# Patient Record
Sex: Male | Born: 1940
Health system: Southern US, Community
[De-identification: ages and names within clinical notes are randomized; demographics above are authoritative.]

## PROBLEM LIST (undated history)

## (undated) DIAGNOSIS — I10 Essential (primary) hypertension: Secondary | ICD-10-CM

## (undated) DIAGNOSIS — I480 Paroxysmal atrial fibrillation: Secondary | ICD-10-CM

## (undated) DIAGNOSIS — N400 Enlarged prostate without lower urinary tract symptoms: Secondary | ICD-10-CM

## (undated) DIAGNOSIS — R7989 Other specified abnormal findings of blood chemistry: Secondary | ICD-10-CM

## (undated) DIAGNOSIS — C4491 Basal cell carcinoma of skin, unspecified: Secondary | ICD-10-CM

## (undated) DIAGNOSIS — B069 Rubella without complication: Secondary | ICD-10-CM

## (undated) DIAGNOSIS — D696 Thrombocytopenia, unspecified: Secondary | ICD-10-CM

## (undated) DIAGNOSIS — D649 Anemia, unspecified: Secondary | ICD-10-CM

## (undated) DIAGNOSIS — F419 Anxiety disorder, unspecified: Secondary | ICD-10-CM

## (undated) DIAGNOSIS — E785 Hyperlipidemia, unspecified: Secondary | ICD-10-CM

## (undated) DIAGNOSIS — K219 Gastro-esophageal reflux disease without esophagitis: Secondary | ICD-10-CM

## (undated) DIAGNOSIS — M1990 Unspecified osteoarthritis, unspecified site: Secondary | ICD-10-CM

## (undated) DIAGNOSIS — T7840XA Allergy, unspecified, initial encounter: Secondary | ICD-10-CM

## (undated) DIAGNOSIS — H6120 Impacted cerumen, unspecified ear: Secondary | ICD-10-CM

## (undated) DIAGNOSIS — I639 Cerebral infarction, unspecified: Secondary | ICD-10-CM

## (undated) DIAGNOSIS — H269 Unspecified cataract: Secondary | ICD-10-CM

## (undated) DIAGNOSIS — J45909 Unspecified asthma, uncomplicated: Secondary | ICD-10-CM

## (undated) DIAGNOSIS — I4891 Unspecified atrial fibrillation: Secondary | ICD-10-CM

## (undated) DIAGNOSIS — K648 Other hemorrhoids: Secondary | ICD-10-CM

## (undated) DIAGNOSIS — F329 Major depressive disorder, single episode, unspecified: Secondary | ICD-10-CM

## (undated) DIAGNOSIS — Z0000 Encounter for general adult medical examination without abnormal findings: Secondary | ICD-10-CM

## (undated) DIAGNOSIS — G4730 Sleep apnea, unspecified: Secondary | ICD-10-CM

## (undated) DIAGNOSIS — B019 Varicella without complication: Secondary | ICD-10-CM

## (undated) DIAGNOSIS — H3530 Unspecified macular degeneration: Secondary | ICD-10-CM

## (undated) DIAGNOSIS — F411 Generalized anxiety disorder: Secondary | ICD-10-CM

## (undated) DIAGNOSIS — H6090 Unspecified otitis externa, unspecified ear: Secondary | ICD-10-CM

## (undated) DIAGNOSIS — G709 Myoneural disorder, unspecified: Secondary | ICD-10-CM

## (undated) HISTORY — DX: Rubella without complication: B06.9

## (undated) HISTORY — DX: Thrombocytopenia, unspecified: D69.6

## (undated) HISTORY — DX: Generalized anxiety disorder: F41.1

## (undated) HISTORY — DX: Sleep apnea, unspecified: G47.30

## (undated) HISTORY — DX: Other hemorrhoids: K64.8

## (undated) HISTORY — DX: Encounter for general adult medical examination without abnormal findings: Z00.00

## (undated) HISTORY — DX: Cerebral infarction, unspecified: I63.9

## (undated) HISTORY — DX: Essential (primary) hypertension: I10

## (undated) HISTORY — DX: Anxiety disorder, unspecified: F41.9

## (undated) HISTORY — DX: Unspecified cataract: H26.9

## (undated) HISTORY — DX: Paroxysmal atrial fibrillation: I48.0

## (undated) HISTORY — PX: EYE SURGERY: SHX253

## (undated) HISTORY — DX: Anemia, unspecified: D64.9

## (undated) HISTORY — DX: Unspecified asthma, uncomplicated: J45.909

## (undated) HISTORY — DX: Basal cell carcinoma of skin, unspecified: C44.91

## (undated) HISTORY — DX: Varicella without complication: B01.9

## (undated) HISTORY — DX: Allergy, unspecified, initial encounter: T78.40XA

## (undated) HISTORY — DX: Unspecified macular degeneration: H35.30

## (undated) HISTORY — DX: Major depressive disorder, single episode, unspecified: F32.9

## (undated) HISTORY — DX: Other specified abnormal findings of blood chemistry: R79.89

## (undated) HISTORY — PX: UPPER GASTROINTESTINAL ENDOSCOPY: SHX188

## (undated) HISTORY — PX: BELPHAROPTOSIS REPAIR: SHX369

## (undated) HISTORY — PX: SKIN CANCER EXCISION: SHX779

## (undated) HISTORY — DX: Impacted cerumen, unspecified ear: H61.20

## (undated) HISTORY — DX: Unspecified otitis externa, unspecified ear: H60.90

## (undated) HISTORY — DX: Unspecified osteoarthritis, unspecified site: M19.90

## (undated) HISTORY — DX: Myoneural disorder, unspecified: G70.9

## (undated) HISTORY — PX: EXCISIONAL HEMORRHOIDECTOMY: SHX1541

---

## 2010-10-07 HISTORY — PX: HYDROCELE EXCISION / REPAIR: SUR1145

## 2011-04-22 ENCOUNTER — Other Ambulatory Visit: Payer: Self-pay | Admitting: Urology

## 2011-04-22 ENCOUNTER — Encounter (HOSPITAL_COMMUNITY): Payer: Medicare Other

## 2011-04-22 ENCOUNTER — Other Ambulatory Visit (HOSPITAL_COMMUNITY): Payer: Self-pay | Admitting: Urology

## 2011-04-22 ENCOUNTER — Ambulatory Visit (HOSPITAL_COMMUNITY)
Admission: RE | Admit: 2011-04-22 | Discharge: 2011-04-22 | Disposition: A | Discharge status: Home / Self Care | Payer: Medicare Other | Source: Ambulatory Visit | Attending: Urology | Admitting: Urology

## 2011-04-22 DIAGNOSIS — Z87891 Personal history of nicotine dependence: Secondary | ICD-10-CM | POA: Insufficient documentation

## 2011-04-22 DIAGNOSIS — I10 Essential (primary) hypertension: Secondary | ICD-10-CM | POA: Insufficient documentation

## 2011-04-22 DIAGNOSIS — Z01818 Encounter for other preprocedural examination: Secondary | ICD-10-CM | POA: Insufficient documentation

## 2011-04-22 DIAGNOSIS — Z01812 Encounter for preprocedural laboratory examination: Secondary | ICD-10-CM | POA: Insufficient documentation

## 2011-04-22 DIAGNOSIS — I498 Other specified cardiac arrhythmias: Secondary | ICD-10-CM | POA: Insufficient documentation

## 2011-04-22 DIAGNOSIS — Z01810 Encounter for preprocedural cardiovascular examination: Secondary | ICD-10-CM | POA: Insufficient documentation

## 2011-04-22 LAB — CBC
MCH: 30.2 pg (ref 26.0–34.0)
MCHC: 33.3 g/dL (ref 30.0–36.0)
Platelets: 125 10*3/uL — ABNORMAL LOW (ref 150–400)

## 2011-04-22 LAB — SURGICAL PCR SCREEN
MRSA, PCR: NEGATIVE
Staphylococcus aureus: NEGATIVE

## 2011-04-22 LAB — BASIC METABOLIC PANEL
Calcium: 9.4 mg/dL (ref 8.4–10.5)
GFR calc non Af Amer: >60 mL/min (ref >60)
Sodium: 137 mEq/L (ref 135–145)

## 2011-04-23 ENCOUNTER — Ambulatory Visit (HOSPITAL_COMMUNITY)
Admission: RE | Admit: 2011-04-23 | Discharge: 2011-04-23 | Disposition: A | Discharge status: Home / Self Care | Payer: Medicare Other | Source: Ambulatory Visit | Attending: Urology | Admitting: Urology

## 2011-04-23 DIAGNOSIS — Z7982 Long term (current) use of aspirin: Secondary | ICD-10-CM | POA: Insufficient documentation

## 2011-04-23 DIAGNOSIS — Z01810 Encounter for preprocedural cardiovascular examination: Secondary | ICD-10-CM | POA: Insufficient documentation

## 2011-04-23 DIAGNOSIS — N433 Hydrocele, unspecified: Secondary | ICD-10-CM | POA: Insufficient documentation

## 2011-04-23 DIAGNOSIS — Z01812 Encounter for preprocedural laboratory examination: Secondary | ICD-10-CM | POA: Insufficient documentation

## 2011-04-23 DIAGNOSIS — I10 Essential (primary) hypertension: Secondary | ICD-10-CM | POA: Insufficient documentation

## 2011-04-23 DIAGNOSIS — Z79899 Other long term (current) drug therapy: Secondary | ICD-10-CM | POA: Insufficient documentation

## 2011-05-11 NOTE — Op Note (Signed)
  NAMECECILIA, Walter Brown NO.:  [id number]  MEDICAL RECORD NO.:  [id number]  LOCATION:  [facility]                         FACILITY:  [facility]  PHYSICIAN:  Danae Chen, M.D.  DATE OF BIRTH:  06/24/1941  DATE OF PROCEDURE:  04/23/2011 DATE OF DISCHARGE:                              OPERATIVE REPORT   PREOPERATIVE DIAGNOSIS:  Bilateral hydrocele.  POSTOPERATIVE DIAGNOSIS:  Bilateral hydrocele.  PROCEDURE DONE:  Bilateral hydrocelectomy.  SURGEON:  Danae Chen, M.D.  ANESTHESIA:  General.  INDICATIONS:  The patient is a 70 year old male who has had bilateral hydroceles for several years.  Recently, the hydrocele had been increasing in size.  They have been causing him some discomfort and he wanted to have a hydrocelectomy.  He is scheduled today for the procedure.  The patient was identified by his wristband and proper time-out was taken.  Under general anesthesia, he was prepped and draped and placed in the supine position.  A longitudinal incision was made on the right scrotum. The incision was carried down to the tunica vaginalis which was then incised.  250 mL of clear fluid were drained out of the hydrocele sac. Then, tunica vaginalis was imbricated with #3-0 chromic using the Lord's technique.  The wound was then irrigated with normal saline.  Then, the testicle was replaced in the scrotum and the wound was closed in two layers with #3-0 chromic.  Then, a longitudinal incision was made on the left scrotum.  The incision was carried down to the tunica vaginalis which was also incised.  100 mL of fluid were drained out of the hydrocele sac.  Then using the Lord's technique, the tunica vaginalis was plicated using #3-0 chromic.  The testicle was then replaced in the scrotum.  The wound was then closed with #3-0 chromic. Sterile dressing was then applied to the wound.  The patient tolerated the procedure well and left the OR in satisfactory condition to post  anesthesia care unit.     Danae Chen, M.D.     MN/MEDQ  D:  04/23/2011  T:  04/23/2011  Job:  [id number]  Electronically Signed by Lindaann Slough M.D. on 05/11/2011 07:37:31 AM

## 2012-07-02 ENCOUNTER — Emergency Department (HOSPITAL_BASED_OUTPATIENT_CLINIC_OR_DEPARTMENT_OTHER): Payer: Medicare Other

## 2012-07-02 ENCOUNTER — Encounter (HOSPITAL_BASED_OUTPATIENT_CLINIC_OR_DEPARTMENT_OTHER): Payer: Self-pay | Admitting: Emergency Medicine

## 2012-07-02 ENCOUNTER — Emergency Department (HOSPITAL_BASED_OUTPATIENT_CLINIC_OR_DEPARTMENT_OTHER)
Admission: EM | Admit: 2012-07-02 | Discharge: 2012-07-02 | Disposition: A | Discharge status: Home / Self Care | Payer: Medicare Other | Attending: Emergency Medicine | Admitting: Emergency Medicine

## 2012-07-02 DIAGNOSIS — R51 Headache: Secondary | ICD-10-CM

## 2012-07-02 DIAGNOSIS — I10 Essential (primary) hypertension: Secondary | ICD-10-CM | POA: Insufficient documentation

## 2012-07-02 HISTORY — DX: Benign prostatic hyperplasia without lower urinary tract symptoms: N40.0

## 2012-07-02 HISTORY — DX: Essential (primary) hypertension: I10

## 2012-07-02 HISTORY — DX: Hyperlipidemia, unspecified: E78.5

## 2012-07-02 LAB — URINALYSIS, ROUTINE W REFLEX MICROSCOPIC
Bilirubin Urine: NEGATIVE
Glucose, UA: NEGATIVE mg/dL
Ketones, ur: NEGATIVE mg/dL
pH: 6 (ref 5.0–8.0)

## 2012-07-02 LAB — COMPREHENSIVE METABOLIC PANEL
ALT: 18 U/L (ref 0–53)
AST: 24 U/L (ref 0–37)
Albumin: 3.9 g/dL (ref 3.5–5.2)
Alkaline Phosphatase: 68 U/L (ref 39–117)
Potassium: 3.7 mEq/L (ref 3.5–5.1)
Sodium: 139 mEq/L (ref 135–145)
Total Protein: 6.8 g/dL (ref 6.0–8.3)

## 2012-07-02 LAB — CBC WITH DIFFERENTIAL/PLATELET
Basophils Relative: 1 % (ref 0–1)
Eosinophils Absolute: 0.1 10*3/uL (ref 0.0–0.7)
Lymphs Abs: 1.6 10*3/uL (ref 0.7–4.0)
MCH: 30.8 pg (ref 26.0–34.0)
Neutrophils Relative %: 73 % (ref 43–77)
Platelets: 137 10*3/uL — ABNORMAL LOW (ref 150–400)
RBC: 4.35 MIL/uL (ref 4.22–5.81)

## 2012-07-02 NOTE — ED Provider Notes (Signed)
History     CSN: [id number]  Arrival date & time 07/02/12  1500   First MD Initiated Contact with Patient 07/02/12 1521      Chief Complaint  Patient presents with  . Hypertension     HPI  The patient p/w concerns of HTN.  He has been in his [facility] prior to this AM.  He awoke feeling slightly "disoriented."  (~8hr pta). A BP check was abnormally high.  He went to the gym, completed a strenuous workout, and on recheck his BP was normal.  During this period he became aware of a mild HA (diffuse, sore) with no visual changes / dysequilibrium / weakness or other focal changes.  Just PTA he checked his BP and again it was elevated.   Past Medical History  Diagnosis Date  . Hypertension   . Hyperlipidemia   . BPH (benign prostatic hyperplasia)     Past Surgical History  Procedure Date  . Hydrocele excision / repair     No family history on file.  History  Substance Use Topics  . Smoking status: Not on file  . Smokeless tobacco: Not on file  . Alcohol Use: Yes      Review of Systems  Constitutional:       Per HPI, otherwise negative  HENT:       Per HPI, otherwise negative  Eyes: Negative.   Respiratory:       Per HPI, otherwise negative  Cardiovascular:       Per HPI, otherwise negative  Gastrointestinal: Negative for vomiting.  Genitourinary: Negative.   Musculoskeletal:       Per HPI, otherwise negative  Skin: Negative.   Neurological: Positive for headaches. Negative for dizziness, tremors, seizures, syncope, facial asymmetry, speech difficulty, weakness, light-headedness and numbness.    Allergies  Review of patient's allergies indicates no known allergies.  Home Medications   Current Outpatient Rx  Name Route Sig Dispense Refill  . ESCITALOPRAM OXALATE 20 MG PO TABS Oral Take 20 mg by mouth daily.    . TYROSINE PO Oral Take 20 mg by mouth.      BP 155/94  Pulse 87  Temp 98.3 F (36.8 C) (Oral)  Resp 16  Ht 5\' 6"  (1.676 m)  Wt 180 lb (81.647 kg)   BMI 29.05 kg/m2  SpO2 100%  Physical Exam  Nursing note and vitals reviewed. Constitutional: He is oriented to person, place, and time. He appears well-developed. No distress.  HENT:  Head: Normocephalic and atraumatic.  Eyes: Conjunctivae normal and EOM are normal.  Cardiovascular: Normal rate and regular rhythm.   Pulmonary/Chest: Effort normal. No stridor. No respiratory distress.  Abdominal: He exhibits no distension.  Musculoskeletal: He exhibits no edema.  Neurological: He is alert and oriented to person, place, and time. He displays no atrophy and no tremor. No cranial nerve deficit. He exhibits normal muscle tone. He displays no seizure activity. Coordination and gait normal.  Skin: Skin is warm and dry.  Psychiatric: He has a normal mood and affect.    ED Course  Procedures (including critical care time)  Labs Reviewed  CBC WITH DIFFERENTIAL - Abnormal; Notable for the following:    HCT 38.2 (*)     Platelets 137 (*)     All other components within normal limits  COMPREHENSIVE METABOLIC PANEL  URINALYSIS, ROUTINE W REFLEX MICROSCOPIC   Dg Chest 2 View  07/02/2012  *RADIOLOGY REPORT*  Clinical Data: Discomfort, hypertension.  CHEST - 2 VIEW  Comparison: 04/22/2011  Findings: Mild tortuosity of the thoracic aorta. Heart and mediastinal contours are within normal limits.  No focal opacities or effusions.  No acute bony abnormality.  IMPRESSION: No active cardiopulmonary disease.   Original Report Authenticated By: Cyndie Chime, M.D.      No diagnosis found.   Date: 07/02/2012  Rate: 72  Rhythm: sinus arrhythmia  QRS Axis: normal  Intervals: normal  ST/T Wave abnormalities: normal  Conduction Disutrbances:none  Narrative Interpretation:   Old EKG Reviewed: changes noted ABNORMAL  Repeat blood pressure is 160 systolic MDM  This generally well-appearing male presents with concerns of hypertension and a new headache.  The patient's generally good health, the  absence of distress, the absence of focal neuro findings is all reassuring.  However, given his description of abnormally elevated blood pressure there is some suspicion of acute hypertensive urgency.  Patient's labs, CAT scan, radiographic studies were unremarkable, and he remained in no distress throughout his ED stay.  The patient has a followup appointment scheduled in 15 hours.  Given this, the absence of distress or notable findings, the patient was discharged in stable condition with return precautions, next a followup.   Gerhard Munch, MD 07/02/12 [phone]

## 2012-07-02 NOTE — ED Notes (Signed)
States has been having elevated b/p's recently.  This am was 176/92 so he went to work out.  It came down to 128/88 but went right back up soon after lunch.

## 2012-07-02 NOTE — ED Notes (Signed)
Pt c/o of a slight headache. Denies dizziness, n/v.

## 2012-07-03 ENCOUNTER — Ambulatory Visit (INDEPENDENT_AMBULATORY_CARE_PROVIDER_SITE_OTHER): Payer: Medicare Other | Admitting: Family Medicine

## 2012-07-03 ENCOUNTER — Encounter: Payer: Self-pay | Admitting: Family Medicine

## 2012-07-03 VITALS — BP 164/90 | HR 66 | Temp 97.8°F | Ht 66.0 in | Wt 183.0 lb

## 2012-07-03 DIAGNOSIS — I10 Essential (primary) hypertension: Secondary | ICD-10-CM

## 2012-07-03 DIAGNOSIS — E785 Hyperlipidemia, unspecified: Secondary | ICD-10-CM

## 2012-07-03 DIAGNOSIS — C801 Malignant (primary) neoplasm, unspecified: Secondary | ICD-10-CM | POA: Insufficient documentation

## 2012-07-03 DIAGNOSIS — C4491 Basal cell carcinoma of skin, unspecified: Secondary | ICD-10-CM | POA: Insufficient documentation

## 2012-07-03 DIAGNOSIS — J45909 Unspecified asthma, uncomplicated: Secondary | ICD-10-CM

## 2012-07-03 DIAGNOSIS — N400 Enlarged prostate without lower urinary tract symptoms: Secondary | ICD-10-CM

## 2012-07-03 DIAGNOSIS — H6690 Otitis media, unspecified, unspecified ear: Secondary | ICD-10-CM

## 2012-07-03 DIAGNOSIS — D696 Thrombocytopenia, unspecified: Secondary | ICD-10-CM | POA: Insufficient documentation

## 2012-07-03 DIAGNOSIS — D649 Anemia, unspecified: Secondary | ICD-10-CM

## 2012-07-03 MED ORDER — HYDROCHLOROTHIAZIDE 25 MG PO TABS: 25.0000 mg | ORAL_TABLET | Freq: Every day | ORAL | Status: DC

## 2012-07-03 MED ORDER — AMOXICILLIN-POT CLAVULANATE 875-125 MG PO TABS: 1.0000 | ORAL_TABLET | Freq: Two times a day (BID) | ORAL | Status: DC

## 2012-07-03 NOTE — Patient Instructions (Addendum)
Preventive Care for Adults, Male A healthy lifestyle and preventative care can promote health and wellness. Preventative health guidelines for men include the following key practices:  A routine yearly physical is a good way to check with your caregiver about your health and preventative screening. It is a chance to share any concerns and updates on your health, and to receive a thorough exam.   Visit your dentist for a routine exam and preventative care every 6 months. Brush your teeth twice a day and floss once a day. Good oral hygiene prevents tooth decay and gum disease.   The frequency of eye exams is based on your age, health, family medical history, use of contact lenses, and other factors. Follow your caregiver's recommendations for frequency of eye exams.   Eat a healthy diet. Foods like vegetables, fruits, whole grains, low-fat dairy products, and lean protein foods contain the nutrients you need without too many calories. Decrease your intake of foods high in solid fats, added sugars, and salt. Eat the right amount of calories for you.Get information about a proper diet from your caregiver, if necessary.   Regular physical exercise is one of the most important things you can do for your health. Most adults should get at least 150 minutes of moderate-intensity exercise (any activity that increases your heart rate and causes you to sweat) each week. In addition, most adults need muscle-strengthening exercises on 2 or more days a week.   Maintain a healthy weight. The body mass index (BMI) is a screening tool to identify possible weight problems. It provides an estimate of body fat based on height and weight. Your caregiver can help determine your BMI, and can help you achieve or maintain a healthy weight.For adults 20 years and older:   A BMI below 18.5 is considered underweight.   A BMI of 18.5 to 24.9 is normal.   A BMI of 25 to 29.9 is considered overweight.   A BMI of 30 and above  is considered obese.   Maintain normal blood lipids and cholesterol levels by exercising and minimizing your intake of saturated fat. Eat a balanced diet with plenty of fruit and vegetables. Blood tests for lipids and cholesterol should begin at age 20 and be repeated every 5 years. If your lipid or cholesterol levels are high, you are over 50, or you are a high risk for heart disease, you may need your cholesterol levels checked more frequently.Ongoing high lipid and cholesterol levels should be treated with medicines if diet and exercise are not effective.   If you smoke, find out from your caregiver how to quit. If you do not use tobacco, do not start.   If you choose to drink alcohol, do not exceed 2 drinks per day. One drink is considered to be 12 ounces (355 mL) of beer, 5 ounces (148 mL) of wine, or 1.5 ounces (44 mL) of liquor.   Avoid use of street drugs. Do not share needles with anyone. Ask for help if you need support or instructions about stopping the use of drugs.   High blood pressure causes heart disease and increases the risk of stroke. Your blood pressure should be checked at least every 1 to 2 years. Ongoing high blood pressure should be treated with medicines, if weight loss and exercise are not effective.   If you are 45 to 71 years old, ask your caregiver if you should take aspirin to prevent heart disease.   Diabetes screening involves taking a blood   sample to check your fasting blood sugar level. This should be done once every 3 years, after age 45, if you are within normal weight and without risk factors for diabetes. Testing should be considered at a younger age or be carried out more frequently if you are overweight and have at least 1 risk factor for diabetes.   Colorectal cancer can be detected and often prevented. Most routine colorectal cancer screening begins at the age of 50 and continues through age 75. However, your caregiver may recommend screening at an earlier  age if you have risk factors for colon cancer. On a yearly basis, your caregiver may provide home test kits to check for hidden blood in the stool. Use of a small camera at the end of a tube, to directly examine the colon (sigmoidoscopy or colonoscopy), can detect the earliest forms of colorectal cancer. Talk to your caregiver about this at age 50, when routine screening begins. Direct examination of the colon should be repeated every 5 to 10 years through age 75, unless early forms of pre-cancerous polyps or small growths are found.   Hepatitis C blood testing is recommended for all people born from 1945 through 1965 and any individual with known risks for hepatitis C.   Practice safe sex. Use condoms and avoid high-risk sexual practices to reduce the spread of sexually transmitted infections (STIs). STIs include gonorrhea, chlamydia, syphilis, trichomonas, herpes, HPV, and human immunodeficiency virus (HIV). Herpes, HIV, and HPV are viral illnesses that have no cure. They can result in disability, cancer, and death.   A one-time screening for abdominal aortic aneurysm (AAA) and surgical repair of large AAAs by sound wave imaging (ultrasonography) is recommended for ages 65 to 75 years who are current or former smokers.   Healthy men should no longer receive prostate-specific antigen (PSA) blood tests as part of routine cancer screening. Consult with your caregiver about prostate cancer screening.   Testicular cancer screening is not recommended for adult males who have no symptoms. Screening includes self-exam, caregiver exam, and other screening tests. Consult with your caregiver about any symptoms you have or any concerns you have about testicular cancer.   Use sunscreen with skin protection factor (SPF) of 30 or more. Apply sunscreen liberally and repeatedly throughout the day. You should seek shade when your shadow is shorter than you. Protect yourself by wearing long sleeves, pants, a  wide-brimmed hat, and sunglasses year round, whenever you are outdoors.   Once a month, do a whole body skin exam, using a mirror to look at the skin on your back. Notify your caregiver of new moles, moles that have irregular borders, moles that are larger than a pencil eraser, or moles that have changed in shape or color.   Stay current with required immunizations.   Influenza. You need a dose every fall (or winter). The composition of the flu vaccine changes each year, so being vaccinated once is not enough.   Pneumococcal polysaccharide. You need 1 to 2 doses if you smoke cigarettes or if you have certain chronic medical conditions. You need 1 dose at age 65 (or older) if you have never been vaccinated.   Tetanus, diphtheria, pertussis (Tdap, Td). Get 1 dose of Tdap vaccine if you are younger than age 65 years, are over 65 and have contact with an infant, are a healthcare worker, or simply want to be protected from whooping cough. After that, you need a Td booster dose every 10 years. Consult your caregiver if   you have not had at least 3 tetanus and diphtheria-containing shots sometime in your life or have a deep or dirty wound.   HPV. This vaccine is recommended for males 13 through 71 years of age. This vaccine may be given to men 22 through 71 years of age who have not completed the 3 dose series. It is recommended for men through age 26 who have sex with men or whose immune system is weakened because of HIV infection, other illness, or medications. The vaccine is given in 3 doses over 6 months.   Measles, mumps, rubella (MMR). You need at least 1 dose of MMR if you were born in 1957 or later. You may also need a 2nd dose.   Meningococcal. If you are age 19 to 21 years and a first-year college student living in a residence hall, or have one of several medical conditions, you need to get vaccinated against meningococcal disease. You may also need additional booster doses.   Zoster (shingles).  If you are age 60 years or older, you should get this vaccine.   Varicella (chickenpox). If you have never had chickenpox or you were vaccinated but received only 1 dose, talk to your caregiver to find out if you need this vaccine.   Hepatitis A. You need this vaccine if you have a specific risk factor for hepatitis A virus infection, or you simply wish to be protected from this disease. The vaccine is usually given as 2 doses, 6 to 18 months apart.   Hepatitis B. You need this vaccine if you have a specific risk factor for hepatitis B virus infection or you simply wish to be protected from this disease. The vaccine is given in 3 doses, usually over 6 months.  Preventative Service / Frequency Ages 19 to 39  Blood pressure check.** / Every 1 to 2 years.   Lipid and cholesterol check.** / Every 5 years beginning at age 20.   Hepatitis C blood test.** / For any individual with known risks for hepatitis C.   Skin self-exam. / Monthly.   Influenza immunization.** / Every year.   Pneumococcal polysaccharide immunization.** / 1 to 2 doses if you smoke cigarettes or if you have certain chronic medical conditions.   Tetanus, diphtheria, pertussis (Tdap,Td) immunization. / A one-time dose of Tdap vaccine. After that, you need a Td booster dose every 10 years.   HPV immunization. / 3 doses over 6 months, if 26 and younger.   Measles, mumps, rubella (MMR) immunization. / You need at least 1 dose of MMR if you were born in 1957 or later. You may also need a 2nd dose.   Meningococcal immunization. / 1 dose if you are age 19 to 21 years and a first-year college student living in a residence hall, or have one of several medical conditions, you need to get vaccinated against meningococcal disease. You may also need additional booster doses.   Varicella immunization.** / Consult your caregiver.   Hepatitis A immunization.** / Consult your caregiver. 2 doses, 6 to 18 months apart.   Hepatitis B  immunization.** / Consult your caregiver. 3 doses usually over 6 months.  Ages 40 to 64  Blood pressure check.** / Every 1 to 2 years.   Lipid and cholesterol check.** / Every 5 years beginning at age 20.   Fecal occult blood test (FOBT) of stool. / Every year beginning at age 50 and continuing until age 75. You may not have to do this test if   you get colonoscopy every 10 years.   Flexible sigmoidoscopy** or colonoscopy.** / Every 5 years for a flexible sigmoidoscopy or every 10 years for a colonoscopy beginning at age 58 and continuing until age 50.   Hepatitis C blood test.** / For all people born from [birth year] through 1965 and any individual with known risks for hepatitis C.   Skin self-exam. / Monthly.   Influenza immunization.** / Every year.   Pneumococcal polysaccharide immunization.** / 1 to 2 doses if you smoke cigarettes or if you have certain chronic medical conditions.   Tetanus, diphtheria, pertussis (Tdap/Td) immunization.** / A one-time dose of Tdap vaccine. After that, you need a Td booster dose every 10 years.   Measles, mumps, rubella (MMR) immunization. / You need at least 1 dose of MMR if you were born in 1957 or later. You may also need a 2nd dose.   Varicella immunization.**/ Consult your caregiver.   Meningococcal immunization.** / Consult your caregiver.   Hepatitis A immunization.** / Consult your caregiver. 2 doses, 6 to 18 months apart.   Hepatitis B immunization.** / Consult your caregiver. 3 doses, usually over 6 months.  Ages 98 and over  Blood pressure check.** / Every 1 to 2 years.   Lipid and cholesterol check.**/ Every 5 years beginning at age 71.   Fecal occult blood test (FOBT) of stool. / Every year beginning at age 53 and continuing until age 28. You may not have to do this test if you get colonoscopy every 10 years.   Flexible sigmoidoscopy** or colonoscopy.** / Every 5 years for a flexible sigmoidoscopy or every 10 years for a colonoscopy  beginning at age 92 and continuing until age 76.   Hepatitis C blood test.** / For all people born from [birth year] through 1965 and any individual with known risks for hepatitis C.   Abdominal aortic aneurysm (AAA) screening.** / A one-time screening for ages 19 to 52 years who are current or former smokers.   Skin self-exam. / Monthly.   Influenza immunization.** / Every year.   Pneumococcal polysaccharide immunization.** / 1 dose at age 80 (or older) if you have never been vaccinated.   Tetanus, diphtheria, pertussis (Tdap, Td) immunization. / A one-time dose of Tdap vaccine if you are over 65 and have contact with an infant, are a Research scientist (physical sciences), or simply want to be protected from whooping cough. After that, you need a Td booster dose every 10 years.   Varicella immunization. ** / Consult your caregiver.   Meningococcal immunization.** / Consult your caregiver.   Hepatitis A immunization. ** / Consult your caregiver. 2 doses, 6 to 18 months apart.   Hepatitis B immunization.** / Check with your caregiver. 3 doses, usually over 6 months.  **Family history and personal history of risk and conditions may change your caregiver's recommendations. Document Released: 11/19/2001 Document Revised: 09/12/2011 Document Reviewed: 02/18/2011 [facility] Patient Information 2012 [place], [place].   Think about sleep study Start Probiotic such Digestive [employer] by [employer]. Align is good

## 2012-07-03 NOTE — Progress Notes (Signed)
Quick Note:  Patient Informed and voiced understanding ______ 

## 2012-07-05 ENCOUNTER — Encounter: Payer: Self-pay | Admitting: Family Medicine

## 2012-07-05 DIAGNOSIS — N400 Enlarged prostate without lower urinary tract symptoms: Secondary | ICD-10-CM | POA: Insufficient documentation

## 2012-07-05 DIAGNOSIS — J45909 Unspecified asthma, uncomplicated: Secondary | ICD-10-CM | POA: Insufficient documentation

## 2012-07-05 DIAGNOSIS — E785 Hyperlipidemia, unspecified: Secondary | ICD-10-CM | POA: Insufficient documentation

## 2012-07-05 DIAGNOSIS — J989 Respiratory disorder, unspecified: Secondary | ICD-10-CM | POA: Insufficient documentation

## 2012-07-05 DIAGNOSIS — D649 Anemia, unspecified: Secondary | ICD-10-CM | POA: Insufficient documentation

## 2012-07-05 DIAGNOSIS — I10 Essential (primary) hypertension: Secondary | ICD-10-CM | POA: Insufficient documentation

## 2012-07-05 NOTE — Progress Notes (Signed)
Patient ID: Walter Brown, male   DOB: Sep 11, 1941, 71 y.o.   MRN: [id number] Walter Brown [id number] July 24, 1941 07/05/2012      Progress Note New Patient  Subjective  Chief Complaint  Chief Complaint  Patient presents with  . Establish Care    new pt  . Hypertension    BP has been running high    HPI  Patient is a 71 year old Caucasian male who is in today for new patient appointment. He has recently returned from a trip to person and his wife and when he returned he had a lot of head and chest congestion on September 10 he did start a Z-Pak. He says it was somewhat helpful but in the last 2 days she's had increase in blood pressure as well as head congestion and a sense of lightheadedness. Walter Brown he presented to the ER yesterday with a blood pressure of 176/98 and fortunately CAT scan of the head was clear but unfortunately his blood pressure remains elevated. He denies chest pain, palpitations, shortness of breath, GI or GU complaints today. He reports his last colonoscopy was done a couple of years ago and he's always had a history of normal colonoscopy.  Past Medical History  Diagnosis Date  . BPH (benign prostatic hyperplasia)   . Chicken pox as child  . [place] measles as a child  . Cancer     skin- on nose- basal cell (20 yrs ago) forehead 1 year ago  . Thrombocytopenia   . Anemia     mild  . Asthma     childhood  . Hyperlipidemia   . Hypertension     Past Surgical History  Procedure Date  . Hydrocele excision / repair 2012    b/l  . Skin cancer excision     nose and forehead    Family History  Problem Relation Age of Onset  . Hypertension Mother   . Other Mother     stents  . Heart disease Mother   . Parkinsonism Father   . Heart disease Father   . Cancer Father 44    prostate  . Hypertension Father   . Cancer Maternal Grandfather     prostate ?  . ADD / ADHD Son     ADHD  . Other Son 24    part of 1 lung removed- due to infection  . Stroke Maternal  Grandmother   . Cancer Paternal Grandfather     History   Social History  . Marital Status: Married    Spouse Name: N/A    Number of Children: N/A  . Years of Education: N/A   Occupational History  . Not on file.   Social History Main Topics  . Smoking status: Former Smoker -- 1.5 packs/day for 15 years    Start date: 10/07/1978  . Smokeless tobacco: Never Used  . Alcohol Use: Yes     2 glasses of wine daily  . Drug Use: No  . Sexually Active: Yes   Other Topics Concern  . Not on file   Social History Narrative  . No narrative on file    Current Outpatient Prescriptions on File Prior to Visit  Medication Sig Dispense Refill  . simvastatin (ZOCOR) 20 MG tablet Take 20 mg by mouth every evening.      . terazosin (HYTRIN) 5 MG capsule Take 5 mg by mouth daily.      . hydrochlorothiazide (HYDRODIURIL) 25 MG tablet Take 1 tablet (25 mg total) by  mouth daily.  90 tablet  3    No Known Allergies  Review of Systems  Review of Systems  Constitutional: Positive for malaise/fatigue. Negative for fever and chills.  HENT: Positive for congestion. Negative for hearing loss, nosebleeds and sore throat.   Eyes: Negative for discharge.  Respiratory: Negative for cough, sputum production, shortness of breath and wheezing.   Cardiovascular: Negative for chest pain, palpitations and leg swelling.  Gastrointestinal: Negative for heartburn, nausea, vomiting, abdominal pain, diarrhea, constipation and blood in stool.  Genitourinary: Negative for dysuria, urgency, frequency and hematuria.  Musculoskeletal: Negative for myalgias, back pain and falls.  Skin: Negative for rash.  Neurological: Negative for dizziness, tremors, sensory change, focal weakness, loss of consciousness, weakness and headaches.  Endo/Heme/Allergies: Negative for polydipsia. Does not bruise/bleed easily.  Psychiatric/Behavioral: Negative for depression and suicidal ideas. The patient is not nervous/anxious and does  not have insomnia.     Objective  BP 175/76  Pulse 66  Temp 97.8 F (36.6 C) (Temporal)  Ht 5\' 6"  (1.676 m)  Wt 183 lb (83.008 kg)  BMI 29.54 kg/m2  SpO2 97%  Physical Exam  Physical Exam  Constitutional: He is oriented to person, place, and time and well-developed, well-nourished, and in no distress. No distress.  HENT:  Head: Normocephalic and atraumatic.  Mouth/Throat: Oropharynx is clear and moist.       Nasal mucosa boggy and erythematous.  Eyes: Conjunctivae normal and EOM are normal. Right eye exhibits no discharge. Left eye exhibits no discharge.  Neck: Neck supple. No thyromegaly present.  Cardiovascular: Normal rate, regular rhythm and normal heart sounds.   No murmur heard. Pulmonary/Chest: Effort normal and breath sounds normal. No respiratory distress.  Abdominal: Soft. Bowel sounds are normal. He exhibits no distension and no mass. There is no tenderness.  Musculoskeletal: He exhibits no edema.  Neurological: He is alert and oriented to person, place, and time. He has normal reflexes. No cranial nerve deficit. Gait normal.  Skin: Skin is warm.  Psychiatric: Memory, affect and judgment normal.       Assessment & Plan  Asthma Recent bronchitis treated with zpak now with some persistent cough. Given a paper copy of Augmentin to use if symptoms worsen again, encouraged Mucinex id, increased rest and hydration  BPH (benign prostatic hyperplasia) Doing well on Hytrin  Anemia Encouraged increased leafy greens, red meats, cook in cast iron. Will monitor  Thrombocytopenia Mild intermittent, will monitor  Hypertension Given DASH diet handout and will monitor  Hyperlipidemia Will request old records. Continue Zocor at 20 mg daily. No changes today

## 2012-07-06 ENCOUNTER — Encounter: Payer: Self-pay | Admitting: Family Medicine

## 2012-07-06 NOTE — Assessment & Plan Note (Addendum)
Doing well on Hytrin

## 2012-07-06 NOTE — Assessment & Plan Note (Signed)
Given DASH diet handout and will monitor

## 2012-07-06 NOTE — Assessment & Plan Note (Signed)
Recent bronchitis treated with zpak now with some persistent cough. Given a paper copy of Augmentin to use if symptoms worsen again, encouraged Mucinex id, increased rest and hydration

## 2012-07-06 NOTE — Assessment & Plan Note (Signed)
Mild intermittent, will monitor

## 2012-07-06 NOTE — Assessment & Plan Note (Signed)
Will request old records. Continue Zocor at 20 mg daily. No changes today

## 2012-07-06 NOTE — Assessment & Plan Note (Signed)
Encouraged increased leafy greens, red meats, cook in cast iron. Will monitor

## 2012-07-31 ENCOUNTER — Encounter: Payer: Self-pay | Admitting: Family Medicine

## 2012-07-31 ENCOUNTER — Ambulatory Visit (INDEPENDENT_AMBULATORY_CARE_PROVIDER_SITE_OTHER): Payer: Medicare Other | Admitting: Family Medicine

## 2012-07-31 VITALS — BP 148/78 | HR 60 | Temp 98.2°F | Ht 66.0 in | Wt 184.1 lb

## 2012-07-31 DIAGNOSIS — D696 Thrombocytopenia, unspecified: Secondary | ICD-10-CM

## 2012-07-31 DIAGNOSIS — E785 Hyperlipidemia, unspecified: Secondary | ICD-10-CM

## 2012-07-31 DIAGNOSIS — F329 Major depressive disorder, single episode, unspecified: Secondary | ICD-10-CM

## 2012-07-31 DIAGNOSIS — F32A Depression, unspecified: Secondary | ICD-10-CM

## 2012-07-31 DIAGNOSIS — F3289 Other specified depressive episodes: Secondary | ICD-10-CM

## 2012-07-31 DIAGNOSIS — Z0000 Encounter for general adult medical examination without abnormal findings: Secondary | ICD-10-CM

## 2012-07-31 DIAGNOSIS — I10 Essential (primary) hypertension: Secondary | ICD-10-CM

## 2012-07-31 LAB — RENAL FUNCTION PANEL
CO2: 31 mEq/L (ref 19–32)
Calcium: 9.1 mg/dL (ref 8.4–10.5)
GFR: 69.35 mL/min (ref >60.00)
Glucose, Bld: 90 mg/dL (ref 70–99)
Potassium: 3.7 mEq/L (ref 3.5–5.1)
Sodium: 135 mEq/L (ref 135–145)

## 2012-07-31 NOTE — Patient Instructions (Addendum)

## 2012-08-01 ENCOUNTER — Encounter: Payer: Self-pay | Admitting: Family Medicine

## 2012-08-01 DIAGNOSIS — F329 Major depressive disorder, single episode, unspecified: Secondary | ICD-10-CM

## 2012-08-01 DIAGNOSIS — Z0000 Encounter for general adult medical examination without abnormal findings: Secondary | ICD-10-CM | POA: Insufficient documentation

## 2012-08-01 HISTORY — DX: Major depressive disorder, single episode, unspecified: F32.9

## 2012-08-01 NOTE — Assessment & Plan Note (Signed)
Improved on repeat check 

## 2012-08-01 NOTE — Assessment & Plan Note (Signed)
Patient reports Zostavax, Tdap and pneumovax are up to date

## 2012-08-01 NOTE — Assessment & Plan Note (Signed)
Doing well on Zocor no changes.

## 2012-08-01 NOTE — Progress Notes (Signed)
Patient ID: Walter Brown, male   DOB: Feb 24, 1941, 71 y.o.   MRN: [id number] Walter Brown [id number] 06-Jan-1941 08/01/2012      Progress Note-Follow Up  Subjective  Chief Complaint  Chief Complaint  Patient presents with  . Follow-up    4 week     HPI  Patient is a 71 year old Caucasian male who is in today for followup. He recently established care reports since his first visit he's had no new concerns. No recent illness, headache, fevers, chills, chest pain or palpitations, shortness of breath, GI or GU complaints. Reports he is up-to-date with Zostavax, Tdap vaccine.  Past Medical History  Diagnosis Date  . BPH (benign prostatic hyperplasia)   . Chicken pox as child  . [place] measles as a child  . Cancer     skin- on nose- basal cell (20 yrs ago) forehead 1 year ago  . Thrombocytopenia   . Anemia     mild  . Asthma     childhood  . Hyperlipidemia   . Hypertension   . Preventative health care 08/01/2012  . Depression 08/01/2012    Past Surgical History  Procedure Date  . Hydrocele excision / repair 2012    b/l  . Skin cancer excision     nose and forehead  . Belpharoptosis repair     very young, b/l    Family History  Problem Relation Age of Onset  . Hypertension Mother   . Other Mother     stents  . Heart disease Mother   . Parkinsonism Father   . Heart disease Father   . Cancer Father 22    prostate  . Hypertension Father   . Cancer Maternal Grandfather     prostate ?  . ADD / ADHD Son     ADHD  . Other Son 24    part of 1 lung removed- due to infection  . Stroke Maternal Grandmother   . Cancer Paternal Grandfather     History   Social History  . Marital Status: Married    Spouse Name: N/A    Number of Children: N/A  . Years of Education: N/A   Occupational History  . Not on file.   Social History Main Topics  . Smoking status: Former Smoker -- 1.5 packs/day for 15 years    Start date: 10/07/1978  . Smokeless tobacco: Never Used  .  Alcohol Use: Yes     2 glasses of wine daily  . Drug Use: No  . Sexually Active: Yes   Other Topics Concern  . Not on file   Social History Narrative  . No narrative on file    Current Outpatient Prescriptions on File Prior to Visit  Medication Sig Dispense Refill  . aspirin 81 MG tablet Take 81 mg by mouth daily.      Marland Kitchen escitalopram (LEXAPRO) 10 MG tablet Take 10 mg by mouth daily.      . fish oil-omega-3 fatty acids 1000 MG capsule Take 2 g by mouth daily.      . hydrochlorothiazide (HYDRODIURIL) 25 MG tablet Take 1 tablet (25 mg total) by mouth daily.  90 tablet  3  . NON FORMULARY Occulite      . simvastatin (ZOCOR) 20 MG tablet Take 20 mg by mouth every evening.      . terazosin (HYTRIN) 5 MG capsule Take 5 mg by mouth daily.        No Known Allergies  Review of Systems  Review of Systems  Constitutional: Negative for fever and malaise/fatigue.  HENT: Negative for congestion.   Eyes: Negative for discharge.  Respiratory: Negative for shortness of breath.   Cardiovascular: Negative for chest pain, palpitations and leg swelling.  Gastrointestinal: Negative for nausea, abdominal pain and diarrhea.  Genitourinary: Negative for dysuria.  Musculoskeletal: Negative for falls.  Skin: Negative for rash.  Neurological: Negative for loss of consciousness and headaches.  Endo/Heme/Allergies: Negative for polydipsia.  Psychiatric/Behavioral: Negative for depression and suicidal ideas. The patient is not nervous/anxious and does not have insomnia.     Objective  BP 148/78  Pulse 60  Temp 98.2 F (36.8 C) (Temporal)  Ht 5\' 6"  (1.676 m)  Wt 184 lb 1.9 oz (83.516 kg)  BMI 29.72 kg/m2  SpO2 99%  Physical Exam  Physical Exam  Constitutional: He is oriented to person, place, and time and well-developed, well-nourished, and in no distress. No distress.  HENT:  Head: Normocephalic and atraumatic.  Eyes: Conjunctivae normal are normal.  Neck: Neck supple. No thyromegaly  present.  Cardiovascular: Normal rate, regular rhythm and normal heart sounds.   No murmur heard. Pulmonary/Chest: Effort normal and breath sounds normal. No respiratory distress.  Abdominal: He exhibits no distension and no mass. There is no tenderness.  Musculoskeletal: He exhibits no edema.  Neurological: He is alert and oriented to person, place, and time.  Skin: Skin is warm.  Psychiatric: Memory, affect and judgment normal.    Lab Results  Component Value Date   TSH 1.27 07/03/2012   Lab Results  Component Value Date   WBC 8.7 07/02/2012   HGB 13.4 07/02/2012   HCT 38.2* 07/02/2012   MCV 87.8 07/02/2012   PLT 137* 07/02/2012   Lab Results  Component Value Date   CREATININE 1.1 07/31/2012   BUN 15 07/31/2012   NA 135 07/31/2012   K 3.7 07/31/2012   CL 98 07/31/2012   CO2 31 07/31/2012   Lab Results  Component Value Date   ALT 18 07/02/2012   AST 24 07/02/2012   ALKPHOS 68 07/02/2012   BILITOT 0.6 07/02/2012     Assessment & Plan  Preventative health care Patient reports Zostavax, Tdap and pneumovax are up to date  Hypertension Improved on repeat check  Depression Tolerating Lexapro has been under a great deal of stress, he is aware we can increase Lexapro if needed  Thrombocytopenia Mild, improved.  Hyperlipidemia Doing well on Zocor no changes.

## 2012-08-01 NOTE — Assessment & Plan Note (Signed)
Tolerating Lexapro has been under a great deal of stress, he is aware we can increase Lexapro if needed

## 2012-08-01 NOTE — Assessment & Plan Note (Signed)
Mild, improved. 

## 2012-08-09 ENCOUNTER — Encounter: Payer: Self-pay | Admitting: Family Medicine

## 2012-08-10 ENCOUNTER — Encounter: Payer: Self-pay | Admitting: Family Medicine

## 2012-08-10 ENCOUNTER — Ambulatory Visit (INDEPENDENT_AMBULATORY_CARE_PROVIDER_SITE_OTHER): Payer: Medicare Other | Admitting: Family Medicine

## 2012-08-10 VITALS — BP 118/84 | HR 56 | Temp 98.6°F | Ht 66.0 in | Wt 183.0 lb

## 2012-08-10 DIAGNOSIS — H6120 Impacted cerumen, unspecified ear: Secondary | ICD-10-CM

## 2012-08-10 DIAGNOSIS — H6090 Unspecified otitis externa, unspecified ear: Secondary | ICD-10-CM

## 2012-08-10 DIAGNOSIS — H60399 Other infective otitis externa, unspecified ear: Secondary | ICD-10-CM

## 2012-08-10 DIAGNOSIS — I10 Essential (primary) hypertension: Secondary | ICD-10-CM

## 2012-08-10 HISTORY — DX: Impacted cerumen, unspecified ear: H61.20

## 2012-08-10 HISTORY — DX: Unspecified otitis externa, unspecified ear: H60.90

## 2012-08-10 MED ORDER — NEOMYCIN-POLYMYXIN-HC 3.5-10000-1 OT SOLN: 3.0000 [drp] | Freq: Four times a day (QID) | OTIC | Status: DC

## 2012-08-10 NOTE — Progress Notes (Signed)
Patient ID: Walter Brown, male   DOB: 27-Jan-1941, 71 y.o.   MRN: [id number] Walter Brown [id number] 1941/07/07 08/10/2012      Progress Note-Follow Up  Subjective  Chief Complaint  Chief Complaint  Patient presents with  . Follow-up    BP check  . Cerumen Impaction    both ears    HPI  Patient is a 71 year old Caucasian male who is in today for evaluation of cerumen impaction and blood pressure. He notes yesterday he checked his blood pressure it was 146/97 but he generally checks it it is 130/80. He does note a dull mild headache but no other complaints. No congestion or fevers. No sore throat or chest pain. No palpitations or shortness of breath. He does not excessive ear wax and is frustrated because he is having trouble hearing at this time. No pain or itching is associated no tinnitus  Past Medical History  Diagnosis Date  . BPH (benign prostatic hyperplasia)   . Chicken pox as child  . [place] measles as a child  . Cancer     skin- on nose- basal cell (20 yrs ago) forehead 1 year ago  . Thrombocytopenia   . Anemia     mild  . Asthma     childhood  . Hyperlipidemia   . Hypertension   . Preventative health care 08/01/2012  . Depression 08/01/2012  . Cerumen impaction 08/10/2012  . Otitis externa 08/10/2012     Past Surgical History  Procedure Date  . Hydrocele excision / repair 2012    b/l  . Skin cancer excision     nose and forehead  . Belpharoptosis repair     very young, b/l    Family History  Problem Relation Age of Onset  . Hypertension Mother   . Other Mother     stents  . Heart disease Mother   . Parkinsonism Father   . Heart disease Father   . Cancer Father 83    prostate  . Hypertension Father   . Cancer Maternal Grandfather     prostate ?  . ADD / ADHD Son     ADHD  . Other Son 24    part of 1 lung removed- due to infection  . Stroke Maternal Grandmother   . Cancer Paternal Grandfather     History   Social History  . Marital  Status: Married    Spouse Name: N/A    Number of Children: N/A  . Years of Education: N/A   Occupational History  . Not on file.   Social History Main Topics  . Smoking status: Former Smoker -- 1.5 packs/day for 15 years    Start date: 10/07/1978  . Smokeless tobacco: Never Used  . Alcohol Use: Yes     Comment: 2 glasses of wine daily  . Drug Use: No  . Sexually Active: Yes   Other Topics Concern  . Not on file   Social History Narrative  . No narrative on file    Current Outpatient Prescriptions on File Prior to Visit  Medication Sig Dispense Refill  . aspirin 81 MG tablet Take 81 mg by mouth daily.      Marland Kitchen escitalopram (LEXAPRO) 10 MG tablet Take 10 mg by mouth daily.      . fish oil-omega-3 fatty acids 1000 MG capsule Take 2 g by mouth daily.      . hydrochlorothiazide (HYDRODIURIL) 25 MG tablet Take 1 tablet (25 mg total) by mouth daily.  90 tablet  3  . IRON COMBINATIONS PO Take by mouth.      . NON FORMULARY Occulite      . simvastatin (ZOCOR) 20 MG tablet Take 20 mg by mouth every evening.      . terazosin (HYTRIN) 5 MG capsule Take 5 mg by mouth daily.        No Known Allergies  Review of Systems  Review of Systems  Constitutional: Negative for fever and malaise/fatigue.  HENT: Negative for congestion.   Eyes: Negative for discharge.  Respiratory: Negative for shortness of breath.   Cardiovascular: Negative for chest pain, palpitations and leg swelling.  Gastrointestinal: Negative for nausea, abdominal pain and diarrhea.  Genitourinary: Negative for dysuria.  Musculoskeletal: Negative for falls.  Skin: Negative for rash.  Neurological: Positive for headaches. Negative for loss of consciousness.  Endo/Heme/Allergies: Negative for polydipsia.  Psychiatric/Behavioral: Negative for depression and suicidal ideas. The patient is not nervous/anxious and does not have insomnia.     Objective  BP 118/84  Pulse 56  Temp 98.6 F (37 C) (Temporal)  Ht 5\' 6"   (1.676 m)  Wt 183 lb (83.008 kg)  BMI 29.54 kg/m2  SpO2 98%  Physical Exam  Physical Exam  Constitutional: He is oriented to person, place, and time and well-developed, well-nourished, and in no distress. No distress.  HENT:  Head: Normocephalic and atraumatic.       B/l canals full of cerumen, disimpacted then erythema along canals and TMs noted.  Eyes: Conjunctivae normal are normal.  Neck: Neck supple. No thyromegaly present.  Cardiovascular: Normal rate, regular rhythm and normal heart sounds.   No murmur heard. Pulmonary/Chest: Effort normal and breath sounds normal. No respiratory distress.  Abdominal: Soft. Bowel sounds are normal. He exhibits no distension and no mass. There is no tenderness.  Musculoskeletal: He exhibits no edema.  Neurological: He is alert and oriented to person, place, and time.  Skin: Skin is warm.  Psychiatric: Memory, affect and judgment normal.    Lab Results  Component Value Date   TSH 1.27 07/03/2012   Lab Results  Component Value Date   WBC 8.7 07/02/2012   HGB 13.4 07/02/2012   HCT 38.2* 07/02/2012   MCV 87.8 07/02/2012   PLT 137* 07/02/2012   Lab Results  Component Value Date   CREATININE 1.1 07/31/2012   BUN 15 07/31/2012   NA 135 07/31/2012   K 3.7 07/31/2012   CL 98 07/31/2012   CO2 31 07/31/2012   Lab Results  Component Value Date   ALT 18 07/02/2012   AST 24 07/02/2012   ALKPHOS 68 07/02/2012   BILITOT 0.6 07/02/2012     Assessment & Plan  Hypertension Well controlled on current, no changes  Otitis externa Cortisporin Otic 3 drops b/l ears qid   Cerumen impaction Flushed out in office b/l

## 2012-08-10 NOTE — Patient Instructions (Signed)
Otitis Externa Otitis externa is a bacterial or fungal infection of the outer ear canal. This is the area from the eardrum to the outside of the ear. Otitis externa is sometimes called "swimmer's ear." CAUSES  Possible causes of infection include:  Swimming in dirty water.  Moisture remaining in the ear after swimming or bathing.  Mild injury (trauma) to the ear.  Objects stuck in the ear (foreign body).  Cuts or scrapes (abrasions) on the outside of the ear. SYMPTOMS  The first symptom of infection is often itching in the ear canal. Later signs and symptoms may include swelling and redness of the ear canal, ear pain, and yellowish-white fluid (pus) coming from the ear. The ear pain may be worse when pulling on the earlobe. DIAGNOSIS  Your caregiver will perform a physical exam. A sample of fluid may be taken from the ear and examined for bacteria or fungi. TREATMENT  Antibiotic ear drops are often given for 10 to 14 days. Treatment may also include pain medicine or corticosteroids to reduce itching and swelling. PREVENTION   Keep your ear dry. Use the corner of a towel to absorb water out of the ear canal after swimming or bathing.  Avoid scratching or putting objects inside your ear. This can damage the ear canal or remove the protective wax that lines the canal. This makes it easier for bacteria and fungi to grow.  Avoid swimming in lakes, polluted water, or poorly chlorinated pools.  You may use ear drops made of rubbing alcohol and vinegar after swimming. Combine equal parts of white vinegar and alcohol in a bottle. Put 3 or 4 drops into each ear after swimming. HOME CARE INSTRUCTIONS   Apply antibiotic ear drops to the ear canal as prescribed by your caregiver.  Only take over-the-counter or prescription medicines for pain, discomfort, or fever as directed by your caregiver.  If you have diabetes, follow any additional treatment instructions from your caregiver.  Keep all  follow-up appointments as directed by your caregiver. SEEK MEDICAL CARE IF:   You have a fever.  Your ear is still red, swollen, painful, or draining pus after 3 days.  Your redness, swelling, or pain gets worse.  You have a severe headache.  You have redness, swelling, pain, or tenderness in the area behind your ear. MAKE SURE YOU:   Understand these instructions.  Will watch your condition.  Will get help right away if you are not doing well or get worse. Document Released: 09/23/2005 Document Revised: 12/16/2011 Document Reviewed: 10/10/2011 [employer] Patient Information 2013 [employer].  

## 2012-08-10 NOTE — Assessment & Plan Note (Signed)
Well controlled on current, no changes

## 2012-08-10 NOTE — Assessment & Plan Note (Signed)
Flushed out in office b/l

## 2012-08-10 NOTE — Assessment & Plan Note (Signed)
Cortisporin Otic 3 drops b/l ears qid

## 2012-10-01 ENCOUNTER — Other Ambulatory Visit: Payer: Self-pay

## 2012-10-01 MED ORDER — ESCITALOPRAM OXALATE 10 MG PO TABS: 10.0000 mg | ORAL_TABLET | Freq: Every day | ORAL | Status: DC

## 2012-10-23 ENCOUNTER — Encounter: Payer: Self-pay | Admitting: Family Medicine

## 2012-11-02 ENCOUNTER — Ambulatory Visit (INDEPENDENT_AMBULATORY_CARE_PROVIDER_SITE_OTHER): Payer: Medicare Other | Admitting: Family Medicine

## 2012-11-02 ENCOUNTER — Encounter: Payer: Self-pay | Admitting: Family Medicine

## 2012-11-02 ENCOUNTER — Ambulatory Visit: Payer: Medicare Other | Admitting: Family Medicine

## 2012-11-02 VITALS — BP 132/80 | HR 61 | Temp 97.3°F | Ht 66.0 in | Wt 181.0 lb

## 2012-11-02 DIAGNOSIS — N400 Enlarged prostate without lower urinary tract symptoms: Secondary | ICD-10-CM

## 2012-11-02 DIAGNOSIS — D649 Anemia, unspecified: Secondary | ICD-10-CM

## 2012-11-02 DIAGNOSIS — H6090 Unspecified otitis externa, unspecified ear: Secondary | ICD-10-CM

## 2012-11-02 DIAGNOSIS — I10 Essential (primary) hypertension: Secondary | ICD-10-CM

## 2012-11-02 DIAGNOSIS — E785 Hyperlipidemia, unspecified: Secondary | ICD-10-CM

## 2012-11-02 DIAGNOSIS — H60399 Other infective otitis externa, unspecified ear: Secondary | ICD-10-CM

## 2012-11-02 LAB — CBC
HCT: 40.7 % (ref 39.0–52.0)
MCV: 89.3 fL (ref 78.0–100.0)
Platelets: 170 10*3/uL (ref 150–400)
RBC: 4.56 MIL/uL (ref 4.22–5.81)
WBC: 5.9 10*3/uL (ref 4.0–10.5)

## 2012-11-02 NOTE — Patient Instructions (Addendum)
  Next appt 6-7 months with labs prior lipid, renal, cbc, tsh, hepatic  Hypertension As your heart beats, it forces blood through your arteries. This force is your blood pressure. If the pressure is too high, it is called hypertension (HTN) or high blood pressure. HTN is dangerous because you may have it and not know it. High blood pressure may mean that your heart has to work harder to pump blood. Your arteries may be narrow or stiff. The extra work puts you at risk for heart disease, stroke, and other problems.  Blood pressure consists of two numbers, a higher number over a lower, 110/72, for example. It is stated as "110 over 72." The ideal is below 120 for the top number (systolic) and under 80 for the bottom (diastolic). Write down your blood pressure today. You should pay close attention to your blood pressure if you have certain conditions such as:  Heart failure.  Prior heart attack.  Diabetes  Chronic kidney disease.  Prior stroke.  Multiple risk factors for heart disease. To see if you have HTN, your blood pressure should be measured while you are seated with your arm held at the level of the heart. It should be measured at least twice. A one-time elevated blood pressure reading (especially in the Emergency Department) does not mean that you need treatment. There may be conditions in which the blood pressure is different between your right and left arms. It is important to see your caregiver soon for a recheck. Most people have essential hypertension which means that there is not a specific cause. This type of high blood pressure may be lowered by changing lifestyle factors such as:  Stress.  Smoking.  Lack of exercise.  Excessive weight.  Drug/tobacco/alcohol use.  Eating less salt. Most people do not have symptoms from high blood pressure until it has caused damage to the body. Effective treatment can often prevent, delay or reduce that damage. TREATMENT  When a cause  has been identified, treatment for high blood pressure is directed at the cause. There are a large number of medications to treat HTN. These fall into several categories, and your caregiver will help you select the medicines that are best for you. Medications may have side effects. You should review side effects with your caregiver. If your blood pressure stays high after you have made lifestyle changes or started on medicines,   Your medication(s) may need to be changed.  Other problems may need to be addressed.  Be certain you understand your prescriptions, and know how and when to take your medicine.  Be sure to follow up with your caregiver within the time frame advised (usually within two weeks) to have your blood pressure rechecked and to review your medications.  If you are taking more than one medicine to lower your blood pressure, make sure you know how and at what times they should be taken. Taking two medicines at the same time can result in blood pressure that is too low. SEEK IMMEDIATE MEDICAL CARE IF:  You develop a severe headache, blurred or changing vision, or confusion.  You have unusual weakness or numbness, or a faint feeling.  You have severe chest or abdominal pain, vomiting, or breathing problems. MAKE SURE YOU:   Understand these instructions.  Will watch your condition.  Will get help right away if you are not doing well or get worse. Document Released: 09/23/2005 Document Revised: 12/16/2011 Document Reviewed: 05/13/2008 [facility] Patient Information 2013 [place], [place].

## 2012-11-02 NOTE — Assessment & Plan Note (Addendum)
Well controlled, no changes 

## 2012-11-02 NOTE — Assessment & Plan Note (Signed)
Tolerating Simvastatin, no changes today, labs with next visit

## 2012-11-02 NOTE — Assessment & Plan Note (Signed)
Clear, asymptomatic today. May use eardrops as needed.

## 2012-11-02 NOTE — Assessment & Plan Note (Signed)
Has been taking an iron supplement, will repeat cbc today

## 2012-11-02 NOTE — Progress Notes (Signed)
Patient ID: Walter Brown, male   DOB: 1941/04/01, 72 y.o.   MRN: [id number] THAILAND DUBE [id number] [birth year] 11/02/2012      Progress Note-Follow Up  Subjective  Chief Complaint  Chief Complaint  Patient presents with  . Follow-up    3 month    HPI  Patient is a 72 year old Caucasian male who is in today for three-month followup. Overall he feels well he has been trying to follow DASH diet and has been exercising at least 4 times a week. He is very happy with his weight loss and says he feels better. Continues to follow with urology he denies any GU complaints. No recent illness, fevers, chills, chest pain, palpitations, shortness of breath, GI or GU concerns are noted today.  Past Medical History  Diagnosis Date  . BPH (benign prostatic hyperplasia)   . Chicken pox as child  . [place] measles as a child  . Cancer     skin- on nose- basal cell (20 yrs ago) forehead 1 year ago  . Thrombocytopenia   . Anemia     mild  . Asthma     childhood  . Hyperlipidemia   . Hypertension   . Preventative health care 08/01/2012  . Depression 08/01/2012  . Cerumen impaction 08/10/2012  . Otitis externa 08/10/2012    Past Surgical History  Procedure Date  . Hydrocele excision / repair 2012    b/l  . Skin cancer excision     nose and forehead  . Belpharoptosis repair     very young, b/l    Family History  Problem Relation Age of Onset  . Hypertension Mother   . Other Mother     stents  . Heart disease Mother   . Parkinsonism Father   . Heart disease Father   . Cancer Father 61    prostate  . Hypertension Father   . Cancer Maternal Grandfather     prostate ?  . ADD / ADHD Son     ADHD  . Other Son 24    part of 1 lung removed- due to infection  . Stroke Maternal Grandmother   . Cancer Paternal Grandfather     History   Social History  . Marital Status: Married    Spouse Name: N/A    Number of Children: N/A  . Years of Education: N/A   Occupational History  .  Not on file.   Social History Main Topics  . Smoking status: Former Smoker -- 1.5 packs/day for 15 years    Start date: 10/07/1978  . Smokeless tobacco: Never Used  . Alcohol Use: Yes     Comment: 2 glasses of wine daily  . Drug Use: No  . Sexually Active: Yes   Other Topics Concern  . Not on file   Social History Narrative  . No narrative on file    Current Outpatient Prescriptions on File Prior to Visit  Medication Sig Dispense Refill  . aspirin 81 MG tablet Take 81 mg by mouth daily.      Marland Kitchen escitalopram (LEXAPRO) 10 MG tablet Take 1 tablet (10 mg total) by mouth daily.  90 tablet  2  . fish oil-omega-3 fatty acids 1000 MG capsule Take 2 g by mouth daily.      . hydrochlorothiazide (HYDRODIURIL) 25 MG tablet Take 1 tablet (25 mg total) by mouth daily.  90 tablet  3  . IRON COMBINATIONS PO Take by mouth.      . neomycin-polymyxin-hydrocortisone (  CORTISPORIN) otic solution Place 3 drops into both ears 4 (four) times daily.  10 mL  1  . NON FORMULARY Occulite      . simvastatin (ZOCOR) 20 MG tablet Take 20 mg by mouth every evening.      . terazosin (HYTRIN) 5 MG capsule Take 5 mg by mouth daily.        No Known Allergies  Review of Systems  Review of Systems  Constitutional: Negative for fever and malaise/fatigue.  HENT: Negative for congestion.   Eyes: Negative for discharge.  Respiratory: Negative for shortness of breath.   Cardiovascular: Negative for chest pain, palpitations and leg swelling.  Gastrointestinal: Negative for nausea, abdominal pain and diarrhea.  Genitourinary: Negative for dysuria.  Musculoskeletal: Negative for falls.  Skin: Negative for rash.  Neurological: Negative for loss of consciousness and headaches.  Endo/Heme/Allergies: Negative for polydipsia.  Psychiatric/Behavioral: Negative for depression and suicidal ideas. The patient is not nervous/anxious and does not have insomnia.     Objective  BP 132/80  Pulse 61  Temp 97.3 F (36.3 C)  (Temporal)  Ht 5\' 6"  (1.676 m)  Wt 181 lb (82.101 kg)  BMI 29.21 kg/m2  SpO2 97%  Physical Exam  Physical Exam  Constitutional: He is oriented to person, place, and time and well-developed, well-nourished, and in no distress. No distress.  HENT:  Head: Normocephalic and atraumatic.  Eyes: Conjunctivae normal are normal.  Neck: Neck supple. No thyromegaly present.  Cardiovascular: Normal rate, regular rhythm and normal heart sounds.   No murmur heard. Pulmonary/Chest: Effort normal and breath sounds normal. No respiratory distress.  Abdominal: He exhibits no distension and no mass. There is no tenderness.  Musculoskeletal: He exhibits no edema.  Neurological: He is alert and oriented to person, place, and time.  Skin: Skin is warm.  Psychiatric: Memory, affect and judgment normal.    Lab Results  Component Value Date   TSH 1.27 07/03/2012   Lab Results  Component Value Date   WBC 8.7 07/02/2012   HGB 13.4 07/02/2012   HCT 38.2* 07/02/2012   MCV 87.8 07/02/2012   PLT 137* 07/02/2012   Lab Results  Component Value Date   CREATININE 1.1 07/31/2012   BUN 15 07/31/2012   NA 135 07/31/2012   K 3.7 07/31/2012   CL 98 07/31/2012   CO2 31 07/31/2012   Lab Results  Component Value Date   ALT 18 07/02/2012   AST 24 07/02/2012   ALKPHOS 68 07/02/2012   BILITOT 0.6 07/02/2012    Assessment & Plan  Hypertension Well controlled, no changes  BPH (benign prostatic hyperplasia) Reports his recent PSA was 3.1  Anemia Has been taking an iron supplement, will repeat cbc today  Hyperlipidemia Tolerating Simvastatin, no changes today, labs with next visit  Otitis externa Clear, asymptomatic today. May use eardrops as needed.

## 2012-11-02 NOTE — Assessment & Plan Note (Signed)
Reports his recent PSA was 3.1

## 2013-04-26 ENCOUNTER — Encounter: Payer: Self-pay | Admitting: Family Medicine

## 2013-04-26 ENCOUNTER — Ambulatory Visit (INDEPENDENT_AMBULATORY_CARE_PROVIDER_SITE_OTHER): Payer: BC Managed Care – HMO | Admitting: Family Medicine

## 2013-04-26 VITALS — BP 130/82 | HR 55 | Temp 97.6°F | Ht 66.0 in | Wt 174.1 lb

## 2013-04-26 DIAGNOSIS — M549 Dorsalgia, unspecified: Secondary | ICD-10-CM

## 2013-04-26 DIAGNOSIS — D649 Anemia, unspecified: Secondary | ICD-10-CM

## 2013-04-26 DIAGNOSIS — I10 Essential (primary) hypertension: Secondary | ICD-10-CM

## 2013-04-26 DIAGNOSIS — E785 Hyperlipidemia, unspecified: Secondary | ICD-10-CM

## 2013-04-26 DIAGNOSIS — R350 Frequency of micturition: Secondary | ICD-10-CM

## 2013-04-26 DIAGNOSIS — D696 Thrombocytopenia, unspecified: Secondary | ICD-10-CM

## 2013-04-26 DIAGNOSIS — N400 Enlarged prostate without lower urinary tract symptoms: Secondary | ICD-10-CM

## 2013-04-26 HISTORY — DX: Dorsalgia, unspecified: M54.9

## 2013-04-26 NOTE — Assessment & Plan Note (Signed)
Improving, try Salon pas, heat and gentle stretching

## 2013-04-26 NOTE — Patient Instructions (Addendum)
Salon Pas patches, cream as needed for back or joint pain  Next visit annual  Back Pain, Adult Low back pain is very common. About 1 in 5 people have back pain.The cause of low back pain is rarely dangerous. The pain often gets better over time.About half of people with a sudden onset of back pain feel better in just 2 weeks. About 8 in 10 people feel better by 6 weeks.  CAUSES Some common causes of back pain include:  Strain of the muscles or ligaments supporting the spine.  Wear and tear (degeneration) of the spinal discs.  Arthritis.  Direct injury to the back. DIAGNOSIS Most of the time, the direct cause of low back pain is not known.However, back pain can be treated effectively even when the exact cause of the pain is unknown.Answering your caregiver's questions about your overall health and symptoms is one of the most accurate ways to make sure the cause of your pain is not dangerous. If your caregiver needs more information, he or she may order lab work or imaging tests (X-rays or MRIs).However, even if imaging tests show changes in your back, this usually does not require surgery. HOME CARE INSTRUCTIONS For many people, back pain returns.Since low back pain is rarely dangerous, it is often a condition that people can learn to [facility] their own.   Remain active. It is stressful on the back to sit or stand in one place. Do not sit, drive, or stand in one place for more than 30 minutes at a time. Take short walks on level surfaces as soon as pain allows.Try to increase the length of time you walk each day.  Do not stay in bed.Resting more than 1 or 2 days can delay your recovery.  Do not avoid exercise or work.Your body is made to move.It is not dangerous to be active, even though your back may hurt.Your back will likely heal faster if you return to being active before your pain is gone.  Pay attention to your body when you bend and lift. Many people have less  discomfortwhen lifting if they bend their knees, keep the load close to their bodies,and avoid twisting. Often, the most comfortable positions are those that put less stress on your recovering back.  Find a comfortable position to sleep. Use a firm mattress and lie on your side with your knees slightly bent. If you lie on your back, put a pillow under your knees.  Only take over-the-counter or prescription medicines as directed by your caregiver. Over-the-counter medicines to reduce pain and inflammation are often the most helpful.Your caregiver may prescribe muscle relaxant drugs.These medicines help dull your pain so you can more quickly return to your normal activities and healthy exercise.  Put ice on the injured area.  Put ice in a plastic bag.  Place a towel between your skin and the bag.  Leave the ice on for 15-20 minutes, 3-4 times a day for the first 2 to 3 days. After that, ice and heat may be alternated to reduce pain and spasms.  Ask your caregiver about trying back exercises and gentle massage. This may be of some benefit.  Avoid feeling anxious or stressed.Stress increases muscle tension and can worsen back pain.It is important to recognize when you are anxious or stressed and learn ways to manage it.Exercise is a great option. SEEK MEDICAL CARE IF:  You have pain that is not relieved with rest or medicine.  You have pain that does not improve  in 1 week.  You have new symptoms.  You are generally not feeling well. SEEK IMMEDIATE MEDICAL CARE IF:   You have pain that radiates from your back into your legs.  You develop new bowel or bladder control problems.  You have unusual weakness or numbness in your arms or legs.  You develop nausea or vomiting.  You develop abdominal pain.  You feel faint. Document Released: 09/23/2005 Document Revised: 03/24/2012 Document Reviewed: 02/11/2011 [facility] Patient Information 2014 [place], [place].

## 2013-04-26 NOTE — Assessment & Plan Note (Signed)
Check lipids, continue Zocor may drop to 10 mg daily, avoid trans fats

## 2013-04-26 NOTE — Progress Notes (Signed)
Patient ID: Walter Brown, male   DOB: 16-Nov-1940, 72 y.o.   MRN: [id number] ARSENIO SCHNORR [id number] 1941-09-10 04/26/2013      Progress Note-Follow Up  Subjective  Chief Complaint  Chief Complaint  Patient presents with  . Follow-up    6 month    HPI  Patient is a 72 year old Caucasian male who is in today for followup. Overall he reports she's doing well but he did stop his HCTZ to 3 weeks ago secondary to urinary frequency. He believes it helped slightly. He is following with urology for his BPH. No other new or recent complaints. He says he's been taking his Zocor roughly 2-3 times a week. He denies myalgias or trouble with the medication when he took it more frequently. Has been struggling with low back pain for about 3 weeks now after doing some heavy lifting. Was having some radicular symptoms down the left leg but those are largely resolved at this time. No incontinence numbness or tingling is appreciated. No recent illness. No chest pain, palpitations, shortness of breath, GI or GU concerns or otherwise noted today.  Past Medical History  Diagnosis Date  . BPH (benign prostatic hyperplasia)   . Chicken pox as child  . [place] measles as a child  . Cancer     skin- on nose- basal cell (20 yrs ago) forehead 1 year ago  . Thrombocytopenia   . Anemia     mild  . Asthma     childhood  . Hyperlipidemia   . Hypertension   . Preventative health care 08/01/2012  . Depression 08/01/2012  . Cerumen impaction 08/10/2012  . Otitis externa 08/10/2012    Past Surgical History  Procedure Laterality Date  . Hydrocele excision / repair  2012    b/l  . Skin cancer excision      nose and forehead  . Belpharoptosis repair      very young, b/l    Family History  Problem Relation Age of Onset  . Hypertension Mother   . Other Mother     stents  . Heart disease Mother   . Parkinsonism Father   . Heart disease Father   . Cancer Father 29    prostate  . Hypertension Father   .  Cancer Maternal Grandfather     prostate ?  . ADD / ADHD Son     ADHD  . Other Son 24    part of 1 lung removed- due to infection  . Stroke Maternal Grandmother   . Cancer Paternal Grandfather     History   Social History  . Marital Status: Married    Spouse Name: N/A    Number of Children: N/A  . Years of Education: N/A   Occupational History  . Not on file.   Social History Main Topics  . Smoking status: Former Smoker -- 1.50 packs/day for 15 years    Start date: 10/07/1978  . Smokeless tobacco: Never Used  . Alcohol Use: Yes     Comment: 2 glasses of wine daily  . Drug Use: No  . Sexually Active: Yes   Other Topics Concern  . Not on file   Social History Narrative  . No narrative on file    Current Outpatient Prescriptions on File Prior to Visit  Medication Sig Dispense Refill  . aspirin 81 MG tablet Take 81 mg by mouth daily.      Marland Kitchen escitalopram (LEXAPRO) 10 MG tablet Take 1 tablet (10 mg  total) by mouth daily.  90 tablet  2  . fish oil-omega-3 fatty acids 1000 MG capsule Take 2 g by mouth daily.      . IRON COMBINATIONS PO Take by mouth.      . neomycin-polymyxin-hydrocortisone (CORTISPORIN) otic solution Place 3 drops into both ears 4 (four) times daily.  10 mL  1  . NON FORMULARY Occulite      . simvastatin (ZOCOR) 20 MG tablet Take 20 mg by mouth every evening.      . terazosin (HYTRIN) 5 MG capsule Take 5 mg by mouth daily.       No current facility-administered medications on file prior to visit.    No Known Allergies  Review of Systems  Review of Systems  Constitutional: Negative for fever and malaise/fatigue.  HENT: Negative for congestion.   Eyes: Negative for pain and discharge.  Respiratory: Negative for shortness of breath.   Cardiovascular: Negative for chest pain, palpitations and leg swelling.  Gastrointestinal: Negative for nausea, abdominal pain and diarrhea.  Genitourinary: Negative for dysuria.  Musculoskeletal: Positive for back  pain. Negative for falls.  Skin: Negative for rash.  Neurological: Negative for loss of consciousness and headaches.  Endo/Heme/Allergies: Negative for polydipsia.  Psychiatric/Behavioral: Negative for depression and suicidal ideas. The patient is not nervous/anxious and does not have insomnia.     Objective  BP 130/82  Pulse 55  Temp(Src) 97.6 F (36.4 C) (Oral)  Ht 5\' 6"  (1.676 m)  Wt 174 lb 1.3 oz (78.962 kg)  BMI 28.11 kg/m2  SpO2 98%  Physical Exam  Physical Exam  Constitutional: He is oriented to person, place, and time and well-developed, well-nourished, and in no distress. No distress.  HENT:  Head: Normocephalic and atraumatic.  Eyes: Conjunctivae are normal.  Neck: Neck supple. No thyromegaly present.  Cardiovascular: Normal rate and regular rhythm.  Exam reveals no gallop.   No murmur heard. Pulmonary/Chest: Effort normal and breath sounds normal. No respiratory distress.  Abdominal: He exhibits no distension and no mass. There is no tenderness.  Musculoskeletal: He exhibits no edema.  Neurological: He is alert and oriented to person, place, and time.  Skin: Skin is warm.  Psychiatric: Memory, affect and judgment normal.    Lab Results  Component Value Date   TSH 1.27 07/03/2012   Lab Results  Component Value Date   WBC 5.9 11/02/2012   HGB 14.3 11/02/2012   HCT 40.7 11/02/2012   MCV 89.3 11/02/2012   PLT 170 11/02/2012   Lab Results  Component Value Date   CREATININE 1.1 07/31/2012   BUN 15 07/31/2012   NA 135 07/31/2012   K 3.7 07/31/2012   CL 98 07/31/2012   CO2 31 07/31/2012   Lab Results  Component Value Date   ALT 18 07/02/2012   AST 24 07/02/2012   ALKPHOS 68 07/02/2012   BILITOT 0.6 07/02/2012     Assessment & Plan  Hypertension Improved on repeat. Will not restart HCTZ at this time, can take a 1/2 tab daily if it trends back up and let us know  Thrombocytopenia Check cbc  Anemia Check cbc  BPH (benign prostatic hyperplasia) Sees  urology, check UA today due to frequency  Hyperlipidemia Check lipids, continue Zocor may drop to 10 mg daily, avoid trans fats  Back pain with radiation Improving, try Salon pas, heat and gentle stretching

## 2013-04-26 NOTE — Assessment & Plan Note (Signed)
Check cbc 

## 2013-04-26 NOTE — Assessment & Plan Note (Signed)
Sees urology, check UA today due to frequency

## 2013-04-26 NOTE — Assessment & Plan Note (Signed)
Improved on repeat. Will not restart HCTZ at this time, can take a 1/2 tab daily if it trends back up and let us know

## 2013-04-27 LAB — RENAL FUNCTION PANEL
Albumin: 4.1 g/dL (ref 3.5–5.2)
Calcium: 9.3 mg/dL (ref 8.4–10.5)
Creat: 1.1 mg/dL (ref 0.50–1.35)
Glucose, Bld: 91 mg/dL (ref 70–99)
Sodium: 139 mEq/L (ref 135–145)

## 2013-04-27 LAB — HEPATIC FUNCTION PANEL
ALT: 24 U/L (ref 0–53)
Albumin: 4.1 g/dL (ref 3.5–5.2)
Bilirubin, Direct: 0.3 mg/dL (ref 0.0–0.3)
Total Protein: 6.3 g/dL (ref 6.0–8.3)

## 2013-04-27 LAB — CBC
HCT: 43.4 % (ref 39.0–52.0)
MCHC: 34.1 g/dL (ref 30.0–36.0)
Platelets: 134 10*3/uL — ABNORMAL LOW (ref 150–400)
RDW: 13.9 % (ref 11.5–15.5)
WBC: 6.3 10*3/uL (ref 4.0–10.5)

## 2013-04-27 LAB — LIPID PANEL
HDL: 68 mg/dL (ref >39)
Total CHOL/HDL Ratio: 2.3 Ratio
Triglycerides: 82 mg/dL (ref <150)

## 2013-04-27 LAB — TSH: TSH: 1.681 u[IU]/mL (ref 0.350–4.500)

## 2013-04-28 LAB — URINALYSIS
Nitrite: NEGATIVE
Protein, ur: NEGATIVE mg/dL
Specific Gravity, Urine: 1.028 (ref 1.005–1.030)
Urobilinogen, UA: 1 mg/dL (ref 0.0–1.0)

## 2013-05-12 ENCOUNTER — Other Ambulatory Visit: Payer: Self-pay

## 2013-06-18 ENCOUNTER — Telehealth: Payer: Self-pay

## 2013-06-18 MED ORDER — ESCITALOPRAM OXALATE 10 MG PO TABS: 10.0000 mg | ORAL_TABLET | Freq: Every day | ORAL | Status: DC

## 2013-06-18 NOTE — Telephone Encounter (Signed)
rx sent

## 2013-08-12 ENCOUNTER — Other Ambulatory Visit: Payer: Self-pay

## 2013-08-24 ENCOUNTER — Encounter: Payer: Self-pay | Admitting: Family Medicine

## 2013-09-09 ENCOUNTER — Ambulatory Visit (INDEPENDENT_AMBULATORY_CARE_PROVIDER_SITE_OTHER): Payer: No Typology Code available for payment source | Admitting: Licensed Clinical Social Worker

## 2013-09-09 DIAGNOSIS — F411 Generalized anxiety disorder: Secondary | ICD-10-CM

## 2013-09-16 ENCOUNTER — Ambulatory Visit (INDEPENDENT_AMBULATORY_CARE_PROVIDER_SITE_OTHER): Payer: No Typology Code available for payment source | Admitting: Licensed Clinical Social Worker

## 2013-09-16 DIAGNOSIS — F411 Generalized anxiety disorder: Secondary | ICD-10-CM

## 2013-09-21 ENCOUNTER — Telehealth: Payer: Self-pay | Admitting: Family Medicine

## 2013-09-21 NOTE — Telephone Encounter (Signed)
Patient says that mychart states that he is due for a tetanus and shingles inj. He would like to know if this is correct. Also, he states that if he does need those injections he would like to get them at his 11/04/13 visit.

## 2013-10-21 ENCOUNTER — Ambulatory Visit (INDEPENDENT_AMBULATORY_CARE_PROVIDER_SITE_OTHER): Payer: Medicare HMO | Admitting: Licensed Clinical Social Worker

## 2013-10-21 DIAGNOSIS — F411 Generalized anxiety disorder: Secondary | ICD-10-CM

## 2013-10-25 ENCOUNTER — Encounter: Payer: Medicare Other | Admitting: Family Medicine

## 2013-11-04 ENCOUNTER — Ambulatory Visit (INDEPENDENT_AMBULATORY_CARE_PROVIDER_SITE_OTHER): Payer: Medicare HMO | Admitting: Family Medicine

## 2013-11-04 ENCOUNTER — Telehealth: Payer: Self-pay | Admitting: Family Medicine

## 2013-11-04 ENCOUNTER — Encounter: Payer: Self-pay | Admitting: Family Medicine

## 2013-11-04 VITALS — BP 140/86 | HR 58 | Temp 97.7°F | Ht 66.0 in | Wt 183.0 lb

## 2013-11-04 DIAGNOSIS — F3289 Other specified depressive episodes: Secondary | ICD-10-CM

## 2013-11-04 DIAGNOSIS — T7840XA Allergy, unspecified, initial encounter: Secondary | ICD-10-CM

## 2013-11-04 DIAGNOSIS — F32A Depression, unspecified: Secondary | ICD-10-CM

## 2013-11-04 DIAGNOSIS — D696 Thrombocytopenia, unspecified: Secondary | ICD-10-CM

## 2013-11-04 DIAGNOSIS — E785 Hyperlipidemia, unspecified: Secondary | ICD-10-CM

## 2013-11-04 DIAGNOSIS — D649 Anemia, unspecified: Secondary | ICD-10-CM

## 2013-11-04 DIAGNOSIS — I10 Essential (primary) hypertension: Secondary | ICD-10-CM

## 2013-11-04 DIAGNOSIS — J45909 Unspecified asthma, uncomplicated: Secondary | ICD-10-CM

## 2013-11-04 DIAGNOSIS — Z0000 Encounter for general adult medical examination without abnormal findings: Secondary | ICD-10-CM

## 2013-11-04 DIAGNOSIS — F329 Major depressive disorder, single episode, unspecified: Secondary | ICD-10-CM

## 2013-11-04 DIAGNOSIS — Z23 Encounter for immunization: Secondary | ICD-10-CM

## 2013-11-04 LAB — HEPATIC FUNCTION PANEL
ALK PHOS: 50 U/L (ref 39–117)
ALT: 17 U/L (ref 0–53)
AST: 21 U/L (ref 0–37)
Albumin: 4.4 g/dL (ref 3.5–5.2)
BILIRUBIN INDIRECT: 1.2 mg/dL (ref 0.2–1.2)
Bilirubin, Direct: 0.3 mg/dL (ref 0.0–0.3)
TOTAL PROTEIN: 6.6 g/dL (ref 6.0–8.3)
Total Bilirubin: 1.5 mg/dL — ABNORMAL HIGH (ref 0.2–1.2)

## 2013-11-04 LAB — RENAL FUNCTION PANEL
Albumin: 4.4 g/dL (ref 3.5–5.2)
BUN: 13 mg/dL (ref 6–23)
CHLORIDE: 104 meq/L (ref 96–112)
CO2: 28 meq/L (ref 19–32)
CREATININE: 1.03 mg/dL (ref 0.50–1.35)
Calcium: 9.2 mg/dL (ref 8.4–10.5)
GLUCOSE: 91 mg/dL (ref 70–99)
Phosphorus: 2.8 mg/dL (ref 2.3–4.6)
Potassium: 4.1 mEq/L (ref 3.5–5.3)
Sodium: 139 mEq/L (ref 135–145)

## 2013-11-04 LAB — CBC
HEMATOCRIT: 41.7 % (ref 39.0–52.0)
Hemoglobin: 14.6 g/dL (ref 13.0–17.0)
MCH: 31.4 pg (ref 26.0–34.0)
MCHC: 35 g/dL (ref 30.0–36.0)
MCV: 89.7 fL (ref 78.0–100.0)
PLATELETS: 144 10*3/uL — AB (ref 150–400)
RBC: 4.65 MIL/uL (ref 4.22–5.81)
RDW: 14.1 % (ref 11.5–15.5)
WBC: 7.2 10*3/uL (ref 4.0–10.5)

## 2013-11-04 LAB — LIPID PANEL
Cholesterol: 150 mg/dL (ref 0–200)
HDL: 69 mg/dL (ref >39)
LDL CALC: 68 mg/dL (ref 0–99)
Total CHOL/HDL Ratio: 2.2 Ratio
Triglycerides: 63 mg/dL (ref <150)
VLDL: 13 mg/dL (ref 0–40)

## 2013-11-04 MED ORDER — FLUTICASONE PROPIONATE 50 MCG/ACT NA SUSP: 2.0000 | Freq: Every day | NASAL | Status: DC

## 2013-11-04 MED ORDER — HYDROCHLOROTHIAZIDE 25 MG PO TABS: 25.0000 mg | ORAL_TABLET | Freq: Every day | ORAL | Status: DC

## 2013-11-04 NOTE — Telephone Encounter (Signed)
WOULD LIKE RBC AND CBC ADDED TO THE LABS HE HAD DRAWN TODAY

## 2013-11-04 NOTE — Patient Instructions (Signed)

## 2013-11-04 NOTE — Progress Notes (Signed)
Patient ID: Walter Brown, male   DOB: Jan 07, 1941, 73 y.o.   MRN: [id number] [name] [id number] 07/31/1941 11/04/2013      Progress Note-Follow Up  Subjective  Chief Complaint  Chief Complaint  Patient presents with  . Annual Exam    physical  . Injections    prevnar and tdap    HPI  Patient is a 73 year old Caucasian male who is in today for routine medical care. He exercises regularly. He follows at the gym and does help with an elliptical exercise several days a week. He tries to follow a heart healthy diet. He had his eyes checked 3-4 weeks ago. Follows with cardiology as well as dermatology, Dr. He has seen ear nose and throat in the past but none recently. Follows with urology and saw him in December of 2014 OV does not remember their names. No chest pain or palpitations. No shortness of breath GI or GU concerns  Past Medical History  Diagnosis Date  . BPH (benign prostatic hyperplasia)   . Chicken pox as child  . [place] measles as a child  . Cancer     skin- on nose- basal cell (20 yrs ago) forehead 1 year ago  . Thrombocytopenia   . Anemia     mild  . Asthma     childhood  . Hyperlipidemia   . Hypertension   . Preventative health care 08/01/2012  . Depression 08/01/2012  . Cerumen impaction 08/10/2012  . Otitis externa 08/10/2012    Past Surgical History  Procedure Laterality Date  . Hydrocele excision / repair  2012    b/l  . Skin cancer excision      nose and forehead  . Belpharoptosis repair      very young, b/l    Family History  Problem Relation Age of Onset  . Hypertension Mother   . Other Mother     stents  . Heart disease Mother   . Parkinsonism Father   . Heart disease Father   . Cancer Father 36    prostate  . Hypertension Father   . Cancer Maternal Grandfather     prostate ?  . ADD / ADHD Son     ADHD  . Other Son 24    part of 1 lung removed- due to infection  . Stroke Maternal Grandmother   . Cancer Paternal Grandfather      History   Social History  . Marital Status: Married    Spouse Name: N/A    Number of Children: N/A  . Years of Education: N/A   Occupational History  . Not on file.   Social History Main Topics  . Smoking status: Former Smoker -- 1.50 packs/day for 15 years    Start date: 10/07/1978  . Smokeless tobacco: Never Used  . Alcohol Use: Yes     Comment: 2 glasses of wine daily  . Drug Use: No  . Sexual Activity: Yes   Other Topics Concern  . Not on file   Social History Narrative  . No narrative on file    Current Outpatient Prescriptions on File Prior to Visit  Medication Sig Dispense Refill  . aspirin 81 MG tablet Take 81 mg by mouth daily.      Marland Kitchen escitalopram (LEXAPRO) 10 MG tablet Take 1 tablet (10 mg total) by mouth daily.  90 tablet  1  . fish oil-omega-3 fatty acids 1000 MG capsule Take 2 g by mouth daily.      Marland Kitchen  IRON COMBINATIONS PO Take by mouth.      . neomycin-polymyxin-hydrocortisone (CORTISPORIN) otic solution Place 3 drops into both ears 4 (four) times daily.  10 mL  1  . NON FORMULARY Occulite      . simvastatin (ZOCOR) 20 MG tablet Take 20 mg by mouth every evening.      . terazosin (HYTRIN) 5 MG capsule Take 5 mg by mouth daily.       No current facility-administered medications on file prior to visit.    No Known Allergies  Review of Systems  Review of Systems  Constitutional: Negative for fever, chills and malaise/fatigue.  HENT: Negative for congestion, hearing loss and nosebleeds.   Eyes: Negative for discharge.  Respiratory: Negative for cough, sputum production, shortness of breath and wheezing.   Cardiovascular: Negative for chest pain, palpitations and leg swelling.  Gastrointestinal: Negative for heartburn, nausea, vomiting, abdominal pain, diarrhea, constipation and blood in stool.  Genitourinary: Negative for dysuria, urgency, frequency and hematuria.  Musculoskeletal: Negative for back pain, falls and myalgias.  Skin: Negative for  rash.  Neurological: Negative for dizziness, tremors, sensory change, focal weakness, loss of consciousness, weakness and headaches.  Endo/Heme/Allergies: Negative for polydipsia. Does not bruise/bleed easily.  Psychiatric/Behavioral: Negative for depression and suicidal ideas. The patient is not nervous/anxious and does not have insomnia.     Objective  BP 140/86  Pulse 58  Temp(Src) 97.7 F (36.5 C) (Oral)  Ht 5\' 6"  (1.676 m)  Wt 183 lb 0.6 oz (83.026 kg)  BMI 29.56 kg/m2  SpO2 98%  Physical Exam  Physical Exam  Constitutional: He is oriented to person, place, and time and well-developed, well-nourished, and in no distress. No distress.  HENT:  Head: Normocephalic and atraumatic.  Eyes: Conjunctivae are normal.  Neck: Neck supple. No thyromegaly present.  Cardiovascular: Normal rate, regular rhythm and normal heart sounds.   No murmur heard. Pulmonary/Chest: Effort normal and breath sounds normal. No respiratory distress.  Abdominal: He exhibits no distension and no mass. There is no tenderness.  Musculoskeletal: He exhibits no edema.  Neurological: He is alert and oriented to person, place, and time.  Skin: Skin is warm.  Psychiatric: Memory, affect and judgment normal.    Lab Results  Component Value Date   TSH 1.681 04/27/2013   Lab Results  Component Value Date   WBC 6.3 04/27/2013   HGB 14.8 04/27/2013   HCT 43.4 04/27/2013   MCV 90.4 04/27/2013   PLT 134* 04/27/2013   Lab Results  Component Value Date   CREATININE 1.10 04/27/2013   BUN 16 04/27/2013   NA 139 04/27/2013   K 4.1 04/27/2013   CL 106 04/27/2013   CO2 27 04/27/2013   Lab Results  Component Value Date   ALT 24 04/27/2013   AST 19 04/27/2013   ALKPHOS 52 04/27/2013   BILITOT 1.3* 04/27/2013   Lab Results  Component Value Date   CHOL 158 04/27/2013   Lab Results  Component Value Date   HDL 68 04/27/2013   Lab Results  Component Value Date   LDLCALC 74 04/27/2013   Lab Results  Component Value  Date   TRIG 82 04/27/2013   Lab Results  Component Value Date   CHOLHDL 2.3 04/27/2013     Assessment & Plan  Hypertension Well controlled, no changes, consider DASH diet  Hyperlipidemia Tolerating Zocor, avoid trans fats, no changes  Thrombocytopenia Mild, stable  Depression Stable on Lexapro. No changes  Medicare annual wellness visit, subsequent  Patient doing well, no recent illness, depression well controlled, no falls or difficulty with ADLs, has some advanced directives but unclear what they say exactly will bring [place] in copies. No recent vision or hearing changes. Given tdap and prevnar today

## 2013-11-04 NOTE — Telephone Encounter (Signed)
I reprinted his CBC to say lab collect, please get to lab please

## 2013-11-04 NOTE — Progress Notes (Signed)
Pre visit review using our clinic review tool, if applicable. No additional management support is needed unless otherwise documented below in the visit note. 

## 2013-11-04 NOTE — Telephone Encounter (Signed)
LAB ORDER WEEK OF 02-21-2014 Labs prior to visit lipid, renal, cbc, tsh, hepatic

## 2013-11-05 ENCOUNTER — Telehealth: Payer: Self-pay | Admitting: Family Medicine

## 2013-11-05 LAB — TSH: TSH: 1.209 u[IU]/mL (ref 0.350–4.500)

## 2013-11-05 NOTE — Telephone Encounter (Signed)
Relevant patient education assigned to patient using Emmi. ° °

## 2013-11-07 ENCOUNTER — Encounter: Payer: Self-pay | Admitting: Family Medicine

## 2013-11-07 DIAGNOSIS — Z0000 Encounter for general adult medical examination without abnormal findings: Secondary | ICD-10-CM | POA: Insufficient documentation

## 2013-11-07 NOTE — Assessment & Plan Note (Signed)
Tolerating Zocor, avoid trans fats, no changes

## 2013-11-07 NOTE — Assessment & Plan Note (Signed)
Well controlled, no changes, consider DASH diet

## 2013-11-07 NOTE — Assessment & Plan Note (Signed)
Mild, stable.  

## 2013-11-07 NOTE — Assessment & Plan Note (Addendum)
Patient doing well, no recent illness, depression well controlled, no falls or difficulty with ADLs, has some advanced directives but unclear what they say exactly will bring [place] in copies. No recent vision or hearing changes. Given tdap and prevnar today

## 2013-11-07 NOTE — Assessment & Plan Note (Signed)
Stable on Lexapro. No changes

## 2013-11-08 NOTE — Telephone Encounter (Signed)
Lab order placed.

## 2013-11-17 ENCOUNTER — Encounter: Payer: Self-pay | Admitting: Family Medicine

## 2013-11-22 MED ORDER — ESCITALOPRAM OXALATE 20 MG PO TABS: 20.0000 mg | ORAL_TABLET | Freq: Every day | ORAL | Status: DC

## 2013-11-22 NOTE — Addendum Note (Signed)
Addended by: Varney Daily on: 11/22/2013 08:12 AM   Modules accepted: Orders

## 2013-12-25 ENCOUNTER — Emergency Department (HOSPITAL_BASED_OUTPATIENT_CLINIC_OR_DEPARTMENT_OTHER)
Admission: EM | Admit: 2013-12-25 | Discharge: 2013-12-25 | Disposition: A | Discharge status: Home / Self Care | Payer: Medicare HMO | Attending: Emergency Medicine | Admitting: Emergency Medicine

## 2013-12-25 ENCOUNTER — Encounter (HOSPITAL_BASED_OUTPATIENT_CLINIC_OR_DEPARTMENT_OTHER): Payer: Self-pay | Admitting: Emergency Medicine

## 2013-12-25 ENCOUNTER — Emergency Department (HOSPITAL_BASED_OUTPATIENT_CLINIC_OR_DEPARTMENT_OTHER): Payer: Medicare HMO

## 2013-12-25 DIAGNOSIS — D649 Anemia, unspecified: Secondary | ICD-10-CM | POA: Insufficient documentation

## 2013-12-25 DIAGNOSIS — Z792 Long term (current) use of antibiotics: Secondary | ICD-10-CM | POA: Insufficient documentation

## 2013-12-25 DIAGNOSIS — J45909 Unspecified asthma, uncomplicated: Secondary | ICD-10-CM | POA: Insufficient documentation

## 2013-12-25 DIAGNOSIS — Z85828 Personal history of other malignant neoplasm of skin: Secondary | ICD-10-CM | POA: Insufficient documentation

## 2013-12-25 DIAGNOSIS — Z87448 Personal history of other diseases of urinary system: Secondary | ICD-10-CM | POA: Insufficient documentation

## 2013-12-25 DIAGNOSIS — E785 Hyperlipidemia, unspecified: Secondary | ICD-10-CM | POA: Insufficient documentation

## 2013-12-25 DIAGNOSIS — IMO0002 Reserved for concepts with insufficient information to code with codable children: Secondary | ICD-10-CM | POA: Insufficient documentation

## 2013-12-25 DIAGNOSIS — W298XXA Contact with other powered powered hand tools and household machinery, initial encounter: Secondary | ICD-10-CM | POA: Insufficient documentation

## 2013-12-25 DIAGNOSIS — I10 Essential (primary) hypertension: Secondary | ICD-10-CM | POA: Insufficient documentation

## 2013-12-25 DIAGNOSIS — Z7982 Long term (current) use of aspirin: Secondary | ICD-10-CM | POA: Insufficient documentation

## 2013-12-25 DIAGNOSIS — S61209A Unspecified open wound of unspecified finger without damage to nail, initial encounter: Secondary | ICD-10-CM | POA: Insufficient documentation

## 2013-12-25 DIAGNOSIS — Y939 Activity, unspecified: Secondary | ICD-10-CM | POA: Insufficient documentation

## 2013-12-25 DIAGNOSIS — F3289 Other specified depressive episodes: Secondary | ICD-10-CM | POA: Insufficient documentation

## 2013-12-25 DIAGNOSIS — Z8619 Personal history of other infectious and parasitic diseases: Secondary | ICD-10-CM | POA: Insufficient documentation

## 2013-12-25 DIAGNOSIS — Z8669 Personal history of other diseases of the nervous system and sense organs: Secondary | ICD-10-CM | POA: Insufficient documentation

## 2013-12-25 DIAGNOSIS — F329 Major depressive disorder, single episode, unspecified: Secondary | ICD-10-CM | POA: Insufficient documentation

## 2013-12-25 DIAGNOSIS — Y929 Unspecified place or not applicable: Secondary | ICD-10-CM | POA: Insufficient documentation

## 2013-12-25 DIAGNOSIS — Z79899 Other long term (current) drug therapy: Secondary | ICD-10-CM | POA: Insufficient documentation

## 2013-12-25 DIAGNOSIS — Z87891 Personal history of nicotine dependence: Secondary | ICD-10-CM | POA: Insufficient documentation

## 2013-12-25 DIAGNOSIS — S61012A Laceration without foreign body of left thumb without damage to nail, initial encounter: Secondary | ICD-10-CM

## 2013-12-25 MED ORDER — HYDROCODONE-ACETAMINOPHEN 5-325 MG PO TABS: 1.0000 | ORAL_TABLET | Freq: Four times a day (QID) | ORAL | Status: DC | PRN

## 2013-12-25 MED ORDER — CEPHALEXIN 500 MG PO CAPS: 500.0000 mg | ORAL_CAPSULE | Freq: Four times a day (QID) | ORAL | Status: DC

## 2013-12-25 NOTE — ED Provider Notes (Signed)
CSN: [id number]     Arrival date & time 12/25/13  0903 History   First MD Initiated Contact with Patient 12/25/13 [phone]     Chief Complaint  Patient presents with  . Extremity Laceration     (Consider location/radiation/quality/duration/timing/severity/associated sxs/prior Treatment) Patient is a 73 y.o. male presenting with hand injury. The history is provided by the patient.  Hand Injury Location:  Finger Injury: yes   Mechanism of injury comment:  Table saw to L thumb Finger location:  L thumb Pain details:    Quality:  Aching   Severity:  Mild   Onset quality:  Sudden   Duration: <1 hour.   Timing:  Constant   Progression:  Unchanged Chronicity:  New Handedness:  Right-handed Dislocation: no   Tetanus status:  Up to date Prior injury to area:  No Relieved by:  Nothing Associated symptoms: no fever     Past Medical History  Diagnosis Date  . BPH (benign prostatic hyperplasia)   . Chicken pox as child  . [place] measles as a child  . Cancer     skin- on nose- basal cell (20 yrs ago) forehead 1 year ago  . Thrombocytopenia   . Anemia     mild  . Asthma     childhood  . Hyperlipidemia   . Hypertension   . Preventative health care 08/01/2012  . Depression 08/01/2012  . Cerumen impaction 08/10/2012  . Otitis externa 08/10/2012  . Medicare annual wellness visit, subsequent 11/07/2013   Past Surgical History  Procedure Laterality Date  . Hydrocele excision / repair  2012    b/l  . Skin cancer excision      nose and forehead  . Belpharoptosis repair      very young, b/l   Family History  Problem Relation Age of Onset  . Hypertension Mother   . Other Mother     stents  . Heart disease Mother   . Parkinsonism Father   . Heart disease Father   . Cancer Father 51    prostate  . Hypertension Father   . Cancer Maternal Grandfather     prostate ?  . ADD / ADHD Son     ADHD  . Other Son 24    part of 1 lung removed- due to infection  . Stroke Maternal  Grandmother   . Cancer Paternal Grandfather    History  Substance Use Topics  . Smoking status: Former Smoker -- 1.50 packs/day for 15 years    Start date: 10/07/1978  . Smokeless tobacco: Never Used  . Alcohol Use: Yes     Comment: 2 glasses of wine daily    Review of Systems  Constitutional: Negative for fever.  Respiratory: Negative for cough and shortness of breath.   All other systems reviewed and are negative.      Allergies  Review of patient's allergies indicates no known allergies.  Home Medications   Current Outpatient Rx  Name  Route  Sig  Dispense  Refill  . aspirin 81 MG tablet   Oral   Take 81 mg by mouth daily.         Marland Kitchen escitalopram (LEXAPRO) 20 MG tablet   Oral   Take 1 tablet (20 mg total) by mouth daily.   30 tablet   1   . fish oil-omega-3 fatty acids 1000 MG capsule   Oral   Take 2 g by mouth daily.         . fluticasone (  FLONASE) 50 MCG/ACT nasal spray   Each Nare   Place 2 sprays into both nostrils daily.   16 g   2   . hydrochlorothiazide (HYDRODIURIL) 25 MG tablet   Oral   Take 1 tablet (25 mg total) by mouth daily.   30 tablet   3   . IRON COMBINATIONS PO   Oral   Take by mouth.         . neomycin-polymyxin-hydrocortisone (CORTISPORIN) otic solution   Both Ears   Place 3 drops into both ears 4 (four) times daily.   10 mL   1   . NON FORMULARY      Occulite         . simvastatin (ZOCOR) 20 MG tablet   Oral   Take 20 mg by mouth every evening.         . terazosin (HYTRIN) 5 MG capsule   Oral   Take 5 mg by mouth daily.          BP 134/84  Pulse 60  Temp(Src) 97.7 F (36.5 C) (Oral)  Resp 18  SpO2 99% Physical Exam  Nursing note and vitals reviewed. Constitutional: He is oriented to person, place, and time. He appears well-developed and well-nourished. No distress.  HENT:  Head: Normocephalic and atraumatic.  Mouth/Throat: No oropharyngeal exudate.  Eyes: EOM are normal. Pupils are equal,  round, and reactive to light.  Neck: Normal range of motion. Neck supple.  Cardiovascular: Normal rate and regular rhythm.  Exam reveals no friction rub.   No murmur heard. Pulmonary/Chest: Effort normal and breath sounds normal. No respiratory distress. He has no wheezes. He has no rales.  Abdominal: He exhibits no distension. There is no tenderness. There is no rebound.  Musculoskeletal: Normal range of motion. He exhibits no edema.       Hands: Laceration to palmar side of thumb. Normal ROM, normal sensation, hemostatic.  Neurological: He is alert and oriented to person, place, and time.  Skin: He is not diaphoretic.    ED Course  NERVE BLOCK Date/Time: 12/25/2013 3:42 PM Performed by: Osvaldo Shipper Authorized by: Osvaldo Shipper Consent: Verbal consent obtained. Indications: pain relief and extensive wound Body area: upper extremity Nerve: digital Laterality: left Patient sedated: no Preparation: Patient was prepped and draped in the usual sterile fashion. Patient position: sitting Needle gauge: 25 G Location technique: anatomical landmarks Local anesthetic: lidocaine 1% without epinephrine Anesthetic total: 10 ml Outcome: pain improved Patient tolerance: Patient tolerated the procedure well with no immediate complications.  LACERATION REPAIR Date/Time: 12/25/2013 3:43 PM Performed by: Osvaldo Shipper Authorized by: Osvaldo Shipper Consent: Verbal consent obtained. Body area: upper extremity Location details: left thumb Laceration length: 6 cm Foreign bodies: no foreign bodies Tendon involvement: none Nerve involvement: none Vascular damage: no Anesthesia: digital block Patient sedated: no Preparation: Patient was prepped and draped in the usual sterile fashion. Irrigation solution: saline Irrigation method: jet lavage Amount of cleaning: extensive Debridement: none Degree of undermining: none Wound skin closure material used: 5-0  Vicryl. Subcutaneous closure: 5-0 Vicryl Number of sutures: 9 Technique: simple Approximation: close Approximation difficulty: complex (Large gaping wound in thumb from the table saw) Dressing: 4x4 sterile gauze Patient tolerance: Patient tolerated the procedure well with no immediate complications.   (including critical care time) Labs Review Labs Reviewed - No data to display Imaging Review Dg Finger Thumb Left  12/25/2013   CLINICAL DATA:  Left thumb laceration with a table saw  EXAM: LEFT THUMB 2+V  COMPARISON:  None  FINDINGS: Soft tissue irregularity left thumb.  Joint spaces preserved.  Minimal spurring at IP joint thumb.  Lucency identified at the base of the proximal phalanx without definite visualize cortical disruption, uncertain if represents an artifact from asymmetric superimposed soft tissues or area of bone loss.  No bony debris or radiopaque foreign bodies identified.  No additional fracture, dislocation or bone destruction.  IMPRESSION: Minimal degenerative changes at IP joint thumb.  Longitudinal lucency at the base of the distal phalanx could represent artifact from though linear area of bone loss/injury not excluded.   Electronically Signed   By: Lavonia Dana M.D.   On: 12/25/2013 10:00     EKG Interpretation None      MDM   Final diagnoses:  Laceration of thumb, left, complicated    82U cut his L thumb on a table saw this morning. R hand dominant, tetanus UTD. L thumb with laceration extending length of palmar side. Normal ROM, neurovascularly intact. Will xray. Xray with small bone defect, I spoke with Dr. Fredna Dow, will splint and put on keflex. Repair as above. Thumb spica placed.  Osvaldo Shipper, MD 12/25/13 [phone]

## 2013-12-25 NOTE — Discharge Instructions (Signed)
Fingertip Injuries and Amputations Fingertip injuries are common and often get injured because they are last to escape when pulling your hand out of harm's way. You have amputated (cut off) part of your finger. How this turns out depends largely on how much was amputated. If just the tip is amputated, often the end of the finger will grow back and the finger may return to much the same as it was before the injury.  If more of the finger is missing, your caregiver has done the best with the tissue remaining to allow you to keep as much finger as is possible. Your caregiver after checking your injury has tried to leave you with a painless fingertip that has durable, feeling skin. If possible, your caregiver has tried to maintain the finger's length and appearance and preserve its fingernail.  Please read the instructions outlined below and refer to this sheet in the next few weeks. These instructions provide you with general information on caring for yourself. Your caregiver may also give you specific instructions. While your treatment has been done according to the most current medical practices available, unavoidable complications occasionally occur. If you have any problems or questions after discharge, please call your caregiver. HOME CARE INSTRUCTIONS   You may resume normal diet and activities as directed or allowed.  Keep your hand elevated above the level of your heart. This helps decrease pain and swelling.  Keep ice packs (or a bag of ice wrapped in a towel) on the injured area for 15-20 minutes, 03-04 times per day, for the first two days.  Change dressings if necessary or as directed.  Clean the wound daily or as directed.  Only take over-the-counter or prescription medicines for pain, discomfort, or fever as directed by your caregiver.  Keep appointments as directed. SEEK IMMEDIATE MEDICAL CARE IF:  You develop redness, swelling, numbness or increasing pain in the wound.  There is  pus coming from the wound.  You develop an unexplained oral temperature above 102 F (38.9 C) or as your caregiver suggests.  There is a foul (bad) smell coming from the wound or dressing.  There is a breaking open of the wound (edges not staying together) after sutures or staples have been removed. MAKE SURE YOU:   Understand these instructions.  Will watch your condition.  Will get help right away if you are not doing well or get worse. Document Released: 08/14/2005 Document Revised: 12/16/2011 Document Reviewed: 07/13/2008 [facility] Patient Information 2014 [place], [place].

## 2013-12-25 NOTE — ED Notes (Signed)
Laceration to L thumb with table saw, bleeding controlled

## 2013-12-31 ENCOUNTER — Telehealth: Payer: Self-pay | Admitting: Family Medicine

## 2013-12-31 DIAGNOSIS — I10 Essential (primary) hypertension: Secondary | ICD-10-CM

## 2013-12-31 NOTE — Telephone Encounter (Signed)
Patient states that he would like a new prescription of escitalopram, simvastatin, and hydrochlorothiazide sent to [place]. 90 day supply on medications

## 2014-01-03 MED ORDER — ESCITALOPRAM OXALATE 20 MG PO TABS: 20.0000 mg | ORAL_TABLET | Freq: Every day | ORAL | Status: DC

## 2014-01-03 MED ORDER — SIMVASTATIN 20 MG PO TABS: 20.0000 mg | ORAL_TABLET | Freq: Every evening | ORAL | Status: DC

## 2014-01-03 MED ORDER — HYDROCHLOROTHIAZIDE 25 MG PO TABS: 25.0000 mg | ORAL_TABLET | Freq: Every day | ORAL | Status: DC

## 2014-01-06 ENCOUNTER — Other Ambulatory Visit: Payer: Self-pay

## 2014-01-06 DIAGNOSIS — I10 Essential (primary) hypertension: Secondary | ICD-10-CM

## 2014-01-06 MED ORDER — ESCITALOPRAM OXALATE 20 MG PO TABS: 20.0000 mg | ORAL_TABLET | Freq: Every day | ORAL | Status: DC

## 2014-01-06 MED ORDER — HYDROCHLOROTHIAZIDE 25 MG PO TABS: 25.0000 mg | ORAL_TABLET | Freq: Every day | ORAL | Status: DC

## 2014-01-06 MED ORDER — SIMVASTATIN 20 MG PO TABS: 20.0000 mg | ORAL_TABLET | Freq: Every evening | ORAL | Status: DC

## 2014-02-22 LAB — CBC
HCT: 41.4 % (ref 39.0–52.0)
Hemoglobin: 15 g/dL (ref 13.0–17.0)
MCH: 31.6 pg (ref 26.0–34.0)
MCHC: 36.2 g/dL — AB (ref 30.0–36.0)
MCV: 87.2 fL (ref 78.0–100.0)
PLATELETS: 140 10*3/uL — AB (ref 150–400)
RBC: 4.75 MIL/uL (ref 4.22–5.81)
RDW: 13.6 % (ref 11.5–15.5)
WBC: 7 10*3/uL (ref 4.0–10.5)

## 2014-02-22 LAB — RENAL FUNCTION PANEL
ALBUMIN: 4.2 g/dL (ref 3.5–5.2)
BUN: 20 mg/dL (ref 6–23)
CO2: 27 meq/L (ref 19–32)
CREATININE: 1.13 mg/dL (ref 0.50–1.35)
Calcium: 9.8 mg/dL (ref 8.4–10.5)
Chloride: 103 mEq/L (ref 96–112)
GLUCOSE: 87 mg/dL (ref 70–99)
Phosphorus: 2.5 mg/dL (ref 2.3–4.6)
Potassium: 4 mEq/L (ref 3.5–5.3)
Sodium: 140 mEq/L (ref 135–145)

## 2014-02-22 LAB — LIPID PANEL
Cholesterol: 181 mg/dL (ref 0–200)
HDL: 67 mg/dL (ref >39)
LDL CALC: 90 mg/dL (ref 0–99)
TRIGLYCERIDES: 122 mg/dL (ref <150)
Total CHOL/HDL Ratio: 2.7 Ratio
VLDL: 24 mg/dL (ref 0–40)

## 2014-02-22 LAB — HEPATIC FUNCTION PANEL
ALK PHOS: 47 U/L (ref 39–117)
ALT: 15 U/L (ref 0–53)
AST: 26 U/L (ref 0–37)
Albumin: 4.2 g/dL (ref 3.5–5.2)
Bilirubin, Direct: 0.3 mg/dL (ref 0.0–0.3)
Indirect Bilirubin: 1.3 mg/dL — ABNORMAL HIGH (ref 0.2–1.2)
Total Bilirubin: 1.6 mg/dL — ABNORMAL HIGH (ref 0.2–1.2)
Total Protein: 6.5 g/dL (ref 6.0–8.3)

## 2014-02-22 LAB — TSH: TSH: 1.066 u[IU]/mL (ref 0.350–4.500)

## 2014-03-03 ENCOUNTER — Ambulatory Visit (HOSPITAL_BASED_OUTPATIENT_CLINIC_OR_DEPARTMENT_OTHER)
Admission: RE | Admit: 2014-03-03 | Discharge: 2014-03-03 | Disposition: A | Discharge status: Home / Self Care | Payer: Medicare HMO | Source: Ambulatory Visit | Attending: Family Medicine | Admitting: Family Medicine

## 2014-03-03 ENCOUNTER — Ambulatory Visit (INDEPENDENT_AMBULATORY_CARE_PROVIDER_SITE_OTHER): Payer: Medicare HMO | Admitting: Family Medicine

## 2014-03-03 ENCOUNTER — Encounter: Payer: Self-pay | Admitting: Family Medicine

## 2014-03-03 VITALS — BP 130/78 | HR 54 | Temp 97.8°F | Ht 66.0 in | Wt 180.1 lb

## 2014-03-03 DIAGNOSIS — I10 Essential (primary) hypertension: Secondary | ICD-10-CM

## 2014-03-03 DIAGNOSIS — R030 Elevated blood-pressure reading, without diagnosis of hypertension: Secondary | ICD-10-CM

## 2014-03-03 DIAGNOSIS — F32A Depression, unspecified: Secondary | ICD-10-CM

## 2014-03-03 DIAGNOSIS — IMO0001 Reserved for inherently not codable concepts without codable children: Secondary | ICD-10-CM

## 2014-03-03 DIAGNOSIS — E785 Hyperlipidemia, unspecified: Secondary | ICD-10-CM

## 2014-03-03 DIAGNOSIS — D696 Thrombocytopenia, unspecified: Secondary | ICD-10-CM

## 2014-03-03 DIAGNOSIS — F329 Major depressive disorder, single episode, unspecified: Secondary | ICD-10-CM

## 2014-03-03 DIAGNOSIS — F3289 Other specified depressive episodes: Secondary | ICD-10-CM

## 2014-03-03 DIAGNOSIS — E663 Overweight: Secondary | ICD-10-CM

## 2014-03-03 DIAGNOSIS — R17 Unspecified jaundice: Secondary | ICD-10-CM | POA: Insufficient documentation

## 2014-03-03 NOTE — Progress Notes (Signed)
Pre visit review using our clinic review tool, if applicable. No additional management support is needed unless otherwise documented below in the visit note. 

## 2014-03-03 NOTE — Patient Instructions (Signed)
Cholelithiasis °Cholelithiasis (also called gallstones) is a form of gallbladder disease in which gallstones form in your gallbladder. The gallbladder is an organ that stores bile made in the liver, which helps digest fats. Gallstones begin as small crystals and slowly grow into stones. Gallstone pain occurs when the gallbladder spasms and a gallstone is blocking the duct. Pain can also occur when a stone passes out of the duct.  °RISK FACTORS °· Being male.   °· Having multiple pregnancies. Health care providers sometimes advise removing diseased gallbladders before future pregnancies.   °· Being obese. °· Eating a diet heavy in fried foods and fat.   °· Being older than 60 years and increasing age.   °· Prolonged use of medicines containing male hormones.   °· Having diabetes mellitus.   °· Rapidly losing weight.   °· Having a family history of gallstones (heredity).   °SYMPTOMS °· Nausea.   °· Vomiting. °· Abdominal pain.   °· Yellowing of the skin (jaundice).   °· Sudden pain. It may persist from several minutes to several hours. °· Fever.   °· Tenderness to the touch.  °In some cases, when gallstones do not move into the bile duct, people have no pain or symptoms. These are called "silent" gallstones.  °TREATMENT °Silent gallstones do not need treatment. In severe cases, emergency surgery may be required. Options for treatment include: °· Surgery to remove the gallbladder. This is the most common treatment. °· Medicines. These do not always work and may take 6 12 months or more to work. °· Shock wave treatment (extracorporeal biliary lithotripsy). In this treatment an ultrasound machine sends shock waves to the gallbladder to break gallstones into smaller pieces that can pass into the intestines or be dissolved by medicine. °HOME CARE INSTRUCTIONS  °· Only take over-the-counter or prescription medicines for pain, discomfort, or fever as directed by your health care provider.   °· Follow a low-fat diet until  seen again by your health care provider. Fat causes the gallbladder to contract, which can result in pain.   °· Follow up with your health care provider as directed. Attacks are almost always recurrent and surgery is usually required for permanent treatment.   °SEEK IMMEDIATE MEDICAL CARE IF:  °· Your pain increases and is not controlled by medicines.   °· You have a fever or persistent symptoms for more than 2 3 days.   °· You have a fever and your symptoms suddenly get worse.   °· You have persistent nausea and vomiting.   °MAKE SURE YOU:  °· Understand these instructions. °· Will watch your condition. °· Will get help right away if you are not doing well or get worse. °Document Released: 09/19/2005 Document Revised: 05/26/2013 Document Reviewed: 03/17/2013 °[employer]® Patient Information ©2014 [employer]. ° °

## 2014-03-03 NOTE — Assessment & Plan Note (Signed)
Well controlled, no changes to meds. Encouraged heart healthy diet such as the DASH diet and exercise as tolerated.  °

## 2014-03-03 NOTE — Assessment & Plan Note (Addendum)
Stable, mild, asymptomatic 

## 2014-03-06 ENCOUNTER — Encounter: Payer: Self-pay | Admitting: Family Medicine

## 2014-03-06 DIAGNOSIS — E663 Overweight: Secondary | ICD-10-CM | POA: Insufficient documentation

## 2014-03-06 NOTE — Assessment & Plan Note (Signed)
Encouraged DASH diet, decrease po intake and increase exercise as tolerated. Needs 7-8 hours of sleep nightly. Avoid trans fats, eat small, frequent meals every 4-5 hours with lean proteins, complex carbs and healthy fats. Minimize simple carbs, GMO foods. 

## 2014-03-06 NOTE — Progress Notes (Signed)
Patient ID: Walter Brown, male   DOB: [birth year], 73 y.o.   MRN: [id number] Walter Brown [id number] 03-28-1941 03/06/2014      Progress Note-Follow Up  Subjective  Chief Complaint  Chief Complaint  Patient presents with  . Follow-up    4 month    HPI  Patient is a 73 year old male in today for routine medical care. He is in today for followup. He has been attempting to lose weight by cutting down and tied the ER healthy diet. He feels well. He denies any recent illness. Denies CP/palp/SOB/HA/congestion/fevers/GI or GU c/o. Taking meds as prescribed  Past Medical History  Diagnosis Date  . BPH (benign prostatic hyperplasia)   . Chicken pox as child  . [place] measles as a child  . Cancer     skin- on nose- basal cell (20 yrs ago) forehead 1 year ago  . Thrombocytopenia   . Anemia     mild  . Asthma     childhood  . Hyperlipidemia   . Hypertension   . Preventative health care 08/01/2012  . Depression 08/01/2012  . Cerumen impaction 08/10/2012  . Otitis externa 08/10/2012  . Medicare annual wellness visit, subsequent 11/07/2013    Past Surgical History  Procedure Laterality Date  . Hydrocele excision / repair  2012    b/l  . Skin cancer excision      nose and forehead  . Belpharoptosis repair      very young, b/l    Family History  Problem Relation Age of Onset  . Hypertension Mother   . Other Mother     stents  . Heart disease Mother   . Parkinsonism Father   . Heart disease Father   . Cancer Father 33    prostate  . Hypertension Father   . Cancer Maternal Grandfather     prostate ?  . ADD / ADHD Son     ADHD  . Other Son 24    part of 1 lung removed- due to infection  . Stroke Maternal Grandmother   . Cancer Paternal Grandfather     History   Social History  . Marital Status: Married    Spouse Name: N/A    Number of Children: N/A  . Years of Education: N/A   Occupational History  . Not on file.   Social History Main Topics  . Smoking status:  Former Smoker -- 1.50 packs/day for 15 years    Start date: 10/07/1978  . Smokeless tobacco: Never Used  . Alcohol Use: Yes     Comment: 2 glasses of wine daily  . Drug Use: No  . Sexual Activity: Yes   Other Topics Concern  . Not on file   Social History Narrative  . No narrative on file    Current Outpatient Prescriptions on File Prior to Visit  Medication Sig Dispense Refill  . aspirin 81 MG tablet Take 81 mg by mouth daily.      Marland Kitchen escitalopram (LEXAPRO) 20 MG tablet Take 1 tablet (20 mg total) by mouth daily.  90 tablet  1  . fish oil-omega-3 fatty acids 1000 MG capsule Take 2 g by mouth daily.      . hydrochlorothiazide (HYDRODIURIL) 25 MG tablet Take 1 tablet (25 mg total) by mouth daily.  90 tablet  1  . HYDROcodone-acetaminophen (NORCO/VICODIN) 5-325 MG per tablet Take 1 tablet by mouth every 6 (six) hours as needed for moderate pain.  20 tablet  0  .  IRON COMBINATIONS PO Take by mouth.      . NON FORMULARY Occulite      . simvastatin (ZOCOR) 20 MG tablet Take 1 tablet (20 mg total) by mouth every evening.  90 tablet  1  . terazosin (HYTRIN) 5 MG capsule Take 5 mg by mouth daily.      . fluticasone (FLONASE) 50 MCG/ACT nasal spray Place 2 sprays into both nostrils daily.  16 g  2   No current facility-administered medications on file prior to visit.    No Known Allergies  Review of Systems  Review of Systems  Constitutional: Negative for fever and malaise/fatigue.  HENT: Negative for congestion.   Eyes: Negative for discharge.  Respiratory: Negative for shortness of breath.   Cardiovascular: Negative for chest pain, palpitations and leg swelling.  Gastrointestinal: Negative for nausea, abdominal pain and diarrhea.  Genitourinary: Negative for dysuria.  Musculoskeletal: Negative for falls.  Skin: Negative for rash.  Neurological: Negative for loss of consciousness and headaches.  Endo/Heme/Allergies: Negative for polydipsia.  Psychiatric/Behavioral: Negative for  depression and suicidal ideas. The patient is not nervous/anxious and does not have insomnia.     Objective  BP 130/78  Pulse 54  Temp(Src) 97.8 F (36.6 C) (Oral)  Ht 5\' 6"  (1.676 m)  Wt 180 lb 1.9 oz (81.702 kg)  BMI 29.09 kg/m2  SpO2 97%  Physical Exam  Physical Exam  Constitutional: He is oriented to person, place, and time and well-developed, well-nourished, and in no distress. No distress.  HENT:  Head: Normocephalic and atraumatic.  Eyes: Conjunctivae are normal.  Neck: Neck supple. No thyromegaly present.  Cardiovascular: Normal rate, regular rhythm and normal heart sounds.   No murmur heard. Pulmonary/Chest: Effort normal and breath sounds normal. No respiratory distress.  Abdominal: He exhibits no distension and no mass. There is no tenderness.  Musculoskeletal: He exhibits no edema.  Neurological: He is alert and oriented to person, place, and time.  Skin: Skin is warm.  Psychiatric: Memory, affect and judgment normal.    Lab Results  Component Value Date   TSH 1.066 02/22/2014   Lab Results  Component Value Date   WBC 7.0 02/22/2014   HGB 15.0 02/22/2014   HCT 41.4 02/22/2014   MCV 87.2 02/22/2014   PLT 140* 02/22/2014   Lab Results  Component Value Date   CREATININE 1.13 02/22/2014   BUN 20 02/22/2014   NA 140 02/22/2014   K 4.0 02/22/2014   CL 103 02/22/2014   CO2 27 02/22/2014   Lab Results  Component Value Date   ALT 15 02/22/2014   AST 26 02/22/2014   ALKPHOS 47 02/22/2014   BILITOT 1.6* 02/22/2014   Lab Results  Component Value Date   CHOL 181 02/22/2014   Lab Results  Component Value Date   HDL 67 02/22/2014   Lab Results  Component Value Date   LDLCALC 90 02/22/2014   Lab Results  Component Value Date   TRIG 122 02/22/2014   Lab Results  Component Value Date   CHOLHDL 2.7 02/22/2014     Assessment & Plan  Thrombocytopenia Stable, mild, asymptomatic  Hypertension Well controlled, no changes to meds. Encouraged heart healthy diet  such as the DASH diet and exercise as tolerated.   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 Needs 7-8 hours of sleep nightly. Avoid trans fats, eat small, frequent meals every 4-5 hours with  lean proteins, complex carbs and healthy fats. Minimize simple carbs, GMO foods.  Hyperlipidemia Tolerating statin, encouraged heart healthy diet, avoid trans fats, minimize simple carbs and saturated fats. Increase exercise as tolerated  Depression Doing well on Escitalopram

## 2014-03-06 NOTE — Assessment & Plan Note (Signed)
Tolerating statin, encouraged heart healthy diet, avoid trans fats, minimize simple carbs and saturated fats. Increase exercise as tolerated 

## 2014-03-06 NOTE — Assessment & Plan Note (Signed)
Doing well on Escitalopram 

## 2014-06-16 ENCOUNTER — Ambulatory Visit (INDEPENDENT_AMBULATORY_CARE_PROVIDER_SITE_OTHER): Payer: Medicare HMO | Admitting: Licensed Clinical Social Worker

## 2014-06-16 DIAGNOSIS — F411 Generalized anxiety disorder: Secondary | ICD-10-CM

## 2014-06-21 ENCOUNTER — Ambulatory Visit (INDEPENDENT_AMBULATORY_CARE_PROVIDER_SITE_OTHER): Payer: Medicare HMO | Admitting: Family Medicine

## 2014-06-21 ENCOUNTER — Encounter: Payer: Self-pay | Admitting: Family Medicine

## 2014-06-21 ENCOUNTER — Ambulatory Visit: Payer: Medicare HMO | Admitting: Licensed Clinical Social Worker

## 2014-06-21 VITALS — BP 131/60 | HR 62 | Temp 98.0°F | Ht 66.0 in | Wt 184.0 lb

## 2014-06-21 DIAGNOSIS — Z23 Encounter for immunization: Secondary | ICD-10-CM

## 2014-06-21 DIAGNOSIS — I10 Essential (primary) hypertension: Secondary | ICD-10-CM

## 2014-06-21 DIAGNOSIS — F341 Dysthymic disorder: Secondary | ICD-10-CM

## 2014-06-21 DIAGNOSIS — E785 Hyperlipidemia, unspecified: Secondary | ICD-10-CM

## 2014-06-21 DIAGNOSIS — F329 Major depressive disorder, single episode, unspecified: Secondary | ICD-10-CM

## 2014-06-21 DIAGNOSIS — F32A Depression, unspecified: Secondary | ICD-10-CM

## 2014-06-21 DIAGNOSIS — E663 Overweight: Secondary | ICD-10-CM

## 2014-06-21 DIAGNOSIS — F418 Other specified anxiety disorders: Secondary | ICD-10-CM

## 2014-06-21 DIAGNOSIS — F3289 Other specified depressive episodes: Secondary | ICD-10-CM

## 2014-06-21 MED ORDER — SERTRALINE HCL 50 MG PO TABS: 50.0000 mg | ORAL_TABLET | Freq: Every day | ORAL | Status: DC

## 2014-06-21 NOTE — Patient Instructions (Addendum)
Take 1/2 tab daily of Lexapro x 7 days and 1/2 tab daily of Sertraline 50 mg for 7 days then stsop Lexapro and go to full tab of Sertraline   Nasal saline flush to nose after woodwork or outside   Cholesterol Cholesterol is a white, waxy, fat-like substance needed by your body in small amounts. The liver makes all the cholesterol you need. Cholesterol is carried from the liver by the blood through the blood vessels. Deposits of cholesterol (plaque) may build up on blood vessel walls. These make the arteries narrower and stiffer. Cholesterol plaques increase the risk for heart attack and stroke.  You cannot feel your cholesterol level even if it is very high. The only way to know it is high is with a blood test. Once you know your cholesterol levels, you should keep a record of the test results. Work with your health care provider to keep your levels in the desired range.  WHAT DO THE RESULTS MEAN?  Total cholesterol is a rough measure of all the cholesterol in your blood.   LDL is the so-called bad cholesterol. This is the type that deposits cholesterol in the walls of the arteries. You want this level to be low.   HDL is the good cholesterol because it cleans the arteries and carries the LDL away. You want this level to be high.  Triglycerides are fat that the body can either burn for energy or store. High levels are closely linked to heart disease.  WHAT ARE THE DESIRED LEVELS OF CHOLESTEROL?  Total cholesterol below 200.   LDL below 100 for people at risk, below 70 for those at very high risk.   HDL above 50 is good, above 60 is best.   Triglycerides below 150.  HOW CAN I LOWER MY CHOLESTEROL?  Diet. Follow your diet programs as directed by your health care provider.   Choose fish or white meat chicken and [place], roasted or baked. Limit fatty cuts of red meat, fried foods, and processed meats, such as sausage and lunch meats.   Eat lots of fresh fruits and  vegetables.  Choose whole grains, beans, pasta, potatoes, and cereals.   Use only small amounts of olive, corn, or canola oils.   Avoid butter, mayonnaise, shortening, or palm kernel oils.  Avoid foods with trans fats.   Drink skim or nonfat milk and eat low-fat or nonfat yogurt and cheeses. Avoid whole milk, cream, ice cream, egg yolks, and full-fat cheeses.   Healthy desserts include angel food cake, ginger snaps, animal crackers, hard candy, popsicles, and low-fat or nonfat frozen yogurt. Avoid pastries, cakes, pies, and cookies.   Exercise. Follow your exercise programs as directed by your health care provider.   A regular program helps decrease LDL and raise HDL.   A regular program helps with weight control.   Do things that increase your activity level like gardening, walking, or taking the stairs. Ask your health care provider about how you can be more active in your daily life.   Medicine. Take medicine only as directed by your health care provider.   Medicine may be prescribed by your health care provider to help lower cholesterol and decrease the risk for heart disease.   If you have several risk factors, you may need medicine even if your levels are normal. Document Released: 06/18/2001 Document Revised: 02/07/2014 Document Reviewed: 07/07/2013 [facility] Patient Information 2015 [place], [place]. This information is not intended to replace advice given to you by your health  care provider. Make sure you discuss any questions you have with your health care provider.

## 2014-06-21 NOTE — Progress Notes (Signed)
Pre visit review using our clinic review tool, if applicable. No additional management support is needed unless otherwise documented below in the visit note. 

## 2014-06-22 LAB — HEPATIC FUNCTION PANEL
ALBUMIN: 4.1 g/dL (ref 3.5–5.2)
ALT: 19 U/L (ref 0–53)
AST: 27 U/L (ref 0–37)
Alkaline Phosphatase: 53 U/L (ref 39–117)
Bilirubin, Direct: 0.1 mg/dL (ref 0.0–0.3)
TOTAL PROTEIN: 6.8 g/dL (ref 6.0–8.3)
Total Bilirubin: 0.7 mg/dL (ref 0.2–1.2)

## 2014-06-22 LAB — LIPID PANEL
Cholesterol: 148 mg/dL (ref 0–200)
HDL: 66.6 mg/dL (ref >39.00)
LDL Cholesterol: 67 mg/dL (ref 0–99)
NONHDL: 81.4
Total CHOL/HDL Ratio: 2
Triglycerides: 74 mg/dL (ref 0.0–149.0)
VLDL: 14.8 mg/dL (ref 0.0–40.0)

## 2014-06-22 LAB — RENAL FUNCTION PANEL
ALBUMIN: 4.1 g/dL (ref 3.5–5.2)
BUN: 18 mg/dL (ref 6–23)
CALCIUM: 8.8 mg/dL (ref 8.4–10.5)
CO2: 28 meq/L (ref 19–32)
Chloride: 103 mEq/L (ref 96–112)
Creatinine, Ser: 1.2 mg/dL (ref 0.4–1.5)
GFR: 65.56 mL/min (ref >60.00)
GLUCOSE: 84 mg/dL (ref 70–99)
Phosphorus: 2.9 mg/dL (ref 2.3–4.6)
Potassium: 3.9 mEq/L (ref 3.5–5.1)
Sodium: 138 mEq/L (ref 135–145)

## 2014-06-22 LAB — CBC
HCT: 41.4 % (ref 39.0–52.0)
HEMOGLOBIN: 14.1 g/dL (ref 13.0–17.0)
MCHC: 34 g/dL (ref 30.0–36.0)
MCV: 93.1 fl (ref 78.0–100.0)
PLATELETS: 143 10*3/uL — AB (ref 150.0–400.0)
RBC: 4.45 Mil/uL (ref 4.22–5.81)
RDW: 13.7 % (ref 11.5–15.5)
WBC: 7.7 10*3/uL (ref 4.0–10.5)

## 2014-06-22 LAB — TSH: TSH: 0.8 u[IU]/mL (ref 0.35–4.50)

## 2014-06-26 ENCOUNTER — Encounter: Payer: Self-pay | Admitting: Family Medicine

## 2014-06-26 NOTE — Assessment & Plan Note (Signed)
Well controlled, no changes to meds. Encouraged heart healthy diet such as the DASH diet and exercise as tolerated.  °

## 2014-06-26 NOTE — Assessment & Plan Note (Signed)
Encouraged DASH diet, decrease po intake and increase exercise as tolerated. Needs 7-8 hours of sleep nightly. Avoid trans fats, eat small, frequent meals every 4-5 hours with lean proteins, complex carbs and healthy fats. Minimize simple carbs, GMO foods. 

## 2014-06-26 NOTE — Assessment & Plan Note (Signed)
Tolerating statin, encouraged heart healthy diet, avoid trans fats, minimize simple carbs and saturated fats. Increase exercise as tolerated 

## 2014-06-26 NOTE — Assessment & Plan Note (Signed)
Has been struggling lately but did go for counseling session and that was helpful.

## 2014-06-26 NOTE — Progress Notes (Signed)
Patient ID: Walter Brown, male   DOB: 1941-06-04, 73 y.o.   MRN: [id number] JADARION HALBIG [id number] [birth year] 06/26/2014      Progress Note-Follow Up  Subjective  Chief Complaint  Chief Complaint  Patient presents with  . Follow-up    4 month  . Injections    flu    HPI  Patient is a 73 year old male in today for routine medical care. Patient is in today for followup. Is noting a recent flare in some depression but is managing with some counseling and medications. Is exercising regularly roughly 5 times a week. Complains of some left thumb pain and swelling. Sugars have been in the 180s no polyuria or polydipsia. Denies CP/palp/SOB/HA/congestion/fevers/GI or GU c/o. Taking meds as prescribed  Past Medical History  Diagnosis Date  . BPH (benign prostatic hyperplasia)   . Chicken pox as child  . [place] measles as a child  . Cancer     skin- on nose- basal cell (20 yrs ago) forehead 1 year ago  . Thrombocytopenia   . Anemia     mild  . Asthma     childhood  . Hyperlipidemia   . Hypertension   . Preventative health care 08/01/2012  . Depression 08/01/2012  . Cerumen impaction 08/10/2012  . Otitis externa 08/10/2012  . Medicare annual wellness visit, subsequent 11/07/2013    Past Surgical History  Procedure Laterality Date  . Hydrocele excision / repair  2012    b/l  . Skin cancer excision      nose and forehead  . Belpharoptosis repair      very young, b/l    Family History  Problem Relation Age of Onset  . Hypertension Mother   . Other Mother     stents  . Heart disease Mother   . Parkinsonism Father   . Heart disease Father   . Cancer Father 2    prostate  . Hypertension Father   . Cancer Maternal Grandfather     prostate ?  . ADD / ADHD Son     ADHD  . Other Son 24    part of 1 lung removed- due to infection  . Stroke Maternal Grandmother   . Cancer Paternal Grandfather     History   Social History  . Marital Status: Married    Spouse Name: N/A     Number of Children: N/A  . Years of Education: N/A   Occupational History  . Not on file.   Social History Main Topics  . Smoking status: Former Smoker -- 1.50 packs/day for 15 years    Start date: 10/07/1978  . Smokeless tobacco: Never Used  . Alcohol Use: Yes     Comment: 2 glasses of wine daily  . Drug Use: No  . Sexual Activity: Yes   Other Topics Concern  . Not on file   Social History Narrative  . No narrative on file    Current Outpatient Prescriptions on File Prior to Visit  Medication Sig Dispense Refill  . aspirin 81 MG tablet Take 81 mg by mouth daily.      . fish oil-omega-3 fatty acids 1000 MG capsule Take 2 g by mouth daily.      . fluticasone (FLONASE) 50 MCG/ACT nasal spray Place 2 sprays into both nostrils daily.  16 g  2  . hydrochlorothiazide (HYDRODIURIL) 25 MG tablet Take 1 tablet (25 mg total) by mouth daily.  90 tablet  1  . IRON  COMBINATIONS PO Take by mouth.      . NON FORMULARY Occulite      . simvastatin (ZOCOR) 20 MG tablet Take 1 tablet (20 mg total) by mouth every evening.  90 tablet  1  . terazosin (HYTRIN) 5 MG capsule Take 5 mg by mouth daily.       No current facility-administered medications on file prior to visit.    No Known Allergies  Review of Systems  Review of Systems  Constitutional: Negative for fever and malaise/fatigue.  HENT: Negative for congestion.   Eyes: Negative for discharge.  Respiratory: Negative for shortness of breath.   Cardiovascular: Negative for chest pain, palpitations and leg swelling.  Gastrointestinal: Negative for nausea, abdominal pain and diarrhea.  Genitourinary: Negative for dysuria.  Musculoskeletal: Negative for falls.  Skin: Negative for rash.  Neurological: Negative for loss of consciousness and headaches.  Endo/Heme/Allergies: Negative for polydipsia.  Psychiatric/Behavioral: Negative for depression and suicidal ideas. The patient is not nervous/anxious and does not have insomnia.      Objective  BP 131/60  Pulse 62  Temp(Src) 98 F (36.7 C) (Oral)  Ht 5\' 6"  (1.676 m)  Wt 184 lb (83.462 kg)  BMI 29.71 kg/m2  SpO2 100%  Physical Exam  Physical Exam  Constitutional: He is oriented to person, place, and time and well-developed, well-nourished, and in no distress. No distress.  HENT:  Head: Normocephalic and atraumatic.  Eyes: Conjunctivae are normal.  Neck: Neck supple. No thyromegaly present.  Cardiovascular: Normal rate, regular rhythm and normal heart sounds.   No murmur heard. Pulmonary/Chest: Effort normal and breath sounds normal. No respiratory distress.  Abdominal: He exhibits no distension and no mass. There is no tenderness.  Musculoskeletal: He exhibits no edema.  Neurological: He is alert and oriented to person, place, and time.  Skin: Skin is warm.  Psychiatric: Memory, affect and judgment normal.    Lab Results  Component Value Date   TSH 0.80 06/21/2014   Lab Results  Component Value Date   WBC 7.7 06/21/2014   HGB 14.1 06/21/2014   HCT 41.4 06/21/2014   MCV 93.1 06/21/2014   PLT 143.0* 06/21/2014   Lab Results  Component Value Date   CREATININE 1.2 06/21/2014   BUN 18 06/21/2014   NA 138 06/21/2014   K 3.9 06/21/2014   CL 103 06/21/2014   CO2 28 06/21/2014   Lab Results  Component Value Date   ALT 19 06/21/2014   AST 27 06/21/2014   ALKPHOS 53 06/21/2014   BILITOT 0.7 06/21/2014   Lab Results  Component Value Date   CHOL 148 06/21/2014   Lab Results  Component Value Date   HDL 66.60 06/21/2014   Lab Results  Component Value Date   LDLCALC 67 06/21/2014   Lab Results  Component Value Date   TRIG 74.0 06/21/2014   Lab Results  Component Value Date   CHOLHDL 2 06/21/2014     Assessment & Plan  Hypertension Well controlled, no changes to meds. Encouraged heart healthy diet such as the DASH diet and exercise as tolerated.   Hyperlipidemia Tolerating statin, encouraged heart healthy diet, avoid trans fats, minimize simple  carbs and saturated fats. Increase exercise as tolerated  Overweight Encouraged DASH diet, decrease po intake and increase exercise as tolerated. Needs 7-8 hours of sleep nightly. Avoid trans fats, eat small, frequent meals every 4-5 hours with lean proteins, complex carbs and healthy fats. Minimize simple carbs, GMO foods.  Depression Has been struggling  lately but did go for counseling session and that was helpful.

## 2014-07-26 ENCOUNTER — Other Ambulatory Visit: Payer: Self-pay | Admitting: Family Medicine

## 2014-09-22 ENCOUNTER — Encounter: Payer: Self-pay | Admitting: Family Medicine

## 2014-09-22 ENCOUNTER — Ambulatory Visit (INDEPENDENT_AMBULATORY_CARE_PROVIDER_SITE_OTHER): Payer: Medicare HMO | Admitting: Family Medicine

## 2014-09-22 VITALS — BP 134/69 | HR 58 | Temp 97.7°F | Wt 185.6 lb

## 2014-09-22 DIAGNOSIS — I10 Essential (primary) hypertension: Secondary | ICD-10-CM

## 2014-09-22 DIAGNOSIS — E663 Overweight: Secondary | ICD-10-CM

## 2014-09-22 DIAGNOSIS — M25511 Pain in right shoulder: Secondary | ICD-10-CM

## 2014-09-22 DIAGNOSIS — E785 Hyperlipidemia, unspecified: Secondary | ICD-10-CM

## 2014-09-22 NOTE — Patient Instructions (Signed)

## 2014-09-22 NOTE — Progress Notes (Signed)
Pre visit review using our clinic review tool, if applicable. No additional management support is needed unless otherwise documented below in the visit note. 

## 2014-09-26 ENCOUNTER — Encounter: Payer: Self-pay | Admitting: Family Medicine

## 2014-09-26 DIAGNOSIS — M25511 Pain in right shoulder: Secondary | ICD-10-CM | POA: Insufficient documentation

## 2014-09-26 NOTE — Assessment & Plan Note (Signed)
Tolerating statin, encouraged heart healthy diet, avoid trans fats, minimize simple carbs and saturated fats. Increase exercise as tolerated 

## 2014-09-26 NOTE — Assessment & Plan Note (Signed)
Mild, no acute injury, for a couple weeks. Encouraged moist heat, salon pas and stretch, report if worse

## 2014-09-26 NOTE — Assessment & Plan Note (Signed)
Well controlled, no changes to meds. Encouraged heart healthy diet such as the DASH diet and exercise as tolerated.  °

## 2014-09-26 NOTE — Progress Notes (Signed)
Walter Brown  [id number] March 11, 1941 09/26/2014      Progress Note-Follow Up  Subjective  Chief Complaint  Chief Complaint  Patient presents with  . Follow-up    4 mos    HPI  Patient is a 73 y.o. male in today for routine medical care. Patient is in today for follow-up. Overall feeling well. He is struggling with some mild achiness in his right shoulder but denies any injuries or falls. No radicular symptoms. Describes symptoms as mild. Denies CP/palp/SOB/HA/congestion/fevers/GI or GU c/o. Taking meds as prescribed  Past Medical History  Diagnosis Date  . BPH (benign prostatic hyperplasia)   . Chicken pox as child  . [place] measles as a child  . Cancer     skin- on nose- basal cell (20 yrs ago) forehead 1 year ago  . Thrombocytopenia   . Anemia     mild  . Asthma     childhood  . Hyperlipidemia   . Hypertension   . Preventative health care 08/01/2012  . Depression 08/01/2012  . Cerumen impaction 08/10/2012  . Otitis externa 08/10/2012  . Medicare annual wellness visit, subsequent 11/07/2013  . Right shoulder pain 09/26/2014    Past Surgical History  Procedure Laterality Date  . Hydrocele excision / repair  2012    b/l  . Skin cancer excision      nose and forehead  . Belpharoptosis repair      very young, b/l    Family History  Problem Relation Age of Onset  . Hypertension Mother   . Other Mother     stents  . Heart disease Mother   . Parkinsonism Father   . Heart disease Father   . Cancer Father 75    prostate  . Hypertension Father   . Cancer Maternal Grandfather     prostate ?  . ADD / ADHD Son     ADHD  . Other Son 24    part of 1 lung removed- due to infection  . Stroke Maternal Grandmother   . Cancer Paternal Grandfather     History   Social History  . Marital Status: Married    Spouse Name: N/A    Number of Children: N/A  . Years of Education: N/A   Occupational History  . Not on file.   Social History Main Topics  . Smoking  status: Former Smoker -- 1.50 packs/day for 15 years    Start date: 10/07/1978  . Smokeless tobacco: Never Used  . Alcohol Use: Yes     Comment: 2 glasses of wine daily  . Drug Use: No  . Sexual Activity: Yes   Other Topics Concern  . Not on file   Social History Narrative    Current Outpatient Prescriptions on File Prior to Visit  Medication Sig Dispense Refill  . aspirin 81 MG tablet Take 81 mg by mouth daily.    . fish oil-omega-3 fatty acids 1000 MG capsule Take 2 g by mouth daily.    . fluticasone (FLONASE) 50 MCG/ACT nasal spray Place 2 sprays into both nostrils daily. 16 g 2  . hydrochlorothiazide (HYDRODIURIL) 25 MG tablet TAKE 1 TABLET DAILY 90 tablet 1  . IRON COMBINATIONS PO Take by mouth.    . NON FORMULARY Occulite    . sertraline (ZOLOFT) 50 MG tablet Take 1 tablet (50 mg total) by mouth daily. 30 tablet 3  . simvastatin (ZOCOR) 20 MG tablet Take 1 tablet (20 mg total) by mouth every evening. (  Patient taking differently: Take 20 mg by mouth every evening. Take 1 table by mouth 3 times per week.) 90 tablet 1  . terazosin (HYTRIN) 5 MG capsule Take 5 mg by mouth daily.     No current facility-administered medications on file prior to visit.    No Known Allergies  Review of Systems  Review of Systems  Constitutional: Negative for fever and malaise/fatigue.  HENT: Negative for congestion.   Eyes: Negative for discharge.  Respiratory: Negative for shortness of breath.   Cardiovascular: Negative for chest pain, palpitations and leg swelling.  Gastrointestinal: Negative for nausea, abdominal pain and diarrhea.  Genitourinary: Negative for dysuria.  Musculoskeletal: Positive for joint pain. Negative for falls.  Skin: Negative for rash.  Neurological: Negative for loss of consciousness and headaches.  Endo/Heme/Allergies: Negative for polydipsia.  Psychiatric/Behavioral: Negative for depression and suicidal ideas. The patient is not nervous/anxious and does not have  insomnia.     Objective  BP 134/69 mmHg  Pulse 58  Temp(Src) 97.7 F (36.5 C) (Oral)  Wt 185 lb 9.6 oz (84.188 kg)  SpO2 98%  Physical Exam  Physical Exam  Constitutional: He is oriented to person, place, and time and well-developed, well-nourished, and in no distress. No distress.  HENT:  Head: Normocephalic and atraumatic.  Eyes: Conjunctivae are normal.  Neck: Neck supple. No thyromegaly present.  Cardiovascular: Normal rate, regular rhythm and normal heart sounds.   No murmur heard. Pulmonary/Chest: Effort normal and breath sounds normal. No respiratory distress.  Abdominal: He exhibits no distension and no mass. There is no tenderness.  Musculoskeletal: He exhibits no edema.  Neurological: He is alert and oriented to person, place, and time.  Skin: Skin is warm.  Psychiatric: Memory, affect and judgment normal.    Lab Results  Component Value Date   TSH 0.80 06/21/2014   Lab Results  Component Value Date   WBC 7.7 06/21/2014   HGB 14.1 06/21/2014   HCT 41.4 06/21/2014   MCV 93.1 06/21/2014   PLT 143.0* 06/21/2014   Lab Results  Component Value Date   CREATININE 1.2 06/21/2014   BUN 18 06/21/2014   NA 138 06/21/2014   K 3.9 06/21/2014   CL 103 06/21/2014   CO2 28 06/21/2014   Lab Results  Component Value Date   ALT 19 06/21/2014   AST 27 06/21/2014   ALKPHOS 53 06/21/2014   BILITOT 0.7 06/21/2014   Lab Results  Component Value Date   CHOL 148 06/21/2014   Lab Results  Component Value Date   HDL 66.60 06/21/2014   Lab Results  Component Value Date   LDLCALC 67 06/21/2014   Lab Results  Component Value Date   TRIG 74.0 06/21/2014   Lab Results  Component Value Date   CHOLHDL 2 06/21/2014     Assessment & Plan  Hypertension Well controlled, no changes to meds. Encouraged heart healthy diet such as the DASH diet and exercise as tolerated.   Overweight Encouraged DASH diet, decrease po intake and increase exercise as tolerated.  Needs 7-8 hours of sleep nightly. Avoid trans fats, eat small, frequent meals every 4-5 hours with lean proteins, complex carbs and healthy fats. Minimize simple carbs, GMO foods.  Right shoulder pain Mild, no acute injury, for a couple weeks. Encouraged moist heat, salon pas and stretch, report if worse  Hyperlipidemia Tolerating statin, encouraged heart healthy diet, avoid trans fats, minimize simple carbs and saturated fats. Increase exercise as tolerated

## 2014-09-26 NOTE — Assessment & Plan Note (Signed)
Encouraged DASH diet, decrease po intake and increase exercise as tolerated. Needs 7-8 hours of sleep nightly. Avoid trans fats, eat small, frequent meals every 4-5 hours with lean proteins, complex carbs and healthy fats. Minimize simple carbs, GMO foods. 

## 2014-11-12 ENCOUNTER — Other Ambulatory Visit: Payer: Self-pay | Admitting: Family Medicine

## 2014-11-14 NOTE — Telephone Encounter (Signed)
Last filled:  06/21/14 Amt: 30, 3 refills Last OV: 09/22/14  Med filled x 3 mths

## 2015-01-16 ENCOUNTER — Encounter: Payer: Self-pay | Admitting: Family Medicine

## 2015-01-17 ENCOUNTER — Other Ambulatory Visit: Payer: Self-pay | Admitting: Family Medicine

## 2015-01-17 MED ORDER — SERTRALINE HCL 50 MG PO TABS: 50.0000 mg | ORAL_TABLET | Freq: Every day | ORAL | Status: DC

## 2015-02-10 ENCOUNTER — Telehealth: Payer: Self-pay | Admitting: Family Medicine

## 2015-02-10 NOTE — Telephone Encounter (Signed)
Pre Visit letter sent  °

## 2015-03-02 ENCOUNTER — Telehealth: Payer: Self-pay | Admitting: *Deleted

## 2015-03-02 NOTE — Telephone Encounter (Signed)
Unable to reach patient at time of Pre-Visit Call.  Left message for patient to return call when available.    

## 2015-03-03 ENCOUNTER — Encounter: Payer: Self-pay | Admitting: Family Medicine

## 2015-03-03 ENCOUNTER — Ambulatory Visit (INDEPENDENT_AMBULATORY_CARE_PROVIDER_SITE_OTHER): Payer: Medicare HMO | Admitting: Family Medicine

## 2015-03-03 VITALS — BP 140/75 | HR 65 | Temp 97.8°F | Ht 65.0 in | Wt 179.0 lb

## 2015-03-03 DIAGNOSIS — E663 Overweight: Secondary | ICD-10-CM

## 2015-03-03 DIAGNOSIS — Z0000 Encounter for general adult medical examination without abnormal findings: Secondary | ICD-10-CM | POA: Diagnosis not present

## 2015-03-03 DIAGNOSIS — I10 Essential (primary) hypertension: Secondary | ICD-10-CM | POA: Diagnosis not present

## 2015-03-03 DIAGNOSIS — D649 Anemia, unspecified: Secondary | ICD-10-CM

## 2015-03-03 DIAGNOSIS — D696 Thrombocytopenia, unspecified: Secondary | ICD-10-CM | POA: Diagnosis not present

## 2015-03-03 DIAGNOSIS — H3530 Unspecified macular degeneration: Secondary | ICD-10-CM | POA: Insufficient documentation

## 2015-03-03 DIAGNOSIS — E785 Hyperlipidemia, unspecified: Secondary | ICD-10-CM

## 2015-03-03 DIAGNOSIS — F32A Depression, unspecified: Secondary | ICD-10-CM

## 2015-03-03 DIAGNOSIS — F329 Major depressive disorder, single episode, unspecified: Secondary | ICD-10-CM

## 2015-03-03 DIAGNOSIS — N400 Enlarged prostate without lower urinary tract symptoms: Secondary | ICD-10-CM

## 2015-03-03 LAB — COMPREHENSIVE METABOLIC PANEL
ALBUMIN: 4.4 g/dL (ref 3.5–5.2)
ALT: 19 U/L (ref 0–53)
AST: 27 U/L (ref 0–37)
Alkaline Phosphatase: 54 U/L (ref 39–117)
BUN: 18 mg/dL (ref 6–23)
CO2: 30 meq/L (ref 19–32)
Calcium: 9.4 mg/dL (ref 8.4–10.5)
Chloride: 105 mEq/L (ref 96–112)
Creatinine, Ser: 1.02 mg/dL (ref 0.40–1.50)
GFR: 75.9 mL/min (ref >60.00)
Glucose, Bld: 90 mg/dL (ref 70–99)
Potassium: 3.7 mEq/L (ref 3.5–5.1)
SODIUM: 140 meq/L (ref 135–145)
TOTAL PROTEIN: 7 g/dL (ref 6.0–8.3)
Total Bilirubin: 1.1 mg/dL (ref 0.2–1.2)

## 2015-03-03 LAB — LIPID PANEL
Cholesterol: 165 mg/dL (ref 0–200)
HDL: 66.1 mg/dL (ref >39.00)
LDL CALC: 83 mg/dL (ref 0–99)
NonHDL: 98.9
TRIGLYCERIDES: 82 mg/dL (ref 0.0–149.0)
Total CHOL/HDL Ratio: 2
VLDL: 16.4 mg/dL (ref 0.0–40.0)

## 2015-03-03 LAB — CBC
HCT: 44.4 % (ref 39.0–52.0)
HEMOGLOBIN: 15.1 g/dL (ref 13.0–17.0)
MCHC: 33.9 g/dL (ref 30.0–36.0)
MCV: 92.2 fl (ref 78.0–100.0)
PLATELETS: 157 10*3/uL (ref 150.0–400.0)
RBC: 4.82 Mil/uL (ref 4.22–5.81)
RDW: 13.8 % (ref 11.5–15.5)
WBC: 7.9 10*3/uL (ref 4.0–10.5)

## 2015-03-03 LAB — TSH: TSH: 1.08 u[IU]/mL (ref 0.35–4.50)

## 2015-03-03 MED ORDER — SERTRALINE HCL 50 MG PO TABS: 50.0000 mg | ORAL_TABLET | Freq: Every day | ORAL | Status: DC

## 2015-03-03 NOTE — Progress Notes (Signed)
Pre visit review using our clinic review tool, if applicable. No additional management support is needed unless otherwise documented below in the visit note. 

## 2015-03-03 NOTE — Patient Instructions (Signed)
[facility], notes and colonoscopy reports, pathology  Preventive Care for Adults A healthy lifestyle and preventive care can promote health and wellness. Preventive health guidelines for men include the following key practices:  A routine yearly physical is a good way to check with your health care provider about your health and preventative screening. It is a chance to share any concerns and updates on your health and to receive a thorough exam.  Visit your dentist for a routine exam and preventative care every 6 months. Brush your teeth twice a day and floss once a day. Good oral hygiene prevents tooth decay and gum disease.  The frequency of eye exams is based on your age, health, family medical history, use of contact lenses, and other factors. Follow your health care provider's recommendations for frequency of eye exams.  Eat a healthy diet. Foods such as vegetables, fruits, whole grains, low-fat dairy   products, and lean protein foods contain the nutrients you need without too many calories. Decrease your intake of foods high in solid fats, added sugars, and salt. Eat the right amount of calories for you.Get information about a proper diet from your health care provider, if necessary.  Regular physical exercise is one of the most important things you can do for your health. Most adults should get at least 150 minutes of moderate-intensity exercise (any activity that increases your heart rate and causes you to sweat) each week. In addition, most adults need muscle-strengthening exercises on 2 or more days a week.  Maintain a healthy weight. The body mass index (BMI) is a screening tool to identify possible weight problems. It provides an estimate of body fat based on height and weight. Your health care provider can find your BMI and can help you achieve or maintain a healthy weight.For adults 20 years and older:  A BMI below 18.5 is considered underweight.  A  BMI of 18.5 to 24.9 is normal.  A BMI of 25 to 29.9 is considered overweight.  A BMI of 30 and above is considered obese.  Maintain normal blood lipids and cholesterol levels by exercising and minimizing your intake of saturated fat. Eat a balanced diet with plenty of fruit and vegetables. Blood tests for lipids and cholesterol should begin at age 15 and be repeated every 5 years. If your lipid or cholesterol levels are high, you are over 50, or you are at high risk for heart disease, you may need your cholesterol levels checked more frequently.Ongoing high lipid and cholesterol levels should be treated with medicines if diet and exercise are not working.  If you smoke, find out from your health care provider how to quit. If you do not use tobacco, do not start.  Lung cancer screening is recommended for adults aged 63-80 years who are at high risk for developing lung cancer because of a history of smoking. A yearly low-dose CT scan of the lungs is recommended for people who have at least a 30-pack-year history of smoking and are a current smoker or have quit within the past 15 years. A pack year of smoking is smoking an average of 1 pack of cigarettes a day for 1 year (for example: 1 pack a day for 30 years or 2 packs a day for 15 years). Yearly screening should continue until the smoker has stopped smoking for at least 15 years. Yearly screening should be stopped for people who develop a health problem that would prevent them from having lung cancer  treatment.  If you choose to drink alcohol, do not have more than 2 drinks per day. One drink is considered to be 12 ounces (355 mL) of beer, 5 ounces (148 mL) of wine, or 1.5 ounces (44 mL) of liquor.  Avoid use of street drugs. Do not share needles with anyone. Ask for help if you need support or instructions about stopping the use of drugs.  High blood pressure causes heart disease and increases the risk of stroke. Your blood pressure should be  checked at least every 1-2 years. Ongoing high blood pressure should be treated with medicines, if weight loss and exercise are not effective.  If you are 51-34 years old, ask your health care provider if you should take aspirin to prevent heart disease.  Diabetes screening involves taking a blood sample to check your fasting blood sugar level. This should be done once every 3 years, after age 78, if you are within normal weight and without risk factors for diabetes. Testing should be considered at a younger age or be carried out more frequently if you are overweight and have at least 1 risk factor for diabetes.  Colorectal cancer can be detected and often prevented. Most routine colorectal cancer screening begins at the age of 32 and continues through age 67. However, your health care provider may recommend screening at an earlier age if you have risk factors for colon cancer. On a yearly basis, your health care provider may provide home test kits to check for hidden blood in the stool. Use of a small camera at the end of a tube to directly examine the colon (sigmoidoscopy or colonoscopy) can detect the earliest forms of colorectal cancer. Talk to your health care provider about this at age 74, when routine screening begins. Direct exam of the colon should be repeated every 5-10 years through age 108, unless early forms of precancerous polyps or small growths are found.  People who are at an increased risk for hepatitis B should be screened for this virus. You are considered at high risk for hepatitis B if:  You were born in a country where hepatitis B occurs often. Talk with your health care provider about which countries are considered high risk.  Your parents were born in a high-risk country and you have not received a shot to protect against hepatitis B (hepatitis B vaccine).  You have HIV or AIDS.  You use needles to inject street drugs.  You live with, or have sex with, someone who has  hepatitis B.  You are a man who has sex with other men (MSM).  You get hemodialysis treatment.  You take certain medicines for conditions such as cancer, organ transplantation, and autoimmune conditions.  Hepatitis C blood testing is recommended for all people born from [birth year] through 1965 and any individual with known risks for hepatitis C.  Practice safe sex. Use condoms and avoid high-risk sexual practices to reduce the spread of sexually transmitted infections (STIs). STIs include gonorrhea, chlamydia, syphilis, trichomonas, herpes, HPV, and human immunodeficiency virus (HIV). Herpes, HIV, and HPV are viral illnesses that have no cure. They can result in disability, cancer, and death.  If you are at risk of being infected with HIV, it is recommended that you take a prescription medicine daily to prevent HIV infection. This is called preexposure prophylaxis (PrEP). You are considered at risk if:  You are a man who has sex with other men (MSM) and have other risk factors.  You are a  heterosexual man, are sexually active, and are at increased risk for HIV infection.  You take drugs by injection.  You are sexually active with a partner who has HIV.  Talk with your health care provider about whether you are at high risk of being infected with HIV. If you choose to begin PrEP, you should first be tested for HIV. You should then be tested every 3 months for as long as you are taking PrEP.  A one-time screening for abdominal aortic aneurysm (AAA) and surgical repair of large AAAs by ultrasound are recommended for men ages 40 to 32 years who are current or former smokers.  Healthy men should no longer receive prostate-specific antigen (PSA) blood tests as part of routine cancer screening. Talk with your health care provider about prostate cancer screening.  Testicular cancer screening is not recommended for adult males who have no symptoms. Screening includes self-exam, a health care provider  exam, and other screening tests. Consult with your health care provider about any symptoms you have or any concerns you have about testicular cancer.  Use sunscreen. Apply sunscreen liberally and repeatedly throughout the day. You should seek shade when your shadow is shorter than you. Protect yourself by wearing long sleeves, pants, a wide-brimmed hat, and sunglasses year round, whenever you are outdoors.  Once a month, do a whole-body skin exam, using a mirror to look at the skin on your back. Tell your health care provider about new moles, moles that have irregular borders, moles that are larger than a pencil eraser, or moles that have changed in shape or color.  Stay current with required vaccines (immunizations).  Influenza vaccine. All adults should be immunized every year.  Tetanus, diphtheria, and acellular pertussis (Td, Tdap) vaccine. An adult who has not previously received Tdap or who does not know his vaccine status should receive 1 dose of Tdap. This initial dose should be followed by tetanus and diphtheria toxoids (Td) booster doses every 10 years. Adults with an unknown or incomplete history of completing a 3-dose immunization series with Td-containing vaccines should begin or complete a primary immunization series including a Tdap dose. Adults should receive a Td booster every 10 years.  Varicella vaccine. An adult without evidence of immunity to varicella should receive 2 doses or a second dose if he has previously received 1 dose.  Human papillomavirus (HPV) vaccine. Males aged 78-21 years who have not received the vaccine previously should receive the 3-dose series. Males aged 22-26 years may be immunized. Immunization is recommended through the age of 60 years for any male who has sex with males and did not get any or all doses earlier. Immunization is recommended for any person with an immunocompromised condition through the age of 80 years if he did not get any or all doses  earlier. During the 3-dose series, the second dose should be obtained 4-8 weeks after the first dose. The third dose should be obtained 24 weeks after the first dose and 16 weeks after the second dose.  Zoster vaccine. One dose is recommended for adults aged 20 years or older unless certain conditions are present.  Measles, mumps, and rubella (MMR) vaccine. Adults born before [birth year] generally are considered immune to measles and mumps. Adults born in [birth year] or later should have 1 or more doses of MMR vaccine unless there is a contraindication to the vaccine or there is laboratory evidence of immunity to each of the three diseases. A routine second dose of MMR vaccine should  be obtained at least 28 days after the first dose for students attending postsecondary schools, health care workers, or international travelers. People who received inactivated measles vaccine or an unknown type of measles vaccine during 1963-1967 should receive 2 doses of MMR vaccine. People who received inactivated mumps vaccine or an unknown type of mumps vaccine before 1979 and are at high risk for mumps infection should consider immunization with 2 doses of MMR vaccine. Unvaccinated health care workers born before [birth year] who lack laboratory evidence of measles, mumps, or rubella immunity or laboratory confirmation of disease should consider measles and mumps immunization with 2 doses of MMR vaccine or rubella immunization with 1 dose of MMR vaccine.  Pneumococcal 13-valent conjugate (PCV13) vaccine. When indicated, a person who is uncertain of his immunization history and has no record of immunization should receive the PCV13 vaccine. An adult aged 63 years or older who has certain medical conditions and has not been previously immunized should receive 1 dose of PCV13 vaccine. This PCV13 should be followed with a dose of pneumococcal polysaccharide (PPSV23) vaccine. The PPSV23 vaccine dose should be obtained at least 8 weeks after the dose  of PCV13 vaccine. An adult aged 100 years or older who has certain medical conditions and previously received 1 or more doses of PPSV23 vaccine should receive 1 dose of PCV13. The PCV13 vaccine dose should be obtained 1 or more years after the last PPSV23 vaccine dose.  Pneumococcal polysaccharide (PPSV23) vaccine. When PCV13 is also indicated, PCV13 should be obtained first. All adults aged 48 years and older should be immunized. An adult younger than age 37 years who has certain medical conditions should be immunized. Any person who resides in a nursing home or long-term care facility should be immunized. An adult smoker should be immunized. People with an immunocompromised condition and certain other conditions should receive both PCV13 and PPSV23 vaccines. People with human immunodeficiency virus (HIV) infection should be immunized as soon as possible after diagnosis. Immunization during chemotherapy or radiation therapy should be avoided. Routine use of PPSV23 vaccine is not recommended for American Indians, [place] Natives, or people younger than 65 years unless there are medical conditions that require PPSV23 vaccine. When indicated, people who have unknown immunization and have no record of immunization should receive PPSV23 vaccine. One-time revaccination 5 years after the first dose of PPSV23 is recommended for people aged 19-64 years who have chronic kidney failure, nephrotic syndrome, asplenia, or immunocompromised conditions. People who received 1-2 doses of PPSV23 before age 44 years should receive another dose of PPSV23 vaccine at age 15 years or later if at least 5 years have passed since the previous dose. Doses of PPSV23 are not needed for people immunized with PPSV23 at or after age 47 years.  Meningococcal vaccine. Adults with asplenia or persistent complement component deficiencies should receive 2 doses of quadrivalent meningococcal conjugate (MenACWY-D) vaccine. The doses should be obtained  at least 2 months apart. Microbiologists working with certain meningococcal bacteria, [place] recruits, people at risk during an outbreak, and people who travel to or live in countries with a high rate of meningitis should be immunized. A first-year college student up through age 54 years who is living in a residence hall should receive a dose if he did not receive a dose on or after his 16th birthday. Adults who have certain high-risk conditions should receive one or more doses of vaccine.  Hepatitis A vaccine. Adults who wish to be protected from this disease, have certain  high-risk conditions, work with hepatitis A-infected animals, work in hepatitis A research labs, or travel to or work in countries with a high rate of hepatitis A should be immunized. Adults who were previously unvaccinated and who anticipate close contact with an international adoptee during the first 60 days after arrival in the [place] from a country with a high rate of hepatitis A should be immunized.  Hepatitis B vaccine. Adults should be immunized if they wish to be protected from this disease, have certain high-risk conditions, may be exposed to blood or other infectious body fluids, are household contacts or sex partners of hepatitis B positive people, are clients or workers in certain care facilities, or travel to or work in countries with a high rate of hepatitis B.  Haemophilus influenzae type b (Hib) vaccine. A previously unvaccinated person with asplenia or sickle cell disease or having a scheduled splenectomy should receive 1 dose of Hib vaccine. Regardless of previous immunization, a recipient of a hematopoietic stem cell transplant should receive a 3-dose series 6-12 months after his successful transplant. Hib vaccine is not recommended for adults with HIV infection. Preventive Service / Frequency Ages 7 to 81  Blood pressure check.** / Every 1 to 2 years.  Lipid and cholesterol check.** / Every 5 years  beginning at age 59.  Hepatitis C blood test.** / For any individual with known risks for hepatitis C.  Skin self-exam. / Monthly.  Influenza vaccine. / Every year.  Tetanus, diphtheria, and acellular pertussis (Tdap, Td) vaccine.** / Consult your health care provider. 1 dose of Td every 10 years.  Varicella vaccine.** / Consult your health care provider.  HPV vaccine. / 3 doses over 6 months, if 46 or younger.  Measles, mumps, rubella (MMR) vaccine.** / You need at least 1 dose of MMR if you were born in 1957 or later. You may also need a second dose.  Pneumococcal 13-valent conjugate (PCV13) vaccine.** / Consult your health care provider.  Pneumococcal polysaccharide (PPSV23) vaccine.** / 1 to 2 doses if you smoke cigarettes or if you have certain conditions.  Meningococcal vaccine.** / 1 dose if you are age 63 to 56 years and a Market researcher living in a residence hall, or have one of several medical conditions. You may also need additional booster doses.  Hepatitis A vaccine.** / Consult your health care provider.  Hepatitis B vaccine.** / Consult your health care provider.  Haemophilus influenzae type b (Hib) vaccine.** / Consult your health care provider. Ages 24 to 104  Blood pressure check.** / Every 1 to 2 years.  Lipid and cholesterol check.** / Every 5 years beginning at age 37.  Lung cancer screening. / Every year if you are aged 61-80 years and have a 30-pack-year history of smoking and currently smoke or have quit within the past 15 years. Yearly screening is stopped once you have quit smoking for at least 15 years or develop a health problem that would prevent you from having lung cancer treatment.  Fecal occult blood test (FOBT) of stool. / Every year beginning at age 58 and continuing until age 76. You may not have to do this test if you get a colonoscopy every 10 years.  Flexible sigmoidoscopy** or colonoscopy.** / Every 5 years for a flexible  sigmoidoscopy or every 10 years for a colonoscopy beginning at age 17 and continuing until age 19.  Hepatitis C blood test.** / For all people born from [birth year] through 1965 and any individual with known  risks for hepatitis C.  Skin self-exam. / Monthly.  Influenza vaccine. / Every year.  Tetanus, diphtheria, and acellular pertussis (Tdap/Td) vaccine.** / Consult your health care provider. 1 dose of Td every 10 years.  Varicella vaccine.** / Consult your health care provider.  Zoster vaccine.** / 1 dose for adults aged 78 years or older.  Measles, mumps, rubella (MMR) vaccine.** / You need at least 1 dose of MMR if you were born in 1957 or later. You may also need a second dose.  Pneumococcal 13-valent conjugate (PCV13) vaccine.** / Consult your health care provider.  Pneumococcal polysaccharide (PPSV23) vaccine.** / 1 to 2 doses if you smoke cigarettes or if you have certain conditions.  Meningococcal vaccine.** / Consult your health care provider.  Hepatitis A vaccine.** / Consult your health care provider.  Hepatitis B vaccine.** / Consult your health care provider.  Haemophilus influenzae type b (Hib) vaccine.** / Consult your health care provider. Ages 78 and over  Blood pressure check.** / Every 1 to 2 years.  Lipid and cholesterol check.**/ Every 5 years beginning at age 64.  Lung cancer screening. / Every year if you are aged 39-80 years and have a 30-pack-year history of smoking and currently smoke or have quit within the past 15 years. Yearly screening is stopped once you have quit smoking for at least 15 years or develop a health problem that would prevent you from having lung cancer treatment.  Fecal occult blood test (FOBT) of stool. / Every year beginning at age 79 and continuing until age 71. You may not have to do this test if you get a colonoscopy every 10 years.  Flexible sigmoidoscopy** or colonoscopy.** / Every 5 years for a flexible sigmoidoscopy or every 10  years for a colonoscopy beginning at age 78 and continuing until age 67.  Hepatitis C blood test.** / For all people born from [birth year] through 1965 and any individual with known risks for hepatitis C.  Abdominal aortic aneurysm (AAA) screening.** / A one-time screening for ages 72 to 32 years who are current or former smokers.  Skin self-exam. / Monthly.  Influenza vaccine. / Every year.  Tetanus, diphtheria, and acellular pertussis (Tdap/Td) vaccine.** / 1 dose of Td every 10 years.  Varicella vaccine.** / Consult your health care provider.  Zoster vaccine.** / 1 dose for adults aged 60 years or older.  Pneumococcal 13-valent conjugate (PCV13) vaccine.** / Consult your health care provider.  Pneumococcal polysaccharide (PPSV23) vaccine.** / 1 dose for all adults aged 29 years and older.  Meningococcal vaccine.** / Consult your health care provider.  Hepatitis A vaccine.** / Consult your health care provider.  Hepatitis B vaccine.** / Consult your health care provider.  Haemophilus influenzae type b (Hib) vaccine.** / Consult your health care provider. **Family history and personal history of risk and conditions may change your health care provider's recommendations. Document Released: 11/19/2001 Document Revised: 09/28/2013 Document Reviewed: 02/18/2011 [facility] Patient Information 2015 [place], [place]. This information is not intended to replace advice given to you by your health care provider. Make sure you discuss any questions you have with your health care provider.

## 2015-03-12 ENCOUNTER — Encounter: Payer: Self-pay | Admitting: Family Medicine

## 2015-03-12 NOTE — Assessment & Plan Note (Signed)
Patient following with Dr Janice Norrie, is doing well. Last PSA was 3.5.

## 2015-03-12 NOTE — Assessment & Plan Note (Signed)
Encouraged increased hydration, 64 ounces of clear fluids daily. Minimize alcohol and caffeine. Eat small frequent meals with lean proteins and complex carbs. Avoid high and low blood sugars. Get adequate sleep, 7-8 hours a night. Needs exercise daily preferably in the morning.  

## 2015-03-12 NOTE — Assessment & Plan Note (Signed)
Dr Janice Norrie at [facility]: Dr's [place] and Annitta Needs with opthamology, does not remember the name [facility] Patient denies any difficulties at home. No trouble with ADLs, depression or falls. No recent changes to vision or hearing. Is UTD with immunizations. Is UTD with screening. Discussed Advanced Directives, patient agrees to bring [place] copies of documents if can. Encouraged heart healthy diet, exercise as tolerated and adequate sleep  See problem list for risk factors See AVS for preventative healthcare schedule Doing well with functional activities at home.

## 2015-03-12 NOTE — Assessment & Plan Note (Signed)
Tolerating statin, encouraged heart healthy diet, avoid trans fats, minimize simple carbs and saturated fats. Increase exercise as tolerated 

## 2015-03-12 NOTE — Progress Notes (Signed)
Walter Brown  [id number] 1941/01/16 03/12/2015      Progress Note-Follow Up  Subjective  Chief Complaint  Chief Complaint  Patient presents with  . Follow-up    HPI  Patient is a 74 y.o. male in today for routine medical care.  He is in for annual exam. He is doing fairly well. No recent illness. He does note some hearing loss over several years. No tinnitus. Follows with opthamology for macular degeneration but no recent vision loss.  Is doing well in his home, able to perform daily care and home care. Is eating heart healthy diet and regular activity. Denies CP/palp/SOB/HA/congestion/fevers/GI or GU c/o. Taking meds as prescribed Past Medical History  Diagnosis Date  . BPH (benign prostatic hyperplasia)   . Chicken pox as child  . [place] measles as a child  . Cancer     skin- on nose- basal cell (20 yrs ago) forehead 1 year ago  . Thrombocytopenia   . Anemia     mild  . Asthma     childhood  . Hyperlipidemia   . Hypertension   . Preventative health care 08/01/2012  . Depression 08/01/2012  . Cerumen impaction 08/10/2012  . Otitis externa 08/10/2012  . Medicare annual wellness visit, subsequent 11/07/2013  . Right shoulder pain 09/26/2014  . Macular degeneration     Past Surgical History  Procedure Laterality Date  . Hydrocele excision / repair  2012    b/l  . Skin cancer excision      nose and forehead  . Belpharoptosis repair      very young, b/l    Family History  Problem Relation Age of Onset  . Hypertension Mother   . Other Mother     stents  . Heart disease Mother   . Parkinsonism Father   . Heart disease Father   . Cancer Father 55    prostate  . Hypertension Father   . Cancer Maternal Grandfather     prostate ?  . ADD / ADHD Son     ADHD  . Other Son 24    part of 1 lung removed- due to infection  . Stroke Maternal Grandmother   . Cancer Paternal Grandfather     History   Social History  . Marital Status: Married    Spouse Name: N/A    . Number of Children: N/A  . Years of Education: N/A   Occupational History  . Not on file.   Social History Main Topics  . Smoking status: Former Smoker -- 1.50 packs/day for 15 years    Start date: 10/07/1978  . Smokeless tobacco: Never Used  . Alcohol Use: Yes     Comment: 2 glasses of wine daily  . Drug Use: No  . 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   Other Topics Concern  . Not on file   Social History Narrative    Current Outpatient Prescriptions on File Prior to Visit  Medication Sig Dispense Refill  . aspirin 81 MG tablet Take 81 mg by mouth daily.    . fluticasone (FLONASE) 50 MCG/ACT nasal spray Place 2 sprays into both nostrils daily. 16 g 2  . hydrochlorothiazide (HYDRODIURIL) 25 MG tablet TAKE 1 TABLET DAILY 90 tablet 1  . NON FORMULARY Occulite    . simvastatin (ZOCOR) 20 MG tablet Take 1 tablet (20 mg total) by mouth every evening. (  Patient taking differently: Take 20 mg by mouth every evening. Take 1 table by mouth 3 times per week.) 90 tablet 1  . terazosin (HYTRIN) 5 MG capsule Take 5 mg by mouth daily.     No current facility-administered medications on file prior to visit.    No Known Allergies  Review of Systems  Review of Systems  Constitutional: Negative for fever, chills and malaise/fatigue.  HENT: Negative for congestion, hearing loss and nosebleeds.   Eyes: Negative for discharge.  Respiratory: Negative for cough, sputum production, shortness of breath and wheezing.   Cardiovascular: Negative for chest pain, palpitations and leg swelling.  Gastrointestinal: Negative for heartburn, nausea, vomiting, abdominal pain, diarrhea, constipation and blood in stool.  Genitourinary: Negative for dysuria, urgency, frequency and hematuria.  Musculoskeletal: Negative for myalgias, back pain and falls.  Skin: Negative for rash.  Neurological: Negative for  dizziness, tremors, sensory change, focal weakness, loss of consciousness, weakness and headaches.  Endo/Heme/Allergies: Negative for polydipsia. Does not bruise/bleed easily.  Psychiatric/Behavioral: Negative for depression and suicidal ideas. The patient is not nervous/anxious and does not have insomnia.     Objective  BP 140/75 mmHg  Pulse 65  Temp(Src) 97.8 F (36.6 C) (Oral)  Ht 5\' 5"  (1.651 m)  Wt 179 lb (81.194 kg)  BMI 29.79 kg/m2  SpO2 98%  Physical Exam  Physical Exam  Constitutional: He is oriented to person, place, and time and well-developed, well-nourished, and in no distress. No distress.  HENT:  Head: Normocephalic and atraumatic.  Eyes: Conjunctivae are normal.  Neck: Neck supple. No thyromegaly present.  Cardiovascular: Normal rate, regular rhythm and normal heart sounds.   No murmur heard. Pulmonary/Chest: Effort normal and breath sounds normal. No respiratory distress.  Abdominal: He exhibits no distension and no mass. There is no tenderness.  Musculoskeletal: He exhibits no edema.  Neurological: He is alert and oriented to person, place, and time.  Skin: Skin is warm.  Psychiatric: Memory, affect and judgment normal.    Lab Results  Component Value Date   TSH 1.08 03/03/2015   Lab Results  Component Value Date   WBC 7.9 03/03/2015   HGB 15.1 03/03/2015   HCT 44.4 03/03/2015   MCV 92.2 03/03/2015   PLT 157.0 03/03/2015   Lab Results  Component Value Date   CREATININE 1.02 03/03/2015   BUN 18 03/03/2015   NA 140 03/03/2015   K 3.7 03/03/2015   CL 105 03/03/2015   CO2 30 03/03/2015   Lab Results  Component Value Date   ALT 19 03/03/2015   AST 27 03/03/2015   ALKPHOS 54 03/03/2015   BILITOT 1.1 03/03/2015   Lab Results  Component Value Date   CHOL 165 03/03/2015   Lab Results  Component Value Date   HDL 66.10 03/03/2015   Lab Results  Component Value Date   LDLCALC 83 03/03/2015   Lab Results  Component Value Date   TRIG  82.0 03/03/2015   Lab Results  Component Value Date   CHOLHDL 2 03/03/2015     Assessment & Plan  Hypertension Encouraged increased hydration, 64 ounces of clear fluids daily. Minimize alcohol and caffeine. Eat small frequent meals with lean proteins and complex carbs. Avoid high and low blood sugars. Get adequate sleep, 7-8 hours a night. Needs exercise daily preferably in the morning.   BPH (benign prostatic hyperplasia) Patient following with Dr Janice Norrie, is doing well. Last PSA was 3.5.   Hyperlipidemia Tolerating statin, encouraged heart healthy diet, avoid trans  fats, minimize simple carbs and saturated fats. Increase exercise as tolerated   Overweight Encouraged DASH diet, decrease po intake and increase exercise as tolerated. Needs 7-8 hours of sleep nightly. Avoid trans fats, eat small, frequent meals every 4-5 hours with lean proteins, complex carbs and healthy fats. Minimize simple carbs, GMO foods.   Medicare annual wellness visit, subsequent Dr Janice Norrie at [facility]: Dr's [place] and [place] Needs with opthamology, does not remember the name [place] [facility] Patient denies any difficulties at home. No trouble with ADLs, depression or falls. No recent changes to vision or hearing. Is UTD with immunizations. Is UTD with screening. Discussed Advanced Directives, patient agrees to bring [place] copies of documents if can. Encouraged heart healthy diet, exercise as tolerated and adequate sleep  See problem list for risk factors See AVS for preventative healthcare schedule Doing well with functional activities at home.

## 2015-03-12 NOTE — Assessment & Plan Note (Signed)
Patient encouraged to maintain heart healthy diet, regular exercise, adequate sleep. Consider daily probiotics. Take medications as prescribed. Immunizations up to date. Will continue to follow with urology and dermatology.

## 2015-03-12 NOTE — Assessment & Plan Note (Signed)
Doing well on Sertraline, continue

## 2015-03-12 NOTE — Assessment & Plan Note (Signed)
Encouraged DASH diet, decrease po intake and increase exercise as tolerated. Needs 7-8 hours of sleep nightly. Avoid trans fats, eat small, frequent meals every 4-5 hours with lean proteins, complex carbs and healthy fats. Minimize simple carbs, GMO foods. 

## 2015-04-13 ENCOUNTER — Encounter: Payer: Self-pay | Admitting: Family Medicine

## 2015-05-01 ENCOUNTER — Encounter: Payer: Self-pay | Admitting: Family Medicine

## 2015-05-01 NOTE — Telephone Encounter (Signed)
Spoke with Dr. Charlett Blake and she states can cancel whatever is needed.

## 2015-05-25 ENCOUNTER — Encounter: Payer: Self-pay | Admitting: Family Medicine

## 2015-05-25 ENCOUNTER — Other Ambulatory Visit: Payer: Self-pay | Admitting: Family Medicine

## 2015-05-25 ENCOUNTER — Telehealth: Payer: Self-pay | Admitting: Family Medicine

## 2015-05-25 MED ORDER — SIMVASTATIN 20 MG PO TABS: 20.0000 mg | ORAL_TABLET | Freq: Every evening | ORAL | Status: DC

## 2015-05-25 MED ORDER — HYDROCHLOROTHIAZIDE 25 MG PO TABS
25.0000 mg | ORAL_TABLET | Freq: Every day | ORAL | Status: DC
Start: 2015-05-25 — End: 2015-11-27

## 2015-05-25 NOTE — Telephone Encounter (Signed)
Sent in both prescriptions to [place] and called the pt. Left msg. Scripts sent in as requested.

## 2015-05-25 NOTE — Telephone Encounter (Signed)
Caller name: Shahir Karen  Relationship to patient: Self  Can be reached: [phone]  Pharmacy:  Reason for call: pt is requesting a refill on 2 medications. 1. Simvastatin  2. Hydrochlorothiazide- Pt would like to change is Pharmacy on this Rx to [facility] on [address].

## 2015-07-10 ENCOUNTER — Encounter: Payer: Self-pay | Admitting: Family Medicine

## 2015-07-11 DIAGNOSIS — Z85828 Personal history of other malignant neoplasm of skin: Secondary | ICD-10-CM | POA: Diagnosis not present

## 2015-07-11 DIAGNOSIS — Z86018 Personal history of other benign neoplasm: Secondary | ICD-10-CM | POA: Diagnosis not present

## 2015-07-11 DIAGNOSIS — D1800 Hemangioma unspecified site: Secondary | ICD-10-CM | POA: Diagnosis not present

## 2015-07-11 DIAGNOSIS — Z23 Encounter for immunization: Secondary | ICD-10-CM | POA: Diagnosis not present

## 2015-07-11 DIAGNOSIS — L814 Other melanin hyperpigmentation: Secondary | ICD-10-CM | POA: Diagnosis not present

## 2015-07-11 DIAGNOSIS — D225 Melanocytic nevi of trunk: Secondary | ICD-10-CM | POA: Diagnosis not present

## 2015-07-11 DIAGNOSIS — L570 Actinic keratosis: Secondary | ICD-10-CM | POA: Diagnosis not present

## 2015-07-11 DIAGNOSIS — L821 Other seborrheic keratosis: Secondary | ICD-10-CM | POA: Diagnosis not present

## 2015-08-17 ENCOUNTER — Other Ambulatory Visit: Payer: Self-pay | Admitting: Family Medicine

## 2015-08-17 MED ORDER — SERTRALINE HCL 50 MG PO TABS: 50.0000 mg | ORAL_TABLET | Freq: Every day | ORAL | Status: DC

## 2015-09-04 ENCOUNTER — Encounter: Payer: Self-pay | Admitting: Family Medicine

## 2015-09-04 ENCOUNTER — Ambulatory Visit (INDEPENDENT_AMBULATORY_CARE_PROVIDER_SITE_OTHER): Payer: Medicare HMO | Admitting: Family Medicine

## 2015-09-04 VITALS — BP 128/80 | HR 75 | Temp 97.9°F | Ht 65.0 in | Wt 186.1 lb

## 2015-09-04 DIAGNOSIS — D696 Thrombocytopenia, unspecified: Secondary | ICD-10-CM

## 2015-09-04 DIAGNOSIS — I10 Essential (primary) hypertension: Secondary | ICD-10-CM | POA: Diagnosis not present

## 2015-09-04 DIAGNOSIS — E785 Hyperlipidemia, unspecified: Secondary | ICD-10-CM | POA: Diagnosis not present

## 2015-09-04 DIAGNOSIS — E663 Overweight: Secondary | ICD-10-CM

## 2015-09-04 DIAGNOSIS — E782 Mixed hyperlipidemia: Secondary | ICD-10-CM

## 2015-09-04 LAB — COMPREHENSIVE METABOLIC PANEL
ALBUMIN: 4 g/dL (ref 3.5–5.2)
ALT: 17 U/L (ref 0–53)
AST: 19 U/L (ref 0–37)
Alkaline Phosphatase: 52 U/L (ref 39–117)
BUN: 18 mg/dL (ref 6–23)
CHLORIDE: 105 meq/L (ref 96–112)
CO2: 29 mEq/L (ref 19–32)
CREATININE: 1.03 mg/dL (ref 0.40–1.50)
Calcium: 9.4 mg/dL (ref 8.4–10.5)
GFR: 74.95 mL/min (ref >60.00)
GLUCOSE: 98 mg/dL (ref 70–99)
Potassium: 3.8 mEq/L (ref 3.5–5.1)
SODIUM: 140 meq/L (ref 135–145)
Total Bilirubin: 0.7 mg/dL (ref 0.2–1.2)
Total Protein: 6.5 g/dL (ref 6.0–8.3)

## 2015-09-04 LAB — TSH: TSH: 1.5 u[IU]/mL (ref 0.35–4.50)

## 2015-09-04 LAB — LIPID PANEL
CHOLESTEROL: 157 mg/dL (ref 0–200)
HDL: 62 mg/dL (ref >39.00)
LDL CALC: 69 mg/dL (ref 0–99)
NonHDL: 94.52
Total CHOL/HDL Ratio: 3
Triglycerides: 128 mg/dL (ref 0.0–149.0)
VLDL: 25.6 mg/dL (ref 0.0–40.0)

## 2015-09-04 LAB — CBC
HEMATOCRIT: 43.6 % (ref 39.0–52.0)
Hemoglobin: 14.4 g/dL (ref 13.0–17.0)
MCHC: 33.1 g/dL (ref 30.0–36.0)
MCV: 93.1 fl (ref 78.0–100.0)
Platelets: 149 10*3/uL — ABNORMAL LOW (ref 150.0–400.0)
RBC: 4.69 Mil/uL (ref 4.22–5.81)
RDW: 13.6 % (ref 11.5–15.5)
WBC: 7.1 10*3/uL (ref 4.0–10.5)

## 2015-09-04 NOTE — Progress Notes (Signed)
INDIA GIRGIS TO:[id number] 1941/02/05 09/04/2015      Patient Progress Note   Subjective  Chief Complaint  Chief Complaint  Patient presents with  . Follow-up    HPI  74 year old male presents for 6 month follow up. Overall he is doing well but wakes up around 2-3am and cannot fall back asleep. He has taken advil pm which seems to help some. Melatonin does not help and he has these episodes nightly. He does note some hearing loss over several years that has not changed much recently. Tinnitus in left ear once a day. Follows with opthamology for macular degeneration but no recent vision loss. Is doing well in his home, able to perform daily care and home care. Aware he has gained weight recently, but exercises 30 min 4-5x a week. Is eating more than he should and thinks it is mostly from his large dinner portions. Would like to start eating less at dinner and drink only one glass of wine. Patient denies shortness of breath, chest pain,changes in urination, GI issues, recent fevers or illnesses    Past Medical History  Diagnosis Date  . BPH (benign prostatic hyperplasia)   . Chicken pox as child  . [place] measles as a child  . Cancer ([place])     skin- on nose- basal cell (20 yrs ago) forehead 1 year ago  . Thrombocytopenia ([place])   . Anemia     mild  . Asthma     childhood  . Hyperlipidemia   . Hypertension   . Preventative health care 08/01/2012  . Depression 08/01/2012  . Cerumen impaction 08/10/2012  . Otitis externa 08/10/2012  . Medicare annual wellness visit, subsequent 11/07/2013  . Right shoulder pain 09/26/2014  . Macular degeneration     Past Surgical History  Procedure Laterality Date  . Hydrocele excision / repair  2012    b/l  . Skin cancer excision      nose and forehead  . Belpharoptosis repair      very young, b/l    Family History  Problem Relation Age of Onset  . Hypertension Mother   . Other Mother     stents  . Heart disease Mother   . Parkinsonism  Father   . Heart disease Father   . Cancer Father 75    prostate  . Hypertension Father   . Cancer Maternal Grandfather     prostate ?  . ADD / ADHD Son     ADHD  . Other Son 24    part of 1 lung removed- due to infection  . Stroke Maternal Grandmother   . Cancer Paternal Grandfather     Social History   Social History  . Marital Status: Married    Spouse Name: N/A  . Number of Children: N/A  . Years of Education: N/A   Occupational History  . Not on file.   Social History Main Topics  . Smoking status: Former Smoker -- 1.50 packs/day for 15 years    Start date: 10/07/1978  . Smokeless tobacco: Never Used  . Alcohol Use: Yes     Comment: 2 glasses of wine daily  . Drug Use: No  . 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   Other Topics Concern  . Not on file   Social History Narrative    Current Outpatient Prescriptions on File Prior to Visit  Medication Sig Dispense Refill  . aspirin 81 MG tablet Take 81 mg by mouth daily.    . fluticasone (FLONASE) 50 MCG/ACT nasal spray Place 2 sprays into both nostrils daily. 16 g 2  . hydrochlorothiazide (HYDRODIURIL) 25 MG tablet Take 1 tablet (25 mg total) by mouth daily. 90 tablet 1  . NON FORMULARY Occulite    . sertraline (ZOLOFT) 50 MG tablet Take 1 tablet (50 mg total) by mouth daily. 90 tablet 0  . simvastatin (ZOCOR) 20 MG tablet Take 1 tablet (20 mg total) by mouth every evening. 90 tablet 1  . terazosin (HYTRIN) 5 MG capsule Take 5 mg by mouth daily.     No current facility-administered medications on file prior to visit.    No Known Allergies  Review of Systems  Constitutional: Negative for fever. Positive for malaise/fatigue.  HENT: Negative for congestion.  Eyes: Negative for discharge.  Respiratory: Negative for shortness of breath.  Cardiovascular: Negative for chest pain, palpitations and leg swelling.   Gastrointestinal: Negative for nausea, abdominal pain and diarrhea.  Genitourinary: Positive for dysuria and urgency. Negative for hematuria and flank pain.  Musculoskeletal: Negative for myalgias and falls.  Skin: Negative for rash.  Neurological: Negative for loss of consciousness and headaches.  Endo/Heme/Allergies: Negative for polydipsia.  Psychiatric/Behavioral: Negative for depression and suicidal ideas. The patient is not nervous/anxious Objective  BP 154/81 mmHg  Pulse 75  Temp(Src) 97.9 F (36.6 C) (Oral)  Ht 5\' 5"  (1.651 m)  Wt 186 lb 2 oz (84.426 kg)  BMI 30.97 kg/m2  SpO2 98%  Physical Exam   Constitutional:  oriented to person, place, and time. appears well-nourished. No distress.  Eyes: EOM are normal. Pupils are equal, round, and reactive to light.  Cardiovascular: Normal rate and regular rhythm.  Pulmonary/Chest: Breath sounds normal.  Abdominal: Soft. Bowel sounds are normal.  Lymphadenopathy:    no cervical adenopathy.  Neurological: alert and oriented to person, place, and time. normal reflexes. No cranial nerve deficit.    Assessment & Plan  Hyperlipidemia -Tolerating statin -Encouraged heart healthy diet, avoid trans fats, minimize simple carbs and saturated fats.  -Increase exercise as tolerated  Obesity -Encouraged decrease po intake and increase exercise as tolerated.  -Will cut down dinner portion sizes and reduce amount of wine -Avoid trans fats, eat small, frequent meals every 4-5 hours with lean proteins, complex carbs and healthy fats. Minimize simple carbs, GMO foods.  Essential hypertension -Well controlled, no changes to meds. -Encouraged heart healthy diet and exercise as tolerated.   BPH (benign prostatic hyperplasia) -Good response to Tamulosin -Will continue to monitor symptoms  Lab Work Ordered -CBC, CMP, Lipid panel  Difficulty Sleeping -Discussed sleep habit improvements -Consider prescribing Belsomra if symptoms  persist  Annual Physical Exam -Patient encouraged to maintain heart healthy diet, regular exercise, adequate sleep. -Consider daily probiotics. Take medications as prescribed. Labs ordered and reviewed Patient seen and examined with student agree with documentation.  See separate note for documentation

## 2015-09-04 NOTE — Progress Notes (Signed)
Pre visit review using our clinic review tool, if applicable. No additional management support is needed unless otherwise documented below in the visit note. 

## 2015-09-04 NOTE — Patient Instructions (Addendum)
L Trytophan for sleep and call if want to try Belsomra   Cholesterol Cholesterol is a white, waxy, fat-like substance needed by your body in small amounts. The liver makes all the cholesterol you need. Cholesterol is carried from the liver by the blood through the blood vessels. Deposits of cholesterol (plaque) may build up on blood vessel walls. These make the arteries narrower and stiffer. Cholesterol plaques increase the risk for heart attack and stroke.  You cannot feel your cholesterol level even if it is very high. The only way to know it is high is with a blood test. Once you know your cholesterol levels, you should keep a record of the test results. Work with your health care provider to keep your levels in the desired range.  WHAT DO THE RESULTS MEAN?  Total cholesterol is a rough measure of all the cholesterol in your blood.   LDL is the so-called bad cholesterol. This is the type that deposits cholesterol in the walls of the arteries. You want this level to be low.   HDL is the good cholesterol because it cleans the arteries and carries the LDL away. You want this level to be high.  Triglycerides are fat that the body can either burn for energy or store. High levels are closely linked to heart disease.  WHAT ARE THE DESIRED LEVELS OF CHOLESTEROL?  Total cholesterol below 200.   LDL below 100 for people at risk, below 70 for those at very high risk.   HDL above 50 is good, above 60 is best.   Triglycerides below 150.  HOW CAN I LOWER MY CHOLESTEROL?  Diet. Follow your diet programs as directed by your health care provider.   Choose fish or white meat chicken and [place], roasted or baked. Limit fatty cuts of red meat, fried foods, and processed meats, such as sausage and lunch meats.   Eat lots of fresh fruits and vegetables.  Choose whole grains, beans, pasta, potatoes, and cereals.   Use only small amounts of olive, corn, or canola oils.   Avoid butter,  mayonnaise, shortening, or palm kernel oils.  Avoid foods with trans fats.   Drink skim or nonfat milk and eat low-fat or nonfat yogurt and cheeses. Avoid whole milk, cream, ice cream, egg yolks, and full-fat cheeses.   Healthy desserts include angel food cake, ginger snaps, animal crackers, hard candy, popsicles, and low-fat or nonfat frozen yogurt. Avoid pastries, cakes, pies, and cookies.   Exercise. Follow your exercise programs as directed by your health care provider.   A regular program helps decrease LDL and raise HDL.   A regular program helps with weight control.   Do things that increase your activity level like gardening, walking, or taking the stairs. Ask your health care provider about how you can be more active in your daily life.   Medicine. Take medicine only as directed by your health care provider.   Medicine may be prescribed by your health care provider to help lower cholesterol and decrease the risk for heart disease.   If you have several risk factors, you may need medicine even if your levels are normal.   This information is not intended to replace advice given to you by your health care provider. Make sure you discuss any questions you have with your health care provider.   Document Released: 06/18/2001 Document Revised: 10/14/2014 Document Reviewed: 07/07/2013 Elsevier Interactive Patient Education [employer].

## 2015-09-09 NOTE — Assessment & Plan Note (Signed)
Well controlled, no changes to meds. Encouraged heart healthy diet such as the DASH diet and exercise as tolerated.  °

## 2015-09-09 NOTE — Progress Notes (Signed)
Subjective:    Patient ID: Walter Brown, male    DOB: 01-Aug-1941, 74 y.o.   MRN: [id number]  Chief Complaint  Patient presents with  . Follow-up    HPI Patient is in today for follow-up. He has been feeling well. No recent illness. Has been staying active. Trying to maintain a heart healthy diet. No recent hospitalizations. Has been exercising. Denies CP/palp/SOB/HA/congestion/fevers/GI or GU c/o. Taking meds as prescribed  Past Medical History  Diagnosis Date  . BPH (benign prostatic hyperplasia)   . Chicken pox as child  . [place] measles as a child  . Cancer ([place])     skin- on nose- basal cell (20 yrs ago) forehead 1 year ago  . Thrombocytopenia ([place])   . Anemia     mild  . Asthma     childhood  . Hyperlipidemia   . Hypertension   . Preventative health care 08/01/2012  . Depression 08/01/2012  . Cerumen impaction 08/10/2012  . Otitis externa 08/10/2012  . Medicare annual wellness visit, subsequent 11/07/2013  . Right shoulder pain 09/26/2014  . Macular degeneration     Past Surgical History  Procedure Laterality Date  . Hydrocele excision / repair  2012    b/l  . Skin cancer excision      nose and forehead  . Belpharoptosis repair      very young, b/l    Family History  Problem Relation Age of Onset  . Hypertension Mother   . Other Mother     stents  . Heart disease Mother   . Parkinsonism Father   . Heart disease Father   . Cancer Father 80    prostate  . Hypertension Father   . Cancer Maternal Grandfather     prostate ?  . ADD / ADHD Son     ADHD  . Other Son 24    part of 1 lung removed- due to infection  . Stroke Maternal Grandmother   . Cancer Paternal Grandfather     Social History   Social History  . Marital Status: Married    Spouse Name: N/A  . Number of Children: N/A  . Years of Education: N/A   Occupational History  . Not on file.   Social History Main Topics  . Smoking status: Former Smoker -- 1.50 packs/day for 15 years   Start date: 10/07/1978  . Smokeless tobacco: Never Used  . Alcohol Use: Yes     Comment: 2 glasses of wine daily  . Drug Use: No  . 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   Other Topics Concern  . Not on file   Social History Narrative    Outpatient Prescriptions Prior to Visit  Medication Sig Dispense Refill  . aspirin 81 MG tablet Take 81 mg by mouth daily.    . fluticasone (FLONASE) 50 MCG/ACT nasal spray Place 2 sprays into both nostrils daily. 16 g 2  . hydrochlorothiazide (HYDRODIURIL) 25 MG tablet Take 1 tablet (25 mg total) by mouth daily. 90 tablet 1  . NON FORMULARY Occulite    . sertraline (ZOLOFT) 50 MG tablet Take 1 tablet (50 mg total) by mouth daily. 90 tablet 0  . simvastatin (ZOCOR) 20 MG tablet Take 1 tablet (20 mg total) by mouth every evening. 90 tablet 1  . terazosin (HYTRIN) 5 MG capsule Take 5 mg by mouth daily.    Marland Kitchen  simvastatin (ZOCOR) 20 MG tablet TAKE 1 TABLET EVERY EVENING 90 tablet 1   No facility-administered medications prior to visit.    No Known Allergies  Review of Systems  Constitutional: Negative for fever and malaise/fatigue.  HENT: Negative for congestion.   Eyes: Negative for discharge.  Respiratory: Negative for shortness of breath.   Cardiovascular: Negative for chest pain, palpitations and leg swelling.  Gastrointestinal: Negative for nausea and abdominal pain.  Genitourinary: Negative for dysuria.  Musculoskeletal: Negative for falls.  Skin: Negative for rash.  Neurological: Negative for loss of consciousness and headaches.  Endo/Heme/Allergies: Negative for environmental allergies.  Psychiatric/Behavioral: Negative for depression. The patient is not nervous/anxious.        Objective:    Physical Exam  Constitutional: He is oriented to person, place, and time. He appears well-developed and well-nourished. No distress.  HENT:    Head: Normocephalic and atraumatic.  Nose: Nose normal.  Eyes: Right eye exhibits no discharge. Left eye exhibits no discharge.  Neck: Normal range of motion. Neck supple.  Cardiovascular: Normal rate and regular rhythm.   No murmur heard. Pulmonary/Chest: Effort normal and breath sounds normal.  Abdominal: Soft. Bowel sounds are normal. There is no tenderness.  Musculoskeletal: He exhibits no edema.  Neurological: He is alert and oriented to person, place, and time.  Skin: Skin is warm and dry.  Psychiatric: He has a normal mood and affect.  Nursing note and vitals reviewed.   BP 128/80 mmHg  Pulse 75  Temp(Src) 97.9 F (36.6 C) (Oral)  Ht 5\' 5"  (1.651 m)  Wt 186 lb 2 oz (84.426 kg)  BMI 30.97 kg/m2  SpO2 98% Wt Readings from Last 3 Encounters:  09/04/15 186 lb 2 oz (84.426 kg)  03/03/15 179 lb (81.194 kg)  09/22/14 185 lb 9.6 oz (84.188 kg)     Lab Results  Component Value Date   WBC 7.1 09/04/2015   HGB 14.4 09/04/2015   HCT 43.6 09/04/2015   PLT 149.0* 09/04/2015   GLUCOSE 98 09/04/2015   CHOL 157 09/04/2015   TRIG 128.0 09/04/2015   HDL 62.00 09/04/2015   LDLCALC 69 09/04/2015   ALT 17 09/04/2015   AST 19 09/04/2015   NA 140 09/04/2015   K 3.8 09/04/2015   CL 105 09/04/2015   CREATININE 1.03 09/04/2015   BUN 18 09/04/2015   CO2 29 09/04/2015   TSH 1.50 09/04/2015    Lab Results  Component Value Date   TSH 1.50 09/04/2015   Lab Results  Component Value Date   WBC 7.1 09/04/2015   HGB 14.4 09/04/2015   HCT 43.6 09/04/2015   MCV 93.1 09/04/2015   PLT 149.0* 09/04/2015   Lab Results  Component Value Date   NA 140 09/04/2015   K 3.8 09/04/2015   CO2 29 09/04/2015   GLUCOSE 98 09/04/2015   BUN 18 09/04/2015   CREATININE 1.03 09/04/2015   BILITOT 0.7 09/04/2015   ALKPHOS 52 09/04/2015   AST 19 09/04/2015   ALT 17 09/04/2015   PROT 6.5 09/04/2015   ALBUMIN 4.0 09/04/2015   CALCIUM 9.4 09/04/2015   GFR 74.95 09/04/2015   Lab Results   Component Value Date   CHOL 157 09/04/2015   Lab Results  Component Value Date   HDL 62.00 09/04/2015   Lab Results  Component Value Date   LDLCALC 69 09/04/2015   Lab Results  Component Value Date   TRIG 128.0 09/04/2015   Lab Results  Component Value Date  CHOLHDL 3 09/04/2015   No results found for: HGBA1C     Assessment & Plan:   Problem List Items Addressed This Visit    Hyperlipidemia    Encouraged heart healthy diet, increase exercise, avoid trans fats, consider a krill oil cap daily      Hypertension    Well controlled, no changes to meds. Encouraged heart healthy diet such as the DASH diet and exercise as tolerated.       Overweight    Encouraged DASH diet, decrease po intake and increase exercise as tolerated. Needs 7-8 hours of sleep nightly. Avoid trans fats, eat small, frequent meals every 4-5 hours with lean proteins, complex carbs and healthy fats. Minimize simple carbs,       Thrombocytopenia (HCC)    Mild, stable. asymptomatic       Other Visit Diagnoses    Hyperlipidemia, mixed    -  Primary    Relevant Orders    CBC (Completed)    Lipid panel (Completed)    Comprehensive metabolic panel (Completed)    TSH (Completed)    Benign essential HTN        Relevant Orders    CBC (Completed)    Lipid panel (Completed)    Comprehensive metabolic panel (Completed)    TSH (Completed)       I am having Mr. Spiering maintain his terazosin, NON FORMULARY, aspirin, fluticasone, simvastatin, hydrochlorothiazide, and sertraline.  No orders of the defined types were placed in this encounter.     Penni Homans, MD

## 2015-09-09 NOTE — Assessment & Plan Note (Signed)
Encouraged heart healthy diet, increase exercise, avoid trans fats, consider a krill oil cap daily 

## 2015-09-09 NOTE — Assessment & Plan Note (Signed)
Encouraged DASH diet, decrease po intake and increase exercise as tolerated. Needs 7-8 hours of sleep nightly. Avoid trans fats, eat small, frequent meals every 4-5 hours with lean proteins, complex carbs and healthy fats. Minimize simple carbs, 

## 2015-09-09 NOTE — Assessment & Plan Note (Signed)
Mild, stable. asymptomatic

## 2015-09-14 DIAGNOSIS — L570 Actinic keratosis: Secondary | ICD-10-CM | POA: Diagnosis not present

## 2015-10-03 DIAGNOSIS — N401 Enlarged prostate with lower urinary tract symptoms: Secondary | ICD-10-CM | POA: Diagnosis not present

## 2015-10-10 DIAGNOSIS — N401 Enlarged prostate with lower urinary tract symptoms: Secondary | ICD-10-CM | POA: Diagnosis not present

## 2015-10-10 DIAGNOSIS — R351 Nocturia: Secondary | ICD-10-CM | POA: Diagnosis not present

## 2015-11-14 DIAGNOSIS — L570 Actinic keratosis: Secondary | ICD-10-CM | POA: Diagnosis not present

## 2015-11-14 DIAGNOSIS — Z23 Encounter for immunization: Secondary | ICD-10-CM | POA: Diagnosis not present

## 2015-11-27 ENCOUNTER — Other Ambulatory Visit: Payer: Self-pay | Admitting: Family Medicine

## 2015-11-27 MED ORDER — SERTRALINE HCL 50 MG PO TABS: 50.0000 mg | ORAL_TABLET | Freq: Every day | ORAL | Status: DC

## 2015-11-27 NOTE — Telephone Encounter (Signed)
Zoloft sent to [facility].

## 2015-11-27 NOTE — Telephone Encounter (Signed)
Refilled rx with #90 with 1 rf.

## 2015-11-28 MED ORDER — SERTRALINE HCL 50 MG PO TABS: 50.0000 mg | ORAL_TABLET | Freq: Every day | ORAL | Status: DC

## 2015-12-26 DIAGNOSIS — H353112 Nonexudative age-related macular degeneration, right eye, intermediate dry stage: Secondary | ICD-10-CM | POA: Diagnosis not present

## 2015-12-26 DIAGNOSIS — H524 Presbyopia: Secondary | ICD-10-CM | POA: Diagnosis not present

## 2015-12-26 DIAGNOSIS — H2513 Age-related nuclear cataract, bilateral: Secondary | ICD-10-CM | POA: Diagnosis not present

## 2016-03-04 ENCOUNTER — Ambulatory Visit: Payer: Self-pay

## 2016-03-05 ENCOUNTER — Other Ambulatory Visit: Payer: Self-pay | Admitting: Family Medicine

## 2016-03-05 ENCOUNTER — Telehealth: Payer: Self-pay | Admitting: Family Medicine

## 2016-03-05 ENCOUNTER — Ambulatory Visit (INDEPENDENT_AMBULATORY_CARE_PROVIDER_SITE_OTHER): Payer: Medicare HMO

## 2016-03-05 DIAGNOSIS — D509 Iron deficiency anemia, unspecified: Secondary | ICD-10-CM

## 2016-03-05 DIAGNOSIS — Z0000 Encounter for general adult medical examination without abnormal findings: Secondary | ICD-10-CM | POA: Diagnosis not present

## 2016-03-05 DIAGNOSIS — E785 Hyperlipidemia, unspecified: Secondary | ICD-10-CM

## 2016-03-05 DIAGNOSIS — I10 Essential (primary) hypertension: Secondary | ICD-10-CM

## 2016-03-05 NOTE — Telephone Encounter (Signed)
Please order him labs prior to his appt with me in June, CMP, CBC, TSH,  Lipid for anemia, HTN and hyperlipidemia

## 2016-03-05 NOTE — Telephone Encounter (Signed)
Labs ordered, patient contacted and scheduled lab appt. For 03/29/2016.

## 2016-03-05 NOTE — Progress Notes (Signed)
Pre visit review using our clinic review tool, if applicable. No additional management support is needed unless otherwise documented below in the visit note. 

## 2016-03-05 NOTE — Telephone Encounter (Signed)
Pt had AWV today with nurse. He was due for CPE with Dr. Charlett Blake but it appears it was not scheduled. Pt scheduled for 15 min f/u with Dr. Charlett Blake 6/29. He would like labs done prior to appt. Please advise if I can schedule him before.

## 2016-03-05 NOTE — Patient Instructions (Signed)
Patient would like to continue weight loss goal of 170 lbs. Losing about 11 lbs. in the next 3 months. 1-2 lbs each week.  Increase water consumption from 2 glasses a day to at least 4 glasses a day.  Decrease consuming artificial sweeteners from 3 a day to only one.   Keep follow-up appointment with the Urologist in November 2017.  Schedule an annual physical or 6 month follow-up with PCP.  Bring in a copy of your advanced directive to your next appointment.

## 2016-03-05 NOTE — Addendum Note (Signed)
Addended by: Eduard Roux E on: 03/05/2016 11:58 AM   Modules accepted: Level of Service

## 2016-03-05 NOTE — Progress Notes (Addendum)
Subjective:   Walter Brown is a 75 y.o. male who presents for Medicare Annual/Subsequent preventive examination.  Review of Systems: No ROS Sleep patterns: Patient reports that he normally receives 5-6 hours of solid rest; normally arises at 5:00 AM each morning; occasionally takes a nap during the day; awake once during the night to use the restroom. Home safety/Smoke Alarms: Patient voices that he feels safe at home. Lives in a single-family home with his wife. Smoke alarms are present in the home. Firearm safety: Patient reports that firearms are present in the home, but are secured in a safe place. Seat belt safety/bike helmet: Patient wears a seat belt and bike helmet at all times. Sun exposure: Patient reports moderate sun exposure, but applies sunscreen when necessary.  Counseling: Patient reports no counseling at this time. Dental- Patient reports that he sees the dentist every six months for regular check up & teeth cleaning; last seen 09/2015. Males:  CCS- Last colonoscopy 07/05/09 with Dr. Mitchell Heir at [facility]; no polyps found; follow-up in 10 years PSA- 02/2015; 3.5; seen at [facility]  Cardiac Risk Factors include: male gender;hypertension;advanced age (>64men, >46 women)     Objective:    Vitals: BP 126/78 mmHg  Pulse 61  Ht 5\' 6"  (1.676 m)  Wt 184 lb (83.462 kg)  BMI 29.71 kg/m2  SpO2 98%  Body mass index is 29.71 kg/(m^2).  Tobacco History  Smoking status  . Former Smoker -- 1.50 packs/day for 15 years  . Start date: 10/07/1978  Smokeless tobacco  . Never Used     Counseling given: Not Answered   Past Medical History  Diagnosis Date  . BPH (benign prostatic hyperplasia)   . Chicken pox as child  . [place] measles as a child  . Cancer ([place])     skin- on nose- basal cell (20 yrs ago) forehead 1 year ago  . Thrombocytopenia ([place])   . Anemia     mild  . Asthma     childhood  . Hyperlipidemia   . Hypertension   . Preventative health care  08/01/2012  . Depression 08/01/2012  . Cerumen impaction 08/10/2012  . Otitis externa 08/10/2012  . Medicare annual wellness visit, subsequent 11/07/2013  . Right shoulder pain 09/26/2014  . Macular degeneration    Past Surgical History  Procedure Laterality Date  . Hydrocele excision / repair  2012    b/l  . Skin cancer excision      nose and forehead  . Belpharoptosis repair      very young, b/l   Family History  Problem Relation Age of Onset  . Hypertension Mother   . Other Mother     stents  . Heart disease Mother   . Parkinsonism Father   . Heart disease Father   . Cancer Father 58    prostate  . Hypertension Father   . Cancer Maternal Grandfather     prostate ?  . ADD / ADHD Son     ADHD  . Other Son 24    part of 1 lung removed- due to infection  . Stroke Maternal Grandmother   . Cancer Paternal Grandfather    History  Sexual Activity  . 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    Outpatient Encounter Prescriptions as of 03/05/2016  Medication Sig  . aspirin 81 MG tablet Take 81 mg by mouth daily.  Marland Kitchen  fluticasone (FLONASE) 50 MCG/ACT nasal spray Place 2 sprays into both nostrils daily.  . hydrochlorothiazide (HYDRODIURIL) 25 MG tablet TAKE ONE TABLET BY MOUTH ONCE DAILY  . Multiple Vitamins-Minerals (ICAPS AREDS 2) CAPS Take 1 capsule by mouth 2 (two) times daily.  . Multiple Vitamins-Minerals (MULTIVITAMIN MEN 50+ PO) Take 1 tablet by mouth daily.  . sertraline (ZOLOFT) 50 MG tablet Take 1 tablet (50 mg total) by mouth daily.  . simvastatin (ZOCOR) 20 MG tablet Take 1 tablet (20 mg total) by mouth every evening.  . terazosin (HYTRIN) 5 MG capsule Take 5 mg by mouth daily.  . [DISCONTINUED] NON FORMULARY Occulite   No facility-administered encounter medications on file as of 03/05/2016.    Activities of Daily Living In your present state of health, do you have  any difficulty performing the following activities: 03/05/2016  Hearing? Y  Vision? N  Difficulty concentrating or making decisions? Y  Walking or climbing stairs? N  Dressing or bathing? N  Doing errands, shopping? N  Preparing Food and eating ? N  Using the Toilet? N  In the past six months, have you accidently leaked urine? Y  Do you have problems with loss of bowel control? N  Managing your Medications? N  Managing your Finances? N  Housekeeping or managing your Housekeeping? N    Patient Care Team: [name], MD as PCP - General (Family Medicine) Devra Dopp, MD as Referring Physician (Dermatology) Luberta Mutter, MD as Consulting Physician (Ophthalmology) Ardis Hughs, MD as Consulting Physician (Urology)   Assessment:    Assessment deferred to PCP to be completed at the patient's next CPE.  Diet: Breakfast - cup of coffee; some sort of cereal hot/cold  Lunch- deli sandwich (turkey/chicken; low-calorie wheat bread) & fruit (cantaloupe/pineapples)  Dinner - Meat & two vegetables (at least one green vegetable); seldomly eat mash potatoes or rice); typically without bread; normally for a beverage it's water or a glass of red wine. Snacks may consist of walnuts or a Klondike ice cream bar.  Exercise Activities and Dietary recommendations Current Exercise Habits: Structured exercise class (pt. reports that he belongs to a fitness center in [place] & works out 4-5 times a week for an hour), Type of exercise: calisthenics;strength training/weights, Time (Minutes): 60, Frequency (Times/Week): 5, Weekly Exercise (Minutes/Week): 300, Intensity: Moderate, Exercise limited by: None identified  Goals    None     Fall Risk Fall Risk  03/05/2016 03/03/2015 11/04/2013  Falls in the past year? No No No   Depression Screen PHQ 2/9 Scores 03/05/2016 03/03/2015 11/04/2013  PHQ - 2 Score 1 0 1    Cognitive Testing MMSE - Mini Mental State Exam 03/05/2016    Orientation to time 5  Orientation to Place 5  Registration 3  Attention/ Calculation 5  Recall 3  Language- name 2 objects 2  Language- repeat 1  Language- follow 3 step command 3  Language- read & follow direction 1  Write a sentence 1  Copy design 1  Total score 30    Immunization History  Administered Date(s) Administered  . Influenza Split 07/02/2012  . Influenza Whole 06/07/2013  . Influenza,inj,Quad PF,36+ Mos 06/21/2014  . Pneumococcal Conjugate-13 11/04/2013  . Pneumococcal Polysaccharide-23 10/08/2007  . Tdap 11/04/2013  . Zoster 10/07/1998   Screening Tests Health Maintenance  Topic Date Due  . INFLUENZA VACCINE  05/07/2016  . COLONOSCOPY  10/08/2019  . TETANUS/TDAP  11/05/2023  . ZOSTAVAX  Completed  . PNA vac Low  Risk Adult  Completed      Plan:    Goals:   Patient would like to continue weight loss goal of 170 lbs. Losing about 11 lbs. in the next 3 months. 1-2 lbs each week.  Increase water consumption from 2 glasses a day to at least 4 glasses a day.  Decrease consuming artificial sweeteners from 3 a day to only one.   Keep follow-up appointment with the Urologist in November 2017.  Schedule an annual physical or 6 month follow-up with PCP.  Bring in a copy of your advanced directive to your next appointment.   During the course of the visit the patient was educated and counseled about the following appropriate screening and preventive services:   Vaccines to include Pneumoccal, Influenza, Hepatitis B, Td, Zostavax, HCV  Electrocardiogram  Cardiovascular Disease  Colorectal cancer screening  Diabetes screening  Prostate Cancer Screening  Glaucoma screening  Nutrition counseling   Smoking cessation counseling  Patient Instructions (the written plan) was given to the patient.    Eduard Roux, RN  03/05/2016

## 2016-03-05 NOTE — Progress Notes (Signed)
RN AWV note reviewed. Agree with documention and plan. 

## 2016-03-29 ENCOUNTER — Other Ambulatory Visit (INDEPENDENT_AMBULATORY_CARE_PROVIDER_SITE_OTHER): Payer: Medicare HMO

## 2016-03-29 DIAGNOSIS — E785 Hyperlipidemia, unspecified: Secondary | ICD-10-CM

## 2016-03-29 DIAGNOSIS — D509 Iron deficiency anemia, unspecified: Secondary | ICD-10-CM | POA: Diagnosis not present

## 2016-03-29 DIAGNOSIS — I10 Essential (primary) hypertension: Secondary | ICD-10-CM

## 2016-03-29 LAB — COMPREHENSIVE METABOLIC PANEL
ALBUMIN: 4.2 g/dL (ref 3.5–5.2)
ALK PHOS: 48 U/L (ref 39–117)
ALT: 16 U/L (ref 0–53)
AST: 20 U/L (ref 0–37)
BILIRUBIN TOTAL: 0.8 mg/dL (ref 0.2–1.2)
BUN: 14 mg/dL (ref 6–23)
CO2: 32 mEq/L (ref 19–32)
Calcium: 9.4 mg/dL (ref 8.4–10.5)
Chloride: 103 mEq/L (ref 96–112)
Creatinine, Ser: 1.09 mg/dL (ref 0.40–1.50)
GFR: 70.1 mL/min (ref >60.00)
GLUCOSE: 80 mg/dL (ref 70–99)
Potassium: 4.1 mEq/L (ref 3.5–5.1)
SODIUM: 138 meq/L (ref 135–145)
TOTAL PROTEIN: 6.6 g/dL (ref 6.0–8.3)

## 2016-03-29 LAB — CBC WITH DIFFERENTIAL/PLATELET
BASOS ABS: 0 10*3/uL (ref 0.0–0.1)
Basophils Relative: 0.6 % (ref 0.0–3.0)
EOS ABS: 0.1 10*3/uL (ref 0.0–0.7)
Eosinophils Relative: 1.3 % (ref 0.0–5.0)
HCT: 43.8 % (ref 39.0–52.0)
Hemoglobin: 14.6 g/dL (ref 13.0–17.0)
Lymphocytes Relative: 27.6 % (ref 12.0–46.0)
Lymphs Abs: 1.9 10*3/uL (ref 0.7–4.0)
MCHC: 33.3 g/dL (ref 30.0–36.0)
MCV: 92.8 fl (ref 78.0–100.0)
MONO ABS: 0.7 10*3/uL (ref 0.1–1.0)
Monocytes Relative: 10.5 % (ref 3.0–12.0)
NEUTROS PCT: 60 % (ref 43.0–77.0)
Neutro Abs: 4.2 10*3/uL (ref 1.4–7.7)
Platelets: 140 10*3/uL — ABNORMAL LOW (ref 150.0–400.0)
RBC: 4.72 Mil/uL (ref 4.22–5.81)
RDW: 14.4 % (ref 11.5–15.5)
WBC: 7 10*3/uL (ref 4.0–10.5)

## 2016-03-29 LAB — LIPID PANEL
CHOLESTEROL: 150 mg/dL (ref 0–200)
HDL: 63.4 mg/dL (ref >39.00)
LDL CALC: 68 mg/dL (ref 0–99)
NonHDL: 87.05
Total CHOL/HDL Ratio: 2
Triglycerides: 93 mg/dL (ref 0.0–149.0)
VLDL: 18.6 mg/dL (ref 0.0–40.0)

## 2016-03-29 LAB — TSH: TSH: 1.1 u[IU]/mL (ref 0.35–4.50)

## 2016-04-04 ENCOUNTER — Encounter: Payer: Self-pay | Admitting: Family Medicine

## 2016-04-04 ENCOUNTER — Ambulatory Visit (INDEPENDENT_AMBULATORY_CARE_PROVIDER_SITE_OTHER): Payer: Medicare HMO | Admitting: Family Medicine

## 2016-04-04 VITALS — BP 120/82 | HR 67 | Temp 97.9°F | Ht 66.0 in | Wt 183.5 lb

## 2016-04-04 DIAGNOSIS — E785 Hyperlipidemia, unspecified: Secondary | ICD-10-CM

## 2016-04-04 DIAGNOSIS — I10 Essential (primary) hypertension: Secondary | ICD-10-CM | POA: Diagnosis not present

## 2016-04-04 DIAGNOSIS — D649 Anemia, unspecified: Secondary | ICD-10-CM

## 2016-04-04 DIAGNOSIS — F329 Major depressive disorder, single episode, unspecified: Secondary | ICD-10-CM | POA: Diagnosis not present

## 2016-04-04 DIAGNOSIS — R69 Illness, unspecified: Secondary | ICD-10-CM | POA: Diagnosis not present

## 2016-04-04 DIAGNOSIS — D696 Thrombocytopenia, unspecified: Secondary | ICD-10-CM

## 2016-04-04 DIAGNOSIS — F32A Depression, unspecified: Secondary | ICD-10-CM

## 2016-04-04 MED ORDER — SIMVASTATIN 20 MG PO TABS: 10.0000 mg | ORAL_TABLET | ORAL | Status: DC

## 2016-04-04 NOTE — Assessment & Plan Note (Signed)
Doing well off of Sertraline for 6-8 weeks

## 2016-04-04 NOTE — Patient Instructions (Signed)
Thrombocytopenia °Thrombocytopenia is a condition in which there is an abnormally small number of platelets in your blood. Platelets are also called thrombocytes. Platelets are needed for blood clotting. °CAUSES °Thrombocytopenia is caused by:  °· Decreased production of platelets. This can be caused by: °¨ Aplastic anemia in which your bone marrow quits making blood cells. °¨ Cancer in the bone marrow. °¨ Use of certain medicines, including chemotherapy. °¨ Infection in the bone marrow. °¨ Heavy alcohol consumption. °· Increased destruction of platelets. This can be caused by: °¨ Certain immune diseases. °¨ Use of certain drugs. °¨ Certain blood clotting disorders. °¨ Certain inherited disorders. °¨ Certain bleeding disorders. °¨ Pregnancy. °· Having an enlarged spleen (hypersplenism). In hypersplenism, the spleen gathers up platelets from circulation. This means the platelets are not available to help with blood clotting. The spleen can enlarge due to cirrhosis or other conditions. °SYMPTOMS  °The symptoms of thrombocytopenia are side effects of poor blood clotting. Some of these are: °· Abnormal bleeding. °· Nosebleeds. °· Heavy menstrual periods. °· Blood in the urine or stools. °· Purpura. This is a purplish discoloration in the skin produced by small bleeding vessels near the surface of the skin. °· Bruising. °· A rash that may be petechial. This looks like pinpoint, purplish-red spots on the skin and mucous membranes. It is caused by bleeding from small blood vessels (capillaries). °DIAGNOSIS  °Your caregiver will make this diagnosis based on your exam and blood tests. Sometimes, a bone marrow study is done to look for the original cells (megakaryocytes) that make platelets. °TREATMENT  °Treatment depends on the cause of the condition. °· Medicines may be given to help protect your platelets from being destroyed. °· In some cases, a replacement (transfusion) of platelets may be required to stop or prevent  bleeding. °· Sometimes, the spleen must be surgically removed. °HOME CARE INSTRUCTIONS  °· Check the skin and linings inside your mouth for bruising or bleeding as directed by your caregiver. °· Check your sputum, urine, and stool for blood as directed by your caregiver. °· Do not return to any activities that could cause bumps or bruises until your caregiver says it is okay. °· Take extra care not to cut yourself when shaving or when using scissors, needles, knives, and other tools. °· Take extra care not to burn yourself when ironing or cooking. °· Ask your caregiver if it is okay for you to drink alcohol. °· Only take over-the-counter or prescription medicines as directed by your caregiver. °· Notify all your caregivers, including dentists and eye doctors, about your condition. °SEEK IMMEDIATE MEDICAL CARE IF:  °· You develop active bleeding from anywhere in your body. °· You develop unexplained bruising or bleeding. °· You have blood in your sputum, urine, or stool. °MAKE SURE YOU: °· Understand these instructions. °· Will watch your condition. °· Will get help right away if you are not doing well or get worse. °  °This information is not intended to replace advice given to you by your health care provider. Make sure you discuss any questions you have with your health care provider. °  °Document Released: 09/23/2005 Document Revised: 12/16/2011 Document Reviewed: 03/27/2015 °Elsevier Interactive Patient Education ©2016 [employer] ° °

## 2016-04-04 NOTE — Progress Notes (Signed)
Pre visit review using our clinic review tool, if applicable. No additional management support is needed unless otherwise documented below in the visit note. 

## 2016-04-04 NOTE — Assessment & Plan Note (Signed)
Resolved on recent blood draw

## 2016-04-04 NOTE — Assessment & Plan Note (Signed)
Mild, asymptomatic continue to monitor

## 2016-04-04 NOTE — Assessment & Plan Note (Signed)
Well controlled, no changes to meds. Encouraged heart healthy diet such as the DASH diet and exercise as tolerated.  °

## 2016-04-04 NOTE — Assessment & Plan Note (Signed)
Cholesterol is good taking his Simvastatin 20 mg a couple times a week. Encouraged to change to 10 mg qod. Will recheck in 6 month.
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
## 2016-04-18 DIAGNOSIS — R69 Illness, unspecified: Secondary | ICD-10-CM | POA: Diagnosis not present

## 2016-04-22 DIAGNOSIS — E7800 Pure hypercholesterolemia, unspecified: Secondary | ICD-10-CM | POA: Diagnosis not present

## 2016-04-22 DIAGNOSIS — Z0000 Encounter for general adult medical examination without abnormal findings: Secondary | ICD-10-CM | POA: Diagnosis not present

## 2016-04-22 DIAGNOSIS — I10 Essential (primary) hypertension: Secondary | ICD-10-CM | POA: Diagnosis not present

## 2016-05-11 ENCOUNTER — Other Ambulatory Visit: Payer: Self-pay | Admitting: Family Medicine

## 2016-06-18 ENCOUNTER — Other Ambulatory Visit: Payer: Self-pay | Admitting: Family Medicine

## 2016-07-06 DIAGNOSIS — H6123 Impacted cerumen, bilateral: Secondary | ICD-10-CM | POA: Diagnosis not present

## 2016-07-11 DIAGNOSIS — L821 Other seborrheic keratosis: Secondary | ICD-10-CM | POA: Diagnosis not present

## 2016-07-11 DIAGNOSIS — Z85828 Personal history of other malignant neoplasm of skin: Secondary | ICD-10-CM | POA: Diagnosis not present

## 2016-07-11 DIAGNOSIS — Z23 Encounter for immunization: Secondary | ICD-10-CM | POA: Diagnosis not present

## 2016-07-11 DIAGNOSIS — L814 Other melanin hyperpigmentation: Secondary | ICD-10-CM | POA: Diagnosis not present

## 2016-07-11 DIAGNOSIS — Z86018 Personal history of other benign neoplasm: Secondary | ICD-10-CM | POA: Diagnosis not present

## 2016-07-11 DIAGNOSIS — D1800 Hemangioma unspecified site: Secondary | ICD-10-CM | POA: Diagnosis not present

## 2016-07-11 DIAGNOSIS — D225 Melanocytic nevi of trunk: Secondary | ICD-10-CM | POA: Diagnosis not present

## 2016-07-11 DIAGNOSIS — L570 Actinic keratosis: Secondary | ICD-10-CM | POA: Diagnosis not present

## 2016-07-23 DIAGNOSIS — L570 Actinic keratosis: Secondary | ICD-10-CM | POA: Diagnosis not present

## 2016-08-19 ENCOUNTER — Other Ambulatory Visit: Payer: Self-pay | Admitting: Family Medicine

## 2016-10-04 ENCOUNTER — Ambulatory Visit (INDEPENDENT_AMBULATORY_CARE_PROVIDER_SITE_OTHER): Payer: Medicare HMO | Admitting: Family Medicine

## 2016-10-04 ENCOUNTER — Encounter: Payer: Self-pay | Admitting: Family Medicine

## 2016-10-04 ENCOUNTER — Ambulatory Visit (HOSPITAL_BASED_OUTPATIENT_CLINIC_OR_DEPARTMENT_OTHER)
Admission: RE | Admit: 2016-10-04 | Discharge: 2016-10-04 | Disposition: A | Discharge status: Home / Self Care | Payer: Medicare HMO | Source: Ambulatory Visit | Attending: Family Medicine | Admitting: Family Medicine

## 2016-10-04 VITALS — BP 118/66 | Temp 98.6°F | Ht 66.0 in | Wt 175.2 lb

## 2016-10-04 DIAGNOSIS — R109 Unspecified abdominal pain: Secondary | ICD-10-CM

## 2016-10-04 DIAGNOSIS — Z0001 Encounter for general adult medical examination with abnormal findings: Secondary | ICD-10-CM

## 2016-10-04 DIAGNOSIS — C801 Malignant (primary) neoplasm, unspecified: Secondary | ICD-10-CM

## 2016-10-04 DIAGNOSIS — I10 Essential (primary) hypertension: Secondary | ICD-10-CM | POA: Diagnosis not present

## 2016-10-04 DIAGNOSIS — R350 Frequency of micturition: Secondary | ICD-10-CM | POA: Diagnosis not present

## 2016-10-04 DIAGNOSIS — M549 Dorsalgia, unspecified: Secondary | ICD-10-CM | POA: Diagnosis not present

## 2016-10-04 DIAGNOSIS — Z0000 Encounter for general adult medical examination without abnormal findings: Secondary | ICD-10-CM

## 2016-10-04 DIAGNOSIS — E785 Hyperlipidemia, unspecified: Secondary | ICD-10-CM | POA: Diagnosis not present

## 2016-10-04 DIAGNOSIS — M5136 Other intervertebral disc degeneration, lumbar region: Secondary | ICD-10-CM | POA: Insufficient documentation

## 2016-10-04 DIAGNOSIS — D696 Thrombocytopenia, unspecified: Secondary | ICD-10-CM

## 2016-10-04 DIAGNOSIS — M545 Low back pain: Secondary | ICD-10-CM | POA: Diagnosis not present

## 2016-10-04 LAB — URINALYSIS WITH CULTURE, IF INDICATED
BILIRUBIN URINE: NEGATIVE
HGB URINE DIPSTICK: NEGATIVE
Ketones, ur: NEGATIVE
Leukocytes, UA: NEGATIVE
Nitrite: NEGATIVE
PH: 6 (ref 5.0–8.0)
Specific Gravity, Urine: 1.02 (ref 1.000–1.030)
TOTAL PROTEIN, URINE-UPE24: NEGATIVE
Urine Glucose: NEGATIVE
Urobilinogen, UA: 0.2 (ref 0.0–1.0)

## 2016-10-04 LAB — CBC
HCT: 42.9 % (ref 39.0–52.0)
Hemoglobin: 14.6 g/dL (ref 13.0–17.0)
MCHC: 34.2 g/dL (ref 30.0–36.0)
MCV: 92.5 fl (ref 78.0–100.0)
Platelets: 133 10*3/uL — ABNORMAL LOW (ref 150.0–400.0)
RBC: 4.64 Mil/uL (ref 4.22–5.81)
RDW: 13.6 % (ref 11.5–15.5)
WBC: 7.9 10*3/uL (ref 4.0–10.5)

## 2016-10-04 LAB — LIPID PANEL
CHOL/HDL RATIO: 2
Cholesterol: 145 mg/dL (ref 0–200)
HDL: 69 mg/dL (ref >39.00)
LDL Cholesterol: 58 mg/dL (ref 0–99)
NONHDL: 75.52
TRIGLYCERIDES: 86 mg/dL (ref 0.0–149.0)
VLDL: 17.2 mg/dL (ref 0.0–40.0)

## 2016-10-04 LAB — COMPREHENSIVE METABOLIC PANEL
ALT: 18 U/L (ref 0–53)
AST: 19 U/L (ref 0–37)
Albumin: 4.4 g/dL (ref 3.5–5.2)
Alkaline Phosphatase: 55 U/L (ref 39–117)
BUN: 15 mg/dL (ref 6–23)
CALCIUM: 9.6 mg/dL (ref 8.4–10.5)
CHLORIDE: 102 meq/L (ref 96–112)
CO2: 31 meq/L (ref 19–32)
Creatinine, Ser: 1.07 mg/dL (ref 0.40–1.50)
GFR: 71.51 mL/min (ref >60.00)
GLUCOSE: 83 mg/dL (ref 70–99)
POTASSIUM: 3.7 meq/L (ref 3.5–5.1)
Sodium: 140 mEq/L (ref 135–145)
Total Bilirubin: 0.7 mg/dL (ref 0.2–1.2)
Total Protein: 7 g/dL (ref 6.0–8.3)

## 2016-10-04 LAB — TSH: TSH: 1.11 u[IU]/mL (ref 0.35–4.50)

## 2016-10-04 NOTE — Assessment & Plan Note (Signed)
Well controlled, no changes to meds. Encouraged heart healthy diet such as the DASH diet and exercise as tolerated.  °

## 2016-10-04 NOTE — Assessment & Plan Note (Signed)
Check CBC today.  

## 2016-10-04 NOTE — Progress Notes (Signed)
Pre visit review using our clinic review tool, if applicable. No additional management support is needed unless otherwise documented below in the visit note. 

## 2016-10-04 NOTE — Patient Instructions (Addendum)
Use Lidocaine patches (i.e. Aspercreme, Icy Hot).  Please bring in a copy of Advanced Directives to be placed into the [facility]. Preventive Care 49 Years and Older, Male Preventive care refers to lifestyle choices and visits with your health care provider that can promote health and wellness. What does preventive care include?  A yearly physical exam. This is also called an annual well check.  Dental exams once or twice a year.  Routine eye exams. Ask your health care provider how often you should have your eyes checked.  Personal lifestyle choices, including:  Daily care of your teeth and gums.  Regular physical activity.  Eating a healthy diet.  Avoiding tobacco and drug use.  Limiting alcohol use.  Practicing safe sex.  Taking low doses of aspirin every day.  Taking vitamin and mineral supplements as recommended by your health care provider. What happens during an annual well check? The services and screenings done by your health care provider during your annual well check will depend on your age, overall health, lifestyle risk factors, and family history of disease. Counseling  Your health care provider may ask you questions about your:  Alcohol use.  Tobacco use.  Drug use.  Emotional well-being.  Home and relationship well-being.  Sexual activity.  Eating habits.  History of falls.  Memory and ability to understand (cognition).  Work and work Statistician. Screening  You may have the following tests or measurements:  Height, weight, and BMI.  Blood pressure.  Lipid and cholesterol levels. These may be checked every 5 years, or more frequently if you are over 72 years old.  Skin check.  Lung cancer screening. You may have this screening every year starting at age 76 if you have a 30-pack-year history of smoking and currently smoke or have quit within the past 15 years.  Fecal occult  blood test (FOBT) of the stool. You may have this test every year starting at age 31.  Flexible sigmoidoscopy or colonoscopy. You may have a sigmoidoscopy every 5 years or a colonoscopy every 10 years starting at age 28.  Prostate cancer screening. Recommendations will vary depending on your family history and other risks.  Hepatitis C blood test.  Hepatitis B blood test.  Sexually transmitted disease (STD) testing.  Diabetes screening. This is done by checking your blood sugar (glucose) after you have not eaten for a while (fasting). You may have this done every 1-3 years.  Abdominal aortic aneurysm (AAA) screening. You may need this if you are a current or former smoker.  Osteoporosis. You may be screened starting at age 54 if you are at high risk. Talk with your health care provider about your test results, treatment options, and if necessary, the need for more tests. Vaccines  Your health care provider may recommend certain vaccines, such as:  Influenza vaccine. This is recommended every year.  Tetanus, diphtheria, and acellular pertussis (Tdap, Td) vaccine. You may need a Td booster every 10 years.  Varicella vaccine. You may need this if you have not been vaccinated.  Zoster vaccine. You may need this after age 11.  Measles, mumps, and rubella (MMR) vaccine. You may need at least one dose of MMR if you were born in 1957 or later. You may also need a second dose.  Pneumococcal 13-valent conjugate (PCV13) vaccine. One dose is recommended after age 40.  Pneumococcal polysaccharide (PPSV23) vaccine. One dose is recommended after age 35.  Meningococcal vaccine. You may need this if you have  certain conditions.  Hepatitis A vaccine. You may need this if you have certain conditions or if you travel or work in places where you may be exposed to hepatitis A.  Hepatitis B vaccine. You may need this if you have certain conditions or if you travel or work in places where you may be  exposed to hepatitis B.  Haemophilus influenzae type b (Hib) vaccine. You may need this if you have certain risk factors. Talk to your health care provider about which screenings and vaccines you need and how often you need them. This information is not intended to replace advice given to you by your health care provider. Make sure you discuss any questions you have with your health care provider. Document Released: 10/20/2015 Document Revised: 06/12/2016 Document Reviewed: 07/25/2015 Elsevier Interactive Patient Education  2017 [employer].

## 2016-10-04 NOTE — Assessment & Plan Note (Signed)
Tolerating statin, encouraged heart healthy diet, avoid trans fats, minimize simple carbs and saturated fats. Increase exercise as tolerated 

## 2016-10-04 NOTE — Progress Notes (Signed)
Patient ID: Walter Brown, male   DOB: 10/21/1940, 75 y.o.   MRN: [id number]

## 2016-10-04 NOTE — Progress Notes (Signed)
Subjective:    Patient ID: Walter Brown, male    DOB: 06-29-1941, 75 y.o.   MRN: [id number]  Chief Complaint  Patient presents with  . Annual Exam    annual and pain over right flank    HPI Patient is in today for annual examination. Patient complains of some aching and soreness around his right kidney area for 2 weeks. Does not note significant change in pain level with position changes. Denies any recent illness, hospitalization or other concerns. Denies CP/palp/SOB/HA/congestion/fevers/GI or GU c/o. Taking meds as prescribed  Past Medical History:  Diagnosis Date  . Anemia    mild  . Asthma    childhood  . BPH (benign prostatic hyperplasia)   . Cancer ([place])    skin- on nose- basal cell (20 yrs ago) forehead 1 year ago  . Cerumen impaction 08/10/2012  . Chicken pox as child  . Depression 08/01/2012  . [place] measles as a child  . Hyperlipidemia   . Hypertension   . Macular degeneration   . Medicare annual wellness visit, subsequent 11/07/2013  . Otitis externa 08/10/2012  . Preventative health care 08/01/2012  . Right shoulder pain 09/26/2014  . Thrombocytopenia ([place])     Past Surgical History:  Procedure Laterality Date  . [place]     very young, b/l  . HYDROCELE EXCISION / REPAIR  2012   b/l  . SKIN CANCER EXCISION     nose and forehead    Family History  Problem Relation Age of Onset  . Hypertension Mother   . Other Mother     stents  . Heart disease Mother   . Parkinsonism Father   . Heart disease Father   . Cancer Father 64    prostate  . Hypertension Father   . Cancer Maternal Grandfather     prostate ?  . ADD / ADHD Son     ADHD  . Other Son 24    part of 1 lung removed- due to infection  . Stroke Maternal Grandmother   . Cancer Paternal Grandfather     Social History   Social History  . Marital status: Married    Spouse name: N/A  . Number of children: N/A  . Years of education: N/A   Occupational History  . Not on  file.   Social History Main Topics  . Smoking status: Former Smoker    Packs/day: 1.50    Years: 15.00    Start date: 10/07/1978  . Smokeless tobacco: Never Used  . Alcohol use Yes     Comment: 2 glasses of wine daily  . Drug use: No  . 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   Other Topics Concern  . Not on file   Social History Narrative  . No narrative on file    Outpatient Medications Prior to Visit  Medication Sig Dispense Refill  . aspirin 81 MG tablet Take 81 mg by mouth daily.    . hydrochlorothiazide (HYDRODIURIL) 25 MG tablet TAKE ONE TABLET BY MOUTH ONCE DAILY 90 tablet 1  . Multiple Vitamins-Minerals (MULTIVITAMIN MEN 50+ PO) Take 1 tablet by mouth daily.    . simvastatin (ZOCOR) 20 MG tablet Take 0.5 tablets (10 mg total) by mouth every other day. 90 tablet 1  . simvastatin (ZOCOR) 20 MG tablet TAKE ONE TABLET BY MOUTH IN THE EVENING 90 tablet 0  .  terazosin (HYTRIN) 5 MG capsule Take 5 mg by mouth daily.     No facility-administered medications prior to visit.     No Known Allergies  Review of Systems  Constitutional: Negative for fever.  Eyes: Negative for blurred vision.  Respiratory: Negative for cough and shortness of breath.   Cardiovascular: Negative for chest pain.  Gastrointestinal: Negative for vomiting.  Musculoskeletal: Positive for back pain.  Skin: Negative for rash.  Neurological: Negative for loss of consciousness and headaches.       Objective:    Physical Exam  Constitutional: He is oriented to person, place, and time. He appears well-developed and well-nourished. No distress.  HENT:  Head: Normocephalic and atraumatic.  Eyes: Conjunctivae are normal.  Neck: Normal range of motion. No thyromegaly present.  Cardiovascular: Normal rate and regular rhythm.   Pulmonary/Chest: Effort normal and breath sounds normal. He has no wheezes.    Abdominal: Soft. Bowel sounds are normal. There is no tenderness.  Musculoskeletal: He exhibits no edema or deformity.  Neurological: He is alert and oriented to person, place, and time.  Skin: Skin is warm and dry. He is not diaphoretic.  Psychiatric: He has a normal mood and affect.    BP 118/66 (BP Location: Left Arm, Patient Position: Sitting, Cuff Size: Normal)   Temp 98.6 F (37 C) (Oral)   Ht 5\' 6"  (1.676 m)   Wt 175 lb 3.2 oz (79.5 kg)   SpO2 96% Comment: RA  BMI 28.28 kg/m  Wt Readings from Last 3 Encounters:  10/04/16 175 lb 3.2 oz (79.5 kg)  04/04/16 183 lb 8 oz (83.2 kg)  03/05/16 184 lb (83.5 kg)     Lab Results  Component Value Date   WBC 7.0 03/29/2016   HGB 14.6 03/29/2016   HCT 43.8 03/29/2016   PLT 140.0 (L) 03/29/2016   GLUCOSE 80 03/29/2016   CHOL 150 03/29/2016   TRIG 93.0 03/29/2016   HDL 63.40 03/29/2016   LDLCALC 68 03/29/2016   ALT 16 03/29/2016   AST 20 03/29/2016   NA 138 03/29/2016   K 4.1 03/29/2016   CL 103 03/29/2016   CREATININE 1.09 03/29/2016   BUN 14 03/29/2016   CO2 32 03/29/2016   TSH 1.10 03/29/2016    Lab Results  Component Value Date   TSH 1.10 03/29/2016   Lab Results  Component Value Date   WBC 7.0 03/29/2016   HGB 14.6 03/29/2016   HCT 43.8 03/29/2016   MCV 92.8 03/29/2016   PLT 140.0 (L) 03/29/2016   Lab Results  Component Value Date   NA 138 03/29/2016   K 4.1 03/29/2016   CO2 32 03/29/2016   GLUCOSE 80 03/29/2016   BUN 14 03/29/2016   CREATININE 1.09 03/29/2016   BILITOT 0.8 03/29/2016   ALKPHOS 48 03/29/2016   AST 20 03/29/2016   ALT 16 03/29/2016   PROT 6.6 03/29/2016   ALBUMIN 4.2 03/29/2016   CALCIUM 9.4 03/29/2016   GFR 70.10 03/29/2016   Lab Results  Component Value Date   CHOL 150 03/29/2016   Lab Results  Component Value Date   HDL 63.40 03/29/2016   Lab Results  Component Value Date   LDLCALC 68 03/29/2016   Lab Results  Component Value Date   TRIG 93.0 03/29/2016   Lab  Results  Component Value Date   CHOLHDL 2 03/29/2016   No results found for: HGBA1C    I acted as a Education administrator for Dr. Charlett Blake. Raiford Noble, [place]  Plan:   Problem List Items Addressed This Visit    Cancer [facility])    Now with Dr Renda Rolls, they just treated a precancerous lesion on his scalp      Thrombocytopenia ([place])    Check CBC today      Hyperlipidemia    Tolerating statin, encouraged heart healthy diet, avoid trans fats, minimize simple carbs and saturated fats. Increase exercise as tolerated      Relevant Orders   TSH   Hypertension    Well controlled, no changes to meds. Encouraged heart healthy diet such as the DASH diet and exercise as tolerated.       Relevant Orders   CBC   Comprehensive metabolic panel   Lipid panel   Preventative health care    Patient encouraged to maintain heart healthy diet, regular exercise, adequate sleep. Consider daily probiotics. Take medications as prescribed. Patient has advanced directives but not in chart. He agrees to bring [place] in copies.       Flank pain   Relevant Orders   Urinalysis with Culture, if indicated    Other Visit Diagnoses    Urinary frequency    -  Primary   Relevant Orders   Urinalysis with Culture, if indicated   Back pain, unspecified back location, unspecified back pain laterality, unspecified chronicity       Relevant Orders   DG Lumbar Spine 2-3 Views (Completed)      I am having Mr. Blanchett maintain his terazosin, aspirin, Multiple Vitamins-Minerals (MULTIVITAMIN MEN 50+ PO), simvastatin, hydrochlorothiazide, and simvastatin.  No orders of the defined types were placed in this encounter.   CMA functioned as Education administrator during this visit. History, Physical and Plan perfomed by MD Penni Homans, MD

## 2016-10-04 NOTE — Assessment & Plan Note (Signed)
Patient encouraged to maintain heart healthy diet, regular exercise, adequate sleep. Consider daily probiotics. Take medications as prescribed. Patient has advanced directives but not in chart. He agrees to bring [place] in copies.

## 2016-10-04 NOTE — Assessment & Plan Note (Signed)
Now with Dr Renda Rolls, they just treated a precancerous lesion on his scalp

## 2016-10-08 DIAGNOSIS — N529 Male erectile dysfunction, unspecified: Secondary | ICD-10-CM | POA: Diagnosis not present

## 2016-10-14 DIAGNOSIS — N401 Enlarged prostate with lower urinary tract symptoms: Secondary | ICD-10-CM | POA: Diagnosis not present

## 2016-10-14 DIAGNOSIS — Z125 Encounter for screening for malignant neoplasm of prostate: Secondary | ICD-10-CM | POA: Diagnosis not present

## 2016-10-14 DIAGNOSIS — R351 Nocturia: Secondary | ICD-10-CM | POA: Diagnosis not present

## 2016-11-05 DIAGNOSIS — R69 Illness, unspecified: Secondary | ICD-10-CM | POA: Diagnosis not present

## 2016-11-08 DIAGNOSIS — Z6829 Body mass index (BMI) 29.0-29.9, adult: Secondary | ICD-10-CM | POA: Diagnosis not present

## 2016-11-08 DIAGNOSIS — N401 Enlarged prostate with lower urinary tract symptoms: Secondary | ICD-10-CM | POA: Diagnosis not present

## 2016-11-08 DIAGNOSIS — Z0000 Encounter for general adult medical examination without abnormal findings: Secondary | ICD-10-CM | POA: Diagnosis not present

## 2016-11-08 DIAGNOSIS — E7800 Pure hypercholesterolemia, unspecified: Secondary | ICD-10-CM | POA: Diagnosis not present

## 2016-11-08 DIAGNOSIS — I10 Essential (primary) hypertension: Secondary | ICD-10-CM | POA: Diagnosis not present

## 2016-11-08 DIAGNOSIS — H3530 Unspecified macular degeneration: Secondary | ICD-10-CM | POA: Diagnosis not present

## 2016-11-08 DIAGNOSIS — Z79899 Other long term (current) drug therapy: Secondary | ICD-10-CM | POA: Diagnosis not present

## 2016-11-08 DIAGNOSIS — Z87891 Personal history of nicotine dependence: Secondary | ICD-10-CM | POA: Diagnosis not present

## 2016-11-08 DIAGNOSIS — Z7982 Long term (current) use of aspirin: Secondary | ICD-10-CM | POA: Diagnosis not present

## 2016-11-11 ENCOUNTER — Other Ambulatory Visit: Payer: Self-pay | Admitting: Family Medicine

## 2016-12-07 ENCOUNTER — Other Ambulatory Visit: Payer: Self-pay | Admitting: Family Medicine

## 2016-12-09 ENCOUNTER — Other Ambulatory Visit: Payer: Self-pay

## 2016-12-09 MED ORDER — SIMVASTATIN 20 MG PO TABS: 20.0000 mg | ORAL_TABLET | Freq: Every evening | ORAL | 1 refills | Status: DC

## 2016-12-28 ENCOUNTER — Encounter: Payer: Self-pay | Admitting: Family Medicine

## 2016-12-30 ENCOUNTER — Other Ambulatory Visit: Payer: Self-pay | Admitting: Family Medicine

## 2016-12-30 MED ORDER — HYDROCHLOROTHIAZIDE 25 MG PO TABS: 25.0000 mg | ORAL_TABLET | Freq: Every day | ORAL | 1 refills | Status: DC

## 2016-12-31 DIAGNOSIS — H02403 Unspecified ptosis of bilateral eyelids: Secondary | ICD-10-CM | POA: Diagnosis not present

## 2016-12-31 DIAGNOSIS — H353122 Nonexudative age-related macular degeneration, left eye, intermediate dry stage: Secondary | ICD-10-CM | POA: Diagnosis not present

## 2016-12-31 DIAGNOSIS — H5203 Hypermetropia, bilateral: Secondary | ICD-10-CM | POA: Diagnosis not present

## 2016-12-31 DIAGNOSIS — H2513 Age-related nuclear cataract, bilateral: Secondary | ICD-10-CM | POA: Diagnosis not present

## 2017-01-13 DIAGNOSIS — H02413 Mechanical ptosis of bilateral eyelids: Secondary | ICD-10-CM | POA: Diagnosis not present

## 2017-01-13 DIAGNOSIS — H02834 Dermatochalasis of left upper eyelid: Secondary | ICD-10-CM | POA: Diagnosis not present

## 2017-01-13 DIAGNOSIS — H02831 Dermatochalasis of right upper eyelid: Secondary | ICD-10-CM | POA: Diagnosis not present

## 2017-01-13 DIAGNOSIS — H53483 Generalized contraction of visual field, bilateral: Secondary | ICD-10-CM | POA: Diagnosis not present

## 2017-01-13 DIAGNOSIS — H02423 Myogenic ptosis of bilateral eyelids: Secondary | ICD-10-CM | POA: Diagnosis not present

## 2017-01-27 ENCOUNTER — Other Ambulatory Visit: Payer: Self-pay | Admitting: Family Medicine

## 2017-03-06 DIAGNOSIS — H02831 Dermatochalasis of right upper eyelid: Secondary | ICD-10-CM | POA: Diagnosis not present

## 2017-03-06 DIAGNOSIS — H02834 Dermatochalasis of left upper eyelid: Secondary | ICD-10-CM | POA: Diagnosis not present

## 2017-03-06 DIAGNOSIS — H0279 Other degenerative disorders of eyelid and periocular area: Secondary | ICD-10-CM | POA: Diagnosis not present

## 2017-03-06 DIAGNOSIS — H02423 Myogenic ptosis of bilateral eyelids: Secondary | ICD-10-CM | POA: Diagnosis not present

## 2017-03-06 DIAGNOSIS — H02413 Mechanical ptosis of bilateral eyelids: Secondary | ICD-10-CM | POA: Diagnosis not present

## 2017-04-04 ENCOUNTER — Ambulatory Visit (INDEPENDENT_AMBULATORY_CARE_PROVIDER_SITE_OTHER): Payer: Medicare HMO | Admitting: Family Medicine

## 2017-04-04 VITALS — BP 126/72 | HR 58 | Temp 97.9°F | Resp 18 | Ht 66.0 in | Wt 183.0 lb

## 2017-04-04 DIAGNOSIS — D696 Thrombocytopenia, unspecified: Secondary | ICD-10-CM | POA: Diagnosis not present

## 2017-04-04 DIAGNOSIS — E785 Hyperlipidemia, unspecified: Secondary | ICD-10-CM

## 2017-04-04 DIAGNOSIS — I10 Essential (primary) hypertension: Secondary | ICD-10-CM

## 2017-04-04 DIAGNOSIS — E663 Overweight: Secondary | ICD-10-CM | POA: Diagnosis not present

## 2017-04-04 DIAGNOSIS — M1990 Unspecified osteoarthritis, unspecified site: Secondary | ICD-10-CM | POA: Diagnosis not present

## 2017-04-04 LAB — COMPREHENSIVE METABOLIC PANEL
ALT: 15 U/L (ref 0–53)
AST: 19 U/L (ref 0–37)
Albumin: 4.1 g/dL (ref 3.5–5.2)
Alkaline Phosphatase: 54 U/L (ref 39–117)
BILIRUBIN TOTAL: 0.7 mg/dL (ref 0.2–1.2)
BUN: 15 mg/dL (ref 6–23)
CALCIUM: 9.4 mg/dL (ref 8.4–10.5)
CO2: 32 mEq/L (ref 19–32)
CREATININE: 1.08 mg/dL (ref 0.40–1.50)
Chloride: 103 mEq/L (ref 96–112)
GFR: 70.66 mL/min (ref >60.00)
Glucose, Bld: 95 mg/dL (ref 70–99)
Potassium: 3.8 mEq/L (ref 3.5–5.1)
SODIUM: 139 meq/L (ref 135–145)
Total Protein: 6.9 g/dL (ref 6.0–8.3)

## 2017-04-04 LAB — LIPID PANEL
CHOL/HDL RATIO: 2
CHOLESTEROL: 154 mg/dL (ref 0–200)
HDL: 63.9 mg/dL (ref >39.00)
LDL Cholesterol: 66 mg/dL (ref 0–99)
NonHDL: 90.22
TRIGLYCERIDES: 121 mg/dL (ref 0.0–149.0)
VLDL: 24.2 mg/dL (ref 0.0–40.0)

## 2017-04-04 LAB — CBC
HCT: 42.5 % (ref 39.0–52.0)
Hemoglobin: 14.3 g/dL (ref 13.0–17.0)
MCHC: 33.8 g/dL (ref 30.0–36.0)
MCV: 89.9 fl (ref 78.0–100.0)
PLATELETS: 138 10*3/uL — AB (ref 150.0–400.0)
RBC: 4.72 Mil/uL (ref 4.22–5.81)
RDW: 14.7 % (ref 11.5–15.5)
WBC: 7.1 10*3/uL (ref 4.0–10.5)

## 2017-04-04 LAB — TSH: TSH: 1.92 u[IU]/mL (ref 0.35–4.50)

## 2017-04-04 NOTE — Assessment & Plan Note (Addendum)
Cbc today, asymptomatic

## 2017-04-04 NOTE — Progress Notes (Signed)
Subjective:  I acted as a Education administrator for Dr. Charlett Blake. [name], [place]  Patient ID: Walter Brown, male    DOB: July 06, 1941, 76 y.o.   MRN: [id number]  Chief Complaint  Patient presents with  . Follow-up    HPI  Patient is in today for a 6 month follow up. Patient has no acute conerns although he does endorse some aching in his knees. No injury, redness or swelling. No recent febrile illness or acute hospitalizations. He is eating well and staying active. Denies CP/palp/SOB/HA/congestion/fevers/GI or GU c/o. Taking meds as prescribed  Patient Care Team: [name], MD as PCP - General (Family Medicine) Renda Rolls, Jennefer Bravo, MD as Referring Physician (Dermatology) Luberta Mutter, MD as Consulting Physician (Ophthalmology) Ardis Hughs, MD as Consulting Physician (Urology)   Past Medical History:  Diagnosis Date  . Anemia    mild  . Arthritis 04/06/2017  . Asthma    childhood  . BPH (benign prostatic hyperplasia)   . Cancer ([place])    skin- on nose- basal cell (20 yrs ago) forehead 1 year ago  . Cerumen impaction 08/10/2012  . Chicken pox as child  . Depression 08/01/2012  . [place] measles as a child  . Hyperlipidemia   . Hypertension   . Macular degeneration   . Medicare annual wellness visit, subsequent 11/07/2013  . Otitis externa 08/10/2012  . Preventative health care 08/01/2012  . Right shoulder pain 09/26/2014  . Thrombocytopenia ([place])     Past Surgical History:  Procedure Laterality Date  . [place]     very young, b/l  . HYDROCELE EXCISION / REPAIR  2012   b/l  . SKIN CANCER EXCISION     nose and forehead    Family History  Problem Relation Age of Onset  . Hypertension Mother   . Other Mother        stents  . Heart disease Mother   . Parkinsonism Father   . Heart disease Father   . Cancer Father 8       prostate  . Hypertension Father   . Cancer Maternal Grandfather        prostate ?  . ADD / ADHD Son        ADHD  . Other Son  24       part of 1 lung removed- due to infection  . Stroke Maternal Grandmother   . Cancer Paternal Grandfather     Social History   Social History  . Marital status: Married    Spouse name: N/A  . Number of children: N/A  . Years of education: N/A   Occupational History  . Not on file.   Social History Main Topics  . Smoking status: Former Smoker    Packs/day: 1.50    Years: 15.00    Start date: 10/07/1978  . Smokeless tobacco: Never Used  . Alcohol use Yes     Comment: 2 glasses of wine daily  . Drug use: No  . 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   Other Topics Concern  . Not on file   Social History Narrative  . No narrative on file    Outpatient Medications Prior to Visit  Medication Sig Dispense Refill  . aspirin 81 MG tablet Take 81 mg by mouth daily.    . hydrochlorothiazide (HYDRODIURIL) 25 MG tablet Take 1 tablet (25 mg total) by  mouth daily. 90 tablet 1  . Multiple Vitamins-Minerals (MULTIVITAMIN MEN 50+ PO) Take 1 tablet by mouth daily.    . sertraline (ZOLOFT) 50 MG tablet TAKE 1 TABLET (50 MG TOTAL) BY MOUTH DAILY. 90 tablet 0  . simvastatin (ZOCOR) 20 MG tablet Take 1 tablet (20 mg total) by mouth every evening. 90 tablet 1  . terazosin (HYTRIN) 5 MG capsule Take 5 mg by mouth daily.     No facility-administered medications prior to visit.     No Known Allergies  Review of Systems  Constitutional: Negative for fever and malaise/fatigue.  HENT: Negative for congestion.   Eyes: Negative for blurred vision.  Respiratory: Negative for shortness of breath.   Cardiovascular: Negative for chest pain, palpitations and leg swelling.  Gastrointestinal: Negative for abdominal pain, blood in stool and nausea.  Genitourinary: Negative for dysuria and frequency.  Musculoskeletal: Negative for falls.  Skin: Negative for rash.  Neurological: Negative for  dizziness, loss of consciousness and headaches.  Endo/Heme/Allergies: Negative for environmental allergies.  Psychiatric/Behavioral: Negative for depression. The patient is not nervous/anxious.        Objective:    Physical Exam  Constitutional: He is oriented to person, place, and time. He appears well-developed and well-nourished. No distress.  HENT:  Head: Normocephalic and atraumatic.  Eyes: Conjunctivae are normal.  Neck: Neck supple. No thyromegaly present.  Cardiovascular: Normal rate, regular rhythm and normal heart sounds.   No murmur heard. Pulmonary/Chest: Effort normal and breath sounds normal. No respiratory distress. He has no wheezes.  Abdominal: Soft. Bowel sounds are normal. He exhibits no mass. There is no tenderness.  Musculoskeletal: He exhibits no edema.  Lymphadenopathy:    He has no cervical adenopathy.  Neurological: He is alert and oriented to person, place, and time.  Skin: Skin is warm and dry.  Psychiatric: He has a normal mood and affect. His behavior is normal.    BP 126/72 (BP Location: Left Arm, Patient Position: Sitting, Cuff Size: Normal)   Pulse (!) 58   Temp 97.9 F (36.6 C) (Oral)   Resp 18   Ht 5\' 6"  (1.676 m)   Wt 183 lb (83 kg)   SpO2 98%   BMI 29.54 kg/m  Wt Readings from Last 3 Encounters:  04/04/17 183 lb (83 kg)  10/04/16 175 lb 3.2 oz (79.5 kg)  04/04/16 183 lb 8 oz (83.2 kg)   BP Readings from Last 3 Encounters:  04/04/17 126/72  10/04/16 118/66  04/04/16 120/82     Immunization History  Administered Date(s) Administered  . Influenza Split 07/02/2012  . Influenza Whole 06/07/2013  . Influenza,inj,Quad PF,36+ Mos 06/21/2014  . Influenza-Unspecified 06/07/2016  . Pneumococcal Conjugate-13 11/04/2013  . Pneumococcal Polysaccharide-23 10/08/2007  . Tdap 11/04/2013  . Zoster 10/07/1998    Health Maintenance  Topic Date Due  . INFLUENZA VACCINE  05/07/2017  . COLONOSCOPY  10/08/2019  . TETANUS/TDAP  11/05/2023  .  PNA vac Low Risk Adult  Completed    Lab Results  Component Value Date   WBC 7.1 04/04/2017   HGB 14.3 04/04/2017   HCT 42.5 04/04/2017   PLT 138.0 (L) 04/04/2017   GLUCOSE 95 04/04/2017   CHOL 154 04/04/2017   TRIG 121.0 04/04/2017   HDL 63.90 04/04/2017   LDLCALC 66 04/04/2017   ALT 15 04/04/2017   AST 19 04/04/2017   NA 139 04/04/2017   K 3.8 04/04/2017   CL 103 04/04/2017   CREATININE 1.08 04/04/2017   BUN 15  04/04/2017   CO2 32 04/04/2017   TSH 1.92 04/04/2017    Lab Results  Component Value Date   TSH 1.92 04/04/2017   Lab Results  Component Value Date   WBC 7.1 04/04/2017   HGB 14.3 04/04/2017   HCT 42.5 04/04/2017   MCV 89.9 04/04/2017   PLT 138.0 (L) 04/04/2017   Lab Results  Component Value Date   NA 139 04/04/2017   K 3.8 04/04/2017   CO2 32 04/04/2017   GLUCOSE 95 04/04/2017   BUN 15 04/04/2017   CREATININE 1.08 04/04/2017   BILITOT 0.7 04/04/2017   ALKPHOS 54 04/04/2017   AST 19 04/04/2017   ALT 15 04/04/2017   PROT 6.9 04/04/2017   ALBUMIN 4.1 04/04/2017   CALCIUM 9.4 04/04/2017   GFR 70.66 04/04/2017   Lab Results  Component Value Date   CHOL 154 04/04/2017   Lab Results  Component Value Date   HDL 63.90 04/04/2017   Lab Results  Component Value Date   LDLCALC 66 04/04/2017   Lab Results  Component Value Date   TRIG 121.0 04/04/2017   Lab Results  Component Value Date   CHOLHDL 2 04/04/2017   No results found for: HGBA1C       Assessment & Plan:   Problem List Items Addressed This Visit    Thrombocytopenia ([place])    Cbc today, asymptomatic      Relevant Orders   CBC (Completed)   Hyperlipidemia    Tolerating statin, encouraged heart healthy diet, avoid trans fats, minimize simple carbs and saturated fats. Increase exercise as tolerated.       Relevant Orders   Lipid panel (Completed)   Hypertension - Primary    Well controlled, no changes to meds. Encouraged heart healthy diet such as the DASH diet and  exercise as tolerated.       Relevant Orders   CBC (Completed)   Comprehensive metabolic panel (Completed)   TSH (Completed)   Overweight    Encouraged DASH or MIND diet      Arthritis    Achy knees without recent injury. Encouraged to stay active and try topical treatments         I am having Mr. Nodal maintain his terazosin, aspirin, Multiple Vitamins-Minerals (MULTIVITAMIN MEN 50+ PO), simvastatin, hydrochlorothiazide, and sertraline.  No orders of the defined types were placed in this encounter.   CMA served as Education administrator during this visit. History, Physical and Plan performed by medical provider. Documentation and orders reviewed and attested to.  Penni Homans, MD

## 2017-04-04 NOTE — Assessment & Plan Note (Signed)
Well controlled, no changes to meds. Encouraged heart healthy diet such as the DASH diet and exercise as tolerated.  °

## 2017-04-04 NOTE — Assessment & Plan Note (Signed)
Tolerating statin, encouraged heart healthy diet, avoid trans fats, minimize simple carbs and saturated fats. Increase exercise as tolerated 

## 2017-04-04 NOTE — Assessment & Plan Note (Addendum)
Encouraged DASH or MIND diet

## 2017-04-04 NOTE — Patient Instructions (Addendum)
Glucosamine Chondrotin tabs, such as Cosamin DS. Can help knees in about 1/2 the people that take it. If you try it take it for 3 full months. Then at the end of the 3 months you think it is helping then keep taking it if not stop it. Lidocaine gel, Aspercreme, [employer] and Sal[place] Pas all make a Lidocaine gel  DASH or MIND diet   Shingrix is the new shingles shot can get it at pharmacy, 2 shots over 6 months Cholesterol Cholesterol is a white, waxy, fat-like substance that is needed by the human body in small amounts. The liver makes all the cholesterol we need. Cholesterol is carried from the liver by the blood through the blood vessels. Deposits of cholesterol (plaques) may build up on blood vessel (artery) walls. Plaques make the arteries narrower and stiffer. Cholesterol plaques increase the risk for heart attack and stroke. You cannot feel your cholesterol level even if it is very high. The only way to know that it is high is to have a blood test. Once you know your cholesterol levels, you should keep a record of the test results. Work with your health care provider to keep your levels in the desired range. What do the results mean?  Total cholesterol is a rough measure of all the cholesterol in your blood.  LDL (low-density lipoprotein) is the "bad" cholesterol. This is the type that causes plaque to build up on the artery walls. You want this level to be low.  HDL (high-density lipoprotein) is the "good" cholesterol because it cleans the arteries and carries the LDL away. You want this level to be high.  Triglycerides are fat that the body can either burn for energy or store. High levels are closely linked to heart disease. What are the desired levels of cholesterol?  Total cholesterol below 200.  LDL below 100 for people who are at risk, below 70 for people at very high risk.  HDL above 40 is good. A level of 60 or higher is considered to be protective against heart  disease.  Triglycerides below 150. How can I lower my cholesterol? Diet Follow your diet program as told by your health care provider.  Choose fish or white meat chicken and [place], roasted or baked. Limit fatty cuts of red meat, fried foods, and processed meats, such as sausage and lunch meats.  Eat lots of fresh fruits and vegetables.  Choose whole grains, beans, pasta, potatoes, and cereals.  Choose olive oil, corn oil, or canola oil, and use only small amounts.  Avoid butter, mayonnaise, shortening, or palm kernel oils.  Avoid foods with trans fats.  Drink skim or nonfat milk and eat low-fat or nonfat yogurt and cheeses. Avoid whole milk, cream, ice cream, egg yolks, and full-fat cheeses.  Healthier desserts include angel food cake, ginger snaps, animal crackers, hard candy, popsicles, and low-fat or nonfat frozen yogurt. Avoid pastries, cakes, pies, and cookies.  Exercise  Follow your exercise program as told by your health care provider. A regular program: ? Helps to decrease LDL and raise HDL. ? Helps with weight control.  Do things that increase your activity level, such as gardening, walking, and taking the stairs.  Ask your health care provider about ways that you can be more active in your daily life.  Medicine  Take over-the-counter and prescription medicines only as told by your health care provider. ? Medicine may be prescribed by your health care provider to help lower cholesterol and decrease the  risk for heart disease. This is usually done if diet and exercise have failed to bring down cholesterol levels. ? If you have several risk factors, you may need medicine even if your levels are normal.  This information is not intended to replace advice given to you by your health care provider. Make sure you discuss any questions you have with your health care provider. Document Released: 06/18/2001 Document Revised: 04/20/2016 Document Reviewed: 03/23/2016 Elsevier  Interactive Patient Education  2017 [employer].

## 2017-04-06 ENCOUNTER — Encounter: Payer: Self-pay | Admitting: Family Medicine

## 2017-04-06 DIAGNOSIS — M1990 Unspecified osteoarthritis, unspecified site: Secondary | ICD-10-CM

## 2017-04-06 HISTORY — DX: Unspecified osteoarthritis, unspecified site: M19.90

## 2017-04-06 NOTE — Assessment & Plan Note (Signed)
Achy knees without recent injury. Encouraged to stay active and try topical treatments

## 2017-04-25 ENCOUNTER — Other Ambulatory Visit: Payer: Self-pay | Admitting: Family Medicine

## 2017-05-09 ENCOUNTER — Other Ambulatory Visit: Payer: Self-pay | Admitting: Family Medicine

## 2017-05-09 MED ORDER — SERTRALINE HCL 50 MG PO TABS: 50.0000 mg | ORAL_TABLET | Freq: Every day | ORAL | 0 refills | Status: DC

## 2017-05-16 DIAGNOSIS — K645 Perianal venous thrombosis: Secondary | ICD-10-CM | POA: Diagnosis not present

## 2017-05-16 DIAGNOSIS — R030 Elevated blood-pressure reading, without diagnosis of hypertension: Secondary | ICD-10-CM | POA: Diagnosis not present

## 2017-06-25 ENCOUNTER — Other Ambulatory Visit: Payer: Self-pay | Admitting: Family Medicine

## 2017-06-26 ENCOUNTER — Other Ambulatory Visit: Payer: Self-pay | Admitting: Family Medicine

## 2017-07-08 DIAGNOSIS — R69 Illness, unspecified: Secondary | ICD-10-CM | POA: Diagnosis not present

## 2017-07-21 DIAGNOSIS — L570 Actinic keratosis: Secondary | ICD-10-CM | POA: Diagnosis not present

## 2017-07-21 DIAGNOSIS — L814 Other melanin hyperpigmentation: Secondary | ICD-10-CM | POA: Diagnosis not present

## 2017-07-21 DIAGNOSIS — D1801 Hemangioma of skin and subcutaneous tissue: Secondary | ICD-10-CM | POA: Diagnosis not present

## 2017-07-21 DIAGNOSIS — L821 Other seborrheic keratosis: Secondary | ICD-10-CM | POA: Diagnosis not present

## 2017-07-21 DIAGNOSIS — L738 Other specified follicular disorders: Secondary | ICD-10-CM | POA: Diagnosis not present

## 2017-07-21 DIAGNOSIS — D225 Melanocytic nevi of trunk: Secondary | ICD-10-CM | POA: Diagnosis not present

## 2017-07-21 DIAGNOSIS — D485 Neoplasm of uncertain behavior of skin: Secondary | ICD-10-CM | POA: Diagnosis not present

## 2017-07-21 DIAGNOSIS — Z23 Encounter for immunization: Secondary | ICD-10-CM | POA: Diagnosis not present

## 2017-07-21 DIAGNOSIS — Z86018 Personal history of other benign neoplasm: Secondary | ICD-10-CM | POA: Diagnosis not present

## 2017-07-21 DIAGNOSIS — Z85828 Personal history of other malignant neoplasm of skin: Secondary | ICD-10-CM | POA: Diagnosis not present

## 2017-08-04 ENCOUNTER — Other Ambulatory Visit: Payer: Self-pay | Admitting: Family Medicine

## 2017-09-16 DIAGNOSIS — K641 Second degree hemorrhoids: Secondary | ICD-10-CM | POA: Diagnosis not present

## 2017-09-16 DIAGNOSIS — I10 Essential (primary) hypertension: Secondary | ICD-10-CM | POA: Insufficient documentation

## 2017-09-17 DIAGNOSIS — M9904 Segmental and somatic dysfunction of sacral region: Secondary | ICD-10-CM | POA: Diagnosis not present

## 2017-09-17 DIAGNOSIS — M9902 Segmental and somatic dysfunction of thoracic region: Secondary | ICD-10-CM | POA: Diagnosis not present

## 2017-09-17 DIAGNOSIS — M5136 Other intervertebral disc degeneration, lumbar region: Secondary | ICD-10-CM | POA: Diagnosis not present

## 2017-09-17 DIAGNOSIS — M9903 Segmental and somatic dysfunction of lumbar region: Secondary | ICD-10-CM | POA: Diagnosis not present

## 2017-09-17 DIAGNOSIS — M9905 Segmental and somatic dysfunction of pelvic region: Secondary | ICD-10-CM | POA: Diagnosis not present

## 2017-09-19 DIAGNOSIS — M5136 Other intervertebral disc degeneration, lumbar region: Secondary | ICD-10-CM | POA: Diagnosis not present

## 2017-09-19 DIAGNOSIS — M9905 Segmental and somatic dysfunction of pelvic region: Secondary | ICD-10-CM | POA: Diagnosis not present

## 2017-09-19 DIAGNOSIS — M9904 Segmental and somatic dysfunction of sacral region: Secondary | ICD-10-CM | POA: Diagnosis not present

## 2017-09-19 DIAGNOSIS — M9903 Segmental and somatic dysfunction of lumbar region: Secondary | ICD-10-CM | POA: Diagnosis not present

## 2017-09-19 DIAGNOSIS — M9902 Segmental and somatic dysfunction of thoracic region: Secondary | ICD-10-CM | POA: Diagnosis not present

## 2017-09-21 ENCOUNTER — Other Ambulatory Visit: Payer: Self-pay | Admitting: Family Medicine

## 2017-10-01 ENCOUNTER — Other Ambulatory Visit: Payer: Self-pay | Admitting: Family Medicine

## 2017-10-02 ENCOUNTER — Ambulatory Visit: Payer: Self-pay | Admitting: Family Medicine

## 2017-10-02 NOTE — Progress Notes (Deleted)
Subjective:   Walter Brown is a 76 y.o. male who presents for Medicare Annual/Subsequent preventive examination.  Review of Systems:  No ROS.  Medicare Wellness Visit. Additional risk factors are reflected in the social history.   Sleep patterns: Home Safety/Smoke Alarms: Feels safe in home. Smoke alarms in place.   Male:   CCS-  Last 07/05/09-normal. Recall 10 yrs.  PSA- No results found for: PSA Objective:    Vitals: There were no vitals taken for this visit.  There is no height or weight on file to calculate BMI.  Advanced Directives 03/05/2016 03/03/2015  Does Patient Have a Medical Advance Directive? Yes Yes  Type of Paramedic of [place];Living will [place];Living will  Does patient want to make changes to medical advance directive? No - Patient declined Yes - information given  Copy of [place] in Chart? Yes No - copy requested    Tobacco Social History   Tobacco Use  Smoking Status Former Smoker  . Packs/day: 1.50  . Years: 15.00  . Pack years: 22.50  . Start date: 10/07/1978  Smokeless Tobacco Never Used     Counseling given: Not Answered   Clinical Intake:                       Past Medical History:  Diagnosis Date  . Anemia    mild  . Arthritis 04/06/2017  . Asthma    childhood  . BPH (benign prostatic hyperplasia)   . Cancer ([place])    skin- on nose- basal cell (20 yrs ago) forehead 1 year ago  . Cerumen impaction 08/10/2012  . Chicken pox as child  . Depression 08/01/2012  . [place] measles as a child  . Hyperlipidemia   . Hypertension   . Macular degeneration   . Medicare annual wellness visit, subsequent 11/07/2013  . Otitis externa 08/10/2012  . Preventative health care 08/01/2012  . Right shoulder pain 09/26/2014  . Thrombocytopenia ([place])    Past Surgical History:  Procedure Laterality Date  . [place]     very young, b/l  . HYDROCELE EXCISION /  REPAIR  2012   b/l  . SKIN CANCER EXCISION     nose and forehead   Family History  Problem Relation Age of Onset  . Hypertension Mother   . Other Mother        stents  . Heart disease Mother   . Parkinsonism Father   . Heart disease Father   . Cancer Father 108       prostate  . Hypertension Father   . Cancer Maternal Grandfather        prostate ?  . ADD / ADHD Son        ADHD  . Other Son 24       part of 1 lung removed- due to infection  . Stroke Maternal Grandmother   . Cancer Paternal Grandfather    Social History   Socioeconomic History  . Marital status: Married    Spouse name: Not on file  . Number of children: Not on file  . Years of education: Not on file  . Highest education level: Not on file  Social Needs  . Financial resource strain: Not on file  . Food insecurity - worry: Not on file  . Food insecurity - inability: Not on file  . Transportation needs - medical: Not on file  . Transportation needs -  non-medical: Not on file  Occupational History  . Not on file  Tobacco Use  . Smoking status: Former Smoker    Packs/day: 1.50    Years: 15.00    Pack years: 22.50    Start date: 10/07/1978  . Smokeless tobacco: Never Used  Substance and Sexual Activity  . Alcohol use: Yes    Comment: 2 glasses of wine daily  . Drug use: No  . 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  Other Topics Concern  . Not on file  Social History Narrative  . Not on file    Outpatient Encounter Medications as of 10/03/2017  Medication Sig  . aspirin 81 MG tablet Take 81 mg by mouth daily.  . hydrochlorothiazide (HYDRODIURIL) 25 MG tablet TAKE 1 TABLET BY MOUTH EVERY DAY  . Multiple Vitamins-Minerals (MULTIVITAMIN MEN 50+ PO) Take 1 tablet by mouth daily.  . sertraline (ZOLOFT) 50 MG tablet TAKE 1 TABLET BY MOUTH EVERY DAY  . simvastatin (ZOCOR) 20 MG tablet TAKE 1 TABLET (20 MG  TOTAL) BY MOUTH EVERY EVENING.  Marland Kitchen terazosin (HYTRIN) 5 MG capsule Take 5 mg by mouth daily.   No facility-administered encounter medications on file as of 10/03/2017.     Activities of Daily Living No flowsheet data found.  Patient Care Team: [name], MD as PCP - General (Family Medicine) Renda Rolls, Jennefer Bravo, MD as Referring Physician (Dermatology) Luberta Mutter, MD as Consulting Physician (Ophthalmology) Ardis Hughs, MD as Consulting Physician (Urology)   Assessment:   This is a routine wellness examination for [place]. Physical assessment deferred to PCP.  Exercise Activities and Dietary recommendations   Diet (meal preparation, eat out, water intake, caffeinated beverages, dairy products, fruits and vegetables): {Desc; diets:16563} Breakfast: Lunch:  Dinner:      Goals    None      Fall Risk Fall Risk  03/05/2016 03/03/2015 11/04/2013  Falls in the past year? No No No    Depression Screen PHQ 2/9 Scores 03/05/2016 03/03/2015 11/04/2013  PHQ - 2 Score 1 0 1    Cognitive Function MMSE - Mini Mental State Exam 03/05/2016  Orientation to time 5  Orientation to Place 5  Registration 3  Attention/ Calculation 5  Recall 3  Language- name 2 objects 2  Language- repeat 1  Language- follow 3 step command 3  Language- read & follow direction 1  Write a sentence 1  Copy design 1  Total score 30        Immunization History  Administered Date(s) Administered  . Influenza Split 07/02/2012  . Influenza Whole 06/07/2013  . Influenza,inj,Quad PF,6+ Mos 06/21/2014  . Influenza-Unspecified 06/07/2016, 07/08/2017  . Pneumococcal Conjugate-13 11/04/2013  . Pneumococcal Polysaccharide-23 10/08/2007  . Tdap 11/04/2013  . Zoster 10/07/1998   Screening Tests Health Maintenance  Topic Date Due  . TETANUS/TDAP  11/05/2023  . INFLUENZA VACCINE  Completed  . PNA vac Low Risk Adult  Completed   Plan:   ***  I have personally reviewed and noted the  following in the patient's chart:   . Medical and social history . Use of alcohol, tobacco or illicit drugs  . Current medications and supplements . Functional ability and status . Nutritional status . Physical activity . Advanced directives . List of other physicians . Hospitalizations, surgeries, and ER visits in previous 12 months . Vitals . Screenings to include cognitive, depression, and falls .  Referrals and appointments  In addition, I have reviewed and discussed with patient certain preventive protocols, quality metrics, and best practice recommendations. A written personalized care plan for preventive services as well as general preventive health recommendations were provided to patient.     Shela Nevin, [place]  10/02/2017

## 2017-10-03 ENCOUNTER — Encounter: Payer: Self-pay | Admitting: *Deleted

## 2017-10-03 ENCOUNTER — Ambulatory Visit (INDEPENDENT_AMBULATORY_CARE_PROVIDER_SITE_OTHER): Payer: Medicare HMO | Admitting: *Deleted

## 2017-10-03 ENCOUNTER — Ambulatory Visit: Payer: Self-pay | Admitting: Family Medicine

## 2017-10-03 VITALS — BP 124/78 | HR 59 | Ht 66.0 in | Wt 186.2 lb

## 2017-10-03 DIAGNOSIS — Z0000 Encounter for general adult medical examination without abnormal findings: Secondary | ICD-10-CM | POA: Diagnosis not present

## 2017-10-03 NOTE — Progress Notes (Signed)
Noted. Agree with above.  Jj Enyeart Paul Syvanna Ciolino, DO 10/03/17 12:14 PM   

## 2017-10-03 NOTE — Patient Instructions (Signed)
Follow up with Dr.Blyth as scheduled.  Continue to eat heart healthy diet (full of fruits, vegetables, whole grains, lean protein, water--limit salt, fat, and sugar intake) and increase physical activity as tolerated.  Continue doing brain stimulating activities (puzzles, reading, adult coloring books, staying active) to keep memory sharp.   Bring a copy of your living will and/or healthcare power of attorney to your next office visit.   Walter Brown , Thank you for taking time to come for your Medicare Wellness Visit. I appreciate your ongoing commitment to your health goals. Please review the following plan we discussed and let me know if I can assist you in the future.   These are the goals we discussed: Goals    . Drink 6-8 glasses of water per day.    . Weight (lb) < 170 lb (77.1 kg) (pt-stated)     By continuing to exercise and eating a low carb diet.       This is a list of the screening recommended for you and due dates:  Health Maintenance  Topic Date Due  . Tetanus Vaccine  11/05/2023  . Flu Shot  Completed  . Pneumonia vaccines  Completed    Health Maintenance, Male A healthy lifestyle and preventive care is important for your health and wellness. Ask your health care provider about what schedule of regular examinations is right for you. What should I know about weight and diet? Eat a Healthy Diet  Eat plenty of vegetables, fruits, whole grains, low-fat dairy products, and lean protein.  Do not eat a lot of foods high in solid fats, added sugars, or salt.  Maintain a Healthy Weight Regular exercise can help you achieve or maintain a healthy weight. You should:  Do at least 150 minutes of exercise each week. The exercise should increase your heart rate and make you sweat (moderate-intensity exercise).  Do strength-training exercises at least twice a week.  Watch Your Levels of Cholesterol and Blood Lipids  Have your blood tested for lipids and cholesterol every 5  years starting at 77 years of age. If you are at high risk for heart disease, you should start having your blood tested when you are 76 years old. You may need to have your cholesterol levels checked more often if: ? Your lipid or cholesterol levels are high. ? You are older than 76 years of age. ? You are at high risk for heart disease.  What should I know about cancer screening? Many types of cancers can be detected early and may often be prevented. Lung Cancer  You should be screened every year for lung cancer if: ? You are a current smoker who has smoked for at least 30 years. ? You are a former smoker who has quit within the past 15 years.  Talk to your health care provider about your screening options, when you should start screening, and how often you should be screened.  Colorectal Cancer  Routine colorectal cancer screening usually begins at 76 years of age and should be repeated every 5-10 years until you are 76 years old. You may need to be screened more often if early forms of precancerous polyps or small growths are found. Your health care provider may recommend screening at an earlier age if you have risk factors for colon cancer.  Your health care provider may recommend using home test kits to check for hidden blood in the stool.  A small camera at the end of a tube can be  used to examine your colon (sigmoidoscopy or colonoscopy). This checks for the earliest forms of colorectal cancer.  Prostate and Testicular Cancer  Depending on your age and overall health, your health care provider may do certain tests to screen for prostate and testicular cancer.  Talk to your health care provider about any symptoms or concerns you have about testicular or prostate cancer.  Skin Cancer  Check your skin from head to toe regularly.  Tell your health care provider about any new moles or changes in moles, especially if: ? There is a change in a mole's size, shape, or color. ? You  have a mole that is larger than a pencil eraser.  Always use sunscreen. Apply sunscreen liberally and repeat throughout the day.  Protect yourself by wearing long sleeves, pants, a wide-brimmed hat, and sunglasses when outside.  What should I know about heart disease, diabetes, and high blood pressure?  If you are 8-67 years of age, have your blood pressure checked every 3-5 years. If you are 73 years of age or older, have your blood pressure checked every year. You should have your blood pressure measured twice-once when you are at a hospital or clinic, and once when you are not at a hospital or clinic. Record the average of the two measurements. To check your blood pressure when you are not at a hospital or clinic, you can use: ? An automated blood pressure machine at a pharmacy. ? A home blood pressure monitor.  Talk to your health care provider about your target blood pressure.  If you are between 73-101 years old, ask your health care provider if you should take aspirin to prevent heart disease.  Have regular diabetes screenings by checking your fasting blood sugar level. ? If you are at a normal weight and have a low risk for diabetes, have this test once every three years after the age of 53. ? If you are overweight and have a high risk for diabetes, consider being tested at a younger age or more often.  A one-time screening for abdominal aortic aneurysm (AAA) by ultrasound is recommended for men aged 30-75 years who are current or former smokers. What should I know about preventing infection? Hepatitis B If you have a higher risk for hepatitis B, you should be screened for this virus. Talk with your health care provider to find out if you are at risk for hepatitis B infection. Hepatitis C Blood testing is recommended for:  Everyone born from [birth year] through 1965.  Anyone with known risk factors for hepatitis C.  Sexually Transmitted Diseases (STDs)  You should be screened each  year for STDs including gonorrhea and chlamydia if: ? You are sexually active and are younger than 76 years of age. ? You are older than 76 years of age and your health care provider tells you that you are at risk for this type of infection. ? Your sexual activity has changed since you were last screened and you are at an increased risk for chlamydia or gonorrhea. Ask your health care provider if you are at risk.  Talk with your health care provider about whether you are at high risk of being infected with HIV. Your health care provider may recommend a prescription medicine to help prevent HIV infection.  What else can I do?  Schedule regular health, dental, and eye exams.  Stay current with your vaccines (immunizations).  Do not use any tobacco products, such as cigarettes, chewing tobacco, and e-cigarettes. If  you need help quitting, ask your health care provider.  Limit alcohol intake to no more than 2 drinks per day. One drink equals 12 ounces of beer, 5 ounces of wine, or 1 ounces of hard liquor.  Do not use street drugs.  Do not share needles.  Ask your health care provider for help if you need support or information about quitting drugs.  Tell your health care provider if you often feel depressed.  Tell your health care provider if you have ever been abused or do not feel safe at home. This information is not intended to replace advice given to you by your health care provider. Make sure you discuss any questions you have with your health care provider. Document Released: 03/21/2008 Document Revised: 05/22/2016 Document Reviewed: 06/27/2015 Elsevier Interactive Patient Education  [employer].

## 2017-10-03 NOTE — Progress Notes (Addendum)
Subjective:   Walter Brown is a 76 y.o. male who presents for Medicare Annual/Subsequent preventive examination.  Review of Systems:  No ROS.  Medicare Wellness Visit. Additional risk factors are reflected in the social history. Cardiac Risk Factors include: advanced age (>8men, >64 women);hypertension;male gender;dyslipidemia Sleep patterns: Sleeps 5-6 hrs. Feels rested. Naps daily. Home Safety/Smoke Alarms: Feels safe in home. Smoke alarms in place. Lives with wife.   Male:   CCS- last reported 10/07/09. Normal per pt    PSA- No results found for: PSA      Objective:    Vitals: BP 124/78 (BP Location: Left Arm, Patient Position: Sitting, Cuff Size: Normal)   Pulse (!) 59   Ht 5\' 6"  (1.676 m)   Wt 186 lb 3.2 oz (84.5 kg)   SpO2 98%   BMI 30.05 kg/m   Body mass index is 30.05 kg/m.  Advanced Directives 10/03/2017 03/05/2016 03/03/2015  Does Patient Have a Medical Advance Directive? Yes Yes Yes  Type of Paramedic of [place];Living will [place];Living will [place];Living will  Does patient want to make changes to medical advance directive? - No - Patient declined Yes - information given  Copy of [place] in Chart? No - copy requested Yes No - copy requested    Tobacco Social History   Tobacco Use  Smoking Status Former Smoker  . Packs/day: 1.50  . Years: 15.00  . Pack years: 22.50  . Start date: 10/07/1978  Smokeless Tobacco Never Used     Counseling given: Not Answered   Clinical Intake: Pain : No/denies pain   Past Medical History:  Diagnosis Date  . Anemia    mild  . Arthritis 04/06/2017  . Asthma    childhood  . BPH (benign prostatic hyperplasia)   . Cancer ([place])    skin- on nose- basal cell (20 yrs ago) forehead 1 year ago  . Cerumen impaction 08/10/2012  . Chicken pox as child  . Depression 08/01/2012  . [place] measles as a child  . Hyperlipidemia   .  Hypertension   . Macular degeneration   . Medicare annual wellness visit, subsequent 11/07/2013  . Otitis externa 08/10/2012  . Preventative health care 08/01/2012  . Right shoulder pain 09/26/2014  . Thrombocytopenia ([place])    Past Surgical History:  Procedure Laterality Date  . [place]     very young, b/l  . HYDROCELE EXCISION / REPAIR  2012   b/l  . SKIN CANCER EXCISION     nose and forehead   Family History  Problem Relation Age of Onset  . Hypertension Mother   . Other Mother        stents  . Heart disease Mother   . Parkinsonism Father   . Heart disease Father   . Cancer Father 80       prostate  . Hypertension Father   . Cancer Maternal Grandfather        prostate ?  . ADD / ADHD Son        ADHD  . Other Son 24       part of 1 lung removed- due to infection  . Stroke Maternal Grandmother   . Cancer Paternal Grandfather    Social History   Socioeconomic History  . Marital status: Married    Spouse name: None  . Number of children: None  . Years of education: None  . Highest education level: None  Social Needs  . Financial resource strain: None  . Food insecurity - worry: None  . Food insecurity - inability: None  . Transportation needs - medical: None  . Transportation needs - non-medical: None  Occupational History  . None  Tobacco Use  . Smoking status: Former Smoker    Packs/day: 1.50    Years: 15.00    Pack years: 22.50    Start date: 10/07/1978  . Smokeless tobacco: Never Used  Substance and Sexual Activity  . Alcohol use: Yes    Comment: 2 glasses of wine daily  . Drug use: No  . 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  Other Topics Concern  . None  Social History Narrative  . None    Outpatient Encounter Medications as of 10/03/2017  Medication Sig  . aspirin 81 MG tablet Take 81 mg by mouth daily.  . hydrochlorothiazide  (HYDRODIURIL) 25 MG tablet TAKE 1 TABLET BY MOUTH EVERY DAY  . Multiple Vitamins-Minerals (MULTIVITAMIN MEN 50+ PO) Take 1 tablet by mouth daily.  . sertraline (ZOLOFT) 50 MG tablet TAKE 1 TABLET BY MOUTH EVERY DAY  . simvastatin (ZOCOR) 20 MG tablet TAKE 1 TABLET (20 MG TOTAL) BY MOUTH EVERY EVENING.  [place] Kitchen terazosin (HYTRIN) 5 MG capsule Take 5 mg by mouth daily.   No facility-administered encounter medications on file as of 10/03/2017.     Activities of Daily Living In your present state of health, do you have any difficulty performing the following activities: 10/03/2017  Hearing? Y  Comment Bilateral hearing aids  Vision? N  Comment Reading glasses. Dr.Mccuin annually.  Difficulty concentrating or making decisions? N  Walking or climbing stairs? N  Dressing or bathing? N  Doing errands, shopping? N  Preparing Food and eating ? N  Using the Toilet? N  In the past six months, have you accidently leaked urine? N  Do you have problems with loss of bowel control? N  Managing your Medications? N  Managing your Finances? N  Housekeeping or managing your Housekeeping? N  Some recent data might be hidden    Patient Care Team: Walter Lukes, MD as PCP - General (Family Medicine) Walter Brown, Walter Bravo, MD as Referring Physician (Dermatology) Walter Mutter, MD as Consulting Physician (Ophthalmology) Walter Hughs, MD as Consulting Physician (Urology)   Assessment:   This is a routine wellness examination for [name]. Physical assessment deferred to PCP.  Exercise Activities and Dietary recommendations Current Exercise Habits: Home exercise routine, Type of exercise: calisthenics;strength training/weights;treadmill, Frequency (Times/Week): 4, Intensity: Moderate   Diet (meal preparation, eat out, water intake, caffeinated beverages, dairy products, fruits and vegetables): in general, a "healthy" diet     Goals    . Drink 6-8 glasses of water per day.    . Weight (lb) <  170 lb (77.1 kg) (pt-stated)     By continuing to exercise and eating a low carb diet.       Fall Risk Fall Risk  10/03/2017 03/05/2016 03/03/2015 11/04/2013  Falls in the past year? No No No No    Depression Screen PHQ 2/9 Scores 10/03/2017 03/05/2016 03/03/2015 11/04/2013  PHQ - 2 Score 0 1 0 1    Cognitive Function Ad8 score reviewed for issues:  Issues making decisions:no  Less interest in hobbies / activities:no  Repeats questions, stories (family complaining):no  Trouble using ordinary gadgets (microwave, computer, phone):no  Forgets the month  or year: no  Mismanaging finances: no  Remembering appts:no  Daily problems with thinking and/or memory:no Ad8 score is=0   MMSE - Mini Mental State Exam 03/05/2016  Orientation to time 5  Orientation to Place 5  Registration 3  Attention/ Calculation 5  Recall 3  Language- name 2 objects 2  Language- repeat 1  Language- follow 3 step command 3  Language- read & follow direction 1  Write a sentence 1  Copy design 1  Total score 30        Immunization History  Administered Date(s) Administered  . Influenza Split 07/02/2012  . Influenza Whole 06/07/2013  . Influenza,inj,Quad PF,6+ Mos 06/21/2014  . Influenza-Unspecified 06/07/2016, 07/08/2017  . Pneumococcal Conjugate-13 11/04/2013  . Pneumococcal Polysaccharide-23 10/08/2007  . Tdap 11/04/2013  . Zoster 10/07/1998     Screening Tests Health Maintenance  Topic Date Due  . TETANUS/TDAP  11/05/2023  . INFLUENZA VACCINE  Completed  . PNA vac Low Risk Adult  Completed      Plan:   Follow up with Dr.Blyth as scheduled.  Continue to eat heart healthy diet (full of fruits, vegetables, whole grains, lean protein, water--limit salt, fat, and sugar intake) and increase physical activity as tolerated.  Continue doing brain stimulating activities (puzzles, reading, adult coloring books, staying active) to keep memory sharp.   Bring a copy of your living will  and/or healthcare power of attorney to your next office visit.  I have personally reviewed and noted the following in the patient's chart:   . Medical and social history . Use of alcohol, tobacco or illicit drugs  . Current medications and supplements . Functional ability and status . Nutritional status . Physical activity . Advanced directives . List of other physicians . Hospitalizations, surgeries, and ER visits in previous 12 months . Vitals . Screenings to include cognitive, depression, and falls . Referrals and appointments  In addition, I have reviewed and discussed with patient certain preventive protocols, quality metrics, and best practice recommendations. A written personalized care plan for preventive services as well as general preventive health recommendations were provided to patient.     Shela Nevin, [place]  10/03/2017

## 2017-10-05 DIAGNOSIS — J180 Bronchopneumonia, unspecified organism: Secondary | ICD-10-CM | POA: Diagnosis not present

## 2017-10-14 ENCOUNTER — Ambulatory Visit (INDEPENDENT_AMBULATORY_CARE_PROVIDER_SITE_OTHER): Payer: Medicare HMO | Admitting: Family Medicine

## 2017-10-14 ENCOUNTER — Encounter: Payer: Self-pay | Admitting: Family Medicine

## 2017-10-14 DIAGNOSIS — D649 Anemia, unspecified: Secondary | ICD-10-CM

## 2017-10-14 DIAGNOSIS — E785 Hyperlipidemia, unspecified: Secondary | ICD-10-CM

## 2017-10-14 DIAGNOSIS — H6123 Impacted cerumen, bilateral: Secondary | ICD-10-CM | POA: Diagnosis not present

## 2017-10-14 DIAGNOSIS — I10 Essential (primary) hypertension: Secondary | ICD-10-CM

## 2017-10-14 DIAGNOSIS — J45909 Unspecified asthma, uncomplicated: Secondary | ICD-10-CM | POA: Diagnosis not present

## 2017-10-14 LAB — COMPREHENSIVE METABOLIC PANEL
ALK PHOS: 47 U/L (ref 39–117)
ALT: 13 U/L (ref 0–53)
AST: 18 U/L (ref 0–37)
Albumin: 4.3 g/dL (ref 3.5–5.2)
BUN: 16 mg/dL (ref 6–23)
CO2: 30 meq/L (ref 19–32)
Calcium: 9.7 mg/dL (ref 8.4–10.5)
Chloride: 103 mEq/L (ref 96–112)
Creatinine, Ser: 1.04 mg/dL (ref 0.40–1.50)
GFR: 73.7 mL/min (ref >60.00)
GLUCOSE: 92 mg/dL (ref 70–99)
POTASSIUM: 3.6 meq/L (ref 3.5–5.1)
Sodium: 137 mEq/L (ref 135–145)
TOTAL PROTEIN: 6.7 g/dL (ref 6.0–8.3)
Total Bilirubin: 0.8 mg/dL (ref 0.2–1.2)

## 2017-10-14 LAB — TSH: TSH: 1.69 u[IU]/mL (ref 0.35–4.50)

## 2017-10-14 LAB — LIPID PANEL
CHOL/HDL RATIO: 2
Cholesterol: 134 mg/dL (ref 0–200)
HDL: 56.7 mg/dL (ref >39.00)
LDL CALC: 60 mg/dL (ref 0–99)
NONHDL: 77
Triglycerides: 85 mg/dL (ref 0.0–149.0)
VLDL: 17 mg/dL (ref 0.0–40.0)

## 2017-10-14 LAB — CBC
HEMATOCRIT: 41.8 % (ref 39.0–52.0)
Hemoglobin: 14 g/dL (ref 13.0–17.0)
MCHC: 33.5 g/dL (ref 30.0–36.0)
MCV: 92.8 fl (ref 78.0–100.0)
Platelets: 118 10*3/uL — ABNORMAL LOW (ref 150.0–400.0)
RBC: 4.5 Mil/uL (ref 4.22–5.81)
RDW: 13.8 % (ref 11.5–15.5)
WBC: 7.8 10*3/uL (ref 4.0–10.5)

## 2017-10-14 MED ORDER — ALBUTEROL SULFATE HFA 108 (90 BASE) MCG/ACT IN AERS: 2.0000 | INHALATION_SPRAY | Freq: Four times a day (QID) | RESPIRATORY_TRACT | 1 refills | Status: DC | PRN

## 2017-10-14 NOTE — Assessment & Plan Note (Signed)
Well controlled, no changes to meds. Encouraged heart healthy diet such as the DASH diet and exercise as tolerated.  °

## 2017-10-14 NOTE — Patient Instructions (Addendum)
Shingrix is the new shingles shot, 2 shots over the next 2-6 months  Plain Mucinex twice daily for next week and Probiotic such as Digestive Health, Culturelle, or Align for the next month Hypertension Hypertension is another name for high blood pressure. High blood pressure forces your heart to work harder to pump blood. This can cause problems over time. There are two numbers in a blood pressure reading. There is a top number (systolic) over a bottom number (diastolic). It is best to have a blood pressure below 120/80. Healthy choices can help lower your blood pressure. You may need medicine to help lower your blood pressure if:  Your blood pressure cannot be lowered with healthy choices.  Your blood pressure is higher than 130/80.  Follow these instructions at home: Eating and drinking  If directed, follow the DASH eating plan. This diet includes: ? Filling half of your plate at each meal with fruits and vegetables. ? Filling one quarter of your plate at each meal with whole grains. Whole grains include whole wheat pasta, brown rice, and whole grain bread. ? Eating or drinking low-fat dairy products, such as skim milk or low-fat yogurt. ? Filling one quarter of your plate at each meal with low-fat (lean) proteins. Low-fat proteins include fish, skinless chicken, eggs, beans, and tofu. ? Avoiding fatty meat, cured and processed meat, or chicken with skin. ? Avoiding premade or processed food.  Eat less than 1,500 mg of salt (sodium) a day.  Limit alcohol use to no more than 1 drink a day for nonpregnant women and 2 drinks a day for men. One drink equals 12 oz of beer, 5 oz of wine, or 1 oz of hard liquor. Lifestyle  Work with your doctor to stay at a healthy weight or to lose weight. Ask your doctor what the best weight is for you.  Get at least 30 minutes of exercise that causes your heart to beat faster (aerobic exercise) most days of the week. This may include walking, swimming, or  biking.  Get at least 30 minutes of exercise that strengthens your muscles (resistance exercise) at least 3 days a week. This may include lifting weights or pilates.  Do not use any products that contain nicotine or tobacco. This includes cigarettes and e-cigarettes. If you need help quitting, ask your doctor.  Check your blood pressure at home as told by your doctor.  Keep all follow-up visits as told by your doctor. This is important. Medicines  Take over-the-counter and prescription medicines only as told by your doctor. Follow directions carefully.  Do not skip doses of blood pressure medicine. The medicine does not work as well if you skip doses. Skipping doses also puts you at risk for problems.  Ask your doctor about side effects or reactions to medicines that you should watch for. Contact a doctor if:  You think you are having a reaction to the medicine you are taking.  You have headaches that keep coming back (recurring).  You feel dizzy.  You have swelling in your ankles.  You have trouble with your vision. Get help right away if:  You get a very bad headache.  You start to feel confused.  You feel weak or numb.  You feel faint.  You get very bad pain in your: ? Chest. ? Belly (abdomen).  You throw up (vomit) more than once.  You have trouble breathing. Summary  Hypertension is another name for high blood pressure.  Making healthy choices can help  lower blood pressure. If your blood pressure cannot be controlled with healthy choices, you may need to take medicine. This information is not intended to replace advice given to you by your health care provider. Make sure you discuss any questions you have with your health care provider. Document Released: 03/11/2008 Document Revised: 08/21/2016 Document Reviewed: 08/21/2016 Elsevier Interactive Patient Education  [employer].

## 2017-10-14 NOTE — Progress Notes (Signed)
Subjective:  I acted as a Education administrator for Dr. Charlett Blake. [name], [place]  Patient ID: Walter Brown, male    DOB: 1940-12-13, 77 y.o.   MRN: [id number]  No chief complaint on file.   HPI  Patient is in today for a 6 month follow up and follow-up on a recent episode of pneumonia.  He traveled to [place] over the Christmas holidays and was exposed to a bad respiratory illness.  Upon returning he went to an urgent care and was diagnosed with pneumonia.  He was treated with doxycycline, Flonase and Mucinex he is improving but he does still continue to cough and have some congestion.  No fevers or chills.  No recent fever or hospitalizations.  Is questioning whether or not he has some earwax again but denies any hearing loss or pain. Denies CP/palp/SOB/HA/fevers/GI or GU c/o. Taking meds as prescribed  Patient Care Team: [name], MD as PCP - General (Family Medicine) Renda Rolls, Jennefer Bravo, MD as Referring Physician (Dermatology) Luberta Mutter, MD as Consulting Physician (Ophthalmology) Ardis Hughs, MD as Consulting Physician (Urology)   Past Medical History:  Diagnosis Date  . Anemia    mild  . Arthritis 04/06/2017  . Asthma    childhood  . BPH (benign prostatic hyperplasia)   . Cancer ([place])    skin- on nose- basal cell (20 yrs ago) forehead 1 year ago  . Cerumen impaction 08/10/2012  . Chicken pox as child  . Depression 08/01/2012  . [place] measles as a child  . Hyperlipidemia   . Hypertension   . Macular degeneration   . Medicare annual wellness visit, subsequent 11/07/2013  . Otitis externa 08/10/2012  . Preventative health care 08/01/2012  . Right shoulder pain 09/26/2014  . Thrombocytopenia ([place])     Past Surgical History:  Procedure Laterality Date  . [place]     very young, b/l  . HYDROCELE EXCISION / REPAIR  2012   b/l  . SKIN CANCER EXCISION     nose and forehead    Family History  Problem Relation Age of Onset  . Hypertension Mother   .  Other Mother        stents  . Heart disease Mother   . Parkinsonism Father   . Heart disease Father   . Cancer Father 29       prostate  . Hypertension Father   . Cancer Maternal Grandfather        prostate ?  . ADD / ADHD Son        ADHD  . Other Son 24       part of 1 lung removed- due to infection  . Stroke Maternal Grandmother   . Cancer Paternal Grandfather     Social History   Socioeconomic History  . Marital status: Married    Spouse name: Not on file  . Number of children: Not on file  . Years of education: Not on file  . Highest education level: Not on file  Social Needs  . Financial resource strain: Not on file  . Food insecurity - worry: Not on file  . Food insecurity - inability: Not on file  . Transportation needs - medical: Not on file  . Transportation needs - non-medical: Not on file  Occupational History  . Not on file  Tobacco Use  . Smoking status: Former Smoker    Packs/day: 1.50    Years: 15.00    Pack years: 22.50  Start date: 10/07/1978  . Smokeless tobacco: Never Used  Substance and Sexual Activity  . Alcohol use: Yes    Comment: 2 glasses of wine daily  . Drug use: No  . 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  Other Topics Concern  . Not on file  Social History Narrative  . Not on file    Outpatient Medications Prior to Visit  Medication Sig Dispense Refill  . aspirin 81 MG tablet Take 81 mg by mouth daily.    . hydrochlorothiazide (HYDRODIURIL) 25 MG tablet TAKE 1 TABLET BY MOUTH EVERY DAY 90 tablet 0  . Multiple Vitamins-Minerals (MULTIVITAMIN MEN 50+ PO) Take 1 tablet by mouth daily.    . sertraline (ZOLOFT) 50 MG tablet TAKE 1 TABLET BY MOUTH EVERY DAY 90 tablet 0  . simvastatin (ZOCOR) 20 MG tablet TAKE 1 TABLET (20 MG TOTAL) BY MOUTH EVERY EVENING. 90 tablet 0  . terazosin (HYTRIN) 5 MG capsule Take 5 mg by mouth daily.     No  facility-administered medications prior to visit.     No Known Allergies  Review of Systems  Constitutional: Negative for fever and malaise/fatigue.  HENT: Positive for congestion. Negative for hearing loss and tinnitus.   Eyes: Negative for blurred vision.  Respiratory: Positive for cough. Negative for shortness of breath.   Cardiovascular: Negative for chest pain, palpitations and leg swelling.  Gastrointestinal: Negative for vomiting.  Musculoskeletal: Negative for back pain.  Skin: Negative for rash.  Neurological: Negative for loss of consciousness and headaches.       Objective:    Physical Exam  Constitutional: He is oriented to person, place, and time. He appears well-developed and well-nourished. No distress.  HENT:  Head: Normocephalic and atraumatic.  Eyes: Conjunctivae are normal.  Neck: Normal range of motion. No thyromegaly present.  Cardiovascular: Normal rate and regular rhythm.  Pulmonary/Chest: Effort normal and breath sounds normal. He has no wheezes.  Abdominal: Soft. Bowel sounds are normal. There is no tenderness.  Musculoskeletal: Normal range of motion. He exhibits no edema or deformity.  Neurological: He is alert and oriented to person, place, and time.  Skin: Skin is warm and dry. He is not diaphoretic.  Psychiatric: He has a normal mood and affect.    BP 122/68 (BP Location: Left Arm, Patient Position: Sitting, Cuff Size: Normal)   Pulse 67   Temp (!) 97.3 F (36.3 C) (Oral)   Resp 18   Wt 183 lb 12.8 oz (83.4 kg)   SpO2 98%   BMI 29.67 kg/m  Wt Readings from Last 3 Encounters:  10/14/17 183 lb 12.8 oz (83.4 kg)  10/03/17 186 lb 3.2 oz (84.5 kg)  04/04/17 183 lb (83 kg)   BP Readings from Last 3 Encounters:  10/14/17 122/68  10/03/17 124/78  04/04/17 126/72     Immunization History  Administered Date(s) Administered  . Influenza Split 07/02/2012  . Influenza Whole 06/07/2013  . Influenza,inj,Quad PF,6+ Mos 06/21/2014  .  Influenza-Unspecified 06/07/2016, 07/08/2017  . Pneumococcal Conjugate-13 11/04/2013  . Pneumococcal Polysaccharide-23 10/08/2007  . Tdap 11/04/2013  . Zoster 10/07/1998    Health Maintenance  Topic Date Due  . TETANUS/TDAP  11/05/2023  . INFLUENZA VACCINE  Completed  . PNA vac Low Risk Adult  Completed    Lab Results  Component Value Date   WBC 7.8 10/14/2017   HGB 14.0 10/14/2017   HCT 41.8  10/14/2017   PLT 118.0 (L) 10/14/2017   GLUCOSE 92 10/14/2017   CHOL 134 10/14/2017   TRIG 85.0 10/14/2017   HDL 56.70 10/14/2017   LDLCALC 60 10/14/2017   ALT 13 10/14/2017   AST 18 10/14/2017   NA 137 10/14/2017   K 3.6 10/14/2017   CL 103 10/14/2017   CREATININE 1.04 10/14/2017   BUN 16 10/14/2017   CO2 30 10/14/2017   TSH 1.69 10/14/2017    Lab Results  Component Value Date   TSH 1.69 10/14/2017   Lab Results  Component Value Date   WBC 7.8 10/14/2017   HGB 14.0 10/14/2017   HCT 41.8 10/14/2017   MCV 92.8 10/14/2017   PLT 118.0 (L) 10/14/2017   Lab Results  Component Value Date   NA 137 10/14/2017   K 3.6 10/14/2017   CO2 30 10/14/2017   GLUCOSE 92 10/14/2017   BUN 16 10/14/2017   CREATININE 1.04 10/14/2017   BILITOT 0.8 10/14/2017   ALKPHOS 47 10/14/2017   AST 18 10/14/2017   ALT 13 10/14/2017   PROT 6.7 10/14/2017   ALBUMIN 4.3 10/14/2017   CALCIUM 9.7 10/14/2017   GFR 73.70 10/14/2017   Lab Results  Component Value Date   CHOL 134 10/14/2017   Lab Results  Component Value Date   HDL 56.70 10/14/2017   Lab Results  Component Value Date   LDLCALC 60 10/14/2017   Lab Results  Component Value Date   TRIG 85.0 10/14/2017   Lab Results  Component Value Date   CHOLHDL 2 10/14/2017   No results found for: HGBA1C       Assessment & Plan:   Problem List Items Addressed This Visit    Anemia    Check a cbc      Relevant Orders   CBC (Completed)   Asthma    Suffered a recent bronchitis requiring treatment but is improving after a course  of Doxycycline, is continuing Flonase. Given Albuterol to use prn but report any new concerns.       Relevant Medications   albuterol (PROVENTIL HFA;VENTOLIN HFA) 108 (90 Base) MCG/ACT inhaler   Hyperlipidemia    Tolerating statin, encouraged heart healthy diet, avoid trans fats, minimize simple carbs and saturated fats. Increase exercise as tolerated      Relevant Orders   Lipid panel (Completed)   Hypertension    Well controlled, no changes to meds. Encouraged heart healthy diet such as the DASH diet and exercise as tolerated.       Relevant Orders   CBC (Completed)   Comprehensive metabolic panel (Completed)   TSH (Completed)   Cerumen impaction    Ears clear today         I am having Ferdinand O. Zurcher start on albuterol. I am also having him maintain his terazosin, aspirin, Multiple Vitamins-Minerals (MULTIVITAMIN MEN 50+ PO), sertraline, hydrochlorothiazide, and simvastatin.  Meds ordered this encounter  Medications  . albuterol (PROVENTIL HFA;VENTOLIN HFA) 108 (90 Base) MCG/ACT inhaler    Sig: Inhale 2 puffs into the lungs every 6 (six) hours as needed for wheezing or shortness of breath.    Dispense:  1 Inhaler    Refill:  1    CMA served as scribe during this visit. History, Physical and Plan performed by medical provider. Documentation and orders reviewed and attested to.  Penni Homans, MD

## 2017-10-14 NOTE — Assessment & Plan Note (Signed)
Tolerating statin, encouraged heart healthy diet, avoid trans fats, minimize simple carbs and saturated fats. Increase exercise as tolerated 

## 2017-10-14 NOTE — Assessment & Plan Note (Signed)
Check a cbc

## 2017-10-17 DIAGNOSIS — I10 Essential (primary) hypertension: Secondary | ICD-10-CM

## 2017-10-17 DIAGNOSIS — D649 Anemia, unspecified: Secondary | ICD-10-CM

## 2017-10-17 DIAGNOSIS — E785 Hyperlipidemia, unspecified: Secondary | ICD-10-CM

## 2017-10-19 NOTE — Assessment & Plan Note (Signed)
Suffered a recent bronchitis requiring treatment but is improving after a course of Doxycycline, is continuing Flonase. Given Albuterol to use prn but report any new concerns.

## 2017-10-19 NOTE — Assessment & Plan Note (Signed)
Ears clear today

## 2017-10-22 DIAGNOSIS — K644 Residual hemorrhoidal skin tags: Secondary | ICD-10-CM | POA: Diagnosis not present

## 2017-10-22 DIAGNOSIS — K641 Second degree hemorrhoids: Secondary | ICD-10-CM | POA: Diagnosis not present

## 2017-10-22 DIAGNOSIS — M1990 Unspecified osteoarthritis, unspecified site: Secondary | ICD-10-CM | POA: Diagnosis not present

## 2017-10-22 DIAGNOSIS — J45909 Unspecified asthma, uncomplicated: Secondary | ICD-10-CM | POA: Diagnosis not present

## 2017-10-22 DIAGNOSIS — E785 Hyperlipidemia, unspecified: Secondary | ICD-10-CM | POA: Diagnosis not present

## 2017-10-22 DIAGNOSIS — Z79899 Other long term (current) drug therapy: Secondary | ICD-10-CM | POA: Diagnosis not present

## 2017-10-22 DIAGNOSIS — Z87891 Personal history of nicotine dependence: Secondary | ICD-10-CM | POA: Diagnosis not present

## 2017-10-22 DIAGNOSIS — I10 Essential (primary) hypertension: Secondary | ICD-10-CM | POA: Diagnosis not present

## 2017-10-22 DIAGNOSIS — K648 Other hemorrhoids: Secondary | ICD-10-CM | POA: Diagnosis not present

## 2017-10-22 DIAGNOSIS — Z7982 Long term (current) use of aspirin: Secondary | ICD-10-CM | POA: Diagnosis not present

## 2017-10-23 ENCOUNTER — Encounter: Payer: Self-pay | Admitting: Family Medicine

## 2017-10-26 DIAGNOSIS — J209 Acute bronchitis, unspecified: Secondary | ICD-10-CM | POA: Diagnosis not present

## 2017-10-26 DIAGNOSIS — R05 Cough: Secondary | ICD-10-CM | POA: Diagnosis not present

## 2017-11-05 DIAGNOSIS — R69 Illness, unspecified: Secondary | ICD-10-CM | POA: Diagnosis not present

## 2017-11-07 DIAGNOSIS — R7989 Other specified abnormal findings of blood chemistry: Secondary | ICD-10-CM

## 2017-11-07 HISTORY — DX: Other specified abnormal findings of blood chemistry: R79.89

## 2017-11-13 DIAGNOSIS — H43812 Vitreous degeneration, left eye: Secondary | ICD-10-CM | POA: Diagnosis not present

## 2017-11-18 ENCOUNTER — Other Ambulatory Visit: Payer: Self-pay

## 2017-11-18 ENCOUNTER — Other Ambulatory Visit (INDEPENDENT_AMBULATORY_CARE_PROVIDER_SITE_OTHER): Payer: Medicare HMO

## 2017-11-18 ENCOUNTER — Ambulatory Visit: Payer: Self-pay

## 2017-11-18 DIAGNOSIS — I10 Essential (primary) hypertension: Secondary | ICD-10-CM | POA: Diagnosis not present

## 2017-11-18 DIAGNOSIS — R8279 Other abnormal findings on microbiological examination of urine: Secondary | ICD-10-CM | POA: Diagnosis not present

## 2017-11-18 NOTE — Telephone Encounter (Signed)
Pt called to report 8-10 day h/o "bright orange urine" and "beige to orange colored stool.". Pt denies any dysuria, flank pain, fever. No blood noted in urine or stool. No flank or back pain. No abdominal pain. Called PCP office and informed Rod Holler of findings and assessment. Rod Holler asked NT to schedule lab appt for today for a CMET and per Dr Charlett Blake and Rod Holler, asked to schedule pt in 2 weeks.  Next available appt is 12/08/17 at 1500. Appt made for this day. Pt concerned about appt being over 2 weeks away but NT reassured pt that he is getting blood work today and depending on the results the office will call him and discuss plan of care. Reason for Disposition . [1] Abnormal color is unexplained AND [2] persists > 24 hours    See "Documentation"  Answer Assessment - Initial Assessment Questions 1. COLOR: "What color is it?" "Is that color in part or all of the stool?"     Beige to orange 2. ONSET: "When was the unusual color first noted?"     8-10 days ago 3. CAUSE: "Have you eaten any food or taken any medicine of this color?" (See listing in BACKGROUND)     no 4. OTHER SYMPTOMS: "Do you have any other symptoms?" (e.g., diarrhea, jaundice, abdominal pain, fever).     no  Answer Assessment - Initial Assessment Questions 1. COLOR of URINE: "Describe the color of the urine."  (e.g., tea-colored, pink, red, blood clots, bloody)     Bright orange 2. ONSET: "When did the bleeding start?"   No bleeding noted 3. EPISODES: "How many times has there been blood in the urine?" or "How many times today?"     No visible blood 4. PAIN with URINATION: "Is there any pain with passing your urine?" If so, ask: "How bad is the pain?"  (Scale 1-10; or mild, moderate, severe)    - MILD - complains slightly about urination hurting    - MODERATE - interferes with normal activities      - SEVERE - excruciating, unwilling or unable to urinate because of the pain      no 5. FEVER: "Do you have a fever?" If so, ask: "What  is your temperature, how was it measured, and when did it start?"     no 6. ASSOCIATED SYMPTOMS: "Are you passing urine more frequently than usual?"     no 7. OTHER SYMPTOMS: "Do you have any other symptoms?" (e.g., back/flank pain, abdominal pain, vomiting)     no 8. PREGNANCY: "Is there any chance you are pregnant?" "When was your last menstrual period?"     n/a  Protocols used: STOOLS - UNUSUAL COLOR-A-AH, URINE - BLOOD IN-A-AH

## 2017-11-18 NOTE — Telephone Encounter (Signed)
Per Dr. Charlett Blake nurse triage advised to schedule patient for CMP today at our lab and to schedule him for follow up within the next 2 weeks with her or one of our other provider(Will see patient sooner if CMP shows any critical values)

## 2017-11-19 ENCOUNTER — Emergency Department (HOSPITAL_COMMUNITY): Payer: Medicare HMO

## 2017-11-19 ENCOUNTER — Emergency Department (HOSPITAL_COMMUNITY)
Admission: EM | Admit: 2017-11-19 | Discharge: 2017-11-19 | Disposition: A | Discharge status: Home / Self Care | Payer: Medicare HMO | Attending: Emergency Medicine | Admitting: Emergency Medicine

## 2017-11-19 ENCOUNTER — Other Ambulatory Visit: Payer: Self-pay

## 2017-11-19 ENCOUNTER — Encounter: Payer: Self-pay | Admitting: Family Medicine

## 2017-11-19 ENCOUNTER — Encounter (HOSPITAL_COMMUNITY): Payer: Self-pay

## 2017-11-19 DIAGNOSIS — R8290 Unspecified abnormal findings in urine: Secondary | ICD-10-CM | POA: Diagnosis present

## 2017-11-19 DIAGNOSIS — R945 Abnormal results of liver function studies: Secondary | ICD-10-CM | POA: Diagnosis not present

## 2017-11-19 DIAGNOSIS — J45909 Unspecified asthma, uncomplicated: Secondary | ICD-10-CM | POA: Insufficient documentation

## 2017-11-19 DIAGNOSIS — Z7982 Long term (current) use of aspirin: Secondary | ICD-10-CM | POA: Insufficient documentation

## 2017-11-19 DIAGNOSIS — Z87891 Personal history of nicotine dependence: Secondary | ICD-10-CM | POA: Insufficient documentation

## 2017-11-19 DIAGNOSIS — R7989 Other specified abnormal findings of blood chemistry: Secondary | ICD-10-CM | POA: Diagnosis not present

## 2017-11-19 DIAGNOSIS — Z79899 Other long term (current) drug therapy: Secondary | ICD-10-CM | POA: Diagnosis not present

## 2017-11-19 DIAGNOSIS — R69 Illness, unspecified: Secondary | ICD-10-CM | POA: Diagnosis not present

## 2017-11-19 DIAGNOSIS — F329 Major depressive disorder, single episode, unspecified: Secondary | ICD-10-CM | POA: Diagnosis not present

## 2017-11-19 DIAGNOSIS — I10 Essential (primary) hypertension: Secondary | ICD-10-CM | POA: Insufficient documentation

## 2017-11-19 DIAGNOSIS — R109 Unspecified abdominal pain: Secondary | ICD-10-CM | POA: Diagnosis not present

## 2017-11-19 DIAGNOSIS — Z85828 Personal history of other malignant neoplasm of skin: Secondary | ICD-10-CM | POA: Insufficient documentation

## 2017-11-19 LAB — HEPATIC FUNCTION PANEL
ALBUMIN: 3.5 g/dL (ref 3.5–5.0)
ALK PHOS: 325 U/L — AB (ref 38–126)
ALT: 321 U/L — ABNORMAL HIGH (ref 17–63)
AST: 178 U/L — ABNORMAL HIGH (ref 15–41)
BILIRUBIN INDIRECT: 2.3 mg/dL — AB (ref 0.3–0.9)
BILIRUBIN TOTAL: 4.4 mg/dL — AB (ref 0.3–1.2)
Bilirubin, Direct: 2.1 mg/dL — ABNORMAL HIGH (ref 0.1–0.5)
TOTAL PROTEIN: 6.2 g/dL — AB (ref 6.5–8.1)

## 2017-11-19 LAB — URINALYSIS, ROUTINE W REFLEX MICROSCOPIC
Bilirubin Urine: NEGATIVE
Glucose, UA: NEGATIVE mg/dL
Hgb urine dipstick: NEGATIVE
KETONES UR: NEGATIVE mg/dL
LEUKOCYTES UA: NEGATIVE
NITRITE: NEGATIVE
Protein, ur: NEGATIVE mg/dL
Specific Gravity, Urine: 1.027 (ref 1.005–1.030)
pH: 6 (ref 5.0–8.0)

## 2017-11-19 LAB — COMPREHENSIVE METABOLIC PANEL
ALT: 282 U/L — ABNORMAL HIGH (ref 0–53)
AST: 122 U/L — ABNORMAL HIGH (ref 0–37)
Albumin: 3.9 g/dL (ref 3.5–5.2)
Alkaline Phosphatase: 333 U/L — ABNORMAL HIGH (ref 39–117)
BUN: 18 mg/dL (ref 6–23)
CHLORIDE: 100 meq/L (ref 96–112)
CO2: 32 meq/L (ref 19–32)
Calcium: 9.2 mg/dL (ref 8.4–10.5)
Creatinine, Ser: 1.09 mg/dL (ref 0.40–1.50)
GFR: 69.79 mL/min (ref >60.00)
Glucose, Bld: 151 mg/dL — ABNORMAL HIGH (ref 70–99)
Potassium: 3.9 mEq/L (ref 3.5–5.1)
Sodium: 138 mEq/L (ref 135–145)
Total Bilirubin: 6.2 mg/dL — ABNORMAL HIGH (ref 0.2–1.2)
Total Protein: 6.8 g/dL (ref 6.0–8.3)

## 2017-11-19 LAB — CBC WITH DIFFERENTIAL/PLATELET
BASOS ABS: 0.1 10*3/uL (ref 0.0–0.1)
BASOS PCT: 1 %
EOS PCT: 2 %
Eosinophils Absolute: 0.2 10*3/uL (ref 0.0–0.7)
HCT: 39.6 % (ref 39.0–52.0)
Hemoglobin: 13.4 g/dL (ref 13.0–17.0)
Lymphocytes Relative: 24 %
Lymphs Abs: 1.9 10*3/uL (ref 0.7–4.0)
MCH: 30.7 pg (ref 26.0–34.0)
MCHC: 33.8 g/dL (ref 30.0–36.0)
MCV: 90.8 fL (ref 78.0–100.0)
Monocytes Absolute: 0.7 10*3/uL (ref 0.1–1.0)
Monocytes Relative: 9 %
NEUTROS ABS: 5 10*3/uL (ref 1.7–7.7)
Neutrophils Relative %: 64 %
PLATELETS: 143 10*3/uL — AB (ref 150–400)
RBC: 4.36 MIL/uL (ref 4.22–5.81)
RDW: 15 % (ref 11.5–15.5)
WBC: 8 10*3/uL (ref 4.0–10.5)

## 2017-11-19 LAB — BASIC METABOLIC PANEL
Anion gap: 11 (ref 5–15)
BUN: 19 mg/dL (ref 6–20)
CALCIUM: 9.1 mg/dL (ref 8.9–10.3)
CO2: 24 mmol/L (ref 22–32)
CREATININE: 1.1 mg/dL (ref 0.61–1.24)
Chloride: 101 mmol/L (ref 101–111)
GFR calc non Af Amer: >60 mL/min (ref >60)
Glucose, Bld: 118 mg/dL — ABNORMAL HIGH (ref 65–99)
Potassium: 3.2 mmol/L — ABNORMAL LOW (ref 3.5–5.1)
SODIUM: 136 mmol/L (ref 135–145)

## 2017-11-19 LAB — LIPASE, BLOOD: LIPASE: 50 U/L (ref 11–51)

## 2017-11-19 MED ORDER — IOPAMIDOL (ISOVUE-300) INJECTION 61%
INTRAVENOUS | Status: AC
  Administered 2017-11-19: 100 mL
  Filled 2017-11-19: qty 100

## 2017-11-19 NOTE — ED Provider Notes (Signed)
  Patient placed in Quick Look pathway, seen and evaluated for chief complaint of orange urine starting 4 days ago and light colored bowel movements. Elevated liver enzymes and elevated bilirubin per PCP blood work.   Pertinent H&P findings include Speech is clear and coherent, non-toxic appearing, no respiratory distress.   Based on initial evaluation, labs are indicated and radiology studies are not indicated.  Patient counseled on process, plan, and necessity for staying for completing the evaluation.    Waynetta Pean, PA-C 11/19/17 1647    Hayden Rasmussen, MD 11/20/17 [phone]

## 2017-11-19 NOTE — ED Notes (Signed)
Patient transported to Ultrasound 

## 2017-11-19 NOTE — ED Triage Notes (Addendum)
PT sent in by pcp d/t, orange urine, pale stool, and abnormal alt, ast, and bilirubin

## 2017-11-19 NOTE — ED Provider Notes (Signed)
Here with painless jaundice One week ago, dark urine Pancreas is normal on CT [place] RUQ and INR pending  Plan: Disposition based on results. Consult with GI, probable home, possible admission  10:35 - Introduced myself to the patient and wife. All questions addressed. Still comfortable, no pain. VSS. Waiting on [place] results. Will plan on phone consult to GI  11:15 - [place] normal. Discussed patient's condition with Dr. [name], GI, who advises office follow up is appropriate. Patient continues to be asymptomatic and comfortable. Will discharge home with GI follow up. Return precautions discussed.    Charlann Lange, PA-C 11/19/17 [id number]    Pixie Casino, MD 11/20/17 [phone]

## 2017-11-19 NOTE — ED Provider Notes (Signed)
[facility]ERGENCY DEPARTMENT Provider Note   CSN: [id number] Arrival date & time: 11/19/17  1508     History   Chief Complaint Chief Complaint  Patient presents with  . Abdominal Pain  . Abnormal Labs    HPI Walter Brown is a 77 y.o. male.  HPI  Patient with history of hypertension presenting with complaint of dark orange urine.  He says this began approximately 1-2 weeks ago.  He denies any dysuria abdominal pain or vomiting.  He has had no fever or chills.  He was seen by his urologist who noted bilirubin in his urine.  His primary doctor ordered liver function tests which were elevated.  He states he has lost approximately 5 pounds in the past week but he has been eating normally. There are no other associated systemic symptoms, there are no other alleviating or modifying factors.   Past Medical History:  Diagnosis Date  . Anemia    mild  . Arthritis 04/06/2017  . Asthma    childhood  . BPH (benign prostatic hyperplasia)   . Cancer ([place])    skin- on nose- basal cell (20 yrs ago) forehead 1 year ago  . Cerumen impaction 08/10/2012  . Chicken pox as child  . Depression 08/01/2012  . [place] measles as a child  . Hyperlipidemia   . Hypertension   . Macular degeneration   . Medicare annual wellness visit, subsequent 11/07/2013  . Otitis externa 08/10/2012  . Preventative health care 08/01/2012  . Right shoulder pain 09/26/2014  . Thrombocytopenia [facility])     Patient Active Problem List   Diagnosis Date Noted  . Arthritis 04/06/2017  . Flank pain 10/04/2016  . Macular degeneration 03/03/2015  . Right shoulder pain 09/26/2014  . Overweight 03/06/2014  . Medicare annual wellness visit, subsequent 11/07/2013  . Back pain with radiation 04/26/2013  . Cerumen impaction 08/10/2012  . Otitis externa 08/10/2012  . Preventative health care 08/01/2012  . Depression 08/01/2012  . Anemia   . Asthma   . Hyperlipidemia   . Hypertension   . BPH (benign  prostatic hyperplasia)   . Cancer ([place])   . Thrombocytopenia ([place])     Past Surgical History:  Procedure Laterality Date  . [place]     very young, b/l  . HYDROCELE EXCISION / REPAIR  2012   b/l  . SKIN CANCER EXCISION     nose and forehead       Home Medications    Prior to Admission medications   Medication Sig Start Date End Date Taking? Authorizing Provider  albuterol (PROVENTIL HFA;VENTOLIN HFA) 108 (90 Base) MCG/ACT inhaler Inhale 2 puffs into the lungs every 6 (six) hours as needed for wheezing or shortness of breath. 10/14/17  Yes [name], MD  aspirin 81 MG tablet Take 81 mg by mouth daily.   Yes [provider]  hydrochlorothiazide (HYDRODIURIL) 25 MG tablet TAKE 1 TABLET BY MOUTH EVERY DAY Patient taking differently: TAKE 25 mg TABLET BY MOUTH EVERY DAY 09/22/17  Yes [name], MD  Multiple Vitamins-Minerals (MULTIVITAMIN MEN 50+ PO) Take 1 tablet by mouth daily.   Yes [provider]  Multiple Vitamins-Minerals (OCUVITE ADULT 50+ PO) Take 1 tablet by mouth daily.   Yes [provider]  sertraline (ZOLOFT) 50 MG tablet TAKE 1 TABLET BY MOUTH EVERY DAY Patient taking differently: TAKE 50 mg TABLET BY MOUTH EVERY DAY 08/04/17  Yes [name], MD  simvastatin ([place])  20 MG tablet TAKE 1 TABLET (20 MG TOTAL) BY MOUTH EVERY EVENING. Patient taking differently: Take 10 mg by mouth every evening.  10/02/17  Yes [name], MD  terazosin (HYTRIN) 5 MG capsule Take 5 mg by mouth at bedtime.    Yes [provider]    Family History Family History  Problem Relation Age of Onset  . Hypertension Mother   . Other Mother        stents  . Heart disease Mother   . Parkinsonism Father   . Heart disease Father   . Cancer Father 70       prostate  . Hypertension Father   . Cancer Maternal Grandfather        prostate ?  . ADD / ADHD Son        ADHD  . Other Son 24       part of 1 lung removed- due to  infection  . Stroke Maternal Grandmother   . Cancer Paternal Grandfather     Social History Social History   Tobacco Use  . Smoking status: Former Smoker    Packs/day: 1.50    Years: 15.00    Pack years: 22.50    Start date: 10/07/1978  . Smokeless tobacco: Never Used  Substance Use Topics  . Alcohol use: Yes    Comment: 2 glasses of wine daily  . Drug use: No     Allergies   Patient has no known allergies.   Review of Systems Review of Systems  ROS reviewed and all otherwise negative except for mentioned in HPI   Physical Exam Updated Vital Signs BP 138/81   Pulse 60   Temp 97.6 F (36.4 C) (Oral)   Resp 18   SpO2 97%  Vitals reviewed Physical Exam  Physical Examination: General appearance - alert, well appearing, and in no distress Mental status - alert, oriented to person, place, and time Eyes - mild scleral icterus, no conjunctival injection Mouth - mucous membranes moist, pharynx normal without lesions Neck - supple, no significant adenopathy Chest - clear to auscultation, no wheezes, rales or rhonchi, symmetric air entry Heart - normal rate, regular rhythm, normal S1, S2, no murmurs, rubs, clicks or gallops Abdomen - soft, nontender, nondistended, no masses or organomegaly Neurological - alert, oriented, normal speech Extremities - peripheral pulses normal, no pedal edema, no clubbing or cyanosis Skin - normal coloration and turgor, no rashes   ED Treatments / Results  Labs (all labs ordered are listed, but only abnormal results are displayed) Labs Reviewed  BASIC METABOLIC PANEL - Abnormal; Notable for the following components:      Result Value   Potassium 3.2 (*)    Glucose, Bld 118 (*)    All other components within normal limits  HEPATIC FUNCTION PANEL - Abnormal; Notable for the following components:   Total Protein 6.2 (*)    AST 178 (*)    ALT 321 (*)    Alkaline Phosphatase 325 (*)    Total Bilirubin 4.4 (*)    Bilirubin, Direct 2.1  (*)    Indirect Bilirubin 2.3 (*)    All other components within normal limits  CBC WITH DIFFERENTIAL/PLATELET - Abnormal; Notable for the following components:   Platelets 143 (*)    All other components within normal limits  URINALYSIS, ROUTINE W REFLEX MICROSCOPIC - Abnormal; Notable for the following components:   Color, Urine AMBER (*)    All other components within normal limits  LIPASE,  BLOOD    EKG  EKG Interpretation None       Radiology Ct Abdomen Pelvis W Contrast  Result Date: 11/19/2017 CLINICAL DATA:  77 year old male with abdominal pain and unintentional weight loss in the past 6-8 weeks. EXAM: CT ABDOMEN AND PELVIS WITH CONTRAST TECHNIQUE: Multidetector CT imaging of the abdomen and pelvis was performed using the standard protocol following bolus administration of intravenous contrast. CONTRAST:  100 milliliters ISOVUE-300 IOPAMIDOL (ISOVUE-300) INJECTION 61% COMPARISON:  Lumbar radiographs 10/04/2016. FINDINGS: Lower chest: Borderline to mild cardiomegaly. No pericardial effusion. Mild posterior basal segment right lower lobe atelectasis or scarring. Otherwise negative lung bases. No pleural effusion. Hepatobiliary: Negative liver and gallbladder. Pancreas: Negative. Spleen: Negative. Adrenals/Urinary Tract: Normal adrenal glands. Bilateral renal enhancement and contrast excretion is symmetric and within normal limits. No nephrolithiasis or hydroureter. The urinary bladder is described below. Stomach/Bowel: Negative rectum aside from retained stool. Redundant sigmoid colon tracks into the right upper quadrant the where segments of the sigmoid are intermittently gas distended and decompressed, accounting for much of the right upper quadrant region gas distended bowel on the scout view. Decompressed proximal sigmoid colon then tracks back down into the pelvis and joins the descending colon which is decompressed. The splenic flexure is also redundant, but decompressed. There is  retained stool throughout the transverse colon. The hepatic flexure is also redundant, but is decompressed. The cecum appears to beyond a very lax mesentery and is located in the epigastric region. The terminal ileum and ileocecal valve are demonstrated on coronal image 38. The appendix is not identified. No large bowel wall thickening or mesenteric inflammation is evident. No dilated small bowel. The duodenum and proximal small bowel appear normally rotated. Negative stomach. No abdominal free air or free fluid. Vascular/Lymphatic: Duplicated IVC, normal variant. Aortoiliac calcified atherosclerosis. Major arterial structures in the abdomen and pelvis are patent. The portal venous system appears patent. No lymphadenopathy. Reproductive: Prostate enlargement with a lobulated appearance of the superior prostate margin pressing on the base of the bladder (sagittal image 94). The urinary bladder is otherwise unremarkable. Evidence of right hydrocele (series 3, image 80), incompletely visible. Other: No pelvic free fluid. Musculoskeletal: Degenerative changes throughout the lumbar spine. No acute or suspicious osseous lesion. IMPRESSION: 1. Nonspecific prostate enlargement, with a lobulated superior prostate margin pressing on the base of the urinary bladder. Recommend Urology follow-up. 2. The large bowel is highly redundant. The sigmoid colon has several redundant loops in the right upper quadrant which are intermittently gas distended and decompressed. And the cecum is located in the epigastrium. Still, there is no evidence of large bowel volvulus or inflammation at this time. 3. Partially visible right hydrocele, probably postinflammatory. 4. No acute or inflammatory process identified. Electronically Signed   By: Genevie Ann M.D.   On: 11/19/2017 21:41    Procedures Procedures (including critical care time)  Medications Ordered in ED Medications  iopamidol (ISOVUE-300) 61 % injection (100 mLs  Contrast Given  11/19/17 2113)     Initial Impression / Assessment and Plan / ED Course  I have reviewed the triage vital signs and the nursing notes.  Pertinent labs & imaging results that were available during my care of the patient were reviewed by me and considered in my medical decision making (see chart for details).    Patient presenting with elevated liver function test.  His bilirubin both direct and indirect are elevated.  He has not had significant abdominal pain.  He first noticed darkness and orange discoloration  of his urine.  CT abdomen and pelvis was obtained which did not show any abnormality of the pancreas liver or gallbladder.  There is no evidence of bowel obstruction or volvulus although the CT does mention a redundant colon there are no acute findings.  Due to his abnormal liver tests will order right upper quadrant ultrasound to further evaluate this.  The ultrasound will determine disposition.  If this is normal GI consultation should be requested.  If the ultrasound shows stones or cholecystitis or other acute abnormality patient will likely need to be admitted. Pt signed out at the end of my shift to the oncoming provider.  Final Clinical Impressions(s) / ED Diagnoses   Final diagnoses:  Elevated liver function tests    ED Discharge Orders    None       Pixie Casino, MD 11/19/17 2156

## 2017-11-20 ENCOUNTER — Telehealth: Payer: Self-pay

## 2017-11-20 ENCOUNTER — Other Ambulatory Visit: Payer: Medicare HMO

## 2017-11-20 ENCOUNTER — Other Ambulatory Visit: Payer: Self-pay

## 2017-11-20 DIAGNOSIS — R7989 Other specified abnormal findings of blood chemistry: Secondary | ICD-10-CM

## 2017-11-20 DIAGNOSIS — R945 Abnormal results of liver function studies: Principal | ICD-10-CM

## 2017-11-20 LAB — PROTIME-INR
INR: 0.9
Prothrombin Time: 12 seconds (ref 11.4–15.2)

## 2017-11-20 LAB — ACETAMINOPHEN LEVEL: Acetaminophen (Tylenol), Serum: <10 ug/mL — ABNORMAL LOW (ref 10–30)

## 2017-11-20 NOTE — Telephone Encounter (Signed)
Per Dr. [name] pt see in the ER last night for Jaundice/elevated lfts. Pt was not having any pain. Pt scheduled to see Dr. [name] 11/24/17 @8 :45am. Pt instructed to come for labs today or tomorrow so results will be back. Pt instructed to stay away from all alcohol and tylenol, push fluids, but if he developed any pain he would need to go to the ER. Pt verbalized understanding.

## 2017-11-21 LAB — HEPATITIS PANEL, ACUTE
HEP B S AG: NONREACTIVE
HEP C AB: NONREACTIVE
Hep A IgM: NONREACTIVE
Hep B C IgM: NONREACTIVE
SIGNAL TO CUT-OFF: 0.02 (ref <1.00)

## 2017-11-24 ENCOUNTER — Other Ambulatory Visit: Payer: Self-pay

## 2017-11-24 ENCOUNTER — Other Ambulatory Visit (INDEPENDENT_AMBULATORY_CARE_PROVIDER_SITE_OTHER): Payer: Medicare HMO

## 2017-11-24 ENCOUNTER — Ambulatory Visit: Payer: Medicare HMO | Admitting: Internal Medicine

## 2017-11-24 ENCOUNTER — Encounter: Payer: Self-pay | Admitting: Internal Medicine

## 2017-11-24 VITALS — BP 132/70 | HR 72 | Ht 65.0 in | Wt 182.2 lb

## 2017-11-24 DIAGNOSIS — K719 Toxic liver disease, unspecified: Secondary | ICD-10-CM

## 2017-11-24 DIAGNOSIS — R7989 Other specified abnormal findings of blood chemistry: Secondary | ICD-10-CM

## 2017-11-24 DIAGNOSIS — R945 Abnormal results of liver function studies: Secondary | ICD-10-CM | POA: Diagnosis not present

## 2017-11-24 LAB — PROTIME-INR
INR: 1.1 ratio — ABNORMAL HIGH (ref 0.8–1.0)
Prothrombin Time: 11.4 s (ref 9.6–13.1)

## 2017-11-24 LAB — HEPATIC FUNCTION PANEL
ALK PHOS: 233 U/L — AB (ref 39–117)
ALT: 184 U/L — AB (ref 0–53)
AST: 84 U/L — AB (ref 0–37)
Albumin: 3.8 g/dL (ref 3.5–5.2)
BILIRUBIN DIRECT: 1 mg/dL — AB (ref 0.0–0.3)
BILIRUBIN TOTAL: 2.4 mg/dL — AB (ref 0.2–1.2)
TOTAL PROTEIN: 6.6 g/dL (ref 6.0–8.3)

## 2017-11-24 NOTE — Progress Notes (Signed)
Patient ID: Walter Brown, male   DOB: 30-Jul-1941, 77 y.o.   MRN: [id number] HPI: Walter Brown is a 77 year old male with a past medical history of hypertension, hyperlipidemia, sleep apnea, arthritis who is seen in consultation at the request of Dr. Marcha Dutton (ER MD) to evaluate painless jaundice and elevated liver enzymes.  He also sees Dr. Charlett Blake.  He is here alone today.  He reports that over the last 2 weeks he developed dark urine and this led him to be evaluated in the emergency department.  There he was found to have elevated bilirubin at 6.2.  Alkaline phosphatase was in the 300 range and AST and ALT were also elevated.  He had a relatively unremarkable CT scan of the abdomen and pelvis including a normal pancreas and unremarkable right upper quadrant ultrasound.  Bile duct was 4 mm. Decision was made to discharge him home for follow-up with [place].  He reports that he has had absolutely no abdominal pain.  No fevers or chills.  No nausea or vomiting.  No itching.  He noticed dark urine over a 2-week period as well as very light stools.  In the last several days his urine has returned almost to normal and his bowel movements are now a normal color.  He denies bleeding including blood in his stool and melena.  He did have a hemorrhoidectomy performed by Dr. Arvin Collard at [facility] in mid January.  This required some over-the-counter laxatives and stool softeners but he is now off of these medications and his hemorrhoidal pain has improved.  No new medications other than being treated with doxycycline and azithromycin in January after developing a bronchitis versus a pneumonia.  He reports he traveled to [place] in December 2018 where he feels that he contracted a respiratory infection.  He takes simvastatin long-term, sertraline, hydrochlorothiazide, terazosin and aspirin.  No family history of liver disease.  He had a colonoscopy 10 years ago perhaps slightly longer in [facility].  No family history of  colon cancer.  He does drink wine and he is a past smoker.  Past Medical History:  Diagnosis Date  . Anemia    mild  . Arthritis 04/06/2017  . Asthma    childhood  . Basal cell carcinoma    skin- on nose- basal cell (20 yrs ago) forehead 1 year ago  . BPH (benign prostatic hyperplasia)   . Cerumen impaction 08/10/2012  . Chicken pox as child  . Depression 08/01/2012  . [place] measles as a child  . Hyperlipidemia   . Hypertension   . Macular degeneration   . Medicare annual wellness visit, subsequent 11/07/2013  . Otitis externa 08/10/2012  . Preventative health care 08/01/2012  . Right shoulder pain 09/26/2014  . Sleep apnea   . Thrombocytopenia ([place])     Past Surgical History:  Procedure Laterality Date  . [place]     very young, b/l  . EXCISIONAL HEMORRHOIDECTOMY    . HYDROCELE EXCISION / REPAIR  2012   b/l  . SKIN CANCER EXCISION     nose and forehead    Outpatient Medications Prior to Visit  Medication Sig Dispense Refill  . aspirin 81 MG tablet Take 81 mg by mouth daily.    . hydrochlorothiazide (HYDRODIURIL) 25 MG tablet TAKE 1 TABLET BY MOUTH EVERY DAY 90 tablet 0  . Multiple Vitamins-Minerals (MULTIVITAMIN MEN 50+ PO) Take 1 tablet by mouth daily.    . Multiple Vitamins-Minerals (OCUVITE ADULT 50+ PO) Take 1  tablet by mouth daily.    . sertraline (ZOLOFT) 50 MG tablet TAKE 1 TABLET BY MOUTH EVERY DAY 90 tablet 0  . simvastatin (ZOCOR) 20 MG tablet TAKE 1 TABLET (20 MG TOTAL) BY MOUTH EVERY EVENING. (Patient taking differently: Take 10 mg by mouth every evening. ) 90 tablet 0  . terazosin (HYTRIN) 5 MG capsule Take 5 mg by mouth at bedtime.     Marland Kitchen albuterol (PROVENTIL HFA;VENTOLIN HFA) 108 (90 Base) MCG/ACT inhaler Inhale 2 puffs into the lungs every 6 (six) hours as needed for wheezing or shortness of breath. 1 Inhaler 1   No facility-administered medications prior to visit.     No Known Allergies  Family History  Problem Relation Age of Onset   . Hypertension Mother   . Other Mother        MRSA  . Heart disease Mother        stents  . Parkinsonism Father   . Heart disease Father   . Hypertension Father   . Prostate cancer Father 16  . Prostate cancer Maternal Grandfather   . ADD / ADHD Son        ADHD  . Other Son 24       part of 1 lung removed- due to infection  . Stroke Maternal Grandmother   . Cancer Paternal Grandfather        EYE    Social History   Tobacco Use  . Smoking status: Former Smoker    Packs/day: 1.50    Years: 15.00    Pack years: 22.50    Start date: 10/07/1978  . Smokeless tobacco: Never Used  Substance Use Topics  . Alcohol use: Yes    Comment: 2 glasses of wine daily  . Drug use: No    ROS: As per history of present illness, otherwise negative  BP 132/70 (BP Location: Left Arm, Patient Position: Sitting, Cuff Size: Normal)   Pulse 72   Ht 5\' 5"  (1.651 m) Comment: height measured without shoes  Wt 182 lb 4 oz (82.7 kg)   BMI 30.33 kg/m  Constitutional: Well-developed and well-nourished. No distress. HEENT: Normocephalic and atraumatic. Oropharynx is clear and moist. Conjunctivae are normal.  No scleral icterus. Neck: Neck supple. Trachea midline. Cardiovascular: Normal rate, regular rhythm and intact distal pulses. No M/R/G Pulmonary/chest: Effort normal and breath sounds normal. No wheezing, rales or rhonchi. Abdominal: Soft, nontender, nondistended. Bowel sounds active throughout. There are no masses palpable. No hepatosplenomegaly. Extremities: no clubbing, cyanosis, or edema Neurological: Alert and oriented to person place and time. NO asterixis Skin: Skin is warm and dry.  Psychiatric: Normal mood and affect. Behavior is normal.  RELEVANT LABS AND IMAGING: Results for GIVANNI, STARON (MRN [id number]) as of 11/24/2017 12:20  Ref. Range 04/04/2017 10:32 10/14/2017 12:47 11/18/2017 13:50 11/19/2017 17:05 11/24/2017 09:49  Alkaline Phosphatase Latest Ref Range: 39 - 117 U/L 54 47 333  (H) 325 (H) 233 (H)  Albumin Latest Ref Range: 3.5 - 5.2 g/dL 4.1 4.3 3.9 3.5 3.8  Lipase Latest Ref Range: 11 - 51 U/L    50   AST Latest Ref Range: 0 - 37 U/L 19 18 122 (H) 178 (H) 84 (H)  ALT Latest Ref Range: 0 - 53 U/L 15 13 282 (H) 321 (H) 184 (H)  Total Protein Latest Ref Range: 6.0 - 8.3 g/dL 6.9 6.7 6.8 6.2 (L) 6.6  Bilirubin, Direct Latest Ref Range: 0.0 - 0.3 mg/dL    2.1 (H) 1.0 (H)  Indirect Bilirubin Latest Ref Range: 0.3 - 0.9 mg/dL    2.3 (H)   Total Bilirubin Latest Ref Range: 0.2 - 1.2 mg/dL 0.7 0.8 6.2 (H) 4.4 (H) 2.4 (H)    CBC    Component Value Date/Time   WBC 8.0 11/19/2017 1705   RBC 4.36 11/19/2017 1705   HGB 13.4 11/19/2017 1705   HCT 39.6 11/19/2017 1705   PLT 143 (L) 11/19/2017 1705   MCV 90.8 11/19/2017 1705   MCH 30.7 11/19/2017 1705   MCHC 33.8 11/19/2017 1705   RDW 15.0 11/19/2017 1705   LYMPHSABS 1.9 11/19/2017 1705   MONOABS 0.7 11/19/2017 1705   EOSABS 0.2 11/19/2017 1705   BASOSABS 0.1 11/19/2017 1705    CMP     Component Value Date/Time   NA 136 11/19/2017 1705   K 3.2 (L) 11/19/2017 1705   CL 101 11/19/2017 1705   CO2 24 11/19/2017 1705   GLUCOSE 118 (H) 11/19/2017 1705   BUN 19 11/19/2017 1705   CREATININE 1.10 11/19/2017 1705   CREATININE 1.13 02/22/2014 1006   CALCIUM 9.1 11/19/2017 1705   PROT 6.6 11/24/2017 0949   ALBUMIN 3.8 11/24/2017 0949   AST 84 (H) 11/24/2017 0949   ALT 184 (H) 11/24/2017 0949   ALKPHOS 233 (H) 11/24/2017 0949   BILITOT 2.4 (H) 11/24/2017 0949   GFRNONAA >60 11/19/2017 1705   GFRAA >60 11/19/2017 1705   CT ABDOMEN AND PELVIS WITH CONTRAST   TECHNIQUE: Multidetector CT imaging of the abdomen and pelvis was performed using the standard protocol following bolus administration of intravenous contrast.   CONTRAST:  100 milliliters ISOVUE-300 IOPAMIDOL (ISOVUE-300) INJECTION 61%   COMPARISON:  Lumbar radiographs 10/04/2016.   FINDINGS: Lower chest: Borderline to mild cardiomegaly. No  pericardial effusion. Mild posterior basal segment right lower lobe atelectasis or scarring. Otherwise negative lung bases. No pleural effusion.   Hepatobiliary: Negative liver and gallbladder.   Pancreas: Negative.   Spleen: Negative.   Adrenals/Urinary Tract: Normal adrenal glands.   Bilateral renal enhancement and contrast excretion is symmetric and within normal limits. No nephrolithiasis or hydroureter.   The urinary bladder is described below.   Stomach/Bowel: Negative rectum aside from retained stool. Redundant sigmoid colon tracks into the right upper quadrant the where segments of the sigmoid are intermittently gas distended and decompressed, accounting for much of the right upper quadrant region gas distended bowel on the scout view. Decompressed proximal sigmoid colon then tracks back down into the pelvis and joins the descending colon which is decompressed. The splenic flexure is also redundant, but decompressed. There is retained stool throughout the transverse colon. The hepatic flexure is also redundant, but is decompressed. The cecum appears to beyond a very lax mesentery and is located in the epigastric region. The terminal ileum and ileocecal valve are demonstrated on coronal image 38. The appendix is not identified. No large bowel wall thickening or mesenteric inflammation is evident.   No dilated small bowel. The duodenum and proximal small bowel appear normally rotated. Negative stomach.   No abdominal free air or free fluid.   Vascular/Lymphatic: Duplicated IVC, normal variant. Aortoiliac calcified atherosclerosis. Major arterial structures in the abdomen and pelvis are patent. The portal venous system appears patent.   No lymphadenopathy.   Reproductive: Prostate enlargement with a lobulated appearance of the superior prostate margin pressing on the base of the bladder (sagittal image 94). The urinary bladder is otherwise unremarkable.   Evidence  of right hydrocele (series 3, image 80), incompletely visible.  Other: No pelvic free fluid.   Musculoskeletal: Degenerative changes throughout the lumbar spine. No acute or suspicious osseous lesion.   IMPRESSION: 1. Nonspecific prostate enlargement, with a lobulated superior prostate margin pressing on the base of the urinary bladder. Recommend Urology follow-up. 2. The large bowel is highly redundant. The sigmoid colon has several redundant loops in the right upper quadrant which are intermittently gas distended and decompressed. And the cecum is located in the epigastrium. Still, there is no evidence of large bowel volvulus or inflammation at this time. 3. Partially visible right hydrocele, probably postinflammatory. 4. No acute or inflammatory process identified.     Electronically Signed   By: Genevie Ann M.D.   On: 11/19/2017 21:41   ULTRASOUND ABDOMEN LIMITED RIGHT UPPER QUADRANT   COMPARISON:  Abdominal CT earlier this day. Abdominal ultrasound 03/03/2014   FINDINGS: Gallbladder:   Physiologically distended. No gallstones or wall thickening visualized. No sonographic Murphy sign noted by sonographer.   Common bile duct:   Diameter: 4 mm, normal.   Liver:   No focal lesion identified. Mild parenchymal heterogeneity without increased echogenicity to suggest steatosis. Portal vein is patent on color Doppler imaging with normal direction of blood flow towards the liver.   IMPRESSION: Unremarkable right upper quadrant ultrasound.     Electronically Signed   By: Jeb Levering M.D.   On: 11/19/2017 22:43  Viral hep panel negative  ASSESSMENT/PLAN: 77 year old male with a past medical history of hypertension, hyperlipidemia, sleep apnea, arthritis who is seen in consultation at the request of Dr. Marcha Dutton (ER MD) to evaluate painless jaundice and elevated liver enzymes.  1. Elevated LFTs/probable DILI --I recommended we repeat hepatic function panel and INR  today.  He reports improvement in dark urine and light-colored stools and therefore I expect his liver enzymes are improving.   initially there was concern for a malignant biliary obstruction with painless jaundice however his CT scan and abdominal ultrasound were unremarkable.  This is very reassuring.  Based on these findings and improving liver enzymes I suspect he had drug-induced liver injury from antibiotics, most likely doxycycline versus azithromycin.  If liver enzymes continue to trend down no further intervention needed.  If they fail to normalize or increase again I would recommend MRI with MRCP. --Avoid alcohol for now --Monitor liver enzymes --No evidence of decompensated liver disease or cirrhosis  2.  Colon cancer screening --it has been 10 years and it seems that he is appropriate for colon cancer screening.  We discussed colonoscopy versus Cologuard.  He prefers to discuss this with Dr. Charlett Blake when he sees her later this year for his annual physical.   ON:GEXBM, Bonnita Levan, Md [place], [place] [address]   Dr. Marcha Dutton

## 2017-11-24 NOTE — Patient Instructions (Signed)
Your physician has requested that you go to the basement for the following lab work before leaving today: Hepatic function, INR  If you are age 77 or older, your body mass index should be between 23-30. Your Body mass index is 30.33 kg/m. If this is out of the aforementioned range listed, please consider follow up with your Primary Care Provider.  If you are age 9 or younger, your body mass index should be between 19-25. Your Body mass index is 30.33 kg/m. If this is out of the aformentioned range listed, please consider follow up with your Primary Care Provider.

## 2017-11-26 DIAGNOSIS — H353132 Nonexudative age-related macular degeneration, bilateral, intermediate dry stage: Secondary | ICD-10-CM | POA: Diagnosis not present

## 2017-11-26 DIAGNOSIS — H35372 Puckering of macula, left eye: Secondary | ICD-10-CM | POA: Diagnosis not present

## 2017-11-26 DIAGNOSIS — H4322 Crystalline deposits in vitreous body, left eye: Secondary | ICD-10-CM | POA: Diagnosis not present

## 2017-11-26 DIAGNOSIS — H43812 Vitreous degeneration, left eye: Secondary | ICD-10-CM | POA: Diagnosis not present

## 2017-12-01 ENCOUNTER — Other Ambulatory Visit (INDEPENDENT_AMBULATORY_CARE_PROVIDER_SITE_OTHER): Payer: Medicare HMO

## 2017-12-01 ENCOUNTER — Other Ambulatory Visit: Payer: Self-pay

## 2017-12-01 ENCOUNTER — Encounter: Payer: Self-pay | Admitting: Internal Medicine

## 2017-12-01 DIAGNOSIS — R7989 Other specified abnormal findings of blood chemistry: Secondary | ICD-10-CM

## 2017-12-01 DIAGNOSIS — R945 Abnormal results of liver function studies: Principal | ICD-10-CM

## 2017-12-01 LAB — HEPATIC FUNCTION PANEL
ALT: 69 U/L — ABNORMAL HIGH (ref 0–53)
AST: 33 U/L (ref 0–37)
Albumin: 4 g/dL (ref 3.5–5.2)
Alkaline Phosphatase: 170 U/L — ABNORMAL HIGH (ref 39–117)
BILIRUBIN TOTAL: 1.4 mg/dL — AB (ref 0.2–1.2)
Bilirubin, Direct: 0.7 mg/dL — ABNORMAL HIGH (ref 0.0–0.3)
Total Protein: 6.8 g/dL (ref 6.0–8.3)

## 2017-12-01 LAB — PROTIME-INR
INR: 1.1 ratio — AB (ref 0.8–1.0)
PROTHROMBIN TIME: 11.6 s (ref 9.6–13.1)

## 2017-12-08 ENCOUNTER — Encounter: Payer: Self-pay | Admitting: Family Medicine

## 2017-12-08 ENCOUNTER — Ambulatory Visit (INDEPENDENT_AMBULATORY_CARE_PROVIDER_SITE_OTHER): Payer: Medicare HMO | Admitting: Family Medicine

## 2017-12-08 VITALS — BP 98/62 | HR 62 | Temp 97.9°F | Resp 18 | Wt 179.6 lb

## 2017-12-08 DIAGNOSIS — R945 Abnormal results of liver function studies: Secondary | ICD-10-CM | POA: Diagnosis not present

## 2017-12-08 DIAGNOSIS — E785 Hyperlipidemia, unspecified: Secondary | ICD-10-CM

## 2017-12-08 DIAGNOSIS — I10 Essential (primary) hypertension: Secondary | ICD-10-CM

## 2017-12-08 DIAGNOSIS — R7989 Other specified abnormal findings of blood chemistry: Secondary | ICD-10-CM

## 2017-12-08 DIAGNOSIS — R17 Unspecified jaundice: Secondary | ICD-10-CM | POA: Diagnosis not present

## 2017-12-08 NOTE — Assessment & Plan Note (Signed)
Has recently started his statin by taking 1/2 tab daily will check lipid panel with next check

## 2017-12-08 NOTE — Assessment & Plan Note (Signed)
With fast, significant increase in Bili, alk phos and ast, alt. No pain was ever associated and on repeat his labs are improving. Repeat cmp today. Likely a medicaiton induced hepatitis, likely secondary to antibiotics he took. One was Azithromycin which has been added to his allergy list and we are having him sign a release of records to get the name of the second antibiotic he took to evaluate whether that also played a role. 

## 2017-12-08 NOTE — Patient Instructions (Signed)
Jaundice, Adult Jaundice is a yellowish discoloration of the skin, the whites of the eyes, and mucous membranes. Jaundice can be a sign that the liver or the bile system is not working normally. What are the causes? This condition is caused by an increased level of bilirubin in the blood. Bilirubin is a substance that is produced by the normal breakdown of red blood cells. Conditions and activities that can cause an increase in the bilirubin level include:  Viral hepatitis.  Gallstones or other conditions, such as a tumor, that can cause a blockage of bile ducts.  Excessive use of alcohol.  Other liver diseases, such as cirrhosis.  Certain cancers.  Certain infections.  Certain genetic syndromes.  Certain medicines.  What are the signs or symptoms? Symptoms of this condition include:  Yellow color to the skin, the whites of the eyes, or mucous membranes.  Dark brown urine.  Stomach pain.  Light or clay-colored stool.  Itchy skin (pruritus).  How is this diagnosed? This condition is diagnosed with a medical history, physical exam, and blood tests. You may have additional tests to determine what is causing your bilirubin level to increase. How is this treated? Treatment for jaundice depends on the underlying condition. Treatment may include:  Stopping the use of a certain medicine.  Fluids that are given through an IV tube that is inserted into one of your veins.  Medicines to treat pruritus.  Surgery, if there is blockage of the bile ducts.  Follow these instructions at home:  Drink enough fluid to keep your urine clear or pale yellow.  Do not drink alcohol.  Take medicines only as directed by your health care provider.  Keep all follow-up visits as directed by your health care provider. This is important.  You may use skin lotion to relieve itching. Contact a health care provider if:  You have a fever. Get help right away if:  Your symptoms suddenly get  worse.  You have symptoms for more than 72 hours.  Your pain gets worse.  You vomit repeatedly.  You become weak or confused.  You develop a severe headache.  You become severely dehydrated. Signs of severe dehydration include: ? A very dry mouth. ? A rapid, weak pulse. ? Rapid breathing. ? Blue lips. This information is not intended to replace advice given to you by your health care provider. Make sure you discuss any questions you have with your health care provider. Document Released: 09/23/2005 Document Revised: 02/29/2016 Document Reviewed: 09/19/2014 Elsevier Interactive Patient Education  [employer].

## 2017-12-08 NOTE — Progress Notes (Signed)
Subjective:  I acted as a Education administrator for Dr. Charlett Blake. [name], [place]  Patient ID: Walter Brown, male    DOB: Mar 25, 1941, 77 y.o.   MRN: [id number]  No chief complaint on file.   HPI  Patient is in today for an acute visit and he feels well. No recent febrile illness or abdominal pain. He was sesnt to the ER for a sudden and dramatic increase in his bilirubin and liver enzymes. No associated symptoms were noted but unknown to [place] he had taken 2 antibiotics just before this that he got at an urgent care for a bronchitis. He feels improved in this regard. Denies CP/palp/SOB/HA/congestion/fevers. Taking meds as prescribed. His pale stool have returned to normal and his orange urine has also returned to normal  Patient Care Team: [name], MD as PCP - General (Family Medicine) Renda Rolls, Jennefer Bravo, MD as Referring Physician (Dermatology) Luberta Mutter, MD as Consulting Physician (Ophthalmology) Ardis Hughs, MD as Consulting Physician (Urology)   Past Medical History:  Diagnosis Date  . Anemia    mild  . Arthritis 04/06/2017  . Asthma    childhood  . Basal cell carcinoma    skin- on nose- basal cell (20 yrs ago) forehead 1 year ago  . BPH (benign prostatic hyperplasia)   . Cerumen impaction 08/10/2012  . Chicken pox as child  . Depression 08/01/2012  . [place] measles as a child  . Hyperlipidemia   . Hypertension   . Macular degeneration   . Medicare annual wellness visit, subsequent 11/07/2013  . Otitis externa 08/10/2012  . Preventative health care 08/01/2012  . Right shoulder pain 09/26/2014  . Sleep apnea   . Thrombocytopenia ([place])     Past Surgical History:  Procedure Laterality Date  . [place]     very young, b/l  . EXCISIONAL HEMORRHOIDECTOMY    . HYDROCELE EXCISION / REPAIR  2012   b/l  . SKIN CANCER EXCISION     nose and forehead    Family History  Problem Relation Age of Onset  . Hypertension Mother   . Other Mother        MRSA  .  Heart disease Mother        stents  . Parkinsonism Father   . Heart disease Father   . Hypertension Father   . Prostate cancer Father 23  . Prostate cancer Maternal Grandfather   . ADD / ADHD Son        ADHD  . Other Son 24       part of 1 lung removed- due to infection  . Stroke Maternal Grandmother   . Cancer Paternal Grandfather        EYE    Social History   Socioeconomic History  . Marital status: Married    Spouse name: Not on file  . Number of children: 2  . Years of education: Not on file  . Highest education level: Not on file  Social Needs  . Financial resource strain: Not on file  . Food insecurity - worry: Not on file  . Food insecurity - inability: Not on file  . Transportation needs - medical: Not on file  . Transportation needs - non-medical: Not on file  Occupational History  . Occupation: retired  Tobacco Use  . Smoking status: Former Smoker    Packs/day: 1.50    Years: 15.00    Pack years: 22.50    Start date: 10/07/1978  . Smokeless tobacco: Never  Used  Substance and Sexual Activity  . Alcohol use: Yes    Comment: 2 glasses of wine daily  . Drug use: No  . 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  Other Topics Concern  . Not on file  Social History Narrative  . Not on file    Outpatient Medications Prior to Visit  Medication Sig Dispense Refill  . aspirin 81 MG tablet Take 81 mg by mouth daily.    . hydrochlorothiazide (HYDRODIURIL) 25 MG tablet TAKE 1 TABLET BY MOUTH EVERY DAY 90 tablet 0  . Multiple Vitamins-Minerals (MULTIVITAMIN MEN 50+ PO) Take 1 tablet by mouth daily.    . Multiple Vitamins-Minerals (OCUVITE ADULT 50+ PO) Take 1 tablet by mouth daily.    . sertraline (ZOLOFT) 50 MG tablet TAKE 1 TABLET BY MOUTH EVERY DAY 90 tablet 0  . simvastatin (ZOCOR) 20 MG tablet TAKE 1 TABLET (20 MG TOTAL) BY MOUTH EVERY EVENING. (Patient taking  differently: Take 10 mg by mouth every evening. ) 90 tablet 0  . terazosin (HYTRIN) 5 MG capsule Take 5 mg by mouth at bedtime.      No facility-administered medications prior to visit.     Allergies  Allergen Reactions  . Azithromycin Other (See Comments)    Hepatotoxicity    Review of Systems  Constitutional: Negative for fever and malaise/fatigue.  HENT: Negative for congestion.   Eyes: Negative for blurred vision.  Respiratory: Negative for shortness of breath.   Cardiovascular: Negative for chest pain, palpitations and leg swelling.  Gastrointestinal: Negative for abdominal pain, blood in stool and nausea.  Genitourinary: Negative for dysuria and frequency.  Musculoskeletal: Negative for falls.  Skin: Negative for rash.  Neurological: Negative for dizziness, loss of consciousness and headaches.  Endo/Heme/Allergies: Negative for environmental allergies.  Psychiatric/Behavioral: Negative for depression. The patient is not nervous/anxious.        Objective:    Physical Exam  Constitutional: He is oriented to person, place, and time. He appears well-developed and well-nourished. No distress.  HENT:  Head: Normocephalic and atraumatic.  Nose: Nose normal.  Eyes: Right eye exhibits no discharge. Left eye exhibits no discharge.  Neck: Normal range of motion. Neck supple.  Cardiovascular: Normal rate and regular rhythm.  No murmur heard. Pulmonary/Chest: Effort normal and breath sounds normal.  Abdominal: Soft. Bowel sounds are normal. There is no tenderness.  Musculoskeletal: He exhibits no edema.  Neurological: He is alert and oriented to person, place, and time.  Skin: Skin is warm and dry.  Psychiatric: He has a normal mood and affect.  Nursing note and vitals reviewed.   BP 98/62 (BP Location: Left Arm, Patient Position: Sitting, Cuff Size: Normal)   Pulse 62   Temp 97.9 F (36.6 C) (Oral)   Resp 18   Wt 179 lb 9.6 oz (81.5 kg)   SpO2 97%   BMI 29.89 kg/m    Wt Readings from Last 3 Encounters:  12/08/17 179 lb 9.6 oz (81.5 kg)  11/24/17 182 lb 4 oz (82.7 kg)  10/14/17 183 lb 12.8 oz (83.4 kg)   BP Readings from Last 3 Encounters:  12/08/17 98/62  11/24/17 132/70  11/19/17 (!) 147/77     Immunization History  Administered Date(s) Administered  . Influenza Split 07/02/2012  . Influenza Whole 06/07/2013  . Influenza,inj,Quad PF,6+ Mos 06/21/2014  . Influenza-Unspecified 06/07/2016, 07/08/2017  . Pneumococcal Conjugate-13 11/04/2013  . Pneumococcal  Polysaccharide-23 10/08/2007  . Tdap 11/04/2013  . Zoster 10/07/1998    Health Maintenance  Topic Date Due  . TETANUS/TDAP  11/05/2023  . INFLUENZA VACCINE  Completed  . PNA vac Low Risk Adult  Completed    Lab Results  Component Value Date   WBC 8.0 11/19/2017   HGB 13.4 11/19/2017   HCT 39.6 11/19/2017   PLT 143 (L) 11/19/2017   GLUCOSE 118 (H) 11/19/2017   CHOL 134 10/14/2017   TRIG 85.0 10/14/2017   HDL 56.70 10/14/2017   LDLCALC 60 10/14/2017   ALT 69 (H) 12/01/2017   AST 33 12/01/2017   NA 136 11/19/2017   K 3.2 (L) 11/19/2017   CL 101 11/19/2017   CREATININE 1.10 11/19/2017   BUN 19 11/19/2017   CO2 24 11/19/2017   TSH 1.69 10/14/2017   INR 1.1 (H) 12/01/2017    Lab Results  Component Value Date   TSH 1.69 10/14/2017   Lab Results  Component Value Date   WBC 8.0 11/19/2017   HGB 13.4 11/19/2017   HCT 39.6 11/19/2017   MCV 90.8 11/19/2017   PLT 143 (L) 11/19/2017   Lab Results  Component Value Date   NA 136 11/19/2017   K 3.2 (L) 11/19/2017   CO2 24 11/19/2017   GLUCOSE 118 (H) 11/19/2017   BUN 19 11/19/2017   CREATININE 1.10 11/19/2017   BILITOT 1.4 (H) 12/01/2017   ALKPHOS 170 (H) 12/01/2017   AST 33 12/01/2017   ALT 69 (H) 12/01/2017   PROT 6.8 12/01/2017   ALBUMIN 4.0 12/01/2017   CALCIUM 9.1 11/19/2017   ANIONGAP 11 11/19/2017   GFR 69.79 11/18/2017   Lab Results  Component Value Date   CHOL 134 10/14/2017   Lab Results  Component  Value Date   HDL 56.70 10/14/2017   Lab Results  Component Value Date   LDLCALC 60 10/14/2017   Lab Results  Component Value Date   TRIG 85.0 10/14/2017   Lab Results  Component Value Date   CHOLHDL 2 10/14/2017   No results found for: HGBA1C       Assessment & Plan:   Problem List Items Addressed This Visit    Hyperlipidemia    Has recently started his statin by taking 1/2 tab daily will check lipid panel with next check      Hypertension    Well controlled, no changes to meds. Encouraged heart healthy diet such as the DASH diet and exercise as tolerated.       Elevated liver function tests    With fast, significant increase in Bili, alk phos and ast, alt. No pain was ever associated and on repeat his labs are improving. Repeat cmp today. Likely a medicaiton induced hepatitis, likely secondary to antibiotics he took. One was Azithromycin which has been added to his allergy list and we are having him sign a release of records to get the name of the second antibiotic he took to evaluate whether that also played a role.       Other Visit Diagnoses    Elevated bilirubin    -  Primary   Relevant Orders   Comprehensive metabolic panel      I am having Walter Brown maintain his terazosin, aspirin, Multiple Vitamins-Minerals (MULTIVITAMIN MEN 50+ PO), sertraline, hydrochlorothiazide, simvastatin, and Multiple Vitamins-Minerals (OCUVITE ADULT 50+ PO).  No orders of the defined types were placed in this encounter.   CMA served as Education administrator during this visit. History, Physical and Plan  performed by medical provider. Documentation and orders reviewed and attested to.  Penni Homans, MD

## 2017-12-08 NOTE — Assessment & Plan Note (Signed)
Well controlled, no changes to meds. Encouraged heart healthy diet such as the DASH diet and exercise as tolerated.  °

## 2017-12-09 LAB — COMPREHENSIVE METABOLIC PANEL
ALBUMIN: 4 g/dL (ref 3.5–5.2)
ALK PHOS: 122 U/L — AB (ref 39–117)
ALT: 30 U/L (ref 0–53)
AST: 24 U/L (ref 0–37)
BILIRUBIN TOTAL: 1.1 mg/dL (ref 0.2–1.2)
BUN: 16 mg/dL (ref 6–23)
CALCIUM: 9.3 mg/dL (ref 8.4–10.5)
CO2: 30 mEq/L (ref 19–32)
Chloride: 103 mEq/L (ref 96–112)
Creatinine, Ser: 1.09 mg/dL (ref 0.40–1.50)
GFR: 69.78 mL/min (ref >60.00)
Glucose, Bld: 71 mg/dL (ref 70–99)
Potassium: 3.8 mEq/L (ref 3.5–5.1)
Sodium: 138 mEq/L (ref 135–145)
TOTAL PROTEIN: 6.8 g/dL (ref 6.0–8.3)

## 2017-12-15 ENCOUNTER — Other Ambulatory Visit (INDEPENDENT_AMBULATORY_CARE_PROVIDER_SITE_OTHER): Payer: Medicare HMO

## 2017-12-15 DIAGNOSIS — R945 Abnormal results of liver function studies: Secondary | ICD-10-CM

## 2017-12-15 DIAGNOSIS — H02413 Mechanical ptosis of bilateral eyelids: Secondary | ICD-10-CM | POA: Diagnosis not present

## 2017-12-15 DIAGNOSIS — H02021 Mechanical entropion of right upper eyelid: Secondary | ICD-10-CM | POA: Diagnosis not present

## 2017-12-15 DIAGNOSIS — R7989 Other specified abnormal findings of blood chemistry: Secondary | ICD-10-CM

## 2017-12-15 DIAGNOSIS — Q100 Congenital ptosis: Secondary | ICD-10-CM | POA: Diagnosis not present

## 2017-12-15 DIAGNOSIS — H02024 Mechanical entropion of left upper eyelid: Secondary | ICD-10-CM | POA: Diagnosis not present

## 2017-12-15 LAB — HEPATIC FUNCTION PANEL
ALT: 19 U/L (ref 0–53)
AST: 18 U/L (ref 0–37)
Albumin: 4.1 g/dL (ref 3.5–5.2)
Alkaline Phosphatase: 91 U/L (ref 39–117)
BILIRUBIN TOTAL: 1.1 mg/dL (ref 0.2–1.2)
Bilirubin, Direct: 0.4 mg/dL — ABNORMAL HIGH (ref 0.0–0.3)
Total Protein: 6.8 g/dL (ref 6.0–8.3)

## 2017-12-15 LAB — PROTIME-INR
INR: 1 ratio (ref 0.8–1.0)
PROTHROMBIN TIME: 11.3 s (ref 9.6–13.1)

## 2018-01-05 DIAGNOSIS — H25813 Combined forms of age-related cataract, bilateral: Secondary | ICD-10-CM | POA: Diagnosis not present

## 2018-01-05 DIAGNOSIS — H35372 Puckering of macula, left eye: Secondary | ICD-10-CM | POA: Diagnosis not present

## 2018-01-05 DIAGNOSIS — H52203 Unspecified astigmatism, bilateral: Secondary | ICD-10-CM | POA: Diagnosis not present

## 2018-01-05 DIAGNOSIS — H353122 Nonexudative age-related macular degeneration, left eye, intermediate dry stage: Secondary | ICD-10-CM | POA: Diagnosis not present

## 2018-01-11 ENCOUNTER — Other Ambulatory Visit: Payer: Self-pay | Admitting: Family Medicine

## 2018-02-01 ENCOUNTER — Other Ambulatory Visit: Payer: Self-pay | Admitting: Family Medicine

## 2018-02-27 DIAGNOSIS — R55 Syncope and collapse: Secondary | ICD-10-CM | POA: Diagnosis not present

## 2018-02-27 DIAGNOSIS — R918 Other nonspecific abnormal finding of lung field: Secondary | ICD-10-CM | POA: Diagnosis not present

## 2018-02-27 DIAGNOSIS — H532 Diplopia: Secondary | ICD-10-CM | POA: Diagnosis not present

## 2018-02-27 DIAGNOSIS — E785 Hyperlipidemia, unspecified: Secondary | ICD-10-CM | POA: Diagnosis not present

## 2018-02-27 DIAGNOSIS — I6789 Other cerebrovascular disease: Secondary | ICD-10-CM | POA: Diagnosis not present

## 2018-02-27 DIAGNOSIS — R2689 Other abnormalities of gait and mobility: Secondary | ICD-10-CM | POA: Diagnosis not present

## 2018-02-27 DIAGNOSIS — Z7982 Long term (current) use of aspirin: Secondary | ICD-10-CM | POA: Diagnosis not present

## 2018-02-27 DIAGNOSIS — I672 Cerebral atherosclerosis: Secondary | ICD-10-CM | POA: Diagnosis not present

## 2018-02-27 DIAGNOSIS — R001 Bradycardia, unspecified: Secondary | ICD-10-CM | POA: Diagnosis not present

## 2018-02-27 DIAGNOSIS — H538 Other visual disturbances: Secondary | ICD-10-CM | POA: Diagnosis not present

## 2018-02-27 DIAGNOSIS — Z79899 Other long term (current) drug therapy: Secondary | ICD-10-CM | POA: Diagnosis not present

## 2018-02-27 DIAGNOSIS — I10 Essential (primary) hypertension: Secondary | ICD-10-CM | POA: Diagnosis not present

## 2018-02-27 DIAGNOSIS — R42 Dizziness and giddiness: Secondary | ICD-10-CM | POA: Diagnosis not present

## 2018-02-27 DIAGNOSIS — G459 Transient cerebral ischemic attack, unspecified: Secondary | ICD-10-CM | POA: Diagnosis not present

## 2018-02-27 DIAGNOSIS — R69 Illness, unspecified: Secondary | ICD-10-CM | POA: Diagnosis not present

## 2018-02-28 DIAGNOSIS — G459 Transient cerebral ischemic attack, unspecified: Secondary | ICD-10-CM | POA: Diagnosis not present

## 2018-02-28 DIAGNOSIS — R55 Syncope and collapse: Secondary | ICD-10-CM | POA: Diagnosis not present

## 2018-02-28 DIAGNOSIS — R001 Bradycardia, unspecified: Secondary | ICD-10-CM | POA: Diagnosis not present

## 2018-02-28 DIAGNOSIS — G9389 Other specified disorders of brain: Secondary | ICD-10-CM | POA: Diagnosis not present

## 2018-03-01 ENCOUNTER — Encounter: Payer: Self-pay | Admitting: Family Medicine

## 2018-03-03 NOTE — Telephone Encounter (Signed)
Author unable to reach Dr. Charlett Blake before Pryor Curia leaving the office for the day, so author  phoned wife, made appointment per [place], [place] approval for 6/4 at 1130 for hospital follow-up. Wife stated they would make that time. Author advised wife to restrict pt's driving during this time, and if symptoms return or worsen, to be re-evaluated at the ED. Wife verbalized understanding.
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
## 2018-03-05 ENCOUNTER — Telehealth: Payer: Self-pay

## 2018-03-05 NOTE — Telephone Encounter (Signed)
Author phoned pt. regarding concern with heart monitor removal. Pt. Stated he called the zio patch company already and they told him to dry it off and reapply. Pt denies any recurrent episodes of loss of consciousness. Pt scheduled to see Dr. Charlett Blake 6/4, and pt. made aware.   Copied from [place] [id number]. Topic: General - Other >> Mar 03, 2018 12:25 PM Yvette Rack wrote: Reason for CRM: pt wife Wilfred Curtis calling to speak with Raquel Sarna she states that her husbands heart monitor came of and want to know what she should do please call [phone]

## 2018-03-10 ENCOUNTER — Encounter: Payer: Self-pay | Admitting: Family Medicine

## 2018-03-10 ENCOUNTER — Ambulatory Visit (INDEPENDENT_AMBULATORY_CARE_PROVIDER_SITE_OTHER): Payer: Medicare HMO | Admitting: Family Medicine

## 2018-03-10 ENCOUNTER — Encounter

## 2018-03-10 VITALS — BP 126/72 | HR 77 | Resp 16 | Ht 65.0 in | Wt 184.0 lb

## 2018-03-10 DIAGNOSIS — E7800 Pure hypercholesterolemia, unspecified: Secondary | ICD-10-CM | POA: Diagnosis not present

## 2018-03-10 DIAGNOSIS — G459 Transient cerebral ischemic attack, unspecified: Secondary | ICD-10-CM

## 2018-03-10 DIAGNOSIS — K649 Unspecified hemorrhoids: Secondary | ICD-10-CM | POA: Diagnosis not present

## 2018-03-10 DIAGNOSIS — I10 Essential (primary) hypertension: Secondary | ICD-10-CM

## 2018-03-10 DIAGNOSIS — D649 Anemia, unspecified: Secondary | ICD-10-CM | POA: Diagnosis not present

## 2018-03-10 DIAGNOSIS — D696 Thrombocytopenia, unspecified: Secondary | ICD-10-CM

## 2018-03-10 DIAGNOSIS — R4020 Unspecified coma: Secondary | ICD-10-CM | POA: Diagnosis not present

## 2018-03-10 DIAGNOSIS — I499 Cardiac arrhythmia, unspecified: Secondary | ICD-10-CM

## 2018-03-10 HISTORY — DX: Cardiac arrhythmia, unspecified: I49.9

## 2018-03-10 HISTORY — DX: Transient cerebral ischemic attack, unspecified: G45.9

## 2018-03-10 LAB — CBC WITH DIFFERENTIAL/PLATELET
Basophils Absolute: 0.1 10*3/uL (ref 0.0–0.1)
Basophils Relative: 1 % (ref 0.0–3.0)
Eosinophils Absolute: 0.1 10*3/uL (ref 0.0–0.7)
Eosinophils Relative: 0.8 % (ref 0.0–5.0)
HCT: 41.1 % (ref 39.0–52.0)
Hemoglobin: 14.1 g/dL (ref 13.0–17.0)
LYMPHS ABS: 1.8 10*3/uL (ref 0.7–4.0)
Lymphocytes Relative: 25.7 % (ref 12.0–46.0)
MCHC: 34.3 g/dL (ref 30.0–36.0)
MCV: 91.1 fl (ref 78.0–100.0)
MONOS PCT: 8.9 % (ref 3.0–12.0)
Monocytes Absolute: 0.6 10*3/uL (ref 0.1–1.0)
NEUTROS ABS: 4.5 10*3/uL (ref 1.4–7.7)
NEUTROS PCT: 63.6 % (ref 43.0–77.0)
Platelets: 133 10*3/uL — ABNORMAL LOW (ref 150.0–400.0)
RBC: 4.51 Mil/uL (ref 4.22–5.81)
RDW: 13.8 % (ref 11.5–15.5)
WBC: 7.1 10*3/uL (ref 4.0–10.5)

## 2018-03-10 NOTE — Progress Notes (Signed)
Patient ID: Walter Brown, male   DOB: [birth year], 77 y.o.   MRN: [id number]   Subjective:    Patient ID: Walter Brown, male    DOB: 06-Nov-1940, 77 y.o.   MRN: [id number]  Chief Complaint  Patient presents with  . Hospitalization Follow-up    loss of consicousness ,confusion, swerving, slurred speech, normal screenings, wearing heart monitor    HPI Patient is in today for hospital follow up. He Had an episode of confusion, double vision, unsteadiness and syncope while driving to [place] to visit his sister. They were near [facility] so went there and CT and MR of brain showed some cerebral volume loss possibly suggestive of normopressure hydrocephalus. His double vision is improved but he does still  No recurrent syncope or confusion but he does still have some unsteadiness to his gait. No recent febrile illness or other acute concern. Is accompanied by his partner. Denies CP/palp/SOB/HA/congestion/fevers/GI or GU c/o. Taking meds as prescribed  Past Medical History:  Diagnosis Date  . Anemia    mild  . Arthritis 04/06/2017  . Asthma    childhood  . Basal cell carcinoma    skin- on nose- basal cell (20 yrs ago) forehead 1 year ago  . BPH (benign prostatic hyperplasia)   . Cerumen impaction 08/10/2012  . Chicken pox as child  . Depression 08/01/2012  . [place] measles as a child  . Hyperlipidemia   . Hypertension   . Macular degeneration   . Medicare annual wellness visit, subsequent 11/07/2013  . Otitis externa 08/10/2012  . Preventative health care 08/01/2012  . Right shoulder pain 09/26/2014  . Sleep apnea   . Thrombocytopenia ([place])     Past Surgical History:  Procedure Laterality Date  . [place]     very young, b/l  . EXCISIONAL HEMORRHOIDECTOMY    . HYDROCELE EXCISION / REPAIR  2012   b/l  . SKIN CANCER EXCISION     nose and forehead    Family History  Problem Relation Age of Onset  . Hypertension Mother   . Other Mother        MRSA  . Heart disease Mother         stents  . Parkinsonism Father   . Heart disease Father   . Hypertension Father   . Prostate cancer Father 58  . Prostate cancer Maternal Grandfather   . ADD / ADHD Son        ADHD  . Other Son 24       part of 1 lung removed- due to infection  . Stroke Maternal Grandmother   . Cancer Paternal Grandfather        EYE    Social History   Socioeconomic History  . Marital status: Married    Spouse name: Not on file  . Number of children: 2  . Years of education: Not on file  . Highest education level: Not on file  Occupational History  . Occupation: retired  Scientific laboratory technician  . Financial resource strain: Not on file  . Food insecurity:    Worry: Not on file    Inability: Not on file  . Transportation needs:    Medical: Not on file    Non-medical: Not on file  Tobacco Use  . Smoking status: Former Smoker    Packs/day: 1.50    Years: 15.00    Pack years: 22.50    Start date: 10/07/1978  . Smokeless tobacco: Never Used  Substance and Sexual  Activity  . Alcohol use: Yes    Comment: 2 glasses of wine daily  . Drug use: No  . 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  Lifestyle  . Physical activity:    Days per week: Not on file    Minutes per session: Not on file  . Stress: Not on file  Relationships  . Social connections:    Talks on phone: Not on file    Gets together: Not on file    Attends religious service: Not on file    Active member of club or organization: Not on file    Attends meetings of clubs or organizations: Not on file    Relationship status: Not on file  . Intimate partner violence:    Fear of current or ex partner: Not on file    Emotionally abused: Not on file    Physically abused: Not on file    Forced sexual activity: Not on file  Other Topics Concern  . Not on file  Social History Narrative  . Not on file    Outpatient Medications Prior to  Visit  Medication Sig Dispense Refill  . aspirin 81 MG tablet Take 81 mg by mouth daily.    . hydrochlorothiazide (HYDRODIURIL) 25 MG tablet TAKE 1 TABLET BY MOUTH EVERY DAY 90 tablet 0  . Multiple Vitamins-Minerals (MULTIVITAMIN MEN 50+ PO) Take 1 tablet by mouth daily.    . Multiple Vitamins-Minerals (OCUVITE ADULT 50+ PO) Take 1 tablet by mouth daily.    . sertraline (ZOLOFT) 50 MG tablet TAKE 1 TABLET BY MOUTH EVERY DAY 90 tablet 0  . sertraline (ZOLOFT) 50 MG tablet TAKE 1 TABLET (50 MG TOTAL) BY MOUTH DAILY. 90 tablet 0  . simvastatin (ZOCOR) 20 MG tablet TAKE 1 TABLET (20 MG TOTAL) BY MOUTH EVERY EVENING. (Patient taking differently: Take 10 mg by mouth every evening. ) 90 tablet 0  . terazosin (HYTRIN) 5 MG capsule Take 5 mg by mouth at bedtime.      No facility-administered medications prior to visit.     Allergies  Allergen Reactions  . Azithromycin Other (See Comments)    Hepatotoxicity  . Doxycycline Hyclate Other (See Comments)    Taken with Azithromycin and had Heaptotoxicity    Review of Systems  Constitutional: Positive for malaise/fatigue. Negative for fever.  HENT: Negative for congestion.   Eyes: Positive for double vision. Negative for blurred vision.  Respiratory: Negative for shortness of breath.   Cardiovascular: Negative for chest pain, palpitations and leg swelling.  Gastrointestinal: Negative for abdominal pain, blood in stool and nausea.  Genitourinary: Negative for dysuria and frequency.  Musculoskeletal: Negative for falls.  Skin: Negative for rash.  Neurological: Positive for dizziness and loss of consciousness. Negative for speech change, seizures and headaches.  Endo/Heme/Allergies: Negative for environmental allergies.  Psychiatric/Behavioral: Negative for depression. The patient is not nervous/anxious.        Objective:    Physical Exam  Constitutional: He is oriented to person, place, and time. He appears well-developed and well-nourished.  No distress.  HENT:  Head: Normocephalic and atraumatic.  Nose: Nose normal.  Eyes: Right eye exhibits no discharge. Left eye exhibits no discharge.  Neck: Normal range of motion. Neck supple.  Cardiovascular: Normal rate and regular rhythm.  No murmur heard. Pulmonary/Chest: Effort normal and breath sounds normal.  Abdominal: Soft. Bowel sounds are normal. There is no tenderness.  Musculoskeletal: He exhibits no edema.  Neurological: He is alert and oriented to person, place, and time.  Skin: Skin is warm and dry.  Psychiatric: He has a normal mood and affect.  Nursing note and vitals reviewed.   BP 126/72 (BP Location: Right Arm, Patient Position: Sitting, Cuff Size: Normal)   Pulse 77   Resp 16   Ht 5\' 5"  (1.651 m)   Wt 184 lb (83.5 kg)   SpO2 98%   BMI 30.62 kg/m  Wt Readings from Last 3 Encounters:  03/10/18 184 lb (83.5 kg)  12/08/17 179 lb 9.6 oz (81.5 kg)  11/24/17 182 lb 4 oz (82.7 kg)     Lab Results  Component Value Date   WBC 7.1 03/10/2018   HGB 14.1 03/10/2018   HCT 41.1 03/10/2018   PLT 133.0 (L) 03/10/2018   GLUCOSE 71 12/08/2017   CHOL 134 10/14/2017   TRIG 85.0 10/14/2017   HDL 56.70 10/14/2017   LDLCALC 60 10/14/2017   ALT 19 12/15/2017   AST 18 12/15/2017   NA 138 12/08/2017   K 3.8 12/08/2017   CL 103 12/08/2017   CREATININE 1.09 12/08/2017   BUN 16 12/08/2017   CO2 30 12/08/2017   TSH 1.69 10/14/2017   INR 1.0 12/15/2017    Lab Results  Component Value Date   TSH 1.69 10/14/2017   Lab Results  Component Value Date   WBC 7.1 03/10/2018   HGB 14.1 03/10/2018   HCT 41.1 03/10/2018   MCV 91.1 03/10/2018   PLT 133.0 (L) 03/10/2018   Lab Results  Component Value Date   NA 138 12/08/2017   K 3.8 12/08/2017   CO2 30 12/08/2017   GLUCOSE 71 12/08/2017   BUN 16 12/08/2017   CREATININE 1.09 12/08/2017   BILITOT 1.1 12/15/2017   ALKPHOS 91 12/15/2017   AST 18 12/15/2017   ALT 19 12/15/2017   PROT 6.8 12/15/2017   ALBUMIN 4.1  12/15/2017   CALCIUM 9.3 12/08/2017   ANIONGAP 11 11/19/2017   GFR 69.78 12/08/2017   Lab Results  Component Value Date   CHOL 134 10/14/2017   Lab Results  Component Value Date   HDL 56.70 10/14/2017   Lab Results  Component Value Date   LDLCALC 60 10/14/2017   Lab Results  Component Value Date   TRIG 85.0 10/14/2017   Lab Results  Component Value Date   CHOLHDL 2 10/14/2017   No results found for: HGBA1C     Assessment & Plan:   Problem List Items Addressed This Visit    Thrombocytopenia ([place])    Stable repeat CBC       Relevant Orders   Ambulatory referral to Cardiology   Anemia - Primary   Relevant Orders   CBC w/Diff (Completed)   Hyperlipidemia    Tolerating statin, encouraged heart healthy diet, avoid trans fats, minimize simple carbs and saturated fats. Increase exercise as tolerated      Relevant Orders   Ambulatory referral to Cardiology   Hypertension    Well controlled, no changes to meds. Encouraged heart healthy diet such as the DASH diet and exercise as tolerated.       Relevant Orders   Ambulatory referral to Cardiology   Irregular cardiac rhythm    Was recently admitted to [facility] with syncope and TIA. He now has a Zio XT monitor in place for 2 weeks and he is on the second week with out any phone calls. He denies any CP or  sense of palpitations but with his risk factors will set him up with [facility] cardiology consultation to follow up. He will seek care if symptoms develop      Relevant Orders   Ambulatory referral to Cardiology   TIA (transient ischemic attack)    Had an episode of confusion, double vision, unsteadiness and syncope while driving to [place] to visit his sister. They were near [facility] so went there and CT and MR of brain showed some cerebral volume loss possibly suggestive of normopressure hydrocephalus. His double vision is improved but he does still  No recurrent syncope or confusion but he does still have some unsteadiness to his  gait. Will refer to neurology for further consideration of imaging and symptoms      Relevant Orders   Ambulatory referral to Neurology   Ambulatory referral to Cardiology   Hemorrhoids    Patient partner is with him and reports he is having trouble with irritation and leakage no pain or bleeding. Encouraged to clean area daily with [facility] and if no improvement may need referral to GI to evaluate       Other Visit Diagnoses    LOC (loss of consciousness) [facility])       Relevant Orders   Ambulatory referral to Neurology   Ambulatory referral to Cardiology      I am having [place]. Barthelemy maintain his terazosin, aspirin, Multiple Vitamins-Minerals (MULTIVITAMIN MEN 50+ PO), sertraline, simvastatin, Multiple Vitamins-Minerals (OCUVITE ADULT 50+ PO), hydrochlorothiazide, and sertraline.  No orders of the defined types were placed in this encounter.    Penni Homans, MD

## 2018-03-10 NOTE — Assessment & Plan Note (Signed)
Was recently admitted to [facility] with syncope and TIA. He now has a Zio XT monitor in place for 2 weeks and he is on the second week with out any phone calls. He denies any CP or sense of palpitations but with his risk factors will set him up with [facility] cardiology consultation to follow up. He will seek care if symptoms develop

## 2018-03-10 NOTE — Patient Instructions (Signed)
Flonase and Mucinex twice a day Transient Ischemic Attack A transient ischemic attack (TIA) is a "warning stroke" that causes stroke-like symptoms that then go away quickly. The symptoms of a TIA come on suddenly, and they last less than 24 hours. Unlike a stroke, a TIA does not cause permanent damage to the brain. It is important to know the symptoms of a TIA and what to do. Seek medical care right away, even if your symptoms go away. Having a TIA is a sign that you are at higher risk for a permanent stroke. Lifestyle changes and medical treatments can help prevent a stroke. What are the causes? This condition is caused by a temporary blockage in an artery in the head or neck. The blockage does not allow the brain to get the blood supply it needs and can cause various symptoms. The blockage can be caused by:  Fatty buildup in an artery in the head or neck (atherosclerosis).  A blood clot.  Tearing of an artery (dissection).  Inflammation of an artery (vasculitis).  Sometimes the cause is not known. What increases the risk? Certain factors may make you more likely to develop this condition. Some of these factors are things that you can change, such as:  Obesity.  Using products that contain nicotine or tobacco, such as cigarettes and e-cigarettes.  Taking oral birth control, especially if you also use tobacco.  Lack of physical inactivity.  Excessive use of alcohol.  Use of drugs, especially cocaine and methamphetamine.  Other risk factors include:  High blood pressure (hypertension).  High cholesterol.  Diabetes mellitus.  Heart disease (coronary artery disease).  Atrial fibrillation.  Being African American or Hispanic.  Being over the age of 54.  Being male.  Family history of stroke.  Previous history of blood clots, stroke, TIA, or heart attack.  Sickle cell disease.  Being a woman with a history of preeclampsia.  Migraine headache.  Sleep  apnea.  Chronic inflammatory diseases, such as rheumatoid arthritis or lupus.  Blood clotting disorders (hypercoagulable state).  What are the signs or symptoms? Symptoms of this condition are the same as those of a stroke, but they are temporary. The symptoms develop suddenly, and they go away quickly, usually within minutes to hours. Symptoms may include sudden:  Weakness or numbness in your face, arm, or leg, especially on one side of your body.  Trouble walking or difficulty moving your arms or legs.  Trouble speaking, understanding speech, or both (aphasia).  Vision changes in one or both eyes. These include double vision, blurred vision, or loss of vision.  Dizziness.  Confusion.  Loss of balance or coordination.  Nausea and vomiting.  Severe headache with no known cause.  If possible, make note of the exact time that you last felt like your normal self and what time your symptoms started. Tell your health care provider. How is this diagnosed? This condition may be diagnosed based on:  Your symptoms and medical history.  A physical exam.  Imaging tests, usually a CT or MRI scan of the brain.  Blood tests.  You may also have other tests, including:  Electrocardiogram (ECG).  Echocardiogram.  Carotid ultrasound.  A scan of the brain circulation (CT angiogram or MRI angiogram).  Continuous heart monitoring.  How is this treated? The goal of treatment is to reduce the risk for a subsequent stroke. Treatment may include stroke prevention therapies such as:  Changes to diet or lifestyle to decrease your risk. Lifestyle changes may  include exercising and stopping smoking.  Medicines to thin the blood (antiplatelets or anticoagulants).  Blood pressure medicines.  Medicines to reduce cholesterol.  Treating other health conditions, such as diabetes or atrial fibrillation.  If testing shows that you have narrowing in the arteries to your brain, your health  care provider may recommend a procedure, such as:  Carotid endarterectomy. This is a surgery to remove the blockage from your artery.  Carotid angioplasty and stenting. This is a procedure to open or widen an artery in the neck using a metal mesh tube (stent). The stent helps keep the artery open by supporting the artery walls.  Follow these instructions at home: Medicines  Take over-the-counter and prescription medicines only as told by your health care provider.  If you were told to take a medicine to thin your blood, such as aspirin or an anticoagulant, take it exactly as told by your health care provider. ? Taking too much blood-thinning medicine can cause bleeding. ? If you do not take enough blood-thinning medicine, you will not have the protection that you need against a stroke and other problems. Eating and drinking  Eat 5 or more servings of fruits and vegetables each day.  Follow instructions from your health care provider about diet. You may need to follow a certain nutrition plan to help manage risk factors for stroke, such as high blood pressure, high cholesterol, diabetes, or obesity. This may include: ? Eating a low-fat, low-salt diet. ? Including a lot of fiber in your diet. ? Limiting the amount of carbohydrates and sugar in your diet.  Limit alcohol intake to no more than 1 drink a day for nonpregnant women and 2 drinks a day for men. One drink equals 12 oz of beer, 5 oz of wine, or 1 oz of hard liquor. General instructions  Maintain a healthy weight.  Stay physically active. Try to get at least 30 minutes of exercise on most or all days.  Find out if you have sleep apnea, and seek treatment if needed.  Do not use any products that contain nicotine or tobacco, such as cigarettes and e-cigarettes. If you need help quitting, ask your health care provider.  Do not abuse drugs.  Keep all follow-up visits as told by your health care provider. This is  important. Where to find more information:  American Stroke Association: www.strokeassociation.org  National Stroke Association: www.stroke.org Get help right away if:  You have chest pain or an irregular heartbeat.  You have any symptoms of stroke. The acronym BEFAST is an easy way to remember the main warning signs of stroke. ? B = Balance problems. Signs include dizziness, sudden trouble walking, or loss of balance. ? E = Eye problems. This includes trouble seeing or a sudden change in vision. ? F = Face changes. This includes sudden weakness or numbness of the face, or the face or eyelid drooping to one side. ? A = Arm weakness or numbness. This happens suddenly and usually on one side of the body. ? S = Speech problems. This includes trouble speaking or trouble understanding speech. ? T = Time. Time to call 911 or seek emergency care. Do not wait to see if symptoms will go away. Make note of the time your symptoms started.  Other signs of stroke may include: ? A sudden, severe headache with no known cause. ? Nausea or vomiting. ? Seizure. These symptoms may represent a serious problem that is an emergency. Do not wait to see if  the symptoms will go away. Get medical help right away. Call your local emergency services (911 in the [place] Do not drive yourself to the hospital. Summary  A TIA happens when an artery in the head or neck is blocked, leading to stroke-like symptoms that then go away quickly. The blockage clears before there is any permanent brain damage. A TIA is a medical emergency and requires immediate medical attention.  Symptoms of this condition are the same as those of a stroke, but they are temporary. The symptoms usually develop suddenly, and they go away quickly, usually within minutes to hours.  Having a TIA means that you are at high risk of a stroke in the near future.  Treatment may include medicines to thin the blood as well as medicines, diet changes, and  lifestyle changes to manage conditions that increase the risk of another TIA or a stroke. This information is not intended to replace advice given to you by your health care provider. Make sure you discuss any questions you have with your health care provider. Document Released: 07/03/2005 Document Revised: 11/05/2016 Document Reviewed: 11/05/2016 Elsevier Interactive Patient Education  2018 [employer].

## 2018-03-10 NOTE — Assessment & Plan Note (Signed)
Had an episode of confusion, double vision, unsteadiness and syncope while driving to [place] to visit his sister. They were near [facility] so went there and CT and MR of brain showed some cerebral volume loss possibly suggestive of normopressure hydrocephalus. His double vision is improved but he does still  No recurrent syncope or confusion but he does still have some unsteadiness to his gait. Will refer to neurology for further consideration of imaging and symptoms

## 2018-03-10 NOTE — Assessment & Plan Note (Signed)
Patient partner is with him and reports he is having trouble with irritation and leakage no pain or bleeding. Encouraged to clean area daily with [facility] and if no improvement may need referral to GI to evaluate

## 2018-03-10 NOTE — Assessment & Plan Note (Signed)
Well controlled, no changes to meds. Encouraged heart healthy diet such as the DASH diet and exercise as tolerated.  °

## 2018-03-10 NOTE — Assessment & Plan Note (Signed)
Stable repeat CBC

## 2018-03-10 NOTE — Assessment & Plan Note (Signed)
Tolerating statin, encouraged heart healthy diet, avoid trans fats, minimize simple carbs and saturated fats. Increase exercise as tolerated 

## 2018-03-12 ENCOUNTER — Encounter: Payer: Self-pay | Admitting: Family Medicine

## 2018-03-16 DIAGNOSIS — R55 Syncope and collapse: Secondary | ICD-10-CM | POA: Diagnosis not present

## 2018-03-16 IMAGING — CT CT ABD-PELV W/ CM
2 of 5 series · 15 of 46 positions shown, 17 images · IV contrast (iopamidol)
Comparison: Lumbar radiographs 10/04/2016.

CLINICAL DATA: 76-year-old male with abdominal pain and
unintentional weight loss in the past 6-8 weeks.

EXAM:
CT ABDOMEN AND PELVIS WITH CONTRAST
TECHNIQUE: Multidetector CT imaging of the abdomen and pelvis was performed
using the standard protocol following bolus administration of
intravenous contrast.
CONTRAST:  100 milliliters [id number] IOPAMIDOL ([id number])
INJECTION 61%

[Series 3: a/p w/ 5mm · axial · 0.74mm/px · z∈[-250,+130]mm · 12 of 88 slices shown, 14 images]
[im 6/88  soft-tissue]
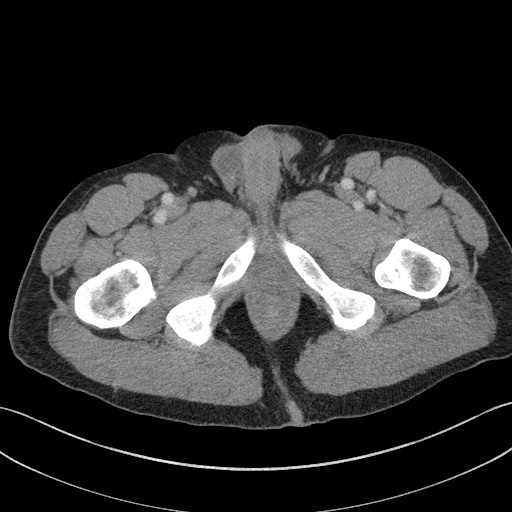
[im 6/88  bone]
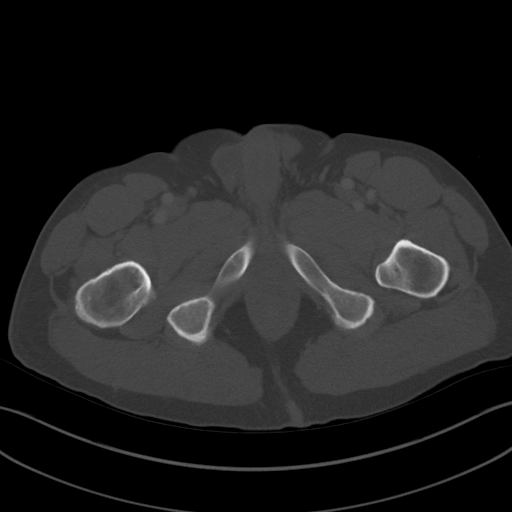
[im 11/88  soft-tissue]
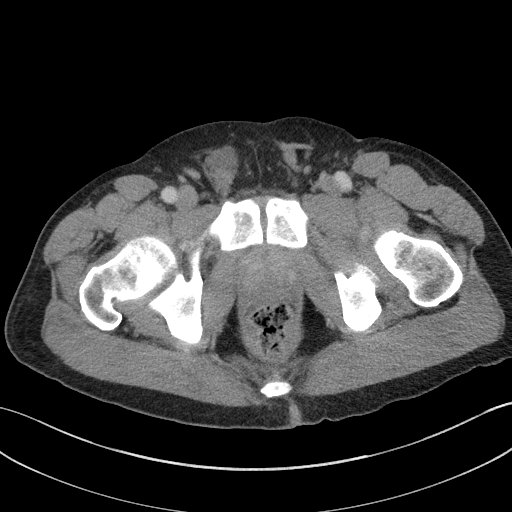
[im 22/88  soft-tissue]
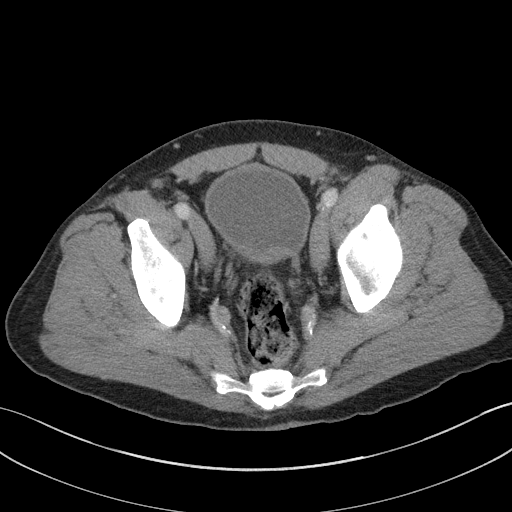
[im 28/88  soft-tissue]
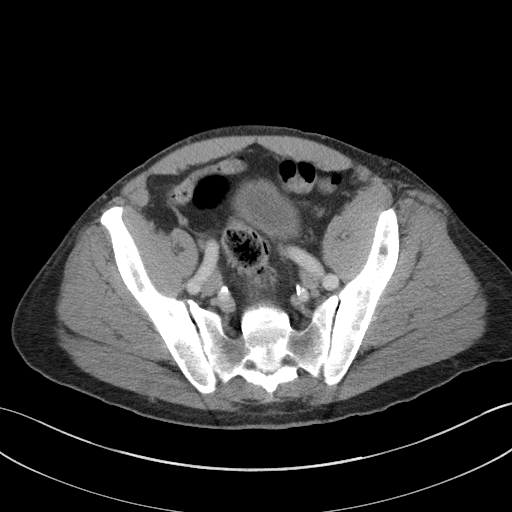
[im 33/88  soft-tissue]
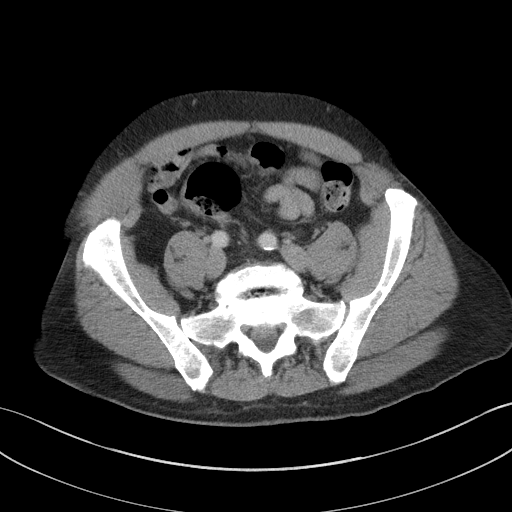
[im 39/88  soft-tissue]
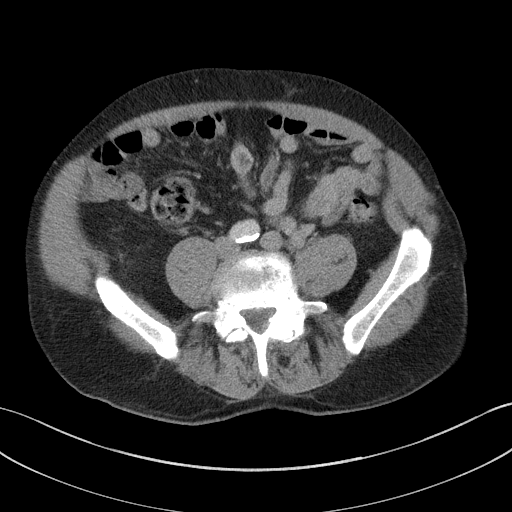
[im 49/88  soft-tissue]
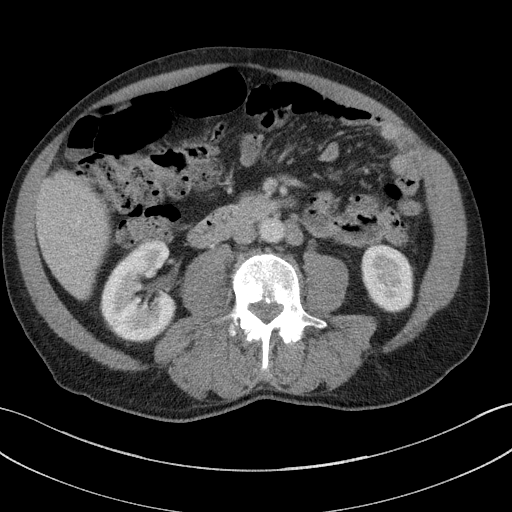
[im 55/88  soft-tissue]
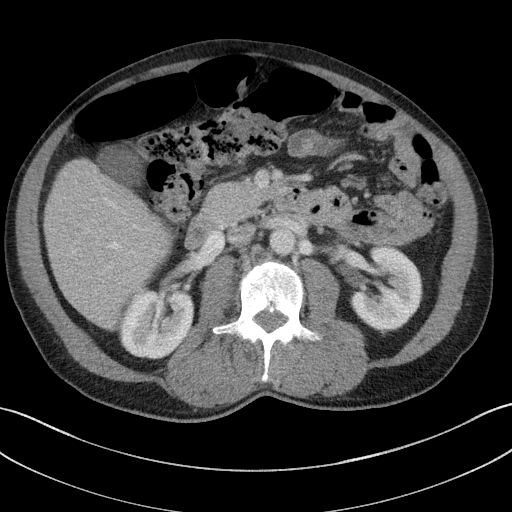
[im 60/88  soft-tissue]
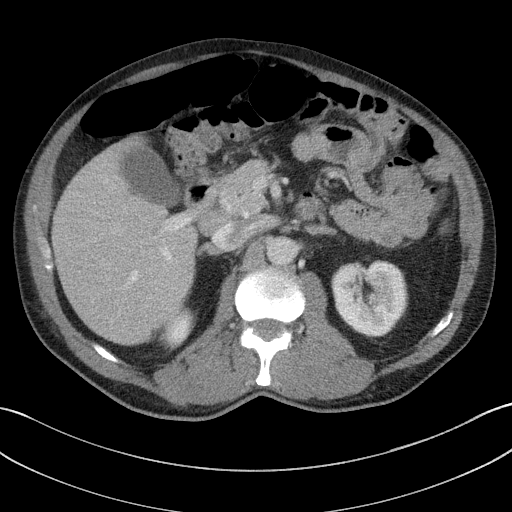
[im 60/88  bone]
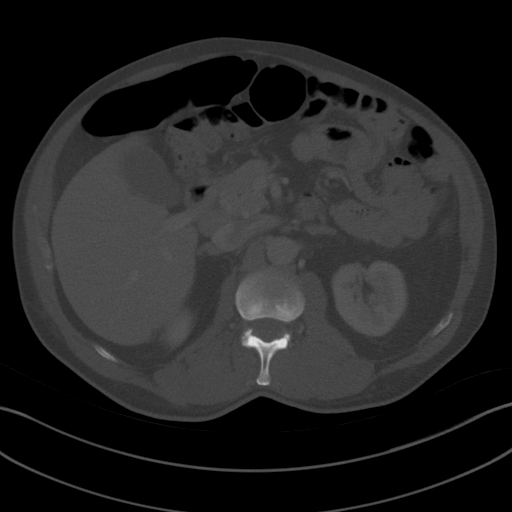
[im 66/88  soft-tissue]
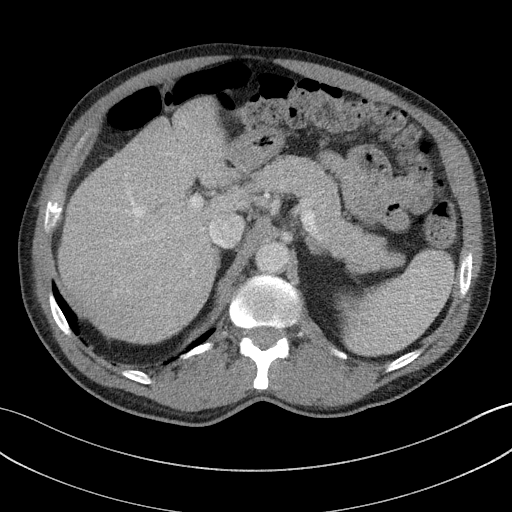
[im 77/88  soft-tissue]
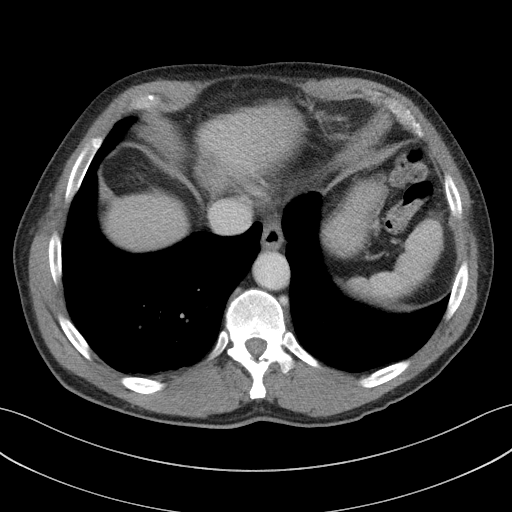
[im 82/88  soft-tissue]
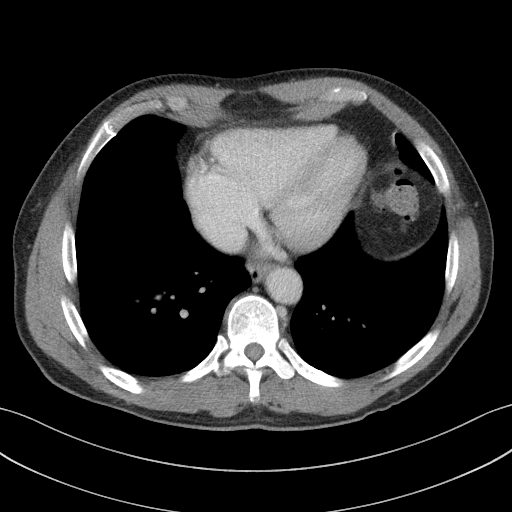

[Series 6: a/p w/ cor · coronal · 0.68mm/px · 3 of 138 slices shown]
[im 46/138  soft-tissue]
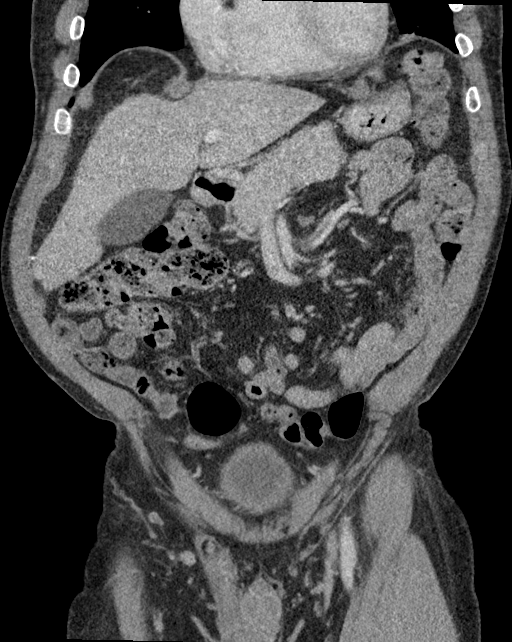
[im 61/138  soft-tissue]
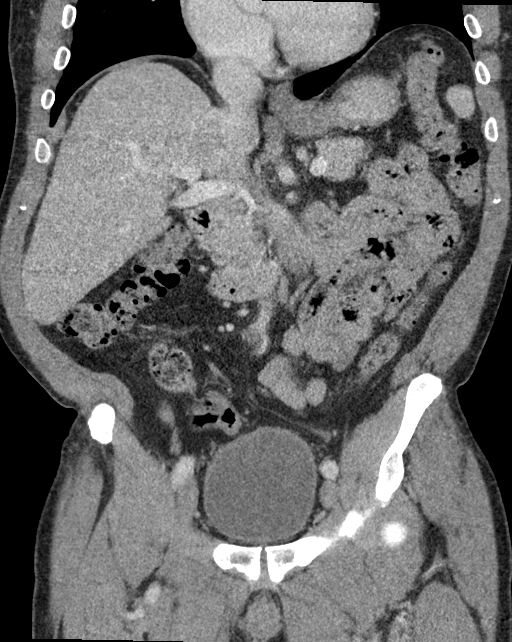
[im 77/138  soft-tissue]
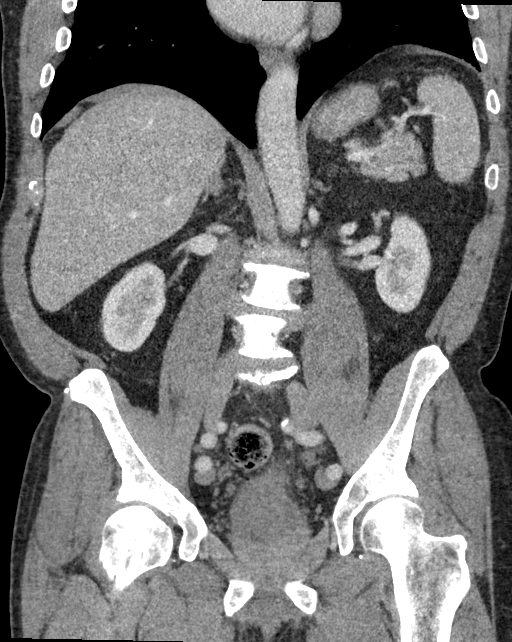

[15 of 46 positions shown; findings below may reference images not displayed]

FINDINGS: Lower chest: Borderline to mild cardiomegaly. No pericardial
effusion. Mild posterior basal segment right lower lobe atelectasis
or scarring. Otherwise negative lung bases. No pleural effusion.

Hepatobiliary: Negative liver and gallbladder.

Pancreas: Negative.

Spleen: Negative.

Adrenals/Urinary Tract: Normal adrenal glands.

Bilateral renal enhancement and contrast excretion is symmetric and
within normal limits. No nephrolithiasis or hydroureter.

The urinary bladder is described below.

Stomach/Bowel: Negative rectum aside from retained stool. Redundant
sigmoid colon tracks into the right upper quadrant the where
segments of the sigmoid are intermittently gas distended and
decompressed, accounting for much of the right upper quadrant region
gas distended bowel on the scout view. Decompressed proximal sigmoid
colon then tracks back down into the pelvis and joins the descending
colon which is decompressed. The splenic flexure is also redundant,
but decompressed. There is retained stool throughout the transverse
colon. The hepatic flexure is also redundant, but is decompressed.
The cecum appears to beyond a very lax mesentery and is located in
the epigastric region. The terminal ileum and ileocecal valve are
demonstrated on coronal image 38. The appendix is not identified. No
large bowel wall thickening or mesenteric inflammation is evident.

No dilated small bowel. The duodenum and proximal small bowel appear
normally rotated. Negative stomach.

No abdominal free air or free fluid.

Vascular/Lymphatic: Duplicated IVC, normal variant. Aortoiliac
calcified atherosclerosis. Major arterial structures in the abdomen
and pelvis are patent. The portal venous system appears patent.

No lymphadenopathy.

Reproductive: Prostate enlargement with a lobulated appearance of
the superior prostate margin pressing on the base of the bladder
(sagittal image 94). The urinary bladder is otherwise unremarkable.

Evidence of right hydrocele (series 3, image 80), incompletely
visible.

Other: No pelvic free fluid.

Musculoskeletal: Degenerative changes throughout the lumbar spine.
No acute or suspicious osseous lesion.
IMPRESSION: 1. Nonspecific prostate enlargement, with a lobulated superior
prostate margin pressing on the base of the urinary bladder.
Recommend Urology follow-up.
2. The large bowel is highly redundant. The sigmoid colon has
several redundant loops in the right upper quadrant which are
intermittently gas distended and decompressed. And the cecum is
located in the epigastrium. Still, there is no evidence of large
bowel volvulus or inflammation at this time.
3. Partially visible right hydrocele, probably postinflammatory.
4. No acute or inflammatory process identified.

## 2018-03-18 ENCOUNTER — Ambulatory Visit: Payer: Medicare HMO | Admitting: Cardiology

## 2018-03-18 ENCOUNTER — Encounter: Payer: Self-pay | Admitting: Cardiology

## 2018-03-18 VITALS — BP 132/78 | HR 62 | Ht 65.0 in | Wt 183.0 lb

## 2018-03-18 DIAGNOSIS — I10 Essential (primary) hypertension: Secondary | ICD-10-CM | POA: Diagnosis not present

## 2018-03-18 DIAGNOSIS — G459 Transient cerebral ischemic attack, unspecified: Secondary | ICD-10-CM

## 2018-03-18 NOTE — Progress Notes (Signed)
Cardiology Office Note:    Date:  03/18/2018   ID:  Walter Brown, DOB [birth year], MRN [id number]  PCP:  [name], MD  Cardiologist:  Jenean Lindau, MD   Referring MD: [name], MD    ASSESSMENT:    1. Essential hypertension   2. TIA (transient ischemic attack)    PLAN:    In order of problems listed above:  1. Primary prevention stressed with the patient.  Importance of compliance with diet and medications stressed and he vocalized understanding.  His TIA issues are handled by his primary care physician and neurology.  He has been cautioned against driving in view of the episode which is significant variant he passed out.  I secondary to that. 2. His blood pressure is stable.  He is on statin therapy for most likely plaque stabilization.  In view of his excellent effort tolerance I do not see any further need for cardiac evaluation.  He is asymptomatic.  We are awaiting event monitor records from his primary care physician. 3. Patient will be seen in follow-up appointment in 6 months or earlier if the patient has any concerns    Medication Adjustments/Labs and Tests Ordered: Current medicines are reviewed at length with the patient today.  Concerns regarding medicines are outlined above.  No orders of the defined types were placed in this encounter.  No orders of the defined types were placed in this encounter.    History of Present Illness:    Walter Brown is a 77 y.o. male who is being seen today for the evaluation of essential hypertension at the request of [name], MD.  Patient is a pleasant 77 year old male.  He is accompanied with his wife.  He mentions to me that he was admitted to the hospital with that he had a leg episode.  I reviewed records from [facility] extensively.  His carotids were unremarkable and the echocardiogram was fine without any shunting.  Subsequently has done fine he does not have any neurological deficits  is walking very regularly and is a fit gentleman overall with the good exercise plan.  No chest pain orthopnea or PND.  At the time of my evaluation, the patient is alert awake oriented and in no distress.  Past Medical History:  Diagnosis Date  . Anemia    mild  . Arthritis 04/06/2017  . Asthma    childhood  . Basal cell carcinoma    skin- on nose- basal cell (20 yrs ago) forehead 1 year ago  . BPH (benign prostatic hyperplasia)   . Cerumen impaction 08/10/2012  . Chicken pox as child  . Depression 08/01/2012  . [place] measles as a child  . Hyperlipidemia   . Hypertension   . Macular degeneration   . Medicare annual wellness visit, subsequent 11/07/2013  . Otitis externa 08/10/2012  . Preventative health care 08/01/2012  . Right shoulder pain 09/26/2014  . Sleep apnea   . Thrombocytopenia ([place])     Past Surgical History:  Procedure Laterality Date  . Anza     very young, b/l  . EXCISIONAL HEMORRHOIDECTOMY    . HYDROCELE EXCISION / REPAIR  2012   b/l  . SKIN CANCER EXCISION     nose and forehead    Current Medications: Current Meds  Medication Sig  . aspirin 81 MG tablet Take 81 mg by mouth daily.  . hydrochlorothiazide (HYDRODIURIL) 25 MG tablet TAKE 1 TABLET BY MOUTH  EVERY DAY  . Multiple Vitamins-Minerals (OCUVITE ADULT 50+ PO) Take 1 tablet by mouth daily.  . sertraline (ZOLOFT) 50 MG tablet TAKE 1 TABLET BY MOUTH EVERY DAY  . simvastatin (ZOCOR) 20 MG tablet TAKE 1 TABLET (20 MG TOTAL) BY MOUTH EVERY EVENING. (Patient taking differently: Take 10 mg by mouth every evening. )  . terazosin (HYTRIN) 5 MG capsule Take 5 mg by mouth at bedtime.      Allergies:   Azithromycin and Doxycycline hyclate   Social History   Socioeconomic History  . Marital status: Married    Spouse name: Not on file  . Number of children: 2  . Years of education: Not on file  . Highest education level: Not on file  Occupational History  . Occupation: retired  Photographer  . Financial resource strain: Not on file  . Food insecurity:    Worry: Not on file    Inability: Not on file  . Transportation needs:    Medical: Not on file    Non-medical: Not on file  Tobacco Use  . Smoking status: Former Smoker    Packs/day: 1.50    Years: 15.00    Pack years: 22.50    Start date: 10/07/1978  . Smokeless tobacco: Never Used  Substance and Sexual Activity  . Alcohol use: Yes    Comment: 2 glasses of wine daily  . Drug use: No  . 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  Lifestyle  . Physical activity:    Days per week: Not on file    Minutes per session: Not on file  . Stress: Not on file  Relationships  . Social connections:    Talks on phone: Not on file    Gets together: Not on file    Attends religious service: Not on file    Active member of club or organization: Not on file    Attends meetings of clubs or organizations: Not on file    Relationship status: Not on file  Other Topics Concern  . Not on file  Social History Narrative  . Not on file     Family History: The patient's family history includes ADD / ADHD in his son; Cancer in his paternal grandfather; Heart disease in his father and mother; Hypertension in his father and mother; Other in his mother; Other (age of onset: 40) in his son; Parkinsonism in his father; Prostate cancer in his maternal grandfather; Prostate cancer (age of onset: 69) in his father; Stroke in his maternal grandmother.  ROS:   Please see the history of present illness.    All other systems reviewed and are negative.  EKGs/Labs/Other Studies Reviewed:    The following studies were reviewed today: Hospital records were reviewed.  I am awaiting records of the event monitor.   Recent Labs: 10/14/2017: TSH 1.69 12/08/2017: BUN 16; Creatinine, Ser 1.09; Potassium 3.8; Sodium 138 12/15/2017: ALT 19 03/10/2018:  Hemoglobin 14.1; Platelets 133.0  Recent Lipid Panel    Component Value Date/Time   CHOL 134 10/14/2017 1247   TRIG 85.0 10/14/2017 1247   HDL 56.70 10/14/2017 1247   CHOLHDL 2 10/14/2017 1247   VLDL 17.0 10/14/2017 1247   LDLCALC 60 10/14/2017 1247    Physical Exam:    VS:  BP 132/78 (BP Location: Right Arm, Patient Position: Sitting, Cuff Size: Normal)   Pulse 62   Ht 5\' 5"  (  1.651 m)   Wt 183 lb (83 kg)   SpO2 98%   BMI 30.45 kg/m     Wt Readings from Last 3 Encounters:  03/18/18 183 lb (83 kg)  03/10/18 184 lb (83.5 kg)  12/08/17 179 lb 9.6 oz (81.5 kg)     GEN: Patient is in no acute distress HEENT: Normal NECK: No JVD; No carotid bruits LYMPHATICS: No lymphadenopathy CARDIAC: S1 S2 regular, 2/6 systolic murmur at the apex. RESPIRATORY:  Clear to auscultation without rales, wheezing or rhonchi  ABDOMEN: Soft, non-tender, non-distended MUSCULOSKELETAL:  No edema; No deformity  SKIN: Warm and dry NEUROLOGIC:  Alert and oriented x 3 PSYCHIATRIC:  Normal affect    Signed, Jenean Lindau, MD  03/18/2018 9:28 AM    [place]

## 2018-03-18 NOTE — Patient Instructions (Signed)
Medication Instructions:  Your physician recommends that you continue on your current medications as directed. Please refer to the Current Medication list given to you today.  Labwork: None  Testing/Procedures: None  Follow-Up: Your physician recommends that you schedule a follow-up appointment in: 6 months  Any Other Special Instructions Will Be Listed Below (If Applicable).  Will request records from Dr. Frederik Pear office today for the monitor report.   If you need a refill on your cardiac medications before your next appointment, please call your pharmacy.   [place], RN, BSN

## 2018-03-19 ENCOUNTER — Encounter: Payer: Self-pay | Admitting: Family Medicine

## 2018-03-19 ENCOUNTER — Telehealth: Payer: Self-pay

## 2018-03-19 ENCOUNTER — Encounter: Payer: Self-pay | Admitting: Cardiology

## 2018-03-19 NOTE — Telephone Encounter (Signed)
Copied from [place] [id number]. Topic: General - Other >> Mar 19, 2018 12:54 PM Marin Olp L wrote: Reason for CRM: Patient calling for results Zio heart  monitor results?

## 2018-03-20 ENCOUNTER — Encounter: Payer: Self-pay | Admitting: Family Medicine

## 2018-03-20 ENCOUNTER — Encounter: Payer: Self-pay | Admitting: Cardiology

## 2018-03-20 NOTE — Telephone Encounter (Signed)
Patient will need to get in contact with his cardiologist.  Called and left message for patient to contact his cardiologist.  I have not seen his results in Epic

## 2018-03-23 ENCOUNTER — Encounter: Payer: Self-pay | Admitting: Cardiology

## 2018-03-23 ENCOUNTER — Encounter: Payer: Self-pay | Admitting: Family Medicine

## 2018-03-23 NOTE — Telephone Encounter (Signed)
Patient came into office to have his results given to him personally.

## 2018-03-25 ENCOUNTER — Encounter: Payer: Self-pay | Admitting: Cardiology

## 2018-03-25 ENCOUNTER — Encounter (INDEPENDENT_AMBULATORY_CARE_PROVIDER_SITE_OTHER): Payer: Self-pay

## 2018-04-06 ENCOUNTER — Ambulatory Visit (INDEPENDENT_AMBULATORY_CARE_PROVIDER_SITE_OTHER): Payer: Medicare HMO | Admitting: Family Medicine

## 2018-04-06 ENCOUNTER — Encounter: Payer: Self-pay | Admitting: Family Medicine

## 2018-04-06 DIAGNOSIS — G459 Transient cerebral ischemic attack, unspecified: Secondary | ICD-10-CM | POA: Diagnosis not present

## 2018-04-06 DIAGNOSIS — I10 Essential (primary) hypertension: Secondary | ICD-10-CM | POA: Diagnosis not present

## 2018-04-06 DIAGNOSIS — I499 Cardiac arrhythmia, unspecified: Secondary | ICD-10-CM

## 2018-04-06 DIAGNOSIS — E785 Hyperlipidemia, unspecified: Secondary | ICD-10-CM | POA: Diagnosis not present

## 2018-04-06 DIAGNOSIS — I471 Supraventricular tachycardia: Secondary | ICD-10-CM

## 2018-04-06 DIAGNOSIS — Z0000 Encounter for general adult medical examination without abnormal findings: Secondary | ICD-10-CM | POA: Diagnosis not present

## 2018-04-06 NOTE — Assessment & Plan Note (Signed)
Encouraged heart healthy diet, increase exercise, avoid trans fats, consider a krill oil cap daily 

## 2018-04-06 NOTE — Assessment & Plan Note (Addendum)
Asymptomatic and following with cardiology. Had a cardiac monitoring strip run elsewhere and will try na dtrack down results again. Will proceed with cardiac stress testing ordered to further evaluate. Has had episodes of SVT and possible afib have reeqwuested records on his recent holter monitoring.

## 2018-04-06 NOTE — Assessment & Plan Note (Signed)
Well controlled, no changes to meds. Encouraged heart healthy diet such as the DASH diet and exercise as tolerated.  °

## 2018-04-06 NOTE — Progress Notes (Signed)
Subjective:  I acted as a Education administrator for Dr. Charlett Blake. [name], [place]  Patient ID: Walter Brown, male    DOB: 21-Jan-1941, 77 y.o.   MRN: [id number]  Chief Complaint  Patient presents with  . Annual Exam    HPI  Patient is in today for an annual exam and follow-up on chronic medical concerns including hyperlipidemia, hypertension and cardiac arrhythmias.  He feels well.  He is accompanied by his wife.  He denies any recent febrile illness.  Is following with cardiology and has undergone significant testing including a Holter monitor.  He has not had any recurrent episodes of chest pain.  But does note that his pulse continues to drop into the 50s and even the 40s at times.  He is asymptomatic but this concerns him.  He is tolerating exercise for the most part and walks as much as 40 minutes at a time 3-4 times a week.   is doing well with activities of daily living.  Is accompanied by his wife today.  Tries to maintain a heart healthy diet. Denies CP/palp/SOB/HA/congestion/fevers/GI or GU c/o. Taking meds as prescribed  Patient Care Team: [name], MD as PCP - General (Family Medicine) Renda Rolls, Jennefer Bravo, MD as Referring Physician (Dermatology) Luberta Mutter, MD as Consulting Physician (Ophthalmology) Ardis Hughs, MD as Consulting Physician (Urology)   Past Medical History:  Diagnosis Date  . Anemia    mild  . Arthritis 04/06/2017  . Asthma    childhood  . Basal cell carcinoma    skin- on nose- basal cell (20 yrs ago) forehead 1 year ago  . BPH (benign prostatic hyperplasia)   . Cerumen impaction 08/10/2012  . Chicken pox as child  . Depression 08/01/2012  . [place] measles as a child  . Hyperlipidemia   . Hypertension   . Macular degeneration   . Medicare annual wellness visit, subsequent 11/07/2013  . Otitis externa 08/10/2012  . Preventative health care 08/01/2012  . Right shoulder pain 09/26/2014  . Sleep apnea   . Thrombocytopenia ([place])     Past Surgical  History:  Procedure Laterality Date  . [place]     very young, b/l  . EXCISIONAL HEMORRHOIDECTOMY    . HYDROCELE EXCISION / REPAIR  2012   b/l  . SKIN CANCER EXCISION     nose and forehead    Family History  Problem Relation Age of Onset  . Hypertension Mother   . Other Mother        MRSA  . Heart disease Mother        stents  . Parkinsonism Father   . Heart disease Father   . Hypertension Father   . Prostate cancer Father 77  . Prostate cancer Maternal Grandfather   . ADD / ADHD Son        ADHD  . Other Son 24       part of 1 lung removed- due to infection  . Stroke Maternal Grandmother   . Cancer Paternal Grandfather        EYE    Social History   Socioeconomic History  . Marital status: Married    Spouse name: Not on file  . Number of children: 2  . Years of education: Not on file  . Highest education level: Not on file  Occupational History  . Occupation: retired  Scientific laboratory technician  . Financial resource strain: Not on file  . Food insecurity:    Worry: Not on file  Inability: Not on file  . Transportation needs:    Medical: Not on file    Non-medical: Not on file  Tobacco Use  . Smoking status: Former Smoker    Packs/day: 1.50    Years: 15.00    Pack years: 22.50    Start date: 10/07/1978  . Smokeless tobacco: Never Used  Substance and Sexual Activity  . Alcohol use: Yes    Comment: 2 glasses of wine daily  . Drug use: No  . 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  Lifestyle  . Physical activity:    Days per week: Not on file    Minutes per session: Not on file  . Stress: Not on file  Relationships  . Social connections:    Talks on phone: Not on file    Gets together: Not on file    Attends religious service: Not on file    Active member of club or organization: Not on file    Attends meetings of clubs or organizations: Not on file      Relationship status: Not on file  . Intimate partner violence:    Fear of current or ex partner: Not on file    Emotionally abused: Not on file    Physically abused: Not on file    Forced sexual activity: Not on file  Other Topics Concern  . Not on file  Social History Narrative  . Not on file    Outpatient Medications Prior to Visit  Medication Sig Dispense Refill  . aspirin 81 MG tablet Take 81 mg by mouth daily.    . Multiple Vitamins-Minerals (OCUVITE ADULT 50+ PO) Take 1 tablet by mouth daily.    . sertraline (ZOLOFT) 50 MG tablet TAKE 1 TABLET BY MOUTH EVERY DAY 90 tablet 0  . simvastatin (ZOCOR) 20 MG tablet TAKE 1 TABLET (20 MG TOTAL) BY MOUTH EVERY EVENING. (Patient taking differently: Take 10 mg by mouth every evening. ) 90 tablet 0  . terazosin (HYTRIN) 5 MG capsule Take 5 mg by mouth at bedtime.     . hydrochlorothiazide (HYDRODIURIL) 25 MG tablet TAKE 1 TABLET BY MOUTH EVERY DAY 90 tablet 0   No facility-administered medications prior to visit.     Allergies  Allergen Reactions  . Azithromycin Other (See Comments)    Hepatotoxicity  . Doxycycline Hyclate Other (See Comments)    Taken with Azithromycin and had Heaptotoxicity    Review of Systems  Constitutional: Negative for chills, fever and malaise/fatigue.  HENT: Negative for congestion and hearing loss.   Eyes: Negative for discharge.  Respiratory: Negative for cough, sputum production and shortness of breath.   Cardiovascular: Negative for chest pain, palpitations and leg swelling.  Gastrointestinal: Negative for abdominal pain, blood in stool, constipation, diarrhea, heartburn, nausea and vomiting.  Genitourinary: Negative for dysuria, frequency, hematuria and urgency.  Musculoskeletal: Negative for back pain, falls and myalgias.  Skin: Negative for rash.  Neurological: Negative for dizziness, sensory change, loss of consciousness, weakness and headaches.  Endo/Heme/Allergies: Negative for  environmental allergies. Does not bruise/bleed easily.  Psychiatric/Behavioral: Negative for depression and suicidal ideas. The patient is not nervous/anxious and does not have insomnia.        Objective:    Physical Exam  Constitutional: He is oriented to person, place, and time. He appears well-developed and well-nourished. No distress.  HENT:  Head: Normocephalic and atraumatic.  Eyes: Conjunctivae  are normal.  Neck: Neck supple. No thyromegaly present.  Cardiovascular: Normal rate and regular rhythm.  Pulmonary/Chest: Effort normal and breath sounds normal. No respiratory distress. He has no wheezes.  Abdominal: Soft. Bowel sounds are normal. He exhibits no mass. There is no tenderness.  Musculoskeletal: He exhibits no edema.  Lymphadenopathy:    He has no cervical adenopathy.  Neurological: He is alert and oriented to person, place, and time.  Skin: Skin is warm and dry.  Psychiatric: He has a normal mood and affect. His behavior is normal.    BP 122/78 (BP Location: Left Arm, Patient Position: Sitting, Cuff Size: Normal)   Pulse 68   Temp 97.9 F (36.6 C) (Oral)   Resp 18   Ht 5\' 6"  (1.676 m)   Wt 183 lb 12.8 oz (83.4 kg)   SpO2 98%   BMI 29.67 kg/m  Wt Readings from Last 3 Encounters:  04/06/18 183 lb 12.8 oz (83.4 kg)  03/18/18 183 lb (83 kg)  03/10/18 184 lb (83.5 kg)   BP Readings from Last 3 Encounters:  04/06/18 122/78  03/18/18 132/78  03/10/18 126/72     Immunization History  Administered Date(s) Administered  . Influenza Split 07/02/2012  . Influenza Whole 06/07/2013  . Influenza,inj,Quad PF,6+ Mos 06/21/2014  . Influenza-Unspecified 06/07/2016, 07/08/2017  . Pneumococcal Conjugate-13 11/04/2013  . Pneumococcal Polysaccharide-23 10/08/2007  . Tdap 11/04/2013  . Zoster 10/07/1998    Health Maintenance  Topic Date Due  . INFLUENZA VACCINE  05/07/2018  . TETANUS/TDAP  11/05/2023  . PNA vac Low Risk Adult  Completed    Lab Results  Component  Value Date   WBC 7.1 03/10/2018   HGB 14.1 03/10/2018   HCT 41.1 03/10/2018   PLT 133.0 (L) 03/10/2018   GLUCOSE 71 12/08/2017   CHOL 134 10/14/2017   TRIG 85.0 10/14/2017   HDL 56.70 10/14/2017   LDLCALC 60 10/14/2017   ALT 19 12/15/2017   AST 18 12/15/2017   NA 138 12/08/2017   K 3.8 12/08/2017   CL 103 12/08/2017   CREATININE 1.09 12/08/2017   BUN 16 12/08/2017   CO2 30 12/08/2017   TSH 1.69 10/14/2017   INR 1.0 12/15/2017    Lab Results  Component Value Date   TSH 1.69 10/14/2017   Lab Results  Component Value Date   WBC 7.1 03/10/2018   HGB 14.1 03/10/2018   HCT 41.1 03/10/2018   MCV 91.1 03/10/2018   PLT 133.0 (L) 03/10/2018   Lab Results  Component Value Date   NA 138 12/08/2017   K 3.8 12/08/2017   CO2 30 12/08/2017   GLUCOSE 71 12/08/2017   BUN 16 12/08/2017   CREATININE 1.09 12/08/2017   BILITOT 1.1 12/15/2017   ALKPHOS 91 12/15/2017   AST 18 12/15/2017   ALT 19 12/15/2017   PROT 6.8 12/15/2017   ALBUMIN 4.1 12/15/2017   CALCIUM 9.3 12/08/2017   ANIONGAP 11 11/19/2017   GFR 69.78 12/08/2017   Lab Results  Component Value Date   CHOL 134 10/14/2017   Lab Results  Component Value Date   HDL 56.70 10/14/2017   Lab Results  Component Value Date   LDLCALC 60 10/14/2017   Lab Results  Component Value Date   TRIG 85.0 10/14/2017   Lab Results  Component Value Date   CHOLHDL 2 10/14/2017   No results found for: HGBA1C       Assessment & Plan:   Problem List Items Addressed This Visit  Hyperlipidemia    Encouraged heart healthy diet, increase exercise, avoid trans fats, consider a krill oil cap daily      Hypertension    Well controlled, no changes to meds. Encouraged heart healthy diet such as the DASH diet and exercise as tolerated.       Preventative health care    Patient encouraged to maintain heart healthy diet, regular exercise, adequate sleep. Consider daily probiotics. Take medications as prescribed. Labs reviewed  agreed to cologuard, encouraged shingrix.      Irregular cardiac rhythm    Asymptomatic and following with cardiology. Had a cardiac monitoring strip run elsewhere and will try na dtrack down results again. Will proceed with cardiac stress testing ordered to further evaluate. Has had episodes of SVT and possible afib have reeqwuested records on his recent holter monitoring.       TIA (transient ischemic attack)    Has an appointment with Dr Leonie Man of [place] soon for further consideration       Other Visit Diagnoses    Supraventricular tachycardia ([place])          I am having Shanan O. Meir maintain his terazosin, aspirin, sertraline, simvastatin, and Multiple Vitamins-Minerals (OCUVITE ADULT 50+ PO).  No orders of the defined types were placed in this encounter.   CMA served as Education administrator during this visit. History, Physical and Plan performed by medical provider. Documentation and orders reviewed and attested to.  Penni Homans, MD

## 2018-04-06 NOTE — Patient Instructions (Signed)
Preventive Care 77 Years and Older, Male Preventive care refers to lifestyle choices and visits with your health care provider that can promote health and wellness. What does preventive care include?  A yearly physical exam. This is also called an annual well check.  Dental exams once or twice a year.  Routine eye exams. Ask your health care provider how often you should have your eyes checked.  Personal lifestyle choices, including: ? Daily care of your teeth and gums. ? Regular physical activity. ? Eating a healthy diet. ? Avoiding tobacco and drug use. ? Limiting alcohol use. ? Practicing safe sex. ? Taking low doses of aspirin every day. ? Taking vitamin and mineral supplements as recommended by your health care provider. What happens during an annual well check? The services and screenings done by your health care provider during your annual well check will depend on your age, overall health, lifestyle risk factors, and family history of disease. Counseling Your health care provider may ask you questions about your:  Alcohol use.  Tobacco use.  Drug use.  Emotional well-being.  Home and relationship well-being.  Sexual activity.  Eating habits.  History of falls.  Memory and ability to understand (cognition).  Work and work Statistician.  Screening You may have the following tests or measurements:  Height, weight, and BMI.  Blood pressure.  Lipid and cholesterol levels. These may be checked every 5 years, or more frequently if you are over 23 years old.  Skin check.  Lung cancer screening. You may have this screening every year starting at age 93 if you have a 30-pack-year history of smoking and currently smoke or have quit within the past 15 years.  Fecal occult blood test (FOBT) of the stool. You may have this test every year starting at age 35.  Flexible sigmoidoscopy or colonoscopy. You may have a sigmoidoscopy every 5 years or a colonoscopy every 10  years starting at age 15.  Prostate cancer screening. Recommendations will vary depending on your family history and other risks.  Hepatitis C blood test.  Hepatitis B blood test.  Sexually transmitted disease (STD) testing.  Diabetes screening. This is done by checking your blood sugar (glucose) after you have not eaten for a while (fasting). You may have this done every 1-3 years.  Abdominal aortic aneurysm (AAA) screening. You may need this if you are a current or former smoker.  Osteoporosis. You may be screened starting at age 45 if you are at high risk.  Talk with your health care provider about your test results, treatment options, and if necessary, the need for more tests. Vaccines Your health care provider may recommend certain vaccines, such as:  Influenza vaccine. This is recommended every year.  Tetanus, diphtheria, and acellular pertussis (Tdap, Td) vaccine. You may need a Td booster every 10 years.  Varicella vaccine. You may need this if you have not been vaccinated.  Zoster vaccine. You may need this after age 89.  Measles, mumps, and rubella (MMR) vaccine. You may need at least one dose of MMR if you were born in 1957 or later. You may also need a second dose.  Pneumococcal 13-valent conjugate (PCV13) vaccine. One dose is recommended after age 35.  Pneumococcal polysaccharide (PPSV23) vaccine. One dose is recommended after age 16.  Meningococcal vaccine. You may need this if you have certain conditions.  Hepatitis A vaccine. You may need this if you have certain conditions or if you travel or work in places where you  may be exposed to hepatitis A.  Hepatitis B vaccine. You may need this if you have certain conditions or if you travel or work in places where you may be exposed to hepatitis B.  Haemophilus influenzae type b (Hib) vaccine. You may need this if you have certain risk factors.  Talk to your health care provider about which screenings and vaccines  you need and how often you need them. This information is not intended to replace advice given to you by your health care provider. Make sure you discuss any questions you have with your health care provider. Document Released: 10/20/2015 Document Revised: 06/12/2016 Document Reviewed: 07/25/2015 Elsevier Interactive Patient Education  2018 Elsevier Inc.  

## 2018-04-08 ENCOUNTER — Ambulatory Visit: Payer: Self-pay | Admitting: Neurology

## 2018-04-08 ENCOUNTER — Other Ambulatory Visit: Payer: Self-pay | Admitting: Family Medicine

## 2018-04-12 NOTE — Assessment & Plan Note (Addendum)
Patient encouraged to maintain heart healthy diet, regular exercise, adequate sleep. Consider daily probiotics. Take medications as prescribed. Labs reviewed agreed to cologuard, encouraged shingrix.

## 2018-04-12 NOTE — Assessment & Plan Note (Signed)
Has an appointment with Dr Leonie Man of [place] soon for further consideration

## 2018-04-17 ENCOUNTER — Telehealth (HOSPITAL_COMMUNITY): Payer: Self-pay | Admitting: *Deleted

## 2018-04-17 NOTE — Telephone Encounter (Signed)
Patient given detailed instructions per Myocardial Perfusion Study Information Sheet for the test on 04/21/18. Patient notified to arrive 15 minutes early and that it is imperative to arrive on time for appointment to keep from having the test rescheduled.  If you need to cancel or reschedule your appointment, please call the office within 24 hours of your appointment. . Patient verbalized understanding. Walter Brown    

## 2018-04-21 ENCOUNTER — Ambulatory Visit: Payer: Medicare HMO | Admitting: Neurology

## 2018-04-21 ENCOUNTER — Encounter (HOSPITAL_COMMUNITY): Payer: Self-pay

## 2018-04-21 ENCOUNTER — Encounter: Payer: Self-pay | Admitting: Neurology

## 2018-04-21 VITALS — BP 115/69 | HR 64 | Ht 66.0 in | Wt 180.4 lb

## 2018-04-21 DIAGNOSIS — G459 Transient cerebral ischemic attack, unspecified: Secondary | ICD-10-CM

## 2018-04-21 DIAGNOSIS — R569 Unspecified convulsions: Secondary | ICD-10-CM

## 2018-04-21 MED ORDER — CLOPIDOGREL BISULFATE 75 MG PO TABS: 75.0000 mg | ORAL_TABLET | Freq: Every day | ORAL | 11 refills | Status: DC

## 2018-04-21 NOTE — Telephone Encounter (Signed)
Yes but the risk-benefit he is in favor of continuing Plavix. If your platelets drop below 90,000 or you start having bleeding may need to stop Plavix

## 2018-04-21 NOTE — Progress Notes (Signed)
[facility] [address] [place]. [place] [address] [phone]       OFFICE CONSULT NOTE  [name] Date of Birth:  Feb 09, 1941 Medical Record Number:  [id number]   Referring MD:  Willette Alma  Reason for Referral: TIA  HPI: Mr  Walter Brown is a pleasant 77 year old male seen today for initial consultation visit for possible TIA. History is obtained from the patient and review of electronic medical records in care everywhere. I personally reviewed imaging films. The patient was driving with his wife to [place] on 02/27/18 when he felt that the white lines dividing the [place] was swelling. He felt the line was spitting into 2. His wife asked him to pull over. The patient felt wobbly when he walked out of the car and sat down. The wife drove but noticed that her husband was sleepy and was mumbling. She drove him to urgent care in [place] very well evaluated and subsequently was transferred to [facility]. Patient states he does not remember the details very clearly. Patient states he started improving by the time he reached the hospital in 30-60 minutes later. He denied any accompanying headache slurred speech or focal extremity weakness or numbness. He did feel tired and sleepy and could not number things well. There is no tongue bite tonic-clonic activity or incontinence noted. He has no known prior history of strokes, TIAs, seizures or significant head injury with loss of consciousness. The patient had an outpatient echocardiogram done which was unremarkable. He also had MRI scan of the brain as well as MRA of the brain and neck on 02/28/18 [facility] all of which were unremarkable. He in fact also had  Zio   heart patch monitor for 2 weeks and no significant cardiac arrhythmias were found. Patient has been taking aspirin 80 mg daily as well as simvastatin was added for elevated lipids by primary physician. Patient does have family history of stroke  in his dad but there is no history of seizures. He does give history of multiple minor concussions in his youth while playing football he was never hospitalized. He feels his back to his baseline and has no complaints today.  ROS:   14 system review of systems is positive for  hearing loss, snoring, urination problems, passing out, depression, not enough sleep, snoring and all other systems negative PMH:  Past Medical History:  Diagnosis Date  . Anemia    mild  . Arthritis 04/06/2017  . Asthma    childhood  . Basal cell carcinoma    skin- on nose- basal cell (20 yrs ago) forehead 1 year ago  . BPH (benign prostatic hyperplasia)   . Cerumen impaction 08/10/2012  . Chicken pox as child  . Depression 08/01/2012  . [place] measles as a child  . Hyperlipidemia   . Hypertension   . Macular degeneration   . Medicare annual wellness visit, subsequent 11/07/2013  . Otitis externa 08/10/2012  . Preventative health care 08/01/2012  . Right shoulder pain 09/26/2014  . Sleep apnea   . Stroke ([place])   . Thrombocytopenia ([place])     Social History:  Social History   Socioeconomic History  . Marital status: Married    Spouse name: Not on file  . Number of children: 2  . Years of education: Not on file  . Highest education level: Not on file  Occupational History  . Occupation: retired  Scientific laboratory technician  . Financial resource strain: Not on file  .  Food insecurity:    Worry: Not on file    Inability: Not on file  . Transportation needs:    Medical: Not on file    Non-medical: Not on file  Tobacco Use  . Smoking status: Former Smoker    Packs/day: 1.50    Years: 15.00    Pack years: 22.50    Start date: 10/07/1978  . Smokeless tobacco: Never Used  Substance and Sexual Activity  . Alcohol use: Yes    Comment: 2 glasses of wine daily  . Drug use: No  . 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    Lifestyle  . Physical activity:    Days per week: Not on file    Minutes per session: Not on file  . Stress: Not on file  Relationships  . Social connections:    Talks on phone: Not on file    Gets together: Not on file    Attends religious service: Not on file    Active member of club or organization: Not on file    Attends meetings of clubs or organizations: Not on file    Relationship status: Not on file  . Intimate partner violence:    Fear of current or ex partner: Not on file    Emotionally abused: Not on file    Physically abused: Not on file    Forced sexual activity: Not on file  Other Topics Concern  . Not on file  Social History Narrative  . Not on file    Medications:   Current Outpatient Medications on File Prior to Visit  Medication Sig Dispense Refill  . hydrochlorothiazide (HYDRODIURIL) 25 MG tablet TAKE 1 TABLET BY MOUTH EVERY DAY 90 tablet 0  . Multiple Vitamins-Minerals (OCUVITE ADULT 50+ PO) Take 1 tablet by mouth daily.    . sertraline (ZOLOFT) 50 MG tablet TAKE 1 TABLET BY MOUTH EVERY DAY 90 tablet 0  . simvastatin (ZOCOR) 20 MG tablet TAKE 1 TABLET (20 MG TOTAL) BY MOUTH EVERY EVENING. (Patient taking differently: Take 10 mg by mouth every evening. ) 90 tablet 0  . terazosin (HYTRIN) 5 MG capsule Take 5 mg by mouth at bedtime.      No current facility-administered medications on file prior to visit.     Allergies:   Allergies  Allergen Reactions  . Azithromycin Other (See Comments)    Hepatotoxicity  . Doxycycline Hyclate Other (See Comments)    Taken with Azithromycin and had Heaptotoxicity    Physical Exam General: well developed, well nourished elderly Caucasian male, seated, in no evident distress Head: head normocephalic and atraumatic.   Neck: supple with no carotid or supraclavicular bruits Cardiovascular: regular rate and rhythm, no murmurs Musculoskeletal: no deformity Skin:  no rash/petichiae Vascular:  Normal pulses all  extremities  Neurologic Exam Mental Status: Awake and fully alert. Oriented to place and time. Recent and remote memory intact. Attention span, concentration and fund of knowledge appropriate. Mood and affect appropriate.  Cranial Nerves: Fundoscopic exam reveals sharp disc margins. Pupils equal, briskly reactive to light. Extraocular movements full without nystagmus. Visual fields full to confrontation. Hearing slightly decreased bilaterally despite hearing aids.. Facial sensation intact. Face, tongue, palate moves normally and symmetrically.  Motor: Normal bulk and tone. Normal strength in all tested extremity muscles. Sensory.: intact to touch , pinprick , position and vibratory sensation.  Coordination: Rapid alternating movements normal in all extremities.  Finger-to-nose and heel-to-shin performed accurately bilaterally. Gait and Station: Arises from chair without difficulty. Stance is normal. Gait demonstrates normal stride length and balance . Able to heel, toe and tandem walk without difficulty.  Reflexes: 1+ and symmetric. Toes downgoing.   NIHSS  0 Modified Rankin 0   ASSESSMENT: 77 year old Caucasian male with transient episode of vision disturbance and altered consciousness possibly posterior circulation TIA versus complex partial seizure.    PLAN: I had a long d/w patient and his wife about his recent episode of vision difficulties with brief altered consciousness possible posterior circulation TIA versus complex partial seizure, discussed risk for recurrent stroke/TIAs, personally independently reviewed imaging studies and stroke evaluation results and answered questions . I recommend he discontinue aspirin and change to Plavix 75 mg daily  for secondary stroke prevention and maintain strict control of hypertension with blood pressure goal below 130/90, diabetes with hemoglobin A1c goal below 6.5% and lipids with LDL cholesterol goal below 70 mg/dL. I also advised the patient to  eat a healthy diet with plenty of whole grains, cereals, fruits and vegetables, exercise regularly and maintain ideal body weight. Check EEG for seizure activity. I advised him to keep himself well hydrated Followup in the future with me in 2 months or call earlier if necessary. Greater than 50% time during this 45 minute consultation visit was spent on counseling and coordination of care about TIA and complex versus seizure and answering questions Antony Contras, MD  [facility] [address] [place], [place] [address]  Phone [phone] Fax [phone] Note: This document was prepared with digital dictation and possible smart phrase technology. Any transcriptional errors that result from this process are unintentional.

## 2018-04-21 NOTE — Patient Instructions (Signed)
I had a long d/w patient and his wife about his recent episode of vision difficulties with brief altered consciousness possible posterior circulation TIA versus complex partial seizure, discussed risk for recurrent stroke/TIAs, personally independently reviewed imaging studies and stroke evaluation results and answered questions . I recommend he discontinue aspirin and change to Plavix 75 mg daily  for secondary stroke prevention and maintain strict control of hypertension with blood pressure goal below 130/90, diabetes with hemoglobin A1c goal below 6.5% and lipids with LDL cholesterol goal below 70 mg/dL. I also advised the patient to eat a healthy diet with plenty of whole grains, cereals, fruits and vegetables, exercise regularly and maintain ideal body weight. Check EEG for seizure activity. I advised him to keep himself well hydrated Followup in the future with me in 2 months or call earlier if necessary.  Stroke Prevention Some medical conditions and behaviors are associated with a higher chance of having a stroke. You can help prevent a stroke by making nutrition, lifestyle, and other changes, including managing any medical conditions you may have. What nutrition changes can be made?  Eat healthy foods. You can do this by: ? Choosing foods high in fiber, such as fresh fruits and vegetables and whole grains. ? Eating at least 5 or more servings of fruits and vegetables a day. Try to fill half of your plate at each meal with fruits and vegetables. ? Choosing lean protein foods, such as lean cuts of meat, poultry without skin, fish, tofu, beans, and nuts. ? Eating low-fat dairy products. ? Avoiding foods that are high in salt (sodium). This can help lower blood pressure. ? Avoiding foods that have saturated fat, trans fat, and cholesterol. This can help prevent high cholesterol. ? Avoiding processed and premade foods.  Follow your health care provider's specific guidelines for losing weight,  controlling high blood pressure (hypertension), lowering high cholesterol, and managing diabetes. These may include: ? Reducing your daily calorie intake. ? Limiting your daily sodium intake to 1,500 milligrams (mg). ? Using only healthy fats for cooking, such as olive oil, canola oil, or sunflower oil. ? Counting your daily carbohydrate intake. What lifestyle changes can be made?  Maintain a healthy weight. Talk to your health care provider about your ideal weight.  Get at least 30 minutes of moderate physical activity at least 5 days a week. Moderate activity includes brisk walking, biking, and swimming.  Do not use any products that contain nicotine or tobacco, such as cigarettes and e-cigarettes. If you need help quitting, ask your health care provider. It may also be helpful to avoid exposure to secondhand smoke.  Limit alcohol intake to no more than 1 drink a day for nonpregnant women and 2 drinks a day for men. One drink equals 12 oz of beer, 5 oz of wine, or 1 oz of hard liquor.  Stop any illegal drug use.  Avoid taking birth control pills. Talk to your health care provider about the risks of taking birth control pills if: ? You are over 77 years old. ? You smoke. ? You get migraines. ? You have ever had a blood clot. What other changes can be made?  Manage your cholesterol levels. ? Eating a healthy diet is important for preventing high cholesterol. If cholesterol cannot be managed through diet alone, you may also need to take medicines. ? Take any prescribed medicines to control your cholesterol as told by your health care provider.  Manage your diabetes. ? Eating a healthy diet and  exercising regularly are important parts of managing your blood sugar. If your blood sugar cannot be managed through diet and exercise, you may need to take medicines. ? Take any prescribed medicines to control your diabetes as told by your health care provider.  Control your hypertension. ? To  reduce your risk of stroke, try to keep your blood pressure below 130/80. ? Eating a healthy diet and exercising regularly are an important part of controlling your blood pressure. If your blood pressure cannot be managed through diet and exercise, you may need to take medicines. ? Take any prescribed medicines to control hypertension as told by your health care provider. ? Ask your health care provider if you should monitor your blood pressure at home. ? Have your blood pressure checked every year, even if your blood pressure is normal. Blood pressure increases with age and some medical conditions.  Get evaluated for sleep disorders (sleep apnea). Talk to your health care provider about getting a sleep evaluation if you snore a lot or have excessive sleepiness.  Take over-the-counter and prescription medicines only as told by your health care provider. Aspirin or blood thinners (antiplatelets or anticoagulants) may be recommended to reduce your risk of forming blood clots that can lead to stroke.  Make sure that any other medical conditions you have, such as atrial fibrillation or atherosclerosis, are managed. What are the warning signs of a stroke? The warning signs of a stroke can be easily remembered as BEFAST.  B is for balance. Signs include: ? Dizziness. ? Loss of balance or coordination. ? Sudden trouble walking.  E is for eyes. Signs include: ? A sudden change in vision. ? Trouble seeing.  F is for face. Signs include: ? Sudden weakness or numbness of the face. ? The face or eyelid drooping to one side.  A is for arms. Signs include: ? Sudden weakness or numbness of the arm, usually on one side of the body.  S is for speech. Signs include: ? Trouble speaking (aphasia). ? Trouble understanding.  T is for time. ? These symptoms may represent a serious problem that is an emergency. Do not wait to see if the symptoms will go away. Get medical help right away. Call your local  emergency services (911 in the [place] Do not drive yourself to the hospital.  Other signs of stroke may include: ? A sudden, severe headache with no known cause. ? Nausea or vomiting. ? Seizure.  Where to find more information: For more information, visit:  American Stroke Association: www.strokeassociation.org  National Stroke Association: www.stroke.org  Summary  You can prevent a stroke by eating healthy, exercising, not smoking, limiting alcohol intake, and managing any medical conditions you may have.  Do not use any products that contain nicotine or tobacco, such as cigarettes and e-cigarettes. If you need help quitting, ask your health care provider. It may also be helpful to avoid exposure to secondhand smoke.  Remember BEFAST for warning signs of stroke. Get help right away if you or a loved one has any of these signs. This information is not intended to replace advice given to you by your health care provider. Make sure you discuss any questions you have with your health care provider. Document Released: 10/31/2004 Document Revised: 10/29/2016 Document Reviewed: 10/29/2016 Elsevier Interactive Patient Education  [employer].

## 2018-04-22 ENCOUNTER — Ambulatory Visit: Payer: Medicare HMO | Admitting: Neurology

## 2018-04-22 DIAGNOSIS — R569 Unspecified convulsions: Secondary | ICD-10-CM

## 2018-04-22 DIAGNOSIS — G459 Transient cerebral ischemic attack, unspecified: Secondary | ICD-10-CM

## 2018-04-23 ENCOUNTER — Encounter: Payer: Self-pay | Admitting: Family Medicine

## 2018-04-23 ENCOUNTER — Ambulatory Visit (HOSPITAL_COMMUNITY): Payer: Medicare HMO | Attending: Cardiology

## 2018-04-23 DIAGNOSIS — R55 Syncope and collapse: Secondary | ICD-10-CM | POA: Diagnosis not present

## 2018-04-23 DIAGNOSIS — I471 Supraventricular tachycardia: Secondary | ICD-10-CM | POA: Diagnosis not present

## 2018-04-23 DIAGNOSIS — R002 Palpitations: Secondary | ICD-10-CM | POA: Diagnosis not present

## 2018-04-23 DIAGNOSIS — Z8249 Family history of ischemic heart disease and other diseases of the circulatory system: Secondary | ICD-10-CM | POA: Insufficient documentation

## 2018-04-23 LAB — MYOCARDIAL PERFUSION IMAGING
CHL CUP NUCLEAR SDS: 3
CHL CUP NUCLEAR SRS: 8
CSEPEDS: 0 s
CSEPHR: 88 %
CSEPPHR: 127 {beats}/min
Estimated workload: 7.4 METS
Exercise duration (min): 6 min
LV dias vol: 100 mL (ref 62–150)
LVSYSVOL: 44 mL
MPHR: 143 {beats}/min
NUC STRESS TID: 1
RATE: 0.34
Rest HR: 73 {beats}/min
SSS: 11

## 2018-04-23 MED ORDER — TECHNETIUM TC 99M TETROFOSMIN IV KIT
10.2000 | PACK | Freq: Once | INTRAVENOUS | Status: AC | PRN
  Administered 2018-04-23: 10.2 via INTRAVENOUS
  Filled 2018-04-23: qty 11

## 2018-04-23 MED ORDER — TECHNETIUM TC 99M TETROFOSMIN IV KIT
30.8000 | PACK | Freq: Once | INTRAVENOUS | Status: AC | PRN
  Administered 2018-04-23: 30.8 via INTRAVENOUS
  Filled 2018-04-23: qty 31

## 2018-04-24 ENCOUNTER — Encounter: Payer: Self-pay | Admitting: Neurology

## 2018-05-01 ENCOUNTER — Other Ambulatory Visit: Payer: Self-pay | Admitting: Family Medicine

## 2018-05-05 DIAGNOSIS — R69 Illness, unspecified: Secondary | ICD-10-CM | POA: Diagnosis not present

## 2018-05-12 DIAGNOSIS — R69 Illness, unspecified: Secondary | ICD-10-CM | POA: Diagnosis not present

## 2018-05-12 DIAGNOSIS — Z809 Family history of malignant neoplasm, unspecified: Secondary | ICD-10-CM | POA: Diagnosis not present

## 2018-05-12 DIAGNOSIS — Z6830 Body mass index (BMI) 30.0-30.9, adult: Secondary | ICD-10-CM | POA: Diagnosis not present

## 2018-05-12 DIAGNOSIS — E785 Hyperlipidemia, unspecified: Secondary | ICD-10-CM | POA: Diagnosis not present

## 2018-05-12 DIAGNOSIS — N529 Male erectile dysfunction, unspecified: Secondary | ICD-10-CM | POA: Diagnosis not present

## 2018-05-12 DIAGNOSIS — Z8249 Family history of ischemic heart disease and other diseases of the circulatory system: Secondary | ICD-10-CM | POA: Diagnosis not present

## 2018-05-12 DIAGNOSIS — I10 Essential (primary) hypertension: Secondary | ICD-10-CM | POA: Diagnosis not present

## 2018-05-12 DIAGNOSIS — Z823 Family history of stroke: Secondary | ICD-10-CM | POA: Diagnosis not present

## 2018-05-12 DIAGNOSIS — Z7902 Long term (current) use of antithrombotics/antiplatelets: Secondary | ICD-10-CM | POA: Diagnosis not present

## 2018-05-12 DIAGNOSIS — E669 Obesity, unspecified: Secondary | ICD-10-CM | POA: Diagnosis not present

## 2018-05-13 ENCOUNTER — Ambulatory Visit: Payer: Self-pay | Admitting: Neurology

## 2018-05-21 DIAGNOSIS — H35372 Puckering of macula, left eye: Secondary | ICD-10-CM | POA: Diagnosis not present

## 2018-05-21 DIAGNOSIS — H43812 Vitreous degeneration, left eye: Secondary | ICD-10-CM | POA: Diagnosis not present

## 2018-05-21 DIAGNOSIS — H353131 Nonexudative age-related macular degeneration, bilateral, early dry stage: Secondary | ICD-10-CM | POA: Diagnosis not present

## 2018-05-26 DIAGNOSIS — R69 Illness, unspecified: Secondary | ICD-10-CM | POA: Diagnosis not present

## 2018-05-28 DIAGNOSIS — N529 Male erectile dysfunction, unspecified: Secondary | ICD-10-CM | POA: Diagnosis not present

## 2018-05-28 DIAGNOSIS — R3912 Poor urinary stream: Secondary | ICD-10-CM | POA: Diagnosis not present

## 2018-05-28 DIAGNOSIS — N401 Enlarged prostate with lower urinary tract symptoms: Secondary | ICD-10-CM | POA: Diagnosis not present

## 2018-05-31 ENCOUNTER — Other Ambulatory Visit: Payer: Self-pay | Admitting: Family Medicine

## 2018-06-16 ENCOUNTER — Encounter: Payer: Self-pay | Admitting: Family Medicine

## 2018-06-16 NOTE — Telephone Encounter (Signed)
Per routing comment from PCP, pt needs OV for further evaluation. No availability with PCP. Scheduled appt with Dr Nani Ravens for tomorrow at 2:15pm.

## 2018-06-16 NOTE — Telephone Encounter (Signed)
Patient contacted his cardiologist

## 2018-06-16 NOTE — Telephone Encounter (Signed)
See PCP response in previous email from pt on 06/16/18.

## 2018-06-17 ENCOUNTER — Ambulatory Visit (INDEPENDENT_AMBULATORY_CARE_PROVIDER_SITE_OTHER): Payer: Medicare HMO | Admitting: Family Medicine

## 2018-06-17 ENCOUNTER — Other Ambulatory Visit: Payer: Self-pay

## 2018-06-17 ENCOUNTER — Ambulatory Visit (HOSPITAL_BASED_OUTPATIENT_CLINIC_OR_DEPARTMENT_OTHER)
Admission: RE | Admit: 2018-06-17 | Discharge: 2018-06-17 | Disposition: A | Discharge status: Home / Self Care | Payer: Medicare HMO | Source: Ambulatory Visit | Attending: Family Medicine | Admitting: Family Medicine

## 2018-06-17 ENCOUNTER — Encounter: Payer: Self-pay | Admitting: Family Medicine

## 2018-06-17 VITALS — BP 122/70 | HR 80 | Temp 98.0°F | Resp 22 | Ht 66.0 in | Wt 179.8 lb

## 2018-06-17 DIAGNOSIS — R0602 Shortness of breath: Secondary | ICD-10-CM | POA: Diagnosis not present

## 2018-06-17 DIAGNOSIS — R0600 Dyspnea, unspecified: Secondary | ICD-10-CM | POA: Insufficient documentation

## 2018-06-17 DIAGNOSIS — R05 Cough: Secondary | ICD-10-CM | POA: Diagnosis not present

## 2018-06-17 MED ORDER — ALBUTEROL SULFATE 108 (90 BASE) MCG/ACT IN AEPB: 1.0000 | INHALATION_SPRAY | Freq: Four times a day (QID) | RESPIRATORY_TRACT | 1 refills | Status: DC | PRN

## 2018-06-17 NOTE — Patient Instructions (Signed)
We will be in touch regarding your chest X-ray results.  Use the inhaler if you feel like you are coughing or short of breath.  Let us know if you need anything.

## 2018-06-17 NOTE — Progress Notes (Signed)
Pt. Stated chest congestion started shortly after returning from [place] 08/2017. Pt. has had poor activity tolerance since. Pt. Said he had TIA 5/31, and thorough work-up was performed by [facility].

## 2018-06-17 NOTE — Progress Notes (Signed)
Chief Complaint  Patient presents with  . Cough    SOB X 6-8 month    Subjective: Patient is a 77 y.o. male here for sob.  Pt with hx of childhood asthma, former smoker, CAD here for SOB. This has been goin gon for 6-8 mo. Had been tx'd for PNA in past.  The patient denies any history of COPD.  He has a 22.5-pack-year history and has not smoked in nearly 40 years.  He has never had pulmonary function testing.  No recent fevers, chest pain, palpitations, unintentional weight loss, recent travel, or medication changes.  ROS: Heart: Denies chest pain  Lungs: + SOB   Past Medical History:  Diagnosis Date  . Anemia    mild  . Arthritis 04/06/2017  . Asthma    childhood  . Basal cell carcinoma    skin- on nose- basal cell (20 yrs ago) forehead 1 year ago  . BPH (benign prostatic hyperplasia)   . Cerumen impaction 08/10/2012  . Chicken pox as child  . Depression 08/01/2012  . [place] measles as a child  . Hyperlipidemia   . Hypertension   . Macular degeneration   . Medicare annual wellness visit, subsequent 11/07/2013  . Otitis externa 08/10/2012  . Preventative health care 08/01/2012  . Sleep apnea   . Stroke ([place])   . Thrombocytopenia ([facility])     Objective: BP 122/70 (BP Location: Left Arm, Patient Position: Sitting)   Pulse 80   Temp 98 F (36.7 C)   Resp (!) 22   Ht 5\' 6"  (1.676 m)   Wt 179 lb 12.8 oz (81.6 kg)   SpO2 97%   BMI 29.02 kg/m  General: Awake, appears stated age HEENT: MMM, EOMi, nares neg Heart: RRR, no bruits, no LE edema Lungs: CTAB, no rales, wheezes or rhonchi. No accessory muscle use Psych: Age appropriate judgment and insight, normal affect and mood  Assessment and Plan: Dyspnea, unspecified type - Plan: Albuterol Sulfate 108 (90 Base) MCG/ACT AEPB, DG Chest 2 View  Trial SABA and ck XR. Neg stress test less than 2 mo ago.  Would consider pulmonary function testing if no improvement versus referral to pulmonology. F/u in 1 week as originally  scheduled with Dr. Charlett Blake.  The patient voiced understanding and agreement to the plan.  [place], DO 06/17/18  5:06 PM

## 2018-06-23 ENCOUNTER — Encounter: Payer: Self-pay | Admitting: Family Medicine

## 2018-06-23 ENCOUNTER — Ambulatory Visit (INDEPENDENT_AMBULATORY_CARE_PROVIDER_SITE_OTHER): Payer: Medicare HMO | Admitting: Family Medicine

## 2018-06-23 VITALS — BP 124/80 | HR 58 | Temp 97.6°F | Resp 16 | Ht 66.0 in | Wt 179.0 lb

## 2018-06-23 DIAGNOSIS — R05 Cough: Secondary | ICD-10-CM | POA: Diagnosis not present

## 2018-06-23 DIAGNOSIS — I10 Essential (primary) hypertension: Secondary | ICD-10-CM

## 2018-06-23 DIAGNOSIS — R000 Tachycardia, unspecified: Secondary | ICD-10-CM | POA: Diagnosis not present

## 2018-06-23 DIAGNOSIS — R001 Bradycardia, unspecified: Secondary | ICD-10-CM

## 2018-06-23 DIAGNOSIS — E785 Hyperlipidemia, unspecified: Secondary | ICD-10-CM | POA: Diagnosis not present

## 2018-06-23 DIAGNOSIS — R059 Cough, unspecified: Secondary | ICD-10-CM | POA: Insufficient documentation

## 2018-06-23 LAB — COMPREHENSIVE METABOLIC PANEL
ALBUMIN: 4.3 g/dL (ref 3.5–5.2)
ALK PHOS: 58 U/L (ref 39–117)
ALT: 13 U/L (ref 0–53)
AST: 19 U/L (ref 0–37)
BILIRUBIN TOTAL: 0.8 mg/dL (ref 0.2–1.2)
BUN: 15 mg/dL (ref 6–23)
CO2: 27 mEq/L (ref 19–32)
CREATININE: 1.06 mg/dL (ref 0.40–1.50)
Calcium: 9.1 mg/dL (ref 8.4–10.5)
Chloride: 102 mEq/L (ref 96–112)
GFR: 71.96 mL/min (ref >60.00)
Glucose, Bld: 78 mg/dL (ref 70–99)
Potassium: 3.9 mEq/L (ref 3.5–5.1)
Sodium: 139 mEq/L (ref 135–145)
TOTAL PROTEIN: 6.7 g/dL (ref 6.0–8.3)

## 2018-06-23 LAB — CBC WITH DIFFERENTIAL/PLATELET
Basophils Absolute: 0 10*3/uL (ref 0.0–0.1)
Basophils Relative: 0.8 % (ref 0.0–3.0)
EOS ABS: 0.1 10*3/uL (ref 0.0–0.7)
EOS PCT: 1.2 % (ref 0.0–5.0)
HEMATOCRIT: 42 % (ref 39.0–52.0)
HEMOGLOBIN: 14.6 g/dL (ref 13.0–17.0)
LYMPHS PCT: 26.6 % (ref 12.0–46.0)
Lymphs Abs: 1.7 10*3/uL (ref 0.7–4.0)
MCHC: 34.8 g/dL (ref 30.0–36.0)
MCV: 91.6 fl (ref 78.0–100.0)
MONO ABS: 0.6 10*3/uL (ref 0.1–1.0)
Monocytes Relative: 9.3 % (ref 3.0–12.0)
Neutro Abs: 4.1 10*3/uL (ref 1.4–7.7)
Neutrophils Relative %: 62.1 % (ref 43.0–77.0)
Platelets: 128 10*3/uL — ABNORMAL LOW (ref 150.0–400.0)
RBC: 4.59 Mil/uL (ref 4.22–5.81)
RDW: 13.9 % (ref 11.5–15.5)
WBC: 6.5 10*3/uL (ref 4.0–10.5)

## 2018-06-23 LAB — LIPID PANEL
CHOLESTEROL: 133 mg/dL (ref 0–200)
HDL: 58.6 mg/dL (ref >39.00)
LDL Cholesterol: 58 mg/dL (ref 0–99)
NonHDL: 74.63
Total CHOL/HDL Ratio: 2
Triglycerides: 82 mg/dL (ref 0.0–149.0)
VLDL: 16.4 mg/dL (ref 0.0–40.0)

## 2018-06-23 LAB — TSH: TSH: 1.71 u[IU]/mL (ref 0.35–4.50)

## 2018-06-23 MED ORDER — HYDROCHLOROTHIAZIDE 25 MG PO TABS: 25.0000 mg | ORAL_TABLET | Freq: Every day | ORAL | 1 refills | Status: DC

## 2018-06-23 MED ORDER — SERTRALINE HCL 25 MG PO TABS: 25.0000 mg | ORAL_TABLET | Freq: Every day | ORAL | 1 refills | Status: DC

## 2018-06-23 MED ORDER — SIMVASTATIN 20 MG PO TABS: 10.0000 mg | ORAL_TABLET | Freq: Every evening | ORAL | 1 refills | Status: DC

## 2018-06-23 NOTE — Assessment & Plan Note (Addendum)
Cough non productive for several months, sob, likely multifactorial. Encouraged antihistamines and consider H2 blockers. Referred to cardiology forconsideration. CXR unremarkable will proceed with CT scan

## 2018-06-23 NOTE — Assessment & Plan Note (Signed)
Well controlled, no changes to meds. Encouraged heart healthy diet such as the DASH diet and exercise as tolerated.  °

## 2018-06-23 NOTE — Patient Instructions (Signed)
Cough, Adult  Coughing is a reflex that clears your throat and your airways. Coughing helps to heal and protect your lungs. It is normal to cough occasionally, but a cough that happens with other symptoms or lasts a long time may be a sign of a condition that needs treatment. A cough may last only 2-3 weeks (acute), or it may last longer than 8 weeks (chronic).  What are the causes?  Coughing is commonly caused by:   Breathing in substances that irritate your lungs.   A viral or bacterial respiratory infection.   Allergies.   Asthma.   Postnasal drip.   Smoking.   Acid backing up from the stomach into the esophagus (gastroesophageal reflux).   Certain medicines.   Chronic lung problems, including COPD (or rarely, lung cancer).   Other medical conditions such as heart failure.    Follow these instructions at home:  Pay attention to any changes in your symptoms. Take these actions to help with your discomfort:   Take medicines only as told by your health care provider.  ? If you were prescribed an antibiotic medicine, take it as told by your health care provider. Do not stop taking the antibiotic even if you start to feel better.  ? Talk with your health care provider before you take a cough suppressant medicine.   Drink enough fluid to keep your urine clear or pale yellow.   If the air is dry, use a cold steam vaporizer or humidifier in your bedroom or your home to help loosen secretions.   Avoid anything that causes you to cough at work or at home.   If your cough is worse at night, try sleeping in a semi-upright position.   Avoid cigarette smoke. If you smoke, quit smoking. If you need help quitting, ask your health care provider.   Avoid caffeine.   Avoid alcohol.   Rest as needed.    Contact a health care provider if:   You have new symptoms.   You cough up pus.   Your cough does not get better after 2-3 weeks, or your cough gets worse.   You cannot control your cough with suppressant  medicines and you are losing sleep.   You develop pain that is getting worse or pain that is not controlled with pain medicines.   You have a fever.   You have unexplained weight loss.   You have night sweats.  Get help right away if:   You cough up blood.   You have difficulty breathing.   Your heartbeat is very fast.  This information is not intended to replace advice given to you by your health care provider. Make sure you discuss any questions you have with your health care provider.  Document Released: 03/22/2011 Document Revised: 02/29/2016 Document Reviewed: 11/30/2014  Elsevier Interactive Patient Education  2018 Elsevier Inc.

## 2018-06-23 NOTE — Assessment & Plan Note (Addendum)
Encouraged heart healthy diet, increase exercise, avoid trans fats, consider a krill oil cap daily. Tolerating simvastatin 10 mg

## 2018-06-24 ENCOUNTER — Telehealth: Payer: Self-pay | Admitting: *Deleted

## 2018-06-24 NOTE — Telephone Encounter (Signed)
Received comprehensive chart notes from [employer]; forwarded to provider/SLS 09/18

## 2018-06-26 NOTE — Telephone Encounter (Signed)
Pt seen on 06/23/2018.

## 2018-06-28 DIAGNOSIS — R001 Bradycardia, unspecified: Secondary | ICD-10-CM | POA: Insufficient documentation

## 2018-06-28 HISTORY — DX: Bradycardia, unspecified: R00.1

## 2018-06-28 NOTE — Progress Notes (Signed)
Subjective:    Patient ID: Walter Brown, male    DOB: [birth year], 77 y.o.   MRN: [id number]  Chief Complaint  Patient presents with  . Tachycardia    rapid heart beat, 10 days, unable to exercise, shortness of breath, saw dr. Nani Ravens given an inhaler    HPI Patient is in today for follow up. He has been struggling with a  Now non productive cough that has not fully resolved despite treatment. No fevers or chills. Cough has been intermittent [place]. Endorses some fatigue as well. Endorses some Dyspnea on Exertion presently. Denies CP/palp/HA/fevers/GI or GU c/o. Taking meds as prescribed  Past Medical History:  Diagnosis Date  . Anemia    mild  . Arthritis 04/06/2017  . Asthma    childhood  . Basal cell carcinoma    skin- on nose- basal cell (20 yrs ago) forehead 1 year ago  . BPH (benign prostatic hyperplasia)   . Cerumen impaction 08/10/2012  . Chicken pox as child  . Depression 08/01/2012  . [place] measles as a child  . Hyperlipidemia   . Hypertension   . Macular degeneration   . Medicare annual wellness visit, subsequent 11/07/2013  . Otitis externa 08/10/2012  . Preventative health care 08/01/2012  . Sleep apnea   . Stroke ([place])   . Thrombocytopenia ([place])     Past Surgical History:  Procedure Laterality Date  . Wink     very young, b/l  . EXCISIONAL HEMORRHOIDECTOMY    . HYDROCELE EXCISION / REPAIR  2012   b/l  . SKIN CANCER EXCISION     nose and forehead    Family History  Problem Relation Age of Onset  . Hypertension Mother   . Other Mother        MRSA  . Heart disease Mother        stents  . Parkinsonism Father   . Heart disease Father   . Hypertension Father   . Prostate cancer Father 43  . Prostate cancer Maternal Grandfather   . ADD / ADHD Son        ADHD  . Other Son 24       part of 1 lung removed- due to infection  . Stroke Maternal Grandmother   . Cancer Paternal Grandfather        EYE     Social History   Socioeconomic History  . Marital status: Married    Spouse name: Not on file  . Number of children: 2  . Years of education: Not on file  . Highest education level: Not on file  Occupational History  . Occupation: retired  Scientific laboratory technician  . Financial resource strain: Not on file  . Food insecurity:    Worry: Not on file    Inability: Not on file  . Transportation needs:    Medical: Not on file    Non-medical: Not on file  Tobacco Use  . Smoking status: Former Smoker    Packs/day: 1.50    Years: 15.00    Pack years: 22.50    Start date: 10/07/1978  . Smokeless tobacco: Never Used  Substance and Sexual Activity  . Alcohol use: Yes    Comment: 2 glasses of wine daily  . Drug use: No  . 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  Lifestyle  . Physical activity:  Days per week: Not on file    Minutes per session: Not on file  . Stress: Not on file  Relationships  . Social connections:    Talks on phone: Not on file    Gets together: Not on file    Attends religious service: Not on file    Active member of club or organization: Not on file    Attends meetings of clubs or organizations: Not on file    Relationship status: Not on file  . Intimate partner violence:    Fear of current or ex partner: Not on file    Emotionally abused: Not on file    Physically abused: Not on file    Forced sexual activity: Not on file  Other Topics Concern  . Not on file  Social History Narrative  . Not on file    Outpatient Medications Prior to Visit  Medication Sig Dispense Refill  . Albuterol Sulfate 108 (90 Base) MCG/ACT AEPB Inhale 1-2 puffs into the lungs every 6 (six) hours as needed (Shortness of breath or cough). 1 each 1  . clopidogrel (PLAVIX) 75 MG tablet Take 1 tablet (75 mg total) by mouth daily. 30 tablet 11  . Multiple Vitamins-Minerals (OCUVITE ADULT 50+  PO) Take 1 tablet by mouth daily.    Marland Kitchen terazosin (HYTRIN) 10 MG capsule Take 10 mg by mouth at bedtime.    . hydrochlorothiazide (HYDRODIURIL) 25 MG tablet TAKE 1 TABLET BY MOUTH EVERY DAY 90 tablet 0  . sertraline (ZOLOFT) 50 MG tablet TAKE 1 TABLET (50 MG TOTAL) BY MOUTH DAILY. 90 tablet 0  . simvastatin (ZOCOR) 20 MG tablet TAKE 1 TABLET (20 MG TOTAL) BY MOUTH EVERY EVENING. 90 tablet 0   No facility-administered medications prior to visit.     Allergies  Allergen Reactions  . Azithromycin Other (See Comments)    Hepatotoxicity  . Doxycycline Hyclate Other (See Comments)    Taken with Azithromycin and had Heaptotoxicity    Review of Systems  Constitutional: Positive for malaise/fatigue. Negative for fever.  HENT: Negative for congestion.   Eyes: Negative for blurred vision.  Respiratory: Positive for cough. Negative for shortness of breath.   Cardiovascular: Negative for chest pain, palpitations and leg swelling.  Gastrointestinal: Negative for abdominal pain, blood in stool and nausea.  Genitourinary: Negative for dysuria and frequency.  Musculoskeletal: Negative for falls.  Skin: Negative for rash.  Neurological: Negative for dizziness, loss of consciousness and headaches.  Endo/Heme/Allergies: Negative for environmental allergies.  Psychiatric/Behavioral: Negative for depression. The patient is not nervous/anxious.        Objective:    Physical Exam  Constitutional: He is oriented to person, place, and time. He appears well-developed and well-nourished. No distress.  HENT:  Head: Normocephalic and atraumatic.  Nose: Nose normal.  Eyes: Right eye exhibits no discharge. Left eye exhibits no discharge.  Neck: Normal range of motion. Neck supple.  Cardiovascular: Regular rhythm.  No murmur heard. bradycardia  Pulmonary/Chest: Effort normal and breath sounds normal.  Abdominal: Soft. Bowel sounds are normal. There is no tenderness.  Musculoskeletal: He exhibits no  edema.  Neurological: He is alert and oriented to person, place, and time.  Skin: Skin is warm and dry.  Psychiatric: He has a normal mood and affect.  Nursing note and vitals reviewed.   BP 124/80 (BP Location: Right Arm, Patient Position: Sitting, Cuff Size: Normal)   Pulse (!) 58   Temp 97.6 F (36.4 C) (Oral)   Resp 16  Ht 5\' 6"  (1.676 m)   Wt 179 lb (81.2 kg)   SpO2 98%   BMI 28.89 kg/m  Wt Readings from Last 3 Encounters:  06/23/18 179 lb (81.2 kg)  06/17/18 179 lb 12.8 oz (81.6 kg)  04/23/18 183 lb (83 kg)     Lab Results  Component Value Date   WBC 6.5 06/23/2018   HGB 14.6 06/23/2018   HCT 42.0 06/23/2018   PLT 128.0 (L) 06/23/2018   GLUCOSE 78 06/23/2018   CHOL 133 06/23/2018   TRIG 82.0 06/23/2018   HDL 58.60 06/23/2018   LDLCALC 58 06/23/2018   ALT 13 06/23/2018   AST 19 06/23/2018   NA 139 06/23/2018   K 3.9 06/23/2018   CL 102 06/23/2018   CREATININE 1.06 06/23/2018   BUN 15 06/23/2018   CO2 27 06/23/2018   TSH 1.71 06/23/2018   INR 1.0 12/15/2017    Lab Results  Component Value Date   TSH 1.71 06/23/2018   Lab Results  Component Value Date   WBC 6.5 06/23/2018   HGB 14.6 06/23/2018   HCT 42.0 06/23/2018   MCV 91.6 06/23/2018   PLT 128.0 (L) 06/23/2018   Lab Results  Component Value Date   NA 139 06/23/2018   K 3.9 06/23/2018   CO2 27 06/23/2018   GLUCOSE 78 06/23/2018   BUN 15 06/23/2018   CREATININE 1.06 06/23/2018   BILITOT 0.8 06/23/2018   ALKPHOS 58 06/23/2018   AST 19 06/23/2018   ALT 13 06/23/2018   PROT 6.7 06/23/2018   ALBUMIN 4.3 06/23/2018   CALCIUM 9.1 06/23/2018   ANIONGAP 11 11/19/2017   GFR 71.96 06/23/2018   Lab Results  Component Value Date   CHOL 133 06/23/2018   Lab Results  Component Value Date   HDL 58.60 06/23/2018   Lab Results  Component Value Date   LDLCALC 58 06/23/2018   Lab Results  Component Value Date   TRIG 82.0 06/23/2018   Lab Results  Component Value Date   CHOLHDL 2  06/23/2018   No results found for: HGBA1C     Assessment & Plan:   Problem List Items Addressed This Visit    Hyperlipidemia    Encouraged heart healthy diet, increase exercise, avoid trans fats, consider a krill oil cap daily. Tolerating simvastatin 10 mg       Relevant Medications   hydrochlorothiazide (HYDRODIURIL) 25 MG tablet   simvastatin (ZOCOR) 20 MG tablet   Other Relevant Orders   Lipid panel (Completed)   Hypertension    Well controlled, no changes to meds. Encouraged heart healthy diet such as the DASH diet and exercise as tolerated.       Relevant Medications   hydrochlorothiazide (HYDRODIURIL) 25 MG tablet   simvastatin (ZOCOR) 20 MG tablet   Other Relevant Orders   CBC with Differential/Platelet (Completed)   Comprehensive metabolic panel (Completed)   TSH (Completed)   Cough    Cough non productive for several months, sob, likely multifactorial. Encouraged antihistamines and consider H2 blockers. Referred to cardiology forconsideration      Relevant Orders   CBC with Differential/Platelet (Completed)   CT Chest Wo Contrast   Bradycardia    Referred to cadiology for futher consideration      Relevant Orders   Ambulatory referral to Cardiology    Other Visit Diagnoses    Increased heart rate    -  Primary   Relevant Orders   EKG 12-Lead (Completed)  I have discontinued Dameer O. Lepera's sertraline. I have also changed his hydrochlorothiazide and simvastatin. Additionally, I am having him start on sertraline. Lastly, I am having him maintain his Multiple Vitamins-Minerals (OCUVITE ADULT 50+ PO), clopidogrel, terazosin, and Albuterol Sulfate.  Meds ordered this encounter  Medications  . hydrochlorothiazide (HYDRODIURIL) 25 MG tablet    Sig: Take 1 tablet (25 mg total) by mouth daily.    Dispense:  90 tablet    Refill:  1  . simvastatin (ZOCOR) 20 MG tablet    Sig: Take 0.5 tablets (10 mg total) by mouth every evening.    Dispense:  45 tablet     Refill:  1  . sertraline (ZOLOFT) 25 MG tablet    Sig: Take 1 tablet (25 mg total) by mouth daily.    Dispense:  90 tablet    Refill:  1     Penni Homans, MD

## 2018-06-28 NOTE — Assessment & Plan Note (Signed)
Referred to cadiology for futher consideration

## 2018-06-29 ENCOUNTER — Ambulatory Visit: Payer: Medicare HMO | Admitting: Neurology

## 2018-06-29 ENCOUNTER — Encounter: Payer: Self-pay | Admitting: Neurology

## 2018-06-29 ENCOUNTER — Encounter: Payer: Self-pay | Admitting: Family Medicine

## 2018-06-29 VITALS — BP 105/68 | HR 69 | Wt 183.0 lb

## 2018-06-29 DIAGNOSIS — G459 Transient cerebral ischemic attack, unspecified: Secondary | ICD-10-CM | POA: Diagnosis not present

## 2018-06-29 NOTE — Patient Instructions (Signed)
I had a long discussion of the patient with regards to his remote episode of possible TIA and discuss results of EEG study as well as CT angiogram both of which were unremarkable. I recommend that he stay on Plavix for stroke prevention and maintain strict control of hypertension with blood pressure goal below 130/90 and lipids with LDL cholesterol goal below 70 mg percent. He had a recent episode of shortness of breath and palpitations with exertion. Recommend he continue follow-up with his primary care physician and schedule appointment with cardiologist to have this evaluated further. He may return for follow-up with me in the future only as necessary and no scheduled appointment was made

## 2018-06-29 NOTE — Progress Notes (Signed)
[facility] [address] [place]. [place] [address] [phone]       OFFICE CONSULT NOTE  Walter Brown Date of Birth:  [birth year] Medical Record Number:  [id number]   Referring MD:  Willette Alma  Reason for Referral: TIA  HPI: Initial consult  04/21/2018 ; Mr  Brown is a pleasant 77 year old male seen today for initial consultation visit for possible TIA. History is obtained from the patient and review of electronic medical records in care everywhere. I personally reviewed imaging films. The patient was driving with his wife to [place] on 02/27/18 when he felt that the white lines dividing the [place] was swelling. He felt the line was spitting into 2. His wife asked him to pull over. The patient felt wobbly when he walked out of the car and sat down. The wife drove but noticed that her husband was sleepy and was mumbling. She drove him to urgent care in [place] very well evaluated and subsequently was transferred to [facility]. Patient states he does not remember the details very clearly. Patient states he started improving by the time he reached the hospital in 30-60 minutes later. He denied any accompanying headache slurred speech or focal extremity weakness or numbness. He did feel tired and sleepy and could not number things well. There is no tongue bite tonic-clonic activity or incontinence noted. He has no known prior history of strokes, TIAs, seizures or significant head injury with loss of consciousness. The patient had an outpatient echocardiogram done which was unremarkable. He also had MRI scan of the brain as well as MRA of the brain and neck on 02/28/18 [facility] all of which were unremarkable. He in fact also had  Zio   heart patch monitor for 2 weeks and no significant cardiac arrhythmias were found. Patient has been taking aspirin 80 mg daily as well as simvastatin was added for elevated lipids by primary physician. Patient does  have family history of stroke in his dad but there is no history of seizures. He does give history of multiple minor concussions in his youth while playing football he was never hospitalized. He feels his back to his baseline and has no complaints today. Update 06/29/2018. Patient returns for follow-up after last visit 2 months ago. He is accompanied by his wife. He states his abdominal for episodes of confusion or TIAs. He however had episode of shortness of breath and palpitations and according to sweat while he was hiking with his wife. He had to rest quite a bit and walk slowly and took a lot of time to complete the headache. He subsequently saw [place] primary care physician who saw him recently and ordered CT scan of the chest which has not been scheduled. He was also referred to cardiologist but has not had that appointment yet. Review of his chart shows that he had EEG done on 04/24/18 which was normal and nuclear cardiac stress test on 04/23/18 was also normal. He had lab work done on 06/22/18 which showed LDL cholesterol 58 mg percent. His platelet count was up to 1 28,000. He is tolerating Plavix well without bruising or bleeding. He had no recurrent stroke or TIA or seizure like episodes. ROS:   14 system review of systems is positive for  hearing loss, snoring,palpitations, depression and all other systems negative PMH:  Past Medical History:  Diagnosis Date  . Anemia    mild  . Arthritis 04/06/2017  . Asthma  childhood  . Basal cell carcinoma    skin- on nose- basal cell (20 yrs ago) forehead 1 year ago  . BPH (benign prostatic hyperplasia)   . Cerumen impaction 08/10/2012  . Chicken pox as child  . Depression 08/01/2012  . [place] measles as a child  . Hyperlipidemia   . Hypertension   . Macular degeneration   . Medicare annual wellness visit, subsequent 11/07/2013  . Otitis externa 08/10/2012  . Preventative health care 08/01/2012  . Sleep apnea   . Stroke ([place])   . Thrombocytopenia  ([place])     Social History:  Social History   Socioeconomic History  . Marital status: Married    Spouse name: Not on file  . Number of children: 2  . Years of education: Not on file  . Highest education level: Not on file  Occupational History  . Occupation: retired  Scientific laboratory technician  . Financial resource strain: Not on file  . Food insecurity:    Worry: Not on file    Inability: Not on file  . Transportation needs:    Medical: Not on file    Non-medical: Not on file  Tobacco Use  . Smoking status: Former Smoker    Packs/day: 1.50    Years: 15.00    Pack years: 22.50    Start date: 10/07/1978  . Smokeless tobacco: Never Used  Substance and Sexual Activity  . Alcohol use: Yes    Comment: 2 glasses of wine daily  . Drug use: No  . 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  Lifestyle  . Physical activity:    Days per week: Not on file    Minutes per session: Not on file  . Stress: Not on file  Relationships  . Social connections:    Talks on phone: Not on file    Gets together: Not on file    Attends religious service: Not on file    Active member of club or organization: Not on file    Attends meetings of clubs or organizations: Not on file    Relationship status: Not on file  . Intimate partner violence:    Fear of current or ex partner: Not on file    Emotionally abused: Not on file    Physically abused: Not on file    Forced sexual activity: Not on file  Other Topics Concern  . Not on file  Social History Narrative  . Not on file    Medications:   Current Outpatient Medications on File Prior to Visit  Medication Sig Dispense Refill  . Albuterol Sulfate 108 (90 Base) MCG/ACT AEPB Inhale 1-2 puffs into the lungs every 6 (six) hours as needed (Shortness of breath or cough). 1 each 1  . clopidogrel (PLAVIX) 75 MG tablet Take 1 tablet (75 mg total) by mouth daily. 30  tablet 11  . hydrochlorothiazide (HYDRODIURIL) 25 MG tablet Take 1 tablet (25 mg total) by mouth daily. 90 tablet 1  . Multiple Vitamins-Minerals (OCUVITE ADULT 50+ PO) Take 1 tablet by mouth daily.    . sertraline (ZOLOFT) 25 MG tablet Take 1 tablet (25 mg total) by mouth daily. 90 tablet 1  . sertraline (ZOLOFT) 50 MG tablet Take by mouth.    . simvastatin (ZOCOR) 20 MG tablet Take 0.5 tablets (10 mg total) by mouth every evening. 45 tablet 1  . terazosin (HYTRIN) 10 MG capsule Take  10 mg by mouth at bedtime.     No current facility-administered medications on file prior to visit.     Allergies:   Allergies  Allergen Reactions  . Azithromycin Other (See Comments)    Hepatotoxicity  . Doxycycline Hyclate Other (See Comments)    Taken with Azithromycin and had Heaptotoxicity    Physical Exam General: well developed, well nourished elderly Caucasian male, seated, in no evident distress Head: head normocephalic and atraumatic.   Neck: supple with no carotid or supraclavicular bruits Cardiovascular: regular rate and rhythm, no murmurs Musculoskeletal: no deformity Skin:  no rash/petichiae Vascular:  Normal pulses all extremities  Neurologic Exam Mental Status: Awake and fully alert. Oriented to place and time. Recent and remote memory intact. Attention span, concentration and fund of knowledge appropriate. Mood and affect appropriate.  Cranial Nerves: Fundoscopic exam not done. Pupils equal, briskly reactive to light. Extraocular movements full without nystagmus. Visual fields full to confrontation. Hearing slightly decreased bilaterally despite hearing aids.. Facial sensation intact. Face, tongue, palate moves normally and symmetrically.  Motor: Normal bulk and tone. Normal strength in all tested extremity muscles. Sensory.: intact to touch , pinprick , position and vibratory sensation.  Coordination: Rapid alternating movements normal in all extremities. Finger-to-nose and  heel-to-shin performed accurately bilaterally. Gait and Station: Arises from chair without difficulty. Stance is normal. Gait demonstrates normal stride length and balance . Able to heel, toe and tandem walk without difficulty.  Reflexes: 1+ and symmetric. Toes downgoing.       ASSESSMENT: 77 year old Caucasian male with transient episode of vision disturbance and altered consciousness possibly posterior circulation TIA versus complex partial seizure.    PLAN: I had a long discussion of the patient with regards to his remote episode of possible TIA and discuss results of EEG study as well as CT angiogram both of which were unremarkable. I recommend that he stay on Plavix for stroke prevention and maintain strict control of hypertension with blood pressure goal below 130/90 and lipids with LDL cholesterol goal below 70 mg percent. He had a recent episode of shortness of breath and palpitations with exertion. Recommend he continue follow-up with his primary care physician and schedule appointment with cardiologist to have this evaluated further. He may return for follow-up with me in the future only as necessary and no scheduled appointment was made Greater than 50% time during this 25 minute visit was spent on counseling and coordination of care about TIA and complex versus seizure and answering questions Antony Contras, MD  [facility] [address] [place], [place] [address]  Phone [phone] Fax [phone] Note: This document was prepared with digital dictation and possible smart phrase technology. Any transcriptional errors that result from this process are unintentional.

## 2018-07-02 ENCOUNTER — Ambulatory Visit: Payer: Self-pay | Admitting: Family Medicine

## 2018-07-04 ENCOUNTER — Ambulatory Visit (HOSPITAL_BASED_OUTPATIENT_CLINIC_OR_DEPARTMENT_OTHER)
Admission: RE | Admit: 2018-07-04 | Discharge: 2018-07-04 | Disposition: A | Discharge status: Home / Self Care | Payer: Medicare HMO | Source: Ambulatory Visit | Attending: Family Medicine | Admitting: Family Medicine

## 2018-07-04 DIAGNOSIS — R000 Tachycardia, unspecified: Secondary | ICD-10-CM | POA: Diagnosis not present

## 2018-07-04 DIAGNOSIS — R05 Cough: Secondary | ICD-10-CM | POA: Insufficient documentation

## 2018-07-04 DIAGNOSIS — I2510 Atherosclerotic heart disease of native coronary artery without angina pectoris: Secondary | ICD-10-CM | POA: Diagnosis not present

## 2018-07-04 DIAGNOSIS — I712 Thoracic aortic aneurysm, without rupture: Secondary | ICD-10-CM | POA: Insufficient documentation

## 2018-07-04 DIAGNOSIS — R059 Cough, unspecified: Secondary | ICD-10-CM

## 2018-07-04 DIAGNOSIS — I700 Atherosclerosis of aorta: Secondary | ICD-10-CM | POA: Insufficient documentation

## 2018-07-04 DIAGNOSIS — R0602 Shortness of breath: Secondary | ICD-10-CM | POA: Diagnosis not present

## 2018-07-04 DIAGNOSIS — R918 Other nonspecific abnormal finding of lung field: Secondary | ICD-10-CM | POA: Insufficient documentation

## 2018-07-04 DIAGNOSIS — M629 Disorder of muscle, unspecified: Secondary | ICD-10-CM | POA: Insufficient documentation

## 2018-07-06 ENCOUNTER — Other Ambulatory Visit: Payer: Self-pay | Admitting: Family Medicine

## 2018-07-06 DIAGNOSIS — J9811 Atelectasis: Secondary | ICD-10-CM

## 2018-07-06 DIAGNOSIS — R059 Cough, unspecified: Secondary | ICD-10-CM

## 2018-07-06 DIAGNOSIS — M25511 Pain in right shoulder: Secondary | ICD-10-CM

## 2018-07-06 DIAGNOSIS — J45909 Unspecified asthma, uncomplicated: Secondary | ICD-10-CM

## 2018-07-06 DIAGNOSIS — R05 Cough: Secondary | ICD-10-CM

## 2018-07-10 ENCOUNTER — Encounter: Payer: Self-pay | Admitting: Family Medicine

## 2018-07-13 ENCOUNTER — Ambulatory Visit: Payer: Self-pay | Admitting: Family Medicine

## 2018-07-14 ENCOUNTER — Encounter: Payer: Self-pay | Admitting: Pulmonary Disease

## 2018-07-14 ENCOUNTER — Other Ambulatory Visit: Payer: Self-pay | Admitting: Pulmonary Disease

## 2018-07-14 ENCOUNTER — Ambulatory Visit (INDEPENDENT_AMBULATORY_CARE_PROVIDER_SITE_OTHER): Payer: Medicare HMO | Admitting: Pulmonary Disease

## 2018-07-14 ENCOUNTER — Ambulatory Visit: Payer: Medicare HMO | Admitting: Pulmonary Disease

## 2018-07-14 VITALS — BP 122/72 | HR 63 | Ht 66.0 in | Wt 181.0 lb

## 2018-07-14 DIAGNOSIS — L814 Other melanin hyperpigmentation: Secondary | ICD-10-CM | POA: Diagnosis not present

## 2018-07-14 DIAGNOSIS — R059 Cough, unspecified: Secondary | ICD-10-CM

## 2018-07-14 DIAGNOSIS — R05 Cough: Secondary | ICD-10-CM

## 2018-07-14 DIAGNOSIS — Z86018 Personal history of other benign neoplasm: Secondary | ICD-10-CM | POA: Diagnosis not present

## 2018-07-14 DIAGNOSIS — L821 Other seborrheic keratosis: Secondary | ICD-10-CM | POA: Diagnosis not present

## 2018-07-14 DIAGNOSIS — D1801 Hemangioma of skin and subcutaneous tissue: Secondary | ICD-10-CM | POA: Diagnosis not present

## 2018-07-14 DIAGNOSIS — D225 Melanocytic nevi of trunk: Secondary | ICD-10-CM | POA: Diagnosis not present

## 2018-07-14 DIAGNOSIS — R911 Solitary pulmonary nodule: Secondary | ICD-10-CM

## 2018-07-14 DIAGNOSIS — D485 Neoplasm of uncertain behavior of skin: Secondary | ICD-10-CM | POA: Diagnosis not present

## 2018-07-14 DIAGNOSIS — D171 Benign lipomatous neoplasm of skin and subcutaneous tissue of trunk: Secondary | ICD-10-CM | POA: Diagnosis not present

## 2018-07-14 DIAGNOSIS — L570 Actinic keratosis: Secondary | ICD-10-CM | POA: Diagnosis not present

## 2018-07-14 DIAGNOSIS — Z85828 Personal history of other malignant neoplasm of skin: Secondary | ICD-10-CM | POA: Diagnosis not present

## 2018-07-14 DIAGNOSIS — Z23 Encounter for immunization: Secondary | ICD-10-CM | POA: Diagnosis not present

## 2018-07-14 LAB — PULMONARY FUNCTION TEST
DL/VA % pred: 108 %
DL/VA: 4.67 ml/min/mmHg/L
DLCO unc % pred: 97 %
DLCO unc: 26.37 ml/min/mmHg
FEF 25-75 Post: 2.96 L/sec
FEF 25-75 Pre: 3.07 L/sec
FEF2575-%Change-Post: -3 %
FEF2575-%Pred-Post: 169 %
FEF2575-%Pred-Pre: 175 %
FEV1-%Change-Post: -1 %
FEV1-%PRED-PRE: 112 %
FEV1-%Pred-Post: 110 %
FEV1-POST: 2.75 L
FEV1-PRE: 2.8 L
FEV1FVC-%CHANGE-POST: 0 %
FEV1FVC-%Pred-Pre: 116 %
FEV6-%CHANGE-POST: -1 %
FEV6-%PRED-PRE: 102 %
FEV6-%Pred-Post: 101 %
FEV6-POST: 3.3 L
FEV6-PRE: 3.33 L
FEV6FVC-%PRED-PRE: 107 %
FEV6FVC-%Pred-Post: 107 %
FVC-%Change-Post: -1 %
FVC-%PRED-PRE: 95 %
FVC-%Pred-Post: 94 %
FVC-POST: 3.3 L
FVC-PRE: 3.34 L
POST FEV6/FVC RATIO: 100 %
Post FEV1/FVC ratio: 83 %
Pre FEV1/FVC ratio: 84 %
Pre FEV6/FVC Ratio: 100 %
RV % pred: 247 %
RV: 5.9 L
TLC % PRED: 158 %
TLC: 9.9 L

## 2018-07-14 MED ORDER — PREDNISONE 10 MG PO TABS: 10.0000 mg | ORAL_TABLET | Freq: Two times a day (BID) | ORAL | 0 refills | Status: DC

## 2018-07-14 MED ORDER — CLARITHROMYCIN 500 MG PO TABS: 500.0000 mg | ORAL_TABLET | Freq: Two times a day (BID) | ORAL | 0 refills | Status: DC

## 2018-07-14 NOTE — Progress Notes (Signed)
PFT completed today.Katie Welchel,CMA  

## 2018-07-14 NOTE — Patient Instructions (Signed)
Abnormal CT scan of the chest showing basal atelectasis, endobronchial lesion likely secretions  CT is reviewed with you  We will obtain a pulmonary function study-assess for obstructive lung disease, based on your remote history of smoking  We will repeat CT in 3 months to follow-up on findings at the base of the lung and also the airway  Call with any significant changes/questions

## 2018-07-14 NOTE — Progress Notes (Signed)
Subjective:    Patient ID: Walter Brown, male    DOB: [birth year], 77 y.o.   MRN: [id number] Chief complaint-chronic cough for about a year, CT scan of the chest showing atelectasis, nodules  HPI: History of a chronic cough about a year, abnormal CT scan of the chest Brings up clear secretions Not feeling acutely ill Symptoms started about a year ago when he was exposed to somebody who had what appeared to be an upper respiratory infection Has no chest pains or chest discomfort, no hemoptysis, no weight loss Still stays very active Past history of basal cell carcinoma on the nasal bridge Reformed smoker-quit many years ago over 30 years ago  He had a CT scan of the chest showing what appears to be mucus in the airway and atelectasis at the base of the right lung, small nodules in the lung  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  HENT: Negative for congestion, dental problem, ear pain, nosebleeds, postnasal drip, rhinorrhea, sinus pressure, sneezing, sore throat and trouble swallowing.   Eyes: Negative for redness and itching.  Respiratory: Positive for shortness of breath. Negative for cough, chest tightness and wheezing.   Cardiovascular: Negative for palpitations and leg swelling.  Gastrointestinal: Negative for nausea and vomiting.  Genitourinary: Negative for dysuria.  Musculoskeletal: Negative for joint swelling.  Skin: Negative for rash.  Allergic/Immunologic: Negative.  Negative for environmental allergies, food allergies and immunocompromised state.  Neurological: Negative for headaches.  Hematological: Does not bruise/bleed easily.  Psychiatric/Behavioral: Negative for dysphoric mood. The patient is not nervous/anxious.    Past Medical History:  Diagnosis Date  . Anemia    mild  . Arthritis 04/06/2017  . Asthma    childhood  . Basal cell carcinoma    skin- on nose- basal cell (20 yrs ago) forehead 1 year ago  . BPH (benign prostatic hyperplasia)   . Cerumen impaction 08/10/2012  . Chicken pox as child  . Depression 08/01/2012  . [place] measles as a child  . Hyperlipidemia   . Hypertension   . Macular degeneration   . Medicare annual wellness visit, subsequent 11/07/2013  . Otitis externa 08/10/2012  . Preventative health care 08/01/2012  . Sleep apnea   . Stroke ([place])   . Thrombocytopenia ([place])    Past Surgical History:  Procedure Laterality Date  . [place]     very young, b/l  . EXCISIONAL HEMORRHOIDECTOMY    . HYDROCELE EXCISION / REPAIR  2012   b/l  . SKIN CANCER EXCISION     nose and forehead   .  No significant contributory family history  Reformed smoker No significant history of alcohol use     Objective:   Physical Exam  Constitutional: He is oriented to person, place, and time. He appears well-developed and well-nourished. No distress.  HENT:  Head: Normocephalic and atraumatic.  Mouth/Throat: No oropharyngeal exudate.  Eyes: Pupils are equal, round, and reactive to light. EOM are normal. Right eye exhibits no discharge. Left eye exhibits no discharge.  Neck: Normal range of motion. Neck supple. No tracheal deviation present. No thyromegaly present.  Cardiovascular: Normal rate and regular rhythm. Exam reveals no friction rub.  No murmur heard. Pulmonary/Chest: Effort normal and breath sounds normal.  Abdominal: Soft. Bowel sounds are normal. He exhibits no distension. There is no tenderness.  Musculoskeletal: Normal range of motion. He exhibits no edema.  Neurological: He is alert and oriented to person, place, and time. No cranial nerve deficit.  Skin: Skin is warm and dry. He is not diaphoretic.  Psychiatric: He has a normal mood and affect.   CT scan was reviewed by myself with the patient Compared with recent  abdominal CT performed in February 2019     Assessment & Plan:.  .  Chronic cough -Cough started following exposure to somebody with an upper respiratory infection -Coughing bringing up clear secretions -Has not felt acutely ill  .  Abnormal CT scan of the chest showing lung nodules -Structure of the findings likely related to atelectasis -endobronchial lesion likely related to secretions in the airway  .  Endobronchial lesion likely secretion -Highly likely related to secretions in the airway -Bronchoscopy was discussed, yield will be very low in the situation  .  Atelectasis right base -Finding more consistent with atelectasis -The possibility of this being a mass/growth is low.  But definitely needs follow-up   .  Past history of asthma -Has no symptoms suggestive of an exacerbation  Plan:  Repeat CT scan of the chest in 3 months Obtain a pulmonary function study  Findings clearly reviewed with patient Bronchoscopy discussed Further evaluation regarding lung nodules discussed Following up the atelectasis at the base of the lung is appropriate in this situation  Encouraged to call if any significant changes in symptoms

## 2018-07-16 ENCOUNTER — Telehealth: Payer: Self-pay | Admitting: Pulmonary Disease

## 2018-07-16 NOTE — Telephone Encounter (Signed)
Spoke with pt. He is requesting his PFT results.  Dr. Ander Slade - please advise. Thanks.

## 2018-07-20 NOTE — Telephone Encounter (Signed)
Normal PFT  Large lung volumes - may be seen in athletes, someone that is physically very fit - May also be seen in people with emphysema- this is less likely as his study does not show any obstruction

## 2018-07-20 NOTE — Telephone Encounter (Signed)
Called and spoke with pt letting him know the results of PFT.  Pt expressed understanding. Nothing further needed.

## 2018-07-23 ENCOUNTER — Telehealth: Payer: Self-pay | Admitting: Pulmonary Disease

## 2018-07-23 NOTE — Telephone Encounter (Signed)
Spoke with the pt  He states he does not recall the results of PFT  I went over them with him again- see PN dated 07/16/18  He verbalized understanding and nothing further needed per pt

## 2018-08-03 ENCOUNTER — Encounter: Payer: Self-pay | Admitting: Family Medicine

## 2018-08-04 ENCOUNTER — Encounter: Payer: Self-pay | Admitting: Family Medicine

## 2018-08-04 ENCOUNTER — Ambulatory Visit (INDEPENDENT_AMBULATORY_CARE_PROVIDER_SITE_OTHER): Payer: Medicare HMO | Admitting: Family Medicine

## 2018-08-04 VITALS — BP 122/72 | HR 69 | Temp 98.3°F | Resp 18 | Wt 181.8 lb

## 2018-08-04 DIAGNOSIS — I10 Essential (primary) hypertension: Secondary | ICD-10-CM | POA: Diagnosis not present

## 2018-08-04 DIAGNOSIS — D179 Benign lipomatous neoplasm, unspecified: Secondary | ICD-10-CM

## 2018-08-04 DIAGNOSIS — D171 Benign lipomatous neoplasm of skin and subcutaneous tissue of trunk: Secondary | ICD-10-CM | POA: Diagnosis not present

## 2018-08-04 HISTORY — DX: Benign lipomatous neoplasm, unspecified: D17.9

## 2018-08-04 NOTE — Progress Notes (Signed)
Subjective:    Patient ID: Walter Brown, male    DOB: 08/03/1941, 77 y.o.   MRN: [id number]  No chief complaint on file.   HPI Patient is in today for follow-up.  He is feeling some better today.  Took a trip out to [place] last week to visit his son and that was enjoyable and not difficult.  He has not been using the albuterol he was given due to a sense of tachycardia and palpitations when he used it.  He is also had no severe episodes of shortness of breath although he does note some shortness of breath with exertion.  Has a dry cough still but it is not worsening nor keeping him up at night.  No fevers or chills.  No significant head congestion. Denies CP/palp/HA/congestion/fevers/GI or GU c/o. Taking meds as prescribed  Past Medical History:  Diagnosis Date  . Anemia    mild  . Arthritis 04/06/2017  . Asthma    childhood  . Basal cell carcinoma    skin- on nose- basal cell (20 yrs ago) forehead 1 year ago  . BPH (benign prostatic hyperplasia)   . Cerumen impaction 08/10/2012  . Chicken pox as child  . Depression 08/01/2012  . [place] measles as a child  . Hyperlipidemia   . Hypertension   . Macular degeneration   . Medicare annual wellness visit, subsequent 11/07/2013  . Otitis externa 08/10/2012  . Preventative health care 08/01/2012  . Sleep apnea   . Stroke ([place])   . Thrombocytopenia ([place])     Past Surgical History:  Procedure Laterality Date  . [place]     very young, b/l  . EXCISIONAL HEMORRHOIDECTOMY    . HYDROCELE EXCISION / REPAIR  2012   b/l  . SKIN CANCER EXCISION     nose and forehead    Family History  Problem Relation Age of Onset  . Hypertension Mother   . Other Mother        MRSA  . Heart disease Mother        stents  . Parkinsonism Father   . Heart disease Father   . Hypertension Father   . Prostate cancer Father 85  . Prostate cancer Maternal Grandfather   . ADD / ADHD Son        ADHD  . Other Son 24       part of 1 lung  removed- due to infection  . Stroke Maternal Grandmother   . Cancer Paternal Grandfather        EYE    Social History   Socioeconomic History  . Marital status: Married    Spouse name: Not on file  . Number of children: 2  . Years of education: Not on file  . Highest education level: Not on file  Occupational History  . Occupation: retired  Scientific laboratory technician  . Financial resource strain: Not on file  . Food insecurity:    Worry: Not on file    Inability: Not on file  . Transportation needs:    Medical: Not on file    Non-medical: Not on file  Tobacco Use  . Smoking status: Former Smoker    Packs/day: 1.50    Years: 15.00    Pack years: 22.50    Start date: 10/07/1978  . Smokeless tobacco: Never Used  Substance and Sexual Activity  . Alcohol use: Yes    Comment: 2 glasses of wine daily  . Drug use: No  .  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  Lifestyle  . Physical activity:    Days per week: Not on file    Minutes per session: Not on file  . Stress: Not on file  Relationships  . Social connections:    Talks on phone: Not on file    Gets together: Not on file    Attends religious service: Not on file    Active member of club or organization: Not on file    Attends meetings of clubs or organizations: Not on file    Relationship status: Not on file  . Intimate partner violence:    Fear of current or ex partner: Not on file    Emotionally abused: Not on file    Physically abused: Not on file    Forced sexual activity: Not on file  Other Topics Concern  . Not on file  Social History Narrative  . Not on file    Outpatient Medications Prior to Visit  Medication Sig Dispense Refill  . clopidogrel (PLAVIX) 75 MG tablet Take 1 tablet (75 mg total) by mouth daily. 30 tablet 11  . hydrochlorothiazide (HYDRODIURIL) 25 MG tablet Take 1 tablet (25 mg total) by mouth daily. 90  tablet 1  . Multiple Vitamins-Minerals (OCUVITE ADULT 50+ PO) Take 1 tablet by mouth daily.    . sertraline (ZOLOFT) 25 MG tablet Take 1 tablet (25 mg total) by mouth daily. 90 tablet 1  . simvastatin (ZOCOR) 20 MG tablet Take 0.5 tablets (10 mg total) by mouth every evening. 45 tablet 1  . terazosin (HYTRIN) 10 MG capsule Take 10 mg by mouth at bedtime.    . Albuterol Sulfate 108 (90 Base) MCG/ACT AEPB Inhale 1-2 puffs into the lungs every 6 (six) hours as needed (Shortness of breath or cough). 1 each 1  . sertraline (ZOLOFT) 50 MG tablet Take by mouth.     No facility-administered medications prior to visit.     Allergies  Allergen Reactions  . Albuterol Sulfate Palpitations  . Azithromycin Other (See Comments)    Hepatotoxicity  . Doxycycline Hyclate Other (See Comments)    Taken with Azithromycin and had Heaptotoxicity    Review of Systems  Constitutional: Negative for fever and malaise/fatigue.  HENT: Negative for congestion.   Eyes: Negative for blurred vision and redness.  Respiratory: Positive for cough and shortness of breath. Negative for sputum production.   Cardiovascular: Positive for palpitations. Negative for chest pain and leg swelling.  Gastrointestinal: Negative for abdominal pain, blood in stool and nausea.  Genitourinary: Negative for dysuria and frequency.  Musculoskeletal: Negative for falls.  Skin: Negative for rash.  Neurological: Negative for dizziness, loss of consciousness and headaches.  Endo/Heme/Allergies: Negative for environmental allergies.  Psychiatric/Behavioral: Negative for depression. The patient is not nervous/anxious.        Objective:    Physical Exam  Constitutional: He is oriented to person, place, and time. He appears well-developed and well-nourished. No distress.  HENT:  Head: Normocephalic and atraumatic.  Nose: Nose normal.  Eyes: Right eye exhibits no discharge. Left eye exhibits no discharge.  Neck: Normal range of motion.  Neck supple.  Cardiovascular: Normal rate and regular rhythm.  No murmur heard. Pulmonary/Chest: Effort normal and breath sounds normal.  Abdominal: Soft. Bowel sounds are normal. There is no tenderness.  Musculoskeletal: He exhibits no edema.  Neurological: He is alert and oriented to person,  place, and time.  Skin: Skin is warm and dry.  Psychiatric: He has a normal mood and affect.  Nursing note and vitals reviewed.   BP 122/72 (BP Location: Left Arm, Patient Position: Sitting, Cuff Size: Normal)   Pulse 69   Temp 98.3 F (36.8 C) (Oral)   Resp 18   Wt 181 lb 12.8 oz (82.5 kg)   SpO2 97%   BMI 29.34 kg/m  Wt Readings from Last 3 Encounters:  08/04/18 181 lb 12.8 oz (82.5 kg)  07/14/18 181 lb (82.1 kg)  06/29/18 183 lb (83 kg)     Lab Results  Component Value Date   WBC 6.5 06/23/2018   HGB 14.6 06/23/2018   HCT 42.0 06/23/2018   PLT 128.0 (L) 06/23/2018   GLUCOSE 78 06/23/2018   CHOL 133 06/23/2018   TRIG 82.0 06/23/2018   HDL 58.60 06/23/2018   LDLCALC 58 06/23/2018   ALT 13 06/23/2018   AST 19 06/23/2018   NA 139 06/23/2018   K 3.9 06/23/2018   CL 102 06/23/2018   CREATININE 1.06 06/23/2018   BUN 15 06/23/2018   CO2 27 06/23/2018   TSH 1.71 06/23/2018   INR 1.0 12/15/2017    Lab Results  Component Value Date   TSH 1.71 06/23/2018   Lab Results  Component Value Date   WBC 6.5 06/23/2018   HGB 14.6 06/23/2018   HCT 42.0 06/23/2018   MCV 91.6 06/23/2018   PLT 128.0 (L) 06/23/2018   Lab Results  Component Value Date   NA 139 06/23/2018   K 3.9 06/23/2018   CO2 27 06/23/2018   GLUCOSE 78 06/23/2018   BUN 15 06/23/2018   CREATININE 1.06 06/23/2018   BILITOT 0.8 06/23/2018   ALKPHOS 58 06/23/2018   AST 19 06/23/2018   ALT 13 06/23/2018   PROT 6.7 06/23/2018   ALBUMIN 4.3 06/23/2018   CALCIUM 9.1 06/23/2018   ANIONGAP 11 11/19/2017   GFR 71.96 06/23/2018   Lab Results  Component Value Date   CHOL 133 06/23/2018   Lab Results    Component Value Date   HDL 58.60 06/23/2018   Lab Results  Component Value Date   LDLCALC 58 06/23/2018   Lab Results  Component Value Date   TRIG 82.0 06/23/2018   Lab Results  Component Value Date   CHOLHDL 2 06/23/2018   No results found for: HGBA1C     Assessment & Plan:   Problem List Items Addressed This Visit    Hypertension    Well controlled, no changes to meds. Encouraged heart healthy diet such as the DASH diet and exercise as tolerated.       Lipoma - Primary    Recent CT scan just revealed. Fatty lesion right paracentral posterior musculature (series 2, image 108 and series 6, image 78) spanning up to 3.2 cm. This may represent a small lipoma. Low-grade liposarcoma cannot be excluded given the slight stranding of central aspect. Referred to General surgery for further consideration      Relevant Orders   Ambulatory referral to General Surgery      I have discontinued Akiel O. Casanova's Albuterol Sulfate. I am also having him maintain his Multiple Vitamins-Minerals (OCUVITE ADULT 50+ PO), clopidogrel, terazosin, hydrochlorothiazide, simvastatin, and sertraline.      No orders of the defined types were placed in this encounter.   Penni Homans, MD

## 2018-08-04 NOTE — Assessment & Plan Note (Signed)
Well controlled, no changes to meds. Encouraged heart healthy diet such as the DASH diet and exercise as tolerated.  °

## 2018-08-04 NOTE — Assessment & Plan Note (Signed)
Recent CT scan just revealed. Fatty lesion right paracentral posterior musculature (series 2, image 108 and series 6, image 78) spanning up to 3.2 cm. This may represent a small lipoma. Low-grade liposarcoma cannot be excluded given the slight stranding of central aspect. Referred to General surgery for further consideration

## 2018-08-04 NOTE — Patient Instructions (Addendum)
Can try taking 1/2 tab of Sertraline daily for a month if no flare in anxiety, depression et then can stop if you would like  Can drop your dose of HCTZ tab to 1/2 tab daily til seen again  Cough, Adult Coughing is a reflex that clears your throat and your airways. Coughing helps to heal and protect your lungs. It is normal to cough occasionally, but a cough that happens with other symptoms or lasts a long time may be a sign of a condition that needs treatment. A cough may last only 2-3 weeks (acute), or it may last longer than 8 weeks (chronic). What are the causes? Coughing is commonly caused by:  Breathing in substances that irritate your lungs.  A viral or bacterial respiratory infection.  Allergies.  Asthma.  Postnasal drip.  Smoking.  Acid backing up from the stomach into the esophagus (gastroesophageal reflux).  Certain medicines.  Chronic lung problems, including COPD (or rarely, lung cancer).  Other medical conditions such as heart failure.  Follow these instructions at home: Pay attention to any changes in your symptoms. Take these actions to help with your discomfort:  Take medicines only as told by your health care provider. ? If you were prescribed an antibiotic medicine, take it as told by your health care provider. Do not stop taking the antibiotic even if you start to feel better. ? Talk with your health care provider before you take a cough suppressant medicine.  Drink enough fluid to keep your urine clear or pale yellow.  If the air is dry, use a cold steam vaporizer or humidifier in your bedroom or your home to help loosen secretions.  Avoid anything that causes you to cough at work or at home.  If your cough is worse at night, try sleeping in a semi-upright position.  Avoid cigarette smoke. If you smoke, quit smoking. If you need help quitting, ask your health care provider.  Avoid caffeine.  Avoid alcohol.  Rest as needed.  Contact a health  care provider if:  You have new symptoms.  You cough up pus.  Your cough does not get better after 2-3 weeks, or your cough gets worse.  You cannot control your cough with suppressant medicines and you are losing sleep.  You develop pain that is getting worse or pain that is not controlled with pain medicines.  You have a fever.  You have unexplained weight loss.  You have night sweats. Get help right away if:  You cough up blood.  You have difficulty breathing.  Your heartbeat is very fast. This information is not intended to replace advice given to you by your health care provider. Make sure you discuss any questions you have with your health care provider. Document Released: 03/22/2011 Document Revised: 02/29/2016 Document Reviewed: 11/30/2014 Elsevier Interactive Patient Education  [employer].

## 2018-08-12 DIAGNOSIS — D1730 Benign lipomatous neoplasm of skin and subcutaneous tissue of unspecified sites: Secondary | ICD-10-CM | POA: Diagnosis not present

## 2018-08-25 ENCOUNTER — Encounter: Payer: Self-pay | Admitting: Family Medicine

## 2018-08-25 NOTE — Telephone Encounter (Signed)
Spoke with patient and advised him of Dr. Charlett Blake suggestion. He responded with "ok".  I advised with the symptoms that he is having that he needed to be seen soon. He just stated "ok"

## 2018-08-27 ENCOUNTER — Encounter (HOSPITAL_BASED_OUTPATIENT_CLINIC_OR_DEPARTMENT_OTHER): Payer: Self-pay | Admitting: *Deleted

## 2018-08-27 ENCOUNTER — Other Ambulatory Visit: Payer: Self-pay

## 2018-08-27 ENCOUNTER — Emergency Department (HOSPITAL_BASED_OUTPATIENT_CLINIC_OR_DEPARTMENT_OTHER)
Admission: EM | Admit: 2018-08-27 | Discharge: 2018-08-27 | Disposition: A | Discharge status: Home / Self Care | Payer: Medicare HMO | Attending: Emergency Medicine | Admitting: Emergency Medicine

## 2018-08-27 ENCOUNTER — Emergency Department (HOSPITAL_BASED_OUTPATIENT_CLINIC_OR_DEPARTMENT_OTHER): Payer: Medicare HMO

## 2018-08-27 DIAGNOSIS — J45909 Unspecified asthma, uncomplicated: Secondary | ICD-10-CM | POA: Diagnosis not present

## 2018-08-27 DIAGNOSIS — I10 Essential (primary) hypertension: Secondary | ICD-10-CM | POA: Insufficient documentation

## 2018-08-27 DIAGNOSIS — Z87891 Personal history of nicotine dependence: Secondary | ICD-10-CM | POA: Diagnosis not present

## 2018-08-27 DIAGNOSIS — Z7902 Long term (current) use of antithrombotics/antiplatelets: Secondary | ICD-10-CM | POA: Diagnosis not present

## 2018-08-27 DIAGNOSIS — R002 Palpitations: Secondary | ICD-10-CM | POA: Insufficient documentation

## 2018-08-27 DIAGNOSIS — Z79899 Other long term (current) drug therapy: Secondary | ICD-10-CM | POA: Diagnosis not present

## 2018-08-27 DIAGNOSIS — R7989 Other specified abnormal findings of blood chemistry: Secondary | ICD-10-CM | POA: Insufficient documentation

## 2018-08-27 DIAGNOSIS — R778 Other specified abnormalities of plasma proteins: Secondary | ICD-10-CM

## 2018-08-27 DIAGNOSIS — R079 Chest pain, unspecified: Secondary | ICD-10-CM | POA: Diagnosis present

## 2018-08-27 LAB — BASIC METABOLIC PANEL
ANION GAP: 12 (ref 5–15)
BUN: 22 mg/dL (ref 8–23)
CALCIUM: 9.4 mg/dL (ref 8.9–10.3)
CHLORIDE: 101 mmol/L (ref 98–111)
CO2: 25 mmol/L (ref 22–32)
Creatinine, Ser: 1.26 mg/dL — ABNORMAL HIGH (ref 0.61–1.24)
GFR calc non Af Amer: 53 mL/min — ABNORMAL LOW (ref >60)
GLUCOSE: 94 mg/dL (ref 70–99)
POTASSIUM: 3.5 mmol/L (ref 3.5–5.1)
Sodium: 138 mmol/L (ref 135–145)

## 2018-08-27 LAB — TROPONIN I
Troponin I: 0.07 ng/mL (ref <0.03)
Troponin I: 0.06 ng/mL (ref <0.03)

## 2018-08-27 LAB — CBC WITH DIFFERENTIAL/PLATELET
ABS IMMATURE GRANULOCYTES: 0.05 10*3/uL (ref 0.00–0.07)
BASOS PCT: 1 %
Basophils Absolute: 0.1 10*3/uL (ref 0.0–0.1)
Eosinophils Absolute: 0.1 10*3/uL (ref 0.0–0.5)
Eosinophils Relative: 1 %
HCT: 45.9 % (ref 39.0–52.0)
Hemoglobin: 15.5 g/dL (ref 13.0–17.0)
IMMATURE GRANULOCYTES: 1 %
Lymphocytes Relative: 25 %
Lymphs Abs: 1.9 10*3/uL (ref 0.7–4.0)
MCH: 31.1 pg (ref 26.0–34.0)
MCHC: 33.8 g/dL (ref 30.0–36.0)
MCV: 92.2 fL (ref 80.0–100.0)
Monocytes Absolute: 0.8 10*3/uL (ref 0.1–1.0)
Monocytes Relative: 10 %
NEUTROS ABS: 4.7 10*3/uL (ref 1.7–7.7)
NEUTROS PCT: 62 %
NRBC: 0 % (ref 0.0–0.2)
Platelets: 143 10*3/uL — ABNORMAL LOW (ref 150–400)
RBC: 4.98 MIL/uL (ref 4.22–5.81)
RDW: 13 % (ref 11.5–15.5)
WBC: 7.6 10*3/uL (ref 4.0–10.5)

## 2018-08-27 LAB — D-DIMER, QUANTITATIVE (NOT AT ARMC)

## 2018-08-27 NOTE — Discharge Instructions (Signed)
Please read instructions below.  The cardiology office will contact you to set up a follow up appointment and a heart monitor. Return to the ER for new or worsening symptoms; including worsening chest pain, shortness of breath, pain that radiates to the arm or neck, pain or shortness of breath worsened with exertion.

## 2018-08-27 NOTE — ED Provider Notes (Signed)
[facility]ERGENCY DEPARTMENT Provider Note   CSN: [id number] Arrival date & time: 08/27/18  4268     History   Chief Complaint Chief Complaint  Patient presents with  . Chest Pain    HPI Walter Brown is a 77 y.o. male w PMHx HTN, HLD, TIA, presenting to the ED with complaint of intermittent episodes of palpitations since Tuesday. Pt states symptoms come on at rest, described as a fluttering/throbbing in his chest. Assoc symptoms include lightheadedness and diaphoresis. He denies assoc CP, SOB, HA, vision changes, neck pain, or other neuro symptoms. He denies cardiac hx. Recently had cardiac workup at [facility] after an episode thought to be a TIA in May. He had a normal ECHO, normal holter monitor x2weeks, and a normal stress test in July. He states his heart rate got as high as 140. His apple watch provided heart rate monitoring from the past couple of days, showing fluctuating pulse rate, as high as 164.  The history is provided by the patient and medical records.    Past Medical History:  Diagnosis Date  . Anemia    mild  . Arthritis 04/06/2017  . Asthma    childhood  . Basal cell carcinoma    skin- on nose- basal cell (20 yrs ago) forehead 1 year ago  . BPH (benign prostatic hyperplasia)   . Cerumen impaction 08/10/2012  . Chicken pox as child  . Depression 08/01/2012  . [place] measles as a child  . Hyperlipidemia   . Hypertension   . Macular degeneration   . Medicare annual wellness visit, subsequent 11/07/2013  . Otitis externa 08/10/2012  . Preventative health care 08/01/2012  . Sleep apnea   . Stroke ([place])   . Thrombocytopenia [facility])     Patient Active Problem List   Diagnosis Date Noted  . Lipoma 08/04/2018  . Bradycardia 06/28/2018  . Cough 06/23/2018  . Irregular cardiac rhythm 03/10/2018  . TIA (transient ischemic attack) 03/10/2018  . Elevated liver function tests 12/08/2017  . Grade II hemorrhoids 09/16/2017  . Arthritis 04/06/2017  .  Macular degeneration 03/03/2015  . Right shoulder pain 09/26/2014  . Overweight 03/06/2014  . Medicare annual wellness visit, subsequent 11/07/2013  . Back pain with radiation 04/26/2013  . Cerumen impaction 08/10/2012  . Otitis externa 08/10/2012  . Preventative health care 08/01/2012  . Depression 08/01/2012  . Anemia   . Asthma   . Hyperlipidemia   . Hypertension   . BPH (benign prostatic hyperplasia)   . Cancer ([place])   . Thrombocytopenia ([place])     Past Surgical History:  Procedure Laterality Date  . [place]     very young, b/l  . EXCISIONAL HEMORRHOIDECTOMY    . HYDROCELE EXCISION / REPAIR  2012   b/l  . SKIN CANCER EXCISION     nose and forehead        Home Medications    Prior to Admission medications   Medication Sig Start Date End Date Taking? Authorizing Provider  clopidogrel (PLAVIX) 75 MG tablet Take 1 tablet (75 mg total) by mouth daily. 04/21/18   Garvin Fila, MD  hydrochlorothiazide (HYDRODIURIL) 25 MG tablet Take 1 tablet (25 mg total) by mouth daily. 06/23/18   [name], MD  Multiple Vitamins-Minerals (OCUVITE ADULT 50+ PO) Take 1 tablet by mouth daily.    [provider]  sertraline (ZOLOFT) 25 MG tablet Take 1 tablet (25 mg total) by mouth daily. 06/23/18   Penni Homans  A, MD  simvastatin (ZOCOR) 20 MG tablet Take 0.5 tablets (10 mg total) by mouth every evening. 06/23/18   [name], MD  terazosin (HYTRIN) 10 MG capsule Take 10 mg by mouth at bedtime.    [provider]    Family History Family History  Problem Relation Age of Onset  . Hypertension Mother   . Other Mother        MRSA  . Heart disease Mother        stents  . Parkinsonism Father   . Heart disease Father   . Hypertension Father   . Prostate cancer Father 75  . Prostate cancer Maternal Grandfather   . ADD / ADHD Son        ADHD  . Other Son 24       part of 1 lung removed- due to infection  . Stroke Maternal Grandmother   .  Cancer Paternal Grandfather        EYE    Social History Social History   Tobacco Use  . Smoking status: Former Smoker    Packs/day: 1.50    Years: 15.00    Pack years: 22.50    Start date: 10/07/1978  . Smokeless tobacco: Never Used  Substance Use Topics  . Alcohol use: Yes    Comment: 2 glasses of wine daily  . Drug use: No     Allergies   Albuterol sulfate; Azithromycin; and Doxycycline hyclate   Review of Systems Review of Systems  Constitutional: Positive for diaphoresis. Negative for fever.  Respiratory: Negative for cough and shortness of breath.   Cardiovascular: Positive for palpitations. Negative for chest pain and leg swelling.  Gastrointestinal: Negative for abdominal pain and nausea.  Musculoskeletal: Negative for neck pain.  Neurological: Positive for light-headedness. Negative for syncope, facial asymmetry and headaches.  All other systems reviewed and are negative.    Physical Exam Updated Vital Signs BP 133/84   Pulse 66   Temp 97.7 F (36.5 C) (Oral)   Resp 16   Ht 5\' 6"  (1.676 m)   Wt 79.8 kg   SpO2 99%   BMI 28.41 kg/m   Physical Exam  Constitutional: He is oriented to person, place, and time. He appears well-developed and well-nourished. He does not appear ill. No distress.  HENT:  Head: Normocephalic and atraumatic.  Eyes: Conjunctivae are normal.  Neck: Normal range of motion. Neck supple. No JVD present. No tracheal deviation present.  Cardiovascular: Normal rate, regular rhythm, normal heart sounds and intact distal pulses.  Pulmonary/Chest: Effort normal and breath sounds normal. No respiratory distress. He exhibits no tenderness.  Abdominal: Soft. Bowel sounds are normal. He exhibits no distension and no mass. There is no tenderness. There is no guarding.  Musculoskeletal: Normal range of motion. He exhibits no edema.  Neurological: He is alert and oriented to person, place, and time.  Mental Status:  Alert, oriented, thought  content appropriate, able to give a coherent history. Speech fluent without evidence of aphasia. Able to follow 2 step commands without difficulty.  Cranial Nerves:  II:  Peripheral visual fields grossly normal, pupils equal, round, reactive to light III,IV, VI: ptosis not present, extra-ocular motions intact bilaterally  V,VII: smile symmetric, facial light touch sensation equal VIII: hearing grossly normal to voice  X: uvula elevates symmetrically  XI: bilateral shoulder shrug symmetric and strong XII: midline tongue extension without fassiculations Motor:  Normal tone. 5/5 in upper and lower extremities bilaterally including strong and equal grip  strength and dorsiflexion/plantar flexion Sensory: Pinprick and light touch normal in all extremities.  Deep Tendon Reflexes: 2+ and symmetric in the biceps and patella Cerebellar: normal finger-to-nose with bilateral upper extremities No pronator drift CV: distal pulses palpable throughout    Skin: Skin is warm. He is not diaphoretic.  Psychiatric: He has a normal mood and affect. His behavior is normal.  Nursing note and vitals reviewed.    ED Treatments / Results  Labs (all labs ordered are listed, but only abnormal results are displayed) Labs Reviewed  CBC WITH DIFFERENTIAL/PLATELET - Abnormal; Notable for the following components:      Result Value   Platelets 143 (*)    All other components within normal limits  BASIC METABOLIC PANEL - Abnormal; Notable for the following components:   Creatinine, Ser 1.26 (*)    GFR calc non Af Amer 53 (*)    All other components within normal limits  TROPONIN I - Abnormal; Notable for the following components:   Troponin I 0.07 (*)    All other components within normal limits  TROPONIN I - Abnormal; Notable for the following components:   Troponin I 0.06 (*)    All other components within normal limits  D-DIMER, QUANTITATIVE (NOT AT [facility])    EKG EKG  Interpretation  Date/Time:  Thursday August 27 2018 09:44:20 EST Ventricular Rate:  104 PR Interval:    QRS Duration: 95 QT Interval:  348 QTC Calculation: 458 R Axis:   25 Text Interpretation:  Sinus tachycardia Probable left atrial enlargement tachycardia new from previous, otherwise similar Confirmed by Theotis Burrow [phone]) on 08/27/2018 10:25:49 AM   Radiology Dg Chest 2 View  Result Date: 08/27/2018 CLINICAL DATA:  Cardiac palpitations EXAM: CHEST - 2 VIEW COMPARISON:  Chest radiograph August 17, 2018 and chest CT September 03, 2018 FINDINGS: There is no edema or consolidation. The heart size and pulmonary vascularity are normal. No adenopathy. No pneumothorax. There is mild degenerative change in the thoracic spine. IMPRESSION: No edema or consolidation. Electronically Signed   By: Lowella Grip III M.D.   On: 08/27/2018 10:53    Procedures Procedures (including critical care time)  Medications Ordered in ED Medications - No data to display   Initial Impression / Assessment and Plan / ED Course  I have reviewed the triage vital signs and the nursing notes.  Pertinent labs & imaging results that were available during my care of the patient were reviewed by me and considered in my medical decision making (see chart for details).  Clinical Course as of Aug 28 1931  Thu Aug 27, 2018  1340 Spoke with Dr. Oval Linsey with cardiology. Plan to delta trop. Return call once delta trop. If unchanged, will need outpatient follow up and holter monitor.    [JR]  1505 Followed up with Dr. Oval Linsey after improving delta troponin. Captured some PVC's on cardiac monitor today, however did not capture any similar events to presentation. Cardiology office to contact patient with follow up appointment and to arrange for holter monitor. This was discussed with patient and he is agreeable to plan. Return precautions discussed.   [JR]    Clinical Course User Index [JR] Kym Scannell, [place] N,  PA-C   Patient presenting to the emergency department after multiple episodes of palpitations with associated lightheadedness and diaphoresis over the past couple of days.  Of note, this patient had normal cardiac stress test in July, normal echocardiogram and heart monitoring in May after suspected TIA event.  He  presents to the emergency department asymptomatic.  However, during episodes, he denies chest pain, shortness of breath, nausea, neck pain.  His Apple Watch documented intermittent fluctuating pulse rate, as high as 164 in the last few days.  No known history of atrial fibrillation.  On exam, he is mildly tachycardic though in sinus rhythm.  Heart and lung sounds are normal.  Normal neurologic exam.  Labs, EKG, chest x-ray obtained.  Labs without leukocytosis.  BMP is reassuring.  Initial troponin is slightly elevated at 0.07.  Patient was discussed with Dr. Rex Kras, who recommends d-dimer.  Suspicion for ACS at this time, given normal stress test in 2017.  D-dimer is negative.  Consult placed to cardiology for recommendations.  Patient discussed with Dr. Oval Linsey with cardiology.  Plan to attain repeat troponin.  If troponin is unchanged, patient will likely be safe for discharge with cardiology follow-up and Holter monitor.  Delta troponin is improved to 0.06.  Patient remains asymptomatic in the ED.  Intermittent PVCs noted on cardiac monitor with intermittent sinus tachycardia to 115 bpm, however otherwise unremarkable.  Followed up with Dr. Oval Linsey, who agrees with plan for discharge.  Cardiology office will contact patient to set up outpatient follow-up and Holter monitor.  Discussed this recommendation with the patient and he is agreeable to this plan.  Discussed strict return precautions.  He is well-appearing and safe for discharge.  Patient evaluated by Dr. Rex Kras.  Discussed results, findings, treatment and follow up. Patient advised of return precautions. Patient verbalized  understanding and agreed with plan.   Final Clinical Impressions(s) / ED Diagnoses   Final diagnoses:  Palpitations  Elevated troponin    ED Discharge Orders    None       Kaitlen Redford, [place] N, PA-C 08/27/18 1932    Little, Wenda Overland, MD 09/02/18 1504

## 2018-08-27 NOTE — ED Triage Notes (Signed)
Pt reports "episode of heart fluttering" this am, resolved, pt states he has had a few of these episodes over the last few months that resolved. Pt denies any chest pain, states when this fluttering is present he feels lightheaded, but not at this time. ekg performed, iv access obtained while pt being triaged.

## 2018-09-01 ENCOUNTER — Telehealth: Payer: Self-pay | Admitting: Cardiology

## 2018-09-01 NOTE — Telephone Encounter (Signed)
FINE WITH ME

## 2018-09-01 NOTE — Telephone Encounter (Signed)
° ° °  Patient is requesting to change providers. Patient wants to be follow by Dr Oval Linsey (from [place])  Please advise

## 2018-09-01 NOTE — Telephone Encounter (Signed)
° ° °  Patient requesting holter monitor as suggested from ED visit on 11/21. Please enter order   Thu Aug 27, 2018  1340 Spoke with Dr. Oval Linsey with cardiology. Plan to delta trop. Return call once delta trop. If unchanged, will need outpatient follow up and holter monitor.    [JR]  1505 Followed up with Dr. Oval Linsey after improving delta troponin. Captured some PVC's on cardiac monitor today, however did not capture any similar events to presentation. Cardiology office to contact patient with follow up appointment and to arrange for holter monitor.

## 2018-09-02 ENCOUNTER — Other Ambulatory Visit: Payer: Self-pay | Admitting: Family Medicine

## 2018-09-02 NOTE — Telephone Encounter (Signed)
Please set him up for a 48-hour Holter.

## 2018-09-02 NOTE — Telephone Encounter (Signed)
That is fine with me.

## 2018-09-07 ENCOUNTER — Encounter: Payer: Self-pay | Admitting: Cardiology

## 2018-09-07 ENCOUNTER — Ambulatory Visit (INDEPENDENT_AMBULATORY_CARE_PROVIDER_SITE_OTHER): Payer: Medicare HMO | Admitting: Cardiology

## 2018-09-07 VITALS — BP 124/62 | HR 60 | Ht 66.0 in | Wt 184.0 lb

## 2018-09-07 DIAGNOSIS — G459 Transient cerebral ischemic attack, unspecified: Secondary | ICD-10-CM

## 2018-09-07 DIAGNOSIS — R002 Palpitations: Secondary | ICD-10-CM | POA: Diagnosis not present

## 2018-09-07 DIAGNOSIS — I10 Essential (primary) hypertension: Secondary | ICD-10-CM | POA: Diagnosis not present

## 2018-09-07 DIAGNOSIS — I7090 Unspecified atherosclerosis: Secondary | ICD-10-CM | POA: Diagnosis not present

## 2018-09-07 HISTORY — DX: Unspecified atherosclerosis: I70.90

## 2018-09-07 HISTORY — DX: Palpitations: R00.2

## 2018-09-07 NOTE — Patient Instructions (Signed)
Medication Instructions:  Your physician recommends that you continue on your current medications as directed. Please refer to the Current Medication list given to you today.  If you need a refill on your cardiac medications before your next appointment, please call your pharmacy.   Lab work: Your physician recommends that you have the following labs drawn: BMP and troponin to be done today.  If you have labs (blood work) drawn today and your tests are completely normal, you will receive your results only by: Marland Kitchen MyChart Message (if you have MyChart) OR . A paper copy in the mail If you have any lab test that is abnormal or we need to change your treatment, we will call you to review the results.  Testing/Procedures: Your physician has recommended that you wear an event monitor. Event monitors are medical devices that record the heart's electrical activity. Doctors most often [place] these monitors to diagnose arrhythmias. Arrhythmias are problems with the speed or rhythm of the heartbeat. The monitor is a small, portable device. You can wear one while you do your normal daily activities. This is usually used to diagnose what is causing palpitations/syncope (passing out).  Follow-Up: At [facility], you and your health needs are our priority.  As part of our continuing mission to provide you with exceptional heart care, we have created designated Provider Care Teams.  These Care Teams include your primary Cardiologist (physician) and Advanced Practice Providers (APPs -  Physician Assistants and Nurse Practitioners) who all work together to provide you with the care you need, when you need it.  You will need a follow up appointment in 3 months.  Please call our office 2 months in advance to schedule this appointment.  You may see another member of our [employer] Provider Team in [place]: Jenne Campus, MD . Shirlee More, MD  Any Other Special Instructions Will Be Listed Below (If  Applicable).

## 2018-09-07 NOTE — Progress Notes (Signed)
Cardiology Office Note:    Date:  09/07/2018   ID:  Walter Brown, DOB 1940/10/26, MRN [id number]  PCP:  Walter Lukes, MD  Cardiologist:  Walter Lindau, MD   Referring MD: Walter Lukes, MD    ASSESSMENT:    1. Palpitations   2. TIA (transient ischemic attack)   3. Essential hypertension   4. ASVD (arteriosclerotic vascular disease)    PLAN:    In order of problems listed above:  1. Secondary prevention stressed with the patient.  Importance of compliance with diet and medication stressed and he vocalized understanding.  His blood pressure is stable.  His lipids are followed by his primary care physician. 2. Extensive review of emergency room records was done and his questions were answered to his satisfaction.  I will repeat a Chem-7 and a troponin level today as they were both mildly abnormal.  His TSH is fine. 3. In view of above symptoms he will undergo one month event monitoring.  This will help me assess his palpitations.  He has a smart watch and it has detected heart rates in the 130s and 140s.  Therefore if my evaluation does not yield any significant data I would consider him for a loop recorder.  I discussed this with the patient and he tells me that he will think about it and let me know. 4. Patient will be seen in follow-up appointment in 3 months or earlier if the patient has any concerns    Medication Adjustments/Labs and Tests Ordered: Current medicines are reviewed at length with the patient today.  Concerns regarding medicines are outlined above.  Orders Placed This Encounter  Procedures  . Basic metabolic panel  . Troponin I  . CARDIAC EVENT MONITOR   No orders of the defined types were placed in this encounter.    No chief complaint on file.    History of Present Illness:    Walter Brown is a 77 y.o. male.  Patient has calcification of the coronary arteries and aortic arch.  He is a very active gentleman and exercises appropriately and as  directed by the guidelines on a regular basis.  He denies any chest pain orthopnea or PND but he is bothered by palpitations which come on and off.  He denies any syncope or any dizziness.  He has excellent exercise capacity and effort tolerance based on his description of exercise.  He has gone to the emergency room for amputations and they released him after an evaluation.  His troponins were abnormal but subsequently after discharge he has been active exercising vigorously without any problems.  At the time of my evaluation he is alert awake oriented and in no distress  Past Medical History:  Diagnosis Date  . Anemia    mild  . Arthritis 04/06/2017  . Asthma    childhood  . Basal cell carcinoma    skin- on nose- basal cell (20 yrs ago) forehead 1 year ago  . BPH (benign prostatic hyperplasia)   . Cerumen impaction 08/10/2012  . Chicken pox as child  . Depression 08/01/2012  . [place] measles as a child  . Hyperlipidemia   . Hypertension   . Macular degeneration   . Medicare annual wellness visit, subsequent 11/07/2013  . Otitis externa 08/10/2012  . Preventative health care 08/01/2012  . Sleep apnea   . Stroke ([place])   . Thrombocytopenia ([place])     Past Surgical History:  Procedure Laterality Date  .  [place]     very young, b/l  . EXCISIONAL HEMORRHOIDECTOMY    . HYDROCELE EXCISION / REPAIR  2012   b/l  . SKIN CANCER EXCISION     nose and forehead    Current Medications: Current Meds  Medication Sig  . clopidogrel (PLAVIX) 75 MG tablet Take 1 tablet (75 mg total) by mouth daily.  . hydrochlorothiazide (HYDRODIURIL) 25 MG tablet Take 1 tablet (25 mg total) by mouth daily. (Patient taking differently: Take 12.5 mg by mouth daily. )  . Multiple Vitamins-Minerals (OCUVITE ADULT 50+ PO) Take 1 tablet by mouth daily.  . sertraline (ZOLOFT) 25 MG tablet Take 1 tablet (25 mg total) by mouth daily. (Patient taking differently: Take 12.5 mg by mouth daily. )  . simvastatin  (ZOCOR) 20 MG tablet Take 0.5 tablets (10 mg total) by mouth every evening.  . terazosin (HYTRIN) 10 MG capsule Take 10 mg by mouth at bedtime.     Allergies:   Albuterol sulfate; Azithromycin; and Doxycycline hyclate   Social History   Socioeconomic History  . Marital status: Married    Spouse name: Not on file  . Number of children: 2  . Years of education: Not on file  . Highest education level: Not on file  Occupational History  . Occupation: retired  Scientific laboratory technician  . Financial resource strain: Not on file  . Food insecurity:    Worry: Not on file    Inability: Not on file  . Transportation needs:    Medical: Not on file    Non-medical: Not on file  Tobacco Use  . Smoking status: Former Smoker    Packs/day: 1.50    Years: 15.00    Pack years: 22.50    Start date: 10/07/1978  . Smokeless tobacco: Never Used  Substance and Sexual Activity  . Alcohol use: Yes    Comment: 2 glasses of wine daily  . Drug use: No  . 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  Lifestyle  . Physical activity:    Days per week: Not on file    Minutes per session: Not on file  . Stress: Not on file  Relationships  . Social connections:    Talks on phone: Not on file    Gets together: Not on file    Attends religious service: Not on file    Active member of club or organization: Not on file    Attends meetings of clubs or organizations: Not on file    Relationship status: Not on file  Other Topics Concern  . Not on file  Social History Narrative  . Not on file     Family History: The patient's family history includes ADD / ADHD in his son; Cancer in his paternal grandfather; Heart disease in his father and mother; Hypertension in his father and mother; Other in his mother; Other (age of onset: 68) in his son; Parkinsonism in his father; Prostate cancer in his maternal grandfather; Prostate  cancer (age of onset: 37) in his father; Stroke in his maternal grandmother.  ROS:   Please see the history of present illness.    All other systems reviewed and are negative.  EKGs/Labs/Other Studies Reviewed:    The following studies were reviewed today: Emergency room records were discussed by me with the patient after review extensively questions were answered to his satisfaction.   Recent Labs: 06/23/2018: ALT  13; TSH 1.71 08/27/2018: BUN 22; Creatinine, Ser 1.26; Hemoglobin 15.5; Platelets 143; Potassium 3.5; Sodium 138  Recent Lipid Panel    Component Value Date/Time   CHOL 133 06/23/2018 1153   TRIG 82.0 06/23/2018 1153   HDL 58.60 06/23/2018 1153   CHOLHDL 2 06/23/2018 1153   VLDL 16.4 06/23/2018 1153   LDLCALC 58 06/23/2018 1153    Physical Exam:    VS:  BP 124/62 (BP Location: Right Arm, Patient Position: Sitting, Cuff Size: Normal)   Pulse 60   Ht 5\' 6"  (1.676 m)   Wt 184 lb (83.5 kg)   SpO2 98%   BMI 29.70 kg/m     Wt Readings from Last 3 Encounters:  09/07/18 184 lb (83.5 kg)  08/27/18 176 lb (79.8 kg)  08/04/18 181 lb 12.8 oz (82.5 kg)     GEN: Patient is in no acute distress HEENT: Normal NECK: No JVD; No carotid bruits LYMPHATICS: No lymphadenopathy CARDIAC: Hear sounds regular, 2/6 systolic murmur at the apex. RESPIRATORY:  Clear to auscultation without rales, wheezing or rhonchi  ABDOMEN: Soft, non-tender, non-distended MUSCULOSKELETAL:  No edema; No deformity  SKIN: Warm and dry NEUROLOGIC:  Alert and oriented x 3 PSYCHIATRIC:  Normal affect   Signed, Walter Lindau, MD  09/07/2018 8:50 AM    [place]

## 2018-09-08 ENCOUNTER — Ambulatory Visit: Payer: Medicare HMO

## 2018-09-08 ENCOUNTER — Telehealth: Payer: Self-pay | Admitting: *Deleted

## 2018-09-08 DIAGNOSIS — R002 Palpitations: Secondary | ICD-10-CM | POA: Diagnosis not present

## 2018-09-08 LAB — BASIC METABOLIC PANEL
BUN/Creatinine Ratio: 13 (ref 10–24)
BUN: 12 mg/dL (ref 8–27)
CALCIUM: 9.3 mg/dL (ref 8.6–10.2)
CO2: 25 mmol/L (ref 20–29)
CREATININE: 0.94 mg/dL (ref 0.76–1.27)
Chloride: 102 mmol/L (ref 96–106)
GFR calc Af Amer: 90 mL/min/{1.73_m2} (ref >59)
GFR calc non Af Amer: 78 mL/min/{1.73_m2} (ref >59)
GLUCOSE: 88 mg/dL (ref 65–99)
Potassium: 4 mmol/L (ref 3.5–5.2)
Sodium: 141 mmol/L (ref 134–144)

## 2018-09-08 LAB — TROPONIN I

## 2018-09-08 NOTE — Telephone Encounter (Signed)
Monitor results were reviewed patient will come in tomorrow for visit per Revankar's request. Patient understands to take it easy tonight and to go to the ED should anything out of the norm occur.

## 2018-09-08 NOTE — Telephone Encounter (Signed)
[facility] phoned today to let Dr. Gwyndolyn Kaufman that pt is exhibiting a-fib on their 14 day Zio report that they had placed this afternoon. Showing rates of 90-160 beats per minute. Rep from [facility] spoke with pt who stated he is doing yard work and is not experiencing any symptoms. They had a second incident that showed 186 HR for duration of 60 seconds.

## 2018-09-08 NOTE — Telephone Encounter (Signed)
Ledell Noss will be attempting to pull reports for Dr. Geraldo Pitter to review.

## 2018-09-09 ENCOUNTER — Ambulatory Visit (INDEPENDENT_AMBULATORY_CARE_PROVIDER_SITE_OTHER): Payer: Medicare HMO | Admitting: Cardiology

## 2018-09-09 ENCOUNTER — Encounter: Payer: Self-pay | Admitting: Cardiology

## 2018-09-09 VITALS — BP 124/62 | HR 62 | Ht 66.0 in | Wt 184.0 lb

## 2018-09-09 DIAGNOSIS — E782 Mixed hyperlipidemia: Secondary | ICD-10-CM | POA: Diagnosis not present

## 2018-09-09 DIAGNOSIS — I7090 Unspecified atherosclerosis: Secondary | ICD-10-CM

## 2018-09-09 DIAGNOSIS — I480 Paroxysmal atrial fibrillation: Secondary | ICD-10-CM | POA: Insufficient documentation

## 2018-09-09 DIAGNOSIS — I10 Essential (primary) hypertension: Secondary | ICD-10-CM | POA: Diagnosis not present

## 2018-09-09 DIAGNOSIS — G459 Transient cerebral ischemic attack, unspecified: Secondary | ICD-10-CM | POA: Diagnosis not present

## 2018-09-09 MED ORDER — APIXABAN 5 MG PO TABS: 5.0000 mg | ORAL_TABLET | Freq: Two times a day (BID) | ORAL | 0 refills | Status: DC

## 2018-09-09 MED ORDER — AMIODARONE HCL 200 MG PO TABS: 200.0000 mg | ORAL_TABLET | Freq: Every day | ORAL | 0 refills | Status: DC

## 2018-09-09 NOTE — Patient Instructions (Signed)
Medication Instructions:  Your physician has recommended you make the following change in your medication:   STOP plavix START amiodarone 200 mg daily START eliquis 5 mg twice daily  If you need a refill on your cardiac medications before your next appointment, please call your pharmacy.   Lab work: Your physician recommends that you have the following labs drawn: CBC, BMP, TSH, and liver panel to be done today.  Ifob to be done today.  If you have labs (blood work) drawn today and your tests are completely normal, you will receive your results only by: Marland Kitchen MyChart Message (if you have MyChart) OR . A paper copy in the mail If you have any lab test that is abnormal or we need to change your treatment, we will call you to review the results.  Testing/Procedures: None  Follow-Up: At [facility], you and your health needs are our priority.  As part of our continuing mission to provide you with exceptional heart care, we have created designated Provider Care Teams.  These Care Teams include your primary Cardiologist (physician) and Advanced Practice Providers (APPs -  Physician Assistants and Nurse Practitioners) who all work together to provide you with the care you need, when you need it.  You will need a follow up appointment in 4 weeks.  Please call our office 2 months in advance to schedule this appointment.  You may see another member of our [employer] Provider Team in [place]: Jenne Campus, MD . Shirlee More, MD  Any Other Special Instructions Will Be Listed Below (If Applicable).   Atrial Fibrillation Atrial fibrillation is a type of heartbeat that is irregular or fast (rapid). If you have this condition, your heart keeps quivering in a weird (chaotic) way. This condition can make it so your heart cannot pump blood normally. Having this condition gives a person more risk for stroke, heart failure, and other heart problems. There are different types of atrial  fibrillation. Talk with your doctor to learn about the type that you have. Follow these instructions at home:  Take over-the-counter and prescription medicines only as told by your doctor.  If your doctor prescribed a blood-thinning medicine, take it exactly as told. Taking too much of it can cause bleeding. If you do not take enough of it, you will not have the protection that you need against stroke and other problems.  Do not use any tobacco products. These include cigarettes, chewing tobacco, and e-cigarettes. If you need help quitting, ask your doctor.  If you have apnea (obstructive sleep apnea), manage it as told by your doctor.  Do not drink alcohol.  Do not drink beverages that have caffeine. These include coffee, soda, and tea.  Maintain a healthy weight. Do not use diet pills unless your doctor says they are safe for you. Diet pills may make heart problems worse.  Follow diet instructions as told by your doctor.  Exercise regularly as told by your doctor.  Keep all follow-up visits as told by your doctor. This is important. Contact a doctor if:  You notice a change in the speed, rhythm, or strength of your heartbeat.  You are taking a blood-thinning medicine and you notice more bruising.  You get tired more easily when you move or exercise. Get help right away if:  You have pain in your chest or your belly (abdomen).  You have sweating or weakness.  You feel sick to your stomach (nauseous).  You notice blood in your throw up (  vomit), poop (stool), or pee (urine).  You are short of breath.  You suddenly have swollen feet and ankles.  You feel dizzy.  Your suddenly get weak or numb in your face, arms, or legs, especially if it happens on one side of your body.  You have trouble talking, trouble understanding, or both.  Your face or your eyelid droops on one side. These symptoms may be an emergency. Do not wait to see if the symptoms will go away. Get medical  help right away. Call your local emergency services (911 in the [place] Do not drive yourself to the hospital. This information is not intended to replace advice given to you by your health care provider. Make sure you discuss any questions you have with your health care provider. Document Released: 07/02/2008 Document Revised: 02/29/2016 Document Reviewed: 01/18/2015 Elsevier Interactive Patient Education  2018 [employer]. Apixaban oral tablets What is this medicine? APIXABAN (a PIX a ban) is an anticoagulant (blood thinner). It is used to lower the chance of stroke in people with a medical condition called atrial fibrillation. It is also used to treat or prevent blood clots in the lungs or in the veins. This medicine may be used for other purposes; ask your health care provider or pharmacist if you have questions. COMMON BRAND NAME(S): Eliquis What should I tell my health care provider before I take this medicine? They need to know if you have any of these conditions: -bleeding disorders -bleeding in the brain -blood in your stools (black or tarry stools) or if you have blood in your vomit -history of stomach bleeding -kidney disease -liver disease -mechanical heart valve -an unusual or allergic reaction to apixaban, other medicines, foods, dyes, or preservatives -pregnant or trying to get pregnant -breast-feeding How should I use this medicine? Take this medicine by mouth with a glass of water. Follow the directions on the prescription label. You can take it with or without food. If it upsets your stomach, take it with food. Take your medicine at regular intervals. Do not take it more often than directed. Do not stop taking except on your doctor's advice. Stopping this medicine may increase your risk of a blot clot. Be sure to refill your prescription before you run out of medicine. Talk to your pediatrician regarding the use of this medicine in children. Special care may be  needed. Overdosage: If you think you have taken too much of this medicine contact a poison control center or emergency room at once. NOTE: This medicine is only for you. Do not share this medicine with others. What if I miss a dose? If you miss a dose, take it as soon as you can. If it is almost time for your next dose, take only that dose. Do not take double or extra doses. What may interact with this medicine? This medicine may interact with the following: -aspirin and aspirin-like medicines -certain medicines for fungal infections like ketoconazole and itraconazole -certain medicines for seizures like carbamazepine and phenytoin -certain medicines that treat or prevent blood clots like warfarin, enoxaparin, and dalteparin -clarithromycin -NSAIDs, medicines for pain and inflammation, like ibuprofen or naproxen -rifampin -ritonavir -St. John's wort This list may not describe all possible interactions. Give your health care provider a list of all the medicines, herbs, non-prescription drugs, or dietary supplements you use. Also tell them if you smoke, drink alcohol, or use illegal drugs. Some items may interact with your medicine. What should I watch for while using this medicine? Visit  your doctor or health care professional for regular checks on your progress. Notify your doctor or health care professional and seek emergency treatment if you develop breathing problems; changes in vision; chest pain; severe, sudden headache; pain, swelling, warmth in the leg; trouble speaking; sudden numbness or weakness of the face, arm or leg. These can be signs that your condition has gotten worse. If you are going to have surgery or other procedure, tell your doctor that you are taking this medicine. What side effects may I notice from receiving this medicine? Side effects that you should report to your doctor or health care professional as soon as possible: -allergic reactions like skin rash, itching or  hives, swelling of the face, lips, or tongue -signs and symptoms of bleeding such as bloody or black, tarry stools; red or dark-brown urine; spitting up blood or brown material that looks like coffee grounds; red spots on the skin; unusual bruising or bleeding from the eye, gums, or nose This list may not describe all possible side effects. Call your doctor for medical advice about side effects. You may report side effects to FDA at 1-800-FDA-1088. Where should I keep my medicine? Keep out of the reach of children. Store at room temperature between 20 and 25 degrees C (68 and 77 degrees F). Throw away any unused medicine after the expiration date. NOTE: This sheet is a summary. It may not cover all possible information. If you have questions about this medicine, talk to your doctor, pharmacist, or health care provider.  2018 Elsevier/Gold Standard (2016-04-15 11:54:23) Amiodarone tablets What is this medicine? AMIODARONE (a MEE oh da rone) is an antiarrhythmic drug. It helps make your heart beat regularly. Because of the side effects caused by this medicine, it is only used when other medicines have not worked. It is usually used for heartbeat problems that may be life threatening. This medicine may be used for other purposes; ask your health care provider or pharmacist if you have questions. COMMON BRAND NAME(S): Cordarone, Pacerone What should I tell my health care provider before I take this medicine? They need to know if you have any of these conditions: -liver disease -lung disease -other heart problems -thyroid disease -an unusual or allergic reaction to amiodarone, iodine, other medicines, foods, dyes, or preservatives -pregnant or trying to get pregnant -breast-feeding How should I use this medicine? Take this medicine by mouth with a glass of water. Follow the directions on the prescription label. You can take this medicine with or without food. However, you should always take it the  same way each time. Take your doses at regular intervals. Do not take your medicine more often than directed. Do not stop taking except on the advice of your doctor or health care professional. A special MedGuide will be given to you by the pharmacist with each prescription and refill. Be sure to read this information carefully each time. Talk to your pediatrician regarding the use of this medicine in children. Special care may be needed. Overdosage: If you think you have taken too much of this medicine contact a poison control center or emergency room at once. NOTE: This medicine is only for you. Do not share this medicine with others. What if I miss a dose? If you miss a dose, take it as soon as you can. If it is almost time for your next dose, take only that dose. Do not take double or extra doses. What may interact with this medicine? Do not take this medicine  with any of the following medications: -abarelix -apomorphine -arsenic trioxide -certain antibiotics like erythromycin, gemifloxacin, levofloxacin, pentamidine -certain medicines for depression like amoxapine, tricyclic antidepressants -certain medicines for fungal infections like fluconazole, itraconazole, ketoconazole, posaconazole, voriconazole -certain medicines for irregular heart beat like disopyramide, dofetilide, dronedarone, ibutilide, propafenone, sotalol -certain medicines for malaria like chloroquine, halofantrine -cisapride -droperidol -haloperidol -hawthorn -maprotiline -methadone -phenothiazines like chlorpromazine, mesoridazine, thioridazine -pimozide -ranolazine -red yeast rice -vardenafil -ziprasidone This medicine may also interact with the following medications: -antiviral medicines for HIV or AIDS -certain medicines for blood pressure, heart disease, irregular heart beat -certain medicines for cholesterol like atorvastatin, cerivastatin, lovastatin, simvastatin -certain medicines for hepatitis C like  sofosbuvir and ledipasvir; sofosbuvir -certain medicines for seizures like phenytoin -certain medicines for thyroid problems -certain medicines that treat or prevent blood clots like warfarin -cholestyramine -cimetidine -clopidogrel -cyclosporine -dextromethorphan -diuretics -fentanyl -general anesthetics -grapefruit juice -lidocaine -loratadine -methotrexate -other medicines that prolong the QT interval (cause an abnormal heart rhythm) -procainamide -quinidine -rifabutin, rifampin, or rifapentine -St. John's Wort -trazodone This list may not describe all possible interactions. Give your health care provider a list of all the medicines, herbs, non-prescription drugs, or dietary supplements you use. Also tell them if you smoke, drink alcohol, or use illegal drugs. Some items may interact with your medicine. What should I watch for while using this medicine? Your condition will be monitored closely when you first begin therapy. Often, this drug is first started in a hospital or other monitored health care setting. Once you are on maintenance therapy, visit your doctor or health care professional for regular checks on your progress. Because your condition and use of this medicine carry some risk, it is a good idea to carry an identification card, necklace or bracelet with details of your condition, medications, and doctor or health care professional. Dennis Bast may get drowsy or dizzy. Do not drive, use machinery, or do anything that needs mental alertness until you know how this medicine affects you. Do not stand or sit up quickly, especially if you are an older patient. This reduces the risk of dizzy or fainting spells. This medicine can make you more sensitive to the sun. Keep out of the sun. If you cannot avoid being in the sun, wear protective clothing and use sunscreen. Do not use sun lamps or tanning beds/booths. You should have regular eye exams before and during treatment. Call your doctor  if you have blurred vision, see halos, or your eyes become sensitive to light. Your eyes may get dry. It may be helpful to use a lubricating eye solution or artificial tears solution. If you are going to have surgery or a procedure that requires contrast dyes, tell your doctor or health care professional that you are taking this medicine. What side effects may I notice from receiving this medicine? Side effects that you should report to your doctor or health care professional as soon as possible: -allergic reactions like skin rash, itching or hives, swelling of the face, lips, or tongue -blue-gray coloring of the skin -blurred vision, seeing blue green halos, increased sensitivity of the eyes to light -breathing problems -chest pain -dark urine -fast, irregular heartbeat -feeling faint or light-headed -intolerance to heat or cold -nausea or vomiting -pain and swelling of the scrotum -pain, tingling, numbness in feet, hands -redness, blistering, peeling or loosening of the skin, including inside the mouth -spitting up blood -stomach pain -sweating -unusual or uncontrolled movements of body -unusually weak or tired -weight gain or loss -yellowing of the  eyes or skin Side effects that usually do not require medical attention (report to your doctor or health care professional if they continue or are bothersome): -change in sex drive or performance -constipation -dizziness -headache -loss of appetite -trouble sleeping This list may not describe all possible side effects. Call your doctor for medical advice about side effects. You may report side effects to FDA at 1-800-FDA-1088. Where should I keep my medicine? Keep out of the reach of children. Store at room temperature between 20 and 25 degrees C (68 and 77 degrees F). Protect from light. Keep container tightly closed. Throw away any unused medicine after the expiration date. NOTE: This sheet is a summary. It may not cover all  possible information. If you have questions about this medicine, talk to your doctor, pharmacist, or health care provider.  2018 Elsevier/Gold Standard (2013-12-27 19:48:11)

## 2018-09-09 NOTE — Progress Notes (Signed)
Cardiology Office Note:    Date:  09/09/2018   ID:  Walter Brown, DOB 1941/08/11, MRN [id number]  PCP:  [name], MD  Cardiologist:  Jenean Lindau, MD   Referring MD: [name], MD    ASSESSMENT:    1. PAF (paroxysmal atrial fibrillation) ([place])   2. Mixed hyperlipidemia   3. Essential hypertension   4. TIA (transient ischemic attack)   5. ASVD (arteriosclerotic vascular disease)    PLAN:    In order of problems listed above:  1. Secondary prevention stressed with the patient.  Importance of compliance with diet and medication stressed and he vocalized understanding.  His blood pressure is stable.I discussed with the patient atrial fibrillation, disease process. Management and therapy including rate and rhythm control, anticoagulation benefits and potential risks were discussed extensively with the patient. Patient had multiple questions which were answered to patient's satisfaction. 2. Patient will have a follow-up testing in view of the fact.  He will have blood work also.  I have asked the patient to stop taking clopidogrel.  We will initiate Eliquis 5 mg twice daily. 3. Patient has had a CT scan and pulmonary function test recently.  I have asked him to get in touch with his to advise him about these changes and the initiation of amiodarone therapy and he agrees to do so. 4. He will be seen in follow-up appointment in a month or earlier if he has any concerns.   Medication Adjustments/Labs and Tests Ordered: Current medicines are reviewed at length with the patient today.  Concerns regarding medicines are outlined above.  No orders of the defined types were placed in this encounter.  No orders of the defined types were placed in this encounter.    No chief complaint on file.    History of Present Illness:    Walter Brown is a 77 y.o. male.  The patient has atherosclerotic vascular disease, essential hypertension and dyslipidemia.  He has had history of  TIA.  He complained of palpitations therefore we will put ZIO AT patch.  Patient revealed episodes of atrial fibrillation with rapid ventricular rate therefore he was called here for an evaluation.  At the time of my evaluation is alert awake oriented and in no distress.  Past Medical History:  Diagnosis Date  . Anemia    mild  . Arthritis 04/06/2017  . Asthma    childhood  . Basal cell carcinoma    skin- on nose- basal cell (20 yrs ago) forehead 1 year ago  . BPH (benign prostatic hyperplasia)   . Cerumen impaction 08/10/2012  . Chicken pox as child  . Depression 08/01/2012  . [place] measles as a child  . Hyperlipidemia   . Hypertension   . Macular degeneration   . Medicare annual wellness visit, subsequent 11/07/2013  . Otitis externa 08/10/2012  . Preventative health care 08/01/2012  . Sleep apnea   . Stroke ([place])   . Thrombocytopenia ([place])     Past Surgical History:  Procedure Laterality Date  . [place]     very young, b/l  . EXCISIONAL HEMORRHOIDECTOMY    . HYDROCELE EXCISION / REPAIR  2012   b/l  . SKIN CANCER EXCISION     nose and forehead    Current Medications: Current Meds  Medication Sig  . clopidogrel (PLAVIX) 75 MG tablet Take 1 tablet (75 mg total) by mouth daily.  . hydrochlorothiazide (HYDRODIURIL) 25 MG tablet Take 1 tablet (25  mg total) by mouth daily. (Patient taking differently: Take 12.5 mg by mouth daily. )  . Multiple Vitamins-Minerals (OCUVITE ADULT 50+ PO) Take 1 tablet by mouth daily.  . sertraline (ZOLOFT) 25 MG tablet Take 1 tablet (25 mg total) by mouth daily. (Patient taking differently: Take 12.5 mg by mouth daily. )  . simvastatin (ZOCOR) 20 MG tablet Take 0.5 tablets (10 mg total) by mouth every evening.  . terazosin (HYTRIN) 10 MG capsule Take 10 mg by mouth at bedtime.     Allergies:   Albuterol sulfate; Azithromycin; and Doxycycline hyclate   Social History   Socioeconomic History  . Marital status: Married    Spouse  name: Not on file  . Number of children: 2  . Years of education: Not on file  . Highest education level: Not on file  Occupational History  . Occupation: retired  Scientific laboratory technician  . Financial resource strain: Not on file  . Food insecurity:    Worry: Not on file    Inability: Not on file  . Transportation needs:    Medical: Not on file    Non-medical: Not on file  Tobacco Use  . Smoking status: Former Smoker    Packs/day: 1.50    Years: 15.00    Pack years: 22.50    Start date: 10/07/1978  . Smokeless tobacco: Never Used  Substance and Sexual Activity  . Alcohol use: Yes    Comment: 2 glasses of wine daily  . Drug use: No  . 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  Lifestyle  . Physical activity:    Days per week: Not on file    Minutes per session: Not on file  . Stress: Not on file  Relationships  . Social connections:    Talks on phone: Not on file    Gets together: Not on file    Attends religious service: Not on file    Active member of club or organization: Not on file    Attends meetings of clubs or organizations: Not on file    Relationship status: Not on file  Other Topics Concern  . Not on file  Social History Narrative  . Not on file     Family History: The patient's family history includes ADD / ADHD in his son; Cancer in his paternal grandfather; Heart disease in his father and mother; Hypertension in his father and mother; Other in his mother; Other (age of onset: 3) in his son; Parkinsonism in his father; Prostate cancer in his maternal grandfather; Prostate cancer (age of onset: 31) in his father; Stroke in his maternal grandmother.  ROS:   Please see the history of present illness.    All other systems reviewed and are negative.  EKGs/Labs/Other Studies Reviewed:    The following studies were reviewed today: I discussed my findings with the patient at  extensive length.  Currently he is in sinus rhythm clinically.   Recent Labs: 06/23/2018: ALT 13; TSH 1.71 08/27/2018: Hemoglobin 15.5; Platelets 143 09/07/2018: BUN 12; Creatinine, Ser 0.94; Potassium 4.0; Sodium 141  Recent Lipid Panel    Component Value Date/Time   CHOL 133 06/23/2018 1153   TRIG 82.0 06/23/2018 1153   HDL 58.60 06/23/2018 1153   CHOLHDL 2 06/23/2018 1153   VLDL 16.4 06/23/2018 1153   LDLCALC 58 06/23/2018 1153    Physical Exam:    VS:  BP 124/62 (  BP Location: Right Arm, Patient Position: Sitting, Cuff Size: Normal)   Pulse 62   Ht 5\' 6"  (1.676 m)   Wt 184 lb (83.5 kg)   SpO2 97%   BMI 29.70 kg/m     Wt Readings from Last 3 Encounters:  09/09/18 184 lb (83.5 kg)  09/07/18 184 lb (83.5 kg)  08/27/18 176 lb (79.8 kg)     GEN: Patient is in no acute distress HEENT: Normal NECK: No JVD; No carotid bruits LYMPHATICS: No lymphadenopathy CARDIAC: Hear sounds regular, 2/6 systolic murmur at the apex. RESPIRATORY:  Clear to auscultation without rales, wheezing or rhonchi  ABDOMEN: Soft, non-tender, non-distended MUSCULOSKELETAL:  No edema; No deformity  SKIN: Warm and dry NEUROLOGIC:  Alert and oriented x 3 PSYCHIATRIC:  Normal affect   Signed, Jenean Lindau, MD  09/09/2018 9:11 AM    [facility]

## 2018-09-09 NOTE — Telephone Encounter (Signed)
Spoke with patient and he is currently wearing a monitor. Was seen by Dr Geraldo Pitter today

## 2018-09-10 ENCOUNTER — Other Ambulatory Visit: Payer: Self-pay

## 2018-09-10 DIAGNOSIS — I480 Paroxysmal atrial fibrillation: Secondary | ICD-10-CM | POA: Diagnosis not present

## 2018-09-10 DIAGNOSIS — L570 Actinic keratosis: Secondary | ICD-10-CM | POA: Diagnosis not present

## 2018-09-10 DIAGNOSIS — E782 Mixed hyperlipidemia: Secondary | ICD-10-CM

## 2018-09-10 LAB — CBC WITH DIFFERENTIAL/PLATELET
Basophils Absolute: 0.1 10*3/uL (ref 0.0–0.2)
Basos: 1 %
EOS (ABSOLUTE): 0.1 10*3/uL (ref 0.0–0.4)
Eos: 2 %
HEMOGLOBIN: 13.8 g/dL (ref 13.0–17.7)
Hematocrit: 39.1 % (ref 37.5–51.0)
Immature Grans (Abs): 0.1 10*3/uL (ref 0.0–0.1)
Immature Granulocytes: 1 %
LYMPHS ABS: 1.5 10*3/uL (ref 0.7–3.1)
Lymphs: 23 %
MCH: 31.5 pg (ref 26.6–33.0)
MCHC: 35.3 g/dL (ref 31.5–35.7)
MCV: 89 fL (ref 79–97)
MONOCYTES: 11 %
MONOS ABS: 0.7 10*3/uL (ref 0.1–0.9)
NEUTROS ABS: 4.1 10*3/uL (ref 1.4–7.0)
Neutrophils: 62 %
Platelets: 137 10*3/uL — ABNORMAL LOW (ref 150–450)
RBC: 4.38 x10E6/uL (ref 4.14–5.80)
RDW: 13.1 % (ref 12.3–15.4)
WBC: 6.5 10*3/uL (ref 3.4–10.8)

## 2018-09-10 LAB — BASIC METABOLIC PANEL
BUN / CREAT RATIO: 11 (ref 10–24)
BUN: 12 mg/dL (ref 8–27)
CHLORIDE: 104 mmol/L (ref 96–106)
CO2: 24 mmol/L (ref 20–29)
Calcium: 9.1 mg/dL (ref 8.6–10.2)
Creatinine, Ser: 1.08 mg/dL (ref 0.76–1.27)
GFR calc non Af Amer: 66 mL/min/{1.73_m2} (ref >59)
GFR, EST AFRICAN AMERICAN: 76 mL/min/{1.73_m2} (ref >59)
GLUCOSE: 79 mg/dL (ref 65–99)
POTASSIUM: 4 mmol/L (ref 3.5–5.2)
SODIUM: 142 mmol/L (ref 134–144)

## 2018-09-10 LAB — HEPATIC FUNCTION PANEL
ALT: 11 IU/L (ref 0–44)
AST: 17 IU/L (ref 0–40)
Albumin: 4.2 g/dL (ref 3.5–4.8)
Alkaline Phosphatase: 66 IU/L (ref 39–117)
BILIRUBIN, DIRECT: 0.2 mg/dL (ref 0.00–0.40)
Bilirubin Total: 0.6 mg/dL (ref 0.0–1.2)
TOTAL PROTEIN: 6.1 g/dL (ref 6.0–8.5)

## 2018-09-10 LAB — TSH: TSH: 1.43 u[IU]/mL (ref 0.450–4.500)

## 2018-09-10 MED ORDER — ROSUVASTATIN CALCIUM 20 MG PO TABS: 10.0000 mg | ORAL_TABLET | Freq: Every day | ORAL | 1 refills | Status: DC

## 2018-09-17 LAB — FECAL OCCULT BLOOD, IMMUNOCHEMICAL: FECAL OCCULT BLD: NEGATIVE

## 2018-09-22 DIAGNOSIS — H353131 Nonexudative age-related macular degeneration, bilateral, early dry stage: Secondary | ICD-10-CM | POA: Diagnosis not present

## 2018-09-22 DIAGNOSIS — H43812 Vitreous degeneration, left eye: Secondary | ICD-10-CM | POA: Diagnosis not present

## 2018-09-23 ENCOUNTER — Ambulatory Visit (HOSPITAL_BASED_OUTPATIENT_CLINIC_OR_DEPARTMENT_OTHER)
Admission: RE | Admit: 2018-09-23 | Discharge: 2018-09-23 | Disposition: A | Discharge status: Home / Self Care | Payer: Medicare HMO | Source: Ambulatory Visit | Attending: Pulmonary Disease | Admitting: Pulmonary Disease

## 2018-09-23 DIAGNOSIS — R911 Solitary pulmonary nodule: Secondary | ICD-10-CM

## 2018-09-23 DIAGNOSIS — R918 Other nonspecific abnormal finding of lung field: Secondary | ICD-10-CM | POA: Diagnosis not present

## 2018-09-24 NOTE — Telephone Encounter (Signed)
Patient was seen by Dr Julianne Rice 12/2 and 12/4

## 2018-09-28 NOTE — Progress Notes (Addendum)
Subjective:   Walter Brown is a 77 y.o. male who presents for Medicare Annual/Subsequent preventive examination.  Review of Systems: No ROS.  Medicare Wellness Visit. Additional risk factors are reflected in the social history. Cardiac Risk Factors include: advanced age (>77men, >88 women);dyslipidemia;male gender;hypertension Sleep patterns: Sleeps 6-7 hrs nightly. Naps daily. Home Safety/Smoke Alarms: Feels safe in home. Smoke alarms in place. Lives with wife. One story home. Walk-in shower with grab rails.  Male:   CCS-  Last per pt 10/07/09. No polyps. [place]. PSA- No results found for: PSA  Eyes- Dr.McCuen annually. Pt states he has macular degeneration and cataracts that are being watched.     Objective:    Vitals: BP 136/84 (BP Location: Left Arm, Patient Position: Sitting, Cuff Size: Normal)   Pulse (!) 54   Ht 5\' 6"  (1.676 m)   Wt 184 lb 12.8 oz (83.8 kg)   SpO2 96%   BMI 29.83 kg/m   Body mass index is 29.83 kg/m.  Advanced Directives 10/05/2018 08/27/2018 10/03/2017 03/05/2016 03/03/2015  Does Patient Have a Medical Advance Directive? Yes No Yes Yes Yes  Type of Advance Directive Living will;Healthcare Power of [place];Living will [place];Living will [place];Living will  Does patient want to make changes to medical advance directive? - - - No - Patient declined Yes - information given  Copy of [place] in Chart? No - copy requested - No - copy requested Yes No - copy requested  Would patient like information on creating a medical advance directive? - No - Patient declined - - -    Tobacco Social History   Tobacco Use  Smoking Status Former Smoker  . Packs/day: 1.50  . Years: 15.00  . Pack years: 22.50  . Start date: 10/07/1978  Smokeless Tobacco Never Used     Counseling given: Not Answered   Clinical Intake: Pain : No/denies pain    Past Medical History:    Diagnosis Date  . Anemia    mild  . Arthritis 04/06/2017  . Asthma    childhood  . Basal cell carcinoma    skin- on nose- basal cell (20 yrs ago) forehead 1 year ago  . BPH (benign prostatic hyperplasia)   . Cerumen impaction 08/10/2012  . Chicken pox as child  . Depression 08/01/2012  . [place] measles as a child  . Hyperlipidemia   . Hypertension   . Macular degeneration   . Medicare annual wellness visit, subsequent 11/07/2013  . Otitis externa 08/10/2012  . PAF (paroxysmal atrial fibrillation) ([place])   . Preventative health care 08/01/2012  . Sleep apnea   . Stroke ([place])   . Thrombocytopenia ([place])    Past Surgical History:  Procedure Laterality Date  . [place]     very young, b/l  . EXCISIONAL HEMORRHOIDECTOMY    . HYDROCELE EXCISION / REPAIR  2012   b/l  . SKIN CANCER EXCISION     nose and forehead   Family History  Problem Relation Age of Onset  . Hypertension Mother   . Other Mother        MRSA  . Heart disease Mother        stents  . Parkinsonism Father   . Heart disease Father   . Hypertension Father   . Prostate cancer Father 86  . Prostate cancer Maternal Grandfather   . ADD / ADHD Son  ADHD  . Other Son 24       part of 1 lung removed- due to infection  . Stroke Maternal Grandmother   . Cancer Paternal Grandfather        EYE   Social History   Socioeconomic History  . Marital status: Married    Spouse name: Not on file  . Number of children: 2  . Years of education: Not on file  . Highest education level: Not on file  Occupational History  . Occupation: retired  Scientific laboratory technician  . Financial resource strain: Not on file  . Food insecurity:    Worry: Not on file    Inability: Not on file  . Transportation needs:    Medical: Not on file    Non-medical: Not on file  Tobacco Use  . Smoking status: Former Smoker    Packs/day: 1.50    Years: 15.00    Pack years: 22.50    Start date: 10/07/1978  . Smokeless tobacco: Never Used   Substance and Sexual Activity  . Alcohol use: Yes    Comment: 2 glasses of wine daily  . Drug use: No  . 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  Lifestyle  . Physical activity:    Days per week: Not on file    Minutes per session: Not on file  . Stress: Not on file  Relationships  . Social connections:    Talks on phone: Not on file    Gets together: Not on file    Attends religious service: Not on file    Active member of club or organization: Not on file    Attends meetings of clubs or organizations: Not on file    Relationship status: Not on file  Other Topics Concern  . Not on file  Social History Narrative  . Not on file    Outpatient Encounter Medications as of 10/05/2018  Medication Sig  . amiodarone (PACERONE) 200 MG tablet Take 1 tablet (200 mg total) by mouth daily.  Marland Kitchen apixaban (ELIQUIS) 5 MG TABS tablet Take 1 tablet (5 mg total) by mouth 2 (two) times daily.  . hydrochlorothiazide (HYDRODIURIL) 25 MG tablet Take 1 tablet (25 mg total) by mouth daily. (Patient taking differently: Take 12.5 mg by mouth daily. )  . Multiple Vitamins-Minerals (OCUVITE ADULT 50+ PO) Take 1 tablet by mouth daily.  . rosuvastatin (CRESTOR) 20 MG tablet Take 0.5 tablets (10 mg total) by mouth daily.  . sertraline (ZOLOFT) 25 MG tablet Take 1 tablet (25 mg total) by mouth daily. (Patient taking differently: Take 12.5 mg by mouth daily. )  . terazosin (HYTRIN) 10 MG capsule Take 10 mg by mouth at bedtime.   No facility-administered encounter medications on file as of 10/05/2018.     Activities of Daily Living In your present state of health, do you have any difficulty performing the following activities: 10/05/2018  Hearing? Y  Comment wearing hearing aids.  Vision? N  Difficulty concentrating or making decisions? N  Comment Does crossword and suduko daily  Walking or climbing stairs? N   Dressing or bathing? N  Doing errands, shopping? N  Preparing Food and eating ? N  Using the Toilet? N  In the past six months, have you accidently leaked urine? N  Do you have problems with loss of bowel control? N  Managing your Medications? N  Managing your Finances? N  Housekeeping or managing your Housekeeping? N  Some recent data might be hidden    Patient Care Team: [name], MD as PCP - General (Family Medicine) [place], Jennefer Bravo, MD as Referring Physician (Dermatology) Luberta Mutter, MD as Consulting Physician (Ophthalmology) Ardis Hughs, MD as Consulting Physician (Urology)   Assessment:   This is a routine wellness examination for [place]. Physical assessment deferred to PCP.   Exercise Activities and Dietary recommendations Current Exercise Habits: Structured exercise class, Type of exercise: strength training/weights;calisthenics(elliptical), Time (Minutes): 60, Frequency (Times/Week): 3, Weekly Exercise (Minutes/Week): 180, Intensity: Mild, Exercise limited by: None identified   Diet (meal preparation, eat out, water intake, caffeinated beverages, dairy products, fruits and vegetables):  Breakfast: raisin bran or oatmeal. Coffee x2 Lunch: pimento cheese sandwich or deli meat and diet soda and a few chips Dinner:  Greens, protein, wine  Goals    . Come off of Zoloft and Statins    . Drink 6-8 glasses of water per day.    . Weight (lb) < 170 lb (77.1 kg) (pt-stated)     By continuing to exercise and eating a low carb diet.       Fall Risk Fall Risk  10/05/2018 04/06/2018 10/03/2017 03/05/2016 03/03/2015  Falls in the past year? 0 No No No No    Depression Screen PHQ 2/9 Scores 10/05/2018 04/06/2018 10/03/2017 03/05/2016  PHQ - 2 Score 0 0 0 1    Cognitive Function Ad8 score reviewed for issues:  Issues making decisions:no  Less interest in hobbies / activities:no  Repeats questions, stories (family complaining):no  Trouble using  ordinary gadgets (microwave, computer, phone):no  Forgets the month or year: no  Mismanaging finances: no  Remembering appts:no  Daily problems with thinking and/or memory:no Ad8 score is=0    MMSE - Mini Mental State Exam 03/05/2016  Orientation to time 5  Orientation to Place 5  Registration 3  Attention/ Calculation 5  Recall 3  Language- name 2 objects 2  Language- repeat 1  Language- follow 3 step command 3  Language- read & follow direction 1  Write a sentence 1  Copy design 1  Total score 30        Immunization History  Administered Date(s) Administered  . Influenza Split 07/02/2012  . Influenza Whole 06/07/2013  . Influenza,inj,Quad PF,6+ Mos 06/21/2014  . Influenza-Unspecified 06/07/2016, 07/08/2017, 05/26/2018  . Pneumococcal Conjugate-13 11/04/2013  . Pneumococcal Polysaccharide-23 10/08/2007  . Tdap 11/04/2013  . Zoster 10/07/1998  . Zoster Recombinat (Shingrix) 05/26/2018   Screening Tests Health Maintenance  Topic Date Due  . TETANUS/TDAP  11/05/2023  . INFLUENZA VACCINE  Completed  . PNA vac Low Risk Adult  Completed       Plan:    Please schedule your next medicare wellness visit with me in 1 yr.  Continue to eat heart healthy diet (full of fruits, vegetables, whole grains, lean protein, water--limit salt, fat, and sugar intake) and increase physical activity as tolerated.  Continue doing brain stimulating activities (puzzles, reading, staying active) to keep memory sharp.   Bring a copy of your living will and/or healthcare power of attorney to your next office visit.  Please check with your insurance about Cologuard coverage.  Please look into carbon monoxide detector for your home safety.   I have personally reviewed and noted the following in the patient's chart:   . Medical and social history . Use of alcohol, tobacco or illicit drugs  . Current medications and supplements . Functional  ability and status . Nutritional  status . Physical activity . Advanced directives . List of other physicians . Hospitalizations, surgeries, and ER visits in previous 12 months . Vitals . Screenings to include cognitive, depression, and falls . Referrals and appointments  In addition, I have reviewed and discussed with patient certain preventive protocols, quality metrics, and best practice recommendations. A written personalized care plan for preventive services as well as general preventive health recommendations were provided to patient.     Shela Nevin, [place]  10/05/2018  Medical screening examination/treatment was performed by qualified clinical staff member and as supervising physician I was immediately available for consultation/collaboration. I have reviewed documentation and agree with assessment and plan.  Penni Homans, MD

## 2018-10-02 NOTE — Telephone Encounter (Signed)
[place] can you please advise if these Ct was covered or if their is something that Dr.Olalere can do in this case thank you.

## 2018-10-02 NOTE — Telephone Encounter (Signed)
I have responed to the patient nothing further needed at this time.

## 2018-10-05 ENCOUNTER — Ambulatory Visit: Payer: Medicare HMO | Admitting: Pulmonary Disease

## 2018-10-05 ENCOUNTER — Encounter: Payer: Self-pay | Admitting: Pulmonary Disease

## 2018-10-05 ENCOUNTER — Ambulatory Visit (INDEPENDENT_AMBULATORY_CARE_PROVIDER_SITE_OTHER): Payer: Medicare HMO | Admitting: *Deleted

## 2018-10-05 ENCOUNTER — Encounter: Payer: Self-pay | Admitting: *Deleted

## 2018-10-05 VITALS — BP 136/84 | HR 54 | Ht 66.0 in | Wt 184.8 lb

## 2018-10-05 VITALS — BP 118/80 | HR 59 | Ht 64.05 in | Wt 184.6 lb

## 2018-10-05 DIAGNOSIS — Z0000 Encounter for general adult medical examination without abnormal findings: Secondary | ICD-10-CM | POA: Diagnosis not present

## 2018-10-05 DIAGNOSIS — R9389 Abnormal findings on diagnostic imaging of other specified body structures: Secondary | ICD-10-CM | POA: Diagnosis not present

## 2018-10-05 DIAGNOSIS — R911 Solitary pulmonary nodule: Secondary | ICD-10-CM

## 2018-10-05 NOTE — Patient Instructions (Signed)
CT as we reviewed PFT as we reviewed together  We will repeat your CT in about a year We will see you back following the repeat CT  Call with any significant concerns

## 2018-10-05 NOTE — Progress Notes (Signed)
Subjective:    Patient ID: Walter Brown, male    DOB: June 28, 1941, 77 y.o.   MRN: [id number] Chief complaint- Abnormal CT scan of the chest showing multiple lung nodules Symptoms of cough and shortness of breath of improved  HPI:  Cough is better, not feeling acutely ill Brings up clear secretions Not feeling acutely ill Symptoms started about a year ago when he was exposed to somebody who had what appeared to be an upper respiratory infection No chest pains or chest discomfort, no weight loss, no hemoptysis Past history of basal cell carcinoma on the nasal bridge Reformed smoker-quit many years ago over 30 years ago  He had a CT scan of the chest showing what appears to be mucus in the airway and atelectasis at the base of the right lung, small nodules in the lung Small lung nodules are stable on CT  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  HENT: Negative for congestion, dental problem, ear pain, nosebleeds, postnasal drip, rhinorrhea, sinus pressure, sneezing, sore throat and trouble swallowing.   Eyes: Negative for redness and itching.  Respiratory: Negative for cough and shortness of breath.   Skin: Negative for rash.  Allergic/Immunologic: Negative.    Past Medical History:  Diagnosis Date  . Anemia    mild  . Arthritis 04/06/2017  . Asthma    childhood  . Basal cell carcinoma    skin- on nose- basal cell (20 yrs ago) forehead 1 year ago  . BPH (benign prostatic hyperplasia)   . Cerumen impaction 08/10/2012  . Chicken pox as child  . Depression 08/01/2012  . [place] measles as a child  . Hyperlipidemia   . Hypertension   . Macular degeneration   . Medicare annual wellness visit, subsequent 11/07/2013  . Otitis externa 08/10/2012  . PAF (paroxysmal atrial fibrillation)  ([place])   . Preventative health care 08/01/2012  . Sleep apnea   . Stroke ([place])   . Thrombocytopenia ([place])    Past Surgical History:  Procedure Laterality Date  . [place]     very young, b/l  . EXCISIONAL HEMORRHOIDECTOMY    . HYDROCELE EXCISION / REPAIR  2012   b/l  . SKIN CANCER EXCISION     nose and forehead   .  No significant contributory family history  Reformed smoker No significant history of alcohol use     Objective:   Physical Exam Constitutional:      General: He is not in acute distress.    Appearance: He is well-developed. He is not diaphoretic.  HENT:     Head: Normocephalic and atraumatic.     Mouth/Throat:     Pharynx: No oropharyngeal exudate.  Eyes:     General:        Right eye: No discharge.        Left eye: No discharge.     Pupils: Pupils are equal, round, and reactive to light.  Neck:     Musculoskeletal: Normal range of motion and neck supple.     Thyroid: No thyromegaly.     Trachea: No tracheal deviation.  Cardiovascular:     Rate and Rhythm: Normal rate and regular rhythm.     Heart sounds: No murmur. No friction rub.  Pulmonary:     Effort: Pulmonary effort is normal.  Breath sounds: Normal breath sounds.  Neurological:     Mental Status: He is alert.    CT scan was reviewed by myself with the patient Compared with recent abdominal CT performed in February 2019  CT scan of the chest reviewed with the patient-lung nodules are stable PFT was reviewed with the patient as well-increased residual volumes     Assessment & Plan:.  .  Chronic cough -Cough is better -Has not felt acutely ill  .  Abnormal CT scan of the chest showing lung nodules -Stable CT findings of lung nodules, atelectasis is improved  .  Endobronchial lesion likely secretion -No endobronchial lesion on CT  .  Atelectasis right base -Finding more consistent with atelectasis -Atelectasis is improved   .  Past history of asthma -Has no  symptoms suggestive of an exacerbation   .  Recent atrial fibrillation -On anticoagulation  Plan:  Repeat CT scan of the chest in 1year Further evaluation regarding lung nodules discussed  Encouraged to call if any significant changes in symptoms  Encouraged to stay active

## 2018-10-05 NOTE — Addendum Note (Signed)
Addended by: Vivia Ewing on: 10/05/2018 04:49 PM   Modules accepted: Orders

## 2018-10-05 NOTE — Patient Instructions (Signed)
Please schedule your next medicare wellness visit with me in 1 yr.  Continue to eat heart healthy diet (full of fruits, vegetables, whole grains, lean protein, water--limit salt, fat, and sugar intake) and increase physical activity as tolerated.  Continue doing brain stimulating activities (puzzles, reading, staying active) to keep memory sharp.   Bring a copy of your living will and/or healthcare power of attorney to your next office visit.  Please check with your insurance about Cologuard coverage.  Please look into carbon monoxide detector for your home safety.  HAPPY NEW YEAR!!!   Walter Brown , Thank you for taking time to come for your Medicare Wellness Visit. I appreciate your ongoing commitment to your health goals. Please review the following plan we discussed and let me know if I can assist you in the future.   These are the goals we discussed: Goals    . Come off of Zoloft and Statins    . Drink 6-8 glasses of water per day.    . Weight (lb) < 170 lb (77.1 kg) (pt-stated)     By continuing to exercise and eating a low carb diet.       This is a list of the screening recommended for you and due dates:  Health Maintenance  Topic Date Due  . Tetanus Vaccine  11/05/2023  . Flu Shot  Completed  . Pneumonia vaccines  Completed    Health Maintenance After Age 70 After age 51, you are at a higher risk for certain long-term diseases and infections as well as injuries from falls. Falls are a major cause of broken bones and head injuries in people who are older than age 20. Getting regular preventive care can help to keep you healthy and well. Preventive care includes getting regular testing and making lifestyle changes as recommended by your health care provider. Talk with your health care provider about:  Which screenings and tests you should have. A screening is a test that checks for a disease when you have no symptoms.  A diet and exercise plan that is right for  you. What should I know about screenings and tests to prevent falls? Screening and testing are the best ways to find a health problem early. Early diagnosis and treatment give you the best chance of managing medical conditions that are common after age 74. Certain conditions and lifestyle choices may make you more likely to have a fall. Your health care provider may recommend:  Regular vision checks. Poor vision and conditions such as cataracts can make you more likely to have a fall. If you wear glasses, make sure to get your prescription updated if your vision changes.  Medicine review. Work with your health care provider to regularly review all of the medicines you are taking, including over-the-counter medicines. Ask your health care provider about any side effects that may make you more likely to have a fall. Tell your health care provider if any medicines that you take make you feel dizzy or sleepy.  Osteoporosis screening. Osteoporosis is a condition that causes the bones to get weaker. This can make the bones weak and cause them to break more easily.  Blood pressure screening. Blood pressure changes and medicines to control blood pressure can make you feel dizzy.  Strength and balance checks. Your health care provider may recommend certain tests to check your strength and balance while standing, walking, or changing positions.  Foot health exam. Foot pain and numbness, as well as not wearing proper  footwear, can make you more likely to have a fall.  Depression screening. You may be more likely to have a fall if you have a fear of falling, feel emotionally low, or feel unable to do activities that you used to do.  Alcohol use screening. Using too much alcohol can affect your balance and may make you more likely to have a fall. What actions can I take to lower my risk of falls? General instructions  Talk with your health care provider about your risks for falling. Tell your health care  provider if: ? You fall. Be sure to tell your health care provider about all falls, even ones that seem minor. ? You feel dizzy, sleepy, or off-balance.  Take over-the-counter and prescription medicines only as told by your health care provider. These include any supplements.  Eat a healthy diet and maintain a healthy weight. A healthy diet includes low-fat dairy products, low-fat (lean) meats, and fiber from whole grains, beans, and lots of fruits and vegetables. Home safety  Remove any tripping hazards, such as rugs, cords, and clutter.  Install safety equipment such as grab bars in bathrooms and safety rails on stairs.  Keep rooms and walkways well-lit. Activity   Follow a regular exercise program to stay fit. This will help you maintain your balance. Ask your health care provider what types of exercise are appropriate for you.  If you need a cane or walker, use it as recommended by your health care provider.  Wear supportive shoes that have nonskid soles. Lifestyle  Do not drink alcohol if your health care provider tells you not to drink.  If you drink alcohol, limit how much you have: ? 0-1 drink a day for women. ? 0-2 drinks a day for men.  Be aware of how much alcohol is in your drink. In the [place] one drink equals one typical bottle of beer (12 oz), one-half glass of wine (5 oz), or one shot of hard liquor (1 oz).  Do not use any products that contain nicotine or tobacco, such as cigarettes and e-cigarettes. If you need help quitting, ask your health care provider. Summary  Having a healthy lifestyle and getting preventive care can help to protect your health and wellness after age 80.  Screening and testing are the best way to find a health problem early and help you avoid having a fall. Early diagnosis and treatment give you the best chance for managing medical conditions that are more common for people who are older than age 63.  Falls are a major cause of broken  bones and head injuries in people who are older than age 63. Take precautions to prevent a fall at home.  Work with your health care provider to learn what changes you can make to improve your health and wellness and to prevent falls. This information is not intended to replace advice given to you by your health care provider. Make sure you discuss any questions you have with your health care provider. Document Released: 08/06/2017 Document Revised: 08/06/2017 Document Reviewed: 08/06/2017 Elsevier Interactive Patient Education  2019 [employer].

## 2018-10-06 NOTE — Progress Notes (Signed)
Patient see in office on 10/05/18 this was gone over nothing further needed at this time

## 2018-10-12 IMAGING — CR DG CHEST 2V
2 series · 2 of 2 positions shown · non-contrast
Comparison: 07/02/2012

CLINICAL DATA: Shortness of breath, productive cough.

EXAM:
CHEST - 2 VIEW

[w chest pa]
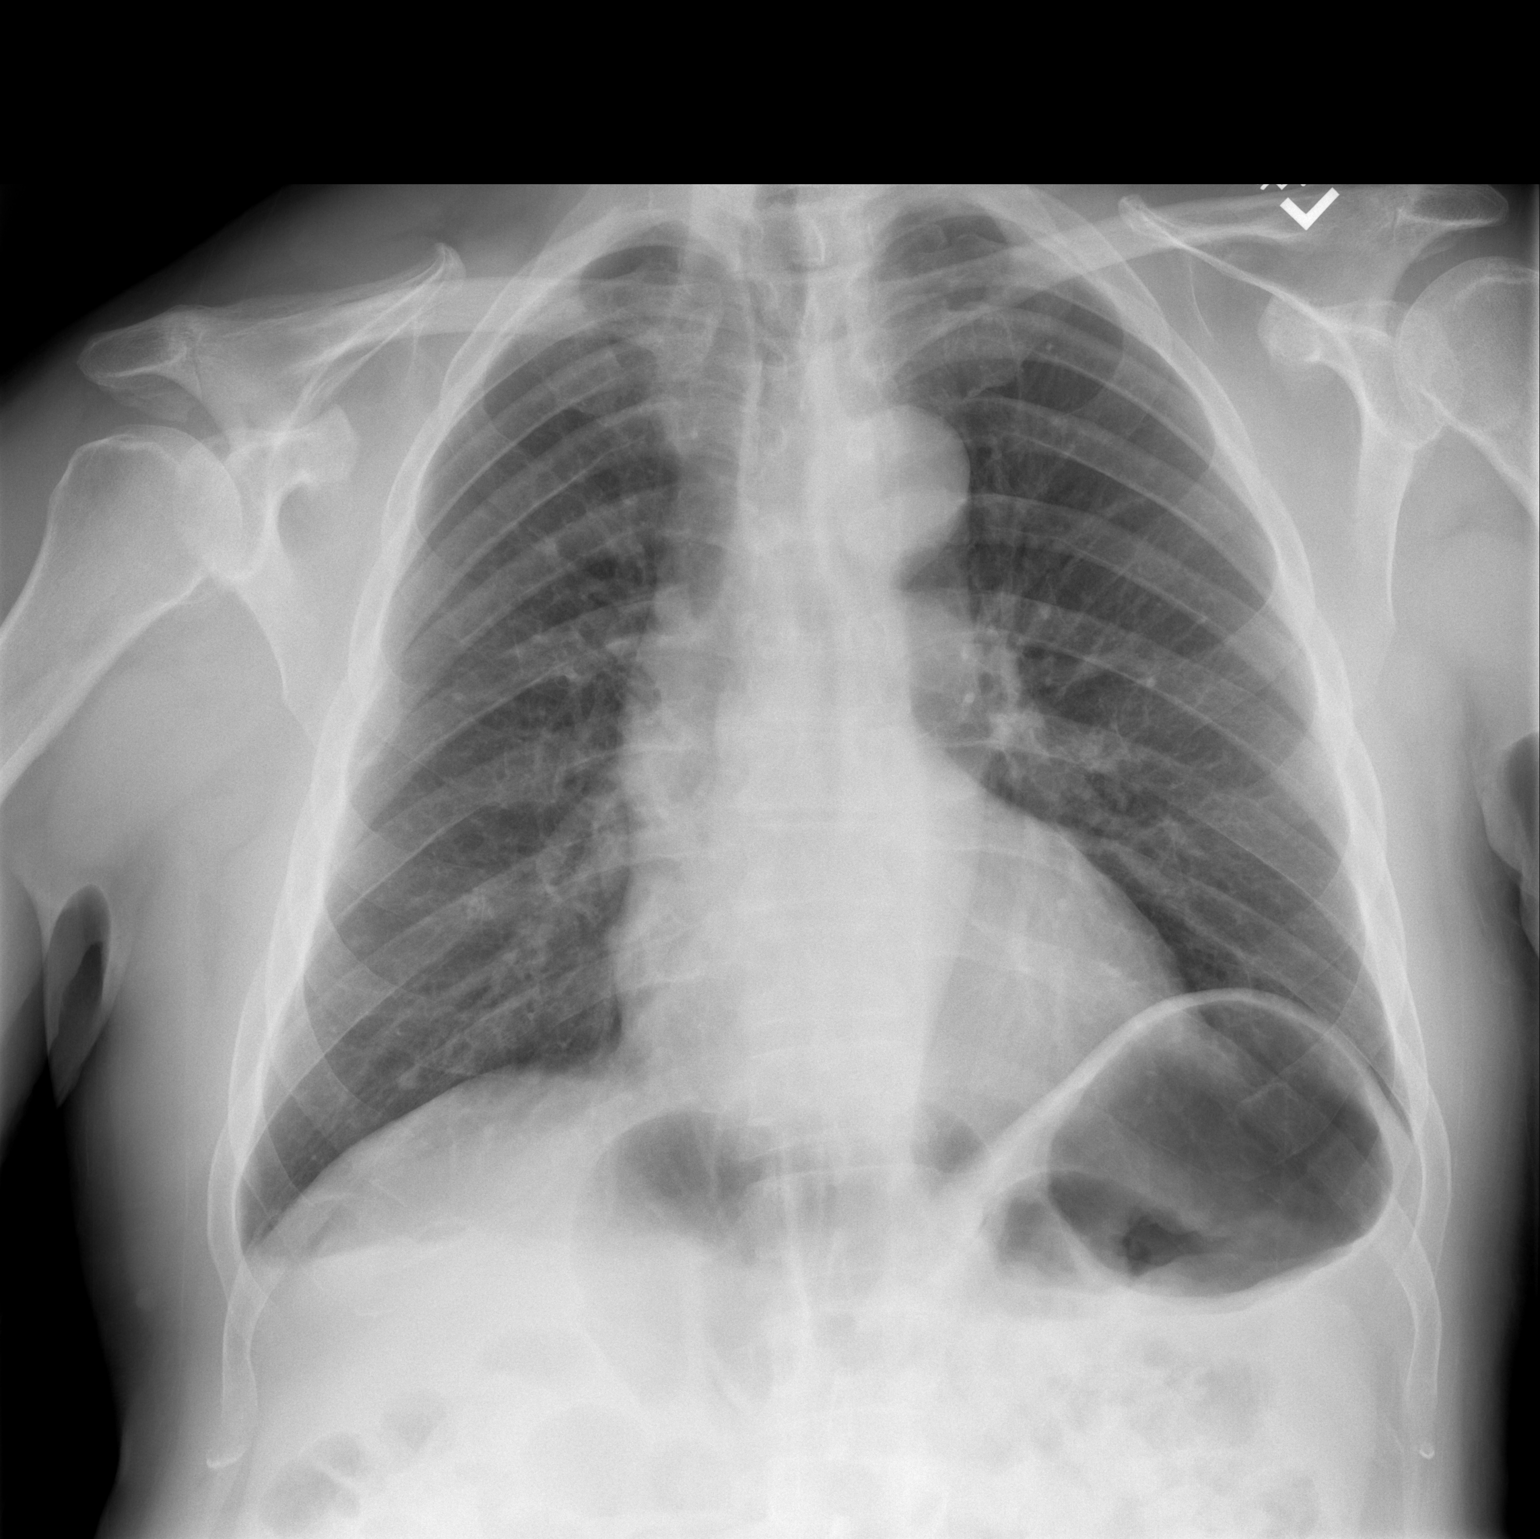

[w chest lat]
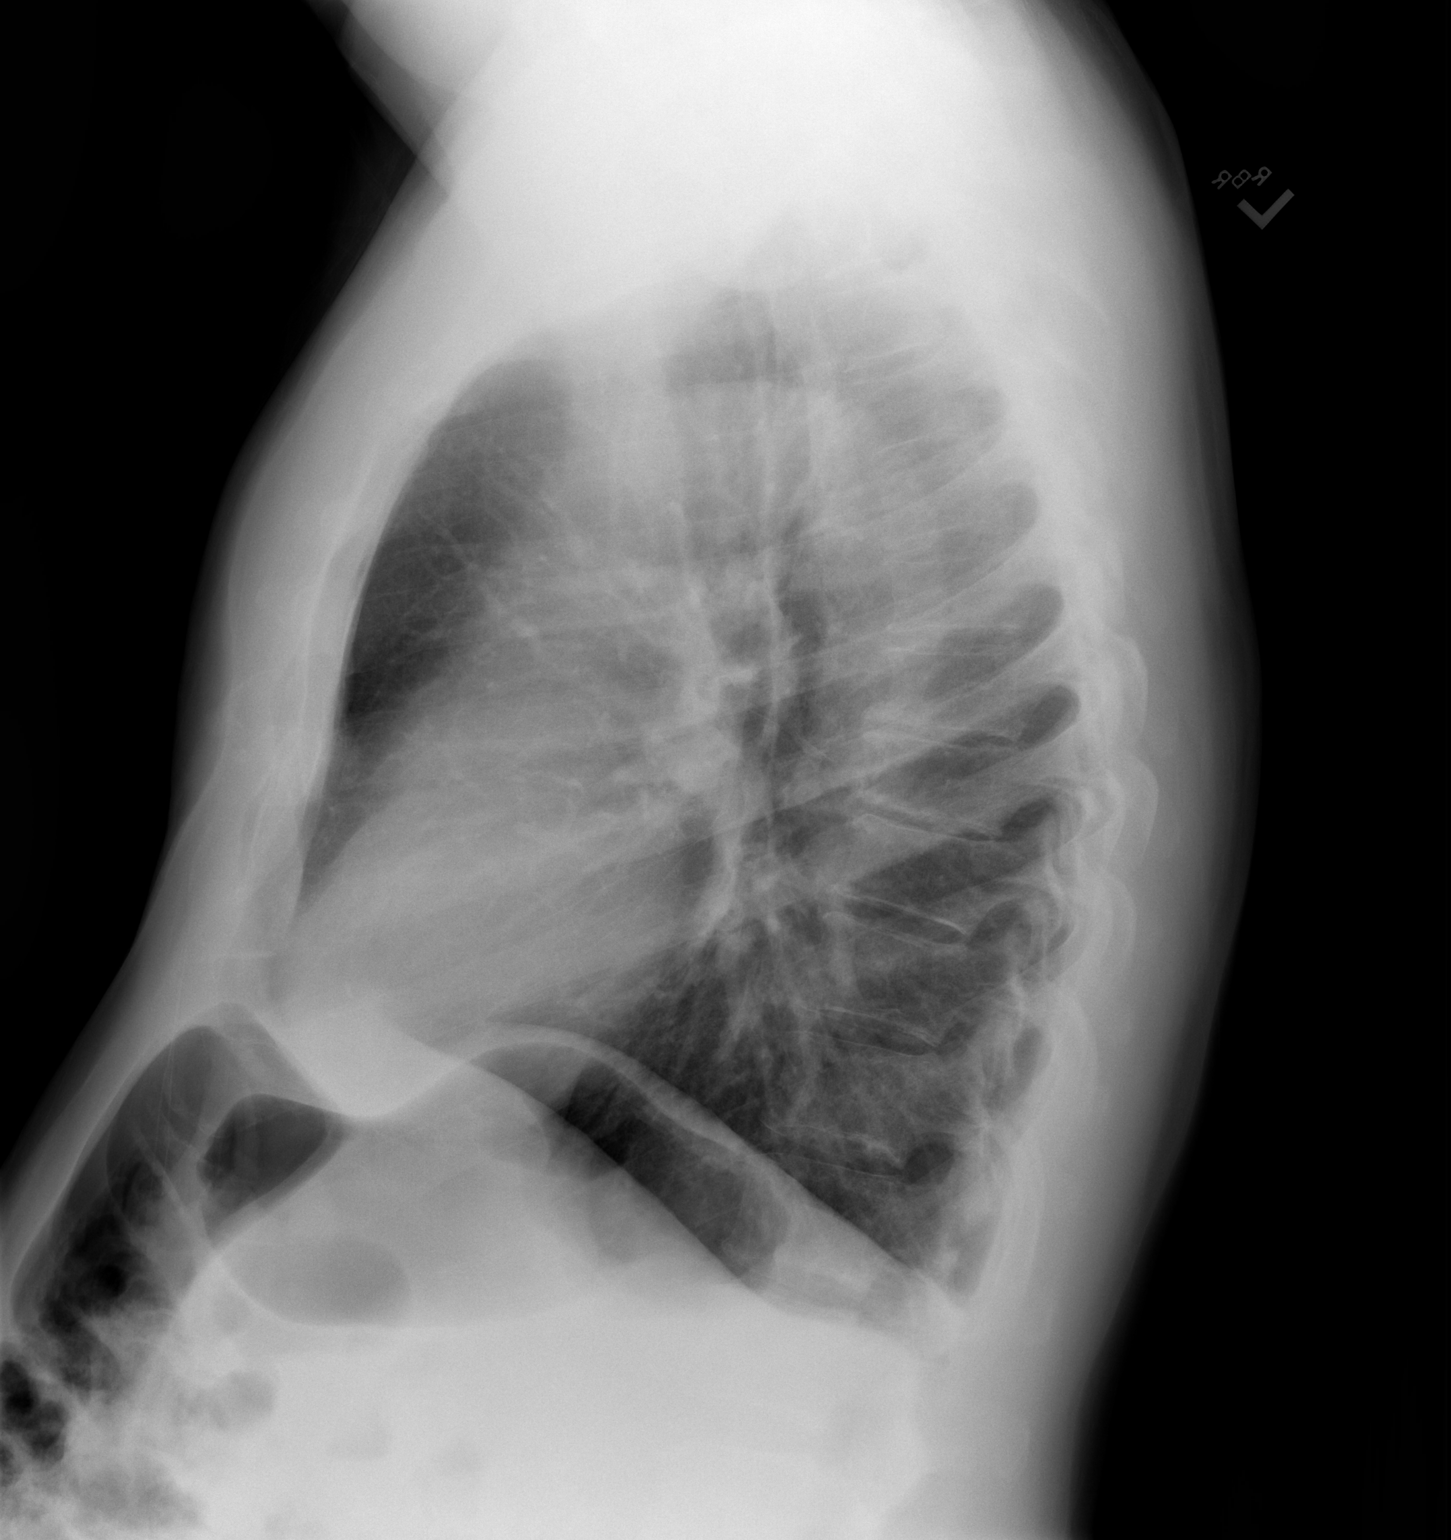

[2 of 2 positions shown; findings below may reference images not displayed]

FINDINGS: Cardiomediastinal silhouette is normal. Mediastinal contours appear
intact. Mild calcific atherosclerotic disease and tortuosity of the
aorta.

There is no evidence of focal airspace consolidation, pleural
effusion or pneumothorax.

Osseous structures are without acute abnormality. Soft tissues are
grossly normal.
IMPRESSION: No active cardiopulmonary disease.

## 2018-10-15 ENCOUNTER — Encounter: Payer: Self-pay | Admitting: Family Medicine

## 2018-10-15 ENCOUNTER — Ambulatory Visit (INDEPENDENT_AMBULATORY_CARE_PROVIDER_SITE_OTHER): Payer: Medicare HMO | Admitting: Family Medicine

## 2018-10-15 VITALS — BP 118/80 | HR 54 | Temp 97.7°F | Resp 98 | Ht 64.0 in | Wt 182.0 lb

## 2018-10-15 DIAGNOSIS — E782 Mixed hyperlipidemia: Secondary | ICD-10-CM | POA: Diagnosis not present

## 2018-10-15 DIAGNOSIS — D696 Thrombocytopenia, unspecified: Secondary | ICD-10-CM | POA: Diagnosis not present

## 2018-10-15 DIAGNOSIS — I10 Essential (primary) hypertension: Secondary | ICD-10-CM | POA: Diagnosis not present

## 2018-10-15 DIAGNOSIS — I480 Paroxysmal atrial fibrillation: Secondary | ICD-10-CM

## 2018-10-15 DIAGNOSIS — R001 Bradycardia, unspecified: Secondary | ICD-10-CM

## 2018-10-15 LAB — COMPREHENSIVE METABOLIC PANEL
ALT: 10 U/L (ref 0–53)
AST: 12 U/L (ref 0–37)
Albumin: 4.2 g/dL (ref 3.5–5.2)
Alkaline Phosphatase: 60 U/L (ref 39–117)
BILIRUBIN TOTAL: 1 mg/dL (ref 0.2–1.2)
BUN: 15 mg/dL (ref 6–23)
CALCIUM: 9.4 mg/dL (ref 8.4–10.5)
CO2: 28 meq/L (ref 19–32)
CREATININE: 1.08 mg/dL (ref 0.40–1.50)
Chloride: 106 mEq/L (ref 96–112)
GFR: 70.37 mL/min (ref >60.00)
Glucose, Bld: 79 mg/dL (ref 70–99)
Potassium: 4.5 mEq/L (ref 3.5–5.1)
SODIUM: 140 meq/L (ref 135–145)
Total Protein: 6.3 g/dL (ref 6.0–8.3)

## 2018-10-15 LAB — LIPID PANEL
CHOL/HDL RATIO: 2
Cholesterol: 116 mg/dL (ref 0–200)
HDL: 55.9 mg/dL (ref >39.00)
LDL Cholesterol: 46 mg/dL (ref 0–99)
NONHDL: 60.33
TRIGLYCERIDES: 71 mg/dL (ref 0.0–149.0)
VLDL: 14.2 mg/dL (ref 0.0–40.0)

## 2018-10-15 NOTE — Assessment & Plan Note (Signed)
Encouraged heart healthy diet, increase exercise, avoid trans fats, consider a krill oil cap daily 

## 2018-10-15 NOTE — Telephone Encounter (Signed)
This MyChart encounter was inadvertently sent to the incorrect pool and therefor was not able to be seen by Pulmonary. Pt has been seen by Pulmonary on 12.30.19. Will sign off.

## 2018-10-15 NOTE — Telephone Encounter (Signed)
This MyChart encounter was inadvertently sent to the incorrect pool and therefor was not able to be seen by Pulmonary. Pt has been seen by Pulmonary on 12.30.2019. Will sign off.

## 2018-10-15 NOTE — Progress Notes (Signed)
Subjective:    Patient ID: Walter Brown, male    DOB: 1940-12-10, 78 y.o.   MRN: [id number]  Chief Complaint  Patient presents with  . Hypertension    Here for follow up,not taking only 1/2 HCTZ    HPI Patient is in today for follow up. He feels well. No recent febrile illness or hospitalizations. No polyuria or polydipsia. Continues to stay active and maintain a heart healthy diet. Denies CP/palp/SOB/HA/congestion/fevers/GI or GU c/o. Taking meds as prescribed  Past Medical History:  Diagnosis Date  . Anemia    mild  . Arthritis 04/06/2017  . Asthma    childhood  . Basal cell carcinoma    skin- on nose- basal cell (20 yrs ago) forehead 1 year ago  . BPH (benign prostatic hyperplasia)   . Cerumen impaction 08/10/2012  . Chicken pox as child  . Depression 08/01/2012  . [place] measles as a child  . Hyperlipidemia   . Hypertension   . Macular degeneration   . Medicare annual wellness visit, subsequent 11/07/2013  . Otitis externa 08/10/2012  . PAF (paroxysmal atrial fibrillation) ([place])   . Preventative health care 08/01/2012  . Sleep apnea   . Stroke ([place])   . Thrombocytopenia ([place])     Past Surgical History:  Procedure Laterality Date  . [place]     very young, b/l  . EXCISIONAL HEMORRHOIDECTOMY    . HYDROCELE EXCISION / REPAIR  2012   b/l  . SKIN CANCER EXCISION     nose and forehead    Family History  Problem Relation Age of Onset  . Hypertension Mother   . Other Mother        MRSA  . Heart disease Mother        stents  . Parkinsonism Father   . Heart disease Father   . Hypertension Father   . Prostate cancer Father 35  . Prostate cancer Maternal Grandfather   . ADD / ADHD Son        ADHD  . Other Son 24       part of 1 lung removed- due to infection  . Stroke Maternal Grandmother   . Cancer Paternal Grandfather        EYE    Social History   Socioeconomic History  . Marital status: Married    Spouse name: Not on file  . Number of  children: 2  . Years of education: Not on file  . Highest education level: Not on file  Occupational History  . Occupation: retired  Scientific laboratory technician  . Financial resource strain: Not on file  . Food insecurity:    Worry: Not on file    Inability: Not on file  . Transportation needs:    Medical: Not on file    Non-medical: Not on file  Tobacco Use  . Smoking status: Former Smoker    Packs/day: 1.50    Years: 15.00    Pack years: 22.50    Start date: 10/07/1978  . Smokeless tobacco: Never Used  Substance and Sexual Activity  . Alcohol use: Yes    Comment: 2 glasses of wine daily  . Drug use: No  . 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  Lifestyle  . Physical activity:    Days per week: Not on file    Minutes per session: Not on file  . Stress: Not on  file  Relationships  . Social connections:    Talks on phone: Not on file    Gets together: Not on file    Attends religious service: Not on file    Active member of club or organization: Not on file    Attends meetings of clubs or organizations: Not on file    Relationship status: Not on file  . Intimate partner violence:    Fear of current or ex partner: Not on file    Emotionally abused: Not on file    Physically abused: Not on file    Forced sexual activity: Not on file  Other Topics Concern  . Not on file  Social History Narrative  . Not on file    Outpatient Medications Prior to Visit  Medication Sig Dispense Refill  . amiodarone (PACERONE) 200 MG tablet Take 1 tablet (200 mg total) by mouth daily. 90 tablet 0  . apixaban (ELIQUIS) 5 MG TABS tablet Take 1 tablet (5 mg total) by mouth 2 (two) times daily. 180 tablet 0  . hydrochlorothiazide (HYDRODIURIL) 25 MG tablet Take 1 tablet (25 mg total) by mouth daily. (Patient taking differently: Take 12.5 mg by mouth daily. ) 90 tablet 1  . Multiple Vitamins-Minerals (OCUVITE  ADULT 50+ PO) Take 1 tablet by mouth daily.    . rosuvastatin (CRESTOR) 20 MG tablet Take 0.5 tablets (10 mg total) by mouth daily. 90 tablet 1  . sertraline (ZOLOFT) 25 MG tablet Take 1 tablet (25 mg total) by mouth daily. (Patient taking differently: Take 12.5 mg by mouth daily. ) 90 tablet 1  . terazosin (HYTRIN) 10 MG capsule Take 10 mg by mouth at bedtime.     No facility-administered medications prior to visit.     Allergies  Allergen Reactions  . Albuterol Sulfate Palpitations  . Azithromycin Other (See Comments)    Hepatotoxicity  . Doxycycline Hyclate Other (See Comments)    Taken with Azithromycin and had Heaptotoxicity    Review of Systems  Constitutional: Negative for fever and malaise/fatigue.  HENT: Negative for congestion.   Eyes: Negative for blurred vision.  Respiratory: Negative for shortness of breath.   Cardiovascular: Negative for chest pain, palpitations and leg swelling.  Gastrointestinal: Negative for abdominal pain, blood in stool and nausea.  Genitourinary: Negative for dysuria and frequency.  Musculoskeletal: Negative for falls.  Skin: Negative for rash.  Neurological: Negative for dizziness, loss of consciousness and headaches.  Endo/Heme/Allergies: Negative for environmental allergies.  Psychiatric/Behavioral: Negative for depression. The patient is not nervous/anxious.        Objective:    Physical Exam Vitals signs and nursing note reviewed.  Constitutional:      General: He is not in acute distress.    Appearance: He is well-developed.  HENT:     Head: Normocephalic and atraumatic.     Nose: Nose normal.  Eyes:     General:        Right eye: No discharge.        Left eye: No discharge.  Neck:     Musculoskeletal: Normal range of motion and neck supple.  Cardiovascular:     Rate and Rhythm: Normal rate and regular rhythm.     Heart sounds: No murmur.  Pulmonary:     Effort: Pulmonary effort is normal.     Breath sounds: Normal  breath sounds.  Abdominal:     General: Bowel sounds are normal.     Palpations: Abdomen is soft.  Tenderness: There is no abdominal tenderness.  Skin:    General: Skin is warm and dry.  Neurological:     Mental Status: He is alert and oriented to person, place, and time.     BP 118/80 (BP Location: Left Arm, Patient Position: Sitting, Cuff Size: Small)   Pulse (!) 54   Temp 97.7 F (36.5 C) (Oral)   Resp (!) 98   Ht 5\' 4"  (1.626 m)   Wt 182 lb (82.6 kg)   SpO2 98%   BMI 31.24 kg/m  Wt Readings from Last 3 Encounters:  10/15/18 182 lb (82.6 kg)  10/05/18 184 lb 9.6 oz (83.7 kg)  10/05/18 184 lb 12.8 oz (83.8 kg)     Lab Results  Component Value Date   WBC 6.5 09/09/2018   HGB 13.8 09/09/2018   HCT 39.1 09/09/2018   PLT 137 (L) 09/09/2018   GLUCOSE 79 10/15/2018   CHOL 116 10/15/2018   TRIG 71.0 10/15/2018   HDL 55.90 10/15/2018   LDLCALC 46 10/15/2018   ALT 10 10/15/2018   AST 12 10/15/2018   NA 140 10/15/2018   K 4.5 10/15/2018   CL 106 10/15/2018   CREATININE 1.08 10/15/2018   BUN 15 10/15/2018   CO2 28 10/15/2018   TSH 1.430 09/09/2018   INR 1.0 12/15/2017    Lab Results  Component Value Date   TSH 1.430 09/09/2018   Lab Results  Component Value Date   WBC 6.5 09/09/2018   HGB 13.8 09/09/2018   HCT 39.1 09/09/2018   MCV 89 09/09/2018   PLT 137 (L) 09/09/2018   Lab Results  Component Value Date   NA 140 10/15/2018   K 4.5 10/15/2018   CO2 28 10/15/2018   GLUCOSE 79 10/15/2018   BUN 15 10/15/2018   CREATININE 1.08 10/15/2018   BILITOT 1.0 10/15/2018   ALKPHOS 60 10/15/2018   AST 12 10/15/2018   ALT 10 10/15/2018   PROT 6.3 10/15/2018   ALBUMIN 4.2 10/15/2018   CALCIUM 9.4 10/15/2018   ANIONGAP 12 08/27/2018   GFR 70.37 10/15/2018   Lab Results  Component Value Date   CHOL 116 10/15/2018   Lab Results  Component Value Date   HDL 55.90 10/15/2018   Lab Results  Component Value Date   LDLCALC 46 10/15/2018   Lab Results    Component Value Date   TRIG 71.0 10/15/2018   Lab Results  Component Value Date   CHOLHDL 2 10/15/2018   No results found for: HGBA1C     Assessment & Plan:   Problem List Items Addressed This Visit    Thrombocytopenia ([place])    Mild, stable and asymptomatic      Hyperlipidemia - Primary    Encouraged heart healthy diet, increase exercise, avoid trans fats, consider a krill oil cap daily      Relevant Orders   Lipid panel (Completed)   Hypertension    Well controlled, no changes to meds. Encouraged heart healthy diet such as the DASH diet and exercise as tolerated.       Relevant Orders   Comprehensive metabolic panel (Completed)   Bradycardia    Mild and asymptomatic      PAF (paroxysmal atrial fibrillation) ([facility])    Tolerating Eliquis         I am having Walter Brown maintain his Multiple Vitamins-Minerals (OCUVITE ADULT 50+ PO), terazosin, hydrochlorothiazide, sertraline, apixaban, amiodarone, and rosuvastatin.  No orders of the defined types were placed in this  encounter.    Penni Homans, MD

## 2018-10-15 NOTE — Assessment & Plan Note (Signed)
Tolerating Eliquis 

## 2018-10-15 NOTE — Assessment & Plan Note (Signed)
Mild, stable and asymptomatic

## 2018-10-15 NOTE — Assessment & Plan Note (Signed)
Well controlled, no changes to meds. Encouraged heart healthy diet such as the DASH diet and exercise as tolerated.  °

## 2018-10-15 NOTE — Assessment & Plan Note (Signed)
- 

## 2018-10-16 DIAGNOSIS — D1730 Benign lipomatous neoplasm of skin and subcutaneous tissue of unspecified sites: Secondary | ICD-10-CM | POA: Diagnosis not present

## 2018-10-29 DIAGNOSIS — H353221 Exudative age-related macular degeneration, left eye, with active choroidal neovascularization: Secondary | ICD-10-CM | POA: Diagnosis not present

## 2018-10-29 DIAGNOSIS — H353122 Nonexudative age-related macular degeneration, left eye, intermediate dry stage: Secondary | ICD-10-CM | POA: Diagnosis not present

## 2018-10-29 DIAGNOSIS — H43813 Vitreous degeneration, bilateral: Secondary | ICD-10-CM | POA: Diagnosis not present

## 2018-10-30 ENCOUNTER — Telehealth: Payer: Self-pay | Admitting: Emergency Medicine

## 2018-10-30 NOTE — Telephone Encounter (Signed)
Patient scheduled for follow up per mychart message for Dr. Geraldo Pitter. He was offered appointment next week in [place], he declined and would like to be seen in [place]

## 2018-11-05 DIAGNOSIS — H353221 Exudative age-related macular degeneration, left eye, with active choroidal neovascularization: Secondary | ICD-10-CM | POA: Diagnosis not present

## 2018-11-09 ENCOUNTER — Telehealth: Payer: Self-pay | Admitting: *Deleted

## 2018-11-09 ENCOUNTER — Encounter: Payer: Self-pay | Admitting: Cardiology

## 2018-11-09 ENCOUNTER — Ambulatory Visit (INDEPENDENT_AMBULATORY_CARE_PROVIDER_SITE_OTHER): Payer: Medicare HMO | Admitting: Cardiology

## 2018-11-09 VITALS — BP 118/70 | HR 67 | Ht 64.0 in | Wt 178.0 lb

## 2018-11-09 DIAGNOSIS — I480 Paroxysmal atrial fibrillation: Secondary | ICD-10-CM

## 2018-11-09 DIAGNOSIS — G4730 Sleep apnea, unspecified: Secondary | ICD-10-CM | POA: Diagnosis not present

## 2018-11-09 DIAGNOSIS — Z79899 Other long term (current) drug therapy: Secondary | ICD-10-CM

## 2018-11-09 DIAGNOSIS — G4733 Obstructive sleep apnea (adult) (pediatric): Secondary | ICD-10-CM

## 2018-11-09 DIAGNOSIS — I10 Essential (primary) hypertension: Secondary | ICD-10-CM | POA: Diagnosis not present

## 2018-11-09 DIAGNOSIS — E782 Mixed hyperlipidemia: Secondary | ICD-10-CM | POA: Diagnosis not present

## 2018-11-09 DIAGNOSIS — G459 Transient cerebral ischemic attack, unspecified: Secondary | ICD-10-CM

## 2018-11-09 HISTORY — DX: Obstructive sleep apnea (adult) (pediatric): G47.33

## 2018-11-09 MED ORDER — ROSUVASTATIN CALCIUM 10 MG PO TABS: 5.0000 mg | ORAL_TABLET | Freq: Every day | ORAL | 3 refills | Status: DC

## 2018-11-09 NOTE — Telephone Encounter (Signed)
-----   Message from Austin Miles, RN sent at 11/09/2018 10:11 AM EST ----- Regarding: Please precert Please precert this patient to have a sleep study due to history of sleep apnea per Dr. Geraldo Pitter. Thanks!

## 2018-11-09 NOTE — Patient Instructions (Addendum)
Medication Instructions:  Your physician has recommended you make the following change in your medication:   DECREASE rosuvastatin (crestor) 10 mg: Take 0.5 tablet (5 mg) daily   If you need a refill on your cardiac medications before your next appointment, please call your pharmacy.   Lab work: Your physician recommends that you return for lab work today: amiodarone level.   If you have labs (blood work) drawn today and your tests are completely normal, you will receive your results only by: Marland Kitchen MyChart Message (if you have MyChart) OR . A paper copy in the mail If you have any lab test that is abnormal or we need to change your treatment, we will call you to review the results.  Testing/Procedures: Your physician has recommended that you have a sleep study. This test records several body functions during sleep, including: brain activity, eye movement, oxygen and carbon dioxide blood levels, heart rate and rhythm, breathing rate and rhythm, the flow of air through your mouth and nose, snoring, body muscle movements, and chest and belly movement.  Your physician has recommended that you wear a ZIO monitor. ZIO monitors are medical devices that record the heart's electrical activity. Doctors most often use these monitors to diagnose arrhythmias. Arrhythmias are problems with the speed or rhythm of the heartbeat. The monitor is a small, portable device. You can wear one while you do your normal daily activities. This is usually used to diagnose what is causing palpitations/syncope (passing out). Wear for 14 days.   Follow-Up: At [name], you and your health needs are our priority.  As part of our continuing mission to provide you with exceptional heart care, we have created designated Provider Care Teams.  These Care Teams include your primary Cardiologist (physician) and Advanced Practice Providers (APPs -  Physician Assistants and Nurse Practitioners) who all work together to provide you  with the care you need, when you need it. You will need a follow up appointment in 6 months.  Please call our office 2 months in advance to schedule this appointment.    Rosuvastatin Tablets What is this medicine? ROSUVASTATIN (roe SOO va sta tin) is known as a HMG-CoA reductase inhibitor or 'statin'. It lowers cholesterol and triglycerides in the blood. This drug may also reduce the risk of heart attack, stroke, or other health problems in patients with risk factors for heart disease. Diet and lifestyle changes are often used with this drug. This medicine may be used for other purposes; ask your health care provider or pharmacist if you have questions. COMMON BRAND NAME(S): Crestor What should I tell my health care provider before I take this medicine? They need to know if you have any of these conditions: -diabetes -if you often drink alcohol -history of stroke -kidney disease -liver disease -muscle aches or weakness -thyroid disease -an unusual or allergic reaction to rosuvastatin, other medicines, foods, dyes, or preservatives -pregnant or trying to get pregnant -breast-feeding How should I use this medicine? Take this medicine by mouth with a glass of water. Follow the directions on the prescription label. Do not cut, crush or chew this medicine. You can take this medicine with or without food. Take your doses at regular intervals. Do not take your medicine more often than directed. Talk to your pediatrician regarding the use of this medicine in children. While this drug may be prescribed for children as young as 73 years old for selected conditions, precautions do apply. Overdosage: If you think you have taken too  much of this medicine contact a poison control center or emergency room at once. NOTE: This medicine is only for you. Do not share this medicine with others. What if I miss a dose? If you miss a dose, take it as soon as you can. If your next dose is to be taken in less than  12 hours, then do not take the missed dose. Take the next dose at your regular time. Do not take double or extra doses. What may interact with this medicine? Do not take this medicine with any of the following medications: -herbal medicines like red yeast rice This medicine may also interact with the following medications: -alcohol -antacids containing aluminum hydroxide or magnesium hydroxide -cyclosporine -other medicines for high cholesterol -some medicines for HIV infection -warfarin This list may not describe all possible interactions. Give your health care provider a list of all the medicines, herbs, non-prescription drugs, or dietary supplements you use. Also tell them if you smoke, drink alcohol, or use illegal drugs. Some items may interact with your medicine. What should I watch for while using this medicine? Visit your doctor or health care professional for regular check-ups. You may need regular tests to make sure your liver is working properly. Your health care professional may tell you to stop taking this medicine if you develop muscle problems. If your muscle problems do not go away after stopping this medicine, contact your health care professional. Do not become pregnant while taking this medicine. Women should inform their health care professional if they wish to become pregnant or think they might be pregnant. There is a potential for serious side effects to an unborn child. Talk to your health care professional or pharmacist for more information. Do not breast-feed an infant while taking this medicine. This medicine may affect blood sugar levels. If you have diabetes, check with your doctor or health care professional before you change your diet or the dose of your diabetic medicine. If you are going to need surgery or other procedure, tell your doctor that you are using this medicine. This drug is only part of a total heart-health program. Your doctor or a dietician can suggest  a low-cholesterol and low-fat diet to help. Avoid alcohol and smoking, and keep a proper exercise schedule. This medicine may cause a decrease in Co-Enzyme Q-10. You should make sure that you get enough Co-Enzyme Q-10 while you are taking this medicine. Discuss the foods you eat and the vitamins you take with your health care professional. What side effects may I notice from receiving this medicine? Side effects that you should report to your doctor or health care professional as soon as possible: -allergic reactions like skin rash, itching or hives, swelling of the face, lips, or tongue -dark urine -fever -joint pain -muscle cramps, pain -redness, blistering, peeling or loosening of the skin, including inside the mouth -trouble passing urine or change in the amount of urine -unusually weak or tired -yellowing of the eyes or skin Side effects that usually do not require medical attention (report to your doctor or health care professional if they continue or are bothersome): -constipation -heartburn -nausea -stomach gas, pain, upset This list may not describe all possible side effects. Call your doctor for medical advice about side effects. You may report side effects to FDA at 1-800-FDA-1088. Where should I keep my medicine? Keep out of the reach of children. Store at room temperature between 20 and 25 degrees C (68 and 77 degrees F). Keep container tightly  closed (protect from moisture). Throw away any unused medicine after the expiration date. NOTE: This sheet is a summary. It may not cover all possible information. If you have questions about this medicine, talk to your doctor, pharmacist, or health care provider.  2019 Elsevier/Gold Standard (2017-05-27 12:42:43)

## 2018-11-09 NOTE — Addendum Note (Signed)
Addended by: Austin Miles on: 11/09/2018 10:18 AM   Modules accepted: Orders

## 2018-11-09 NOTE — Progress Notes (Signed)
Cardiology Office Note:    Date:  11/09/2018   ID:  Walter Brown, DOB 1941-04-20, MRN [id number]  PCP:  [name], MD  Cardiologist:  Jenean Lindau, MD   Referring MD: [name], MD    ASSESSMENT:    1. PAF (paroxysmal atrial fibrillation) ([place])   2. TIA (transient ischemic attack)   3. Essential hypertension   4. Mixed hyperlipidemia   5. Sleep apnea, unspecified type    PLAN:    In order of problems listed above:  1. Primary prevention stressed with the patient.  Importance of compliance with diet and medication stressed and he vocalized understanding.  His blood pressure is stable. 2. In view of low blood pressures at times have cut his hydrochlorothiazide to half dose.  He will take 25 mg half tablet daily. 3. Also I cut down his rosuvastatin to 10 mg half tablet daily.  Currently he is doing double that dose. 4. We will refer him for a sleep study in view of his sleep apnea history and also his paroxysmal atrial fibrillation.  We will also check an amiodarone level today. 5. In view of palpitations we will do a follow-up follow 14-day monitoring. 6. Patient will be seen in follow-up appointment in 6 months or earlier if the patient has any concerns    Medication Adjustments/Labs and Tests Ordered: Current medicines are reviewed at length with the patient today.  Concerns regarding medicines are outlined above.  No orders of the defined types were placed in this encounter.  No orders of the defined types were placed in this encounter.    History of Present Illness:    Walter Brown is a 78 y.o. male.  Patient has past medical history of TIA, paroxysmal atrial fibrillation, essential hypertension and dyslipidemia.  He mentions to me that he is doing much better since he is on the amiodarone therapy.  He says he has had 2 episodes of palpitations when he exerts himself and his smart watch showed a heart rate of 160.  No chest pain orthopnea PND.  Again he  is a very active gentleman.  He mentions to me that he was told to have sleep apnea in the past and never followed up on it.  At the time of my evaluation, the patient is alert awake oriented and in no distress.  Past Medical History:  Diagnosis Date  . Anemia    mild  . Arthritis 04/06/2017  . Asthma    childhood  . Basal cell carcinoma    skin- on nose- basal cell (20 yrs ago) forehead 1 year ago  . BPH (benign prostatic hyperplasia)   . Cerumen impaction 08/10/2012  . Chicken pox as child  . Depression 08/01/2012  . [place] measles as a child  . Hyperlipidemia   . Hypertension   . Macular degeneration   . Medicare annual wellness visit, subsequent 11/07/2013  . Otitis externa 08/10/2012  . PAF (paroxysmal atrial fibrillation) ([place])   . Preventative health care 08/01/2012  . Sleep apnea   . Stroke ([place])   . Thrombocytopenia ([place])     Past Surgical History:  Procedure Laterality Date  . [place]     very young, b/l  . EXCISIONAL HEMORRHOIDECTOMY    . HYDROCELE EXCISION / REPAIR  2012   b/l  . SKIN CANCER EXCISION     nose and forehead    Current Medications: Current Meds  Medication Sig  . amiodarone (PACERONE) 200  MG tablet Take 1 tablet (200 mg total) by mouth daily.  Marland Kitchen apixaban (ELIQUIS) 5 MG TABS tablet Take 1 tablet (5 mg total) by mouth 2 (two) times daily.  . hydrochlorothiazide (HYDRODIURIL) 25 MG tablet Take 1 tablet (25 mg total) by mouth daily. (Patient taking differently: Take 12.5 mg by mouth daily. )  . Multiple Vitamins-Minerals (OCUVITE ADULT 50+ PO) Take 1 tablet by mouth daily.  . rosuvastatin (CRESTOR) 20 MG tablet Take 0.5 tablets (10 mg total) by mouth daily.  . sertraline (ZOLOFT) 25 MG tablet Take 1 tablet (25 mg total) by mouth daily. (Patient taking differently: Take 12.5 mg by mouth daily. )  . terazosin (HYTRIN) 10 MG capsule Take 20 mg by mouth at bedtime.      Allergies:   Albuterol sulfate; Azithromycin; and Doxycycline hyclate     Social History   Socioeconomic History  . Marital status: Married    Spouse name: Not on file  . Number of children: 2  . Years of education: Not on file  . Highest education level: Not on file  Occupational History  . Occupation: retired  Scientific laboratory technician  . Financial resource strain: Not on file  . Food insecurity:    Worry: Not on file    Inability: Not on file  . Transportation needs:    Medical: Not on file    Non-medical: Not on file  Tobacco Use  . Smoking status: Former Smoker    Packs/day: 1.50    Years: 15.00    Pack years: 22.50    Start date: 10/07/1978  . Smokeless tobacco: Never Used  Substance and Sexual Activity  . Alcohol use: Yes    Comment: 2 glasses of wine daily  . Drug use: No  . 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  Lifestyle  . Physical activity:    Days per week: Not on file    Minutes per session: Not on file  . Stress: Not on file  Relationships  . Social connections:    Talks on phone: Not on file    Gets together: Not on file    Attends religious service: Not on file    Active member of club or organization: Not on file    Attends meetings of clubs or organizations: Not on file    Relationship status: Not on file  Other Topics Concern  . Not on file  Social History Narrative  . Not on file     Family History: The patient's family history includes ADD / ADHD in his son; Cancer in his paternal grandfather; Heart disease in his father and mother; Hypertension in his father and mother; Other in his mother; Other (age of onset: 64) in his son; Parkinsonism in his father; Prostate cancer in his maternal grandfather; Prostate cancer (age of onset: 49) in his father; Stroke in his maternal grandmother.  ROS:   Please see the history of present illness.    All other systems reviewed and are negative.  EKGs/Labs/Other Studies Reviewed:    The  following studies were reviewed today: Reports of the PFTs were discussed with him and they were unremarkable.   Recent Labs: 09/09/2018: Hemoglobin 13.8; Platelets 137; TSH 1.430 10/15/2018: ALT 10; BUN 15; Creatinine, Ser 1.08; Potassium 4.5; Sodium 140  Recent Lipid Panel    Component Value Date/Time   CHOL 116 10/15/2018 1100   TRIG 71.0 10/15/2018 1100  HDL 55.90 10/15/2018 1100   CHOLHDL 2 10/15/2018 1100   VLDL 14.2 10/15/2018 1100   LDLCALC 46 10/15/2018 1100    Physical Exam:    VS:  BP 118/70 (BP Location: Right Arm, Patient Position: Sitting, Cuff Size: Normal)   Pulse 67   Ht 5\' 4"  (1.626 m)   Wt 178 lb (80.7 kg)   SpO2 97%   BMI 30.55 kg/m     Wt Readings from Last 3 Encounters:  11/09/18 178 lb (80.7 kg)  10/15/18 182 lb (82.6 kg)  10/05/18 184 lb 9.6 oz (83.7 kg)     GEN: Patient is in no acute distress HEENT: Normal NECK: No JVD; No carotid bruits LYMPHATICS: No lymphadenopathy CARDIAC: S1 S2 regular, 2/6 systolic murmur at the apex. RESPIRATORY:  Clear to auscultation without rales, wheezing or rhonchi  ABDOMEN: Soft, non-tender, non-distended MUSCULOSKELETAL:  No edema; No deformity  SKIN: Warm and dry NEUROLOGIC:  Alert and oriented x 3 PSYCHIATRIC:  Normal affect    Signed, Jenean Lindau, MD  11/09/2018 10:01 AM    [place]

## 2018-11-09 NOTE — Telephone Encounter (Signed)
PA for sleep study submitted to [employer] via web portal. 

## 2018-11-11 ENCOUNTER — Telehealth: Payer: Self-pay

## 2018-11-11 DIAGNOSIS — L570 Actinic keratosis: Secondary | ICD-10-CM | POA: Diagnosis not present

## 2018-11-11 DIAGNOSIS — Z23 Encounter for immunization: Secondary | ICD-10-CM | POA: Diagnosis not present

## 2018-11-11 LAB — AMIODARONE LEVEL
Amiodarone, Serum: 0.5 ug/mL — ABNORMAL LOW (ref 1.0–2.5)
Noramiodarone,S: 0.4 ug/mL — ABNORMAL LOW (ref 1.0–2.5)

## 2018-11-11 NOTE — Telephone Encounter (Signed)
-----   Message from Jenean Lindau, MD sent at 11/11/2018  3:38 PM EST ----- The results of the study is unremarkable. Please inform patient. I will discuss in detail at next appointment. Cc  primary care/referring physician Jenean Lindau, MD 11/11/2018 3:38 PM

## 2018-11-11 NOTE — Telephone Encounter (Signed)
Patient called and notified of lab results. 

## 2018-11-12 ENCOUNTER — Ambulatory Visit (INDEPENDENT_AMBULATORY_CARE_PROVIDER_SITE_OTHER): Payer: Medicare HMO

## 2018-11-12 ENCOUNTER — Telehealth: Payer: Self-pay | Admitting: *Deleted

## 2018-11-12 DIAGNOSIS — I480 Paroxysmal atrial fibrillation: Secondary | ICD-10-CM

## 2018-11-12 NOTE — Telephone Encounter (Signed)
[employer] called with additional questions regarding the need for a sleep study. Spoke with Corey Skains C who forwarded me to Bernie T. After clarification and additional information was provided, patient has been approved for the sleep study. Authorization number is [id number] for case number [id number].   Attempted to contact the [facility] to schedule with no answer. Will try again tomorrow.

## 2018-11-13 NOTE — Telephone Encounter (Signed)
Patient has been scheduled for a sleep study on Sunday, 01/03/2019. Patient has been informed and advised that he will receive additional information about the sleep study in the mail. Patient verbalized understanding. No further questions.

## 2018-11-19 DIAGNOSIS — R69 Illness, unspecified: Secondary | ICD-10-CM | POA: Diagnosis not present

## 2018-12-01 ENCOUNTER — Other Ambulatory Visit: Payer: Self-pay | Admitting: Cardiology

## 2018-12-01 DIAGNOSIS — I480 Paroxysmal atrial fibrillation: Secondary | ICD-10-CM | POA: Diagnosis not present

## 2018-12-01 NOTE — Telephone Encounter (Signed)
Approved refill sent in for [facility] to [facility].

## 2018-12-03 ENCOUNTER — Other Ambulatory Visit: Payer: Self-pay | Admitting: Cardiology

## 2018-12-03 DIAGNOSIS — H353221 Exudative age-related macular degeneration, left eye, with active choroidal neovascularization: Secondary | ICD-10-CM | POA: Diagnosis not present

## 2018-12-03 DIAGNOSIS — H43813 Vitreous degeneration, bilateral: Secondary | ICD-10-CM | POA: Diagnosis not present

## 2018-12-03 DIAGNOSIS — H353122 Nonexudative age-related macular degeneration, left eye, intermediate dry stage: Secondary | ICD-10-CM | POA: Diagnosis not present

## 2018-12-07 ENCOUNTER — Ambulatory Visit: Payer: Self-pay | Admitting: Cardiology

## 2018-12-23 ENCOUNTER — Other Ambulatory Visit: Payer: Self-pay | Admitting: *Deleted

## 2018-12-23 MED ORDER — HYDROCHLOROTHIAZIDE 25 MG PO TABS: 12.5000 mg | ORAL_TABLET | Freq: Every day | ORAL | 1 refills | Status: DC

## 2019-01-03 ENCOUNTER — Encounter (HOSPITAL_BASED_OUTPATIENT_CLINIC_OR_DEPARTMENT_OTHER): Payer: Self-pay

## 2019-01-14 DIAGNOSIS — H353122 Nonexudative age-related macular degeneration, left eye, intermediate dry stage: Secondary | ICD-10-CM | POA: Diagnosis not present

## 2019-01-14 DIAGNOSIS — H35372 Puckering of macula, left eye: Secondary | ICD-10-CM | POA: Diagnosis not present

## 2019-01-14 DIAGNOSIS — H353221 Exudative age-related macular degeneration, left eye, with active choroidal neovascularization: Secondary | ICD-10-CM | POA: Diagnosis not present

## 2019-02-11 ENCOUNTER — Encounter (HOSPITAL_BASED_OUTPATIENT_CLINIC_OR_DEPARTMENT_OTHER): Payer: Self-pay

## 2019-02-16 DIAGNOSIS — N529 Male erectile dysfunction, unspecified: Secondary | ICD-10-CM | POA: Diagnosis not present

## 2019-02-16 DIAGNOSIS — R32 Unspecified urinary incontinence: Secondary | ICD-10-CM | POA: Diagnosis not present

## 2019-02-16 DIAGNOSIS — I4891 Unspecified atrial fibrillation: Secondary | ICD-10-CM | POA: Diagnosis not present

## 2019-02-16 DIAGNOSIS — H3530 Unspecified macular degeneration: Secondary | ICD-10-CM | POA: Diagnosis not present

## 2019-02-16 DIAGNOSIS — Z7901 Long term (current) use of anticoagulants: Secondary | ICD-10-CM | POA: Diagnosis not present

## 2019-02-16 DIAGNOSIS — Z809 Family history of malignant neoplasm, unspecified: Secondary | ICD-10-CM | POA: Diagnosis not present

## 2019-02-16 DIAGNOSIS — R69 Illness, unspecified: Secondary | ICD-10-CM | POA: Diagnosis not present

## 2019-02-16 DIAGNOSIS — I10 Essential (primary) hypertension: Secondary | ICD-10-CM | POA: Diagnosis not present

## 2019-02-16 DIAGNOSIS — E785 Hyperlipidemia, unspecified: Secondary | ICD-10-CM | POA: Diagnosis not present

## 2019-02-16 DIAGNOSIS — J309 Allergic rhinitis, unspecified: Secondary | ICD-10-CM | POA: Diagnosis not present

## 2019-02-25 DIAGNOSIS — H2513 Age-related nuclear cataract, bilateral: Secondary | ICD-10-CM | POA: Diagnosis not present

## 2019-02-25 DIAGNOSIS — H353221 Exudative age-related macular degeneration, left eye, with active choroidal neovascularization: Secondary | ICD-10-CM | POA: Diagnosis not present

## 2019-02-25 DIAGNOSIS — H4322 Crystalline deposits in vitreous body, left eye: Secondary | ICD-10-CM | POA: Diagnosis not present

## 2019-02-25 DIAGNOSIS — H52203 Unspecified astigmatism, bilateral: Secondary | ICD-10-CM | POA: Diagnosis not present

## 2019-03-03 ENCOUNTER — Telehealth: Payer: Self-pay

## 2019-03-03 NOTE — Telephone Encounter (Signed)
Called patient concerning his bradycardia. He is asymptomatic with no dizziness,syncope, light headness or SOB. 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 Dr.RRR advise relayed with no further questions. Rn informed patient to call office if any new symptoms occur.

## 2019-03-16 ENCOUNTER — Other Ambulatory Visit: Payer: Self-pay | Admitting: Family Medicine

## 2019-03-29 ENCOUNTER — Other Ambulatory Visit (HOSPITAL_COMMUNITY)
Admission: RE | Admit: 2019-03-29 | Discharge: 2019-03-29 | Disposition: A | Discharge status: Home / Self Care | Payer: Medicare HMO | Source: Ambulatory Visit | Attending: Cardiovascular Disease | Admitting: Cardiovascular Disease

## 2019-03-29 ENCOUNTER — Other Ambulatory Visit (HOSPITAL_COMMUNITY): Payer: Medicare HMO

## 2019-03-29 DIAGNOSIS — Z1159 Encounter for screening for other viral diseases: Secondary | ICD-10-CM | POA: Diagnosis not present

## 2019-03-29 LAB — SARS CORONAVIRUS 2 (TAT 6-24 HRS): SARS Coronavirus 2: NEGATIVE

## 2019-03-31 ENCOUNTER — Other Ambulatory Visit: Payer: Self-pay

## 2019-03-31 ENCOUNTER — Other Ambulatory Visit: Payer: Self-pay | Admitting: Cardiology

## 2019-03-31 ENCOUNTER — Ambulatory Visit (HOSPITAL_BASED_OUTPATIENT_CLINIC_OR_DEPARTMENT_OTHER): Payer: Medicare HMO | Attending: Cardiology | Admitting: Cardiovascular Disease

## 2019-03-31 DIAGNOSIS — G4730 Sleep apnea, unspecified: Secondary | ICD-10-CM | POA: Diagnosis not present

## 2019-03-31 DIAGNOSIS — G478 Other sleep disorders: Secondary | ICD-10-CM | POA: Diagnosis not present

## 2019-03-31 DIAGNOSIS — Z79899 Other long term (current) drug therapy: Secondary | ICD-10-CM | POA: Diagnosis not present

## 2019-03-31 DIAGNOSIS — I480 Paroxysmal atrial fibrillation: Secondary | ICD-10-CM | POA: Insufficient documentation

## 2019-03-31 DIAGNOSIS — Z7901 Long term (current) use of anticoagulants: Secondary | ICD-10-CM | POA: Diagnosis not present

## 2019-03-31 DIAGNOSIS — R0902 Hypoxemia: Secondary | ICD-10-CM | POA: Insufficient documentation

## 2019-03-31 DIAGNOSIS — R0683 Snoring: Secondary | ICD-10-CM | POA: Diagnosis not present

## 2019-03-31 NOTE — Telephone Encounter (Signed)
[facility]ill [facility]

## 2019-04-08 ENCOUNTER — Telehealth: Payer: Self-pay | Admitting: Cardiology

## 2019-04-08 ENCOUNTER — Telehealth: Payer: Self-pay | Admitting: Internal Medicine

## 2019-04-08 DIAGNOSIS — H353221 Exudative age-related macular degeneration, left eye, with active choroidal neovascularization: Secondary | ICD-10-CM | POA: Diagnosis not present

## 2019-04-08 DIAGNOSIS — H353112 Nonexudative age-related macular degeneration, right eye, intermediate dry stage: Secondary | ICD-10-CM | POA: Diagnosis not present

## 2019-04-08 DIAGNOSIS — H43813 Vitreous degeneration, bilateral: Secondary | ICD-10-CM | POA: Diagnosis not present

## 2019-04-08 DIAGNOSIS — H35372 Puckering of macula, left eye: Secondary | ICD-10-CM | POA: Diagnosis not present

## 2019-04-08 NOTE — Telephone Encounter (Signed)
Please call with results

## 2019-04-08 NOTE — Telephone Encounter (Signed)
Call returned to patient, requesting results of sleep study. I made this patient aware Dr. Lennox Pippins is the ordering physician so he needed to contact them. Voiced understanding. Nothing further is needed at this time.

## 2019-04-10 ENCOUNTER — Encounter: Payer: Self-pay | Admitting: Family Medicine

## 2019-04-12 ENCOUNTER — Encounter (HOSPITAL_BASED_OUTPATIENT_CLINIC_OR_DEPARTMENT_OTHER): Payer: Self-pay | Admitting: Cardiovascular Disease

## 2019-04-12 NOTE — Procedures (Signed)
Patient Name: Walter Brown, Walter Brown Date: 03/31/2019 Gender: Male D.O.B: [birth year] Age (years): 34 Referring Provider: Reita Cliche Revankar Height (inches): 65 Interpreting Physician: Shelva Majestic MD, ABSM Weight (lbs): 161 RPSGT: Gwenyth Allegra BMI: 27 MRN: [id number] Neck Size: 14.50  CLINICAL INFORMATION Sleep Study Type: NPSG  Indication for sleep study: Hypertension  Epworth Sleepiness Score: 3  SLEEP STUDY TECHNIQUE As per the AASM Manual for the Scoring of Sleep and Associated Events v2.3 (April 2016) with a hypopnea requiring 4% desaturations.  The channels recorded and monitored were frontal, central and occipital EEG, electrooculogram (EOG), submentalis EMG (chin), nasal and oral airflow, thoracic and abdominal wall motion, anterior tibialis EMG, snore microphone, electrocardiogram, and pulse oximetry.  MEDICATIONS     amiodarone (PACERONE) 200 MG tablet             ELIQUIS 5 MG TABS tablet         hydrochlorothiazide (HYDRODIURIL) 25 MG tablet         Multiple Vitamins-Minerals (OCUVITE ADULT 50+ PO)         rosuvastatin (CRESTOR) 10 MG tablet (Expired)         sertraline (ZOLOFT) 25 MG tablet         terazosin (HYTRIN) 10 MG capsule      Medications self-administered by patient taken the night of the study : N/A  SLEEP ARCHITECTURE The study was initiated at 10:35:00 PM and ended at 4:43:29 AM.  Sleep onset time was 71.4 minutes and the sleep efficiency was 54.5%%. The total sleep time was 201 minutes.  Stage REM latency was 11.5 minutes.  The patient spent 5.5%% of the night in stage N1 sleep, 67.4%% in stage N2 sleep, 0.0%% in stage N3 and 27.1% in REM.  Alpha intrusion was absent.  Supine sleep was 0.00%.  RESPIRATORY PARAMETERS The overall apnea/hypopnea index (AHI) was 2.7/h. The respiratory disturbance index (RDI) was 10.1/h. There were 0 total apneas, including 0 obstructive, 0 central and 0 mixed apneas. There were 9 hypopneas and 25 RERAs.   The AHI during Stage REM sleep was 9.9 per hour.  AHI while supine was N/A per hour.  The mean oxygen saturation was 93.5%. The minimum SpO2 during sleep was 90.0%.  Soft snoring was noted during this study.  CARDIAC DATA The 2 lead EKG demonstrated sinus rhythm. The mean heart rate was 52.4 beats per minute. Other EKG findings include: Atrial Fibrillation.  LEG MOVEMENT DATA The total PLMS were 0 with a resulting PLMS index of 0.0. Associated arousal with leg movement index was 3.9 .  IMPRESSIONS - Increased upper airway resistance syndrome overall (AHI  2.7/h; but RDI 10.1/h) with mild sleep apnea during REM sleep (AHI 9.9/h). - No significant central sleep apnea occurred during this study (CAI = 0.0/h). - The patient had minimal or no oxygen desaturation during the study (Min O2 = 90.0%) - The patient snored with soft snoring volume. - EKG findings include Atrial Fibrillation. - Clinically significant periodic limb movements did not occur during sleep. No significant associated arousals.  DIAGNOSIS - Sleep Apnea, unspecified  G47.30 - Nocturnal Hypoxemia (327.26 [G47.36 ICD-10])  RECOMMENDATIONS - Effort should be made to optimize nasal and oropharyngeal.  - At present patient may not need CPAP therapy, but consider re-evaluation in future if symptoms progress.  - Consider initial alternatives to CPAP such as customized oral appliance if necessary. - Avoid alcohol, sedatives and other CNS depressants that may worsen sleep apnea and disrupt normal sleep architecture. -  Sleep hygiene should be reviewed to assess factors that may improve sleep quality. - Weight management and regular exercise should be initiated or continued if appropriate.  [Electronically signed] 04/12/2019 09:00 AM  Shelva Majestic MD, [facility], [place], American Board of Sleep Medicine   NPI: [id number] [place] PH: [phone]   FX: [phone] [place]

## 2019-04-16 ENCOUNTER — Telehealth: Payer: Self-pay | Admitting: Cardiology

## 2019-04-16 NOTE — Telephone Encounter (Signed)
Wants sleep study results

## 2019-04-19 NOTE — Telephone Encounter (Signed)
Telephone call to patent. Informed Dr. Geraldo Pitter has not reviewed his sleep study yet but will call as soon as he does.

## 2019-04-20 ENCOUNTER — Encounter: Payer: Self-pay | Admitting: Family Medicine

## 2019-04-20 NOTE — Telephone Encounter (Signed)
Telephone call to patient.Walter Brown given results of sleep study and mailed copy to patient per his request.

## 2019-04-22 ENCOUNTER — Telehealth: Payer: Self-pay | Admitting: *Deleted

## 2019-04-22 NOTE — Telephone Encounter (Signed)
-----   Message from Troy Sine, MD sent at 04/12/2019  9:06 AM EDT ----- Mariann Laster please notify pt of the results of the study

## 2019-04-22 NOTE — Telephone Encounter (Signed)
Left sleep study results and recommendations on voice mail. Ok per [employer].

## 2019-05-13 ENCOUNTER — Encounter: Payer: Self-pay | Admitting: Family Medicine

## 2019-05-13 ENCOUNTER — Other Ambulatory Visit: Payer: Self-pay | Admitting: Family Medicine

## 2019-05-13 DIAGNOSIS — R251 Tremor, unspecified: Secondary | ICD-10-CM

## 2019-05-13 NOTE — Telephone Encounter (Signed)
Dr. Leonie Man is his neurologist.  Do you want to go ahead and send referral?

## 2019-05-18 DIAGNOSIS — R69 Illness, unspecified: Secondary | ICD-10-CM | POA: Diagnosis not present

## 2019-05-19 DIAGNOSIS — R69 Illness, unspecified: Secondary | ICD-10-CM | POA: Diagnosis not present

## 2019-05-19 DIAGNOSIS — G20A1 Parkinson's disease without dyskinesia, without mention of fluctuations: Secondary | ICD-10-CM

## 2019-05-19 DIAGNOSIS — G20 Parkinson's disease: Secondary | ICD-10-CM | POA: Insufficient documentation

## 2019-05-19 HISTORY — DX: Parkinson's disease: G20

## 2019-05-19 HISTORY — DX: Parkinson's disease without dyskinesia, without mention of fluctuations: G20.A1

## 2019-05-22 ENCOUNTER — Other Ambulatory Visit: Payer: Self-pay | Admitting: Cardiology

## 2019-05-22 ENCOUNTER — Encounter: Payer: Self-pay | Admitting: Family Medicine

## 2019-05-24 ENCOUNTER — Other Ambulatory Visit: Payer: Self-pay | Admitting: Family Medicine

## 2019-05-24 MED ORDER — SERTRALINE HCL 25 MG PO TABS: 12.5000 mg | ORAL_TABLET | Freq: Every day | ORAL | 1 refills | Status: DC

## 2019-05-24 NOTE — Telephone Encounter (Signed)
Medication Refill - Medication: sertraline (ZOLOFT) 25 MG tablet Pt would like this Rx to be continued   Has the patient contacted their pharmacy? No. (Agent: If no, request that the patient contact the pharmacy for the refill.) (Agent: If yes, when and what did the pharmacy advise?)  Preferred Pharmacy (with phone number or street name):  [id number] [place], [place] [phone] (Phone) [phone] (Fax)     Agent: Please be advised that RX refills may take up to 3 business days. We ask that you follow-up with your pharmacy.

## 2019-05-24 NOTE — Telephone Encounter (Signed)
Rx sent 

## 2019-06-03 ENCOUNTER — Encounter: Payer: Self-pay | Admitting: Neurology

## 2019-06-03 ENCOUNTER — Telehealth: Payer: Self-pay | Admitting: Neurology

## 2019-06-03 ENCOUNTER — Ambulatory Visit: Payer: Medicare HMO | Admitting: Neurology

## 2019-06-03 ENCOUNTER — Other Ambulatory Visit: Payer: Self-pay

## 2019-06-03 VITALS — BP 124/74 | HR 68 | Temp 97.8°F | Wt 165.6 lb

## 2019-06-03 DIAGNOSIS — R251 Tremor, unspecified: Secondary | ICD-10-CM | POA: Diagnosis not present

## 2019-06-03 DIAGNOSIS — H353112 Nonexudative age-related macular degeneration, right eye, intermediate dry stage: Secondary | ICD-10-CM | POA: Diagnosis not present

## 2019-06-03 DIAGNOSIS — H353221 Exudative age-related macular degeneration, left eye, with active choroidal neovascularization: Secondary | ICD-10-CM | POA: Diagnosis not present

## 2019-06-03 DIAGNOSIS — H35372 Puckering of macula, left eye: Secondary | ICD-10-CM | POA: Diagnosis not present

## 2019-06-03 DIAGNOSIS — H43813 Vitreous degeneration, bilateral: Secondary | ICD-10-CM | POA: Diagnosis not present

## 2019-06-03 NOTE — Telephone Encounter (Signed)
[employer] medicare order sent to [facility]. They will obtain the auth and reach out to the patient to schedule.  °

## 2019-06-03 NOTE — Patient Instructions (Signed)
I had a long discussion with the patient and his wife regarding his new onset of right hand tremor and discussed differential diagnosis and plan for evaluation and treatment and answered questions.  The tremor is not functionally disabling at the present time and hence will hold off on medications.  He does have some mild early parkinsonian features but does not have functionally disabling symptoms at the present time to justify a trial of medications.  I recommend we check amiodarone level, TSH, B12 and MRI scan of the brain with and without contrast.  He was advised to call me if his tremor gets worse for a trial of medications.  He will return for follow-up in 2 months or call earlier if necessary.

## 2019-06-03 NOTE — Progress Notes (Signed)
[facility] [address] [place]. [place] [address] [phone]       OFFICE CONSULT NOTE  [name] Date of Birth:  04/07/1941 Medical Record Number:  [id number]   Referring MD: Willette Alma  Reason for Referral: Tremors  HPI: [name] is a pleasant 78 year old Caucasian male seen today for initial consultation visit for tremors.  He is accompanied by his wife.  He states he is noticed new onset of tremors in his right hand for the last 6 to 8 weeks.  He notices tremor when his hand is hanging by his side.  The tremors may also be noticed at times when he is brushing his teeth.  The tremor interestingly is absent when he sitting quietly.  He is able to hold a cup of tea or coffee and cut his food without significant bother.  He is noticed that his writing may also be slightly off.  The patient is worried about Parkinson's disease since his dad had Parkinson's.  Patient on inquiry admits to noticing that he takes him a little harder to get out of bed though he can get up easily.  He states his balance is also poor but he has had no falls or injuries.  He denies any drooling of saliva.  He states he sleeps very well.  He does not have hallucinations delusions or nightmares.  He states he is independent in all activities of daily living.  His memory is good.  He does take amiodarone for A. fib but is on only 200 mg a day and levels checked 4 months ago were in the normal range.  He has had no recent medication changes.  Patient had a transient episode of vision disturbance and altered consciousness possibly a posterior circulation TIA versus complex partial seizure episode in May 2019 for which she saw me.  At that time EEG was unremarkable and MRI scan of the brain on 02/28/2018 was also normal.  He wore external cardiac monitor for 2 weeks which did not show significant arrhythmia. He states he has not had any further recurrent episodes. ROS:   14 system review of  systems is positive for tremors bradykinesia, stooped posture all other systems negative PMH:  Past Medical History:  Diagnosis Date  . Anemia    mild  . Arthritis 04/06/2017  . Asthma    childhood  . Basal cell carcinoma    skin- on nose- basal cell (20 yrs ago) forehead 1 year ago  . BPH (benign prostatic hyperplasia)   . Cerumen impaction 08/10/2012  . Chicken pox as child  . Depression 08/01/2012  . [place] measles as a child  . Hyperlipidemia   . Hypertension   . Macular degeneration   . Medicare annual wellness visit, subsequent 11/07/2013  . Otitis externa 08/10/2012  . PAF (paroxysmal atrial fibrillation) ([place])   . Preventative health care 08/01/2012  . Sleep apnea   . Stroke ([place])   . Thrombocytopenia ([place])     Social History:  Social History   Socioeconomic History  . Marital status: Married    Spouse name: Not on file  . Number of children: 2  . Years of education: Not on file  . Highest education level: Not on file  Occupational History  . Occupation: retired  Scientific laboratory technician  . Financial resource strain: Not on file  . Food insecurity    Worry: Not on file    Inability: Not on file  . Transportation needs  Medical: Not on file    Non-medical: Not on file  Tobacco Use  . Smoking status: Former Smoker    Packs/day: 1.50    Years: 15.00    Pack years: 22.50    Start date: 10/07/1978  . Smokeless tobacco: Never Used  Substance and Sexual Activity  . Alcohol use: Yes    Comment: 2 glasses of wine daily  . Drug use: No  . 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  Lifestyle  . Physical activity    Days per week: Not on file    Minutes per session: Not on file  . Stress: Not on file  Relationships  . Social Herbalist on phone: Not on file    Gets together: Not on file    Attends religious service: Not on file    Active member of club or  organization: Not on file    Attends meetings of clubs or organizations: Not on file    Relationship status: Not on file  . Intimate partner violence    Fear of current or ex partner: Not on file    Emotionally abused: Not on file    Physically abused: Not on file    Forced sexual activity: Not on file  Other Topics Concern  . Not on file  Social History Narrative  . Not on file    Medications:   Current Outpatient Medications on File Prior to Visit  Medication Sig Dispense Refill  . amiodarone (PACERONE) 200 MG tablet TAKE 1 TABLET BY MOUTH EVERY DAY 90 tablet 0  . Aspirin Buf,CaCarb-MgCarb-MgO, 81 MG TABS Take by mouth.    . clopidogrel (PLAVIX) 75 MG tablet Take 75 mg by mouth daily.    Marland Kitchen ELIQUIS 5 MG TABS tablet TAKE 1 TABLET BY MOUTH TWICE A DAY 60 tablet 6  . hydrochlorothiazide (HYDRODIURIL) 25 MG tablet TAKE 1 TABLET BY MOUTH EVERY DAY 90 tablet 1  . Multiple Vitamins-Minerals (OCUVITE ADULT 50+ PO) Take 1 tablet by mouth daily.    . rosuvastatin (CRESTOR) 10 MG tablet TAKE 1/2 TABLET BY MOUTH DAILY 45 tablet 2  . sertraline (ZOLOFT) 25 MG tablet Take 0.5 tablets (12.5 mg total) by mouth daily. 45 tablet 1  . terazosin (HYTRIN) 10 MG capsule Take 20 mg by mouth at bedtime.      No current facility-administered medications on file prior to visit.     Allergies:   Allergies  Allergen Reactions  . Albuterol Sulfate Palpitations  . Azithromycin Other (See Comments)    Hepatotoxicity  . Doxycycline Hyclate Other (See Comments)    Taken with Azithromycin and had Heaptotoxicity    Physical Exam General: well developed, well nourished elderly Caucasian male, seated, in no evident distress Head: head normocephalic and atraumatic.   Neck: supple with no carotid or supraclavicular bruits Cardiovascular: regular rate and rhythm, no murmurs Musculoskeletal: no deformity Skin:  no rash/petichiae Vascular:  Normal pulses all extremities  Neurologic Exam Mental Status: Awake  and fully alert. Oriented to place and time. Recent and remote memory intact. Attention span, concentration and fund of knowledge appropriate. Mood and affect appropriate.  Glabellar tap is positive.  But he has good facial expressions. Cranial Nerves: Fundoscopic exam reveals sharp disc margins. Pupils equal, briskly reactive to light. Extraocular movements full without nystagmus. Visual fields full to confrontation. Hearing intact. Facial sensation intact. Face, tongue, palate moves  normally and symmetrically.  Motor: Normal bulk and tone. Normal strength in all tested extremity muscles.  No resting or action tremor noted.  Patient was able to draw spiral, joint 2 points to make a straight line and write a sentence without significant tremulousness.  No no bradykinesia.  Slight diminished right arm swing when he walks.  Posture is good.  No festination or stooped posture.  No retropulsion.  Good partial response to threat.  Rapid alternating movements are slightly diminished on the right compared to the left with some fatigability Sensory.: intact to touch , pinprick , position and vibratory sensation.  Coordination: Rapid alternating movements normal in all extremities. Finger-to-nose and heel-to-shin performed accurately bilaterally. Gait and Station: Arises from chair without difficulty. Stance is normal. Gait demonstrates normal stride length and balance but slight diminished right arm swing while walking. Able to heel, toe and tandem walk without difficulty.  Reflexes: 1+ and symmetric. Toes downgoing.       ASSESSMENT: 78 year old Caucasian male with new onset of right upper extremity tremor mostly with action with some history suggestive of possible early Parkinson's disease but the symptoms appear to be nondisabling at present    PLAN: I had a long discussion with the patient and his wife regarding his new onset of right hand tremor and discussed differential diagnosis and plan for  evaluation and treatment and answered questions.  The tremor is not functionally disabling at the present time and hence will hold off on medications.  He does have some mild early parkinsonian features but does not have functionally disabling symptoms at the present time to justify a trial of medications.  I recommend we check amiodarone level, TSH, B12 and MRI scan of the brain with and without contrast.  He was advised to call me if his tremor gets worse for a trial of medications.  Greater than 50% time during this 45-minute consultation visit was spent on counseling and coordination of care about his tremors and answering questions he will return for follow-up in 2 months or call earlier if necessary. Antony Contras, MD  [facility] [address] [place], [place] [address]  Phone [phone] Fax [phone] Note: This document was prepared with digital dictation and possible smart phrase technology. Any transcriptional errors that result from this process are unintentional.

## 2019-06-07 ENCOUNTER — Telehealth: Payer: Self-pay | Admitting: *Deleted

## 2019-06-07 DIAGNOSIS — N529 Male erectile dysfunction, unspecified: Secondary | ICD-10-CM | POA: Diagnosis not present

## 2019-06-07 DIAGNOSIS — N401 Enlarged prostate with lower urinary tract symptoms: Secondary | ICD-10-CM | POA: Diagnosis not present

## 2019-06-07 DIAGNOSIS — R3914 Feeling of incomplete bladder emptying: Secondary | ICD-10-CM | POA: Diagnosis not present

## 2019-06-07 NOTE — Telephone Encounter (Signed)
-----   Message from Garvin Fila, MD sent at 06/04/2019  5:41 PM EDT ----- Walter Brown inform patient that thyroid hormone and amiodarone levels were normal. Vitamin B 12 is not back yet

## 2019-06-07 NOTE — Telephone Encounter (Signed)
Left patient a detailed message, with results, on voicemail (ok per DPR).  Provided our number to call back with any questions.  

## 2019-06-09 NOTE — Telephone Encounter (Signed)
Dr. Ander Slade please see below email from patient and advise if this can be ordered. Thank you!   "I am having a MRI  on my brain with and without contrast.  Cold you request a chest scan as well to see if there has been any changes in my lungs.  It is scheduled for September 25 at [facility].  Thanks,  [employer]"

## 2019-06-10 LAB — TSH: TSH: 0.754 u[IU]/mL (ref 0.450–4.500)

## 2019-06-10 LAB — AMIODARONE LEVEL
Amiodarone, Serum: 0.7 ug/mL — ABNORMAL LOW (ref 1.0–2.5)
Noramiodarone,S: 0.5 ug/mL — ABNORMAL LOW (ref 1.0–2.5)

## 2019-06-10 LAB — VITAMIN B2, WHOLE BLOOD: Vitamin B2, Whole Blood: 172 ug/L (ref 137–370)

## 2019-06-13 DIAGNOSIS — H6121 Impacted cerumen, right ear: Secondary | ICD-10-CM | POA: Diagnosis not present

## 2019-06-15 ENCOUNTER — Other Ambulatory Visit: Payer: Self-pay | Admitting: Cardiology

## 2019-06-15 NOTE — Telephone Encounter (Signed)
Make sure the patient has not had a stent in the past 1 year.  If that is so then we can stop her aspirin and continue the other 2.

## 2019-06-16 ENCOUNTER — Encounter: Payer: Self-pay | Admitting: Family Medicine

## 2019-06-16 ENCOUNTER — Other Ambulatory Visit: Payer: Self-pay | Admitting: Family Medicine

## 2019-06-16 DIAGNOSIS — R911 Solitary pulmonary nodule: Secondary | ICD-10-CM

## 2019-06-16 NOTE — Progress Notes (Unsigned)
CT

## 2019-06-27 ENCOUNTER — Other Ambulatory Visit: Payer: Medicare HMO

## 2019-07-01 NOTE — Telephone Encounter (Signed)
[employer] medicare Josem KaufmannKH:[id number] (exp. 06/30/19 to 12/27/19) patient is scheduled at GI on 07/02/19.

## 2019-07-02 ENCOUNTER — Ambulatory Visit
Admission: RE | Admit: 2019-07-02 | Discharge: 2019-07-02 | Disposition: A | Discharge status: Home / Self Care | Payer: Medicare HMO | Source: Ambulatory Visit | Attending: Neurology | Admitting: Neurology

## 2019-07-02 ENCOUNTER — Other Ambulatory Visit: Payer: Medicare HMO

## 2019-07-02 DIAGNOSIS — R251 Tremor, unspecified: Secondary | ICD-10-CM

## 2019-07-02 MED ORDER — GADOBENATE DIMEGLUMINE 529 MG/ML IV SOLN
15.0000 mL | Freq: Once | INTRAVENOUS | Status: AC | PRN
  Administered 2019-07-02: 15 mL via INTRAVENOUS

## 2019-07-07 ENCOUNTER — Telehealth: Payer: Self-pay

## 2019-07-07 NOTE — Telephone Encounter (Signed)
I do not see mychart message but inform patient that MRI brain shows no new problems or worrisome abnormalities.  Changes of hardening of the arteries and slight shrinkage of the brain which are to be expected.

## 2019-07-07 NOTE — Telephone Encounter (Signed)
PT sent mychart message about MRI result.

## 2019-07-07 NOTE — Telephone Encounter (Signed)
I called pt and gave him the MRI results per Dr Leonie Man. I stated it showed no new problems or worrisome findings.Only changes of the arteries and slight shrinkage of the brain. PT verbalized understanding.

## 2019-07-19 ENCOUNTER — Other Ambulatory Visit: Payer: Self-pay

## 2019-07-20 ENCOUNTER — Encounter: Payer: Self-pay | Admitting: Family Medicine

## 2019-07-20 ENCOUNTER — Other Ambulatory Visit: Payer: Self-pay | Admitting: Family Medicine

## 2019-07-20 ENCOUNTER — Ambulatory Visit (INDEPENDENT_AMBULATORY_CARE_PROVIDER_SITE_OTHER): Payer: Medicare HMO | Admitting: Family Medicine

## 2019-07-20 VITALS — BP 128/62 | HR 73 | Temp 97.7°F | Resp 18 | Ht 64.5 in | Wt 167.4 lb

## 2019-07-20 DIAGNOSIS — D649 Anemia, unspecified: Secondary | ICD-10-CM | POA: Diagnosis not present

## 2019-07-20 DIAGNOSIS — R251 Tremor, unspecified: Secondary | ICD-10-CM | POA: Diagnosis not present

## 2019-07-20 DIAGNOSIS — E782 Mixed hyperlipidemia: Secondary | ICD-10-CM | POA: Diagnosis not present

## 2019-07-20 DIAGNOSIS — I10 Essential (primary) hypertension: Secondary | ICD-10-CM

## 2019-07-20 DIAGNOSIS — Z0000 Encounter for general adult medical examination without abnormal findings: Secondary | ICD-10-CM | POA: Diagnosis not present

## 2019-07-20 DIAGNOSIS — R002 Palpitations: Secondary | ICD-10-CM | POA: Diagnosis not present

## 2019-07-20 DIAGNOSIS — D696 Thrombocytopenia, unspecified: Secondary | ICD-10-CM | POA: Diagnosis not present

## 2019-07-20 DIAGNOSIS — I480 Paroxysmal atrial fibrillation: Secondary | ICD-10-CM

## 2019-07-20 LAB — LIPID PANEL
Cholesterol: 125 mg/dL (ref 0–200)
HDL: 72.5 mg/dL (ref >39.00)
LDL Cholesterol: 37 mg/dL (ref 0–99)
NonHDL: 52.74
Total CHOL/HDL Ratio: 2
Triglycerides: 79 mg/dL (ref 0.0–149.0)
VLDL: 15.8 mg/dL (ref 0.0–40.0)

## 2019-07-20 LAB — CBC
HCT: 40 % (ref 39.0–52.0)
Hemoglobin: 13.5 g/dL (ref 13.0–17.0)
MCHC: 33.9 g/dL (ref 30.0–36.0)
MCV: 95.9 fl (ref 78.0–100.0)
Platelets: 134 10*3/uL — ABNORMAL LOW (ref 150.0–400.0)
RBC: 4.16 Mil/uL — ABNORMAL LOW (ref 4.22–5.81)
RDW: 14.2 % (ref 11.5–15.5)
WBC: 8 10*3/uL (ref 4.0–10.5)

## 2019-07-20 LAB — COMPREHENSIVE METABOLIC PANEL
ALT: 18 U/L (ref 0–53)
AST: 18 U/L (ref 0–37)
Albumin: 4.1 g/dL (ref 3.5–5.2)
Alkaline Phosphatase: 52 U/L (ref 39–117)
BUN: 17 mg/dL (ref 6–23)
CO2: 30 mEq/L (ref 19–32)
Calcium: 8.9 mg/dL (ref 8.4–10.5)
Chloride: 103 mEq/L (ref 96–112)
Creatinine, Ser: 0.99 mg/dL (ref 0.40–1.50)
GFR: 73.06 mL/min (ref >60.00)
Glucose, Bld: 73 mg/dL (ref 70–99)
Potassium: 3.5 mEq/L (ref 3.5–5.1)
Sodium: 140 mEq/L (ref 135–145)
Total Bilirubin: 0.7 mg/dL (ref 0.2–1.2)
Total Protein: 6 g/dL (ref 6.0–8.3)

## 2019-07-20 LAB — TSH: TSH: 0.89 u[IU]/mL (ref 0.35–4.50)

## 2019-07-20 MED ORDER — SERTRALINE HCL 25 MG PO TABS: 25.0000 mg | ORAL_TABLET | Freq: Every day | ORAL | 1 refills | Status: DC

## 2019-07-20 NOTE — Patient Instructions (Addendum)
Omron blood pressure cuff, upper arm  Pulse oximeter, want oxygen in the 90s  Weekly vitals   DASH or MIND diet  Multivitamin with minerals with Selenium, zinc, Vitamin D and Vitamin C   Learn something new  Call Dr Onnie Graham pulmonology for appt   Preventive Care 32 Years and Older, Male Preventive care refers to lifestyle choices and visits with your health care provider that can promote health and wellness. This includes:  A yearly physical exam. This is also called an annual well check.  Regular dental and eye exams.  Immunizations.  Screening for certain conditions.  Healthy lifestyle choices, such as diet and exercise. What can I expect for my preventive care visit? Physical exam Your health care provider will check:  Height and weight. These may be used to calculate body mass index (BMI), which is a measurement that tells if you are at a healthy weight.  Heart rate and blood pressure.  Your skin for abnormal spots. Counseling Your health care provider may ask you questions about:  Alcohol, tobacco, and drug use.  Emotional well-being.  Home and relationship well-being.  Sexual activity.  Eating habits.  History of falls.  Memory and ability to understand (cognition).  Work and work Statistician. What immunizations do I need?  Influenza (flu) vaccine  This is recommended every year. Tetanus, diphtheria, and pertussis (Tdap) vaccine  You may need a Td booster every 10 years. Varicella (chickenpox) vaccine  You may need this vaccine if you have not already been vaccinated. Zoster (shingles) vaccine  You may need this after age 43. Pneumococcal conjugate (PCV13) vaccine  One dose is recommended after age 25. Pneumococcal polysaccharide (PPSV23) vaccine  One dose is recommended after age 62. Measles, mumps, and rubella (MMR) vaccine  You may need at least one dose of MMR if you were born in 1957 or later. You may also need a second dose.  Meningococcal conjugate (MenACWY) vaccine  You may need this if you have certain conditions. Hepatitis A vaccine  You may need this if you have certain conditions or if you travel or work in places where you may be exposed to hepatitis A. Hepatitis B vaccine  You may need this if you have certain conditions or if you travel or work in places where you may be exposed to hepatitis B. Haemophilus influenzae type b (Hib) vaccine  You may need this if you have certain conditions. You may receive vaccines as individual doses or as more than one vaccine together in one shot (combination vaccines). Talk with your health care provider about the risks and benefits of combination vaccines. What tests do I need? Blood tests  Lipid and cholesterol levels. These may be checked every 5 years, or more frequently depending on your overall health.  Hepatitis C test.  Hepatitis B test. Screening  Lung cancer screening. You may have this screening every year starting at age 52 if you have a 30-pack-year history of smoking and currently smoke or have quit within the past 15 years.  Colorectal cancer screening. All adults should have this screening starting at age 101 and continuing until age 30. Your health care provider may recommend screening at age 47 if you are at increased risk. You will have tests every 1-10 years, depending on your results and the type of screening test.  Prostate cancer screening. Recommendations will vary depending on your family history and other risks.  Diabetes screening. This is done by checking your blood sugar (glucose) after you  a while (fasting). You may have this done every 1-3 years.  Abdominal aortic aneurysm (AAA) screening. You may need this if you are a current or former smoker.  Sexually transmitted disease (STD) testing. Follow these instructions at home: Eating and drinking  Eat a diet that includes fresh fruits and vegetables, whole grains, lean  protein, and low-fat dairy products. Limit your intake of foods with high amounts of sugar, saturated fats, and salt.  Take vitamin and mineral supplements as recommended by your health care provider.  Do not drink alcohol if your health care provider tells you not to drink.  If you drink alcohol: ? Limit how much you have to 0-2 drinks a day. ? Be aware of how much alcohol is in your drink. In the [place] one drink equals one 12 oz bottle of beer (355 mL), one 5 oz glass of wine (148 mL), or one 1 oz glass of hard liquor (44 mL). Lifestyle  Take daily care of your teeth and gums.  Stay active. Exercise for at least 30 minutes on 5 or more days each week.  Do not use any products that contain nicotine or tobacco, such as cigarettes, e-cigarettes, and chewing tobacco. If you need help quitting, ask your health care provider.  If you are sexually active, practice safe sex. Use a condom or other form of protection to prevent STIs (sexually transmitted infections).  Talk with your health care provider about taking a low-dose aspirin or statin. What's next?  Visit your health care provider once a year for a well check visit.  Ask your health care provider how often you should have your eyes and teeth checked.  Stay up to date on all vaccines. This information is not intended to replace advice given to you by your health care provider. Make sure you discuss any questions you have with your health care provider. Document Released: 10/20/2015 Document Revised: 09/17/2018 Document Reviewed: 09/17/2018 Elsevier Patient Education  2020 [employer]  

## 2019-07-20 NOTE — Progress Notes (Signed)
Subjective:    Patient ID: Walter Brown, male    DOB: 07-03-1941, 78 y.o.   MRN: [id number]  No chief complaint on file.   HPI Patient is in today for annual preventative exam and follow up on chronic medical concerns including hyperlipidemia, tremor, PAF, Hypertension and more. He is feeling well today. No recent febrile illness or hospitalizations. He is now on Eliquis and is tolerating it. No sign of bleeding. No palpiations or acute concerns. His right had tremor is worsening and he is now following with neurology and believes he will be diagnosed with Parkinson's as his father was. He is maintaining quarantine well. He has been exercising and eating well and is happy with his intentional weight loss. Denies CP/palp/SOB/HA/congestion/fevers/GI or GU c/o. Taking meds as prescribed  Past Medical History:  Diagnosis Date  . Anemia    mild  . Arthritis 04/06/2017  . Asthma    childhood  . Basal cell carcinoma    skin- on nose- basal cell (20 yrs ago) forehead 1 year ago  . BPH (benign prostatic hyperplasia)   . Cerumen impaction 08/10/2012  . Chicken pox as child  . Depression 08/01/2012  . [place] measles as a child  . Hyperlipidemia   . Hypertension   . Macular degeneration   . Medicare annual wellness visit, subsequent 11/07/2013  . Otitis externa 08/10/2012  . PAF (paroxysmal atrial fibrillation) ([place])   . Preventative health care 08/01/2012  . Sleep apnea   . Stroke ([place])   . Thrombocytopenia ([place])     Past Surgical History:  Procedure Laterality Date  . [place]     very young, b/l  . EXCISIONAL HEMORRHOIDECTOMY    . HYDROCELE EXCISION / REPAIR  2012   b/l  . SKIN CANCER EXCISION     nose and forehead    Family History  Problem Relation Age of Onset  . Hypertension Mother   . Other Mother        MRSA  . Heart disease Mother        stents  . Parkinsonism Father   . Heart disease Father   . Hypertension Father   . Prostate cancer Father 37   . Prostate cancer Maternal Grandfather   . ADD / ADHD Son        ADHD  . Other Son 24       part of 1 lung removed- due to infection  . Stroke Maternal Grandmother   . Cancer Paternal Grandfather        EYE    Social History   Socioeconomic History  . Marital status: Married    Spouse name: Not on file  . Number of children: 2  . Years of education: Not on file  . Highest education level: Not on file  Occupational History  . Occupation: retired  Scientific laboratory technician  . Financial resource strain: Not on file  . Food insecurity    Worry: Not on file    Inability: Not on file  . Transportation needs    Medical: Not on file    Non-medical: Not on file  Tobacco Use  . Smoking status: Former Smoker    Packs/day: 1.50    Years: 15.00    Pack years: 22.50    Start date: 10/07/1978  . Smokeless tobacco: Never Used  Substance and Sexual Activity  . Alcohol use: Yes    Comment: 2 glasses of wine daily  . Drug use: No  .  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  Lifestyle  . Physical activity    Days per week: Not on file    Minutes per session: Not on file  . Stress: Not on file  Relationships  . Social Herbalist on phone: Not on file    Gets together: Not on file    Attends religious service: Not on file    Active member of club or organization: Not on file    Attends meetings of clubs or organizations: Not on file    Relationship status: Not on file  . Intimate partner violence    Fear of current or ex partner: Not on file    Emotionally abused: Not on file    Physically abused: Not on file    Forced sexual activity: Not on file  Other Topics Concern  . Not on file  Social History Narrative  . Not on file    Outpatient Medications Prior to Visit  Medication Sig Dispense Refill  . amiodarone (PACERONE) 200 MG tablet TAKE 1 TABLET BY MOUTH EVERY DAY 90 tablet 0   . ELIQUIS 5 MG TABS tablet TAKE 1 TABLET BY MOUTH TWICE A DAY 60 tablet 6  . hydrochlorothiazide (HYDRODIURIL) 25 MG tablet TAKE 1 TABLET BY MOUTH EVERY DAY 90 tablet 1  . rosuvastatin (CRESTOR) 10 MG tablet TAKE 1/2 TABLET BY MOUTH DAILY 45 tablet 2  . terazosin (HYTRIN) 10 MG capsule Take 20 mg by mouth at bedtime.     . sertraline (ZOLOFT) 25 MG tablet Take 0.5 tablets (12.5 mg total) by mouth daily. 45 tablet 1  . Aspirin Buf,CaCarb-MgCarb-MgO, 81 MG TABS Take by mouth.    . clopidogrel (PLAVIX) 75 MG tablet Take 75 mg by mouth daily.    . Multiple Vitamins-Minerals (OCUVITE ADULT 50+ PO) Take 1 tablet by mouth daily.     No facility-administered medications prior to visit.     Allergies  Allergen Reactions  . Albuterol Sulfate Palpitations  . Azithromycin Other (See Comments)    Hepatotoxicity  . Doxycycline Hyclate Other (See Comments)    Taken with Azithromycin and had Heaptotoxicity    Review of Systems  Constitutional: Positive for malaise/fatigue. Negative for chills and fever.  HENT: Negative for congestion and hearing loss.   Eyes: Negative for discharge.  Respiratory: Negative for cough, sputum production and shortness of breath.   Cardiovascular: Negative for chest pain, palpitations and leg swelling.  Gastrointestinal: Negative for abdominal pain, blood in stool, constipation, diarrhea, heartburn, nausea and vomiting.  Genitourinary: Negative for dysuria, frequency, hematuria and urgency.  Musculoskeletal: Negative for back pain, falls and myalgias.  Skin: Negative for rash.  Neurological: Positive for tremors. Negative for dizziness, sensory change, loss of consciousness, weakness and headaches.  Endo/Heme/Allergies: Negative for environmental allergies. Does not bruise/bleed easily.  Psychiatric/Behavioral: Negative for depression and suicidal ideas. The patient is not nervous/anxious and does not have insomnia.        Objective:    Physical Exam Vitals signs  and nursing note reviewed.  Constitutional:      General: He is not in acute distress.    Appearance: Normal appearance. He is well-developed. He is not ill-appearing.  HENT:     Head: Normocephalic and atraumatic.     Right Ear: Tympanic membrane and external ear normal.     Left Ear: Tympanic membrane and external ear normal.  Nose: Nose normal.  Eyes:     General:        Right eye: No discharge.        Left eye: No discharge.     Extraocular Movements: Extraocular movements intact.     Pupils: Pupils are equal, round, and reactive to light.  Neck:     Musculoskeletal: Normal range of motion and neck supple.  Cardiovascular:     Rate and Rhythm: Normal rate and regular rhythm.     Heart sounds: No murmur.  Pulmonary:     Effort: Pulmonary effort is normal.     Breath sounds: Normal breath sounds. No wheezing.  Abdominal:     General: Bowel sounds are normal.     Palpations: Abdomen is soft. There is no mass.     Tenderness: There is no abdominal tenderness.  Musculoskeletal: Normal range of motion.  Skin:    General: Skin is warm and dry.  Neurological:     Mental Status: He is alert and oriented to person, place, and time.     Deep Tendon Reflexes: Reflexes normal.     Comments: Right hand tremor     BP 128/62 (BP Location: Left Arm, Patient Position: Sitting, Cuff Size: Normal)   Pulse 73   Temp 97.7 F (36.5 C) (Temporal)   Resp 18   Ht 5' 4.5" (1.638 m)   Wt 167 lb 6.4 oz (75.9 kg)   SpO2 98%   BMI 28.29 kg/m  Wt Readings from Last 3 Encounters:  07/20/19 167 lb 6.4 oz (75.9 kg)  06/03/19 165 lb 9.6 oz (75.1 kg)  03/31/19 161 lb (73 kg)    Diabetic Foot Exam - Simple   No data filed     Lab Results  Component Value Date   WBC 8.0 07/20/2019   HGB 13.5 07/20/2019   HCT 40.0 07/20/2019   PLT 134.0 (L) 07/20/2019   GLUCOSE 73 07/20/2019   CHOL 125 07/20/2019   TRIG 79.0 07/20/2019   HDL 72.50 07/20/2019   LDLCALC 37 07/20/2019   ALT 18  07/20/2019   AST 18 07/20/2019   NA 140 07/20/2019   K 3.5 07/20/2019   CL 103 07/20/2019   CREATININE 0.99 07/20/2019   BUN 17 07/20/2019   CO2 30 07/20/2019   TSH 0.89 07/20/2019   INR 1.0 12/15/2017    Lab Results  Component Value Date   TSH 0.89 07/20/2019   Lab Results  Component Value Date   WBC 8.0 07/20/2019   HGB 13.5 07/20/2019   HCT 40.0 07/20/2019   MCV 95.9 07/20/2019   PLT 134.0 (L) 07/20/2019   Lab Results  Component Value Date   NA 140 07/20/2019   K 3.5 07/20/2019   CO2 30 07/20/2019   GLUCOSE 73 07/20/2019   BUN 17 07/20/2019   CREATININE 0.99 07/20/2019   BILITOT 0.7 07/20/2019   ALKPHOS 52 07/20/2019   AST 18 07/20/2019   ALT 18 07/20/2019   PROT 6.0 07/20/2019   ALBUMIN 4.1 07/20/2019   CALCIUM 8.9 07/20/2019   ANIONGAP 12 08/27/2018   GFR 73.06 07/20/2019   Lab Results  Component Value Date   CHOL 125 07/20/2019   Lab Results  Component Value Date   HDL 72.50 07/20/2019   Lab Results  Component Value Date   LDLCALC 37 07/20/2019   Lab Results  Component Value Date   TRIG 79.0 07/20/2019   Lab Results  Component Value Date   CHOLHDL 2 07/20/2019  No results found for: HGBA1C     Assessment & Plan:   Problem List Items Addressed This Visit    Thrombocytopenia ([place])    Mild, asymptomatic, will monitor      Anemia   Relevant Orders   CBC (Completed)   Hyperlipidemia - Primary    Encouraged heart healthy diet, increase exercise, avoid trans fats, consider a krill oil cap daily      Relevant Orders   Lipid panel (Completed)   Hypertension    Well controlled, no changes to meds. Encouraged heart healthy diet such as the DASH diet and exercise as tolerated.       Relevant Orders   CBC (Completed)   Comprehensive metabolic panel (Completed)   TSH (Completed)   Preventative health care    Patient encouraged to maintain heart healthy diet, regular exercise, adequate sleep. Consider daily probiotics. Take medications  as prescribed. Labs ordered and reviewed.       Palpitations   Relevant Orders   TSH (Completed)   PAF (paroxysmal atrial fibrillation) ([place])    Is following with cardiology and is now and Eliquis and tolerating it well. No recent episodes that were symptomatic.       Tremor    He is following with neurology, Dr Leonie Man and is concerned that he hs developed Parkinson's which his father also had. He is expecting to be given the diagnosis at his follow up visit.         I have discontinued Eann O. Comins's Multiple Vitamins-Minerals (OCUVITE ADULT 50+ PO), Aspirin Buf(CaCarb-MgCarb-MgO), and clopidogrel. I am also having him maintain his terazosin, hydrochlorothiazide, amiodarone, rosuvastatin, and Eliquis.  No orders of the defined types were placed in this encounter.    Penni Homans, MD

## 2019-07-21 DIAGNOSIS — Z23 Encounter for immunization: Secondary | ICD-10-CM | POA: Diagnosis not present

## 2019-07-21 DIAGNOSIS — Z85828 Personal history of other malignant neoplasm of skin: Secondary | ICD-10-CM | POA: Diagnosis not present

## 2019-07-21 DIAGNOSIS — L820 Inflamed seborrheic keratosis: Secondary | ICD-10-CM | POA: Diagnosis not present

## 2019-07-21 DIAGNOSIS — D225 Melanocytic nevi of trunk: Secondary | ICD-10-CM | POA: Diagnosis not present

## 2019-07-21 DIAGNOSIS — L570 Actinic keratosis: Secondary | ICD-10-CM | POA: Diagnosis not present

## 2019-07-21 DIAGNOSIS — L814 Other melanin hyperpigmentation: Secondary | ICD-10-CM | POA: Diagnosis not present

## 2019-07-21 DIAGNOSIS — L821 Other seborrheic keratosis: Secondary | ICD-10-CM | POA: Diagnosis not present

## 2019-07-21 DIAGNOSIS — R251 Tremor, unspecified: Secondary | ICD-10-CM | POA: Insufficient documentation

## 2019-07-21 DIAGNOSIS — Z86018 Personal history of other benign neoplasm: Secondary | ICD-10-CM | POA: Diagnosis not present

## 2019-07-21 NOTE — Assessment & Plan Note (Signed)
Encouraged heart healthy diet, increase exercise, avoid trans fats, consider a krill oil cap daily 

## 2019-07-21 NOTE — Assessment & Plan Note (Signed)
Patient encouraged to maintain heart healthy diet, regular exercise, adequate sleep. Consider daily probiotics. Take medications as prescribed. Labs ordered and reviewed 

## 2019-07-21 NOTE — Assessment & Plan Note (Signed)
Mild, asymptomatic, will monitor

## 2019-07-21 NOTE — Assessment & Plan Note (Signed)
He is following with neurology, Dr Leonie Man and is concerned that he hs developed Parkinson's which his father also had. He is expecting to be given the diagnosis at his follow up visit.

## 2019-07-21 NOTE — Assessment & Plan Note (Signed)
Is following with cardiology and is now and Eliquis and tolerating it well. No recent episodes that were symptomatic.

## 2019-07-21 NOTE — Assessment & Plan Note (Signed)
Well controlled, no changes to meds. Encouraged heart healthy diet such as the DASH diet and exercise as tolerated.  °

## 2019-07-22 DIAGNOSIS — H43813 Vitreous degeneration, bilateral: Secondary | ICD-10-CM | POA: Diagnosis not present

## 2019-07-22 DIAGNOSIS — H353112 Nonexudative age-related macular degeneration, right eye, intermediate dry stage: Secondary | ICD-10-CM | POA: Diagnosis not present

## 2019-07-22 DIAGNOSIS — H353221 Exudative age-related macular degeneration, left eye, with active choroidal neovascularization: Secondary | ICD-10-CM | POA: Diagnosis not present

## 2019-07-22 DIAGNOSIS — H35372 Puckering of macula, left eye: Secondary | ICD-10-CM | POA: Diagnosis not present

## 2019-07-26 ENCOUNTER — Other Ambulatory Visit: Payer: Self-pay

## 2019-07-26 ENCOUNTER — Ambulatory Visit: Payer: Medicare HMO | Admitting: Neurology

## 2019-07-26 ENCOUNTER — Encounter: Payer: Self-pay | Admitting: Neurology

## 2019-07-26 VITALS — BP 131/72 | HR 69 | Temp 97.4°F | Wt 169.6 lb

## 2019-07-26 DIAGNOSIS — G20 Parkinson's disease: Secondary | ICD-10-CM

## 2019-07-26 DIAGNOSIS — G20C Parkinsonism, unspecified: Secondary | ICD-10-CM

## 2019-07-26 NOTE — Progress Notes (Signed)
[facility] [address] [place]. [place] [address] [phone]       OFFICE FOLLOW UP VISIT  [name] Date of Birth:  [birth year] Medical Record Number:  [id number]   Referring MD: Willette Alma  Reason for Referral: Tremors  [id number] Consult 06/03/2019 : Walter Brown is a pleasant 78 year old Caucasian male seen today for initial consultation visit for tremors.  He is accompanied by his wife.  He states he is noticed new onset of tremors in his right hand for the last 6 to 8 weeks.  He notices tremor when his hand is hanging by his side.  The tremors may also be noticed at times when he is brushing his teeth.  The tremor interestingly is absent when he sitting quietly.  He is able to hold a cup of tea or coffee and cut his food without significant bother.  He is noticed that his writing may also be slightly off.  The patient is worried about Parkinson's disease since his dad had Parkinson's.  Patient on inquiry admits to noticing that he takes him a little harder to get out of bed though he can get up easily.  He states his balance is also poor but he has had no falls or injuries.  He denies any drooling of saliva.  He states he sleeps very well.  He does not have hallucinations delusions or nightmares.  He states he is independent in all activities of daily living.  His memory is good.  He does take amiodarone for A. fib but is on only 200 mg a day and levels checked 4 months ago were in the normal range.  He has had no recent medication changes.  Patient had a transient episode of vision disturbance and altered consciousness possibly a posterior circulation TIA versus complex partial seizure episode in May 2019 for which she saw me.  At that time EEG was unremarkable and MRI scan of the brain on 02/28/2018 was also normal.  He wore external cardiac monitor for 2 weeks which did not show significant arrhythmia. He states he has not had any further recurrent  episodes. Update 07/26/2019 : He returns for follow-up after last visit 6 weeks ago.  He states he continues to have intermittent right upper extremity tremor which is noted mostly with activities like walking or brushing his teeth.  He still feels tremors are quite mild and not interfering with activities of daily living and he does not want to try medications at the moment.  He denies any bradykinesia, increased drooling of saliva, falls or gait imbalance.  He does notice that he is slightly off balance and has to be careful when he gets up or makes a sudden turn.  He had lab work done on 06/03/2019 which showed normal amiodarone level, TSH, vitamin B12.  He also had comprehensive metabolic panel CBC and TSH on 07/20/2019 which were normal.  Lipid panel was also unremarkable.  He had MRI scan of the brain done on 07/02/2019 which showed mild age-related changes of chronic microvascular disease and generalized atrophy.  No acute abnormalities were noted.  Patient has read up about Parkinson's disease and has lost questions about it. ROS:   14 system review of systems is positive for tremors bradykinesia, stooped posture all other systems negative PMH:  Past Medical History:  Diagnosis Date   Anemia    mild   Arthritis 04/06/2017   Asthma    childhood   Basal cell carcinoma  skin- on nose- basal cell (20 yrs ago) forehead 1 year ago   BPH (benign prostatic hyperplasia)    Cerumen impaction 08/10/2012   Chicken pox as child   Depression 08/01/2012   [place] measles as a child   Hyperlipidemia    Hypertension    Macular degeneration    Medicare annual wellness visit, subsequent 11/07/2013   Otitis externa 08/10/2012   PAF (paroxysmal atrial fibrillation) ([place])    Preventative health care 08/01/2012   Sleep apnea    Stroke ([place])    Thrombocytopenia ([facility])     Social History:  Social History   Socioeconomic History   Marital status: Married    Spouse name: Not on file    Number of children: 2   Years of education: Not on file   Highest education level: Not on file  Occupational History   Occupation: retired  Scientist, product/process development strain: Not on file   Food insecurity    Worry: Not on file    Inability: Not on Lexicographer needs    Medical: Not on file    Non-medical: Not on file  Tobacco Use   Smoking status: Former Smoker    Packs/day: 1.50    Years: 15.00    Pack years: 22.50    Start date: 10/07/1978   Smokeless tobacco: Never Used  Substance and Sexual Activity   Alcohol use: Yes    Comment: 2 glasses of wine daily   Drug use: No   Sexual activity: Yes    Comment: lives with wife, 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  Lifestyle   Physical activity    Days per week: Not on file    Minutes per session: Not on file   Stress: Not on file  Relationships   Social connections    Talks on phone: Not on file    Gets together: Not on file    Attends religious service: Not on file    Active member of club or organization: Not on file    Attends meetings of clubs or organizations: Not on file    Relationship status: Not on file   Intimate partner violence    Fear of current or ex partner: Not on file    Emotionally abused: Not on file    Physically abused: Not on file    Forced sexual activity: Not on file  Other Topics Concern   Not on file  Social History Narrative   Not on file    Medications:   Current Outpatient Medications on File Prior to Visit  Medication Sig Dispense Refill   amiodarone (PACERONE) 200 MG tablet TAKE 1 TABLET BY MOUTH EVERY DAY 90 tablet 0   ELIQUIS 5 MG TABS tablet TAKE 1 TABLET BY MOUTH TWICE A DAY 60 tablet 6   hydrochlorothiazide (HYDRODIURIL) 25 MG tablet TAKE 1 TABLET BY MOUTH EVERY DAY 90 tablet 1   rosuvastatin (CRESTOR) 10 MG tablet TAKE 1/2 TABLET BY MOUTH DAILY 45 tablet 2   sertraline (ZOLOFT) 25 MG tablet  Take 1 tablet (25 mg total) by mouth daily. 90 tablet 1   terazosin (HYTRIN) 10 MG capsule Take 20 mg by mouth at bedtime.      No current facility-administered medications on file prior to visit.     Allergies:   Allergies  Allergen Reactions   Albuterol Sulfate Palpitations   Azithromycin Other (See Comments)    Hepatotoxicity  Doxycycline Hyclate Other (See Comments)    Taken with Azithromycin and had Heaptotoxicity    Physical Exam General: well developed, well nourished elderly Caucasian male, seated, in no evident distress Head: head normocephalic and atraumatic.   Neck: supple with no carotid or supraclavicular bruits Cardiovascular: regular rate and rhythm, no murmurs Musculoskeletal: no deformity Skin:  no rash/petichiae Vascular:  Normal pulses all extremities  Neurologic Exam Mental Status: Awake and fully alert. Oriented to place and time. Recent and remote memory intact. Attention span, concentration and fund of knowledge appropriate. Mood and affect appropriate.  Glabellar tap is positive.  But he has good facial expressions. Cranial Nerves: Fundoscopic exam reveals sharp disc margins. Pupils equal, briskly reactive to light. Extraocular movements full without nystagmus. Visual fields full to confrontation. Hearing intact. Facial sensation intact. Face, tongue, palate moves normally and symmetrically.  Motor: Normal bulk and tone. Normal strength in all tested extremity muscles.  No resting or action tremor noted.  Patient was able to draw spiral, joint 2 points to make a straight line and write a sentence without significant tremulousness.  No no bradykinesia.  Slight diminished right arm swing when he walks.  Posture is good.  No festination or stooped posture.  No retropulsion.  Good partial response to threat.  Rapid alternating movements are slightly diminished on the right compared to the left with some fatigability he was able to draw spiral with both hands  without significant tremors.  He was able to write a sentence without micrographia. Sensory.: intact to touch , pinprick , position and vibratory sensation.  Coordination: Rapid alternating movements normal in all extremities. Finger-to-nose and heel-to-shin performed accurately bilaterally. Gait and Station: Arises from chair without difficulty. Stance is normal. Gait demonstrates normal stride length and balance but slight diminished right arm swing while walking. Able to heel, toe and tandem walk without difficulty.  Reflexes: 1+ and symmetric. Toes downgoing.       ASSESSMENT: 78 year old Caucasian male with new onset of right upper extremity tremor mostly with action with some history suggestive of possible early Parkinson's disease but the symptoms continue to be nondisabling at present    PLAN: I had a long discussion with the patient regarding his mild right upper extremity tremor which is likely early parkinsonian.  We discussed the risk benefit of starting Sinemet but the patient feels his tremor is not functionally disabling at the present time and he would like to hold off on starting medicines.  I have asked him to call me if his symptoms get worse and he changes his mind.  He was advised to exercise regularly and return for follow-up in 6 months or call earlier if necessary.Greater than 50% time during this 25-minute  visit was spent on counseling and coordination of care about his tremors and answering questions  . Antony Contras, MD  [facility] [address] [place], [place] [address]  Phone [phone] Fax [phone] Note: This document was prepared with digital dictation and possible smart phrase technology. Any transcriptional errors that result from this process are unintentional.

## 2019-07-26 NOTE — Patient Instructions (Signed)
I had a long discussion with the patient regarding his mild right upper extremity tremor which is likely early parkinsonian.  We discussed the risk benefit of starting Sinemet but the patient feels his tremor is not functionally disabling at the present time and he would like to hold off on starting medicines.  I have asked him to call me if his symptoms get worse and he changes his mind.  He was advised to exercise regularly and return for follow-up in 6 months or call earlier if necessary.   Parkinson's Disease Parkinson's disease causes problems with movements. It is a long-term condition. It gets worse over time (is progressive). It affects each person in different ways. It makes it harder for you to:  Control how your body moves.  Move your body normally. The condition can range from mild to very bad (advanced). What are the causes? This condition results from a loss of brain cells called neurons. These brain cells make a chemical called dopamine, which is needed to control body movement. As the condition gets worse, the brain cells make less dopamine. This makes it hard to move or control your movements. The exact cause of this condition is not known. What increases the risk?  Being male.  Being age 96 or older.  Having family members who had Parkinson's disease.  Having had an injury to the brain.  Being very sad (depressed).  Being around things that are harmful or poisonous. What are the signs or symptoms? Symptoms of this condition can vary. The main symptoms have to do with movement. These include:  A tremor or shaking while you are resting that you cannot control.  Stiffness in your neck, arms, and legs.  Slowing of movement. This may include: ? Losing expressions of the face. ? Having trouble making small movements that are needed to button your clothing or brush your teeth.  Walking in a way that is not normal. You may walk with short, shuffling steps.  Loss of  balance when standing. You may sway, fall backward, or have trouble making turns. Other symptoms include:  Being very sad, worried, or confused.  Seeing or hearing things that are not real.  Losing thinking abilities (dementia).  Trouble speaking or swallowing.  Having a hard time pooping (constipation).  Needing to pee right away, peeing often, or not being able to control when you pee or poop.  Sleep problems. How is this treated? There is no cure. The goal of treatment is to manage your symptoms. Treatment may include:  Medicines.  Therapy to help with talking or movement.  Surgery to reduce shaking and other movements that you cannot control. Follow these instructions at home: Medicines  Take over-the-counter and prescription medicines only as told by your doctor.  Avoid taking pain or sleeping medicines. Eating and drinking  Follow instructions from your doctor about what you cannot eat or drink.  Do not drink alcohol. Activity  Talk with your doctor about if it is safe for you to drive.  Do exercises as told by your doctor. Lifestyle      Put in grab bars and railings in your home. These help to prevent falls.  Do not use any products that contain nicotine or tobacco, such as cigarettes, e-cigarettes, and chewing tobacco. If you need help quitting, ask your doctor.  Join a support group. General instructions  Talk with your doctor about what you need help with and what your safety needs are.  Keep all follow-up visits  as told by your doctor, including any therapy visits to help with talking or moving. This is important. Contact a doctor if:  Medicines do not help your symptoms.  You feel off-balance.  You fall at home.  You need more help at home.  You have trouble swallowing.  You have a very hard time pooping.  You have a lot of side effects from your medicines.  You feel very sad, worried, or confused. Get help right away if:  You  were hurt in a fall.  You see or hear things that are not real.  You cannot swallow without choking.  You have chest pain or trouble breathing.  You do not feel safe at home.  You have thoughts about hurting yourself or others. If you ever feel like you may hurt yourself or others, or have thoughts about taking your own life, get help right away. You can go to your nearest emergency department or call:  Your local emergency services (911 in the [place]  A suicide crisis helpline, such as the [place] at (704) 833-5188. This is open 24 hours a day. Summary  This condition causes problems with movements.  It is a long-term condition. It gets worse over time.  There is no cure. Treatment focuses on managing your symptoms.  Talk with your doctor about what you need help with and what your safety needs are.  Keep all follow-up visits as told by your doctor. This is important. This information is not intended to replace advice given to you by your health care provider. Make sure you discuss any questions you have with your health care provider. Document Released: 12/16/2011 Document Revised: 12/10/2018 Document Reviewed: 12/10/2018 Elsevier Patient Education  2020 [employer].

## 2019-08-15 ENCOUNTER — Encounter: Payer: Self-pay | Admitting: Family Medicine

## 2019-08-16 ENCOUNTER — Other Ambulatory Visit: Payer: Self-pay | Admitting: Family Medicine

## 2019-08-16 DIAGNOSIS — R251 Tremor, unspecified: Secondary | ICD-10-CM

## 2019-08-16 NOTE — Progress Notes (Unsigned)
amb ref °

## 2019-08-23 ENCOUNTER — Ambulatory Visit (INDEPENDENT_AMBULATORY_CARE_PROVIDER_SITE_OTHER): Payer: Medicare HMO | Admitting: Cardiology

## 2019-08-23 ENCOUNTER — Other Ambulatory Visit: Payer: Self-pay

## 2019-08-23 ENCOUNTER — Encounter: Payer: Self-pay | Admitting: Cardiology

## 2019-08-23 VITALS — BP 142/78 | HR 56 | Ht 66.0 in | Wt 167.0 lb

## 2019-08-23 DIAGNOSIS — G459 Transient cerebral ischemic attack, unspecified: Secondary | ICD-10-CM | POA: Diagnosis not present

## 2019-08-23 DIAGNOSIS — I10 Essential (primary) hypertension: Secondary | ICD-10-CM | POA: Diagnosis not present

## 2019-08-23 DIAGNOSIS — I480 Paroxysmal atrial fibrillation: Secondary | ICD-10-CM

## 2019-08-23 MED ORDER — AMIODARONE HCL 100 MG PO TABS: 100.0000 mg | ORAL_TABLET | Freq: Every day | ORAL | 2 refills | Status: DC

## 2019-08-23 NOTE — Patient Instructions (Signed)
Medication Instructions:  Your physician has recommended you make the following change in your medication:   DECREASE amiodarone to 100 mg (1 tablet) once daily *If you need a refill on your cardiac medications before your next appointment, please call your pharmacy*  Lab Work: Your physician recommends that you have a BMP and hepatic drawn today  If you have labs (blood work) drawn today and your tests are completely normal, you will receive your results only by: Marland Kitchen MyChart Message (if you have MyChart) OR . A paper copy in the mail If you have any lab test that is abnormal or we need to change your treatment, we will call you to review the results.  Testing/Procedures: You had an EKG performed today  Follow-Up: At [facility], you and your health needs are our priority.  As part of our continuing mission to provide you with exceptional heart care, we have created designated Provider Care Teams.  These Care Teams include your primary Cardiologist (physician) and Advanced Practice Providers (APPs -  Physician Assistants and Nurse Practitioners) who all work together to provide you with the care you need, when you need it.  Your next appointment:   3 months  The format for your next appointment:   In Person  Provider:   Jyl Heinz, MD

## 2019-08-23 NOTE — Addendum Note (Signed)
Addended by: Beckey Rutter on: 08/23/2019 01:44 PM   Modules accepted: Orders

## 2019-08-23 NOTE — Progress Notes (Signed)
Cardiology Office Note:    Date:  08/23/2019   ID:  Walter Brown, DOB October 26, 1940, MRN [id number]  PCP:  [name], MD  Cardiologist:  Jenean Lindau, MD   Referring MD: [name], MD    ASSESSMENT:    1. PAF (paroxysmal atrial fibrillation) ([place])   2. TIA (transient ischemic attack)   3. Essential hypertension    PLAN:    In order of problems listed above:  1. Paroxysmal atrial fibrillation: I discussed with the patient atrial fibrillation, disease process. Management and therapy including rate and rhythm control, anticoagulation benefits and potential risks were discussed extensively with the patient. Patient had multiple questions which were answered to patient's satisfaction.  Patient was advised to cut amiodarone to 100 mg daily.  He has an appointment coming with his pulmonologist and also CT scan coming up in December.  I will see him in follow-up appointment in the month of January. 2. Essential hypertension: Blood pressure stable 3. Importance of regular exercise stressed and he verbalizes understanding and promises to be active.   Medication Adjustments/Labs and Tests Ordered: Current medicines are reviewed at length with the patient today.  Concerns regarding medicines are outlined above.  No orders of the defined types were placed in this encounter.  No orders of the defined types were placed in this encounter.    No chief complaint on file.    History of Present Illness:    Walter Brown is a 78 y.o. male.  Patient has past medical history of paroxysmal atrial fibrillation and essential hypertension.  He denies any problems at this time and takes care of activities of daily living.  No chest pain orthopnea or PND.  Past Medical History:  Diagnosis Date  . Anemia    mild  . Arthritis 04/06/2017  . Asthma    childhood  . Basal cell carcinoma    skin- on nose- basal cell (20 yrs ago) forehead 1 year ago  . BPH (benign prostatic hyperplasia)    . Cerumen impaction 08/10/2012  . Chicken pox as child  . Depression 08/01/2012  . [place] measles as a child  . Hyperlipidemia   . Hypertension   . Macular degeneration   . Medicare annual wellness visit, subsequent 11/07/2013  . Otitis externa 08/10/2012  . PAF (paroxysmal atrial fibrillation) ([place])   . Preventative health care 08/01/2012  . Sleep apnea   . Stroke ([place])   . Thrombocytopenia ([place])     Past Surgical History:  Procedure Laterality Date  . [place]     very young, b/l  . EXCISIONAL HEMORRHOIDECTOMY    . HYDROCELE EXCISION / REPAIR  2012   b/l  . SKIN CANCER EXCISION     nose and forehead    Current Medications: Current Meds  Medication Sig  . amiodarone (PACERONE) 200 MG tablet TAKE 1 TABLET BY MOUTH EVERY DAY  . ELIQUIS 5 MG TABS tablet TAKE 1 TABLET BY MOUTH TWICE A DAY  . hydrochlorothiazide (HYDRODIURIL) 25 MG tablet TAKE 1 TABLET BY MOUTH EVERY DAY  . Multiple Vitamins-Minerals (ICAPS AREDS 2 PO) Take by mouth.  . rosuvastatin (CRESTOR) 10 MG tablet TAKE 1/2 TABLET BY MOUTH DAILY  . sertraline (ZOLOFT) 25 MG tablet Take 1 tablet (25 mg total) by mouth daily.  Marland Kitchen terazosin (HYTRIN) 10 MG capsule Take 20 mg by mouth at bedtime.      Allergies:   Albuterol sulfate, Azithromycin, and Doxycycline hyclate   Social History  Socioeconomic History  . Marital status: Married    Spouse name: Not on file  . Number of children: 2  . Years of education: Not on file  . Highest education level: Not on file  Occupational History  . Occupation: retired  Scientific laboratory technician  . Financial resource strain: Not on file  . Food insecurity    Worry: Not on file    Inability: Not on file  . Transportation needs    Medical: Not on file    Non-medical: Not on file  Tobacco Use  . Smoking status: Former Smoker    Packs/day: 1.50    Years: 15.00    Pack years: 22.50    Start date: 10/07/1978  . Smokeless tobacco: Never Used  Substance and Sexual Activity  .  Alcohol use: Yes    Comment: 2 glasses of wine daily  . Drug use: No  . 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  Lifestyle  . Physical activity    Days per week: Not on file    Minutes per session: Not on file  . Stress: Not on file  Relationships  . Social Herbalist on phone: Not on file    Gets together: Not on file    Attends religious service: Not on file    Active member of club or organization: Not on file    Attends meetings of clubs or organizations: Not on file    Relationship status: Not on file  Other Topics Concern  . Not on file  Social History Narrative  . Not on file     Family History: The patient's family history includes ADD / ADHD in his son; Cancer in his paternal grandfather; Heart disease in his father and mother; Hypertension in his father and mother; Other in his mother; Other (age of onset: 26) in his son; Parkinsonism in his father; Prostate cancer in his maternal grandfather; Prostate cancer (age of onset: 14) in his father; Stroke in his maternal grandmother.  ROS:   Please see the history of present illness.    All other systems reviewed and are negative.  EKGs/Labs/Other Studies Reviewed:    The following studies were reviewed today: EKG reveals sinus rhythm and bradycardia nonspecific ST-T changes.   Recent Labs: 07/20/2019: ALT 18; BUN 17; Creatinine, Ser 0.99; Hemoglobin 13.5; Platelets 134.0; Potassium 3.5; Sodium 140; TSH 0.89  Recent Lipid Panel    Component Value Date/Time   CHOL 125 07/20/2019 1433   TRIG 79.0 07/20/2019 1433   HDL 72.50 07/20/2019 1433   CHOLHDL 2 07/20/2019 1433   VLDL 15.8 07/20/2019 1433   LDLCALC 37 07/20/2019 1433    Physical Exam:    VS:  BP (!) 142/78 (BP Location: Left Arm, Patient Position: Sitting, Cuff Size: Normal)   Pulse (!) 56   Ht 5\' 6"  (1.676 m)   Wt 167 lb (75.8 kg)   SpO2 98%    BMI 26.95 kg/m     Wt Readings from Last 3 Encounters:  08/23/19 167 lb (75.8 kg)  07/26/19 169 lb 9.6 oz (76.9 kg)  07/20/19 167 lb 6.4 oz (75.9 kg)     GEN: Patient is in no acute distress HEENT: Normal NECK: No JVD; No carotid bruits LYMPHATICS: No lymphadenopathy CARDIAC: Hear sounds regular, 2/6 systolic murmur at the apex. RESPIRATORY:  Clear to auscultation without rales, wheezing or rhonchi  ABDOMEN: Soft,  non-tender, non-distended MUSCULOSKELETAL:  No edema; No deformity  SKIN: Warm and dry NEUROLOGIC:  Alert and oriented x 3 PSYCHIATRIC:  Normal affect   Signed, Jenean Lindau, MD  08/23/2019 1:25 PM    [facility]

## 2019-08-24 LAB — BASIC METABOLIC PANEL WITH GFR
BUN/Creatinine Ratio: 16 (ref 10–24)
BUN: 16 mg/dL (ref 8–27)
CO2: 24 mmol/L (ref 20–29)
Calcium: 8.9 mg/dL (ref 8.6–10.2)
Chloride: 104 mmol/L (ref 96–106)
Creatinine, Ser: 1.01 mg/dL (ref 0.76–1.27)
GFR calc Af Amer: 82 mL/min/1.73 (ref >59)
GFR calc non Af Amer: 71 mL/min/1.73 (ref >59)
Glucose: 80 mg/dL (ref 65–99)
Potassium: 4 mmol/L (ref 3.5–5.2)
Sodium: 141 mmol/L (ref 134–144)

## 2019-08-24 LAB — HEPATIC FUNCTION PANEL
ALT: 16 IU/L (ref 0–44)
AST: 20 IU/L (ref 0–40)
Albumin: 3.9 g/dL (ref 3.7–4.7)
Alkaline Phosphatase: 67 IU/L (ref 39–117)
Bilirubin Total: 0.5 mg/dL (ref 0.0–1.2)
Bilirubin, Direct: 0.19 mg/dL (ref 0.00–0.40)
Total Protein: 6 g/dL (ref 6.0–8.5)

## 2019-08-25 ENCOUNTER — Encounter: Payer: Self-pay | Admitting: *Deleted

## 2019-09-07 ENCOUNTER — Other Ambulatory Visit: Payer: Self-pay | Admitting: Family Medicine

## 2019-09-09 DIAGNOSIS — H353221 Exudative age-related macular degeneration, left eye, with active choroidal neovascularization: Secondary | ICD-10-CM | POA: Diagnosis not present

## 2019-09-09 DIAGNOSIS — H43813 Vitreous degeneration, bilateral: Secondary | ICD-10-CM | POA: Diagnosis not present

## 2019-09-09 DIAGNOSIS — H35372 Puckering of macula, left eye: Secondary | ICD-10-CM | POA: Diagnosis not present

## 2019-09-09 DIAGNOSIS — H353112 Nonexudative age-related macular degeneration, right eye, intermediate dry stage: Secondary | ICD-10-CM | POA: Diagnosis not present

## 2019-09-15 ENCOUNTER — Telehealth: Payer: Self-pay

## 2019-09-15 NOTE — Telephone Encounter (Signed)
Copied from [place] [id number]. Topic: General - Other >> Sep 15, 2019 10:21 AM Rainey Pines A wrote: [facility] is requesting callback at [phone] in regards to patients Chest CT.  Due to chest ct already being authorized the [place] name needs to be changed to Dr. Charlett Blake instead of Dr. Ander Slade versus cancelling the authorization with insurance and starting a new one. Please advise

## 2019-09-17 NOTE — Telephone Encounter (Signed)
Dr. Ander Slade, please see patient's mychart message and advise on it for pt. Pt had to cancel CT so he is wanting to know if his office visit with you still needs to take place or if it needs to be cancelled until after he has the CT.

## 2019-09-17 NOTE — Telephone Encounter (Signed)
Reschedule appointment for after CT is obtained

## 2019-09-17 NOTE — Progress Notes (Signed)
Walter Brown was seen today in the movement disorders clinic for neurologic consultation at the request of [name], MD.  The consultation is for the evaluation of parkinsonism.  Prior medical records are reviewed available to me..  Patient first saw Dr. Leonie Man on April 21, 2018.  This consultation was for possible TIA versus complex partial seizure.  The patient's symptoms consisted of diplopia while driving (felt that the road lines were splitting into 2).  He felt wobbly when he got out of the car and he sat down.  His speech was mumbled.  He had an extensive work-up that was unremarkable.  Ultimately, it was felt that his symptoms likely represented TIA, and they did not recur again.  He saw Dr. Leonie Man for a new problem on June 03, 2019.  The new problem was the same problem for which he was referred here, that being tremor.  Tremor started in perhaps July, 2020 in the right hand.  Dr. Leonie Man felt that the patient had "mild early parkinsonian features."  A repeat MRI of the brain was ordered and was completed on July 02, 2019.  I had the opportunity to review this.  It was unremarkable.  Patient followed up in July 26, 2019.  No medications were started at that time.   Specific Symptoms:  Tremor: Yes.   , R hand, mostly with ambulation and with brushing teeth.  Nothing in the R leg and nothing on the L side of the body Family hx of similar:  Yes.  , father with PD history Voice: no change Sleep: sleeps well - RR x 2-3   Vivid Dreams:  No.  Acting out dreams:  No. Wet Pillows: Yes.   Postural symptoms:  Yes.    Falls?  No. Bradykinesia symptoms: slow movements and difficulty getting out of a chair (if chair is low) Loss of smell:  No. Loss of taste:  No. Urinary Incontinence:  No. Difficulty Swallowing:  No. Handwriting, micrographia: Yes.   Trouble with ADL's:  No.  Trouble buttoning clothing: No., except cuff buttons are more difficult Depression:  Yes.  , "a  little" Memory changes:  Yes.  , "a little" - not sure if different than others his age Hallucinations:  No.  visual distortions: No. N/V:  No. Lightheaded:  No.  Syncope: No. Diplopia:  No. Dyskinesia:  No. Prior exposure to reglan/antipsychotics: No.   PREVIOUS MEDICATIONS: none to date  ALLERGIES:   Allergies  Allergen Reactions  . Albuterol Sulfate Palpitations  . Azithromycin Other (See Comments)    Hepatotoxicity  . Doxycycline Hyclate Other (See Comments)    Taken with Azithromycin and had Heaptotoxicity    CURRENT MEDICATIONS:  Current Outpatient Medications  Medication Instructions  . amiodarone (PACERONE) 100 mg, Oral, Daily  . ELIQUIS 5 MG TABS tablet TAKE 1 TABLET BY MOUTH TWICE A DAY  . hydrochlorothiazide (HYDRODIURIL) 25 MG tablet TAKE 1 TABLET BY MOUTH EVERY DAY  . Multiple Vitamins-Minerals (ICAPS AREDS 2 PO) Oral  . rosuvastatin (CRESTOR) 10 MG tablet TAKE 1/2 TABLET BY MOUTH DAILY  . sertraline (ZOLOFT) 25 mg, Oral, Daily  . terazosin (HYTRIN) 20 mg, Oral, Daily at bedtime    PAST MEDICAL HISTORY:   Past Medical History:  Diagnosis Date  . Anemia    mild  . Arthritis 04/06/2017  . Asthma    childhood  . Basal cell carcinoma    skin- on nose- basal cell (20 yrs ago) forehead 1 year ago  .  BPH (benign prostatic hyperplasia)   . Cerumen impaction 08/10/2012  . Chicken pox as child  . Depression 08/01/2012  . [place] measles as a child  . Hyperlipidemia   . Hypertension   . Macular degeneration   . Medicare annual wellness visit, subsequent 11/07/2013  . Otitis externa 08/10/2012  . PAF (paroxysmal atrial fibrillation) ([place])   . Preventative health care 08/01/2012  . Sleep apnea   . Stroke ([place])   . Thrombocytopenia ([place])     PAST SURGICAL HISTORY:   Past Surgical History:  Procedure Laterality Date  . Faith     very young, b/l  . EXCISIONAL HEMORRHOIDECTOMY    . HYDROCELE EXCISION / REPAIR  2012   b/l  . SKIN CANCER  EXCISION     nose and forehead, basal cell CA    SOCIAL HISTORY:   Social History   Socioeconomic History  . Marital status: Married    Spouse name: Not on file  . Number of children: 2  . Years of education: Not on file  . Highest education level: Not on file  Occupational History  . Occupation: retired  Tobacco Use  . Smoking status: Former Smoker    Packs/day: 1.50    Years: 15.00    Pack years: 22.50    Start date: 10/07/1978  . Smokeless tobacco: Never Used  Substance and Sexual Activity  . Alcohol use: Yes    Comment: 2 glasses of wine daily  . Drug use: No  . 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  Other Topics Concern  . Not on file  Social History Narrative  . Not on file   Social Determinants of Health   Financial Resource Strain:   . Difficulty of Paying Living Expenses: Not on file  Food Insecurity:   . Worried About Charity fundraiser in the Last Year: Not on file  . Ran Out of Food in the Last Year: Not on file  Transportation Needs:   . Lack of Transportation (Medical): Not on file  . Lack of Transportation (Non-Medical): Not on file  Physical Activity:   . Days of Exercise per Week: Not on file  . Minutes of Exercise per Session: Not on file  Stress:   . Feeling of Stress : Not on file  Social Connections:   . Frequency of Communication with Friends and Family: Not on file  . Frequency of Social Gatherings with Friends and Family: Not on file  . Attends Religious Services: Not on file  . Active Member of Clubs or Organizations: Not on file  . Attends Archivist Meetings: Not on file  . Marital Status: Not on file  Intimate Partner Violence:   . Fear of Current or Ex-Partner: Not on file  . Emotionally Abused: Not on file  . Physically Abused: Not on file  . Sexually Abused: Not on file    FAMILY HISTORY:   Family Status  Relation  Name Status  . Mother  Deceased at age 84  . Father  Deceased at age 32  . MGF  Deceased at age 97  . Son [employer]  . MGM  Deceased at age 63  . PGF  Deceased       5  . Sister  Alive  . Son Cytogeneticist  . PGM  Deceased at age 60  ROS:  Review of Systems  Constitutional: Positive for weight loss (20 lbs in the last year - watching sweets).  HENT: Negative.   Eyes: Negative.   Respiratory: Negative.   Cardiovascular: Negative.   Gastrointestinal: Negative.   Genitourinary: Positive for frequency.  Musculoskeletal: Negative.   Skin: Negative.   Endo/Heme/Allergies: Negative.   Psychiatric/Behavioral: Positive for depression.    PHYSICAL EXAMINATION:    VITALS:   Vitals:   09/21/19 1002  BP: (!) 178/81  Pulse: 72  Resp: 14  SpO2: 95%  Weight: 167 lb 6.4 oz (75.9 kg)  Height: 5' 5" (1.651 m)    GEN:  The patient appears stated age and is in NAD. HEENT:  Normocephalic, atraumatic.  The mucous membranes are moist. The superficial temporal arteries are without ropiness or tenderness. CV:  RRR Lungs:  CTAB Neck/HEME:  There are no carotid bruits bilaterally.  Neurological examination:  Orientation: The patient is alert and oriented x3. Fund of knowledge is appropriate.  Recent and remote memory are intact.  Attention and concentration are normal.    Able to name objects and repeat phrases. Cranial nerves: There is good facial symmetry.  There is ptosis (likely pseudoptosis) from lid lag.  Extraocular muscles are intact. The visual fields are full to confrontational testing. The speech is fluent and clear. Soft palate rises symmetrically and there is no tongue deviation. Hearing is intact to conversational tone. Sensation: Sensation is intact to light and pinprick throughout (facial, trunk, extremities). Vibration is intact at the bilateral big toe. There is no extinction with double simultaneous stimulation. There is no sensory dermatomal level  identified. Motor: Strength is 5/5 in the bilateral upper and lower extremities.   Shoulder shrug is equal and symmetric.  There is no pronator drift. Deep tendon reflexes: Deep tendon reflexes are 1-2-/4 at the bilateral biceps, triceps, brachioradialis, patella and achilles. Plantar responses are downgoing bilaterally.  Movement examination: Tone: There is mild increased tone in the RUE/RLE.  Tone elsewhere is normal Abnormal movements: none seen.  A little tremor is felt with distraction procedures bilaterally in the UE Coordination:  There is mild decremation with RAM's, with any form of RAMS, including alternating supination and pronation of the forearm, hand opening and closing, finger taps, heel taps and toe taps on the right.  RAMs on the L are good Gait and Station: The patient has no difficulty arising out of a deep-seated chair without the use of the hands. The patient's stride length is good with decreased arm swing on the L.    Lab Results  Component Value Date   TSH 0.89 07/20/2019     Chemistry      Component Value Date/Time   NA 141 08/23/2019 1352   K 4.0 08/23/2019 1352   CL 104 08/23/2019 1352   CO2 24 08/23/2019 1352   BUN 16 08/23/2019 1352   CREATININE 1.01 08/23/2019 1352   CREATININE 1.13 02/22/2014 1006      Component Value Date/Time   CALCIUM 8.9 08/23/2019 1352   ALKPHOS 67 08/23/2019 1352   AST 20 08/23/2019 1352   ALT 16 08/23/2019 1352   BILITOT 0.5 08/23/2019 1352       ASSESSMENT/PLAN:  1.  Probable mild Parkinson's disease, likely Hoehn and Yoehr stage I  -I did discuss with the patient that while he likely has mild Parkinson's disease and meets the [place] brain bank criteria, we also discussed that a small percent of patients on amiodarone can have parkinsonism.  I  discussed with him that about one third of patients on amiodarone can have tremor, but it is fairly rare to have the parkinsonism, which is why I think that he has Parkinson's disease  (and his father had Parkinson's disease).  Nonetheless, if he really wanted to separate out the etiology, then a DaTscan would be necessary since he is on amiodarone.  He would like to do that.  We will get that scheduled.  -If the DaTscan is abnormal, which I suspect it will be, we talked about various treatments medication wise.  Right now, he really does not want any medication.  That is a fairly reasonable approach right now.  -We talked about the importance of safe, cardiovascular exercise and exactly what that meant.  -Patient education and literature was provided to the patient today.  -Discussed community resources.  He met with my Education officer, museum today.  -Parkinson's disease does increase risk for melanoma, albeit very slightly.  He already sees dermatology yearly as he has a history of basal cell carcinoma.  2.  Memory change  -I do not think that the patient has a neurodegenerative memory change right now.  We discussed neurocognitive testing, and patient would like to proceed at least for baseline purposes.  We will get that scheduled.    3.  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  We will go ahead and check acetylcholine receptor antibodies just to make sure we are not missing anything.  4.  Follow up is anticipated in the next 4-6 months, sooner should new neurologic issues arise.  Much greater than 50% of this visit was spent in counseling and coordinating care.  Total face to face time:  60 min   Cc:  [name], MD

## 2019-09-17 NOTE — Telephone Encounter (Signed)
Called and spoke with pt due to seeing a CT appt still scheduled when pulled up appt list. Pt said he thought he had received an email that the appt was canceled. I stated to pt to call [facility] to clarify this if he is still on the schedule for the CT. I told him if they did have to cancel his CT that we would cancel his appt with AO but I didn't want to do anything until he had clarification. We are going to keep pt's appts as is due to him still being on the schedule for the CT unless pt calls back otherwise.

## 2019-09-21 ENCOUNTER — Ambulatory Visit (INDEPENDENT_AMBULATORY_CARE_PROVIDER_SITE_OTHER): Payer: Medicare HMO | Admitting: Clinical

## 2019-09-21 ENCOUNTER — Encounter: Payer: Self-pay | Admitting: Neurology

## 2019-09-21 ENCOUNTER — Ambulatory Visit (INDEPENDENT_AMBULATORY_CARE_PROVIDER_SITE_OTHER): Payer: Medicare HMO | Admitting: Neurology

## 2019-09-21 ENCOUNTER — Other Ambulatory Visit: Payer: Medicare HMO

## 2019-09-21 ENCOUNTER — Other Ambulatory Visit: Payer: Self-pay

## 2019-09-21 VITALS — BP 178/81 | HR 72 | Resp 14 | Ht 65.0 in | Wt 167.4 lb

## 2019-09-21 DIAGNOSIS — R251 Tremor, unspecified: Secondary | ICD-10-CM | POA: Diagnosis not present

## 2019-09-21 DIAGNOSIS — H02403 Unspecified ptosis of bilateral eyelids: Secondary | ICD-10-CM | POA: Diagnosis not present

## 2019-09-21 DIAGNOSIS — T887XXA Unspecified adverse effect of drug or medicament, initial encounter: Secondary | ICD-10-CM | POA: Diagnosis not present

## 2019-09-21 DIAGNOSIS — Z719 Counseling, unspecified: Secondary | ICD-10-CM

## 2019-09-21 DIAGNOSIS — G20C Parkinsonism, unspecified: Secondary | ICD-10-CM

## 2019-09-21 DIAGNOSIS — G20 Parkinson's disease: Secondary | ICD-10-CM | POA: Diagnosis not present

## 2019-09-21 NOTE — Progress Notes (Deleted)
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 Wells Guiles Tat. LCSW provided patient education on nonmotor Parkinson's symptoms such as apathy, depression, incontinence/constipation, sleep behavior disorders, communication and cognitive impairment in addition to review and management strategies of motor sx, including medication & exercise.  LCSW provided pt with information about our support and educational groups for patients with Parkinson's as well as their care partners, also discussed the importance of forced intense exercise in the management of PD and provided information about current online exercise opportunities.  Also discussed availability of individual counseling sessions to address the adjustment of living with chronic disease of Parkinson's, invited patient to schedule with LCSW as desired. Pt responded receptively to patient education today. Pt strengths include family support. Pt would likely benefit from engagement with exercise and social opportunities to support management of PD.     Brief Interventions provided today in session 1. psychoeducation, patient education 2. Supportive counseling   Plan 1. Read "Newly Diagnosed"/ FAQs ([place]),  Contact LCSW with any questions related to Parkinson's & behavioral health  2. Goal for exercise following Cardiology clearance, exercise options email sent to pt  Behavioral Health treatment  recommendations communicated to referring provider and pt states agreement with plan. LCSW will remain available for future consultation.

## 2019-09-21 NOTE — Patient Instructions (Signed)
A.  Hold your sertraline 7 days prior to the DaT scan B.  You have been referred for a neurocognitive evaluation in our office.   The evaluation has two parts.   . The first part of the evaluation is a clinical interview with the neuropsychologist (Dr. Melvyn Novas or Dr. Nicole Kindred). Please bring someone with you to this appointment if possible, as it is helpful for the doctor to hear from both you and another adult who knows you well.   . The second part of the evaluation is testing with the doctor's technician Hinton Dyer or Maudie Mercury). The testing includes a variety of tasks- mostly question-and-answer, some paper-and-pencil. There is nothing you need to do to prepare for this appointment, but having a good night's sleep prior to the testing, taking medications as you normally would, and bringing eyeglasses and hearing aids (if you wear them), is advised. Please make sure that you wear a mask to the appointment.  Please note: We have to reserve several hours of the neuropsychologist's time and the psychometrician's time for your evaluation appointment. As such, please note that there is a No-Show fee of $100. If you are unable to attend any of your appointments, please contact our office as soon as possible to reschedule.  C. Here are some resources/books that you may find helpful as you navigate the challenges of Parkinson's Disease  1.  Parkinson's treatement: 10 secrets to a happier life by Andy Gauss, MD 2.  Navigating Life with Parkinsons disease by [place] 3.  My degeneration: A journey through Parkinsons [place] Penner - [place]) 4.  Every Victory counts (I believe this one if free through [employer]) 5.  Lucky Man by Boykin Reaper 6.  101 Questions & Answers about Parkinson's by Donney Dice 7.  Parkinsons Disease Treatment Book by JE Ahiskog

## 2019-09-21 NOTE — BH Specialist Note (Deleted)
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 Wells Guiles Tat. LCSW provided patient education on nonmotor Parkinson's symptoms such as apathy, depression, incontinence/constipation, sleep behavior disorders, communication and cognitive impairment in addition to review and management strategies of motor sx, including medication & exercise.  LCSW provided pt with information about our support and educational groups for patients with Parkinson's as well as their care partners, also discussed the importance of forced intense exercise in the management of PD and provided information about current online exercise opportunities.  Also discussed availability of individual counseling sessions to address the adjustment of living with chronic disease of Parkinson's, invited patient to schedule with LCSW as desired. Pt responded receptively to patient education today. Pt strengths include family support. Pt would likely benefit from engagement with exercise and social opportunities to support management of PD.     Brief Interventions provided today in session 1. psychoeducation, patient education 2. Supportive counseling   Plan 1. Read "Newly Diagnosed"/ FAQs ([place]),  Contact LCSW with any questions related to Parkinson's & behavioral health  2. Goal for exercise following Cardiology clearance, exercise options email sent to pt  Behavioral Health treatment  recommendations communicated to referring provider and pt states agreement with plan. LCSW will remain available for future consultation.

## 2019-09-21 NOTE — BH Specialist Note (Signed)
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 Wells Guiles Tat. LCSW provided patient education on nonmotor Parkinson's symptoms such as apathy, depression, incontinence/constipation, sleep behavior disorders, communication and cognitive impairment in addition to review and management strategies of motor sx, including medication & exercise.  LCSW provided pt with information about our support and educational groups for patients with Parkinson's as well as their care partners, also discussed the importance of forced intense exercise in the management of PD and provided information about current online exercise opportunities.  Also discussed availability of individual counseling sessions to address the adjustment of living with chronic disease of Parkinson's, invited patient to schedule with LCSW as desired. Pt responded receptively to patient education today. Pt strengths include family support. Pt would likely benefit from engagement with exercise and social opportunities to support management of PD.     Brief Interventions provided today in session 1. psychoeducation, patient education 2. Supportive counseling   Plan 1. Read "Newly Diagnosed"/ FAQs ([place]),  Contact LCSW with any questions related to Parkinson's & behavioral health  2. Goal for exercise following Cardiology clearance, exercise options email sent to pt  Behavioral Health treatment  recommendations communicated to referring provider and pt states agreement with plan. LCSW will remain available for future consultation.

## 2019-09-22 ENCOUNTER — Telehealth: Payer: Self-pay | Admitting: Neurology

## 2019-09-22 ENCOUNTER — Other Ambulatory Visit: Payer: Self-pay

## 2019-09-22 ENCOUNTER — Telehealth: Payer: Self-pay | Admitting: Pulmonary Disease

## 2019-09-22 DIAGNOSIS — R251 Tremor, unspecified: Secondary | ICD-10-CM

## 2019-09-22 NOTE — Telephone Encounter (Signed)
Waiting for patients approval via fax so it can be sent to  to schedule

## 2019-09-22 NOTE — Progress Notes (Signed)
Received datscan approval Orders place for scheduling

## 2019-09-22 NOTE — Telephone Encounter (Signed)
The info for this denial should be with dr Ander Slade and his nurse to do a peer to peer Joellen [place]

## 2019-09-22 NOTE — Telephone Encounter (Signed)
Duplicate message. See MyChart encounter

## 2019-09-22 NOTE — Telephone Encounter (Signed)
I received an [facility] rejection of your request for a CAT on my chest.  You should have received a copy also.  Can you request an appeal??  Is my appointment still on for Dec 21??  Message routed to [facility]

## 2019-09-22 NOTE — Telephone Encounter (Signed)
Audree Camel from [facility] called and said this patient's DaT scan is approved from 09/21/19-03/22/20, [id number].

## 2019-09-24 ENCOUNTER — Other Ambulatory Visit: Payer: Self-pay

## 2019-09-24 ENCOUNTER — Ambulatory Visit (HOSPITAL_BASED_OUTPATIENT_CLINIC_OR_DEPARTMENT_OTHER)
Admission: RE | Admit: 2019-09-24 | Discharge: 2019-09-24 | Disposition: A | Discharge status: Home / Self Care | Payer: Medicare HMO | Source: Ambulatory Visit | Attending: Family Medicine | Admitting: Family Medicine

## 2019-09-24 ENCOUNTER — Other Ambulatory Visit (HOSPITAL_BASED_OUTPATIENT_CLINIC_OR_DEPARTMENT_OTHER): Payer: Medicare HMO

## 2019-09-24 DIAGNOSIS — R911 Solitary pulmonary nodule: Secondary | ICD-10-CM | POA: Insufficient documentation

## 2019-09-24 DIAGNOSIS — R918 Other nonspecific abnormal finding of lung field: Secondary | ICD-10-CM | POA: Diagnosis not present

## 2019-09-24 LAB — STRIATED MUSCLE ANTIBODY: STRIATED MUSCLE AB SCREEN: NEGATIVE

## 2019-09-24 LAB — ACETYLCHOLINE RECEPTOR, BINDING: A CHR BINDING ABS: <0.3 nmol/L

## 2019-09-25 ENCOUNTER — Other Ambulatory Visit: Payer: Self-pay | Admitting: Cardiology

## 2019-09-27 ENCOUNTER — Other Ambulatory Visit: Payer: Self-pay

## 2019-09-27 ENCOUNTER — Ambulatory Visit: Payer: Medicare HMO | Admitting: Pulmonary Disease

## 2019-09-27 ENCOUNTER — Encounter: Payer: Self-pay | Admitting: Pulmonary Disease

## 2019-09-27 VITALS — BP 130/74 | HR 72 | Ht 65.0 in | Wt 168.0 lb

## 2019-09-27 DIAGNOSIS — R9389 Abnormal findings on diagnostic imaging of other specified body structures: Secondary | ICD-10-CM | POA: Diagnosis not present

## 2019-09-27 NOTE — Progress Notes (Signed)
Subjective:    Patient ID: Walter Brown, male    DOB: [birth year], 78 y.o.   MRN: [id number] Chief complaint- Abnormal CT scan of the chest showing multiple lung nodules Cough and shortness of breath has resolved  HPI:   Recent diagnosis of Parkinson Denies any significant cough at present Activity level remains good  Cough started about a year ago Symptoms started about a year ago when he was exposed to somebody who had what appeared to be an upper respiratory infection No chest pains or chest discomfort, no weight loss, no hemoptysis Past history of basal cell carcinoma on the nasal bridge Reformed smoker-quit many years ago over 30 years ago  He had a CT scan of the chest showing what appears to be mucus in the airway and atelectasis at the base of the right lung, small nodules in the lung Small lung nodules are stable on CT  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  HENT: Negative for congestion, dental problem, ear pain, nosebleeds, postnasal drip, rhinorrhea, sinus pressure, sneezing, sore throat and trouble swallowing.   Eyes: Negative for redness and itching.  Respiratory: Negative for cough and shortness of breath.   Skin: Negative for rash.  Allergic/Immunologic: Negative.   Neurological:       New diagnosis of Parkinson's   Past Medical History:  Diagnosis Date  . Anemia    mild  . Arthritis 04/06/2017  . Asthma    childhood  . Basal cell carcinoma    skin- on nose- basal cell (20 yrs ago) forehead 1 year ago  . BPH (benign prostatic hyperplasia)   . Cerumen impaction 08/10/2012  . Chicken pox as child  . Depression 08/01/2012  . [place] measles as a child  . Hyperlipidemia   . Hypertension   . Macular degeneration   . Medicare annual wellness visit, subsequent  11/07/2013  . Otitis externa 08/10/2012  . PAF (paroxysmal atrial fibrillation) ([place])   . Preventative health care 08/01/2012  . Sleep apnea   . Stroke ([place])   . Thrombocytopenia ([place])    Past Surgical History:  Procedure Laterality Date  . [place]     very young, b/l  . EXCISIONAL HEMORRHOIDECTOMY    . HYDROCELE EXCISION / REPAIR  2012   b/l  . SKIN CANCER EXCISION     nose and forehead, basal cell CA   .  No significant contributory family history  Reformed smoker No significant history of alcohol use     Objective:   Physical Exam Constitutional:      General: He is not in acute distress.    Appearance: He is well-developed. He is not diaphoretic.  HENT:     Head: Normocephalic and atraumatic.     Mouth/Throat:     Pharynx: No oropharyngeal exudate.  Eyes:     General:        Right eye: No discharge.        Left eye: No discharge.     Pupils: Pupils are equal, round, and reactive to light.  Neck:     Thyroid: No thyromegaly.     Trachea: No tracheal deviation.  Cardiovascular:     Rate and Rhythm: Normal rate and regular rhythm.     Heart sounds: No murmur. No friction rub.  Pulmonary:     Effort:  Pulmonary effort is normal.     Breath sounds: Normal breath sounds.  Musculoskeletal:     Cervical back: Normal range of motion and neck supple.  Neurological:     Mental Status: He is alert.    CT scan was reviewed by myself with the patient Compared with recent abdominal CT performed in February 2019 CT scan was reviewed-2019-09-24     Assessment & Plan:.  .  Chronic cough -Cough has resolved  .  Abnormal CT scan of the chest showing lung nodules -Stable lung nodules  .  Atelectasis right base -Further improvement in atelectasis   .  Past history of asthma -Has no symptoms suggestive of an exacerbation   .  Recent atrial fibrillation -On anticoagulation  Parkinson's  Plan:  No further radiological studies indicated  Encouraged  to stay active  Call with any significant concerns  I will see you back in about a year

## 2019-09-27 NOTE — Patient Instructions (Signed)
Stable status   CT scan remained stable  We do not need to do any testing  I will see you in a year from now

## 2019-10-04 ENCOUNTER — Encounter: Payer: Self-pay | Admitting: Family Medicine

## 2019-10-04 ENCOUNTER — Telehealth: Payer: Self-pay | Admitting: Cardiology

## 2019-10-04 NOTE — Telephone Encounter (Signed)
New message    Patient calling, wants to know if it is safe for him to participate in boxing program for Parkinson's Patient to upload form in [place] for [employer].

## 2019-10-06 NOTE — Progress Notes (Signed)
Subjective:   Walter Brown is a 78 y.o. male who presents for Medicare Annual/Subsequent preventive examination.  Review of Systems: Home Safety/Smoke Alarms: Feels safe in home. Smoke alarms in place.  Lives w/ wife in 1 story home. Walk-in shower.   Male:   CCS- pt reports last done 10/07/09-normal     PSA- No results found for: PSA      Objective:    Vitals: BP 140/86 (BP Location: Left Arm, Patient Position: Sitting, Cuff Size: Normal)   Pulse 61   Temp 97.9 F (36.6 C) (Temporal)   Ht 5\' 5"  (1.651 m)   Wt 168 lb 3.2 oz (76.3 kg)   SpO2 98%   BMI 27.99 kg/m   Body mass index is 27.99 kg/m.  Advanced Directives 10/11/2019 03/31/2019 10/05/2018 08/27/2018 10/03/2017 03/05/2016 03/03/2015  Does Patient Have a Medical Advance Directive? Yes Yes Yes No Yes Yes Yes  Type of Paramedic of [place];Living will Living will;Healthcare Power of Attorney Living will;Healthcare Power of [place];Living will [place];Living will [place];Living will  Does patient want to make changes to medical advance directive? No - Patient declined No - Patient declined - - - No - Patient declined Yes - information given  Copy of [place] in Chart? No - copy requested No - copy requested No - copy requested - No - copy requested Yes No - copy requested  Would patient like information on creating a medical advance directive? - - - No - Patient declined - - -    Tobacco Social History   Tobacco Use  Smoking Status Former Smoker  . Packs/day: 1.50  . Years: 15.00  . Pack years: 22.50  . Start date: 10/07/1978  Smokeless Tobacco Never Used     Counseling given: Not Answered   Clinical Intake:     Pain : No/denies pain       Past Medical History:  Diagnosis Date  . Anemia    mild  . Arthritis 04/06/2017  . Asthma    childhood  . Basal cell carcinoma    skin- on nose- basal  cell (20 yrs ago) forehead 1 year ago  . BPH (benign prostatic hyperplasia)   . Cerumen impaction 08/10/2012  . Chicken pox as child  . Depression 08/01/2012  . [place] measles as a child  . Hyperlipidemia   . Hypertension   . Macular degeneration   . Medicare annual wellness visit, subsequent 11/07/2013  . Otitis externa 08/10/2012  . PAF (paroxysmal atrial fibrillation) ([place])   . Parkinson disease ([place])   . Preventative health care 08/01/2012  . Sleep apnea   . Stroke ([place])   . Thrombocytopenia ([place])    Past Surgical History:  Procedure Laterality Date  . [place]     very young, b/l  . EXCISIONAL HEMORRHOIDECTOMY    . HYDROCELE EXCISION / REPAIR  2012   b/l  . SKIN CANCER EXCISION     nose and forehead, basal cell CA   Family History  Problem Relation Age of Onset  . Hypertension Mother   . Other Mother        MRSA  . Heart disease Mother        stents  . Heart disease Father   . Hypertension Father   . Prostate cancer Father 8  . Parkinson's disease Father   . Prostate cancer Maternal Grandfather   . ADD /  ADHD Son        ADHD  . Other Son 24       part of 1 lung removed- due to infection  . Stroke Maternal Grandmother   . Cancer Paternal Grandfather        EYE   Social History   Socioeconomic History  . Marital status: Married    Spouse name: Not on file  . Number of children: 2  . Years of education: Not on file  . Highest education level: Not on file  Occupational History  . Occupation: retired  Tobacco Use  . Smoking status: Former Smoker    Packs/day: 1.50    Years: 15.00    Pack years: 22.50    Start date: 10/07/1978  . Smokeless tobacco: Never Used  Substance and Sexual Activity  . Alcohol use: Yes    Comment: 2 glasses of wine daily  . Drug use: No  . 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  Other Topics Concern  . Not on file    Social History Narrative  . Not on file   Social Determinants of Health   Financial Resource Strain:   . Difficulty of Paying Living Expenses: Not on file  Food Insecurity:   . Worried About Charity fundraiser in the Last Year: Not on file  . Ran Out of Food in the Last Year: Not on file  Transportation Needs:   . Lack of Transportation (Medical): Not on file  . Lack of Transportation (Non-Medical): Not on file  Physical Activity:   . Days of Exercise per Week: Not on file  . Minutes of Exercise per Session: Not on file  Stress:   . Feeling of Stress : Not on file  Social Connections:   . Frequency of Communication with Friends and Family: Not on file  . Frequency of Social Gatherings with Friends and Family: Not on file  . Attends Religious Services: Not on file  . Active Member of Clubs or Organizations: Not on file  . Attends Archivist Meetings: Not on file  . Marital Status: Not on file    Outpatient Encounter Medications as of 10/11/2019  Medication Sig  . amiodarone (PACERONE) 100 MG tablet Take 1 tablet (100 mg total) by mouth daily.  Marland Kitchen ELIQUIS 5 MG TABS tablet TAKE 1 TABLET BY MOUTH TWICE A DAY  . hydrochlorothiazide (HYDRODIURIL) 25 MG tablet TAKE 1 TABLET BY MOUTH EVERY DAY (Patient taking differently: 12.5 mg. )  . Multiple Vitamins-Minerals (ICAPS AREDS 2 PO) Take by mouth.  . rosuvastatin (CRESTOR) 10 MG tablet TAKE 1/2 TABLET BY MOUTH DAILY  . sertraline (ZOLOFT) 25 MG tablet Take 1 tablet (25 mg total) by mouth daily.  Marland Kitchen terazosin (HYTRIN) 10 MG capsule Take 20 mg by mouth at bedtime.    No facility-administered encounter medications on file as of 10/11/2019.    Activities of Daily Living In your present state of health, do you have any difficulty performing the following activities: 10/11/2019  Hearing? Y  Comment wearing bilateral hearing aids  Vision? N  Difficulty concentrating or making decisions? N  Walking or climbing stairs? N  Dressing or  bathing? N  Doing errands, shopping? N  Preparing Food and eating ? N  Using the Toilet? N  In the past six months, have you accidently leaked urine? N  Do you have problems with loss of  bowel control? N  Managing your Medications? N  Managing your Finances? N  Housekeeping or managing your Housekeeping? N  Some recent data might be hidden    Patient Care Team: [name], MD as PCP - General (Family Medicine) [place], Jennefer Bravo, MD as Referring Physician (Dermatology) Luberta Mutter, MD as Consulting Physician (Ophthalmology) Ardis Hughs, MD as Consulting Physician (Urology) Tat, Eustace Quail, DO as Consulting Physician (Neurology)   Assessment:   This is a routine wellness examination for [place]. Physical assessment deferred to PCP.  Exercise Activities and Dietary recommendations Current Exercise Habits: Home exercise routine, Type of exercise: walking, Time (Minutes): 35, Frequency (Times/Week): 3, Weekly Exercise (Minutes/Week): 105, Intensity: Mild, Exercise limited by: None identified Diet (meal preparation, eat out, water intake, caffeinated beverages, dairy products, fruits and vegetables): well balanced, on average, 3 meals per day   Goals    . Come off of Zoloft and Statins    . Drink 6-8 glasses of water per day.    . Weight (lb) < 170 lb (77.1 kg) (pt-stated)     By continuing to exercise and eating a low carb diet.       Fall Risk Fall Risk  10/11/2019 09/21/2019 07/26/2019 10/05/2018 04/06/2018  Falls in the past year? 0 0 0 0 No  Number falls in past yr: - 0 - - -  Injury with Fall? - 0 - - -  Follow up Education provided;Falls prevention discussed - - - -    Depression Screen PHQ 2/9 Scores 10/11/2019 10/05/2018 04/06/2018 10/03/2017  PHQ - 2 Score 1 0 0 0    Cognitive Function Ad8 score reviewed for issues:  Issues making decisions:no  Less interest in hobbies / activities:no  Repeats questions, stories (family  complaining):no  Trouble using ordinary gadgets (microwave, computer, phone):no  Forgets the month or year: no  Mismanaging finances: no  Remembering appts:no  Daily problems with thinking and/or memory:no Ad8 score is=0    MMSE - Mini Mental State Exam 03/05/2016  Orientation to time 5  Orientation to Place 5  Registration 3  Attention/ Calculation 5  Recall 3  Language- name 2 objects 2  Language- repeat 1  Language- follow 3 step command 3  Language- read & follow direction 1  Write a sentence 1  Copy design 1  Total score 30     6CIT Screen 10/11/2019  What Year? 0 points  What month? 0 points  What time? 0 points  Count back from 20 0 points  Months in reverse 0 points  Repeat phrase 2 points  Total Score 2    Immunization History  Administered Date(s) Administered  . Influenza Split 07/02/2012  . Influenza Whole 06/07/2013  . Influenza, High Dose Seasonal PF 07/08/2017, 05/19/2019  . Influenza,inj,Quad PF,6+ Mos 06/21/2014  . Influenza-Unspecified 06/07/2016, 07/08/2017, 05/26/2018  . Pneumococcal Conjugate-13 11/04/2013  . Pneumococcal Polysaccharide-23 10/08/2007  . Tdap 11/04/2013  . Zoster 10/07/1998  . Zoster Recombinat (Shingrix) 05/26/2018, 11/05/2018   Screening Tests Health Maintenance  Topic Date Due  . TETANUS/TDAP  11/05/2023  . INFLUENZA VACCINE  Completed  . PNA vac Low Risk Adult  Completed       Plan:   See you next year!  Continue to eat heart healthy diet (full of fruits, vegetables, whole grains, lean protein, water--limit salt, fat, and sugar intake) and increase physical activity as tolerated.  Continue doing brain stimulating activities (puzzles, reading, adult coloring books, staying active) to keep memory sharp.  Bring a copy of your living will and/or healthcare power of attorney to your next office visit.    I have personally reviewed and noted the following in the patient's chart:   . Medical and social  history . Use of alcohol, tobacco or illicit drugs  . Current medications and supplements . Functional ability and status . Nutritional status . Physical activity . Advanced directives . List of other physicians . Hospitalizations, surgeries, and ER visits in previous 12 months . Vitals . Screenings to include cognitive, depression, and falls . Referrals and appointments  In addition, I have reviewed and discussed with patient certain preventive protocols, quality metrics, and best practice recommendations. A written personalized care plan for preventive services as well as general preventive health recommendations were provided to patient.     Shela Nevin, [place]  10/11/2019

## 2019-10-06 NOTE — Telephone Encounter (Signed)
Per review of mychart-this issue was addressed.  No action needed.

## 2019-10-10 ENCOUNTER — Telehealth: Payer: Self-pay | Admitting: Cardiology

## 2019-10-11 ENCOUNTER — Ambulatory Visit (INDEPENDENT_AMBULATORY_CARE_PROVIDER_SITE_OTHER): Payer: Medicare HMO | Admitting: *Deleted

## 2019-10-11 ENCOUNTER — Other Ambulatory Visit: Payer: Self-pay | Admitting: *Deleted

## 2019-10-11 ENCOUNTER — Encounter: Payer: Self-pay | Admitting: *Deleted

## 2019-10-11 ENCOUNTER — Other Ambulatory Visit: Payer: Self-pay

## 2019-10-11 VITALS — BP 140/86 | HR 61 | Temp 97.9°F | Ht 65.0 in | Wt 168.2 lb

## 2019-10-11 DIAGNOSIS — Z0000 Encounter for general adult medical examination without abnormal findings: Secondary | ICD-10-CM | POA: Diagnosis not present

## 2019-10-11 MED ORDER — AMIODARONE HCL 100 MG PO TABS: 100.0000 mg | ORAL_TABLET | Freq: Every day | ORAL | 0 refills | Status: DC

## 2019-10-11 NOTE — Telephone Encounter (Signed)
Patient has a followup scheduled, please refill

## 2019-10-11 NOTE — Telephone Encounter (Signed)
Refill for amiodarone 200 mg daily sent

## 2019-10-11 NOTE — Patient Instructions (Addendum)
See you next year!  Continue to eat heart healthy diet (full of fruits, vegetables, whole grains, lean protein, water--limit salt, fat, and sugar intake) and increase physical activity as tolerated.  Continue doing brain stimulating activities (puzzles, reading, adult coloring books, staying active) to keep memory sharp.   Bring a copy of your living will and/or healthcare power of attorney to your next office visit.   Walter Brown , Thank you for taking time to come for your Medicare Wellness Visit. I appreciate your ongoing commitment to your health goals. Please review the following plan we discussed and let me know if I can assist you in the future.   These are the goals we discussed: Goals    . Come off of Zoloft and Statins    . Drink 6-8 glasses of water per day.    . Patient Stated     Maintain balance and memory (new dx of Parkinsons).    . Weight (lb) < 170 lb (77.1 kg) (pt-stated)     By continuing to exercise and eating a low carb diet.       This is a list of the screening recommended for you and due dates:  Health Maintenance  Topic Date Due  . Tetanus Vaccine  11/05/2023  . Flu Shot  Completed  . Pneumonia vaccines  Completed    Preventive Care 3 Years and Older, Male Preventive care refers to lifestyle choices and visits with your health care provider that can promote health and wellness. This includes:  A yearly physical exam. This is also called an annual well check.  Regular dental and eye exams.  Immunizations.  Screening for certain conditions.  Healthy lifestyle choices, such as diet and exercise. What can I expect for my preventive care visit? Physical exam Your health care provider will check:  Height and weight. These may be used to calculate body mass index (BMI), which is a measurement that tells if you are at a healthy weight.  Heart rate and blood pressure.  Your skin for abnormal spots. Counseling Your health care provider may ask  you questions about:  Alcohol, tobacco, and drug use.  Emotional well-being.  Home and relationship well-being.  Sexual activity.  Eating habits.  History of falls.  Memory and ability to understand (cognition).  Work and work Statistician. What immunizations do I need?  Influenza (flu) vaccine  This is recommended every year. Tetanus, diphtheria, and pertussis (Tdap) vaccine  You may need a Td booster every 10 years. Varicella (chickenpox) vaccine  You may need this vaccine if you have not already been vaccinated. Zoster (shingles) vaccine  You may need this after age 82. Pneumococcal conjugate (PCV13) vaccine  One dose is recommended after age 61. Pneumococcal polysaccharide (PPSV23) vaccine  One dose is recommended after age 41. Measles, mumps, and rubella (MMR) vaccine  You may need at least one dose of MMR if you were born in 1957 or later. You may also need a second dose. Meningococcal conjugate (MenACWY) vaccine  You may need this if you have certain conditions. Hepatitis A vaccine  You may need this if you have certain conditions or if you travel or work in places where you may be exposed to hepatitis A. Hepatitis B vaccine  You may need this if you have certain conditions or if you travel or work in places where you may be exposed to hepatitis B. Haemophilus influenzae type b (Hib) vaccine  You may need this if you have certain conditions.  You may receive vaccines as individual doses or as more than one vaccine together in one shot (combination vaccines). Talk with your health care provider about the risks and benefits of combination vaccines. What tests do I need? Blood tests  Lipid and cholesterol levels. These may be checked every 5 years, or more frequently depending on your overall health.  Hepatitis C test.  Hepatitis B test. Screening  Lung cancer screening. You may have this screening every year starting at age 18 if you have a  30-pack-year history of smoking and currently smoke or have quit within the past 15 years.  Colorectal cancer screening. All adults should have this screening starting at age 80 and continuing until age 29. Your health care provider may recommend screening at age 3 if you are at increased risk. You will have tests every 1-10 years, depending on your results and the type of screening test.  Prostate cancer screening. Recommendations will vary depending on your family history and other risks.  Diabetes screening. This is done by checking your blood sugar (glucose) after you have not eaten for a while (fasting). You may have this done every 1-3 years.  Abdominal aortic aneurysm (AAA) screening. You may need this if you are a current or former smoker.  Sexually transmitted disease (STD) testing. Follow these instructions at home: Eating and drinking  Eat a diet that includes fresh fruits and vegetables, whole grains, lean protein, and low-fat dairy products. Limit your intake of foods with high amounts of sugar, saturated fats, and salt.  Take vitamin and mineral supplements as recommended by your health care provider.  Do not drink alcohol if your health care provider tells you not to drink.  If you drink alcohol: ? Limit how much you have to 0-2 drinks a day. ? Be aware of how much alcohol is in your drink. In the [place] one drink equals one 12 oz bottle of beer (355 mL), one 5 oz glass of wine (148 mL), or one 1 oz glass of hard liquor (44 mL). Lifestyle  Take daily care of your teeth and gums.  Stay active. Exercise for at least 30 minutes on 5 or more days each week.  Do not use any products that contain nicotine or tobacco, such as cigarettes, e-cigarettes, and chewing tobacco. If you need help quitting, ask your health care provider.  If you are sexually active, practice safe sex. Use a condom or other form of protection to prevent STIs (sexually transmitted infections).  Talk  with your health care provider about taking a low-dose aspirin or statin. What's next?  Visit your health care provider once a year for a well check visit.  Ask your health care provider how often you should have your eyes and teeth checked.  Stay up to date on all vaccines. This information is not intended to replace advice given to you by your health care provider. Make sure you discuss any questions you have with your health care provider. Document Revised: 09/17/2018 Document Reviewed: 09/17/2018 Elsevier Patient Education  2020 [employer].

## 2019-10-12 MED ORDER — AMIODARONE HCL 100 MG PO TABS: 100.0000 mg | ORAL_TABLET | Freq: Every day | ORAL | 0 refills | Status: DC

## 2019-10-14 ENCOUNTER — Ambulatory Visit (HOSPITAL_COMMUNITY)
Admission: RE | Admit: 2019-10-14 | Discharge: 2019-10-14 | Disposition: A | Discharge status: Home / Self Care | Payer: Medicare HMO | Source: Ambulatory Visit | Attending: Neurology | Admitting: Neurology

## 2019-10-14 ENCOUNTER — Other Ambulatory Visit: Payer: Self-pay

## 2019-10-14 ENCOUNTER — Encounter (HOSPITAL_COMMUNITY)
Admission: RE | Admit: 2019-10-14 | Discharge: 2019-10-14 | Disposition: A | Discharge status: Home / Self Care | Payer: Medicare HMO | Source: Ambulatory Visit | Attending: Neurology | Admitting: Neurology

## 2019-10-14 DIAGNOSIS — R251 Tremor, unspecified: Secondary | ICD-10-CM | POA: Diagnosis not present

## 2019-10-14 MED ORDER — IODINE STRONG (LUGOLS) 5 % PO SOLN
ORAL | Status: AC
  Administered 2019-10-14: 0.8 mL via ORAL
  Filled 2019-10-14: qty 1

## 2019-10-18 NOTE — Progress Notes (Signed)
Walter Brown was seen today in follow up for results of his DaTscan.  When I saw him last visit, I felt he likely had mild Parkinson's disease, Hoehn and Yoehr stage I.  However, he was on amiodarone, which can rarely produce parkinsonism.  Therefore, we did a DaTscan.  This was done on October 14, 2019.  I reviewed the images myself and with the patient.  There was loss of dopamine in the left striata him.  My previous records were reviewed prior to todays visit as well as primary care, whom he saw on January 4. Pt denies falls.  Pt denies lightheadedness, near syncope.  No hallucinations.  Mood has been good.   Current prescribed movement disorder medications: none   PREVIOUS MEDICATIONS: none to date  ALLERGIES:   Allergies  Allergen Reactions  . Albuterol Sulfate Palpitations  . Azithromycin Other (See Comments)    Hepatotoxicity  . Doxycycline Hyclate Other (See Comments)    Taken with Azithromycin and had Heaptotoxicity    CURRENT MEDICATIONS:  Outpatient Encounter Medications as of 10/19/2019  Medication Sig  . amiodarone (PACERONE) 100 MG tablet Take 1 tablet (100 mg total) by mouth daily.  Marland Kitchen ELIQUIS 5 MG TABS tablet TAKE 1 TABLET BY MOUTH TWICE A DAY  . hydrochlorothiazide (HYDRODIURIL) 25 MG tablet TAKE 1 TABLET BY MOUTH EVERY DAY (Patient taking differently: 12.5 mg. )  . Multiple Vitamins-Minerals (ICAPS AREDS 2 PO) Take by mouth.  . rosuvastatin (CRESTOR) 10 MG tablet TAKE 1/2 TABLET BY MOUTH DAILY  . sertraline (ZOLOFT) 25 MG tablet Take 1 tablet (25 mg total) by mouth daily.  Marland Kitchen terazosin (HYTRIN) 10 MG capsule Take 20 mg by mouth at bedtime.    No facility-administered encounter medications on file as of 10/19/2019.    PHYSICAL EXAMINATION:    VITALS:   Vitals:   10/19/19 1133  BP: (!) 165/76  Pulse: 62  SpO2: 100%  Weight: 165 lb 9.6 oz (75.1 kg)  Height: 5\' 5"  (1.651 m)    GEN:  The patient appears stated age and is in NAD. HEENT:  Normocephalic,  atraumatic.  The mucous membranes are moist. The superficial temporal arteries are without ropiness or tenderness. CV:  RRR Lungs:  CTAB Neck/HEME:  There are no carotid bruits bilaterally.  Neurological examination:  Orientation: The patient is alert and oriented x3. Cranial nerves: There is good facial symmetry with mild facial hypomimia. The speech is fluent and clear. Soft palate rises symmetrically and there is no tongue deviation. Hearing is intact to conversational tone. Sensation: Sensation is intact to light touch throughout Motor: Strength is at least antigravity x4.  Movement examination: Tone: There is mild increased tone in the right upper extremity Abnormal movements: Right upper extremity tremor with ambulation only.  Did not notice any with distraction procedures otherwise Coordination:  There is mild decremation with RAM's, with finger taps bilaterally and toe taps on the left Gait and Station: The patient has no difficulty arising out of a deep-seated chair without the use of the hands. The patient's stride length is good with decreased arm swing on the right hand reemergent tremor in the right upper extremity.    I have reviewed and interpreted the following labs independently   Chemistry      Component Value Date/Time   NA 141 08/23/2019 1352   K 4.0 08/23/2019 1352   CL 104 08/23/2019 1352   CO2 24 08/23/2019 1352   BUN 16 08/23/2019 1352  CREATININE 1.01 08/23/2019 1352   CREATININE 1.13 02/22/2014 1006      Component Value Date/Time   CALCIUM 8.9 08/23/2019 1352   ALKPHOS 67 08/23/2019 1352   AST 20 08/23/2019 1352   ALT 16 08/23/2019 1352   BILITOT 0.5 08/23/2019 1352     Lab Results  Component Value Date   TSH 0.89 07/20/2019      ASSESSMENT/PLAN:  1.  Parkinson's disease, Hoehn and Yoehr stage 1  -Patient with family history of Parkinson's disease.  DaTscan abnormal.  -Patient really does not want any medication right now.  I think that is  reasonable.  -Discussed exercise.  Discussed types of exercise.  Discussed prognostic features.  2.  Memory change  -Patient with upcoming appoint with Dr. Melvyn Novas on February 18  Total time spent on today's visit was 30 minutes, including both face-to-face time and nonface-to-face time.  Time included that spent on review of records (prior notes available to me/labs/imaging if pertinent), discussing treatment and goals, answering patient's questions and coordinating care.  Cc:  [name], MD

## 2019-10-19 ENCOUNTER — Encounter: Payer: Self-pay | Admitting: Neurology

## 2019-10-19 ENCOUNTER — Ambulatory Visit (INDEPENDENT_AMBULATORY_CARE_PROVIDER_SITE_OTHER): Payer: Medicare HMO | Admitting: Neurology

## 2019-10-19 ENCOUNTER — Other Ambulatory Visit: Payer: Self-pay

## 2019-10-19 VITALS — BP 138/70 | HR 62 | Ht 65.0 in | Wt 165.6 lb

## 2019-10-19 DIAGNOSIS — G20 Parkinson's disease: Secondary | ICD-10-CM

## 2019-10-20 ENCOUNTER — Ambulatory Visit: Payer: Medicare Other | Attending: Internal Medicine

## 2019-10-20 DIAGNOSIS — Z23 Encounter for immunization: Secondary | ICD-10-CM | POA: Insufficient documentation

## 2019-10-20 NOTE — Progress Notes (Signed)
   Covid-19 Vaccination Clinic  Name:  Walter Brown    MRN: [id number] DOB: 09/05/1941  10/20/2019  Walter Brown was observed post Covid-19 immunization for 30 minutes based on pre-vaccination screening without incidence. He was provided with Vaccine Information Sheet and instruction to access the V-Safe system.   Walter Brown was instructed to call 911 with any severe reactions post vaccine: Marland Kitchen Difficulty breathing  . Swelling of your face and throat  . A fast heartbeat  . A bad rash all over your body  . Dizziness and weakness    Immunizations Administered    Name Date Dose VIS Date Route   Pfizer COVID-19 Vaccine 10/20/2019 12:21 PM 0.3 mL 09/17/2019 Intramuscular   Manufacturer: [place]   Lot: [id number]   [place]: [id number]

## 2019-11-01 DIAGNOSIS — H353221 Exudative age-related macular degeneration, left eye, with active choroidal neovascularization: Secondary | ICD-10-CM | POA: Diagnosis not present

## 2019-11-01 DIAGNOSIS — H353112 Nonexudative age-related macular degeneration, right eye, intermediate dry stage: Secondary | ICD-10-CM | POA: Diagnosis not present

## 2019-11-01 DIAGNOSIS — H35372 Puckering of macula, left eye: Secondary | ICD-10-CM | POA: Diagnosis not present

## 2019-11-07 ENCOUNTER — Other Ambulatory Visit: Payer: Self-pay | Admitting: Cardiology

## 2019-11-07 ENCOUNTER — Other Ambulatory Visit: Payer: Self-pay | Admitting: Family Medicine

## 2019-11-10 ENCOUNTER — Ambulatory Visit: Payer: Medicare HMO | Attending: Internal Medicine

## 2019-11-10 DIAGNOSIS — Z23 Encounter for immunization: Secondary | ICD-10-CM | POA: Insufficient documentation

## 2019-11-10 NOTE — Progress Notes (Signed)
   Covid-19 Vaccination Clinic  Name:  Walter Brown    MRN: [id number] DOB: [birth year]  11/10/2019  [name] was observed post Covid-19 immunization for 15 minutes without incidence. He was provided with Vaccine Information Sheet and instruction to access the V-Safe system.   [name] was instructed to call 911 with any severe reactions post vaccine: Marland Kitchen Difficulty breathing  . Swelling of your face and throat  . A fast heartbeat  . A bad rash all over your body  . Dizziness and weakness    Immunizations Administered    Name Date Dose VIS Date Route   Pfizer COVID-19 Vaccine 11/10/2019  9:36 AM 0.3 mL 09/17/2019 Intramuscular   Manufacturer: [place]   Lot: [id number]   [place]: [id number]

## 2019-11-23 ENCOUNTER — Other Ambulatory Visit: Payer: Self-pay | Admitting: Cardiology

## 2019-11-23 ENCOUNTER — Encounter: Payer: Self-pay | Admitting: Cardiology

## 2019-11-23 ENCOUNTER — Other Ambulatory Visit: Payer: Self-pay

## 2019-11-23 ENCOUNTER — Ambulatory Visit (INDEPENDENT_AMBULATORY_CARE_PROVIDER_SITE_OTHER): Payer: Medicare HMO | Admitting: Cardiology

## 2019-11-23 VITALS — BP 126/70 | HR 62 | Ht 65.0 in | Wt 170.0 lb

## 2019-11-23 DIAGNOSIS — I2510 Atherosclerotic heart disease of native coronary artery without angina pectoris: Secondary | ICD-10-CM

## 2019-11-23 DIAGNOSIS — I480 Paroxysmal atrial fibrillation: Secondary | ICD-10-CM | POA: Diagnosis not present

## 2019-11-23 DIAGNOSIS — I2584 Coronary atherosclerosis due to calcified coronary lesion: Secondary | ICD-10-CM | POA: Diagnosis not present

## 2019-11-23 DIAGNOSIS — E782 Mixed hyperlipidemia: Secondary | ICD-10-CM

## 2019-11-23 DIAGNOSIS — I7090 Unspecified atherosclerosis: Secondary | ICD-10-CM | POA: Diagnosis not present

## 2019-11-23 DIAGNOSIS — R002 Palpitations: Secondary | ICD-10-CM

## 2019-11-23 DIAGNOSIS — I10 Essential (primary) hypertension: Secondary | ICD-10-CM | POA: Diagnosis not present

## 2019-11-23 DIAGNOSIS — Z1329 Encounter for screening for other suspected endocrine disorder: Secondary | ICD-10-CM

## 2019-11-23 DIAGNOSIS — R911 Solitary pulmonary nodule: Secondary | ICD-10-CM

## 2019-11-23 DIAGNOSIS — R9389 Abnormal findings on diagnostic imaging of other specified body structures: Secondary | ICD-10-CM

## 2019-11-23 HISTORY — DX: Atherosclerotic heart disease of native coronary artery without angina pectoris: I25.10

## 2019-11-23 NOTE — Patient Instructions (Signed)
Medication Instructions:  No medication changes *If you need a refill on your cardiac medications before your next appointment, please call your pharmacy*  Lab Work: You need to come into the office and have fasting labs done.  You can come Monday-Friday 8:30-12:00 and 1:15-4:30. You will have a BMET, CBC, LFT, TSH and Lipids drawn. If you have labs (blood work) drawn today and your tests are completely normal, you will receive your results only by: Marland Kitchen MyChart Message (if you have MyChart) OR . A paper copy in the mail If you have any lab test that is abnormal or we need to change your treatment, we will call you to review the results.  Testing/Procedures: None ordered  Follow-Up: At [facility], you and your health needs are our priority.  As part of our continuing mission to provide you with exceptional heart care, we have created designated Provider Care Teams.  These Care Teams include your primary Cardiologist (physician) and Advanced Practice Providers (APPs -  Physician Assistants and Nurse Practitioners) who all work together to provide you with the care you need, when you need it.  Your next appointment:   4 month(s)  The format for your next appointment:   In Person  Provider:   Jyl Heinz, MD  Other Instructions NA

## 2019-11-23 NOTE — Progress Notes (Signed)
Cardiology Office Note:    Date:  11/23/2019   ID:  Walter Brown, DOB 1940/12/13, MRN [id number]  PCP:  [name], MD  Cardiologist:  Jenean Lindau, MD   Referring MD: [name], MD    ASSESSMENT:    1. Essential hypertension   2. ASVD (arteriosclerotic vascular disease)   3. PAF (paroxysmal atrial fibrillation) (HCC)   4. Palpitations   5. Coronary artery calcification    PLAN:    In order of problems listed above:  1. Paroxysmal atrial fibrillation:I discussed with the patient atrial fibrillation, disease process. Management and therapy including rate and rhythm control, anticoagulation benefits and potential risks were discussed extensively with the patient. Patient had multiple questions which were answered to patient's satisfaction. 2. Essential hypertension: Blood pressure is stable 3. Coronary artery disease: Coronary calcification seen on CT scan.  Asymptomatic and an appropriate guideline directed medical therapy. 4. Mixed dyslipidemia: Diet was discussed and will be back in the next few days for blood work.  We will advise him accordingly. 5. He is stressed and he is doing it very well and regularly. 6. Patient will be seen in follow-up appointment in 4 months or earlier if the patient has any concerns    Medication Adjustments/Labs and Tests Ordered: Current medicines are reviewed at length with the patient today.  Concerns regarding medicines are outlined above.  No orders of the defined types were placed in this encounter.  No orders of the defined types were placed in this encounter.    Chief Complaint  Patient presents with  . Follow-up    3 Months     History of Present Illness:    Walter Brown is a 79 y.o. male.  Patient has past medical history of paroxysmal atrial fibrillation, essential hypertension and coronary artery disease.  He denies any problems at this time and takes care of activities of daily living.  No chest pain  orthopnea or PND.  At the time of my evaluation, the patient is alert awake oriented and in no distress.  He is a very active gentleman.  Past Medical History:  Diagnosis Date  . Anemia    mild  . Arthritis 04/06/2017  . Asthma    childhood  . Basal cell carcinoma    skin- on nose- basal cell (20 yrs ago) forehead 1 year ago  . BPH (benign prostatic hyperplasia)   . Cerumen impaction 08/10/2012  . Chicken pox as child  . Depression 08/01/2012  . [place] measles as a child  . Hyperlipidemia   . Hypertension   . Macular degeneration   . Medicare annual wellness visit, subsequent 11/07/2013  . Otitis externa 08/10/2012  . PAF (paroxysmal atrial fibrillation) ([place])   . Parkinson disease ([place])   . Preventative health care 08/01/2012  . Sleep apnea   . Stroke ([place])   . Thrombocytopenia ([place])     Past Surgical History:  Procedure Laterality Date  . [place]     very young, b/l  . EXCISIONAL HEMORRHOIDECTOMY    . HYDROCELE EXCISION / REPAIR  2012   b/l  . SKIN CANCER EXCISION     nose and forehead, basal cell CA    Current Medications: Current Meds  Medication Sig  . amiodarone (PACERONE) 100 MG tablet Take 1 tablet (100 mg total) by mouth daily.  Marland Kitchen ELIQUIS 5 MG TABS tablet TAKE 1 TABLET BY MOUTH TWICE A DAY  . hydrochlorothiazide (HYDRODIURIL) 25 MG tablet TAKE  1 TABLET BY MOUTH EVERY DAY (Patient taking differently: 12.5 mg. )  . Multiple Vitamins-Minerals (ICAPS AREDS 2 PO) Take by mouth.  . sertraline (ZOLOFT) 25 MG tablet TAKE 1 TABLET BY MOUTH EVERY DAY  . terazosin (HYTRIN) 10 MG capsule Take 20 mg by mouth at bedtime.      Allergies:   Albuterol sulfate, Azithromycin, and Doxycycline hyclate   Social History   Socioeconomic History  . Marital status: Married    Spouse name: Not on file  . Number of children: 2  . Years of education: Not on file  . Highest education level: Not on file  Occupational History  . Occupation: retired  Tobacco Use  .  Smoking status: Former Smoker    Packs/day: 1.50    Years: 15.00    Pack years: 22.50    Start date: 10/07/1978  . Smokeless tobacco: Never Used  Substance and Sexual Activity  . Alcohol use: Yes    Alcohol/week: 2.0 standard drinks    Types: 2 Glasses of wine per week    Comment: 2 glasses of wine daily  . Drug use: No  . 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  Other Topics Concern  . Not on file  Social History Narrative  . Not on file   Social Determinants of Health   Financial Resource Strain:   . Difficulty of Paying Living Expenses: Not on file  Food Insecurity:   . Worried About Charity fundraiser in the Last Year: Not on file  . Ran Out of Food in the Last Year: Not on file  Transportation Needs:   . Lack of Transportation (Medical): Not on file  . Lack of Transportation (Non-Medical): Not on file  Physical Activity:   . Days of Exercise per Week: Not on file  . Minutes of Exercise per Session: Not on file  Stress:   . Feeling of Stress : Not on file  Social Connections:   . Frequency of Communication with Friends and Family: Not on file  . Frequency of Social Gatherings with Friends and Family: Not on file  . Attends Religious Services: Not on file  . Active Member of Clubs or Organizations: Not on file  . Attends Archivist Meetings: Not on file  . Marital Status: Not on file     Family History: The patient's family history includes ADD / ADHD in his son; Cancer in his paternal grandfather; Heart disease in his father and mother; Hypertension in his father and mother; Other in his mother; Other (age of onset: 19) in his son; Parkinson's disease in his father; Prostate cancer in his maternal grandfather; Prostate cancer (age of onset: 45) in his father; Stroke in his maternal grandmother.  ROS:   Please see the history of present illness.    All other  systems reviewed and are negative.  EKGs/Labs/Other Studies Reviewed:    The following studies were reviewed today: EKG was sinus rhythm nonspecific ST-T changes.   Recent Labs: 07/20/2019: Hemoglobin 13.5; Platelets 134.0; TSH 0.89 08/23/2019: ALT 16; BUN 16; Creatinine, Ser 1.01; Potassium 4.0; Sodium 141  Recent Lipid Panel    Component Value Date/Time   CHOL 125 07/20/2019 1433   TRIG 79.0 07/20/2019 1433   HDL 72.50 07/20/2019 1433   CHOLHDL 2 07/20/2019 1433   VLDL 15.8 07/20/2019 1433   LDLCALC 37 07/20/2019 1433    Physical  Exam:    VS:  BP 126/70   Pulse 62   Ht 5\' 5"  (1.651 m)   Wt 170 lb (77.1 kg)   SpO2 96%   BMI 28.29 kg/m     Wt Readings from Last 3 Encounters:  11/23/19 170 lb (77.1 kg)  10/19/19 165 lb 9.6 oz (75.1 kg)  10/11/19 168 lb 3.2 oz (76.3 kg)     GEN: Patient is in no acute distress HEENT: Normal NECK: No JVD; No carotid bruits LYMPHATICS: No lymphadenopathy CARDIAC: Hear sounds regular, 2/6 systolic murmur at the apex. RESPIRATORY:  Clear to auscultation without rales, wheezing or rhonchi  ABDOMEN: Soft, non-tender, non-distended MUSCULOSKELETAL:  No edema; No deformity  SKIN: Warm and dry NEUROLOGIC:  Alert and oriented x 3 PSYCHIATRIC:  Normal affect   Signed, Jenean Lindau, MD  11/23/2019 1:36 PM    [facility]

## 2019-11-24 DIAGNOSIS — Z7982 Long term (current) use of aspirin: Secondary | ICD-10-CM | POA: Diagnosis not present

## 2019-11-24 DIAGNOSIS — E785 Hyperlipidemia, unspecified: Secondary | ICD-10-CM | POA: Diagnosis not present

## 2019-11-24 DIAGNOSIS — K5900 Constipation, unspecified: Secondary | ICD-10-CM | POA: Diagnosis not present

## 2019-11-24 DIAGNOSIS — Z791 Long term (current) use of non-steroidal anti-inflammatories (NSAID): Secondary | ICD-10-CM | POA: Diagnosis not present

## 2019-11-24 DIAGNOSIS — Z008 Encounter for other general examination: Secondary | ICD-10-CM | POA: Diagnosis not present

## 2019-11-24 DIAGNOSIS — I10 Essential (primary) hypertension: Secondary | ICD-10-CM | POA: Diagnosis not present

## 2019-11-24 DIAGNOSIS — R69 Illness, unspecified: Secondary | ICD-10-CM | POA: Diagnosis not present

## 2019-11-24 DIAGNOSIS — D6869 Other thrombophilia: Secondary | ICD-10-CM | POA: Diagnosis not present

## 2019-11-24 DIAGNOSIS — Z7901 Long term (current) use of anticoagulants: Secondary | ICD-10-CM | POA: Diagnosis not present

## 2019-11-24 DIAGNOSIS — I4891 Unspecified atrial fibrillation: Secondary | ICD-10-CM | POA: Diagnosis not present

## 2019-11-25 ENCOUNTER — Encounter: Payer: Medicare HMO | Admitting: Psychology

## 2019-11-26 DIAGNOSIS — I2584 Coronary atherosclerosis due to calcified coronary lesion: Secondary | ICD-10-CM | POA: Diagnosis not present

## 2019-11-26 DIAGNOSIS — I7090 Unspecified atherosclerosis: Secondary | ICD-10-CM | POA: Diagnosis not present

## 2019-11-26 DIAGNOSIS — Z13220 Encounter for screening for lipoid disorders: Secondary | ICD-10-CM | POA: Diagnosis not present

## 2019-11-26 DIAGNOSIS — Z1329 Encounter for screening for other suspected endocrine disorder: Secondary | ICD-10-CM | POA: Diagnosis not present

## 2019-11-26 DIAGNOSIS — I2510 Atherosclerotic heart disease of native coronary artery without angina pectoris: Secondary | ICD-10-CM | POA: Diagnosis not present

## 2019-11-26 DIAGNOSIS — E782 Mixed hyperlipidemia: Secondary | ICD-10-CM | POA: Diagnosis not present

## 2019-11-26 DIAGNOSIS — I480 Paroxysmal atrial fibrillation: Secondary | ICD-10-CM | POA: Diagnosis not present

## 2019-11-26 DIAGNOSIS — I10 Essential (primary) hypertension: Secondary | ICD-10-CM | POA: Diagnosis not present

## 2019-11-26 DIAGNOSIS — R002 Palpitations: Secondary | ICD-10-CM | POA: Diagnosis not present

## 2019-11-26 DIAGNOSIS — I2109 ST elevation (STEMI) myocardial infarction involving other coronary artery of anterior wall: Secondary | ICD-10-CM | POA: Diagnosis not present

## 2019-11-27 LAB — HEPATIC FUNCTION PANEL
ALT: 17 IU/L (ref 0–44)
AST: 20 IU/L (ref 0–40)
Albumin: 4.2 g/dL (ref 3.7–4.7)
Alkaline Phosphatase: 67 IU/L (ref 39–117)
Bilirubin Total: 0.8 mg/dL (ref 0.0–1.2)
Bilirubin, Direct: 0.23 mg/dL (ref 0.00–0.40)
Total Protein: 6.1 g/dL (ref 6.0–8.5)

## 2019-11-27 LAB — CBC WITH DIFFERENTIAL/PLATELET
Basophils Absolute: 0.1 10*3/uL (ref 0.0–0.2)
Basos: 1 %
EOS (ABSOLUTE): 0 10*3/uL (ref 0.0–0.4)
Eos: 1 %
Hematocrit: 40.8 % (ref 37.5–51.0)
Hemoglobin: 13.9 g/dL (ref 13.0–17.7)
Immature Grans (Abs): 0 10*3/uL (ref 0.0–0.1)
Immature Granulocytes: 1 %
Lymphocytes Absolute: 1.3 10*3/uL (ref 0.7–3.1)
Lymphs: 15 %
MCH: 32.3 pg (ref 26.6–33.0)
MCHC: 34.1 g/dL (ref 31.5–35.7)
MCV: 95 fL (ref 79–97)
Monocytes Absolute: 0.7 10*3/uL (ref 0.1–0.9)
Monocytes: 7 %
Neutrophils Absolute: 6.8 10*3/uL (ref 1.4–7.0)
Neutrophils: 75 %
Platelets: 125 10*3/uL — ABNORMAL LOW (ref 150–450)
RBC: 4.3 x10E6/uL (ref 4.14–5.80)
RDW: 12.4 % (ref 11.6–15.4)
WBC: 8.9 10*3/uL (ref 3.4–10.8)

## 2019-11-27 LAB — BASIC METABOLIC PANEL
BUN/Creatinine Ratio: 10 (ref 10–24)
BUN: 12 mg/dL (ref 8–27)
CO2: 24 mmol/L (ref 20–29)
Calcium: 9.3 mg/dL (ref 8.6–10.2)
Chloride: 104 mmol/L (ref 96–106)
Creatinine, Ser: 1.16 mg/dL (ref 0.76–1.27)
GFR calc Af Amer: 69 mL/min/{1.73_m2} (ref >59)
GFR calc non Af Amer: 60 mL/min/{1.73_m2} (ref >59)
Glucose: 86 mg/dL (ref 65–99)
Potassium: 4.5 mmol/L (ref 3.5–5.2)
Sodium: 141 mmol/L (ref 134–144)

## 2019-11-27 LAB — TSH: TSH: 1.17 u[IU]/mL (ref 0.450–4.500)

## 2019-11-29 ENCOUNTER — Other Ambulatory Visit: Payer: Self-pay

## 2019-11-29 DIAGNOSIS — Z13220 Encounter for screening for lipoid disorders: Secondary | ICD-10-CM

## 2019-11-29 DIAGNOSIS — R69 Illness, unspecified: Secondary | ICD-10-CM | POA: Diagnosis not present

## 2019-11-29 DIAGNOSIS — I2510 Atherosclerotic heart disease of native coronary artery without angina pectoris: Secondary | ICD-10-CM

## 2019-11-29 DIAGNOSIS — I2584 Coronary atherosclerosis due to calcified coronary lesion: Secondary | ICD-10-CM

## 2019-11-30 DIAGNOSIS — I2510 Atherosclerotic heart disease of native coronary artery without angina pectoris: Secondary | ICD-10-CM | POA: Diagnosis not present

## 2019-11-30 DIAGNOSIS — I2584 Coronary atherosclerosis due to calcified coronary lesion: Secondary | ICD-10-CM | POA: Diagnosis not present

## 2019-11-30 DIAGNOSIS — Z13220 Encounter for screening for lipoid disorders: Secondary | ICD-10-CM | POA: Diagnosis not present

## 2019-12-01 LAB — LIPID PANEL
Chol/HDL Ratio: 1.8 ratio (ref 0.0–5.0)
Cholesterol, Total: 135 mg/dL (ref 100–199)
HDL: 77 mg/dL (ref >39)
LDL Chol Calc (NIH): 43 mg/dL (ref 0–99)
Triglycerides: 76 mg/dL (ref 0–149)
VLDL Cholesterol Cal: 15 mg/dL (ref 5–40)

## 2019-12-08 DIAGNOSIS — R69 Illness, unspecified: Secondary | ICD-10-CM | POA: Diagnosis not present

## 2019-12-09 ENCOUNTER — Telehealth: Payer: Medicare HMO | Admitting: Psychology

## 2019-12-19 DIAGNOSIS — H6121 Impacted cerumen, right ear: Secondary | ICD-10-CM | POA: Diagnosis not present

## 2019-12-21 LAB — LIPID PANEL
Chol/HDL Ratio: 1.8 ratio (ref 0.0–5.0)
Cholesterol, Total: 126 mg/dL (ref 100–199)
HDL: 70 mg/dL (ref >39)
LDL Chol Calc (NIH): 42 mg/dL (ref 0–99)
Triglycerides: 68 mg/dL (ref 0–149)
VLDL Cholesterol Cal: 14 mg/dL (ref 5–40)

## 2019-12-21 LAB — SPECIMEN STATUS REPORT

## 2019-12-22 DIAGNOSIS — H353122 Nonexudative age-related macular degeneration, left eye, intermediate dry stage: Secondary | ICD-10-CM | POA: Diagnosis not present

## 2019-12-22 DIAGNOSIS — H43813 Vitreous degeneration, bilateral: Secondary | ICD-10-CM | POA: Diagnosis not present

## 2019-12-22 DIAGNOSIS — H353221 Exudative age-related macular degeneration, left eye, with active choroidal neovascularization: Secondary | ICD-10-CM | POA: Diagnosis not present

## 2019-12-22 DIAGNOSIS — H35372 Puckering of macula, left eye: Secondary | ICD-10-CM | POA: Diagnosis not present

## 2019-12-24 ENCOUNTER — Encounter: Payer: Self-pay | Admitting: Psychology

## 2019-12-24 ENCOUNTER — Other Ambulatory Visit: Payer: Self-pay

## 2019-12-24 ENCOUNTER — Ambulatory Visit: Payer: Medicare HMO | Admitting: Psychology

## 2019-12-24 ENCOUNTER — Ambulatory Visit (INDEPENDENT_AMBULATORY_CARE_PROVIDER_SITE_OTHER): Payer: Medicare HMO | Admitting: Psychology

## 2019-12-24 DIAGNOSIS — G20 Parkinson's disease: Secondary | ICD-10-CM | POA: Diagnosis not present

## 2019-12-24 DIAGNOSIS — G3184 Mild cognitive impairment, so stated: Secondary | ICD-10-CM

## 2019-12-24 DIAGNOSIS — R4189 Other symptoms and signs involving cognitive functions and awareness: Secondary | ICD-10-CM

## 2019-12-24 NOTE — Progress Notes (Signed)
   Psychometrician Note   Cognitive testing was administered to Walter Brown by Milana Kidney, B.S. (psychometrist) under the supervision of Dr. Christia Reading, Ph.D., licensed psychologist. Walter Brown did not appear overtly distressed by the testing session per behavioral observation or responses across self-report questionnaires. Dr. Christia Reading, Ph.D. checked in with Walter Brown as needed to manage any distress related to testing procedures (if applicable). Rest breaks were offered.    The battery of tests administered was selected by Dr. Christia Reading, Ph.D. with consideration to Walter Brown current level of functioning, the nature of his symptoms, emotional and behavioral responses during interview, level of literacy, observed level of motivation/effort, and the nature of the referral question. This battery was communicated to the psychometrist. Communication between Dr. Christia Reading, Ph.D. and the psychometrist was ongoing throughout the evaluation and Dr. Christia Reading, Ph.D. was immediately accessible at all times. Dr. Christia Reading, Ph.D. provided supervision to the psychometrist on the date of this service to the extent necessary to assure the quality of all services provided.    Walter Brown will return within approximately 1-2 weeks for an interactive feedback session with Dr. Melvyn Novas at which time his test performances, clinical impressions, and treatment recommendations will be reviewed in detail. Walter Brown understands he can contact our office should he require our assistance before this time.  A total of 165 minutes of billable time were spent face-to-face with Walter Brown by the psychometrist. This includes both test administration and scoring time. Billing for these services is reflected in the clinical report generated by Dr. Christia Reading, Ph.D..  This note reflects time spent with the psychometrician and does not include test scores or any clinical interpretations  made by Dr. Melvyn Novas. The full report will follow in a separate note.

## 2019-12-24 NOTE — Progress Notes (Signed)
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 and scoring procedures. As a separate and discrete service, Dr. Melvyn Novas spent a total of 180 minutes in interpretation and report writing billed as one unit [phone] and two units [id number].

## 2019-12-27 ENCOUNTER — Encounter: Payer: Self-pay | Admitting: Psychology

## 2019-12-27 DIAGNOSIS — G20 Parkinson's disease: Secondary | ICD-10-CM

## 2019-12-27 DIAGNOSIS — F0670 Mild neurocognitive disorder due to known physiological condition without behavioral disturbance: Secondary | ICD-10-CM

## 2019-12-27 HISTORY — DX: Parkinson's disease: G20

## 2019-12-27 HISTORY — DX: Mild neurocognitive disorder due to known physiological condition without behavioral disturbance: F06.70

## 2020-01-04 ENCOUNTER — Telehealth (INDEPENDENT_AMBULATORY_CARE_PROVIDER_SITE_OTHER): Payer: Medicare HMO | Admitting: Psychology

## 2020-01-04 ENCOUNTER — Other Ambulatory Visit: Payer: Self-pay

## 2020-01-04 DIAGNOSIS — G20 Parkinson's disease: Secondary | ICD-10-CM | POA: Diagnosis not present

## 2020-01-04 DIAGNOSIS — G3184 Mild cognitive impairment, so stated: Secondary | ICD-10-CM

## 2020-01-04 NOTE — Progress Notes (Signed)
   Neuropsychology Feedback Session Walter Brown. [place] [facility] of Neurology  Reason for Referral:   Walter Brown a 79 y.o. right-handed Caucasian male referred by Alonza Bogus, D.O.,to characterize hiscurrent cognitive functioning and assist with diagnostic clarity and treatment planning in the context of subjective cognitive decline and history of Parkinson's disease.  Feedback:   Walter Brown completed a comprehensive neuropsychological evaluation on 12/24/2019. Please refer to that encounter for the full report and recommendations. Briefly, results suggested mild frontal/frontal-subcortical dysfunction. While somewhat variable, a primary weakness was exhibited across several aspects of executive functioning, including visuomotor cognitive flexibility, response inhibition, working memory, and problem solving/adaptability. An isolated weakness was also exhibited across a visual discrimination task, potentially influenced by his history of macular degeneration. Performance variability was further seen across processing speed and learning and memory. Regarding etiology, frontal/frontal-subcortical dysfunction is common in Parkinson's disease. As such, this represents the most likely culprit for objective weaknesses across testing. These deficits can also be seen in a primary vascular etiology which should remain on the differential. However, the former is viewed as more likely given that neuroimaging did not suggest abnormal for age white matter ischemic changes or prior history of cerebrovascular accident. Walter Brown identified his emotional well-being as a primary area of impairment across a Parkinson's disease symptom questionnaire and scored in the mild range for acute symptoms of both anxiety and depression. It is possible that mood-related concerns are further influencing cognitive performance to a mild extent.  Walter Brown was accompanied by his wife during the current  virtual feedback session. Content of the current session focused on the results of his neuropsychological evaluation. Walter Brown and his wife were given the opportunity to ask questions and their questions were answered. They were encouraged to reach out should additional questions arise. A copy of his report was mailed at the conclusion of the visit.      21 minutes were spent conducting the current feedback session with Walter Brown, billed as one unit [phone].

## 2020-01-21 ENCOUNTER — Telehealth (INDEPENDENT_AMBULATORY_CARE_PROVIDER_SITE_OTHER): Payer: Medicare HMO | Admitting: Family Medicine

## 2020-01-21 ENCOUNTER — Other Ambulatory Visit: Payer: Self-pay

## 2020-01-21 DIAGNOSIS — G20 Parkinson's disease: Secondary | ICD-10-CM

## 2020-01-21 DIAGNOSIS — I10 Essential (primary) hypertension: Secondary | ICD-10-CM | POA: Diagnosis not present

## 2020-01-21 DIAGNOSIS — E782 Mixed hyperlipidemia: Secondary | ICD-10-CM

## 2020-01-21 DIAGNOSIS — G3184 Mild cognitive impairment, so stated: Secondary | ICD-10-CM

## 2020-01-21 DIAGNOSIS — F0670 Mild neurocognitive disorder due to known physiological condition without behavioral disturbance: Secondary | ICD-10-CM

## 2020-01-21 NOTE — Assessment & Plan Note (Signed)
Tolerating statin, encouraged heart healthy diet, avoid trans fats, minimize simple carbs and saturated fats. Increase exercise as tolerated 

## 2020-01-21 NOTE — Assessment & Plan Note (Signed)
Monitor and report any concerns. no changes to meds. Encouraged heart healthy diet such as the DASH diet and exercise as tolerated.  

## 2020-01-21 NOTE — Assessment & Plan Note (Signed)
He has had his neuropsychiatric evaluation and is aware he has some deficits associated with Parkinson's

## 2020-01-21 NOTE — Progress Notes (Signed)
Virtual Visit via Video Note  I connected with Walter Brown on 01/21/20 at 10:40 AM EDT by a video enabled telemedicine application and verified that I am speaking with the correct person using two identifiers.  Location: Patient: home Provider: home   I discussed the limitations of evaluation and management by telemedicine and the availability of in person appointments. The patient expressed understanding and agreed to proceed. Magdalene Molly, CMA was able to get the patient set up on a video visit.    Subjective:    Patient ID: Walter Brown, male    DOB: 08/29/1941, 79 y.o.   MRN: [id number]  No chief complaint on file.   HPI Patient is in today for follow up on chronic medical concerns. Walter Brown feels well today. No recent febrile illness or hospitalizations. Walter Brown has been seen by [facility] neurology and neuropsychology and is aware of his findings and memory deficiencies. Walter Brown has no new or acute concerns. Denies CP/palp/SOB/HA/congestion/fevers/GI or GU c/o. Taking meds as prescribed  Past Medical History:  Diagnosis Date  . Anemia    mild  . Arthritis 04/06/2017  . Asthma    childhood  . ASVD (arteriosclerotic vascular disease) 09/07/2018  . Basal cell carcinoma    skin- on nose- basal cell (20 yrs ago) forehead 1 year ago  . BPH (benign prostatic hyperplasia)   . Bradycardia 06/28/2018  . Cerumen impaction 08/10/2012  . Chicken pox as child  . Coronary artery calcification 11/23/2019  . [place] measles as a child  . Hyperlipidemia   . Hypertension   . Irregular cardiac rhythm 03/10/2018  . Macular degeneration   . Major depressive disorder 08/01/2012  . Mild neurocognitive disorder due to Parkinson's disease ([place]) 12/27/2019  . Obstructive sleep apnea 11/09/2018   Patient reports mild symptoms; was not prescribed a CPAP machine  . Otitis externa 08/10/2012  . PAF (paroxysmal atrial fibrillation)   . Parkinson's disease 05/19/2019  . Thrombocytopenia   . TIA (transient ischemic attack)  03/10/2018    Past Surgical History:  Procedure Laterality Date  . [place]     very young, b/l  . EXCISIONAL HEMORRHOIDECTOMY    . HYDROCELE EXCISION / REPAIR  2012   b/l  . SKIN CANCER EXCISION     nose and forehead, basal cell CA    Family History  Problem Relation Age of Onset  . Hypertension Mother   . Other Mother        MRSA  . Heart disease Mother        stents  . Heart disease Father   . Hypertension Father   . Prostate cancer Father 46  . Parkinson's disease Father   . Prostate cancer Maternal Grandfather   . ADD / ADHD Son        ADHD  . Other Son 24       part of 1 lung removed- due to infection  . Stroke Maternal Grandmother   . Cancer Paternal Grandfather        EYE    Social History   Socioeconomic History  . Marital status: Married    Spouse name: Not on file  . Number of children: 2  . Years of education: 16  . Highest education level: Bachelor's degree (e.g., BA, AB, BS)  Occupational History  . Occupation: retired  Tobacco Use  . Smoking status: Former Smoker    Packs/day: 1.50    Years: 15.00    Pack years: 22.50    Start  date: 10/07/1978  . Smokeless tobacco: Never Used  Substance and Sexual Activity  . Alcohol use: Yes    Alcohol/week: 7.0 - 14.0 standard drinks    Types: 7 - 14 Glasses of wine per week    Comment: 1-2 glasses of wine with dinner  . Drug use: No  . 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  Other Topics Concern  . Not on file  Social History Narrative  . Not on file   Social Determinants of Health   Financial Resource Strain:   . Difficulty of Paying Living Expenses:   Food Insecurity:   . Worried About Charity fundraiser in the Last Year:   . Arboriculturist in the Last Year:   Transportation Needs:   . Film/video editor (Medical):   Marland Kitchen Lack of Transportation (Non-Medical):   Physical Activity:    . Days of Exercise per Week:   . Minutes of Exercise per Session:   Stress:   . Feeling of Stress :   Social Connections:   . Frequency of Communication with Friends and Family:   . Frequency of Social Gatherings with Friends and Family:   . Attends Religious Services:   . Active Member of Clubs or Organizations:   . Attends Archivist Meetings:   Marland Kitchen Marital Status:   Intimate Partner Violence:   . Fear of Current or Ex-Partner:   . Emotionally Abused:   Marland Kitchen Physically Abused:   . Sexually Abused:     Outpatient Medications Prior to Visit  Medication Sig Dispense Refill  . amiodarone (PACERONE) 100 MG tablet Take 1 tablet (100 mg total) by mouth daily. 90 tablet 0  . ELIQUIS 5 MG TABS tablet TAKE 1 TABLET BY MOUTH TWICE A DAY 60 tablet 6  . hydrochlorothiazide (HYDRODIURIL) 25 MG tablet TAKE 1 TABLET BY MOUTH EVERY DAY (Patient taking differently: 12.5 mg. ) 90 tablet 1  . Multiple Vitamins-Minerals (ICAPS AREDS 2 PO) Take by mouth.    . rosuvastatin (CRESTOR) 10 MG tablet TAKE 1/2 TABLET BY MOUTH DAILY (Patient not taking: Reported on 11/23/2019) 45 tablet 2  . sertraline (ZOLOFT) 25 MG tablet TAKE 1 TABLET BY MOUTH EVERY DAY 90 tablet 1  . terazosin (HYTRIN) 10 MG capsule Take 20 mg by mouth at bedtime.      No facility-administered medications prior to visit.    Allergies  Allergen Reactions  . Albuterol Sulfate Palpitations  . Azithromycin Other (See Comments)    Hepatotoxicity  . Doxycycline Hyclate Other (See Comments)    Taken with Azithromycin and had Heaptotoxicity    Review of Systems  Constitutional: Negative for fever and malaise/fatigue.  HENT: Negative for congestion.   Eyes: Negative for blurred vision.  Respiratory: Negative for shortness of breath.   Cardiovascular: Negative for chest pain, palpitations and leg swelling.  Gastrointestinal: Negative for abdominal pain, blood in stool and nausea.  Genitourinary: Negative for dysuria and frequency.   Musculoskeletal: Negative for falls.  Skin: Negative for rash.  Neurological: Positive for tremors. Negative for dizziness, loss of consciousness and headaches.  Endo/Heme/Allergies: Negative for environmental allergies.  Psychiatric/Behavioral: Positive for memory loss. Negative for depression. The patient is not nervous/anxious.        Objective:    Physical Exam Constitutional:      Appearance: Normal appearance. Walter Brown is not ill-appearing.  HENT:     Head:  Normocephalic and atraumatic.  Eyes:     General:        Right eye: No discharge.        Left eye: No discharge.  Pulmonary:     Effort: Pulmonary effort is normal.  Neurological:     Mental Status: Walter Brown is alert and oriented to person, place, and time.  Psychiatric:        Behavior: Behavior normal.     There were no vitals taken for this visit. Wt Readings from Last 3 Encounters:  11/23/19 170 lb (77.1 kg)  10/19/19 165 lb 9.6 oz (75.1 kg)  10/11/19 168 lb 3.2 oz (76.3 kg)    Diabetic Foot Exam - Simple   No data filed     Lab Results  Component Value Date   WBC 8.9 11/26/2019   HGB 13.9 11/26/2019   HCT 40.8 11/26/2019   PLT 125 (L) 11/26/2019   GLUCOSE 86 11/26/2019   CHOL 135 11/30/2019   TRIG 76 11/30/2019   HDL 77 11/30/2019   LDLCALC 43 11/30/2019   ALT 17 11/26/2019   AST 20 11/26/2019   NA 141 11/26/2019   K 4.5 11/26/2019   CL 104 11/26/2019   CREATININE 1.16 11/26/2019   BUN 12 11/26/2019   CO2 24 11/26/2019   TSH 1.170 11/26/2019   INR 1.0 12/15/2017    Lab Results  Component Value Date   TSH 1.170 11/26/2019   Lab Results  Component Value Date   WBC 8.9 11/26/2019   HGB 13.9 11/26/2019   HCT 40.8 11/26/2019   MCV 95 11/26/2019   PLT 125 (L) 11/26/2019   Lab Results  Component Value Date   NA 141 11/26/2019   K 4.5 11/26/2019   CO2 24 11/26/2019   GLUCOSE 86 11/26/2019   BUN 12 11/26/2019   CREATININE 1.16 11/26/2019   BILITOT 0.8 11/26/2019   ALKPHOS 67 11/26/2019    AST 20 11/26/2019   ALT 17 11/26/2019   PROT 6.1 11/26/2019   ALBUMIN 4.2 11/26/2019   CALCIUM 9.3 11/26/2019   ANIONGAP 12 08/27/2018   GFR 73.06 07/20/2019   Lab Results  Component Value Date   CHOL 135 11/30/2019   Lab Results  Component Value Date   HDL 77 11/30/2019   Lab Results  Component Value Date   LDLCALC 43 11/30/2019   Lab Results  Component Value Date   TRIG 76 11/30/2019   Lab Results  Component Value Date   CHOLHDL 1.8 11/30/2019   No results found for: HGBA1C     Assessment & Plan:   Problem List Items Addressed This Visit    Hyperlipidemia    Tolerating statin, encouraged heart healthy diet, avoid trans fats, minimize simple carbs and saturated fats. Increase exercise as tolerated      Hypertension    Monitor and report any concerns no changes to meds. Encouraged heart healthy diet such as the DASH diet and exercise as tolerated.       Parkinson's disease ([place])    Is following with [facility] neurology and is doing well      Mild neurocognitive disorder due to Parkinson's disease [facility])    Walter Brown has had his neuropsychiatric evaluation and is aware Walter Brown has some deficits associated with Parkinson's         I am having Lyndon O. Mcnelly maintain his terazosin, rosuvastatin, Eliquis, Multiple Vitamins-Minerals (ICAPS AREDS 2 PO), hydrochlorothiazide, amiodarone, and sertraline.  No orders of the defined types were placed in  this encounter.    I discussed the assessment and treatment plan with the patient. The patient was provided an opportunity to ask questions and all were answered. The patient agreed with the plan and demonstrated an understanding of the instructions.   The patient was advised to call back or seek an in-person evaluation if the symptoms worsen or if the condition fails to improve as anticipated.  I provided 25 minutes of non-face-to-face time during this encounter.   Penni Homans, MD

## 2020-01-21 NOTE — Assessment & Plan Note (Signed)
Is following with [facility] neurology and is doing well

## 2020-01-24 ENCOUNTER — Ambulatory Visit: Payer: Medicare HMO | Admitting: Neurology

## 2020-01-29 ENCOUNTER — Encounter: Payer: Self-pay | Admitting: Cardiology

## 2020-02-03 ENCOUNTER — Other Ambulatory Visit: Payer: Self-pay | Admitting: Cardiology

## 2020-02-03 NOTE — Telephone Encounter (Signed)
LOV with Dr. Geraldo Pitter was on 11/23/2019

## 2020-02-11 ENCOUNTER — Telehealth: Payer: Self-pay | Admitting: Cardiology

## 2020-02-11 MED ORDER — ROSUVASTATIN CALCIUM 10 MG PO TABS: 10.0000 mg | ORAL_TABLET | Freq: Every day | ORAL | 3 refills | Status: DC

## 2020-02-11 NOTE — Telephone Encounter (Signed)
Pt is to take 10 mg Crestor daily. Prescripition corrected. Pt aware.

## 2020-02-11 NOTE — Telephone Encounter (Signed)
Pt c/o medication issue:  1. Name of Medication: rosuvastatin (CRESTOR) 10 MG tablet  2. How are you currently taking this medication (dosage and times per day)? Half tablet of 10mg   3. Are you having a reaction (difficulty breathing--STAT)? no  4. What is your medication issue? Patient states he was taking half a tablet of 20mg  tablets of the medication. He states he thought he was going to switch to a whole pill of 10mg . Patient would like clarification.

## 2020-02-17 DIAGNOSIS — H35372 Puckering of macula, left eye: Secondary | ICD-10-CM | POA: Diagnosis not present

## 2020-02-17 DIAGNOSIS — H353122 Nonexudative age-related macular degeneration, left eye, intermediate dry stage: Secondary | ICD-10-CM | POA: Diagnosis not present

## 2020-02-17 DIAGNOSIS — H43813 Vitreous degeneration, bilateral: Secondary | ICD-10-CM | POA: Diagnosis not present

## 2020-02-17 DIAGNOSIS — H353221 Exudative age-related macular degeneration, left eye, with active choroidal neovascularization: Secondary | ICD-10-CM | POA: Diagnosis not present

## 2020-02-28 NOTE — Progress Notes (Signed)
Assessment/Plan:   1.  Parkinsons Disease  -Currently on no medication.  Has had DaTscan that was abnormal.  -We discussed that it used to be thought that levodopa would increase risk of melanoma but now it is believed that Parkinsons itself likely increases risk of melanoma. he is to get regular skin checks.   He does that.  -he asks questions about LBD.  He doesn't have that.  Reassured him  -discussed importance of exercise.  2 memory change  -Patient had neurocognitive testing in March, 2021 with Dr. Melvyn Novas which demonstrated mild cognitive impairment.  Subjective:   Walter Brown was seen today in follow up for Parkinsons disease.  My previous records were reviewed prior to todays visit as well as outside records available to me. Pt denies falls.  Pt denies lightheadedness, near syncope.  No hallucinations.  Mood has been good.  Patient's neurocognitive testing with Dr. Melvyn Novas is reviewed.  MCI was identified.  He went to [facility] 1 month but wanted to do that at home.  He has a bike and boxing bag at home.    Current prescribed movement disorder medications: None   PREVIOUS MEDICATIONS: none to date  ALLERGIES:   Allergies  Allergen Reactions  . Albuterol Sulfate Palpitations  . Azithromycin Other (See Comments)    Hepatotoxicity  . Doxycycline Hyclate Other (See Comments)    Taken with Azithromycin and had Heaptotoxicity    CURRENT MEDICATIONS:  Outpatient Encounter Medications as of 02/29/2020  Medication Sig  . amiodarone (PACERONE) 100 MG tablet Take 1 tablet (100 mg total) by mouth daily.  Marland Kitchen ELIQUIS 5 MG TABS tablet TAKE 1 TABLET BY MOUTH TWICE A DAY  . hydrochlorothiazide (HYDRODIURIL) 25 MG tablet TAKE 1 TABLET BY MOUTH EVERY DAY  . Multiple Vitamins-Minerals (ICAPS AREDS 2 PO) Take by mouth.  . rosuvastatin (CRESTOR) 10 MG tablet Take 1 tablet (10 mg total) by mouth daily.  . sertraline (ZOLOFT) 25 MG tablet TAKE 1 TABLET BY MOUTH EVERY DAY  . terazosin (HYTRIN) 10  MG capsule Take 20 mg by mouth at bedtime.   . [DISCONTINUED] hydrochlorothiazide (HYDRODIURIL) 25 MG tablet TAKE 1 TABLET BY MOUTH EVERY DAY (Patient taking differently: 12.5 mg. )   No facility-administered encounter medications on file as of 02/29/2020.    Objective:   PHYSICAL EXAMINATION:    VITALS:   Vitals:   02/29/20 1456  BP: 132/73  Pulse: 61  SpO2: 98%  Weight: 169 lb (76.7 kg)  Height: 5\' 5"  (1.651 m)   Wt Readings from Last 3 Encounters:  02/29/20 169 lb (76.7 kg)  11/23/19 170 lb (77.1 kg)  10/19/19 165 lb 9.6 oz (75.1 kg)     GEN:  The patient appears stated age and is in NAD. HEENT:  Normocephalic, atraumatic.  The mucous membranes are moist. The superficial temporal arteries are without ropiness or tenderness. CV:  RRR Lungs:  CTAB Neck/HEME:  There are no carotid bruits bilaterally.  Neurological examination:  Orientation: The patient is alert and oriented x3. Cranial nerves: There is good facial symmetry with facial hypomimia. The speech is fluent and clear. Soft palate rises symmetrically and there is no tongue deviation. Hearing is intact to conversational tone. Sensation: Sensation is intact to light touch throughout Motor: Strength is at least antigravity x4.  Movement examination: Tone: There is normal tone in the UE/LE Abnormal movements: there is rare RUE rest tremor Coordination:  There is  decremation with RAM's, on the R, overall very  mild Gait and Station: The patient has no difficulty arising out of a deep-seated chair without the use of the hands. The patient's stride length is good with decreased arm swing on the R.    I have reviewed and interpreted the following labs independently    Chemistry      Component Value Date/Time   NA 141 11/26/2019 1127   K 4.5 11/26/2019 1127   CL 104 11/26/2019 1127   CO2 24 11/26/2019 1127   BUN 12 11/26/2019 1127   CREATININE 1.16 11/26/2019 1127   CREATININE 1.13 02/22/2014 1006        Component Value Date/Time   CALCIUM 9.3 11/26/2019 1127   ALKPHOS 67 11/26/2019 1127   AST 20 11/26/2019 1127   ALT 17 11/26/2019 1127   BILITOT 0.8 11/26/2019 1127       Lab Results  Component Value Date   WBC 8.9 11/26/2019   HGB 13.9 11/26/2019   HCT 40.8 11/26/2019   MCV 95 11/26/2019   PLT 125 (L) 11/26/2019    Lab Results  Component Value Date   TSH 1.170 11/26/2019     Total time spent on today's visit was 25 minutes, including both face-to-face time and nonface-to-face time.  Time included that spent on review of records (prior notes available to me/labs/imaging if pertinent), discussing treatment and goals, answering patient's questions and coordinating care.  Cc:  [name], MD

## 2020-02-29 ENCOUNTER — Ambulatory Visit: Payer: Medicare HMO | Admitting: Neurology

## 2020-02-29 ENCOUNTER — Encounter: Payer: Self-pay | Admitting: Neurology

## 2020-02-29 ENCOUNTER — Other Ambulatory Visit: Payer: Self-pay | Admitting: Family Medicine

## 2020-02-29 ENCOUNTER — Other Ambulatory Visit: Payer: Self-pay

## 2020-02-29 VITALS — BP 132/73 | HR 61 | Ht 65.0 in | Wt 169.0 lb

## 2020-02-29 DIAGNOSIS — G20 Parkinson's disease: Secondary | ICD-10-CM

## 2020-02-29 NOTE — Patient Instructions (Signed)
1.  Follow up yearly with dermatology.  Parkinsons Disease increase risk for skin cancer  The physicians and staff at [facility] are committed to providing excellent care. You may receive a survey requesting feedback about your experience at our office. We strive to receive "very good" responses to the survey questions. If you feel that your experience would prevent you from giving the office a "very good " response, please contact our office to try to remedy the situation. We may be reached at [phone]. Thank you for taking the time out of your busy day to complete the survey.

## 2020-03-01 DIAGNOSIS — H353111 Nonexudative age-related macular degeneration, right eye, early dry stage: Secondary | ICD-10-CM | POA: Diagnosis not present

## 2020-03-01 DIAGNOSIS — H52203 Unspecified astigmatism, bilateral: Secondary | ICD-10-CM | POA: Diagnosis not present

## 2020-03-01 DIAGNOSIS — H2513 Age-related nuclear cataract, bilateral: Secondary | ICD-10-CM | POA: Diagnosis not present

## 2020-03-01 DIAGNOSIS — H353221 Exudative age-related macular degeneration, left eye, with active choroidal neovascularization: Secondary | ICD-10-CM | POA: Diagnosis not present

## 2020-03-27 ENCOUNTER — Ambulatory Visit: Payer: Medicare HMO | Admitting: Cardiology

## 2020-03-28 ENCOUNTER — Ambulatory Visit: Payer: Medicare HMO | Admitting: Cardiology

## 2020-03-31 ENCOUNTER — Ambulatory Visit: Payer: Medicare HMO | Admitting: Cardiology

## 2020-03-31 ENCOUNTER — Other Ambulatory Visit: Payer: Self-pay

## 2020-03-31 ENCOUNTER — Encounter: Payer: Self-pay | Admitting: Cardiology

## 2020-03-31 VITALS — BP 124/80 | HR 52 | Ht 65.0 in | Wt 168.0 lb

## 2020-03-31 DIAGNOSIS — I480 Paroxysmal atrial fibrillation: Secondary | ICD-10-CM

## 2020-03-31 DIAGNOSIS — I10 Essential (primary) hypertension: Secondary | ICD-10-CM | POA: Diagnosis not present

## 2020-03-31 DIAGNOSIS — E782 Mixed hyperlipidemia: Secondary | ICD-10-CM

## 2020-03-31 NOTE — Progress Notes (Signed)

## 2020-03-31 NOTE — Progress Notes (Signed)
Cardiology Office Note:    Date:  03/31/2020   ID:  Walter Brown, DOB 1940/10/11, MRN [id number]  PCP:  Walter Lukes, MD  Cardiologist:  Walter Lindau, MD   Referring MD: Walter Lukes, MD    ASSESSMENT:    1. Essential hypertension   2. Mixed hyperlipidemia   3. PAF (paroxysmal atrial fibrillation) (HCC)    PLAN:    In order of problems listed above:  1. Secondary prevention stressed with the patient.  Importance of compliance with diet medication stressed and he vocalized understanding. 2. Paroxysmal atrial fibrillation:I discussed with the patient atrial fibrillation, disease process. Management and therapy including rate and rhythm control, anticoagulation benefits and potential risks were discussed extensively with the patient. Patient had multiple questions which were answered to patient's satisfaction. 3. Mixed dyslipidemia: Diet was emphasized and patient will continue current medications.  TPN sheet was reviewed.  He will be back in the next few days for complete blood work including lipids. 4. Essential hypertension: Blood pressure stable and patient will continue current medications 5. Patient will be seen in follow-up appointment in 6 months or earlier if the patient has any concerns    Medication Adjustments/Labs and Tests Ordered: Current medicines are reviewed at length with the patient today.  Concerns regarding medicines are outlined above.  Orders Placed This Encounter  Procedures  . Basic metabolic panel  . CBC with Differential/Platelet  . Hepatic function panel  . Lipid panel  . TSH   No orders of the defined types were placed in this encounter.    Chief Complaint  Patient presents with  . Follow-up     History of Present Illness:    Walter Brown is a 79 y.o. male.  Patient has past medical history of paroxysmal defibrillation essential hypertension and dyslipidemia.  He denies any problems at this time and takes care of activities of  daily living.  No chest pain orthopnea or PND.  At the time of my evaluation, the patient is alert awake oriented and in no distress.  He exercises on a regular basis.  Past Medical History:  Diagnosis Date  . Anemia    mild  . Arthritis 04/06/2017  . Asthma    childhood  . ASVD (arteriosclerotic vascular disease) 09/07/2018  . Basal cell carcinoma    skin- on nose- basal cell (20 yrs ago) forehead 1 year ago  . BPH (benign prostatic hyperplasia)   . Bradycardia 06/28/2018  . Cerumen impaction 08/10/2012  . Chicken pox as child  . Coronary artery calcification 11/23/2019  . [place] measles as a child  . Hyperlipidemia   . Hypertension   . Irregular cardiac rhythm 03/10/2018  . Macular degeneration   . Major depressive disorder 08/01/2012  . Mild neurocognitive disorder due to Parkinson's disease (Walter Brown) 12/27/2019  . Obstructive sleep apnea 11/09/2018   Patient reports mild symptoms; was not prescribed a CPAP machine  . Otitis externa 08/10/2012  . PAF (paroxysmal atrial fibrillation)   . Parkinson's disease 05/19/2019  . Thrombocytopenia   . TIA (transient ischemic attack) 03/10/2018    Past Surgical History:  Procedure Laterality Date  . [place]     very young, b/l  . EXCISIONAL HEMORRHOIDECTOMY    . HYDROCELE EXCISION / REPAIR  2012   b/l  . SKIN CANCER EXCISION     nose and forehead, basal cell CA    Current Medications: Current Meds  Medication Sig  . amiodarone (PACERONE) 100 MG  tablet Take 1 tablet (100 mg total) by mouth daily.  Marland Kitchen ELIQUIS 5 MG TABS tablet TAKE 1 TABLET BY MOUTH TWICE A DAY  . hydrochlorothiazide (HYDRODIURIL) 25 MG tablet TAKE 1 TABLET BY MOUTH EVERY DAY  . Multiple Vitamins-Minerals (ICAPS AREDS 2 PO) Take by mouth.  . rosuvastatin (CRESTOR) 10 MG tablet Take 1 tablet (10 mg total) by mouth daily.  . sertraline (ZOLOFT) 25 MG tablet TAKE 1 TABLET BY MOUTH EVERY DAY  . terazosin (HYTRIN) 10 MG capsule Take 20 mg by mouth at bedtime.       Allergies:   Albuterol sulfate, Azithromycin, and Doxycycline hyclate   Social History   Socioeconomic History  . Marital status: Married    Spouse name: Not on file  . Number of children: 2  . Years of education: 16  . Highest education level: Bachelor's degree (e.g., BA, AB, BS)  Occupational History  . Occupation: retired  Tobacco Use  . Smoking status: Former Smoker    Packs/day: 1.50    Years: 15.00    Pack years: 22.50    Start date: 10/07/1978  . Smokeless tobacco: Never Used  Vaping Use  . Vaping Use: Never used  Substance and Sexual Activity  . Alcohol use: Yes    Alcohol/week: 7.0 - 14.0 standard drinks    Types: 7 - 14 Glasses of wine per week    Comment: 1-2 glasses of wine with dinner  . Drug use: No  . 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  Other Topics Concern  . Not on file  Social History Narrative  . Not on file   Social Determinants of Health   Financial Resource Strain:   . Difficulty of Paying Living Expenses:   Food Insecurity:   . Worried About Charity fundraiser in the Last Year:   . Arboriculturist in the Last Year:   Transportation Needs:   . Film/video editor (Medical):   Marland Kitchen Lack of Transportation (Non-Medical):   Physical Activity:   . Days of Exercise per Week:   . Minutes of Exercise per Session:   Stress:   . Feeling of Stress :   Social Connections:   . Frequency of Communication with Friends and Family:   . Frequency of Social Gatherings with Friends and Family:   . Attends Religious Services:   . Active Member of Clubs or Organizations:   . Attends Archivist Meetings:   Marland Kitchen Marital Status:      Family History: The patient's family history includes ADD / ADHD in his son; Cancer in his paternal grandfather; Heart disease in his father and mother; Hypertension in his father and mother; Other in his mother; Other  (age of onset: 65) in his son; Parkinson's disease in his father; Prostate cancer in his maternal grandfather; Prostate cancer (age of onset: 76) in his father; Stroke in his maternal grandmother.  ROS:   Please see the history of present illness.    All other systems reviewed and are negative.  EKGs/Labs/Other Studies Reviewed:    The following studies were reviewed today: I discussed my findings with the patient at extensive length   Recent Labs: 11/26/2019: ALT 17; BUN 12; Creatinine, Ser 1.16; Hemoglobin 13.9; Platelets 125; Potassium 4.5; Sodium 141; TSH 1.170  Recent Lipid Panel    Component Value Date/Time   CHOL 135 11/30/2019 0915  TRIG 76 11/30/2019 0915   HDL 77 11/30/2019 0915   CHOLHDL 1.8 11/30/2019 0915   CHOLHDL 2 07/20/2019 1433   VLDL 15.8 07/20/2019 1433   LDLCALC 43 11/30/2019 0915    Physical Exam:    VS:  BP 124/80   Pulse (!) 52   Ht 5\' 5"  (1.651 m)   Wt 168 lb (76.2 kg)   SpO2 98%   BMI 27.96 kg/m     Wt Readings from Last 3 Encounters:  03/31/20 168 lb (76.2 kg)  02/29/20 169 lb (76.7 kg)  11/23/19 170 lb (77.1 kg)     GEN: Patient is in no acute distress HEENT: Normal NECK: No JVD; No carotid bruits LYMPHATICS: No lymphadenopathy CARDIAC: Hear sounds regular, 2/6 systolic murmur at the apex. RESPIRATORY:  Clear to auscultation without rales, wheezing or rhonchi  ABDOMEN: Soft, non-tender, non-distended MUSCULOSKELETAL:  No edema; No deformity  SKIN: Warm and dry NEUROLOGIC:  Alert and oriented x 3 PSYCHIATRIC:  Normal affect   Signed, Walter Lindau, MD  03/31/2020 11:20 AM    [place]

## 2020-03-31 NOTE — Patient Instructions (Addendum)

## 2020-04-05 DIAGNOSIS — I10 Essential (primary) hypertension: Secondary | ICD-10-CM | POA: Diagnosis not present

## 2020-04-06 LAB — CBC WITH DIFFERENTIAL/PLATELET
Basophils Absolute: 0 10*3/uL (ref 0.0–0.2)
Basos: 1 %
EOS (ABSOLUTE): 0.1 10*3/uL (ref 0.0–0.4)
Eos: 1 %
Hematocrit: 40.8 % (ref 37.5–51.0)
Hemoglobin: 14.1 g/dL (ref 13.0–17.7)
Immature Grans (Abs): 0 10*3/uL (ref 0.0–0.1)
Immature Granulocytes: 1 %
Lymphocytes Absolute: 1.5 10*3/uL (ref 0.7–3.1)
Lymphs: 22 %
MCH: 32.8 pg (ref 26.6–33.0)
MCHC: 34.6 g/dL (ref 31.5–35.7)
MCV: 95 fL (ref 79–97)
Monocytes Absolute: 0.6 10*3/uL (ref 0.1–0.9)
Monocytes: 9 %
Neutrophils Absolute: 4.4 10*3/uL (ref 1.4–7.0)
Neutrophils: 66 %
Platelets: 118 10*3/uL — ABNORMAL LOW (ref 150–450)
RBC: 4.3 x10E6/uL (ref 4.14–5.80)
RDW: 12.7 % (ref 11.6–15.4)
WBC: 6.6 10*3/uL (ref 3.4–10.8)

## 2020-04-06 LAB — BASIC METABOLIC PANEL
BUN/Creatinine Ratio: 12 (ref 10–24)
BUN: 14 mg/dL (ref 8–27)
CO2: 26 mmol/L (ref 20–29)
Calcium: 9.2 mg/dL (ref 8.6–10.2)
Chloride: 104 mmol/L (ref 96–106)
Creatinine, Ser: 1.17 mg/dL (ref 0.76–1.27)
GFR calc Af Amer: 69 mL/min/{1.73_m2} (ref >59)
GFR calc non Af Amer: 59 mL/min/{1.73_m2} — ABNORMAL LOW (ref >59)
Glucose: 95 mg/dL (ref 65–99)
Potassium: 4.2 mmol/L (ref 3.5–5.2)
Sodium: 142 mmol/L (ref 134–144)

## 2020-04-06 LAB — LIPID PANEL
Chol/HDL Ratio: 1.8 ratio (ref 0.0–5.0)
Cholesterol, Total: 132 mg/dL (ref 100–199)
HDL: 72 mg/dL (ref >39)
LDL Chol Calc (NIH): 45 mg/dL (ref 0–99)
Triglycerides: 76 mg/dL (ref 0–149)
VLDL Cholesterol Cal: 15 mg/dL (ref 5–40)

## 2020-04-06 LAB — TSH: TSH: 1.88 u[IU]/mL (ref 0.450–4.500)

## 2020-04-06 LAB — HEPATIC FUNCTION PANEL
ALT: 13 IU/L (ref 0–44)
AST: 20 IU/L (ref 0–40)
Albumin: 4.1 g/dL (ref 3.7–4.7)
Alkaline Phosphatase: 64 IU/L (ref 48–121)
Bilirubin Total: 0.9 mg/dL (ref 0.0–1.2)
Bilirubin, Direct: 0.26 mg/dL (ref 0.00–0.40)
Total Protein: 6.2 g/dL (ref 6.0–8.5)

## 2020-04-13 DIAGNOSIS — H353221 Exudative age-related macular degeneration, left eye, with active choroidal neovascularization: Secondary | ICD-10-CM | POA: Diagnosis not present

## 2020-04-13 DIAGNOSIS — H353112 Nonexudative age-related macular degeneration, right eye, intermediate dry stage: Secondary | ICD-10-CM | POA: Diagnosis not present

## 2020-04-13 DIAGNOSIS — H35372 Puckering of macula, left eye: Secondary | ICD-10-CM | POA: Diagnosis not present

## 2020-04-13 DIAGNOSIS — H43821 Vitreomacular adhesion, right eye: Secondary | ICD-10-CM | POA: Diagnosis not present

## 2020-04-25 ENCOUNTER — Ambulatory Visit (INDEPENDENT_AMBULATORY_CARE_PROVIDER_SITE_OTHER): Payer: Medicare HMO | Admitting: Family Medicine

## 2020-04-25 ENCOUNTER — Other Ambulatory Visit: Payer: Self-pay

## 2020-04-25 DIAGNOSIS — R69 Illness, unspecified: Secondary | ICD-10-CM | POA: Diagnosis not present

## 2020-04-25 DIAGNOSIS — R002 Palpitations: Secondary | ICD-10-CM | POA: Diagnosis not present

## 2020-04-25 DIAGNOSIS — R739 Hyperglycemia, unspecified: Secondary | ICD-10-CM

## 2020-04-25 DIAGNOSIS — F339 Major depressive disorder, recurrent, unspecified: Secondary | ICD-10-CM

## 2020-04-25 DIAGNOSIS — G20 Parkinson's disease: Secondary | ICD-10-CM | POA: Diagnosis not present

## 2020-04-25 DIAGNOSIS — F329 Major depressive disorder, single episode, unspecified: Secondary | ICD-10-CM | POA: Insufficient documentation

## 2020-04-25 DIAGNOSIS — E782 Mixed hyperlipidemia: Secondary | ICD-10-CM

## 2020-04-25 DIAGNOSIS — I10 Essential (primary) hypertension: Secondary | ICD-10-CM | POA: Diagnosis not present

## 2020-04-25 DIAGNOSIS — D696 Thrombocytopenia, unspecified: Secondary | ICD-10-CM | POA: Diagnosis not present

## 2020-04-25 DIAGNOSIS — I480 Paroxysmal atrial fibrillation: Secondary | ICD-10-CM

## 2020-04-25 HISTORY — DX: Hyperglycemia, unspecified: R73.9

## 2020-04-25 NOTE — Assessment & Plan Note (Signed)
Asymptomatic and following with cardiology 

## 2020-04-25 NOTE — Assessment & Plan Note (Signed)
Doing better on Sertraline no changes

## 2020-04-25 NOTE — Assessment & Plan Note (Signed)
hgba1c acceptable, minimize simple carbs. Increase exercise as tolerated.  

## 2020-04-25 NOTE — Patient Instructions (Signed)

## 2020-04-25 NOTE — Assessment & Plan Note (Signed)
Stable, asymptomatic and longstanding no changes

## 2020-04-25 NOTE — Assessment & Plan Note (Signed)
Encouraged heart healthy diet, increase exercise, avoid trans fats, consider a krill oil cap daily 

## 2020-04-25 NOTE — Assessment & Plan Note (Addendum)
Stable and doing well, tremor is worsening but he can still manage his ADLs, following with neurology

## 2020-04-25 NOTE — Progress Notes (Signed)
Subjective:    Patient ID: Walter Brown, male    DOB: May 03, 1941, 79 y.o.   MRN: [id number]  Chief Complaint  Patient presents with  . Follow-up    HPI Patient is in today for follow up on chronic medical concerns. No recent febrile illness or hospitalizations. His Parkinson's Disease is stable and he is walking 2 miles a day and doing strength training several times a week as well. He is eating well and staying active. Denies CP/palp/SOB/HA/congestion/fevers/GI or GU c/o. Taking meds as prescribed  Past Medical History:  Diagnosis Date  . Anemia    mild  . Arthritis 04/06/2017  . Asthma    childhood  . ASVD (arteriosclerotic vascular disease) 09/07/2018  . Basal cell carcinoma    skin- on nose- basal cell (20 yrs ago) forehead 1 year ago  . BPH (benign prostatic hyperplasia)   . Bradycardia 06/28/2018  . Cerumen impaction 08/10/2012  . Chicken pox as child  . Coronary artery calcification 11/23/2019  . [place] measles as a child  . Hyperlipidemia   . Hypertension   . Irregular cardiac rhythm 03/10/2018  . Macular degeneration   . Major depressive disorder 08/01/2012  . Mild neurocognitive disorder due to Parkinson's disease ([place]) 12/27/2019  . Obstructive sleep apnea 11/09/2018   Patient reports mild symptoms; was not prescribed a CPAP machine  . Otitis externa 08/10/2012  . PAF (paroxysmal atrial fibrillation)   . Parkinson's disease 05/19/2019  . Thrombocytopenia   . TIA (transient ischemic attack) 03/10/2018    Past Surgical History:  Procedure Laterality Date  . [place]     very young, b/l  . EXCISIONAL HEMORRHOIDECTOMY    . HYDROCELE EXCISION / REPAIR  2012   b/l  . SKIN CANCER EXCISION     nose and forehead, basal cell CA    Family History  Problem Relation Age of Onset  . Hypertension Mother   . Other Mother        MRSA  . Heart disease Mother        stents  . Heart disease Father   . Hypertension Father   . Prostate cancer Father 45  .  Parkinson's disease Father   . Prostate cancer Maternal Grandfather   . ADD / ADHD Son        ADHD  . Other Son 24       part of 1 lung removed- due to infection  . Stroke Maternal Grandmother   . Cancer Paternal Grandfather        EYE    Social History   Socioeconomic History  . Marital status: Married    Spouse name: Not on file  . Number of children: 2  . Years of education: 16  . Highest education level: Bachelor's degree (e.g., BA, AB, BS)  Occupational History  . Occupation: retired  Tobacco Use  . Smoking status: Former Smoker    Packs/day: 1.50    Years: 15.00    Pack years: 22.50    Start date: 10/07/1978  . Smokeless tobacco: Never Used  Vaping Use  . Vaping Use: Never used  Substance and Sexual Activity  . Alcohol use: Yes    Alcohol/week: 7.0 - 14.0 standard drinks    Types: 7 - 14 Glasses of wine per week    Comment: 1-2 glasses of wine with dinner  . Drug use: No  . 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  Other Topics Concern  . Not on file  Social History Narrative  . Not on file   Social Determinants of Health   Financial Resource Strain:   . Difficulty of Paying Living Expenses:   Food Insecurity:   . Worried About Charity fundraiser in the Last Year:   . Arboriculturist in the Last Year:   Transportation Needs:   . Film/video editor (Medical):   Marland Kitchen Lack of Transportation (Non-Medical):   Physical Activity:   . Days of Exercise per Week:   . Minutes of Exercise per Session:   Stress:   . Feeling of Stress :   Social Connections:   . Frequency of Communication with Friends and Family:   . Frequency of Social Gatherings with Friends and Family:   . Attends Religious Services:   . Active Member of Clubs or Organizations:   . Attends Archivist Meetings:   Marland Kitchen Marital Status:   Intimate Partner Violence:   . Fear of Current or  Ex-Partner:   . Emotionally Abused:   Marland Kitchen Physically Abused:   . Sexually Abused:     Outpatient Medications Prior to Visit  Medication Sig Dispense Refill  . amiodarone (PACERONE) 100 MG tablet Take 1 tablet (100 mg total) by mouth daily. 90 tablet 0  . ELIQUIS 5 MG TABS tablet TAKE 1 TABLET BY MOUTH TWICE A DAY 180 tablet 1  . hydrochlorothiazide (HYDRODIURIL) 25 MG tablet TAKE 1 TABLET BY MOUTH EVERY DAY 90 tablet 1  . Multiple Vitamins-Minerals (ICAPS AREDS 2 PO) Take by mouth.    . rosuvastatin (CRESTOR) 10 MG tablet Take 1 tablet (10 mg total) by mouth daily. 90 tablet 3  . sertraline (ZOLOFT) 25 MG tablet TAKE 1 TABLET BY MOUTH EVERY DAY 90 tablet 1  . terazosin (HYTRIN) 10 MG capsule Take 20 mg by mouth at bedtime.      No facility-administered medications prior to visit.    Allergies  Allergen Reactions  . Albuterol Sulfate Palpitations  . Azithromycin Other (See Comments)    Hepatotoxicity  . Doxycycline Hyclate Other (See Comments)    Taken with Azithromycin and had Heaptotoxicity    Review of Systems  Constitutional: Negative for fever and malaise/fatigue.  HENT: Negative for congestion.   Eyes: Negative for blurred vision.  Respiratory: Negative for shortness of breath.   Cardiovascular: Negative for chest pain, palpitations and leg swelling.  Gastrointestinal: Negative for abdominal pain, blood in stool and nausea.  Genitourinary: Negative for dysuria and frequency.  Musculoskeletal: Negative for falls.  Skin: Negative for rash.  Neurological: Positive for tremors. Negative for dizziness, loss of consciousness and headaches.  Endo/Heme/Allergies: Negative for environmental allergies.  Psychiatric/Behavioral: Negative for depression. The patient is not nervous/anxious.        Objective:    Physical Exam Vitals and nursing note reviewed.  Constitutional:      General: He is not in acute distress.    Appearance: He is well-developed.  HENT:     Head:  Normocephalic and atraumatic.     Nose: Nose normal.  Eyes:     General:        Right eye: No discharge.        Left eye: No discharge.  Cardiovascular:     Rate and Rhythm: Normal rate and regular rhythm.     Heart sounds: No murmur heard.   Pulmonary:     Effort: Pulmonary effort  is normal.     Breath sounds: Normal breath sounds.  Abdominal:     General: Bowel sounds are normal.     Palpations: Abdomen is soft.     Tenderness: There is no abdominal tenderness.  Musculoskeletal:     Cervical back: Normal range of motion and neck supple.  Skin:    General: Skin is warm and dry.  Neurological:     Mental Status: He is alert and oriented to person, place, and time.     BP 116/80 (BP Location: Right Arm, Cuff Size: Normal)   Pulse 62   Temp 97.6 F (36.4 C) (Oral)   Resp 12   Ht 5\' 5"  (1.651 m)   Wt 170 lb 12.8 oz (77.5 kg)   SpO2 98%   BMI 28.42 kg/m  Wt Readings from Last 3 Encounters:  04/25/20 170 lb 12.8 oz (77.5 kg)  03/31/20 168 lb (76.2 kg)  02/29/20 169 lb (76.7 kg)    Diabetic Foot Exam - Simple   No data filed     Lab Results  Component Value Date   WBC 6.6 04/05/2020   HGB 14.1 04/05/2020   HCT 40.8 04/05/2020   PLT 118 (L) 04/05/2020   GLUCOSE 95 04/05/2020   CHOL 132 04/05/2020   TRIG 76 04/05/2020   HDL 72 04/05/2020   LDLCALC 45 04/05/2020   ALT 13 04/05/2020   AST 20 04/05/2020   NA 142 04/05/2020   K 4.2 04/05/2020   CL 104 04/05/2020   CREATININE 1.17 04/05/2020   BUN 14 04/05/2020   CO2 26 04/05/2020   TSH 1.880 04/05/2020   INR 1.0 12/15/2017    Lab Results  Component Value Date   TSH 1.880 04/05/2020   Lab Results  Component Value Date   WBC 6.6 04/05/2020   HGB 14.1 04/05/2020   HCT 40.8 04/05/2020   MCV 95 04/05/2020   PLT 118 (L) 04/05/2020   Lab Results  Component Value Date   NA 142 04/05/2020   K 4.2 04/05/2020   CO2 26 04/05/2020   GLUCOSE 95 04/05/2020   BUN 14 04/05/2020   CREATININE 1.17 04/05/2020     BILITOT 0.9 04/05/2020   ALKPHOS 64 04/05/2020   AST 20 04/05/2020   ALT 13 04/05/2020   PROT 6.2 04/05/2020   ALBUMIN 4.1 04/05/2020   CALCIUM 9.2 04/05/2020   ANIONGAP 12 08/27/2018   GFR 73.06 07/20/2019   Lab Results  Component Value Date   CHOL 132 04/05/2020   Lab Results  Component Value Date   HDL 72 04/05/2020   Lab Results  Component Value Date   LDLCALC 45 04/05/2020   Lab Results  Component Value Date   TRIG 76 04/05/2020   Lab Results  Component Value Date   CHOLHDL 1.8 04/05/2020   No results found for: HGBA1C     Assessment & Plan:   Problem List Items Addressed This Visit    Thrombocytopenia ([place])    Stable, asymptomatic and longstanding no changes      Hypertension    Well controlled, no changes to meds. Encouraged heart healthy diet such as the DASH diet and exercise as tolerated.       Palpitations    No recent flares      PAF (paroxysmal atrial fibrillation) (HCC)    Asymptomatic and following with cardiology      Parkinson's disease ([place])    Stable and doing well, tremor is worsening but he can still manage  his ADLs, following with neurology      Depression, recurrent ([place])    Doing better on Sertraline no changes         I am having Daryon O. Hunley maintain his terazosin, Multiple Vitamins-Minerals (ICAPS AREDS 2 PO), amiodarone, sertraline, Eliquis, rosuvastatin, and hydrochlorothiazide.  No orders of the defined types were placed in this encounter.    Penni Homans, MD

## 2020-04-25 NOTE — Assessment & Plan Note (Signed)
Well controlled, no changes to meds. Encouraged heart healthy diet such as the DASH diet and exercise as tolerated.  °

## 2020-04-25 NOTE — Assessment & Plan Note (Signed)
No recent flares 

## 2020-05-17 DIAGNOSIS — M47812 Spondylosis without myelopathy or radiculopathy, cervical region: Secondary | ICD-10-CM | POA: Diagnosis not present

## 2020-05-29 DIAGNOSIS — R69 Illness, unspecified: Secondary | ICD-10-CM | POA: Diagnosis not present

## 2020-06-06 DIAGNOSIS — N401 Enlarged prostate with lower urinary tract symptoms: Secondary | ICD-10-CM | POA: Diagnosis not present

## 2020-06-06 DIAGNOSIS — R351 Nocturia: Secondary | ICD-10-CM | POA: Diagnosis not present

## 2020-06-15 DIAGNOSIS — H353122 Nonexudative age-related macular degeneration, left eye, intermediate dry stage: Secondary | ICD-10-CM | POA: Diagnosis not present

## 2020-06-15 DIAGNOSIS — H353221 Exudative age-related macular degeneration, left eye, with active choroidal neovascularization: Secondary | ICD-10-CM | POA: Diagnosis not present

## 2020-06-15 DIAGNOSIS — H35372 Puckering of macula, left eye: Secondary | ICD-10-CM | POA: Diagnosis not present

## 2020-06-15 DIAGNOSIS — H43821 Vitreomacular adhesion, right eye: Secondary | ICD-10-CM | POA: Diagnosis not present

## 2020-06-20 ENCOUNTER — Other Ambulatory Visit: Payer: Self-pay | Admitting: Family

## 2020-06-22 DIAGNOSIS — R69 Illness, unspecified: Secondary | ICD-10-CM | POA: Diagnosis not present

## 2020-07-01 ENCOUNTER — Other Ambulatory Visit: Payer: Self-pay | Admitting: Family Medicine

## 2020-07-04 ENCOUNTER — Telehealth: Payer: Self-pay | Admitting: Family Medicine

## 2020-07-04 NOTE — Progress Notes (Signed)
  Chronic Care Management   Note  07/04/2020 Name: NYQUAN SELBE MRN: [id number] DOB: 1940-11-16  TREVAUN RENDLEMAN is a 79 y.o. year old male who is a primary care patient of [name], MD. I reached out to Demetrios Loll by phone today in response to a referral sent by Mr. Judeth Cornfield PCP, [name], MD.   Mr. Hayhurst was given information about Chronic Care Management services today including:  1. CCM service includes personalized support from designated clinical staff supervised by his physician, including individualized plan of care and coordination with other care providers 2. 24/7 contact phone numbers for assistance for urgent and routine care needs. 3. Service will only be billed when office clinical staff spend 20 minutes or more in a month to coordinate care. 4. Only one practitioner may furnish and bill the service in a calendar month. 5. The patient may stop CCM services at any time (effective at the end of the month) by phone call to the office staff.   Patient agreed to services and verbal consent obtained.   Follow up plan:   Carley Perdue [employer] Scheduler

## 2020-07-25 DIAGNOSIS — Z85828 Personal history of other malignant neoplasm of skin: Secondary | ICD-10-CM | POA: Diagnosis not present

## 2020-07-25 DIAGNOSIS — D225 Melanocytic nevi of trunk: Secondary | ICD-10-CM | POA: Diagnosis not present

## 2020-07-25 DIAGNOSIS — L821 Other seborrheic keratosis: Secondary | ICD-10-CM | POA: Diagnosis not present

## 2020-07-25 DIAGNOSIS — L814 Other melanin hyperpigmentation: Secondary | ICD-10-CM | POA: Diagnosis not present

## 2020-07-25 DIAGNOSIS — Z86018 Personal history of other benign neoplasm: Secondary | ICD-10-CM | POA: Diagnosis not present

## 2020-07-25 DIAGNOSIS — L578 Other skin changes due to chronic exposure to nonionizing radiation: Secondary | ICD-10-CM | POA: Diagnosis not present

## 2020-07-25 DIAGNOSIS — L570 Actinic keratosis: Secondary | ICD-10-CM | POA: Diagnosis not present

## 2020-08-04 ENCOUNTER — Other Ambulatory Visit: Payer: Self-pay | Admitting: Cardiology

## 2020-08-04 DIAGNOSIS — R69 Illness, unspecified: Secondary | ICD-10-CM | POA: Diagnosis not present

## 2020-08-17 DIAGNOSIS — I480 Paroxysmal atrial fibrillation: Secondary | ICD-10-CM

## 2020-08-17 DIAGNOSIS — R002 Palpitations: Secondary | ICD-10-CM

## 2020-08-17 NOTE — Addendum Note (Signed)
Addended by: Truddie Hidden on: 08/17/2020 05:17 PM   Modules accepted: Orders

## 2020-08-20 ENCOUNTER — Encounter: Payer: Self-pay | Admitting: Family Medicine

## 2020-08-21 ENCOUNTER — Telehealth: Payer: Self-pay

## 2020-08-21 ENCOUNTER — Other Ambulatory Visit: Payer: Self-pay | Admitting: Family Medicine

## 2020-08-21 ENCOUNTER — Telehealth: Payer: Self-pay | Admitting: Cardiology

## 2020-08-21 DIAGNOSIS — I10 Essential (primary) hypertension: Secondary | ICD-10-CM

## 2020-08-21 DIAGNOSIS — R739 Hyperglycemia, unspecified: Secondary | ICD-10-CM

## 2020-08-21 DIAGNOSIS — E782 Mixed hyperlipidemia: Secondary | ICD-10-CM

## 2020-08-21 DIAGNOSIS — I499 Cardiac arrhythmia, unspecified: Secondary | ICD-10-CM

## 2020-08-21 NOTE — Telephone Encounter (Signed)
I called pt to see if he would like to come in today at 4pm with Dr.Blyth for an EKG.

## 2020-08-21 NOTE — Telephone Encounter (Signed)
Called pt from Referral work ques.  pcp office send over another referral because pt would like a 2nd option.  She is currently seeing DR Revankar and would like to switch to Dr Toney Reil number [phone]

## 2020-08-22 ENCOUNTER — Telehealth: Payer: Self-pay | Admitting: Cardiology

## 2020-08-22 NOTE — Telephone Encounter (Signed)
That’s fine with me

## 2020-08-22 NOTE — Telephone Encounter (Signed)
ok 

## 2020-08-22 NOTE — Telephone Encounter (Signed)
New Message:    Pt would like to switch from Dr Geraldo Pitter to Dr Gwenlyn Found if this alright with them please.

## 2020-08-23 ENCOUNTER — Other Ambulatory Visit: Payer: Self-pay

## 2020-08-23 ENCOUNTER — Telehealth: Payer: Self-pay | Admitting: Cardiovascular Disease

## 2020-08-23 DIAGNOSIS — E782 Mixed hyperlipidemia: Secondary | ICD-10-CM

## 2020-08-23 DIAGNOSIS — I10 Essential (primary) hypertension: Secondary | ICD-10-CM

## 2020-08-23 DIAGNOSIS — I480 Paroxysmal atrial fibrillation: Secondary | ICD-10-CM

## 2020-08-23 DIAGNOSIS — R739 Hyperglycemia, unspecified: Secondary | ICD-10-CM

## 2020-08-23 NOTE — Telephone Encounter (Signed)
Spoke with the patient. He stated he was switching from Dr. Geraldo Pitter to Dr. Gwenlyn Found. He was calling to see if he could have a stress test ordered. He has an appointment on 12/22 with Almyra Deforest, PA. He was advised that he will need to be seen for his initial appointment and evaluated to see if a stress test was needed. He stated that he was told that Almyra Deforest, [place] was going order one prior to the visit. Message will be routed.

## 2020-08-23 NOTE — Telephone Encounter (Signed)
Patient's son requested orders for patient to have a stress test and lab work. He stated we can return the call to the patient to schedule.

## 2020-08-24 ENCOUNTER — Other Ambulatory Visit: Payer: Self-pay | Admitting: Physician Assistant

## 2020-08-24 ENCOUNTER — Telehealth: Payer: Self-pay | Admitting: Radiology

## 2020-08-24 DIAGNOSIS — H35372 Puckering of macula, left eye: Secondary | ICD-10-CM | POA: Diagnosis not present

## 2020-08-24 DIAGNOSIS — H43813 Vitreous degeneration, bilateral: Secondary | ICD-10-CM | POA: Diagnosis not present

## 2020-08-24 DIAGNOSIS — H353221 Exudative age-related macular degeneration, left eye, with active choroidal neovascularization: Secondary | ICD-10-CM | POA: Diagnosis not present

## 2020-08-24 DIAGNOSIS — H353112 Nonexudative age-related macular degeneration, right eye, intermediate dry stage: Secondary | ICD-10-CM | POA: Diagnosis not present

## 2020-08-24 MED ORDER — AMIODARONE HCL 200 MG PO TABS: 200.0000 mg | ORAL_TABLET | Freq: Every day | ORAL | 1 refills | Status: DC

## 2020-08-24 NOTE — Telephone Encounter (Signed)
I have reached out alcohol with the patient.  He denies any recent chest pain.  I do not think he necessarily need a stress test.  He has been having multiple episodes of tachycardia on his apple watch.  Unfortunately since he has no cardiac awareness, he was unable to feel the rhythm and obtain a strip.  Dr. Julien Nordmann office previously ordered a heart monitor, however he canceled it as he was switching cardiologist.  I also recommended a 2-week ZIO monitor in this case to assess for recurrence of atrial fibrillation.  He is not on any rate control agent, he is compliant with Eliquis and amiodarone 100 mg daily.  I recommended increasing his amiodarone to 200 mg daily.  I plan to reevaluate the patient on 09/27/2020, at which time I likely will order echocardiogram to assess his LA size.

## 2020-08-24 NOTE — Telephone Encounter (Signed)
Enrolled patient for a 14 day Zio XT  monitor to be mailed to patients home  °

## 2020-08-25 ENCOUNTER — Telehealth: Payer: Self-pay | Admitting: Pharmacist

## 2020-08-25 NOTE — Progress Notes (Signed)
Chronic Care Management Pharmacy Assistant   Name: Walter Brown  MRN: [id number] DOB: 04-Jan-1941  Reason for Encounter: Initial Questions   PCP : [name], MD  Allergies:   Allergies  Allergen Reactions  . Albuterol Sulfate Palpitations  . Azithromycin Other (See Comments)    Hepatotoxicity  . Doxycycline Hyclate Other (See Comments)    Taken with Azithromycin and had Heaptotoxicity    Medications: Outpatient Encounter Medications as of 08/25/2020  Medication Sig  . amiodarone (PACERONE) 200 MG tablet Take 1 tablet (200 mg total) by mouth daily.  Marland Kitchen ELIQUIS 5 MG TABS tablet TAKE 1 TABLET BY MOUTH TWICE A DAY  . hydrochlorothiazide (HYDRODIURIL) 25 MG tablet TAKE 1 TABLET BY MOUTH EVERY DAY  . Multiple Vitamins-Minerals (ICAPS AREDS 2 PO) Take by mouth.  . rosuvastatin (CRESTOR) 10 MG tablet Take 1 tablet (10 mg total) by mouth daily.  . sertraline (ZOLOFT) 25 MG tablet TAKE 1 TABLET BY MOUTH EVERY DAY  . terazosin (HYTRIN) 10 MG capsule Take 20 mg by mouth at bedtime.    No facility-administered encounter medications on file as of 08/25/2020.    Current Diagnosis: Patient Active Problem List   Diagnosis Date Noted  . Depression, recurrent ([place]) 04/25/2020  . Hyperglycemia 04/25/2020  . Mild neurocognitive disorder due to Parkinson's disease ([place]) 12/27/2019  . Coronary artery calcification 11/23/2019  . Parkinson's disease ([place]) 05/19/2019  . Obstructive sleep apnea 11/09/2018  . PAF (paroxysmal atrial fibrillation) ([place]) 09/09/2018  . Palpitations 09/07/2018  . ASVD (arteriosclerotic vascular disease) 09/07/2018  . Lipoma 08/04/2018  . Bradycardia 06/28/2018  . Irregular cardiac rhythm 03/10/2018  . TIA (transient ischemic attack) 03/10/2018  . Grade II hemorrhoids 09/16/2017  . Arthritis 04/06/2017  . Macular degeneration 03/03/2015  . Right shoulder pain 09/26/2014  . Back pain with radiation 04/26/2013  . Cerumen impaction 08/10/2012  . Otitis  externa 08/10/2012  . Major depressive disorder 08/01/2012  . Anemia   . Asthma   . Hyperlipidemia   . Hypertension   . BPH (benign prostatic hyperplasia)   . Cancer ([place])   . Thrombocytopenia ([place])     Goals Addressed   None     Have you seen any other providers since your last visit? Urologist and Rheumatologist    Any changes in your medications or health? None recently   Any side effects from any medications? Feels that his thigh muscles are weaker from possible statin use.   Do you have an symptoms or problems not managed by your medications? None    Any concerns about your health right now? None   Has your provider asked that you check blood pressure, blood sugar, or follow special diet at home? Patient states he checks his blood pressures daily.   Do you get any type of exercise on a regular basis? Walks 4-5 miles a week with his wife   Can you think of a goal you would like to reach for your health? Patient states he would like lose ten pounds   Do you have any problems getting your medications? None   Is there anything that you would like to discuss during the appointment? Patient would like review possible side effects of long medications use for some of his prescriptions.  Reminded the patient to please have his medications and supplements at the time of his appointment on Monday November 22nd at 9:00am over the telephone.   Follow-Up:  Pharmacist Review   Fanny Skates, CMA Clinical  Pharmacist Assistant [phone]

## 2020-08-28 ENCOUNTER — Ambulatory Visit: Payer: Medicare HMO | Admitting: Pharmacist

## 2020-08-28 ENCOUNTER — Other Ambulatory Visit: Payer: Self-pay

## 2020-08-28 ENCOUNTER — Other Ambulatory Visit: Payer: Self-pay | Admitting: Cardiology

## 2020-08-28 ENCOUNTER — Telehealth: Payer: Self-pay | Admitting: Pharmacist

## 2020-08-28 DIAGNOSIS — F339 Major depressive disorder, recurrent, unspecified: Secondary | ICD-10-CM

## 2020-08-28 DIAGNOSIS — E782 Mixed hyperlipidemia: Secondary | ICD-10-CM

## 2020-08-28 DIAGNOSIS — I480 Paroxysmal atrial fibrillation: Secondary | ICD-10-CM

## 2020-08-28 DIAGNOSIS — I10 Essential (primary) hypertension: Secondary | ICD-10-CM

## 2020-08-28 MED ORDER — AMIODARONE HCL 200 MG PO TABS: 200.0000 mg | ORAL_TABLET | Freq: Every day | ORAL | 3 refills | Status: DC

## 2020-08-28 MED ORDER — ROSUVASTATIN CALCIUM 10 MG PO TABS: 10.0000 mg | ORAL_TABLET | Freq: Every day | ORAL | 3 refills | Status: DC

## 2020-08-28 MED ORDER — APIXABAN 5 MG PO TABS: 5.0000 mg | ORAL_TABLET | Freq: Two times a day (BID) | ORAL | 3 refills | Status: DC

## 2020-08-28 NOTE — Patient Instructions (Signed)
Visit Information  Goals Addressed            This Visit's Progress   . Chronic Care Management Pharmacy Care Plan       CARE PLAN ENTRY (see longitudinal plan of care for additional care plan information)  Current Barriers:  . Chronic Disease Management support, education, and care coordination needs related to Hypertension, Hyperlipidemia/TIA, AFib, Depression, BPH, Parkinson's Disease   Hypertension BP Readings from Last 3 Encounters:  04/25/20 116/80  03/31/20 124/80  02/29/20 132/73   . Pharmacist Clinical Goal(s): o Over the next 90 days, patient will work with PharmD and providers to maintain BP goal <130/80 . Current regimen:  o HCTZ 25mg  daily . Interventions: o Recommended patient move HCTZ to morning versus bedtime to reduce overnight urination.  o Discussed BP goal o Requested pt record BG readings  . Patient self care activities - Over the next 90 days, patient will: o Check BP 1-2 times per week, document, and provide at future appointments o Ensure daily salt intake < 2300 mg/Brysten Reister o Move HCTZ dose to morning instead of bedtime  Hyperlipidemia Lab Results  Component Value Date/Time   LDLCALC 45 04/05/2020 08:49 AM   . Pharmacist Clinical Goal(s): o Over the next 90 days, patient will work with PharmD and providers to maintain LDL goal < 70 . Current regimen:  . Rosuvastatin 10mg  daily  . CoQ10 100mg  daily . Interventions: o Discussed LDL goal o Discussed how Eliquis prevents strokes in comparison to statins . Patient self care activities - Over the next 90 days, patient will: o Maintain cholesterol medication regimen.   AFib . Pharmacist Clinical Goal(s) o Over the next 90 days, patient will work with PharmD and providers to reduce risk of stroke due to Afib . Current regimen:   Amiodarone 200mg  daily  Eliquis 5mg  twice daily . Interventions: o Discussed how Eliquis prevents strokes in comparison to statins . Patient self care activities - Over  the next 90 days, patient will: o Maintain Afib medication regimen  Depression . Pharmacist Clinical Goal(s) o Over the next 90 days, patient will work with PharmD and providers to reduce symptoms associated with depression . Current regimen:  o Sertraline 25mg  daily . Interventions: o Discussed option of speaking to counselor regarding feelings about Parkinson's disease . Patient self care activities - Over the next 90 days, patient will: o Consider accepting referral to speak to someone regarding his feelings about his Parkinson's diagnosis   Medication management . Pharmacist Clinical Goal(s): o Over the next 90 days, patient will work with PharmD and providers to achieve optimal medication adherence . Current [facility] . Interventions o Comprehensive medication review performed. o Utilize [facility] pharmacy for medication synchronization, packaging and delivery . Patient self care activities - Over the next 90 days, patient will: o Focus on medication adherence by filling and taking medications appropriately  o Take medications as prescribed o Report any questions or concerns to PharmD and/or provider(s)  Initial goal documentation        [name] was given information about Chronic Care Management services today including:  1. CCM service includes personalized support from designated clinical staff supervised by his physician, including individualized plan of care and coordination with other care providers 2. 24/7 contact phone numbers for assistance for urgent and routine care needs. 3. Standard insurance, coinsurance, copays and deductibles apply for chronic care management only during months in which we provide at least 20 minutes of  these services. Most insurances cover these services at 100%, however patients may be responsible for any copay, coinsurance and/or deductible if applicable. This service may help you avoid the need for more expensive  face-to-face services. 4. Only one practitioner may furnish and bill the service in a calendar month. 5. The patient may stop CCM services at any time (effective at the end of the month) by phone call to the office staff.  Patient agreed to services and verbal consent obtained.   The patient verbalized understanding of instructions, educational materials, and care plan provided today and agreed to receive a mailed copy of patient instructions, educational materials, and care plan.  Telephone follow up appointment with pharmacy team member scheduled for: 11/29/2020  De Blanch, PharmD Clinical Pharmacist [facility] [phone]   How to Take Your Blood Pressure You can take your blood pressure at home with a machine. You may need to check your blood pressure at home:  To check if you have high blood pressure (hypertension).  To check your blood pressure over time.  To make sure your blood pressure medicine is working. Supplies needed: You will need a blood pressure machine, or monitor. You can buy one at a drugstore or online. When choosing one:  Choose one with an arm cuff.  Choose one that wraps around your upper arm. Only one finger should fit between your arm and the cuff.  Do not choose one that measures your blood pressure from your wrist or finger. Your doctor can suggest a monitor. How to prepare Avoid these things for 30 minutes before checking your blood pressure:  Drinking caffeine.  Drinking alcohol.  Eating.  Smoking.  Exercising. Five minutes before checking your blood pressure:  Pee.  Sit in a dining chair. Avoid sitting in a soft couch or armchair.  Be quiet. Do not talk. How to take your blood pressure Follow the instructions that came with your machine. If you have a digital blood pressure monitor, these may be the instructions: 1. Sit up straight. 2. Place your feet on the floor. Do not cross your ankles or  legs. 3. Rest your left arm at the level of your heart. You may rest it on a table, desk, or chair. 4. Pull up your shirt sleeve. 5. Wrap the blood pressure cuff around the upper part of your left arm. The cuff should be 1 inch (2.5 cm) above your elbow. It is best to wrap the cuff around bare skin. 6. Fit the cuff snugly around your arm. You should be able to place only one finger between the cuff and your arm. 7. Put the cord inside the groove of your elbow. 8. Press the power button. 9. Sit quietly while the cuff fills with air and loses air. 10. Write down the numbers on the screen. 11. Wait 2-3 minutes and then repeat steps 1-10. What do the numbers mean? Two numbers make up your blood pressure. The first number is called systolic pressure. The second is called diastolic pressure. An example of a blood pressure reading is "120 over 80" (or 120/80). If you are an adult and do not have a medical condition, use this guide to find out if your blood pressure is normal: Normal  First number: below 120.  Second number: below 80. Elevated  First number: 120-129.  Second number: below 80. Hypertension stage 1  First number: 130-139.  Second number: 80-89. Hypertension stage 2  First number: 140 or above.  Second number: 11 or above. Your blood pressure is above normal even if only the top or bottom number is above normal. Follow these instructions at home:  Check your blood pressure as often as your doctor tells you to.  Take your monitor to your next doctor's appointment. Your doctor will: ? Make sure you are using it correctly. ? Make sure it is working right.  Make sure you understand what your blood pressure numbers should be.  Tell your doctor if your medicines are causing side effects. Contact a doctor if:  Your blood pressure keeps being high. Get help right away if:  Your first blood pressure number is higher than 180.  Your second blood pressure number is  higher than 120. This information is not intended to replace advice given to you by your health care provider. Make sure you discuss any questions you have with your health care provider. Document Revised: 09/05/2017 Document Reviewed: 03/01/2016 Elsevier Patient Education  2020 [employer].

## 2020-08-28 NOTE — Chronic Care Management (AMB) (Signed)
Chronic Care Management Pharmacy  Name: Walter Brown  MRN: [id number] DOB: 10-11-1940  Chief Complaint/ HPI  Walter Brown,  79 y.o. , male presents for their Initial CCM visit with the clinical pharmacist via telephone due to COVID-19 Pandemic.  PCP : Walter Lukes, MD  Their chronic conditions include: Hypertension, Hyperlipidemia/TIA, AFib, Depression, BPH, Parkinson's Disease  Office Visits: 04/25/20: Visit w/ Dr. Charlett Brown -  No med changes noted.   Consult Visit: 08/24/20: Pt mailed a 14 Walter Brown Zio XT monitor from cardio  08/23/20: Message from Walter Brown - Recommendation for Zio monitor and increase in amiodarone to 250m daily due to multiple episodes of tachycardia detected by apple watch.  08/22/20: Message for patient request to switch from Dr. RGeraldo Pitterto Dr. BGwenlyn Brown    03/31/20: Cardio visit w/ Dr. RGeraldo Brown- No med changes noted. RTC 6 months.   02/29/20: Neuro visit w/ Dr. TCarles Brown- No med changes noted. RTC 8 months.   Medications: Outpatient Encounter Medications as of 08/28/2020  Medication Sig  . amiodarone (PACERONE) 200 MG tablet Take 1 tablet (200 mg total) by mouth daily.  .Marland KitchenELIQUIS 5 MG TABS tablet TAKE 1 TABLET BY MOUTH TWICE A [name]  . hydrochlorothiazide (HYDRODIURIL) 25 MG tablet TAKE 1 TABLET BY MOUTH EVERY Camelia Stelzner  . Multiple Vitamins-Minerals (ICAPS AREDS 2 PO) Take by mouth.  . rosuvastatin (CRESTOR) 10 MG tablet Take 1 tablet (10 mg total) by mouth daily.  . sertraline (ZOLOFT) 25 MG tablet TAKE 1 TABLET BY MOUTH EVERY [name]  . terazosin (HYTRIN) 10 MG capsule Take 10 mg by mouth at bedtime.    No facility-administered encounter medications on file as of 08/28/2020.   SDOH Screenings   Alcohol Screen:   . Last Alcohol Screening Score (AUDIT): Not on file  Depression (PHQ2-9): Medium Risk  . PHQ-2 Score: 11  Financial Resource Strain:   . Difficulty of Paying Living Expenses: Not on file  Food Insecurity:   . Worried About [name]: Not on file  . Ran Out of Food in the Last Year: Not on file  Housing:   . Last Housing Risk Score: Not on file  Physical Activity:   . Days of Exercise per Week: Not on file  . Minutes of Exercise per Session: Not on file  Social Connections:   . Frequency of Communication with Friends and Family: Not on file  . Frequency of Social Gatherings with Friends and Family: Not on file  . Attends Religious Services: Not on file  . Active Member of Clubs or Organizations: Not on file  . Attends CArchivistMeetings: Not on file  . Marital Status: Not on file  Stress:   . Feeling of Stress : Not on file  Tobacco Use: Medium Risk  . Smoking Tobacco Use: Former Smoker  . Smokeless Tobacco Use: Never Used  Transportation Needs:   . LFilm/video editor(Medical): Not on file  . Lack of Transportation (Non-Medical): Not on file     Current Diagnosis/Assessment:  Goals Addressed            This Visit's Progress   . Chronic Care Management Pharmacy Care Plan       CARE PLAN ENTRY (see longitudinal plan of care for additional care plan information)  Current Barriers:  . Chronic Disease Management support, education, and care coordination needs related to Hypertension, Hyperlipidemia/TIA, AFib, Depression, BPH, Parkinson's Disease   Hypertension  BP Readings from Last 3 Encounters:  04/25/20 116/80  03/31/20 124/80  02/29/20 132/73   . Pharmacist Clinical Goal(s): o Over the next 90 days, patient will work with PharmD and providers to maintain BP goal <130/80 . Current regimen:  o HCTZ $Remo'25mg'zqIRh$  daily . Interventions: o Recommended patient move HCTZ to morning versus bedtime to reduce overnight urination.  o Discussed BP goal o Requested pt record BG readings  . Patient self care activities - Over the next 90 days, patient will: o Check BP 1-2 times per week, document, and provide at future appointments o Ensure daily salt intake < 2300 mg/[name] o Move HCTZ dose  to morning instead of bedtime  Hyperlipidemia Lab Results  Component Value Date/Time   LDLCALC 45 04/05/2020 08:49 AM   . Pharmacist Clinical Goal(s): o Over the next 90 days, patient will work with PharmD and providers to maintain LDL goal < 70 . Current regimen:  . Rosuvastatin $RemoveBefore'10mg'axAirnDQZsGJZ$  daily  . CoQ10 $Remov'100mg'qYpFDN$  daily . Interventions: o Discussed LDL goal o Discussed how Eliquis prevents strokes in comparison to statins . Patient self care activities - Over the next 90 days, patient will: o Maintain cholesterol medication regimen.   AFib . Pharmacist Clinical Goal(s) o Over the next 90 days, patient will work with PharmD and providers to reduce risk of stroke due to Afib . Current regimen:   Amiodarone $RemoveBef'200mg'ghTQmwEDnz$  daily  Eliquis $Remove'5mg'ZVHuHfc$  twice daily . Interventions: o Discussed how Eliquis prevents strokes in comparison to statins . Patient self care activities - Over the next 90 days, patient will: o Maintain Afib medication regimen  Depression . Pharmacist Clinical Goal(s) o Over the next 90 days, patient will work with PharmD and providers to reduce symptoms associated with depression . Current regimen:  o Sertraline $RemoveBefo'25mg'QtyfAkbvIJY$  daily . Interventions: o Discussed option of speaking to counselor regarding feelings about Parkinson's disease . Patient self care activities - Over the next 90 days, patient will: o Consider accepting referral to speak to someone regarding his feelings about his Parkinson's diagnosis   Medication management . Pharmacist Clinical Goal(s): o Over the next 90 days, patient will work with PharmD and providers to achieve optimal medication adherence . [facility] . Interventions o Comprehensive medication review performed. o Utilize [facility] for medication synchronization, packaging and delivery . Patient self care activities - Over the next 90 days, patient will: o Focus on medication adherence by filling and taking medications  appropriately  o Take medications as prescribed o Report any questions or concerns to PharmD and/or provider(s)  Initial goal documentation       Hypertension   BP goal is:  <130/80  Office blood pressures are  BP Readings from Last 3 Encounters:  04/25/20 116/80  03/31/20 124/80  02/29/20 132/73   Patient checks BP at home 1-2x per week (but does not record readings) Patient home BP readings are ranging: Unable to assess  Patient has failed these meds in the past: None noted  Patient is currently controlled on the following medications:  . HCTZ $Remo'25mg'naRMF$  daily (HS)  Reports he does urinate 2-3 times per night. Recommended patient move HCTZ to the morning.   We discussed BP goal  Plan -Continue current medications  -Move HCTZ to morning time instead of bedtime. Hopefully this will improve overnight urination.    Hyperlipidemia/TIA   LDL goal <70  Last lipids Lab Results  Component Value Date   CHOL 132 04/05/2020   HDL 72  04/05/2020   LDLCALC 45 04/05/2020   TRIG 76 04/05/2020   CHOLHDL 1.8 04/05/2020   Hepatic Function Latest Ref Rng & Units 04/05/2020 11/26/2019 08/23/2019  Total Protein 6.0 - 8.5 g/dL 6.2 6.1 6.0  Albumin 3.7 - 4.7 g/dL 4.1 4.2 3.9  AST 0 - 40 IU/L _0 ALT 0 - 44 IU/L _1 Alk Phosphatase 48 - 121 IU/L 64 67 67  Total Bilirubin 0.0 - 1.2 mg/dL 0.9 0.8 0.5  Bilirubin, Direct 0.00 - 0.40 mg/dL 0.26 0.23 0.19     The ASCVD Risk score (Carlisle., et al., 2013) failed to calculate for the following reasons:   The patient has a prior MI or stroke diagnosis   Patient has failed these meds in past: simvastatin (inefficacy?) Patient is currently controlled on the following medications:  . Rosuvastatin 38m daily (HS) . CoQ10 1028mdaily (BB)  Feels his thighs feel week. Does realize he has Parkinson's that could be a contributor.  Reports the weakness started in 2020, but admits he has taken statins for 20 years.  Reports he was  diagnosed with Parkinson's in 2021. Reports he has lost about 15lbs over the past couple of years and feels it is muscle.   Suspect muscle weakness is due to Parkinson's.  We discussed the differences in stroke prevention with Eliquis vs statin  We discussed:  LDL goal  Plan -Continue current medications  AFIB   Patient is currently rhythm controlled.  Patient has failed these meds in past: None noted  Patient is currently controlled on the following medications:  Amiodarone 20072maily (BB)  Eliquis 5mg20mice daily (BB + HS)  Started the increase dose of amiodarone today. Has not received monitor in the mail yet.  $393/90 DS when in the donut hole $40/90 DS when not in donut hole He is not eligible for patient assistance due to income  We discussed the differences in stroke prevention with Eliquis vs statin  Plan -Continue current medications   Depression   Depression screen PHQ [facility] 08/28/2020 02/29/2020 10/11/2019  Decreased Interest 2 0 0  Down, Depressed, Hopeless _2 PHQ - 2 Score _3 Altered sleeping 0 - -  Tired, decreased energy 1 - -  Change in appetite 0 - -  Feeling bad or failure about yourself  1 - -  Trouble concentrating 1 - -  Moving slowly or fidgety/restless 3 - -  Suicidal thoughts 1 - -  PHQ-9 Score 11 - -  Difficult doing work/chores Somewhat difficult - -    Patient has failed these meds in past: escitalopram (inefficacy?) Patient is currently uncontrolled on the following medications:  . Sertraline 25mg52mly (HS)  Doesn't want to be a burden to family as his Parkinson's increases Discussed option of counseling and he is interested in this.  Not interested in med change at this time. Will consult with PCP to get him referred to psych   Plan -Continue current medications  -Referral to psych  BPH   Followed by urology (Dr. HerriLouis Meckel results Brown for: PSA   Patient has failed these meds in past: None noted  Patient is  currently controlled on the following medications:  . Terazosin 10mg 15my (HS)  Overnight urination: Reports he wakes up 2-3 times per night to urinate. Has trouble falling back to sleep after getting up.  [facility] vs [facility]: Both Bladder Emptying: No  Noted his med list  prior to visit listed 15m, but script it written for 111m  Updated list to reflect 1070mnd recommended patient to confirm with Dr. HerLouis MeckelSuspect correct dose is 5m19mting most recent prescription filled at pharmacy.  Plan -Continue current medications   Parkinson's Disease    Patient has failed these meds in past: None noted Patient is currently controlled on the following medications: . None  Does report some muscle weakness. Wondered if rosuvastatin contributed. Noting duration of rosuvastatin use without side effects, suspect weakness attributed to Parkinson's  Would like to avoid medication for Parkinson's at current time. This diagnosis concerns with regard to loss of function and development of dementia. Most of his positive responses to PHQ 9 can be attributed to Parkinson's. Walks about 2 miles daily with his wife.  Plans to join [employer] patient's with Parkinson's  Plan -Continue current management   Vaccines   Reviewed and discussed patient's vaccination history.   Patient is up to date on all vaccines.   Immunization History  Administered Date(s) Administered  . Influenza Split 07/02/2012  . Influenza Whole 06/07/2013  . Influenza, High Dose Seasonal PF 07/08/2017, 05/19/2019, 06/22/2020  . Influenza,inj,Quad PF,6+ Mos 06/21/2014  . Influenza-Unspecified 06/07/2016, 07/08/2017, 05/26/2018  . PFIZER SARS-COV-2 Vaccination 10/20/2019, 11/10/2019, 06/29/2020  . Pneumococcal Conjugate-13 11/04/2013  . Pneumococcal Polysaccharide-23 10/08/2007, 08/04/2020  . Tdap 11/04/2013  . Zoster 10/07/1998  . Zoster Recombinat (Shingrix) 05/26/2018, 11/05/2018    Medication  Management   [id number] [place] [address] [id number]: [phone] pill box? Yes AM and PM (fills up weekly) Pt reports he very seldom forgets his medications  Miscellaneous Meds AREDS 2 BID Turmeric 1000mg 89my   We discussed: Verbal consent obtained for [facility] enhanced pharmacy services (medication synchronization, adherence packaging, delivery coordination). A medication sync plan was created to allow patient to get all medications delivered once every 30 to 90 days per patient preference. Patient understands they have freedom to choose pharmacy and clinical pharmacist will coordinate care between all prescribers and [facility].   Plan -Utilize [facility] for medication synchronization, packaging and delivery    Follow up:  1 month BP check in  3 month phone visit  KaneshDe BlanchmD Clinical Pharmacist [phone]

## 2020-08-28 NOTE — Telephone Encounter (Signed)
*  STAT* If patient is at the pharmacy, call can be transferred to refill team.   1. Which medications need to be refilled? (please list name of each medication and dose if known) amiodarone (PACERONE) 200 MG tablet ELIQUIS 5 MG TABS tablet rosuvastatin (CRESTOR) 10 MG tablet  2. Which pharmacy/location (including street and city if local pharmacy) is medication to be sent to? Per Karlene Einstein with [facility], patient is switching pharmacies. Rx should go to [place], [place] - [address]  3. Do they need a 30 day or 90 day supply? 90 day supply

## 2020-08-28 NOTE — Telephone Encounter (Signed)
Refill sent in per request.  

## 2020-08-28 NOTE — Progress Notes (Signed)
Patient was recently onboarded with [facility] during his visit with the clinical pharmacist. Made outbound call to Dr. Julien Nordmann office at [phone]. Spoke with Ciara from Dr. Geraldo Pitter and requested the following medications: amiodarone (PACERONE) 200 MG tablet  ELIQUIS 5 MG TABS tablet  rosuvastatin (CRESTOR) 10 MG tablet  She stated on 08-24-2020 the [facility] and Eliquis 5 mg prescriptions on 08-04-2020. She stated she would ask for a transfer for those medications and request Rosuvastatin 10 mg tabs from the provider.  Contacted Cr. Herrick's office at [phone] to requested terazosin (HYTRIN) 10 MG capsule medication be sent to [facility]. Held for a total of 10 minutes before speaking with someone in the office. Spoke with triage nurse, Lovey Newcomer, who stated she would need to confirm the pharmacy change with the patient. Once she confirmed with the patient should be transfer the medication to [facility].  Fanny Skates, [place] Pharmacist Assistant [phone]

## 2020-08-29 ENCOUNTER — Ambulatory Visit: Payer: Medicare HMO

## 2020-08-29 ENCOUNTER — Other Ambulatory Visit: Payer: Self-pay

## 2020-08-29 ENCOUNTER — Ambulatory Visit (INDEPENDENT_AMBULATORY_CARE_PROVIDER_SITE_OTHER): Payer: Medicare HMO

## 2020-08-29 DIAGNOSIS — I480 Paroxysmal atrial fibrillation: Secondary | ICD-10-CM

## 2020-08-29 MED ORDER — SERTRALINE HCL 25 MG PO TABS: 25.0000 mg | ORAL_TABLET | Freq: Every day | ORAL | 1 refills | Status: DC

## 2020-08-29 MED ORDER — HYDROCHLOROTHIAZIDE 25 MG PO TABS: 25.0000 mg | ORAL_TABLET | Freq: Every day | ORAL | 1 refills | Status: DC

## 2020-09-12 ENCOUNTER — Telehealth: Payer: Self-pay | Admitting: Pharmacist

## 2020-09-12 NOTE — Progress Notes (Addendum)
Patient contacted me to inform that he was switching insurance companies to [employer]. He has recently switched to [facility] and wanted to make sure this would not impact his copays for the following year. I informed him that his insurance was accepted with [facility]. He also wanted to ensure he was set up to get a 90 day supply. Per his pharmacy profile, patient is scheduled to get 90 day supply of medication.  Fanny Skates, [place] Pharmacist Assistant [phone]  Reviewed by: De Blanch, PharmD, BCACP Clinical Pharmacist [facility] [phone]

## 2020-09-17 ENCOUNTER — Other Ambulatory Visit: Payer: Self-pay | Admitting: Cardiology

## 2020-09-20 ENCOUNTER — Telehealth: Payer: Self-pay | Admitting: Pharmacist

## 2020-09-20 NOTE — Progress Notes (Addendum)
**Note Walter-Identified via Obfuscation** Chronic Care Management Pharmacy Assistant   Name: Walter Brown  MRN: [id number] DOB: 04-Jul-1941  Reason for Encounter: HTN Disease State  Patient Questions:  1.  Have you seen any other providers since your last visit? No  2.  Any changes in your medicines or health? No    PCP : Walter Lukes, MD  Their chronic conditions include: Hypertension, Hyperlipidemia/TIA, AFib, Depression, BPH, Parkinson's Disease  Allergies:   Allergies  Allergen Reactions   Albuterol Sulfate Palpitations   Azithromycin Other (See Comments)    Hepatotoxicity   Doxycycline Hyclate Other (See Comments)    Taken with Azithromycin and had Heaptotoxicity    Medications: Outpatient Encounter Medications as of 09/20/2020  Medication Sig   amiodarone (PACERONE) 100 MG tablet Take 1 tablet (100 mg total) by mouth daily. NEEDS APPOINTMENT FOR FUTURE REFILL / 1st attempt   apixaban (ELIQUIS) 5 MG TABS tablet Take 1 tablet (5 mg total) by mouth 2 (two) times daily.   hydrochlorothiazide (HYDRODIURIL) 25 MG tablet Take 1 tablet (25 mg total) by mouth daily.   Multiple Vitamins-Minerals (ICAPS AREDS 2 PO) Take by mouth.   rosuvastatin (CRESTOR) 10 MG tablet Take 1 tablet (10 mg total) by mouth daily.   sertraline (ZOLOFT) 25 MG tablet Take 1 tablet (25 mg total) by mouth daily.   terazosin (HYTRIN) 10 MG capsule Take 10 mg by mouth at bedtime.    No facility-administered encounter medications on file as of 09/20/2020.    Current Diagnosis: Patient Active Problem List   Diagnosis Date Noted   Depression, recurrent ([place]) 04/25/2020   Hyperglycemia 04/25/2020   Mild neurocognitive disorder due to Parkinson's disease ([place]) 12/27/2019   Coronary artery calcification 11/23/2019   Parkinson's disease ([place]) 05/19/2019   Obstructive sleep apnea 11/09/2018   PAF (paroxysmal atrial fibrillation) ([place]) 09/09/2018   Palpitations 09/07/2018   ASVD (arteriosclerotic vascular disease) 09/07/2018   Lipoma  08/04/2018   Bradycardia 06/28/2018   Irregular cardiac rhythm 03/10/2018   TIA (transient ischemic attack) 03/10/2018   Grade II hemorrhoids 09/16/2017   Arthritis 04/06/2017   Macular degeneration 03/03/2015   Right shoulder pain 09/26/2014   Back pain with radiation 04/26/2013   Cerumen impaction 08/10/2012   Otitis externa 08/10/2012   Major depressive disorder 08/01/2012   Anemia    Asthma    Hyperlipidemia    Hypertension    BPH (benign prostatic hyperplasia)    Cancer (HCC)    Thrombocytopenia ([place])     Goals Addressed   None    Reviewed chart prior to disease state call. Spoke with patient regarding BP  Recent Office Vitals: BP Readings from Last 3 Encounters:  04/25/20 116/80  03/31/20 124/80  02/29/20 132/73   Pulse Readings from Last 3 Encounters:  04/25/20 62  03/31/20 (!) 52  02/29/20 61    Wt Readings from Last 3 Encounters:  04/25/20 170 lb 12.8 oz (77.5 kg)  03/31/20 168 lb (76.2 kg)  02/29/20 169 lb (76.7 kg)     Kidney Function Lab Results  Component Value Date/Time   CREATININE 1.17 04/05/2020 08:49 AM   CREATININE 1.16 11/26/2019 11:27 AM   CREATININE 1.13 02/22/2014 10:06 AM   CREATININE 1.03 11/04/2013 11:01 AM   GFR 73.06 07/20/2019 02:33 PM   GFRNONAA 59 (L) 04/05/2020 08:49 AM   GFRAA 69 04/05/2020 08:49 AM    BMP Latest Ref Rng & Units 04/05/2020 11/26/2019 08/23/2019  Glucose 65 - 99 mg/dL 95 86 80  BUN  8 - 27 mg/dL 14 12 16   Creatinine 0.76 - 1.27 mg/dL 1.17 1.16 1.01  BUN/Creat Ratio 10 - 24 12 10 16   Sodium 134 - 144 mmol/L 142 141 141  Potassium 3.5 - 5.2 mmol/L 4.2 4.5 4.0  Chloride 96 - 106 mmol/L 104 104 104  CO2 20 - 29 mmol/L 26 24 24   Calcium 8.6 - 10.2 mg/dL 9.2 9.3 8.9    Current antihypertensive regimen:  HCTZ 25 mg daily AM now vs PM. Patient reports this has assisted some what with night time urination.   How often are you checking your Blood Pressure? 1-2x per week Patient was asked to check BP 1-2 times  per week. Is compliant  Current home BP readings: 11-22: 140/72, 12-3: 144/77, 12-4: 129/63, 12-6: 140/85, 12-9: 138/81.  What recent interventions/DTPs have been made by any provider to improve Blood Pressure control since last CPP Visit: none  Any recent hospitalizations or ED visits since last visit with CPP? No   What diet changes have been made to improve Blood Pressure Control?  Not many changes. He usually has cereal or oatmeal for breakfast. Lunch he has a sandwich and fruit. For dinner he has home cooked meals that include a meat and a vegetable.  What exercise is being done to improve your Blood Pressure Control?  Patient states he walks a couple of miles three to four to times day.   [name] inquired if there was a possibility to decrease or remove his current statin regimen if his cholesterol level was controlled. Informed him I would forward his inquiry to the clinical pharmacist for further evaluation. He has a lab appointment scheduled for Monday January 17th at 9:00 am.  Adherence Review: Is the patient currently on ACE/ARB medication? No Does the patient have >5 day gap between last estimated fill dates? No    Follow-Up:  Pharmacist Review   Walter Brown, [place] Pharmacist Assistant [phone]  Noted patient's question to reduce or remove statin therapy. Noting his history of ASCVD his LDL goal is less than 70 with trend toward lower LDL being better, so would not recommend reducing or D/C medication regimen if pt is tolerating regimen.   6 minutes spent in review, coordination, and documentation.   Reviewed by: Walter Brown, PharmD, BCACP Clinical Pharmacist [place] [facility] [phone]

## 2020-09-27 ENCOUNTER — Other Ambulatory Visit: Payer: Self-pay

## 2020-09-27 ENCOUNTER — Ambulatory Visit: Payer: Medicare HMO | Admitting: Physician Assistant

## 2020-09-27 ENCOUNTER — Encounter: Payer: Self-pay | Admitting: Physician Assistant

## 2020-09-27 VITALS — BP 110/70 | HR 65 | Ht 65.0 in | Wt 174.8 lb

## 2020-09-27 DIAGNOSIS — I480 Paroxysmal atrial fibrillation: Secondary | ICD-10-CM

## 2020-09-27 DIAGNOSIS — E785 Hyperlipidemia, unspecified: Secondary | ICD-10-CM | POA: Diagnosis not present

## 2020-09-27 DIAGNOSIS — I10 Essential (primary) hypertension: Secondary | ICD-10-CM

## 2020-09-27 NOTE — Progress Notes (Signed)
Cardiology Office Note:    Date:  09/29/2020   ID:  DAMONE Brown, DOB June 29, 1941, MRN [id number]  PCP:  [name], MD  [facility] Cardiologist:  Previously Dr. Tomie China --> switching to Dr. Rebecca [facility] Electrophysiologist:  None   Referring MD: [name], MD   Chief Complaint  Patient presents with  . Follow-up    Seen for Dr. Allyson Sabal    History of Present Illness:    Walter Brown is a 79 y.o. male with a hx of hypertension, hyperlipidemia and history of PAF.  Atrial fibrillation is controlled with amiodarone and Eliquis.  Based on recent telephone note, patient asked to speak to switch to Dr. Allyson Sabal for cardiac care.  Due to concern of increased atrial fibrillation, I increased his amiodarone to 200 mg daily and recommended a 2-week heart monitor.  Patient presents today for cardiology visit.  Unfortunately the heart monitor report has not come back yet.  It is still en route to the manufacturer.  He does not have any recent chest pain or shortness of breath.  Heart rate is quite regular on physical exam.  We discussed the path of physiology behind atrial fibrillation and its management.  Once I receive the heart monitor report, I will give the patient on call and explained the detail of her monitor report.  He still noticed his Apple Watch occasionally record a very high heart rate, however I am not sure this is accurate.  Patient has some degree of Parkinson's and his shaking may have falsely triggered some elevated heart rate.  He also noticed significant increase in heart rate with activity, he has a distant smoking history who quit 40 years ago, some of this may increase your heart rate with activity may be more related to pulmonary issue rather than cardiac issue.  Once I receive the heart monitor report, I can explain further detail with the patient.  Otherwise he can follow-up with Dr. Gery Pray in 3 months.   Past Medical History:  Diagnosis Date  . Anemia     mild  . Arthritis 04/06/2017  . Asthma    childhood  . ASVD (arteriosclerotic vascular disease) 09/07/2018  . Basal cell carcinoma    skin- on nose- basal cell (20 yrs ago) forehead 1 year ago  . BPH (benign prostatic hyperplasia)   . Bradycardia 06/28/2018  . Cerumen impaction 08/10/2012  . Chicken pox as child  . Coronary artery calcification 11/23/2019  . [place] measles as a child  . Hyperlipidemia   . Hypertension   . Irregular cardiac rhythm 03/10/2018  . Macular degeneration   . Major depressive disorder 08/01/2012  . 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  . Obstructive sleep apnea 11/09/2018   Patient reports mild symptoms; was not prescribed a CPAP machine  . Otitis externa 08/10/2012  . PAF (paroxysmal atrial fibrillation)   . Parkinson's disease 05/19/2019  . Thrombocytopenia   . TIA (transient ischemic attack) 03/10/2018    Past Surgical History:  Procedure Laterality Date  . BELPHAROPTOSIS REPAIR     very young, b/l  . EXCISIONAL HEMORRHOIDECTOMY    . HYDROCELE EXCISION / REPAIR  2012   b/l  . SKIN CANCER EXCISION     nose and forehead, basal cell CA    Current Medications: Current Meds  Medication Sig  . amiodarone (PACERONE) 100 MG tablet Take 1 tablet (100 mg total) by mouth daily. NEEDS APPOINTMENT FOR FUTURE REFILL / 1st  attempt (Patient taking differently: Take 100 mg by mouth daily. Pt takes 200 mg daily)  . apixaban (ELIQUIS) 5 MG TABS tablet Take 1 tablet (5 mg total) by mouth 2 (two) times daily.  . hydrochlorothiazide (HYDRODIURIL) 25 MG tablet Take 1 tablet (25 mg total) by mouth daily.  . Multiple Vitamins-Minerals (ICAPS AREDS 2 PO) Take by mouth.  . rosuvastatin (CRESTOR) 10 MG tablet Take 1 tablet (10 mg total) by mouth daily.  . sertraline (ZOLOFT) 25 MG tablet Take 1 tablet (25 mg total) by mouth daily.  Marland Kitchen terazosin (HYTRIN) 10 MG capsule Take 10 mg by mouth at bedtime.   . TURMERIC PO Take by mouth daily.      Allergies:   Albuterol sulfate, Azithromycin, and Doxycycline hyclate   Social History   Socioeconomic History  . Marital status: Married    Spouse name: Not on file  . Number of children: 2  . Years of education: 16  . Highest education level: Bachelor's degree (e.g., BA, AB, BS)  Occupational History  . Occupation: retired  Tobacco Use  . Smoking status: Former Smoker    Packs/day: 1.50    Years: 15.00    Pack years: 22.50    Start date: 10/07/1978  . Smokeless tobacco: Never Used  Vaping Use  . Vaping Use: Never used  Substance and Sexual Activity  . Alcohol use: Yes    Alcohol/week: 7.0 - 14.0 standard drinks    Types: 7 - 14 Glasses of wine per week    Comment: 1-2 glasses of wine with dinner  . Drug use: No  . Sexual activity: Yes    Comment: lives with wife, 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  Other Topics Concern  . Not on file  Social History Narrative  . Not on file   Social Determinants of Health   Financial Resource Strain: Not on file  Food Insecurity: Not on file  Transportation Needs: Not on file  Physical Activity: Not on file  Stress: Not on file  Social Connections: Not on file     Family History: The patient's family history includes ADD / ADHD in his son; Cancer in his paternal grandfather; Heart disease in his father and mother; Hypertension in his father and mother; Other in his mother; Other (age of onset: 27) in his son; Parkinson's disease in his father; Prostate cancer in his maternal grandfather; Prostate cancer (age of onset: 21) in his father; Stroke in his maternal grandmother.  ROS:   Please see the history of present illness.     All other systems reviewed and are negative.  EKGs/Labs/Other Studies Reviewed:    The following studies were reviewed today:  Myoview 04/23/2018   Nuclear stress EF: 55%.  Blood pressure demonstrated a normal response to exercise.  There was no  ST segment deviation noted during stress.  The study is normal.  This is a low risk study.  The left ventricular ejection fraction is normal (55-65%).   Normal exercise nuclear stress test with no evidence for prior infarct or ischemia.    EKG:  EKG is not ordered today.    Recent Labs: 04/05/2020: ALT 13; BUN 14; Creatinine, Ser 1.17; Hemoglobin 14.1; Platelets 118; Potassium 4.2; Sodium 142; TSH 1.880  Recent Lipid Panel    Component Value Date/Time   CHOL 132 04/05/2020 0849   TRIG 76 04/05/2020 0849   HDL 72 04/05/2020 0849   CHOLHDL 1.8 04/05/2020 0849  CHOLHDL 2 07/20/2019 1433   VLDL 15.8 07/20/2019 1433   LDLCALC 45 04/05/2020 0849     Risk Assessment/Calculations:     CHA2DS2-VASc Score = 3  This indicates a 3.2% annual risk of stroke. The patient's score is based upon: CHF History: No HTN History: Yes Diabetes History: No Stroke History: No Vascular Disease History: No Age Score: 2 Gender Score: 0      Physical Exam:    VS:  BP 110/70 (BP Location: Right Arm, Patient Position: Sitting)   Pulse 65   Ht 5\' 5"  (1.651 m)   Wt 174 lb 12.8 oz (79.3 kg)   SpO2 97%   BMI 29.09 kg/m     Wt Readings from Last 3 Encounters:  09/27/20 174 lb 12.8 oz (79.3 kg)  04/25/20 170 lb 12.8 oz (77.5 kg)  03/31/20 168 lb (76.2 kg)     GEN:  Well nourished, well developed in no acute distress HEENT: Normal NECK: No JVD; No carotid bruits LYMPHATICS: No lymphadenopathy CARDIAC: RRR, no murmurs, rubs, gallops RESPIRATORY:  Clear to auscultation without rales, wheezing or rhonchi  ABDOMEN: Soft, non-tender, non-distended MUSCULOSKELETAL:  No edema; No deformity  SKIN: Warm and dry NEUROLOGIC:  Alert and oriented x 3 PSYCHIATRIC:  Normal affect   ASSESSMENT:    1. PAF (paroxysmal atrial fibrillation) ([place])   2. Essential hypertension   3. Hyperlipidemia LDL goal <100    PLAN:    In order of problems listed above:  1. PAF: Continue Eliquis and  amiodarone.  I suspect he is not on any rate control medication due to baseline bradycardia.  Recent heart monitor is pending to see the A. fib burden.  He does have an apple watch however does not know how to obtain a EKG strip from it.  2. Hypertension: Blood pressure stable  3. Hyperlipidemia: On Crestor.        Medication Adjustments/Labs and Tests Ordered: Current medicines are reviewed at length with the patient today.  Concerns regarding medicines are outlined above.  No orders of the defined types were placed in this encounter.  No orders of the defined types were placed in this encounter.   Patient Instructions  Medication Instructions:  Your physician recommends that you continue on your current medications as directed. Please refer to the Current Medication list given to you today.  *If you need a refill on your cardiac medications before your next appointment, please call your pharmacy*  Lab Work: NONE ordered at this time of appointment   If you have labs (blood work) drawn today and your tests are completely normal, you will receive your results only by: Marland Kitchen MyChart Message (if you have MyChart) OR . A paper copy in the mail If you have any lab test that is abnormal or we need to change your treatment, we will call you to review the results.  Testing/Procedures: NONE ordered at this time of appointment   Follow-Up: At [facility], you and your health needs are our priority.  As part of our continuing mission to provide you with exceptional heart care, we have created designated Provider Care Teams.  These Care Teams include your primary Cardiologist (physician) and Advanced Practice Providers (APPs -  Physician Assistants and Nurse Practitioners) who all work together to provide you with the care you need, when you need it.  Your next appointment:   3 month(s)  The format for your next appointment:   In Person  Provider:   Quay Burow, MD  Other  Instructions      Signed, Almyra Deforest, [place]  09/29/2020 11:26 PM    [facility]

## 2020-09-27 NOTE — Patient Instructions (Addendum)
Medication Instructions:  Your physician recommends that you continue on your current medications as directed. Please refer to the Current Medication list given to you today.  *If you need a refill on your cardiac medications before your next appointment, please call your pharmacy*  Lab Work: NONE ordered at this time of appointment   If you have labs (blood work) drawn today and your tests are completely normal, you will receive your results only by: . MyChart Message (if you have MyChart) OR . A paper copy in the mail If you have any lab test that is abnormal or we need to change your treatment, we will call you to review the results.  Testing/Procedures: NONE ordered at this time of appointment   Follow-Up: At [facility], you and your health needs are our priority.  As part of our continuing mission to provide you with exceptional heart care, we have created designated Provider Care Teams.  These Care Teams include your primary Cardiologist (physician) and Advanced Practice Providers (APPs -  Physician Assistants and Nurse Practitioners) who all work together to provide you with the care you need, when you need it.  Your next appointment:   3 month(s)  The format for your next appointment:   In Person  Provider:   Jonathan Berry, MD  Other Instructions   

## 2020-09-29 ENCOUNTER — Encounter: Payer: Self-pay | Admitting: Physician Assistant

## 2020-10-02 DIAGNOSIS — I480 Paroxysmal atrial fibrillation: Secondary | ICD-10-CM | POA: Diagnosis not present

## 2020-10-03 ENCOUNTER — Other Ambulatory Visit: Payer: Self-pay | Admitting: *Deleted

## 2020-10-03 ENCOUNTER — Other Ambulatory Visit: Payer: Self-pay | Admitting: Physician Assistant

## 2020-10-03 DIAGNOSIS — I480 Paroxysmal atrial fibrillation: Secondary | ICD-10-CM

## 2020-10-05 ENCOUNTER — Telehealth: Payer: Self-pay | Admitting: Physician Assistant

## 2020-10-05 NOTE — Telephone Encounter (Signed)
I spoke with Mr. Heart regarding his recent heart monitor report.  Final result of the report has not came back yet, however I was able to take a look at the raw strip.  He has a very low atrial flutter atrial fibrillation burden.  PVC burden is also very low as well.  I recommended continue on the current dose of amiodarone.  He has follow-up with Dr. Allyson Sabal in March.

## 2020-10-20 ENCOUNTER — Other Ambulatory Visit (INDEPENDENT_AMBULATORY_CARE_PROVIDER_SITE_OTHER): Payer: PPO

## 2020-10-20 ENCOUNTER — Other Ambulatory Visit: Payer: Self-pay

## 2020-10-20 DIAGNOSIS — R739 Hyperglycemia, unspecified: Secondary | ICD-10-CM | POA: Diagnosis not present

## 2020-10-20 DIAGNOSIS — E782 Mixed hyperlipidemia: Secondary | ICD-10-CM

## 2020-10-20 DIAGNOSIS — I10 Essential (primary) hypertension: Secondary | ICD-10-CM

## 2020-10-20 NOTE — Addendum Note (Signed)
Addended by: Kelle Darting A on: 10/20/2020 02:26 PM   Modules accepted: Orders

## 2020-10-21 LAB — TSH: TSH: 1.45 mIU/L (ref 0.40–4.50)

## 2020-10-21 LAB — HEMOGLOBIN A1C
Hgb A1c MFr Bld: 5 % of total Hgb (ref <5.7)
Mean Plasma Glucose: 97 mg/dL
eAG (mmol/L): 5.4 mmol/L

## 2020-10-21 LAB — COMPREHENSIVE METABOLIC PANEL
AG Ratio: 2 (calc) (ref 1.0–2.5)
ALT: 18 U/L (ref 9–46)
AST: 21 U/L (ref 10–35)
Albumin: 4 g/dL (ref 3.6–5.1)
Alkaline phosphatase (APISO): 60 U/L (ref 35–144)
BUN: 17 mg/dL (ref 7–25)
CO2: 30 mmol/L (ref 20–32)
Calcium: 9 mg/dL (ref 8.6–10.3)
Chloride: 105 mmol/L (ref 98–110)
Creat: 1.16 mg/dL (ref 0.70–1.18)
Globulin: 2 g/dL (calc) (ref 1.9–3.7)
Glucose, Bld: 107 mg/dL — ABNORMAL HIGH (ref 65–99)
Potassium: 4.1 mmol/L (ref 3.5–5.3)
Sodium: 141 mmol/L (ref 135–146)
Total Bilirubin: 0.6 mg/dL (ref 0.2–1.2)
Total Protein: 6 g/dL — ABNORMAL LOW (ref 6.1–8.1)

## 2020-10-21 LAB — LIPID PANEL
Cholesterol: 121 mg/dL (ref <200)
HDL: 67 mg/dL (ref >=40)
LDL Cholesterol (Calc): 36 mg/dL (calc)
Non-HDL Cholesterol (Calc): 54 mg/dL (calc) (ref <130)
Total CHOL/HDL Ratio: 1.8 (calc) (ref <5.0)
Triglycerides: 95 mg/dL (ref <150)

## 2020-10-21 LAB — CBC
HCT: 40.6 % (ref 38.5–50.0)
Hemoglobin: 14 g/dL (ref 13.2–17.1)
MCH: 32.2 pg (ref 27.0–33.0)
MCHC: 34.5 g/dL (ref 32.0–36.0)
MCV: 93.3 fL (ref 80.0–100.0)
MPV: 11.7 fL (ref 7.5–12.5)
Platelets: 128 10*3/uL — ABNORMAL LOW (ref 140–400)
RBC: 4.35 10*6/uL (ref 4.20–5.80)
RDW: 12.6 % (ref 11.0–15.0)
WBC: 6.8 10*3/uL (ref 3.8–10.8)

## 2020-10-23 ENCOUNTER — Other Ambulatory Visit: Payer: PPO

## 2020-10-26 ENCOUNTER — Ambulatory Visit (INDEPENDENT_AMBULATORY_CARE_PROVIDER_SITE_OTHER): Payer: PPO | Admitting: Family Medicine

## 2020-10-26 ENCOUNTER — Encounter: Payer: Self-pay | Admitting: Family Medicine

## 2020-10-26 ENCOUNTER — Other Ambulatory Visit: Payer: Self-pay

## 2020-10-26 VITALS — BP 112/66 | HR 63 | Temp 98.1°F | Resp 12 | Ht 64.4 in | Wt 173.0 lb

## 2020-10-26 DIAGNOSIS — R739 Hyperglycemia, unspecified: Secondary | ICD-10-CM

## 2020-10-26 DIAGNOSIS — I10 Essential (primary) hypertension: Secondary | ICD-10-CM | POA: Diagnosis not present

## 2020-10-26 DIAGNOSIS — G20 Parkinson's disease: Secondary | ICD-10-CM

## 2020-10-26 DIAGNOSIS — Z0000 Encounter for general adult medical examination without abnormal findings: Secondary | ICD-10-CM

## 2020-10-26 DIAGNOSIS — E782 Mixed hyperlipidemia: Secondary | ICD-10-CM | POA: Diagnosis not present

## 2020-10-26 DIAGNOSIS — F339 Major depressive disorder, recurrent, unspecified: Secondary | ICD-10-CM

## 2020-10-26 MED ORDER — SERTRALINE HCL 50 MG PO TABS: 50.0000 mg | ORAL_TABLET | Freq: Every day | ORAL | 1 refills | Status: DC

## 2020-10-26 NOTE — Patient Instructions (Signed)
Parkinson's Disease Parkinson's disease is a type of movement disorder. It is a long-term condition that gets worse over time (is progressive). Each person with Parkinson's disease is affected differently. This condition limits your ability to control movements and move your body normally. The condition can range from mild to severe. Parkinson's disease tends to get worse slowly over several years. What are the causes? Parkinson's disease results from a loss of brain cells (neurons) that make a brain chemical called dopamine. Dopamine is needed to control movement. As the condition gets worse, neurons make less dopamine. This makes it hard to move or control your movements. The exact cause of the loss of neurons and why they make less dopamine is not known. Factors related to genes and the environment may contribute to the cause of Parkinson's disease. What increases the risk? The following factors may make you more likely to develop this condition:  Being male.  Being age 60 or older.  Having a family history of Parkinson's disease.  Having had a traumatic brain injury.  Having experienced depression.  Having been exposed to toxins, such as pesticides. What are the signs or symptoms? Symptoms of this condition can vary. The main symptoms are related to movement. These include:  A tremor or shaking while you are resting that you cannot control.  Stiffness in your neck, arms, and legs (rigidity).  Slowing of movement. You may lose facial expressions and have trouble making small movements that are needed to button clothing or brush your teeth.  An abnormal walk. You may walk with short, shuffling steps.  Loss of balance and stability when standing. You may sway, fall backward, and have trouble making turns. Other symptoms include:  Mental or cognitive changes including depression, anxiety, having false beliefs (delusions), or seeing, hearing, or feeling things that do not exist  (hallucinations).  Trouble speaking or swallowing.  Changes in bowel or bladder functions including constipation, having to go urgently or frequently, or not being able to control your bowel or bladder.  Changes in sleep habits or trouble sleeping. Parkinson's disease may be graded by severity of your condition as mild, moderate, or advanced. Parkinson's disease progression is different for everyone. You may not progress to the advanced stage.  Mild Parkinson's disease involves: ? Movement problems that do not affect daily activities. ? Movement problems on one side of the body.  Moderate Parkinson's disease involves: ? Movement problems on both sides of the body. ? Slowing of movement. ? Coordination and balance problems.  Advanced Parkinson's disease involves: ? Extreme difficulty walking. ? Inability to live alone safely. ? Signs of dementia, such as having trouble remembering things, doing daily tasks such as getting dressed, and problem solving.   How is this diagnosed? This condition is diagnosed by a specialist. A diagnosis may be made based on symptoms, your medical history, and a physical exam. You may also have brain imaging tests to check for a loss of dopamine-producing areas of the brain. How is this treated? There is no cure for Parkinson's disease. Treatment focuses on managing your symptoms. Treatment may include:  Medicines. Everyone responds to medicines differently. Your response may change over time. Work with your health care provider to find the best medicines for you.  Speech, occupational, and physical therapy.  Deep brain stimulation surgery to reduce tremors and other involuntary movements. Follow these instructions at home: Medicines  Take over-the-counter and prescription medicines only as told by your health care provider.  Avoid taking medicines that   can affect thinking, such as pain or sleeping medicines. Eating and drinking  Follow instructions  from your health care provider about eating or drinking restrictions.  Do not drink alcohol. Activity  Talk with your health care provider about if it is safe for you to drive.  Do exercises as told by your health care provider or physical therapist. Lifestyle  Install grab bars and railings in your home to prevent falls.  Do not use any products that contain nicotine or tobacco, such as cigarettes, e-cigarettes, and chewing tobacco. If you need help quitting, ask your health care provider.  Consider joining a support group for people with Parkinson's disease.      General instructions  Work with your health care provider to determine what you need help with and what your safety needs are.  Keep all follow-up visits as told by your health care provider, including any visits with a physical therapist, speech therapist, or occupational therapist. This is important. Contact a health care provider if:  Medicines do not help your symptoms.  You are unsteady or have fallen at home.  You need more support to function well at home.  You have trouble swallowing.  You have severe constipation.  You are having problems with side effects from your medicines.  You feel confused, anxious, or depressed. Get help right away if you:  Are injured after a fall.  See or hear things that are not real.  Cannot swallow without choking.  Have chest pain or trouble breathing.  Do not feel safe at home.  Have thoughts about hurting yourself or others. If you ever feel like you may hurt yourself or others, or have thoughts about taking your own life, get help right away. You can go to your nearest emergency department or call:  Your local emergency services (911 in the [place]  A suicide crisis helpline, such as the [place] at 951-278-0785. This is open 24 hours a day. Summary  Parkinson's disease is a long-term condition that gets worse over time. This  condition limits your ability to control your movements and move your body normally.  There is no cure for Parkinson's disease. Treatment focuses on managing your symptoms.  Work with your health care provider to determine what you need help with and what your safety needs are.  Keep all follow-up visits as told by your health care provider, including any visits with a physical therapist, speech therapist, or occupational therapist. This is important. This information is not intended to replace advice given to you by your health care provider. Make sure you discuss any questions you have with your health care provider. Document Revised: 12/10/2018 Document Reviewed: 12/10/2018 Elsevier Patient Education  [place].

## 2020-10-27 NOTE — Progress Notes (Signed)
Assessment/Plan:   1.  Parkinsons Disease  -pt declines med.  We discussed data recommending early levodopa but he isn't ready for it now  -DaT scan abnormal in the past  -discussed increasing exercising  -We discussed that it used to be thought that levodopa would increase risk of melanoma but now it is believed that Parkinsons itself likely increases risk of melanoma. he is to get regular skin checks.  He does see dermatology 2.  MCI  -Neurocognitive testing with Dr. Melvyn Novas in March, 2021 with evidence of [facility]CI only.  3.  Depression  -states that PCP just increased zoloft to 50 mg daily this week.  Keep an eye on it as this is low dose.  Subjective:   Walter Brown was seen today in follow up for Parkinsons disease.  My previous records were reviewed prior to todays visit as well as outside records available to me. Pt denies falls.  States that he feels like his physical sx's are stable.  Little slow getting OOC.  Pt denies lightheadedness, near syncope.  No hallucinations.  Mood has been depressed.  He is walking for exercising but otherwise states that I am not exercising "like I should."    Current prescribed movement disorder medications: None   ALLERGIES:   Allergies  Allergen Reactions  . Albuterol Sulfate Palpitations  . Azithromycin Other (See Comments)    Hepatotoxicity  . Doxycycline Hyclate Other (See Comments)    Taken with Azithromycin and had Heaptotoxicity    CURRENT MEDICATIONS:  Outpatient Encounter Medications as of 10/31/2020  Medication Sig  . amiodarone (PACERONE) 200 MG tablet Take 200 mg by mouth daily.  Marland Kitchen apixaban (ELIQUIS) 5 MG TABS tablet Take 1 tablet (5 mg total) by mouth 2 (two) times daily.  . hydrochlorothiazide (HYDRODIURIL) 25 MG tablet Take 1 tablet (25 mg total) by mouth daily.  . Multiple Vitamins-Minerals (ICAPS AREDS 2 PO) Take 1 capsule by mouth in the morning and at bedtime.  . rosuvastatin (CRESTOR) 10 MG tablet Take 1 tablet (10 mg  total) by mouth daily.  . sertraline (ZOLOFT) 50 MG tablet Take 1 tablet (50 mg total) by mouth daily.  Marland Kitchen terazosin (HYTRIN) 10 MG capsule Take 10 mg by mouth at bedtime.   . TURMERIC PO Take by mouth daily. (Patient not taking: Reported on 10/31/2020)   No facility-administered encounter medications on file as of 10/31/2020.    Objective:   PHYSICAL EXAMINATION:    VITALS:   Vitals:   10/31/20 1305  BP: 118/68  Pulse: 80  SpO2: 98%  Weight: 173 lb (78.5 kg)  Height: 5\' 5"  (1.651 m)    GEN:  The patient appears stated age and is in NAD. HEENT:  Normocephalic, atraumatic.  The mucous membranes are moist. The superficial temporal arteries are without ropiness or tenderness. CV:  RRR Lungs:  CTAB Neck/HEME:  There are no carotid bruits bilaterally.  Neurological examination:  Orientation: The patient is alert and oriented x3. Cranial nerves: There is good facial symmetry with facial hypomimia. The speech is fluent and clear. Soft palate rises symmetrically and there is no tongue deviation. Hearing is intact to conversational tone. Sensation: Sensation is intact to light touch throughout Motor: Strength is at least antigravity x4.  Movement examination: Tone: There is mild increased tone in the rue Abnormal movements: none Coordination:  There is mild decremation with RAM's, with any form of RAMS, including alternating supination and pronation of the forearm, hand opening and closing, finger  taps, heel taps and toe taps on the right Gait and Station: The patient has no difficulty arising out of a deep-seated chair without the use of the hands. The patient's stride length is good but he slightly drags the R leg and decreased arm swing on the R.    I have reviewed and interpreted the following labs independently    Chemistry      Component Value Date/Time   NA 141 10/20/2020 1426   NA 142 04/05/2020 0849   K 4.1 10/20/2020 1426   CL 105 10/20/2020 1426   CO2 30 10/20/2020  1426   BUN 17 10/20/2020 1426   BUN 14 04/05/2020 0849   CREATININE 1.16 10/20/2020 1426      Component Value Date/Time   CALCIUM 9.0 10/20/2020 1426   ALKPHOS 64 04/05/2020 0849   AST 21 10/20/2020 1426   ALT 18 10/20/2020 1426   BILITOT 0.6 10/20/2020 1426   BILITOT 0.9 04/05/2020 0849       Lab Results  Component Value Date   WBC 6.8 10/20/2020   HGB 14.0 10/20/2020   HCT 40.6 10/20/2020   MCV 93.3 10/20/2020   PLT 128 (L) 10/20/2020    Lab Results  Component Value Date   TSH 1.45 10/20/2020     Total time spent on today's visit was 20 minutes, including both face-to-face time and nonface-to-face time.  Time included that spent on review of records (prior notes available to me/labs/imaging if pertinent), discussing treatment and goals, answering patient's questions and coordinating care.  Cc:  [name], MD

## 2020-10-29 NOTE — Assessment & Plan Note (Signed)
Encouraged heart healthy diet, increase exercise, avoid trans fats, consider a krill oil cap daily. Tolerating Rosuvastatin 

## 2020-10-29 NOTE — Assessment & Plan Note (Signed)
Following with neurology and staying very active including riding his stationary bike and attending [employer].

## 2020-10-29 NOTE — Assessment & Plan Note (Addendum)
Patient encouraged to maintain heart healthy diet, regular exercise, adequate sleep. Consider daily probiotics. Take medications as prescribed. Labs reviewed. Has aged out of screening colonoscopy

## 2020-10-29 NOTE — Progress Notes (Signed)
Subjective:    Patient ID: Walter Brown, male    DOB: Mar 01, 1941, 80 y.o.   MRN: [id number]  Chief Complaint  Patient presents with  . Annual Exam         HPI Patient is in today for annual preventative exam and follow up on chronic medical concerns. No recent febrile illness or hospitalizations. He is doing well. Tries to maintain a heart healthy diet and he exercises regularly. He boxes, walks and rides his stationary bike. His Parkinsons has been stable. He tolerated his COVID shots. No polyuria or polydispia. Denies CP/palp/SOB/HA/congestion/fevers/GI or GU c/o. Taking meds as prescribed. He notes anhedonia and fatigue and feels his depression is getting to him. No c/o suicidal ideation.  Past Medical History:  Diagnosis Date  . Anemia    mild  . Arthritis 04/06/2017  . Asthma    childhood  . ASVD (arteriosclerotic vascular disease) 09/07/2018  . Basal cell carcinoma    skin- on nose- basal cell (20 yrs ago) forehead 1 year ago  . BPH (benign prostatic hyperplasia)   . Bradycardia 06/28/2018  . Cerumen impaction 08/10/2012  . Chicken pox as child  . Coronary artery calcification 11/23/2019  . [place] measles as a child  . Hyperlipidemia   . Hypertension   . Irregular cardiac rhythm 03/10/2018  . Macular degeneration   . Major depressive disorder 08/01/2012  . Mild neurocognitive disorder due to Parkinson's disease ([place]) 12/27/2019  . Obstructive sleep apnea 11/09/2018   Patient reports mild symptoms; was not prescribed a CPAP machine  . Otitis externa 08/10/2012  . PAF (paroxysmal atrial fibrillation)   . Parkinson's disease 05/19/2019  . Thrombocytopenia   . TIA (transient ischemic attack) 03/10/2018    Past Surgical History:  Procedure Laterality Date  . [place]     very young, b/l  . EXCISIONAL HEMORRHOIDECTOMY    . HYDROCELE EXCISION / REPAIR  2012   b/l  . SKIN CANCER EXCISION     nose and forehead, basal cell CA    Family History  Problem Relation  Age of Onset  . Hypertension Mother   . Other Mother        MRSA  . Heart disease Mother        stents  . Heart disease Father   . Hypertension Father   . Prostate cancer Father 22  . Parkinson's disease Father   . Prostate cancer Maternal Grandfather   . ADD / ADHD Son        ADHD  . Other Son 24       part of 1 lung removed- due to infection  . Stroke Maternal Grandmother   . Cancer Paternal Grandfather        EYE    Social History   Socioeconomic History  . Marital status: Married    Spouse name: Not on file  . Number of children: 2  . Years of education: 16  . Highest education level: Bachelor's degree (e.g., BA, AB, BS)  Occupational History  . Occupation: retired  Tobacco Use  . Smoking status: Former Smoker    Packs/day: 1.50    Years: 15.00    Pack years: 22.50    Start date: 10/07/1978  . Smokeless tobacco: Never Used  Vaping Use  . Vaping Use: Never used  Substance and Sexual Activity  . Alcohol use: Yes    Alcohol/week: 7.0 - 14.0 standard drinks    Types: 7 - 14 Glasses of wine per  week    Comment: 1-2 glasses of wine with dinner  . Drug use: No  . 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  Other Topics Concern  . Not on file  Social History Narrative  . Not on file   Social Determinants of Health   Financial Resource Strain: Not on file  Food Insecurity: Not on file  Transportation Needs: Not on file  Physical Activity: Not on file  Stress: Not on file  Social Connections: Not on file  Intimate Partner Violence: Not on file    Outpatient Medications Prior to Visit  Medication Sig Dispense Refill  . amiodarone (PACERONE) 200 MG tablet Take 200 mg by mouth daily.    Marland Kitchen apixaban (ELIQUIS) 5 MG TABS tablet Take 1 tablet (5 mg total) by mouth 2 (two) times daily. 180 tablet 3  . hydrochlorothiazide (HYDRODIURIL) 25 MG tablet Take 1 tablet (25 mg  total) by mouth daily. 90 tablet 1  . Multiple Vitamins-Minerals (ICAPS AREDS 2 PO) Take by mouth.    . rosuvastatin (CRESTOR) 10 MG tablet Take 1 tablet (10 mg total) by mouth daily. 90 tablet 3  . terazosin (HYTRIN) 10 MG capsule Take 10 mg by mouth at bedtime.     . TURMERIC PO Take by mouth daily.    . sertraline (ZOLOFT) 25 MG tablet Take 1 tablet (25 mg total) by mouth daily. 90 tablet 1  . amiodarone (PACERONE) 100 MG tablet Take 1 tablet (100 mg total) by mouth daily. NEEDS APPOINTMENT FOR FUTURE REFILL / 1st attempt (Patient taking differently: Take 100 mg by mouth daily. Pt takes 200 mg daily) 90 tablet 0   No facility-administered medications prior to visit.    Allergies  Allergen Reactions  . Albuterol Sulfate Palpitations  . Azithromycin Other (See Comments)    Hepatotoxicity  . Doxycycline Hyclate Other (See Comments)    Taken with Azithromycin and had Heaptotoxicity    Review of Systems  Constitutional: Positive for malaise/fatigue. Negative for chills and fever.  HENT: Negative for congestion and hearing loss.   Eyes: Negative for discharge.  Respiratory: Negative for cough, sputum production and shortness of breath.   Cardiovascular: Negative for chest pain, palpitations and leg swelling.  Gastrointestinal: Negative for abdominal pain, blood in stool, constipation, diarrhea, heartburn, nausea and vomiting.  Genitourinary: Negative for dysuria, frequency, hematuria and urgency.  Musculoskeletal: Negative for back pain, falls and myalgias.  Skin: Negative for rash.  Neurological: Negative for dizziness, sensory change, loss of consciousness, weakness and headaches.  Endo/Heme/Allergies: Negative for environmental allergies. Does not bruise/bleed easily.  Psychiatric/Behavioral: Positive for depression. Negative for suicidal ideas. The patient is not nervous/anxious and does not have insomnia.        Objective:    Physical Exam Vitals and nursing note reviewed.   Constitutional:      General: He is not in acute distress.    Appearance: Normal appearance. He is well-developed and well-nourished. He is not ill-appearing.  HENT:     Head: Normocephalic and atraumatic.     Right Ear: Tympanic membrane, ear canal and external ear normal. There is no impacted cerumen.     Left Ear: Tympanic membrane, ear canal and external ear normal.     Nose: Nose normal.  Eyes:     General:        Right eye: No discharge.        Left  eye: No discharge.  Cardiovascular:     Rate and Rhythm: Normal rate and regular rhythm.     Heart sounds: No murmur heard.   Pulmonary:     Effort: Pulmonary effort is normal.     Breath sounds: Normal breath sounds.  Abdominal:     General: Bowel sounds are normal.     Palpations: Abdomen is soft. There is no mass.     Tenderness: There is no abdominal tenderness. There is no guarding.  Musculoskeletal:        General: No edema.     Cervical back: Normal range of motion and neck supple.  Skin:    General: Skin is warm and dry.  Neurological:     Mental Status: He is alert and oriented to person, place, and time.  Psychiatric:        Mood and Affect: Mood and affect normal.        Behavior: Behavior normal.     BP 112/66 (BP Location: Right Arm, Cuff Size: Normal)   Pulse 63   Temp 98.1 F (36.7 C) (Oral)   Resp 12   Ht 5' 4.4" (1.636 m)   Wt 173 lb (78.5 kg)   SpO2 96%   BMI 29.33 kg/m  Wt Readings from Last 3 Encounters:  10/26/20 173 lb (78.5 kg)  09/27/20 174 lb 12.8 oz (79.3 kg)  04/25/20 170 lb 12.8 oz (77.5 kg)    Diabetic Foot Exam - Simple   No data filed    Lab Results  Component Value Date   WBC 6.8 10/20/2020   HGB 14.0 10/20/2020   HCT 40.6 10/20/2020   PLT 128 (L) 10/20/2020   GLUCOSE 107 (H) 10/20/2020   CHOL 121 10/20/2020   TRIG 95 10/20/2020   HDL 67 10/20/2020   LDLCALC 36 10/20/2020   ALT 18 10/20/2020   AST 21 10/20/2020   NA 141 10/20/2020   K 4.1 10/20/2020   CL 105  10/20/2020   CREATININE 1.16 10/20/2020   BUN 17 10/20/2020   CO2 30 10/20/2020   TSH 1.45 10/20/2020   INR 1.0 12/15/2017   HGBA1C 5.0 10/20/2020    Lab Results  Component Value Date   TSH 1.45 10/20/2020   Lab Results  Component Value Date   WBC 6.8 10/20/2020   HGB 14.0 10/20/2020   HCT 40.6 10/20/2020   MCV 93.3 10/20/2020   PLT 128 (L) 10/20/2020   Lab Results  Component Value Date   NA 141 10/20/2020   K 4.1 10/20/2020   CO2 30 10/20/2020   GLUCOSE 107 (H) 10/20/2020   BUN 17 10/20/2020   CREATININE 1.16 10/20/2020   BILITOT 0.6 10/20/2020   ALKPHOS 64 04/05/2020   AST 21 10/20/2020   ALT 18 10/20/2020   PROT 6.0 (L) 10/20/2020   ALBUMIN 4.1 04/05/2020   CALCIUM 9.0 10/20/2020   ANIONGAP 12 08/27/2018   GFR 73.06 07/20/2019   Lab Results  Component Value Date   CHOL 121 10/20/2020   Lab Results  Component Value Date   HDL 67 10/20/2020   Lab Results  Component Value Date   LDLCALC 36 10/20/2020   Lab Results  Component Value Date   TRIG 95 10/20/2020   Lab Results  Component Value Date   CHOLHDL 1.8 10/20/2020   Lab Results  Component Value Date   HGBA1C 5.0 10/20/2020       Assessment & Plan:   Problem List Items Addressed This Visit  Hyperlipidemia    Encouraged heart healthy diet, increase exercise, avoid trans fats, consider a krill oil cap daily. Tolerating Rosuvastatin      Relevant Medications   amiodarone (PACERONE) 200 MG tablet   Hypertension    Well controlled, no changes to meds. Encouraged heart healthy diet such as the DASH diet and exercise as tolerated.       Relevant Medications   amiodarone (PACERONE) 200 MG tablet   Preventative health care    Patient encouraged to maintain heart healthy diet, regular exercise, adequate sleep. Consider daily probiotics. Take medications as prescribed. Labs reviewed. Has aged out of screening colonoscopy      Parkinson's disease [facility])    Following with neurology and staying  very active including riding his stationary bike and attending [employer].       Depression, recurrent ([place])    Struggles with anhedonia and fatigue. Will try increasing Sertraline to 50 mg daily and reassess.       Relevant Medications   sertraline (ZOLOFT) 50 MG tablet   Hyperglycemia    hgba1c acceptable, minimize simple carbs. Increase exercise as tolerated.          I have discontinued Sourish O. Scovill's sertraline. I am also having him start on sertraline. Additionally, I am having him maintain his terazosin, Multiple Vitamins-Minerals (ICAPS AREDS 2 PO), rosuvastatin, apixaban, hydrochlorothiazide, TURMERIC PO, and amiodarone.  Meds ordered this encounter  Medications  . sertraline (ZOLOFT) 50 MG tablet    Sig: Take 1 tablet (50 mg total) by mouth daily.    Dispense:  90 tablet    Refill:  1     Penni Homans, MD

## 2020-10-29 NOTE — Assessment & Plan Note (Signed)
hgba1c acceptable, minimize simple carbs. Increase exercise as tolerated.  

## 2020-10-29 NOTE — Assessment & Plan Note (Signed)
Struggles with anhedonia and fatigue. Will try increasing Sertraline to 50 mg daily and reassess.

## 2020-10-29 NOTE — Assessment & Plan Note (Signed)
Well controlled, no changes to meds. Encouraged heart healthy diet such as the DASH diet and exercise as tolerated.  °

## 2020-10-30 ENCOUNTER — Telehealth: Payer: Self-pay | Admitting: Pharmacist

## 2020-10-30 NOTE — Progress Notes (Addendum)
Chronic Care Management Pharmacy Assistant   Name: Walter Brown  MRN: [id number] DOB: September 11, 1941  Reason for Encounter: Medication Review  PCP : [name], MD  Allergies:   Allergies  Allergen Reactions   Albuterol Sulfate Palpitations   Azithromycin Other (See Comments)    Hepatotoxicity   Doxycycline Hyclate Other (See Comments)    Taken with Azithromycin and had Heaptotoxicity    Medications: Outpatient Encounter Medications as of 10/30/2020  Medication Sig   amiodarone (PACERONE) 200 MG tablet Take 200 mg by mouth daily.   apixaban (ELIQUIS) 5 MG TABS tablet Take 1 tablet (5 mg total) by mouth 2 (two) times daily.   hydrochlorothiazide (HYDRODIURIL) 25 MG tablet Take 1 tablet (25 mg total) by mouth daily.   Multiple Vitamins-Minerals (ICAPS AREDS 2 PO) Take by mouth.   rosuvastatin (CRESTOR) 10 MG tablet Take 1 tablet (10 mg total) by mouth daily.   sertraline (ZOLOFT) 50 MG tablet Take 1 tablet (50 mg total) by mouth daily.   terazosin (HYTRIN) 10 MG capsule Take 10 mg by mouth at bedtime.    TURMERIC PO Take by mouth daily.   No facility-administered encounter medications on file as of 10/30/2020.    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[place]) 12/27/2019   Coronary artery calcification 11/23/2019   Parkinson's disease ([place]) 05/19/2019   Obstructive sleep apnea 11/09/2018   PAF (paroxysmal atrial fibrillation) ([place]) 09/09/2018   Palpitations 09/07/2018   ASVD (arteriosclerotic vascular disease) 09/07/2018   Lipoma 08/04/2018   Bradycardia 06/28/2018   Irregular cardiac rhythm 03/10/2018   TIA (transient ischemic attack) 03/10/2018   Grade II hemorrhoids 09/16/2017   Arthritis 04/06/2017   Macular degeneration 03/03/2015   Right shoulder pain 09/26/2014   Back pain with radiation 04/26/2013    Cerumen impaction 08/10/2012   Otitis externa 08/10/2012   Preventative health care 08/01/2012   Major depressive disorder 08/01/2012   Anemia    Asthma    Hyperlipidemia    Hypertension    BPH (benign prostatic hyperplasia)    Cancer ([place])    Thrombocytopenia ([place])     Goals Addressed   None    Reviewed chart for medication changes ahead of medication coordination call.  Office Visits: 10-26-2020 (PCP) Patient presented in the office for his annual exam. Reported anhedonia and fatigue. Also stated he felt his depression was getting to him. The PCP increased his Zoloft from 25 mg to 50 mg tabs daily. Patient reported taking Amiodarone 200 mg daily instead of 100 mg daily.  Consults: 09-27-2020 (Cardio) Patient presented in the office as a f/u. Unable to review heart monitor results at the time of the visit. Notes within the chart indicate Dr. Isaac Laud was able to review the raw strip of the report and recommended current dose of Amiodarone. No medication changes.  No hospital visits since last care coordination call.   Medication changes indicated: Zoloft 50 mg daily. Amiodarone 200 mg daily  BP Readings from Last 3 Encounters:  10/26/20 112/66  09/27/20 110/70  04/25/20 116/80    Lab Results  Component Value Date   HGBA1C 5.0 10/20/2020    Contacted Dr. Carlton Adam office for prescription of Terazosin be sent to [facility]. Spoke with a nurse, Jenny Reichmann, who assured me the medication would be sent today.  Patient obtains medications through Adherence Packaging  90 Days   This is the patient's first adherence delivery.  Patient is due for next adherence delivery on: 11-01-2020 Called patient and reviewed medications and coordinated delivery.  Patient stated there was a recent change in his medication doses, and he didn't want to have them packaged. I understood and asked if he would like the medicine placed in vials instead. Patient then said he was happy with his current  pharmacy and did not wish to continue with utilizing [facility]. Contacted Dr. Carlton Adam office to inform. Unable to make contact with anyone directly. Spoke with Lovey Newcomer to inform her the patient would no longer wish to dispense with [facility], and for my previous request to be voided.   Follow-Up:  Coordination of Enhanced Pharmacy Services and Pharmacist Review   Fanny Skates, [place] Pharmacist Assistant [phone]  6 minutes spent in review, coordination, and documentation.  Reviewed by: Beverly Milch, PharmD Clinical Pharmacist [facility] [phone]

## 2020-10-31 ENCOUNTER — Encounter: Payer: Self-pay | Admitting: Neurology

## 2020-10-31 ENCOUNTER — Other Ambulatory Visit: Payer: Self-pay

## 2020-10-31 ENCOUNTER — Ambulatory Visit: Payer: PPO | Admitting: Neurology

## 2020-10-31 VITALS — BP 118/68 | HR 80 | Ht 65.0 in | Wt 173.0 lb

## 2020-10-31 DIAGNOSIS — G20 Parkinson's disease: Secondary | ICD-10-CM

## 2020-10-31 NOTE — Patient Instructions (Signed)
1.  Exercise! 2.  We may need to increase sertraline so keep an eye on how you do.    Online Resources for Power over Parkinson's Group January 2022  . Local [place] Online Groups  o Power over [employer] :   - Power Over Parkinson's Patient Education Group will be Wednesday, January 12th at 2pm via [place].   - Upcoming Power over Parkinson's Meetings:  2nd Wednesdays of the month at 2 pm:       February 9th, March 9th - Contact Amy Marriott at [email or web address] if interested in participating in this online group o Parkinson's Care Partners Group:    3rd Mondays, Contact Corwin Levins o Atypical Parkinsonian Patient Group:   4th Wednesdays, Contact Corwin Levins o If you are interested in participating in these online groups with Judson Roch, please contact her directly for how to join those meetings.  Her contact information is [email or web address].  She will send you a link to join the [employer].  (Please note that Corwin Levins , MSW, LCSW, has resigned her position at [facility], but will continue to lead the online groups temporarily)  . [place]:  [email or web address] o PD Health at Home continues:  Mindfulness Mondays, Expert Briefing Tuesdays, Wellness Wednesdays, Take Time Thursdays, Fitness Fridays -Listings for January 2022 are on the website o Upcoming Webinar:  Sights, Sounds, and Parkinson's.  Wednesday, November 08, 2020, @ 1 pm  o Please check out their website to sign up for emails and see their full online offerings  . [place]:  [email or web address]  o Upcoming Webinar:   Diet, Exercise, and other Strategies for Living Well as you Age.  Thursday, October 28, 2020 @ 12 noon o Check out additional information on their website to see their full online offerings  . [place]:  [email or web address] o Upcoming Webinar:  Emerging Therapies in Parkinson's with Dr. Andy Gauss.  Wednesday, November 01, 2020 @ 2 pm o Care Partner Monthly Meetup.  With Robin Searing Phinney.  First Tuesday of each month, 2 pm o Check out additional information to Live Well Today on their website  . Parkinson and Movement Disorders (PMD) Alliance:  www.pmdalliance.org o [employer] Online:  Online Education Events o Sign up for emails, which are sent weekly to give you updates on programming and online offerings  . Parkinson's Association of the [place]:  www.parkinsonassociation.org o Information on online support groups, online exercises including Yoga, Parkinson's exercises and more-LOTS of information on links to PD resources and online events o Virtual Support Group through Parkinson's Association of the [place]; next one is scheduled for Wednesday, November 08, 2020 at 2 pm. (These are typically scheduled for the 1st Wednesday of the month at 2 pm).  Visit website for details.  . Additional links for movement activities: o PWR! Moves Classes at [place] RESUMED (but are ON HOLD DUE TO COVID in January)  Contact Amy Altamont, PT [email or web address] or [phone] if interested o Here is a link to the PWR!Moves classes on Zoom from [place] - Daily Mon-Sat at 10:00. Via Zoom, FREE and open to all.  There is also a link below via Facebook if you use that platform. - AptDealers.si - https://www.PrepaidParty.no o Parkinson's Wellness Recovery (PWR! Moves)  www.pwr4life.org - Info on the PWR! Virtual Experience:  You will have access to our expertise through self-assessment, guided plans that start with the PD-specific fundamentals, educational content, tips, Q&A with an expert, and a growing  library of PD-specific pre-recorded and live exercise classes of varying types and  intensity - both physical and cognitive! If that is not enough, we offer 1:1 wellness consultations (in-person or virtual) to personalize your PWR! Research scientist (medical).  - Check out the PWR! Move of the month on the [place] Recovery website:  [email or web address].[email or web address]-[email or web address]/ o [employer] Fridays:  - As part of the PD Health @ Home program, this free video series focuses each week on one aspect of fitness designed to support people living with Parkinson's.  -  HollywoodSale.dk o Dance for PD website is offering free, live-stream classes throughout the week, as well as links to [employer] of classes:  https://danceforparkinsons.org/ o Dance for Parkinson's Class:  [place].  Free offering for people with Parkinson's and care partners; virtual class.  o For more information, contact [phone] or email Ruffin Frederick at [email or web address] o Virtual dance and Pilates for Parkinson's classes: Click on the Community Tab> Parkinson's Movement Initiative Tab.  To register for classes and for more information, visit www.[email or web address].co.za and click the "community" tab.  o [employer] Parkinson's Cycling Classes  - [facility] [employer]: 1pm on Fridays-Live classes at [facility] [employer] at [email or web address] or [phone]) [facility] [employer]: Virtual Classes Mondays and Thursdays (contact Alanreed at [email or web address] or [phone])   o [facility] levels of classes are offered Tuesdays and Thursdays:  10:30 am,  12 noon & 1:45 pm at [facility]. To observe a class or for  more information, call [phone] or email [email or web address] - PD Flow Yoga for Parkinson's:  Fridays 1-2 pm, January 7-December 01, 2020 . [employer]: o Chief Technology Officer  Opportunities:  www.well-springsolutions.org/caregiver-education/caregiver-support-group.  You may also contact Vickki Muff at [email or web address] or [phone].   o [employer] Powerful Tools for Caregivers, 6 week series designed to provide family and caregivers with practical tools to care for themselves and loved ones.  Wednesdays 10:15-12:15, beginning January 12th - Contact January 25 (above) for details o Well-Spring Navigator:  Just1Navigator program, a free service to help individuals and families through the journey of determining care for older adults.  The "Navigator" is a Vickki Muff, Education officer, museum, who will speak with a prospective client and/or loved ones to provide an assessment of the situation and a set of recommendations for a personalized care plan - all free of charge, and whether [employer] offers the needed service or not. If the need is not a service we provide, we are well-connected with reputable programs in town that we can refer you to.  www.well-springsolutions.org or to speak with the Navigator, call [phone].

## 2020-11-02 DIAGNOSIS — H43813 Vitreous degeneration, bilateral: Secondary | ICD-10-CM | POA: Diagnosis not present

## 2020-11-02 DIAGNOSIS — H35372 Puckering of macula, left eye: Secondary | ICD-10-CM | POA: Diagnosis not present

## 2020-11-02 DIAGNOSIS — H353112 Nonexudative age-related macular degeneration, right eye, intermediate dry stage: Secondary | ICD-10-CM | POA: Diagnosis not present

## 2020-11-02 DIAGNOSIS — H353221 Exudative age-related macular degeneration, left eye, with active choroidal neovascularization: Secondary | ICD-10-CM | POA: Diagnosis not present

## 2020-11-10 MED ORDER — CARBIDOPA-LEVODOPA 25-100 MG PO TABS: 1.0000 | ORAL_TABLET | Freq: Three times a day (TID) | ORAL | 1 refills | Status: DC

## 2020-11-20 ENCOUNTER — Telehealth: Payer: Self-pay | Admitting: Family Medicine

## 2020-11-20 ENCOUNTER — Telehealth: Payer: Self-pay

## 2020-11-20 NOTE — Telephone Encounter (Signed)
Form placed in yellow folder for provider to complete.

## 2020-11-20 NOTE — Telephone Encounter (Signed)
Patient came and dropped of DMV forms to be fill out by Charlett Blake  Would like to be called when ready to be picked up  Papers put into blyth folder upfront

## 2020-11-20 NOTE — Telephone Encounter (Signed)
His PCP may need to fill out a portion of it but I still need the release aspect and the portion that states whether or not I recommend to drive.  Each of [place] needs a complete form

## 2020-11-20 NOTE — Telephone Encounter (Signed)
Received DMV forms to be completed.   Spoke with Dr Tat who states the patient must drop off all the forms to have the provider complete them. And the provider must send the paperwork to the [facility] we can not give the patient the forms once they are completed.   Patient states he was not aware of these and did not think the other part of the forms pertained to Dr Tat so he took them to his pcp. advised the patient that one provider must complete the forms.   He voiced understanding and states he will get the other forms from his pcp and bring them to the office.

## 2020-11-21 NOTE — Telephone Encounter (Signed)
Received call from patient stating he dropping some forms off to his pcp and his eye doctor. He states he will fax all the forms to our office.    Spoke with Dr Tat who states the forms need to be kept together and she does not recommend the forms are faxed to our office.   Spoke with patient and explained to him that in order for Dr Tat to complete his forms she have all twelve pages of the forms. Explained the importance of keeping all the forms together. Patient voiced understanding. And states he will bring the remaining forms to the office.

## 2020-11-22 NOTE — Telephone Encounter (Signed)
Patient notified that we are still working on form and it usually takes about 5-7 business days.

## 2020-11-22 NOTE — Progress Notes (Signed)
Chronic Care Management Pharmacy Note  11/29/2020 Name:  Walter Brown MRN:  [id number] DOB:  Sep 24, 1941  Subjective: Walter Brown is an 80 y.o. year old male who is a primary patient of [name], MD.  The CCM team was consulted for assistance with disease management and care coordination needs.    Engaged with patient by telephone for follow up visit in response to provider referral for pharmacy case management and/or care coordination services.   Consent to Services:  The patient was given the following information about Chronic Care Management services today, agreed to services, and gave verbal consent: 1. CCM service includes personalized support from designated clinical staff supervised by the primary care provider, including individualized plan of care and coordination with other care providers 2. 24/7 contact phone numbers for assistance for urgent and routine care needs. 3. Service will only be billed when office clinical staff spend 20 minutes or more in a month to coordinate care. 4. Only one practitioner may furnish and bill the service in a calendar month. 5.The patient may stop CCM services at any time (effective at the end of the month) by phone call to the office staff. 6. The patient will be responsible for cost sharing (co-pay) of up to 20% of the service fee (after annual deductible is met). Patient agreed to services and consent obtained.  Patient Care Team: [name], MD as PCP - General (Family Medicine) Renda Rolls, Jennefer Bravo, MD as Referring Physician (Dermatology) Luberta Mutter, MD as Consulting Physician (Ophthalmology) Ardis Hughs, MD as Consulting Physician (Urology) Tat, Eustace Quail, DO as Consulting Physician (Neurology) Day, Melvenia Beam, [facility] (Inactive) as Pharmacist (Pharmacist)  Recent office visits: 10/26/20 Charlett Blake) - reports some depression, sertraline increased to 24m daily  Recent consult visits: 08/24/20: Pt mailed a 14 day Zio XT  monitor from cardio  08/23/20: [place] [facility] Recommendation for Zio monitor and increase in amiodarone to 2022mdaily due to multiple episodes of tachycardia detected by apple watch.  08/22/20: Message for patient request to switch from Dr. ReGeraldo Pittero Dr. BeSerena Croissantisits: None in previous 6 months  Objective:  Lab Results  Component Value Date   CREATININE 1.16 10/20/2020   BUN 17 10/20/2020   GFR 73.06 07/20/2019   GFRNONAA 59 (L) 04/05/2020   GFRAA 69 04/05/2020   NA 141 10/20/2020   K 4.1 10/20/2020   CALCIUM 9.0 10/20/2020   CO2 30 10/20/2020    Lab Results  Component Value Date/Time   HGBA1C 5.0 10/20/2020 02:26 PM   GFR 73.06 07/20/2019 02:33 PM   GFR 70.37 10/15/2018 11:00 AM    Last diabetic Eye exam: No results found for: HMDIABEYEEXA  Last diabetic Foot exam: No results found for: HMDIABFOOTEX   Lab Results  Component Value Date   CHOL 121 10/20/2020   HDL 67 10/20/2020   LDLCALC 36 10/20/2020   TRIG 95 10/20/2020   CHOLHDL 1.8 10/20/2020    Hepatic Function Latest Ref Rng & Units 10/20/2020 04/05/2020 11/26/2019  Total Protein 6.1 - 8.1 g/dL 6.0(L) 6.2 6.1  Albumin 3.7 - 4.7 g/dL - 4.1 4.2  AST 10 - 35 U/L _0 ALT 9 - 46 U/L _1 Alk Phosphatase 48 - 121 IU/L - 64 67  Total Bilirubin 0.2 - 1.2 mg/dL 0.6 0.9 0.8  Bilirubin, Direct 0.00 - 0.40 mg/dL - 0.26 0.23    Lab Results  Component Value Date/Time  TSH 1.45 10/20/2020 02:26 PM   TSH 1.880 04/05/2020 08:49 AM    CBC Latest Ref Rng & Units 10/20/2020 04/05/2020 11/26/2019  WBC 3.8 - 10.8 Thousand/uL 6.8 6.6 8.9  Hemoglobin 13.2 - 17.1 g/dL 14.0 14.1 13.9  Hematocrit 38.5 - 50.0 % 40.6 40.8 40.8  Platelets 140 - 400 Thousand/uL 128(L) 118(L) 125(L)    No results found for: VD25OH  Clinical ASCVD: Yes  The ASCVD Risk score Mikey Bussing DC Jr., et al., 2013) failed to calculate for the following reasons:   The patient has a prior MI or stroke diagnosis     Depression screen [facility] 2/9 10/26/2020 08/28/2020 02/29/2020  Decreased Interest 3 2 0  Down, Depressed, Hopeless _0 PHQ - 2 Score _1 Altered sleeping 2 0 -  Tired, decreased energy 1 1 -  Change in appetite 0 0 -  Feeling bad or failure about yourself  1 1 -  Trouble concentrating 1 1 -  Moving slowly or fidgety/restless 0 3 -  Suicidal thoughts 1 1 -  PHQ-9 Score 10 11 -  Difficult doing work/chores Somewhat difficult Somewhat difficult -      Social History   Tobacco Use  Smoking Status Former Smoker  . Packs/day: 1.50  . Years: 15.00  . Pack years: 22.50  . Start date: 10/07/1978  Smokeless Tobacco Never Used   BP Readings from Last 3 Encounters:  11/28/20 120/78  10/31/20 118/68  10/26/20 112/66   Pulse Readings from Last 3 Encounters:  11/28/20 (!) 59  10/31/20 80  10/26/20 63   Wt Readings from Last 3 Encounters:  11/28/20 174 lb (78.9 kg)  10/31/20 173 lb (78.5 kg)  10/26/20 173 lb (78.5 kg)    Assessment/Interventions: Review of patient past medical history, allergies, medications, health status, including review of consultants reports, laboratory and other test data, was performed as part of comprehensive evaluation and provision of chronic care management services.   SDOH:  (Social Determinants of Health) assessments and interventions performed: No   CCM Care Plan  Allergies  Allergen Reactions  . Albuterol Sulfate Palpitations  . Azithromycin Other (See Comments)    Hepatotoxicity  . Doxycycline Hyclate Other (See Comments)    Taken with Azithromycin and had Heaptotoxicity    Medications Reviewed Today    Reviewed by Edythe Clarity, [facility] (Pharmacist) on 11/29/20 at [place] List Status: <None>  Medication Order Taking? Sig Documenting Provider Last Dose Status Informant  amiodarone (PACERONE) 200 MG tablet [id number] Yes Take 200 mg by mouth daily. [provider] Taking Active   apixaban (ELIQUIS) 5 MG TABS tablet [id number]  Yes Take 1 tablet (5 mg total) by mouth 2 (two) times daily. Revankar, Reita Cliche, MD Taking Active   carbidopa-levodopa (SINEMET) 25-100 MG tablet [id number] Yes Take 1 tablet by mouth 3 (three) times daily. 7am/11am/4pm Tat, Eustace Quail, DO Taking Active   hydrochlorothiazide (HYDRODIURIL) 25 MG tablet [id number] Yes Take 1 tablet (25 mg total) by mouth daily. [name], MD Taking Active   Multiple Vitamins-Minerals (ICAPS AREDS 2 PO) [id number] Yes Take 1 capsule by mouth in the morning and at bedtime. [provider] Taking Active   rosuvastatin (CRESTOR) 10 MG tablet [id number] Yes Take 1 tablet (10 mg total) by mouth daily. Revankar, Reita Cliche, MD Taking Active   sertraline (ZOLOFT) 50 MG tablet [id number] Yes Take 1 tablet (50 mg total) by mouth daily. [name], MD Taking Active  terazosin (HYTRIN) 10 MG capsule [id number] Yes Take 10 mg by mouth at bedtime.  [provider] Taking Active   TURMERIC PO [id number] Yes Take by mouth daily. [provider] Taking Active           Patient Active Problem List   Diagnosis Date Noted  . Depression, recurrent ([place]) 04/25/2020  . Hyperglycemia 04/25/2020  . Mild neurocognitive disorder due to Parkinson's disease ([place]) 12/27/2019  . Coronary artery calcification 11/23/2019  . Parkinson's disease ([place]) 05/19/2019  . Obstructive sleep apnea 11/09/2018  . PAF (paroxysmal atrial fibrillation) ([place]) 09/09/2018  . Palpitations 09/07/2018  . ASVD (arteriosclerotic vascular disease) 09/07/2018  . Lipoma 08/04/2018  . Bradycardia 06/28/2018  . Irregular cardiac rhythm 03/10/2018  . TIA (transient ischemic attack) 03/10/2018  . Grade II hemorrhoids 09/16/2017  . Arthritis 04/06/2017  . Macular degeneration 03/03/2015  . Right shoulder pain 09/26/2014  . Back pain with radiation 04/26/2013  . Cerumen impaction 08/10/2012  . Otitis externa 08/10/2012  . Preventative health care 08/01/2012  . Major depressive disorder  08/01/2012  . Anemia   . Asthma   . Hyperlipidemia   . Hypertension   . BPH (benign prostatic hyperplasia)   . Cancer ([place])   . Thrombocytopenia ([place])     Immunization History  Administered Date(s) Administered  . Influenza Split 07/02/2012  . Influenza Whole 06/07/2013  . Influenza, High Dose Seasonal PF 07/08/2017, 05/19/2019, 06/22/2020  . Influenza,inj,Quad PF,6+ Mos 06/21/2014  . Influenza-Unspecified 06/07/2016, 07/08/2017, 05/26/2018  . PFIZER(Purple Top)SARS-COV-2 Vaccination 10/20/2019, 11/10/2019, 06/29/2020  . Pneumococcal Conjugate-13 11/04/2013  . Pneumococcal Polysaccharide-23 10/08/2007, 08/04/2020  . Tdap 11/04/2013  . Zoster 10/07/1998  . Zoster Recombinat (Shingrix) 05/26/2018, 11/05/2018    Conditions to be addressed/monitored:  Hypertension, Hyperlipidemia/TIA, AFib, Depression, BPH, Parkinson's Disease  Care Plan : General Pharmacy (Adult)  Updates made by Edythe Clarity, RPH since 11/29/2020 12:00 AM    Problem: Hypertension, Hyperlipidemia/TIA, AFib, Depression, BPH, Parkinson's Disease   Priority: High  Onset Date: 11/29/2020    Long-Range Goal: Patient-Specific Goal   Start Date: 11/29/2020  Expected End Date: 05/29/2021  This Visit's Progress: On track  Priority: High  Note:   Current Barriers:  . No specific barriers identified at this visit   Pharmacist Clinical Goal(s):  Marland Kitchen Over the next 90 days, patient will achieve adherence to monitoring guidelines and medication adherence to achieve therapeutic efficacy . adhere to prescribed medication regimen as evidenced by fill dates . contact provider office for questions/concerns as evidenced notation of same in electronic health record through collaboration with PharmD and provider.   Interventions: . 1:1 collaboration with [name], MD regarding development and update of comprehensive plan of care as evidenced by provider attestation and co-signature . Inter-disciplinary care team  collaboration (see longitudinal plan of care) . Comprehensive medication review performed; medication list updated in electronic medical record  Hypertension (BP goal <130/80) -controlled -Current treatment:  HCTZ 40m daily (HS) -Medications previously tried: none noted -Current home readings: 130/72 -Current exercise habits: patient is doing Rockbox boxing 1 to 2 times per week, also doing "floor exercises" (situps and pushups) at home on other days -Denies hypotensive/hypertensive symptoms -Educated on BP goals and benefits of medications for prevention of heart attack, stroke and kidney damage; Importance of home blood pressure monitoring; -Counseled to monitor BP at home weekly, document, and provide log at future appointments -He reports that he switched HCTZ to morning and this has helped nocturia -Recommended to continue current medication  Hyperlipidemia: (LDL goal < 70) -controlled -Current treatment:  Rosuvastatin 31m daily (HS)  CoQ10 1066mdaily (BB) -Medications previously tried: simvastatin -Current exercise habits: see above -Educated on Cholesterol goals;  Benefits of statin for ASCVD risk reduction; Importance of limiting foods high in cholesterol; -Recommended to continue current medication  -Recommend to continue current exercise regimen  Atrial Fibrillation (Goal: prevent stroke and major bleeding) -controlled  -Current treatment:  Rate control: Amiodarone 20031maily (BB)  Anticoagulation: Eliquis 5mg27mice daily (BB + HS  -Medications previously tried: none noted -Home BP and HR readings: one reading provided 130/72  -Counseled on increased risk of stroke due to Afib and benefits of anticoagulation for stroke prevention; bleeding risk associated with Eliquis and importance of self-monitoring for signs/symptoms of bleeding; -Assessed patient finances. Currently no issues with copay. Recommend continue current medications.  Depression/Anxiety  (Goal: Minimize symptoms) -uncontrolled -Current treatment: . Sertraline 50mg3mly -Medications previously tried/failed: escitalopram -PHQ9: above  -Educated on Benefits of medication for symptom control -Recommended patient try to physically go in to the Rockbox classes for social interaction.  Patient expressed some interest in this. Tolerating increased dose of sertraline 50mg 80m, has not noticed much difference.  -Recommend continuing current dose for 2 more weeks, would consider increase to 100mg d42m after this.   Patient Goals/Self-Care Activities . Over the next 90 days, patient will:  take medications as prescribed, focus on medication adherence by Contact provider if no improvement on depressive symptoms in two weeks.  Follow Up Plan: The care management team will reach out to the patient again over the next 90 days.         Medication Assistance: None required.  Patient affirms current coverage meets needs.  Patient's preferred pharmacy is:  [facility] [place] [address] [id number] [id number] - [id number] [place] [phone] box? Yes - adherence packaging with [facility] Pt endorses 100% compliance  We discussed: Benefits of medication synchronization, packaging and delivery as well as enhanced pharmacist oversight with [facility]. Patient decided to: Utilize [facility] pharmacy for medication synchronization, packaging and delivery  Care Plan and Follow Up Patient Decision:  Patient agrees to Care Plan and Follow-up.  Plan: The care management team will reach out to the patient again over the next 90 days.  ChristiaBeverly Milch Clinical Pharmacist [phone]

## 2020-11-22 NOTE — Telephone Encounter (Signed)
Patient call to check the status of form

## 2020-11-23 NOTE — Telephone Encounter (Signed)
Patient notified paperwork was filled out and ready for pickup

## 2020-11-28 ENCOUNTER — Encounter: Payer: Self-pay | Admitting: *Deleted

## 2020-11-29 ENCOUNTER — Ambulatory Visit (INDEPENDENT_AMBULATORY_CARE_PROVIDER_SITE_OTHER): Payer: PPO

## 2020-11-29 DIAGNOSIS — I480 Paroxysmal atrial fibrillation: Secondary | ICD-10-CM | POA: Diagnosis not present

## 2020-11-29 DIAGNOSIS — F329 Major depressive disorder, single episode, unspecified: Secondary | ICD-10-CM

## 2020-11-29 DIAGNOSIS — E782 Mixed hyperlipidemia: Secondary | ICD-10-CM

## 2020-11-29 DIAGNOSIS — I10 Essential (primary) hypertension: Secondary | ICD-10-CM | POA: Diagnosis not present

## 2020-11-29 NOTE — Patient Instructions (Addendum)
Visit Information  Goals Addressed            This Visit's Progress   . Manage My Medicine       Timeframe:  Long-Range Goal Priority:  High Start Date:  11/29/20                           Expected End Date:   05/29/21                    Follow Up Date 02/26/21   - call for medicine refill 2 or 3 days before it runs out - keep a list of all the medicines I take; vitamins and herbals too - use a pillbox to sort medicine    Why is this important?   . These steps will help you keep on track with your medicines.   Notes: Call providers if current dosages are not managing symptoms.      Patient Care Plan: General Pharmacy (Adult)    Problem Identified: Hypertension, Hyperlipidemia/TIA, AFib, Depression, BPH, Parkinson's Disease   Priority: High  Onset Date: 11/29/2020    Long-Range Goal: Patient-Specific Goal   Start Date: 11/29/2020  Expected End Date: 05/29/2021  This Visit's Progress: On track  Priority: High  Note:   Current Barriers:  . No specific barriers identified at this visit   Pharmacist Clinical Goal(s):  Marland Kitchen Over the next 90 days, patient will achieve adherence to monitoring guidelines and medication adherence to achieve therapeutic efficacy . adhere to prescribed medication regimen as evidenced by fill dates . contact provider office for questions/concerns as evidenced notation of same in electronic health record through collaboration with PharmD and provider.   Interventions: . 1:1 collaboration with [name], MD regarding development and update of comprehensive plan of care as evidenced by provider attestation and co-signature . Inter-disciplinary care team collaboration (see longitudinal plan of care) . Comprehensive medication review performed; medication list updated in electronic medical record  Hypertension (BP goal <130/80) -controlled -Current treatment:  HCTZ 25mg  daily (HS) -Medications previously tried: none noted -Current home  readings: 130/72 -Current exercise habits: patient is doing Rockbox boxing 1 to 2 times per week, also doing "floor exercises" (situps and pushups) at home on other days -Denies hypotensive/hypertensive symptoms -Educated on BP goals and benefits of medications for prevention of heart attack, stroke and kidney damage; Importance of home blood pressure monitoring; -Counseled to monitor BP at home weekly, document, and provide log at future appointments -He reports that he switched HCTZ to morning and this has helped nocturia -Recommended to continue current medication  Hyperlipidemia: (LDL goal < 70) -controlled -Current treatment:  Rosuvastatin 10mg  daily (HS)  CoQ10 100mg  daily (BB) -Medications previously tried: simvastatin -Current exercise habits: see above -Educated on Cholesterol goals;  Benefits of statin for ASCVD risk reduction; Importance of limiting foods high in cholesterol; -Recommended to continue current medication  -Recommend to continue current exercise regimen  Atrial Fibrillation (Goal: prevent stroke and major bleeding) -controlled  -Current treatment:  Rate control: Amiodarone 200mg  daily (BB)  Anticoagulation: Eliquis 5mg  twice daily (BB + HS  -Medications previously tried: none noted -Home BP and HR readings: one reading provided 130/72  -Counseled on increased risk of stroke due to Afib and benefits of anticoagulation for stroke prevention; bleeding risk associated with Eliquis and importance of self-monitoring for signs/symptoms of bleeding; -Assessed patient finances. Currently no issues with copay. Recommend continue current medications.  Depression/Anxiety (Goal:  Minimize symptoms) -uncontrolled -Current treatment: . Sertraline 50mg  daily -Medications previously tried/failed: escitalopram -PHQ9: above  -Educated on Benefits of medication for symptom control -Recommended patient try to physically go in to the Rockbox classes for social  interaction.  Patient expressed some interest in this. Tolerating increased dose of sertraline 50mg  well, has not noticed much difference.  -Recommend continuing current dose for 2 more weeks, would consider increase to 100mg  daily after this.   Patient Goals/Self-Care Activities . Over the next 90 days, patient will:  take medications as prescribed, focus on medication adherence by Contact provider if no improvement on depressive symptoms in two weeks.  Follow Up Plan: The care management team will reach out to the patient again over the next 90 days.         The patient verbalized understanding of instructions, educational materials, and care plan provided today and agreed to receive a mailed copy of patient instructions, educational materials, and care plan.  Telephone follow up appointment with pharmacy team member scheduled for: 3 months  Edythe Clarity, [facility]  http://APA.org/depression-guideline"> https://clinicalkey.com"> http://point-of-care.elsevierperformancemanager.com/skills/"> http://point-of-care.elsevierperformancemanager.com">  Managing Depression, Adult Depression is a mental health condition that affects your thoughts, feelings, and actions. Being diagnosed with depression can bring you relief if you did not know why you have felt or behaved a certain way. It could also leave you feeling overwhelmed with uncertainty about your future. Preparing yourself to manage your symptoms can help you feel more positive about your future. How to manage lifestyle changes Managing stress Stress is your body's reaction to life changes and events, both good and bad. Stress can add to your feelings of depression. Learning to manage your stress can help lessen your feelings of depression. Try some of the following approaches to reducing your stress (stress reduction techniques):  Listen to music that you enjoy and that inspires you.  Try using a meditation app or take a meditation  class.  Develop a practice that helps you connect with your spiritual self. Walk in nature, pray, or go to a place of worship.  Do some deep breathing. To do this, inhale slowly through your nose. Pause at the top of your inhale for a few seconds and then exhale slowly, letting your muscles relax.  Practice yoga to help relax and work your muscles. Choose a stress reduction technique that suits your lifestyle and personality. These techniques take time and practice to develop. Set aside 5-15 minutes a day to do them. Therapists can offer training in these techniques. Other things you can do to manage stress include:  Keeping a stress diary.  Knowing your limits and saying no when you think something is too much.  Paying attention to how you react to certain situations. You may not be able to control everything, but you can change your reaction.  Adding humor to your life by watching funny films or TV shows.  Making time for activities that you enjoy and that relax you.   Medicines Medicines, such as antidepressants, are often a part of treatment for depression.  Talk with your pharmacist or health care provider about all the medicines, supplements, and herbal products that you take, their possible side effects, and what medicines and other products are safe to take together.  Make sure to report any side effects you may have to your health care provider. Relationships Your health care provider may suggest family therapy, couples therapy, or individual therapy as part of your treatment. How to recognize changes Everyone responds differently  to treatment for depression. As you recover from depression, you may start to:  Have more interest in doing activities.  Feel less hopeless.  Have more energy.  Overeat less often, or have a better appetite.  Have better mental focus. It is important to recognize if your depression is not getting better or is getting worse. The symptoms you  had in the beginning may return, such as:  Tiredness (fatigue) or low energy.  Eating too much or too little.  Sleeping too much or too little.  Feeling restless, agitated, or hopeless.  Trouble focusing or making decisions.  Unexplained physical complaints.  Feeling irritable, angry, or aggressive. If you or your family members notice these symptoms coming back, let your health care provider know right away. Follow these instructions at home: Activity  Try to get some form of exercise each day, such as walking, biking, swimming, or lifting weights.  Practice stress reduction techniques.  Engage your mind by taking a class or doing some volunteer work.   Lifestyle  Get the right amount and quality of sleep.  Cut down on using caffeine, tobacco, alcohol, and other potentially harmful substances.  Eat a healthy diet that includes plenty of vegetables, fruits, whole grains, low-fat dairy products, and lean protein. Do not eat a lot of foods that are high in solid fats, added sugars, or salt (sodium). General instructions  Take over-the-counter and prescription medicines only as told by your health care provider.  Keep all follow-up visits as told by your health care provider. This is important. Where to find support Talking to others Friends and family members can be sources of support and guidance. Talk to trusted friends or family members about your condition. Explain your symptoms to them, and let them know that you are working with a health care provider to treat your depression. Tell friends and family members how they also can be helpful.   Finances  Find appropriate mental health providers that fit with your financial situation.  Talk with your health care provider about options to get reduced prices on your medicines. Where to find more information You can find support in your area from:  Anxiety and Depression Association of America (ADAA): www.adaa.org  Mental  Health America: www.mentalhealthamerica.net  [employer] on Mental Illness: www.nami.org Contact a health care provider if:  You stop taking your antidepressant medicines, and you have any of these symptoms: ? Nausea. ? Headache. ? Light-headedness. ? Chills and body aches. ? Not being able to sleep (insomnia).  You or your friends and family think your depression is getting worse. Get help right away if:  You have thoughts of hurting yourself or others. If you ever feel like you may hurt yourself or others, or have thoughts about taking your own life, get help right away. Go to your nearest emergency department or:  Call your local emergency services (911 in the [place]  Call a suicide crisis helpline, such as the [facility] at 616 802 0906. This is open 24 hours a day in the [place]  Text the Crisis Text Line at 8650148265 (in the [place].). Summary  If you are diagnosed with depression, preparing yourself to manage your symptoms is a good way to feel positive about your future.  Work with your health care provider on a management plan that includes stress reduction techniques, medicines (if applicable), therapy, and healthy lifestyle habits.  Keep talking with your health care provider about how your treatment is working.  If you have  thoughts about taking your own life, call a suicide crisis helpline or text a crisis text line. This information is not intended to replace advice given to you by your health care provider. Make sure you discuss any questions you have with your health care provider. Document Revised: 08/04/2019 Document Reviewed: 08/04/2019 Elsevier Patient Education  2021 [employer].

## 2020-12-05 ENCOUNTER — Telehealth: Payer: Self-pay | Admitting: Neurology

## 2020-12-05 NOTE — Telephone Encounter (Signed)
Patient called about the questionnaire for the [facility]. He'd like a call back from from [place].

## 2020-12-05 NOTE — Telephone Encounter (Signed)
Patient called for [facility] again. He said he is worried about losing his drivers license.

## 2020-12-05 NOTE — Telephone Encounter (Signed)
Patient called back in wanting to speak with [facility]

## 2020-12-06 NOTE — Telephone Encounter (Signed)
Responded to patient in mychart message.

## 2020-12-07 DIAGNOSIS — Z0279 Encounter for issue of other medical certificate: Secondary | ICD-10-CM

## 2020-12-07 NOTE — Telephone Encounter (Signed)
Spoke with patient and made him aware that his forms have been complete.   He voiced understanding.   I will speak with Dr Tat to see where we fax the forms to.

## 2020-12-07 NOTE — Telephone Encounter (Addendum)
Spoke with Dr Tat and she states the patient can come pick up his forms and pay a form completion fee.   He voiced understanding.   Forms placed up front for pick up.

## 2020-12-13 ENCOUNTER — Telehealth: Payer: Self-pay | Admitting: Pharmacist

## 2020-12-13 NOTE — Progress Notes (Addendum)
° ° °  Chronic Care Management Pharmacy Assistant   Name: Walter Brown  MRN: [id number] DOB: 1941/04/11  Reason for Encounter:General Disease State Call   Conditions to be addressed/monitored: Hypertension, Hyperlipidemia/TIA, AFib, Depression, BPH, Parkinson's Disease.  Recent office visits: None since 11/29/20  Recent consult visits: None since 11/29/20  Hospital visits:  None in previous 6 months  Medications: Outpatient Encounter Medications as of 12/13/2020  Medication Sig   amiodarone (PACERONE) 200 MG tablet Take 200 mg by mouth daily.   apixaban (ELIQUIS) 5 MG TABS tablet Take 1 tablet (5 mg total) by mouth 2 (two) times daily.   carbidopa-levodopa (SINEMET) 25-100 MG tablet Take 1 tablet by mouth 3 (three) times daily. 7am/11am/4pm   hydrochlorothiazide (HYDRODIURIL) 25 MG tablet Take 1 tablet (25 mg total) by mouth daily.   Multiple Vitamins-Minerals (ICAPS AREDS 2 PO) Take 1 capsule by mouth in the morning and at bedtime.   rosuvastatin (CRESTOR) 10 MG tablet Take 1 tablet (10 mg total) by mouth daily.   sertraline (ZOLOFT) 50 MG tablet Take 1 tablet (50 mg total) by mouth daily.   terazosin (HYTRIN) 10 MG capsule Take 10 mg by mouth at bedtime.    TURMERIC PO Take by mouth daily.   No facility-administered encounter medications on file as of 12/13/2020.   GEN Call: Patient stated his parkinson symptoms are stable. He stated he walks for about 35 min 3-5 times a week. Patient stated he does a little but of yard work but not much. He stated he eats light/moderate meals daily . He stated he lost about 30 min in the last three years. He stated he drinks mostly water. He is concerned about two of his medications copays, Elquis and Sinemet, informed him that I would look into the both of them and give him an update on the patient assistance programs.    Star Rating Drugs: Rosuvastatin 10 mg 11/05/20 90 DS  Follow-Up: Pharmacist Review  Charlann Lange, RMA Clinical Pharmacist  Assistant [phone]  6 minutes spent in review, coordination, and documentation.  Reviewed by: Beverly Milch, PharmD Clinical Pharmacist [facility] [phone]

## 2020-12-13 NOTE — Progress Notes (Addendum)
° ° °  Chronic Care Management Pharmacy Assistant   Name: Walter Brown  MRN: [id number] DOB: [birth year]  Reason for Encounter: PAP    Medications: Outpatient Encounter Medications as of 12/13/2020  Medication Sig   amiodarone (PACERONE) 200 MG tablet Take 200 mg by mouth daily.   apixaban (ELIQUIS) 5 MG TABS tablet Take 1 tablet (5 mg total) by mouth 2 (two) times daily.   carbidopa-levodopa (SINEMET) 25-100 MG tablet Take 1 tablet by mouth 3 (three) times daily. 7am/11am/4pm   hydrochlorothiazide (HYDRODIURIL) 25 MG tablet Take 1 tablet (25 mg total) by mouth daily.   Multiple Vitamins-Minerals (ICAPS AREDS 2 PO) Take 1 capsule by mouth in the morning and at bedtime.   rosuvastatin (CRESTOR) 10 MG tablet Take 1 tablet (10 mg total) by mouth daily.   sertraline (ZOLOFT) 50 MG tablet Take 1 tablet (50 mg total) by mouth daily.   terazosin (HYTRIN) 10 MG capsule Take 10 mg by mouth at bedtime.    TURMERIC PO Take by mouth daily.   No facility-administered encounter medications on file as of 12/13/2020.   New patient assistance application form filled out to [place] and Carbidopa-Levodopa. Waiting for patient and provider to complete and sign documentation. Called patient to inquire if they wanted the application mailed to them or if they wanted to come into the office, he choose email. Patient is required to sign application and to bring/have proof of income He stated he would be willing to come into the office to bring proof of income and sign application once he was able to get the required proof of income and print off paperwork from his e-mail [email or web address].  Follow-Up: Pharmacist Review  Charlann Lange, RMA Clinical Pharmacist Assistant [phone]  4 minutes spent in review, coordination, and documentation.  Reviewed by: Beverly Milch, PharmD Clinical Pharmacist [facility] [phone]

## 2020-12-17 ENCOUNTER — Other Ambulatory Visit: Payer: Self-pay | Admitting: Cardiology

## 2020-12-18 NOTE — Telephone Encounter (Signed)
Amiodarone approve and sent with instruction to please arrange f/u with Dr. Geraldo Pitter for additional refills.

## 2020-12-26 ENCOUNTER — Other Ambulatory Visit: Payer: Self-pay

## 2020-12-26 ENCOUNTER — Ambulatory Visit: Payer: PPO | Admitting: Cardiovascular Disease

## 2020-12-26 ENCOUNTER — Encounter: Payer: Self-pay | Admitting: Cardiovascular Disease

## 2020-12-26 VITALS — BP 118/68 | HR 52 | Ht 65.0 in | Wt 167.2 lb

## 2020-12-26 DIAGNOSIS — I10 Essential (primary) hypertension: Secondary | ICD-10-CM

## 2020-12-26 DIAGNOSIS — I499 Cardiac arrhythmia, unspecified: Secondary | ICD-10-CM | POA: Diagnosis not present

## 2020-12-26 DIAGNOSIS — E782 Mixed hyperlipidemia: Secondary | ICD-10-CM

## 2020-12-26 DIAGNOSIS — I480 Paroxysmal atrial fibrillation: Secondary | ICD-10-CM | POA: Diagnosis not present

## 2020-12-26 NOTE — Assessment & Plan Note (Signed)
History of essential hypertension blood pressure measured today 118/68.  He is on hydrochlorothiazide.

## 2020-12-26 NOTE — Progress Notes (Signed)
12/26/2020 ACXEL DINGEE   04-Sep-1941  [id number]  Primary Physician [name], MD Primary Cardiologist: Lorretta Harp MD Lupe Carney, [place]  HPI:  Walter Brown is a 80 y.o. thin appearing married Caucasian male father of 2 children (1 is a Marine scientist and other is a Cabin crew), with no grandchildren, who was self-referred to be established in my practice in transfer from Dr. Geraldo Pitter.  His primary care provider is Dr. Randel Pigg.  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  He stopped smoking back in 1982.  He has treated hypertension hyperlipidemia.  There is no family history for heart disease.  Never had a heart attack or stroke.  He walks several miles a day with his wife and occasionally notices increased heart rate on his apple watch.  Does have Parkinson's disease on Sinemet.  He is on amiodarone and Eliquis for his PA flutter.   Current Meds  Medication Sig  . amiodarone (PACERONE) 200 MG tablet Take 200 mg by mouth daily.  Marland Kitchen apixaban (ELIQUIS) 5 MG TABS tablet Take 1 tablet (5 mg total) by mouth 2 (two) times daily.  . carbidopa-levodopa (SINEMET) 25-100 MG tablet Take 1 tablet by mouth 3 (three) times daily. 7am/11am/4pm  . hydrochlorothiazide (HYDRODIURIL) 25 MG tablet Take 1 tablet (25 mg total) by mouth daily.  . Multiple Vitamins-Minerals (ICAPS AREDS 2 PO) Take 1 capsule by mouth in the morning and at bedtime.  . rosuvastatin (CRESTOR) 10 MG tablet Take 1 tablet (10 mg total) by mouth daily.  . sertraline (ZOLOFT) 50 MG tablet Take 1 tablet (50 mg total) by mouth daily.  Marland Kitchen terazosin (HYTRIN) 10 MG capsule Take 10 mg by mouth at bedtime.   . TURMERIC PO Take by mouth daily.     Allergies  Allergen Reactions  . Albuterol Sulfate Palpitations  . Azithromycin Other (See Comments)    Hepatotoxicity  . Doxycycline Hyclate Other (See Comments)    Taken with Azithromycin and had Heaptotoxicity    Social History    Socioeconomic History  . Marital status: Married    Spouse name: Not on file  . Number of children: 2  . Years of education: 16  . Highest education level: Bachelor's degree (e.g., BA, AB, BS)  Occupational History  . Occupation: retired  Tobacco Use  . Smoking status: Former Smoker    Packs/day: 1.50    Years: 15.00    Pack years: 22.50    Start date: 10/07/1978  . Smokeless tobacco: Never Used  Vaping Use  . Vaping Use: Never used  Substance and Sexual Activity  . Alcohol use: Yes    Alcohol/week: 7.0 - 14.0 standard drinks    Types: 7 - 14 Glasses of wine per week    Comment: 1-2 glasses of wine with dinner  . Drug use: No  . 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  Other Topics Concern  . Not on file  Social History Narrative  . Not on file   Social Determinants of Health   Financial Resource Strain: Not on file  Food Insecurity: Not on file  Transportation Needs: Not on file  Physical Activity: Not on file  Stress: Not on file  Social Connections: Not on file  Intimate Partner Violence: Not on file     Review of Systems: General: negative  for chills, fever, night sweats or weight changes.  Cardiovascular: negative for chest pain, dyspnea on exertion, edema, orthopnea, palpitations, paroxysmal nocturnal dyspnea or shortness of breath Dermatological: negative for rash Respiratory: negative for cough or wheezing Urologic: negative for hematuria Abdominal: negative for nausea, vomiting, diarrhea, bright red blood per rectum, melena, or hematemesis Neurologic: negative for visual changes, syncope, or dizziness All other systems reviewed and are otherwise negative except as noted above.    Blood pressure 118/68, pulse (!) 52, height 5\' 5"  (1.651 m), weight 167 lb 3.2 oz (75.8 kg).  General appearance: alert and no distress Neck: no adenopathy, no carotid bruit, no  JVD, supple, symmetrical, trachea midline and thyroid not enlarged, symmetric, no tenderness/mass/nodules Lungs: clear to auscultation bilaterally Heart: regular rate and rhythm, S1, S2 normal, no murmur, click, rub or gallop Extremities: extremities normal, atraumatic, no cyanosis or edema Pulses: 2+ and symmetric Skin: Skin color, texture, turgor normal. No rashes or lesions Neurologic: Alert and oriented X 3, normal strength and tone. Normal symmetric reflexes. Normal coordination and gait  EKG sinus bradycardia at 52 without ST or T wave changes.  I personally reviewed this EKG.  ASSESSMENT AND PLAN:   Hyperlipidemia History of hyperlipidemia on statin therapy with lipid profile performed 10/20/2020 revealing total cholesterol 121, LDL 36 and HDL of 67.  Hypertension History of essential hypertension blood pressure measured today 118/68.  He is on hydrochlorothiazide.  Irregular cardiac rhythm History of paroxysmal atrial flutter seen on event monitor performed 08/29/2020 maintaining sinus rhythm on amiodarone and Eliquis.      Lorretta Harp MD FACP,FACC,FAHA, [facility] 12/26/2020 11:51 AM

## 2020-12-26 NOTE — Patient Instructions (Signed)

## 2020-12-26 NOTE — Assessment & Plan Note (Signed)
History of paroxysmal atrial flutter seen on event monitor performed 08/29/2020 maintaining sinus rhythm on amiodarone and Eliquis.

## 2020-12-26 NOTE — Assessment & Plan Note (Signed)
History of hyperlipidemia on statin therapy with lipid profile performed 10/20/2020 revealing total cholesterol 121, LDL 36 and HDL of 67.

## 2021-01-01 ENCOUNTER — Encounter: Payer: Self-pay | Admitting: Family Medicine

## 2021-01-02 ENCOUNTER — Other Ambulatory Visit: Payer: Self-pay | Admitting: Family Medicine

## 2021-01-02 DIAGNOSIS — R54 Age-related physical debility: Secondary | ICD-10-CM

## 2021-01-04 ENCOUNTER — Telehealth: Payer: Self-pay | Admitting: Pharmacist

## 2021-01-04 NOTE — Progress Notes (Addendum)
    Chronic Care Management Pharmacy Assistant   Name: Walter Brown  MRN: [id number] DOB: 01/09/1941  Reason for Encounter: Adherence Review  I reviewed the patients chart for any medical/health changes and/or medication changes, on 12/26/20 with Lorretta Harp, MD INCREASED Amiodarone HCL to 200 mg daily.   Verified Adherence Gap Information. Per insurance data the patients most recent blood pressure was 118/73 on 06/06/20.  Follow-Up:Pharmacist Review  Charlann Lange, [place] Pharmacist Assistant [phone]

## 2021-01-07 ENCOUNTER — Encounter: Payer: Self-pay | Admitting: Family Medicine

## 2021-01-08 NOTE — Telephone Encounter (Signed)
We sent you a message on 01/02/21.  Do you still have this message?

## 2021-01-10 NOTE — Telephone Encounter (Signed)
Gave patient information and he stated that he  needed it sent to [facility].  [facility] does not have a physical therapist evaluator to maintain his driving privileges.  Can we use the same referral and fax it to the [facility] at [phone]?  This was from his previous mychart message from 01/01/21:  Walter Brown to [name], MD      6:29 PM I am still trying to maintain my driving privileges by being tested by a  Physical Therapist  Evaluator.. I need your referral to proceed.    Please respond to Fax # [phone].  Thanks. Walter Brown

## 2021-01-25 ENCOUNTER — Telehealth: Payer: PPO | Admitting: Family Medicine

## 2021-01-25 DIAGNOSIS — H353112 Nonexudative age-related macular degeneration, right eye, intermediate dry stage: Secondary | ICD-10-CM | POA: Diagnosis not present

## 2021-01-25 DIAGNOSIS — H43813 Vitreous degeneration, bilateral: Secondary | ICD-10-CM | POA: Diagnosis not present

## 2021-01-25 DIAGNOSIS — H35372 Puckering of macula, left eye: Secondary | ICD-10-CM | POA: Diagnosis not present

## 2021-01-25 DIAGNOSIS — H353221 Exudative age-related macular degeneration, left eye, with active choroidal neovascularization: Secondary | ICD-10-CM | POA: Diagnosis not present

## 2021-02-06 ENCOUNTER — Encounter: Payer: Self-pay | Admitting: Family Medicine

## 2021-02-06 ENCOUNTER — Telehealth (INDEPENDENT_AMBULATORY_CARE_PROVIDER_SITE_OTHER): Payer: PPO | Admitting: Family Medicine

## 2021-02-06 ENCOUNTER — Other Ambulatory Visit: Payer: Self-pay

## 2021-02-06 VITALS — BP 127/93 | HR 61

## 2021-02-06 DIAGNOSIS — R002 Palpitations: Secondary | ICD-10-CM

## 2021-02-06 DIAGNOSIS — G4700 Insomnia, unspecified: Secondary | ICD-10-CM

## 2021-02-06 DIAGNOSIS — E782 Mixed hyperlipidemia: Secondary | ICD-10-CM

## 2021-02-06 DIAGNOSIS — I499 Cardiac arrhythmia, unspecified: Secondary | ICD-10-CM

## 2021-02-06 DIAGNOSIS — D649 Anemia, unspecified: Secondary | ICD-10-CM | POA: Diagnosis not present

## 2021-02-06 DIAGNOSIS — I10 Essential (primary) hypertension: Secondary | ICD-10-CM | POA: Diagnosis not present

## 2021-02-06 DIAGNOSIS — M549 Dorsalgia, unspecified: Secondary | ICD-10-CM

## 2021-02-06 DIAGNOSIS — R739 Hyperglycemia, unspecified: Secondary | ICD-10-CM | POA: Diagnosis not present

## 2021-02-06 DIAGNOSIS — G20 Parkinson's disease: Secondary | ICD-10-CM

## 2021-02-06 HISTORY — DX: Insomnia, unspecified: G47.00

## 2021-02-06 NOTE — Assessment & Plan Note (Signed)
hgba1c acceptable, minimize simple carbs. Increase exercise as tolerated.  

## 2021-02-06 NOTE — Assessment & Plan Note (Signed)
reasonably controlled, no changes to meds. Encouraged heart healthy diet such as the DASH diet and exercise as tolerated.

## 2021-02-06 NOTE — Assessment & Plan Note (Signed)
He has recently started on Sinemet but hs not noted any changes thus far.

## 2021-02-06 NOTE — Assessment & Plan Note (Signed)
Encouraged heart healthy diet, increase exercise, avoid trans fats, consider a krill oil cap daily. Tolerating statins. 

## 2021-02-06 NOTE — Assessment & Plan Note (Signed)
Encouraged good sleep hygiene such as dark, quiet room. No blue/green glowing lights such as computer screens in bedroom. No alcohol or stimulants in evening. Cut down on caffeine as able. Regular exercise is helpful but not just prior to bed time. Uses Unisom at times.

## 2021-02-06 NOTE — Progress Notes (Signed)
MyChart Video Visit    Virtual Visit via Video Note   This visit type was conducted due to national recommendations for restrictions regarding the COVID-19 Pandemic (e.g. social distancing) in an effort to limit this patient's exposure and mitigate transmission in our community. This patient is at least at moderate risk for complications without adequate follow up. This format is felt to be most appropriate for this patient at this time. Physical exam was limited by quality of the video and audio technology used for the visit. S Chism, CMA  was able to get the patient set up on a video visit.  Patient location: home Patient and provider in visit Provider location: Office  I discussed the limitations of evaluation and management by telemedicine and the availability of in person appointments. The patient expressed understanding and agreed to proceed.  Visit Date: 02/06/2021  Today's healthcare provider: Penni Homans, MD     Subjective:    Patient ID: Walter Brown, male    DOB: [birth year], 80 y.o.   MRN: [id number]  Chief Complaint  Patient presents with  . Follow-up  . Hyperlipidemia  . Hypertension    HPI Patient is in today for follow up on chronic medical conditions including back pain, hypertension andmore. No recent febrile illness or hospitalizations. He is doing well at home. He tries to stay active by walking 4-5 days a week. He maintains a heart healthy diet. He notes some weakness in his thighs at times. He uses Unisom sometimes when he cannot sleep. Denies CP/palp/SOB/HA/congestion/fevers/GI or GU c/o. Taking meds as prescribed  Past Medical History:  Diagnosis Date  . Anemia    mild  . Arthritis 04/06/2017  . Asthma    childhood  . ASVD (arteriosclerotic vascular disease) 09/07/2018  . Basal cell carcinoma    skin- on nose- basal cell (20 yrs ago) forehead 1 year ago  . BPH (benign prostatic hyperplasia)   . Bradycardia 06/28/2018  . Cerumen impaction 08/10/2012   . Chicken pox as child  . Coronary artery calcification 11/23/2019  . [place] measles as a child  . Hyperlipidemia   . Hypertension   . Irregular cardiac rhythm 03/10/2018  . Macular degeneration   . Major depressive disorder 08/01/2012  . Mild neurocognitive disorder due to Parkinson's disease ([place]) 12/27/2019  . Obstructive sleep apnea 11/09/2018   Patient reports mild symptoms; was not prescribed a CPAP machine  . Otitis externa 08/10/2012  . PAF (paroxysmal atrial fibrillation)   . Parkinson's disease 05/19/2019  . Thrombocytopenia   . TIA (transient ischemic attack) 03/10/2018    Past Surgical History:  Procedure Laterality Date  . [place]     very young, b/l  . EXCISIONAL HEMORRHOIDECTOMY    . HYDROCELE EXCISION / REPAIR  2012   b/l  . SKIN CANCER EXCISION     nose and forehead, basal cell CA    Family History  Problem Relation Age of Onset  . Hypertension Mother   . Other Mother        MRSA  . Heart disease Mother        stents  . Heart disease Father   . Hypertension Father   . Prostate cancer Father 33  . Parkinson's disease Father   . Prostate cancer Maternal Grandfather   . ADD / ADHD Son        ADHD  . Other Son 24       part of 1 lung removed- due to infection  .  Stroke Maternal Grandmother   . Cancer Paternal Grandfather        EYE    Social History   Socioeconomic History  . Marital status: Married    Spouse name: Not on file  . Number of children: 2  . Years of education: 16  . Highest education level: Bachelor's degree (e.g., BA, AB, BS)  Occupational History  . Occupation: retired  Tobacco Use  . Smoking status: Former Smoker    Packs/day: 1.50    Years: 15.00    Pack years: 22.50    Start date: 10/07/1978  . Smokeless tobacco: Never Used  Vaping Use  . Vaping Use: Never used  Substance and Sexual Activity  . Alcohol use: Yes    Alcohol/week: 7.0 - 14.0 standard drinks    Types: 7 - 14 Glasses of wine per week     Comment: 1-2 glasses of wine with dinner  . Drug use: No  . 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  Other Topics Concern  . Not on file  Social History Narrative  . Not on file   Social Determinants of Health   Financial Resource Strain: Not on file  Food Insecurity: Not on file  Transportation Needs: Not on file  Physical Activity: Not on file  Stress: Not on file  Social Connections: Not on file  Intimate Partner Violence: Not on file    Outpatient Medications Prior to Visit  Medication Sig Dispense Refill  . amiodarone (PACERONE) 200 MG tablet Take 200 mg by mouth daily.    Marland Kitchen apixaban (ELIQUIS) 5 MG TABS tablet Take 1 tablet (5 mg total) by mouth 2 (two) times daily. 180 tablet 3  . carbidopa-levodopa (SINEMET) 25-100 MG tablet Take 1 tablet by mouth 3 (three) times daily. 7am/11am/4pm 270 tablet 1  . hydrochlorothiazide (HYDRODIURIL) 25 MG tablet Take 1 tablet (25 mg total) by mouth daily. 90 tablet 1  . Multiple Vitamins-Minerals (ICAPS AREDS 2 PO) Take 1 capsule by mouth in the morning and at bedtime.    . rosuvastatin (CRESTOR) 10 MG tablet Take 1 tablet (10 mg total) by mouth daily. 90 tablet 3  . sertraline (ZOLOFT) 50 MG tablet Take 1 tablet (50 mg total) by mouth daily. 90 tablet 1  . terazosin (HYTRIN) 10 MG capsule Take 10 mg by mouth at bedtime.     . TURMERIC PO Take by mouth daily.     No facility-administered medications prior to visit.    Allergies  Allergen Reactions  . Albuterol Sulfate Palpitations  . Azithromycin Other (See Comments)    Hepatotoxicity  . Doxycycline Hyclate Other (See Comments)    Taken with Azithromycin and had Heaptotoxicity    Review of Systems  Constitutional: Negative for fever and malaise/fatigue.  HENT: Negative for congestion.   Eyes: Negative for blurred vision.  Respiratory: Negative for shortness of breath.    Cardiovascular: Negative for chest pain, palpitations and leg swelling.  Gastrointestinal: Negative for abdominal pain, blood in stool and nausea.  Genitourinary: Negative for dysuria and frequency.  Musculoskeletal: Positive for back pain. Negative for falls.  Skin: Negative for rash.  Neurological: Positive for focal weakness. Negative for dizziness, loss of consciousness and headaches.  Endo/Heme/Allergies: Negative for environmental allergies.  Psychiatric/Behavioral: Negative for depression. The patient is not nervous/anxious.        Objective:    Physical Exam Constitutional:  Appearance: Normal appearance. He is not ill-appearing.  HENT:     Head: Normocephalic and atraumatic.     Right Ear: External ear normal.     Left Ear: External ear normal.     Nose: Nose normal.  Eyes:     General:        Right eye: No discharge.        Left eye: No discharge.  Pulmonary:     Effort: Pulmonary effort is normal.  Neurological:     Mental Status: He is alert and oriented to person, place, and time.  Psychiatric:        Behavior: Behavior normal.     BP (!) 127/93   Pulse 61   SpO2 97%  Wt Readings from Last 3 Encounters:  12/26/20 167 lb 3.2 oz (75.8 kg)  11/28/20 174 lb (78.9 kg)  10/31/20 173 lb (78.5 kg)    Diabetic Foot Exam - Simple   No data filed    Lab Results  Component Value Date   WBC 6.8 10/20/2020   HGB 14.0 10/20/2020   HCT 40.6 10/20/2020   PLT 128 (L) 10/20/2020   GLUCOSE 107 (H) 10/20/2020   CHOL 121 10/20/2020   TRIG 95 10/20/2020   HDL 67 10/20/2020   LDLCALC 36 10/20/2020   ALT 18 10/20/2020   AST 21 10/20/2020   NA 141 10/20/2020   K 4.1 10/20/2020   CL 105 10/20/2020   CREATININE 1.16 10/20/2020   BUN 17 10/20/2020   CO2 30 10/20/2020   TSH 1.45 10/20/2020   INR 1.0 12/15/2017   HGBA1C 5.0 10/20/2020    Lab Results  Component Value Date   TSH 1.45 10/20/2020   Lab Results  Component Value Date   WBC 6.8 10/20/2020    HGB 14.0 10/20/2020   HCT 40.6 10/20/2020   MCV 93.3 10/20/2020   PLT 128 (L) 10/20/2020   Lab Results  Component Value Date   NA 141 10/20/2020   K 4.1 10/20/2020   CO2 30 10/20/2020   GLUCOSE 107 (H) 10/20/2020   BUN 17 10/20/2020   CREATININE 1.16 10/20/2020   BILITOT 0.6 10/20/2020   ALKPHOS 64 04/05/2020   AST 21 10/20/2020   ALT 18 10/20/2020   PROT 6.0 (L) 10/20/2020   ALBUMIN 4.1 04/05/2020   CALCIUM 9.0 10/20/2020   ANIONGAP 12 08/27/2018   GFR 73.06 07/20/2019   Lab Results  Component Value Date   CHOL 121 10/20/2020   Lab Results  Component Value Date   HDL 67 10/20/2020   Lab Results  Component Value Date   LDLCALC 36 10/20/2020   Lab Results  Component Value Date   TRIG 95 10/20/2020   Lab Results  Component Value Date   CHOLHDL 1.8 10/20/2020   Lab Results  Component Value Date   HGBA1C 5.0 10/20/2020       Assessment & Plan:   Problem List Items Addressed This Visit    Anemia - Primary   Hyperlipidemia    Encouraged heart healthy diet, increase exercise, avoid trans fats, consider a krill oil cap daily. Tolerating statins      Relevant Orders   Lipid panel   Hypertension    reasonably controlled, no changes to meds. Encouraged heart healthy diet such as the DASH diet and exercise as tolerated.      Relevant Orders   CBC   Comprehensive metabolic panel   TSH   Irregular cardiac rhythm   Palpitations  Parkinson's disease ([place])    He has recently started on Sinemet but hs not noted any changes thus far.       Hyperglycemia    hgba1c acceptable, minimize simple carbs. Increase exercise as tolerated.       Relevant Orders   Hemoglobin A1c   Insomnia    Encouraged good sleep hygiene such as dark, quiet room. No blue/green glowing lights such as computer screens in bedroom. No alcohol or stimulants in evening. Cut down on caffeine as able. Regular exercise is helpful but not just prior to bed time. Uses Unisom at times.           I am having Garyson O. Wile maintain his terazosin, Multiple Vitamins-Minerals (ICAPS AREDS 2 PO), rosuvastatin, apixaban, hydrochlorothiazide, TURMERIC PO, amiodarone, sertraline, and carbidopa-levodopa.  No orders of the defined types were placed in this encounter.   I discussed the assessment and treatment plan with the patient. The patient was provided an opportunity to ask questions and all were answered. The patient agreed with the plan and demonstrated an understanding of the instructions.   The patient was advised to call back or seek an in-person evaluation if the symptoms worsen or if the condition fails to improve as anticipated.  I provided 26 minutes of face-to-face time during this encounter.   Penni Homans, MD [facility] [phone] (phone) [phone] (fax)  [place]

## 2021-02-06 NOTE — Assessment & Plan Note (Signed)
He is walking 4-5 x a week for about 35 minutes. He notes feeling weak in his thighs sometimes. He will report if it worsens

## 2021-02-08 DIAGNOSIS — H6123 Impacted cerumen, bilateral: Secondary | ICD-10-CM | POA: Diagnosis not present

## 2021-03-01 ENCOUNTER — Telehealth: Payer: PPO

## 2021-03-01 ENCOUNTER — Encounter: Payer: Self-pay | Admitting: Family Medicine

## 2021-03-02 NOTE — Telephone Encounter (Signed)
Rescheduled pharmacist  appointment and made one with Dr. Charlett Blake

## 2021-03-07 ENCOUNTER — Telehealth: Payer: PPO

## 2021-03-07 DIAGNOSIS — H4322 Crystalline deposits in vitreous body, left eye: Secondary | ICD-10-CM | POA: Diagnosis not present

## 2021-03-07 DIAGNOSIS — H2513 Age-related nuclear cataract, bilateral: Secondary | ICD-10-CM | POA: Diagnosis not present

## 2021-03-07 DIAGNOSIS — H524 Presbyopia: Secondary | ICD-10-CM | POA: Diagnosis not present

## 2021-03-12 ENCOUNTER — Other Ambulatory Visit: Payer: Self-pay | Admitting: Family Medicine

## 2021-03-12 ENCOUNTER — Ambulatory Visit (INDEPENDENT_AMBULATORY_CARE_PROVIDER_SITE_OTHER): Payer: PPO | Admitting: Pharmacist

## 2021-03-12 DIAGNOSIS — E782 Mixed hyperlipidemia: Secondary | ICD-10-CM

## 2021-03-12 DIAGNOSIS — I480 Paroxysmal atrial fibrillation: Secondary | ICD-10-CM | POA: Diagnosis not present

## 2021-03-12 DIAGNOSIS — F329 Major depressive disorder, single episode, unspecified: Secondary | ICD-10-CM | POA: Diagnosis not present

## 2021-03-12 DIAGNOSIS — G459 Transient cerebral ischemic attack, unspecified: Secondary | ICD-10-CM

## 2021-03-12 DIAGNOSIS — I10 Essential (primary) hypertension: Secondary | ICD-10-CM | POA: Diagnosis not present

## 2021-03-12 DIAGNOSIS — G20A1 Parkinson's disease without dyskinesia, without mention of fluctuations: Secondary | ICD-10-CM

## 2021-03-12 DIAGNOSIS — G20 Parkinson's disease: Secondary | ICD-10-CM

## 2021-03-12 MED ORDER — ROSUVASTATIN CALCIUM 5 MG PO TABS: 5.0000 mg | ORAL_TABLET | Freq: Every day | ORAL | 1 refills | Status: DC

## 2021-03-12 MED ORDER — SERTRALINE HCL 100 MG PO TABS: 100.0000 mg | ORAL_TABLET | Freq: Every day | ORAL | 1 refills | Status: DC

## 2021-03-12 NOTE — Patient Instructions (Addendum)
Walter Brown,  It was a pleasure speaking with you today. Please feel free to contact me if you have any questions or concerns. Below is information regarding you health goals.   Keep up the good work!  Walter Brown, PharmD Clinical Pharmacist [facility] [phone]  Visit Information  PATIENT GOALS: Goals Addressed            This Visit's Progress   . Chronic Care Management Pharmacy Care Plan   On track    CARE PLAN ENTRY (see longitudinal plan of care for additional care plan information)  Current Barriers:  . Chronic Disease Management support, education, and care coordination needs related to Hypertension, Hyperlipidemia/TIA, AFib, Depression, BPH, Parkinson's Disease   Hypertension BP Readings from Last 3 Encounters:  02/06/21 (!) 127/93  12/26/20 118/68  11/28/20 120/78   . Pharmacist Clinical Goal(s): o Over the next 90 days, patient will work with PharmD and providers to maintain BP goal <130/80 . Current regimen:  o HCTZ 25mg  daily in morning . Interventions: o Discussed blood pressure goal o Requested pt record BG readings  . Patient self care activities - Over the next 90 days, patient will: o Check BP 1 to 2 times per week, document, and provide at future appointments o Ensure daily salt intake < 2300 mg/day  Hyperlipidemia Lab Results  Component Value Date/Time   LDLCALC 36 10/20/2020 02:26 PM   . Pharmacist Clinical Goal(s): o Over the next 90 days, patient will work with PharmD and providers to maintain LDL goal < 70 . Current regimen:  . Rosuvastatin 10mg  0.5 tablet = 5mg  daily  . CoQ10 100mg  daily . Interventions: . Reviewed last lipid panel and discuss goals . Discussed benefits of statin for ASCVD risk reduction; . Recommended to continue current medication and exercise regimen    Parkinson's Disease (goal: decrease symptoms of PD - weakness, slightly tremor): Marland Kitchen Pharmacist Clinical Goal(s) o Over the  next 90 days, patient will work with PharmD and providers to reduce symptoms of Parkinson's Disease . Current regimen:   Carbidopa / Levodopa 25/100mg  3 times a day (7am, 11am and 4pm) . Patient self care activities - Over the next 90 days, patient will:  o Continue current medication and f/u with Dr Tat.     Atrial fibrillation . Pharmacist Clinical Goal(s) o Over the next 90 days, patient will work with PharmD and providers to reduce risk of stroke due to Afib . Current regimen:   Amiodarone 200mg  daily  Eliquis 5mg  twice daily . Interventions: o Discussed how Eliquis prevents strokes in comparison to statins . Patient self care activities - Over the next 90 days, patient will: o Maintain Afib medication regimen o Contact clinical pharmacist if cost of Eliquis increases  Depression . Pharmacist Clinical Goal(s) o Over the next 90 days, patient will work with PharmD and providers to reduce symptoms associated with depression . Current regimen:  o Sertraline 50mg  daily . Interventions: o Discussed option of speaking to counselor regarding feelings about Parkinson's disease o Coordinating with primary care provider to increase sertraline to 100mg  daily . Patient self care activities - Over the next 90 days, patient will: o Consider accepting referral to speak to someone regarding his feelings about his Parkinson's diagnosis  o Increase sertraline dose to 100mg  daily   Medication management . Pharmacist Clinical Goal(s): o Over the next 90 days, patient will work with PharmD and providers to achieve optimal medication adherence . Current pharmacy: [facility]  switched to [facility] . Interventions o Comprehensive medication review performed. o Continue current medication management strategy . Patient self care activities - Over the next 90 days, patient will: o Focus on medication adherence by filling and taking medications appropriately  o Take medications as prescribed o Report any  questions or concerns to PharmD and/or provider(s)  Please see past updates related to this goal by clicking on the "Past Updates" button in the selected goal         Patient verbalizes understanding of instructions provided today and agrees to view in [place].   Telephone follow up appointment with care management team member scheduled for: 4 to 6 weeks
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
## 2021-03-13 ENCOUNTER — Other Ambulatory Visit: Payer: Self-pay

## 2021-03-13 ENCOUNTER — Ambulatory Visit (INDEPENDENT_AMBULATORY_CARE_PROVIDER_SITE_OTHER): Payer: PPO

## 2021-03-13 VITALS — BP 130/86 | HR 52 | Temp 97.9°F | Resp 16 | Ht 65.0 in | Wt 164.2 lb

## 2021-03-13 DIAGNOSIS — Z0000 Encounter for general adult medical examination without abnormal findings: Secondary | ICD-10-CM | POA: Diagnosis not present

## 2021-03-13 NOTE — Progress Notes (Signed)
Subjective:   Walter Brown is a 80 y.o. male who presents for Medicare Annual/Subsequent preventive examination.  Review of Systems     Cardiac Risk Factors include: advanced age (>25men, >71 women);male gender;hypertension;dyslipidemia     Objective:    Today's Vitals   03/13/21 1449  BP: 130/86  Pulse: (!) 52  Resp: 16  Temp: 97.9 F (36.6 C)  TempSrc: Temporal  SpO2: 98%  Weight: 164 lb 3.2 oz (74.5 kg)  Height: 5\' 5"  (1.651 m)   Body mass index is 27.32 kg/m.  Advanced Directives 03/13/2021 10/31/2020 02/29/2020 10/19/2019 10/11/2019 03/31/2019 10/05/2018  Does Patient Have a Medical Advance Directive? Yes Yes Yes Yes Yes Yes Yes  Type of Paramedic of [place];Living will [place];Living will [place];Living will [place];Living will [place];Living will Living will;Healthcare Power of Attorney Living will;Healthcare Power of Attorney  Does patient want to make changes to medical advance directive? - - - - No - Patient declined No - Patient declined -  Copy of [place] in Chart? No - copy requested - - - No - copy requested No - copy requested No - copy requested  Would patient like information on creating a medical advance directive? - - - - - - -    Current Medications (verified) Outpatient Encounter Medications as of 03/13/2021  Medication Sig  . amiodarone (PACERONE) 200 MG tablet Take 200 mg by mouth daily.  Marland Kitchen apixaban (ELIQUIS) 5 MG TABS tablet Take 1 tablet (5 mg total) by mouth 2 (two) times daily.  . carbidopa-levodopa (SINEMET) 25-100 MG tablet Take 1 tablet by mouth 3 (three) times daily. 7am/11am/4pm  . Coenzyme Q10 100 MG TABS Take 1 tablet by mouth daily.  . hydrochlorothiazide (HYDRODIURIL) 25 MG tablet Take 1 tablet (25 mg total) by mouth daily.  . Melatonin 10 MG TABS Take 10 mg by mouth daily.  . Multiple Vitamins-Minerals (ICAPS  AREDS 2 PO) Take 1 capsule by mouth in the morning and at bedtime.  . rosuvastatin (CRESTOR) 5 MG tablet Take 1 tablet (5 mg total) by mouth daily.  . sertraline (ZOLOFT) 100 MG tablet Take 1 tablet (100 mg total) by mouth daily.  Marland Kitchen terazosin (HYTRIN) 10 MG capsule Take 10 mg by mouth at bedtime.   . TURMERIC PO Take by mouth daily. (Patient not taking: No sig reported)   No facility-administered encounter medications on file as of 03/13/2021.    Allergies (verified) Albuterol sulfate, Azithromycin, and Doxycycline hyclate   History: Past Medical History:  Diagnosis Date  . Anemia    mild  . Arthritis 04/06/2017  . Asthma    childhood  . ASVD (arteriosclerotic vascular disease) 09/07/2018  . Basal cell carcinoma    skin- on nose- basal cell (20 yrs ago) forehead 1 year ago  . BPH (benign prostatic hyperplasia)   . Bradycardia 06/28/2018  . Cerumen impaction 08/10/2012  . Chicken pox as child  . Coronary artery calcification 11/23/2019  . [place] measles as a child  . Hyperlipidemia   . Hypertension   . Irregular cardiac rhythm 03/10/2018  . Macular degeneration   . Major depressive disorder 08/01/2012  . Mild neurocognitive disorder due to Parkinson's disease ([place]) 12/27/2019  . Obstructive sleep apnea 11/09/2018   Patient reports mild symptoms; was not prescribed a CPAP machine  . Otitis externa 08/10/2012  . PAF (paroxysmal atrial fibrillation)   . Parkinson's disease 05/19/2019  .  Thrombocytopenia   . TIA (transient ischemic attack) 03/10/2018   Past Surgical History:  Procedure Laterality Date  . [place]     very young, b/l  . EXCISIONAL HEMORRHOIDECTOMY    . HYDROCELE EXCISION / REPAIR  2012   b/l  . SKIN CANCER EXCISION     nose and forehead, basal cell CA   Family History  Problem Relation Age of Onset  . Hypertension Mother   . Other Mother        MRSA  . Heart disease Mother        stents  . Heart disease Father   . Hypertension Father   .  Prostate cancer Father 12  . Parkinson's disease Father   . Prostate cancer Maternal Grandfather   . ADD / ADHD Son        ADHD  . Other Son 24       part of 1 lung removed- due to infection  . Stroke Maternal Grandmother   . Cancer Paternal Grandfather        EYE   Social History   Socioeconomic History  . Marital status: Married    Spouse name: Not on file  . Number of children: 2  . Years of education: 16  . Highest education level: Bachelor's degree (e.g., BA, AB, BS)  Occupational History  . Occupation: retired  Tobacco Use  . Smoking status: Former Smoker    Packs/day: 1.50    Years: 15.00    Pack years: 22.50    Start date: 10/07/1978  . Smokeless tobacco: Never Used  Vaping Use  . Vaping Use: Never used  Substance and Sexual Activity  . Alcohol use: Yes    Alcohol/week: 7.0 - 14.0 standard drinks    Types: 7 - 14 Glasses of wine per week    Comment: 1-2 glasses of wine with dinner  . Drug use: No  . 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  Other Topics Concern  . Not on file  Social History Narrative  . Not on file   Social Determinants of Health   Financial Resource Strain: Low Risk   . Difficulty of Paying Living Expenses: Not hard at all  Food Insecurity: No Food Insecurity  . Worried About Charity fundraiser in the Last Year: Never true  . Ran Out of Food in the Last Year: Never true  Transportation Needs: No Transportation Needs  . Lack of Transportation (Medical): No  . Lack of Transportation (Non-Medical): No  Physical Activity: Sufficiently Active  . Days of Exercise per Week: 5 days  . Minutes of Exercise per Session: 30 min  Stress: No Stress Concern Present  . Feeling of Stress : Not at all  Social Connections: Moderately Integrated  . Frequency of Communication with Friends and Family: More than three times a week  . Frequency of Social  Gatherings with Friends and Family: Once a week  . Attends Religious Services: 1 to 4 times per year  . Active Member of Clubs or Organizations: No  . Attends Archivist Meetings: Never  . Marital Status: Married    Tobacco Counseling Counseling given: Not Answered   Clinical Intake:  Pre-visit preparation completed: Yes  Pain : No/denies pain     Nutritional Status: BMI 25 -29 Overweight Nutritional Risks: None Diabetes: No  How often do you need to have someone help you  when you read instructions, pamphlets, or other written materials from your doctor or pharmacy?: 1 - Never  Diabetic?No  Interpreter Needed?: No  Information entered by :: Caroleen Hamman LPN   Activities of Daily Living In your present state of health, do you have any difficulty performing the following activities: 03/13/2021 10/26/2020  Hearing? N Y  Comment - wearing hear aids  Vision? N N  Difficulty concentrating or making decisions? N Y  Comment - slightly have memory lapes  Walking or climbing stairs? N Y  Comment - balance not good but "manages ok"  Dressing or bathing? N N  Doing errands, shopping? N N  Preparing Food and eating ? N -  Using the Toilet? N -  In the past six months, have you accidently leaked urine? Y -  Comment occasionally -  Do you have problems with loss of bowel control? N -  Managing your Medications? N -  Managing your Finances? N -  Housekeeping or managing your Housekeeping? N -  Some recent data might be hidden    Patient Care Team: [name], MD as PCP - General (Family Medicine) [place], Jennefer Bravo, MD as Referring Physician (Dermatology) Luberta Mutter, MD as Consulting Physician (Ophthalmology) Ardis Hughs, MD as Consulting Physician (Urology) Tat, Eustace Quail, DO as Consulting Physician (Neurology)  Indicate any recent Medical Services you may have received from other than [facility] providers in the past year (date may be  approximate).     Assessment:   This is a routine wellness examination for [place].  Hearing/Vision screen  Hearing Screening   125Hz  250Hz  500Hz  1000Hz  2000Hz  3000Hz  4000Hz  6000Hz  8000Hz   Right ear:           Left ear:           Comments: Bilateral hearing aids  Vision Screening Comments: Reading glasses Last eye exam-02/2021-Dr. McCuwen  Dietary issues and exercise activities discussed: Current Exercise Habits: Home exercise routine, Type of exercise: walking, Time (Minutes): 30, Frequency (Times/Week): 5, Weekly Exercise (Minutes/Week): 150, Intensity: Mild, Exercise limited by: neurologic condition(s) (Parkinson's)  Goals Addressed   None    Depression Screen PHQ 2/9 Scores 03/13/2021 03/12/2021 02/06/2021 10/26/2020 08/28/2020 02/29/2020 10/11/2019  PHQ - 2 Score 1 3 3 4 4 1 1   PHQ- 9 Score - 11 7 10 11  - -    Fall Risk Fall Risk  03/13/2021 03/12/2021 10/31/2020 10/26/2020 02/29/2020  Falls in the past year? 1 0 0 0 0  Number falls in past yr: 0 0 0 - 0  Injury with Fall? 0 0 0 - 0  Risk for fall due to : No Fall Risks Impaired vision;Impaired balance/gait - - -  Follow up - - - - -    FALL RISK PREVENTION PERTAINING TO THE HOME:  Any stairs in or around the home? No  Home free of loose throw rugs in walkways, pet beds, electrical cords, etc? Yes  Adequate lighting in your home to reduce risk of falls? Yes   ASSISTIVE DEVICES UTILIZED TO PREVENT FALLS:  Life alert? No  Use of a cane, walker or w/c? No  Grab bars in the bathroom? Yes  Shower chair or bench in shower? No  Elevated toilet seat or a handicapped toilet? No   TIMED UP AND GO:  Was the test performed? Yes .  Length of time to ambulate 10 feet: 11 sec.   Gait slow and steady without use of assistive device  Cognitive Function: MMSE -  Mini Mental State Exam 03/05/2016  Orientation to time 5  Orientation to Place 5  Registration 3  Attention/ Calculation 5  Recall 3  Language- name 2 objects 2  Language-  repeat 1  Language- follow 3 step command 3  Language- read & follow direction 1  Write a sentence 1  Copy design 1  Total score 30     6CIT Screen 10/11/2019  What Year? 0 points  What month? 0 points  What time? 0 points  Count back from 20 0 points  Months in reverse 0 points  Repeat phrase 2 points  Total Score 2    Immunizations Immunization History  Administered Date(s) Administered  . Influenza Split 07/02/2012  . Influenza Whole 06/07/2013  . Influenza, High Dose Seasonal PF 07/08/2017, 05/19/2019, 06/22/2020  . Influenza,inj,Quad PF,6+ Mos 06/21/2014  . Influenza-Unspecified 06/07/2016, 07/08/2017, 05/26/2018  . PFIZER Comirnaty(Gray Top)Covid-19 Tri-Sucrose Vaccine 01/09/2021  . PFIZER(Purple Top)SARS-COV-2 Vaccination 10/20/2019, 11/10/2019, 06/29/2020  . Pneumococcal Conjugate-13 11/04/2013  . Pneumococcal Polysaccharide-23 10/08/2007, 08/04/2020  . Tdap 11/04/2013  . Zoster Recombinat (Shingrix) 05/26/2018, 11/05/2018  . Zoster, Live 10/07/1998    TDAP status: Up to date  Flu Vaccine status: Up to date  Pneumococcal vaccine status: Up to date  Covid-19 vaccine status: Completed vaccines  Qualifies for Shingles Vaccine? No   Zostavax completed Yes   Shingrix Completed?: Yes  Screening Tests Health Maintenance  Topic Date Due  . Pneumococcal Vaccine 65-4 Years old (1 of 4 - PCV13) Never done  . INFLUENZA VACCINE  05/07/2021  . TETANUS/TDAP  11/05/2023  . COVID-19 Vaccine  Completed  . Hepatitis C Screening  Completed  . PNA vac Low Risk Adult  Completed  . Zoster Vaccines- Shingrix  Completed  . HPV VACCINES  Aged Out    Health Maintenance  Health Maintenance Due  Topic Date Due  . Pneumococcal Vaccine 48-71 Years old (1 of 4 - PCV13) Never done    Colorectal cancer screening: No longer required.   Lung Cancer Screening: (Low Dose CT Chest recommended if Age 67-80 years, 30 pack-year currently smoking OR have quit w/in 15years.) does not  qualify.     Additional Screening:  Hepatitis C Screening: does not qualify  Vision Screening: Recommended annual ophthalmology exams for early detection of glaucoma and other disorders of the eye. Is the patient up to date with their annual eye exam?  Yes  Who is the provider or what is the name of the office in which the patient attends annual eye exams? DR. Brayton El .   Dental Screening: Recommended annual dental exams for proper oral hygiene  Community Resource Referral / Chronic Care Management: CRR required this visit?  No   CCM required this visit?  No      Plan:     I have personally reviewed and noted the following in the patient's chart:   . Medical and social history . Use of alcohol, tobacco or illicit drugs  . Current medications and supplements including opioid prescriptions. Patient is not currently taking opioid prescriptions. . Functional ability and status . Nutritional status . Physical activity . Advanced directives . List of other physicians . Hospitalizations, scouuurgeries, and ER visits in previous 12 months . Vitals . Screenings to include cognitive, depression, and falls . Referrals and appointments  In addition, I have reviewed and discussed with patient certain preventive protocols, quality metrics, and best practice recommendations. A written personalized care plan for preventive services as well as general preventive health  recommendations were provided to patient.   Patient to access avs on mychart.  Marta Antu, LPN   02/12/9233  Nurse Health Advisor  Nurse Notes: [name] states he had a couple of spells 2-3 weeks ago where he lost his balance & his legs felt weak. She states he said he felt like he was in A-fib when he had the spells but once he sat down he felt fine. He has had no further episodes since then Patient advised to make an appt to be seen but he declined. He states his legs just get weak sometimes. He does agree to  call his cardiologist & let him know about the spells. He states he feels fine today.

## 2021-03-13 NOTE — Patient Instructions (Signed)
Walter Brown , Thank you for taking time to come for your Medicare Wellness Visit. I appreciate your ongoing commitment to your health goals. Please review the following plan we discussed and let me know if I can assist you in the future.   Screening recommendations/referrals: Colonoscopy: No longer required Recommended yearly ophthalmology/optometry visit for glaucoma screening and checkup Recommended yearly dental visit for hygiene and checkup  Vaccinations: Influenza vaccine: Up to date Pneumococcal vaccine: Up to date Tdap vaccine: Up to date-Due-11/05/2023 Shingles vaccine: Completed vaccines Covid-19: Up to date  Advanced directives: Please bring a copy for your chart  Conditions/risks identified: See problem list  Next appointment: Follow up in one year for your annual wellness visit.   Preventive Care 40 Years and Older, Male Preventive care refers to lifestyle choices and visits with your health care provider that can promote health and wellness. What does preventive care include?  A yearly physical exam. This is also called an annual well check.  Dental exams once or twice a year.  Routine eye exams. Ask your health care provider how often you should have your eyes checked.  Personal lifestyle choices, including:  Daily care of your teeth and gums.  Regular physical activity.  Eating a healthy diet.  Avoiding tobacco and drug use.  Limiting alcohol use.  Practicing safe sex.  Taking low doses of aspirin every day.  Taking vitamin and mineral supplements as recommended by your health care provider. What happens during an annual well check? The services and screenings done by your health care provider during your annual well check will depend on your age, overall health, lifestyle risk factors, and family history of disease. Counseling  Your health care provider may ask you questions about your:  Alcohol use.  Tobacco use.  Drug use.  Emotional  well-being.  Home and relationship well-being.  Sexual activity.  Eating habits.  History of falls.  Memory and ability to understand (cognition).  Work and work Statistician. Screening  You may have the following tests or measurements:  Height, weight, and BMI.  Blood pressure.  Lipid and cholesterol levels. These may be checked every 5 years, or more frequently if you are over 79 years old.  Skin check.  Lung cancer screening. You may have this screening every year starting at age 60 if you have a 30-pack-year history of smoking and currently smoke or have quit within the past 15 years.  Fecal occult blood test (FOBT) of the stool. You may have this test every year starting at age 67.  Flexible sigmoidoscopy or colonoscopy. You may have a sigmoidoscopy every 5 years or a colonoscopy every 10 years starting at age 7.  Prostate cancer screening. Recommendations will vary depending on your family history and other risks.  Hepatitis C blood test.  Hepatitis B blood test.  Sexually transmitted disease (STD) testing.  Diabetes screening. This is done by checking your blood sugar (glucose) after you have not eaten for a while (fasting). You may have this done every 1-3 years.  Abdominal aortic aneurysm (AAA) screening. You may need this if you are a current or former smoker.  Osteoporosis. You may be screened starting at age 44 if you are at high risk. Talk with your health care provider about your test results, treatment options, and if necessary, the need for more tests. Vaccines  Your health care provider may recommend certain vaccines, such as:  Influenza vaccine. This is recommended every year.  Tetanus, diphtheria, and acellular pertussis (Tdap,  Td) vaccine. You may need a Td booster every 10 years.  Zoster vaccine. You may need this after age 28.  Pneumococcal 13-valent conjugate (PCV13) vaccine. One dose is recommended after age 53.  Pneumococcal  polysaccharide (PPSV23) vaccine. One dose is recommended after age 78. Talk to your health care provider about which screenings and vaccines you need and how often you need them. This information is not intended to replace advice given to you by your health care provider. Make sure you discuss any questions you have with your health care provider. Document Released: 10/20/2015 Document Revised: 06/12/2016 Document Reviewed: 07/25/2015 Elsevier Interactive Patient Education  2017 [place] Prevention in the Home Falls can cause injuries. They can happen to people of all ages. There are many things you can do to make your home safe and to help prevent falls. What can I do on the outside of my home?  Regularly fix the edges of walkways and driveways and fix any cracks.  Remove anything that might make you trip as you walk through a door, such as a raised step or threshold.  Trim any bushes or trees on the path to your home.  Use bright outdoor lighting.  Clear any walking paths of anything that might make someone trip, such as rocks or tools.  Regularly check to see if handrails are loose or broken. Make sure that both sides of any steps have handrails.  Any raised decks and porches should have guardrails on the edges.  Have any leaves, snow, or ice cleared regularly.  Use sand or salt on walking paths during winter.  Clean up any spills in your garage right away. This includes oil or grease spills. What can I do in the bathroom?  Use night lights.  Install grab bars by the toilet and in the tub and shower. Do not use towel bars as grab bars.  Use non-skid mats or decals in the tub or shower.  If you need to sit down in the shower, use a plastic, non-slip stool.  Keep the floor dry. Clean up any water that spills on the floor as soon as it happens.  Remove soap buildup in the tub or shower regularly.  Attach bath mats securely with double-sided non-slip rug  tape.  Do not have throw rugs and other things on the floor that can make you trip. What can I do in the bedroom?  Use night lights.  Make sure that you have a light by your bed that is easy to reach.  Do not use any sheets or blankets that are too big for your bed. They should not hang down onto the floor.  Have a firm chair that has side arms. You can use this for support while you get dressed.  Do not have throw rugs and other things on the floor that can make you trip. What can I do in the kitchen?  Clean up any spills right away.  Avoid walking on wet floors.  Keep items that you use a lot in easy-to-reach places.  If you need to reach something above you, use a strong step stool that has a grab bar.  Keep electrical cords out of the way.  Do not use floor polish or wax that makes floors slippery. If you must use wax, use non-skid floor wax.  Do not have throw rugs and other things on the floor that can make you trip. What can I do with my stairs?  Do not  leave any items on the stairs.  Make sure that there are handrails on both sides of the stairs and use them. Fix handrails that are broken or loose. Make sure that handrails are as long as the stairways.  Check any carpeting to make sure that it is firmly attached to the stairs. Fix any carpet that is loose or worn.  Avoid having throw rugs at the top or bottom of the stairs. If you do have throw rugs, attach them to the floor with carpet tape.  Make sure that you have a light switch at the top of the stairs and the bottom of the stairs. If you do not have them, ask someone to add them for you. What else can I do to help prevent falls?  Wear shoes that:  Do not have high heels.  Have rubber bottoms.  Are comfortable and fit you well.  Are closed at the toe. Do not wear sandals.  If you use a stepladder:  Make sure that it is fully opened. Do not climb a closed stepladder.  Make sure that both sides of the  stepladder are locked into place.  Ask someone to hold it for you, if possible.  Clearly mark and make sure that you can see:  Any grab bars or handrails.  First and last steps.  Where the edge of each step is.  Use tools that help you move around (mobility aids) if they are needed. These include:  Canes.  Walkers.  Scooters.  Crutches.  Turn on the lights when you go into a dark area. Replace any light bulbs as soon as they burn out.  Set up your furniture so you have a clear path. Avoid moving your furniture around.  If any of your floors are uneven, fix them.  If there are any pets around you, be aware of where they are.  Review your medicines with your doctor. Some medicines can make you feel dizzy. This can increase your chance of falling. Ask your doctor what other things that you can do to help prevent falls. This information is not intended to replace advice given to you by your health care provider. Make sure you discuss any questions you have with your health care provider. Document Released: 07/20/2009 Document Revised: 02/29/2016 Document Reviewed: 10/28/2014 Elsevier Interactive Patient Education  2017 [employer].

## 2021-03-28 DIAGNOSIS — H903 Sensorineural hearing loss, bilateral: Secondary | ICD-10-CM | POA: Diagnosis not present

## 2021-04-05 DIAGNOSIS — H353221 Exudative age-related macular degeneration, left eye, with active choroidal neovascularization: Secondary | ICD-10-CM | POA: Diagnosis not present

## 2021-04-05 DIAGNOSIS — H35372 Puckering of macula, left eye: Secondary | ICD-10-CM | POA: Diagnosis not present

## 2021-04-05 DIAGNOSIS — H43813 Vitreous degeneration, bilateral: Secondary | ICD-10-CM | POA: Diagnosis not present

## 2021-04-05 DIAGNOSIS — H353112 Nonexudative age-related macular degeneration, right eye, intermediate dry stage: Secondary | ICD-10-CM | POA: Diagnosis not present

## 2021-04-12 ENCOUNTER — Telehealth: Payer: Self-pay | Admitting: Pharmacist

## 2021-04-12 NOTE — Chronic Care Management (AMB) (Signed)
    Chronic Care Management Pharmacy Assistant   Name: Walter Brown  MRN: [id number] DOB: [birth year]   Reason for Encounter: Disease State General  Recent office visits:  None noted  Recent consult visits:  None noted  Hospital visits:  None in previous 6 months  Medications: Outpatient Encounter Medications as of 04/12/2021  Medication Sig   amiodarone (PACERONE) 200 MG tablet Take 200 mg by mouth daily.   apixaban (ELIQUIS) 5 MG TABS tablet Take 1 tablet (5 mg total) by mouth 2 (two) times daily.   carbidopa-levodopa (SINEMET) 25-100 MG tablet Take 1 tablet by mouth 3 (three) times daily. 7am/11am/4pm   Coenzyme Q10 100 MG TABS Take 1 tablet by mouth daily.   hydrochlorothiazide (HYDRODIURIL) 25 MG tablet Take 1 tablet (25 mg total) by mouth daily.   Melatonin 10 MG TABS Take 10 mg by mouth daily.   Multiple Vitamins-Minerals (ICAPS AREDS 2 PO) Take 1 capsule by mouth in the morning and at bedtime.   rosuvastatin (CRESTOR) 5 MG tablet Take 1 tablet (5 mg total) by mouth daily.   sertraline (ZOLOFT) 100 MG tablet Take 1 tablet (100 mg total) by mouth daily.   terazosin (HYTRIN) 10 MG capsule Take 10 mg by mouth at bedtime.    TURMERIC PO Take by mouth daily. (Patient not taking: No sig reported)   No facility-administered encounter medications on file as of 04/12/2021.    Have you had any problems recently with your health? Patient states he's not having any new problems with his health.  Have you had any problems with your pharmacy? Patient states he's not having problems with his pharmacy.  What issues or side effects are you having with your medications? Patient states he's not having any side effects that he's aware of.  What would you like me to pass along to [employer] ,CPP for them to help you with?  Patient states his Depression has improved with the increased Sertraline.  What can we do to take care of you better?  Patient states there's nothing at this  time.   Star Rating Drugs: Rosuvastatin 5 Mg last filled 03/13/21 90 DS   [facility] Clinical Pharmacist Assistant [phone]

## 2021-04-26 NOTE — Progress Notes (Deleted)
Assessment/Plan:   1.  Parkinsons Disease   -DaT scan abnormal in the past  -discussed increasing exercising  -We discussed that it used to be thought that levodopa would increase risk of melanoma but now it is believed that Parkinsons itself likely increases risk of melanoma. he is to get regular skin checks.  He does see dermatology 2.  MCI  -Neurocognitive testing with Dr. Melvyn Novas in March, 2021 with evidence of MCI only.  3.  Depression  -Primary care prescribing sertraline, 100 mg daily.  Subjective:   Walter Brown was seen today in follow up for Parkinsons disease.  My previous records were reviewed prior to todays visit as well as outside records available to me.  Patient was started on levodopa in February, after he sent me a patient message.  Pt denies falls.   Pt denies lightheadedness, near syncope.  No hallucinations.  Just prior to our last visit, primary care started him on low-dose sertraline.  This has been increased by primary care since our last visit 100 mg daily.  Reports today that ***  Current prescribed movement disorder medications: Carbidopa/levodopa 25/100, 1 tablet 3 times per day (started since our last visit.)   ALLERGIES:   Allergies  Allergen Reactions   Albuterol Sulfate Palpitations   Azithromycin Other (See Comments)    Hepatotoxicity   Doxycycline Hyclate Other (See Comments)    Taken with Azithromycin and had Heaptotoxicity    CURRENT MEDICATIONS:  Outpatient Encounter Medications as of 04/30/2021  Medication Sig   amiodarone (PACERONE) 200 MG tablet Take 200 mg by mouth daily.   apixaban (ELIQUIS) 5 MG TABS tablet Take 1 tablet (5 mg total) by mouth 2 (two) times daily.   carbidopa-levodopa (SINEMET) 25-100 MG tablet Take 1 tablet by mouth 3 (three) times daily. 7am/11am/4pm   Coenzyme Q10 100 MG TABS Take 1 tablet by mouth daily.   hydrochlorothiazide (HYDRODIURIL) 25 MG tablet Take 1 tablet (25 mg total) by mouth daily.   Melatonin 10  MG TABS Take 10 mg by mouth daily.   Multiple Vitamins-Minerals (ICAPS AREDS 2 PO) Take 1 capsule by mouth in the morning and at bedtime.   rosuvastatin (CRESTOR) 5 MG tablet Take 1 tablet (5 mg total) by mouth daily.   sertraline (ZOLOFT) 100 MG tablet Take 1 tablet (100 mg total) by mouth daily.   terazosin (HYTRIN) 10 MG capsule Take 10 mg by mouth at bedtime.    TURMERIC PO Take by mouth daily. (Patient not taking: No sig reported)   No facility-administered encounter medications on file as of 04/30/2021.    Objective:   PHYSICAL EXAMINATION:    VITALS:   There were no vitals filed for this visit.   GEN:  The patient appears stated age and is in NAD. HEENT:  Normocephalic, atraumatic.  The mucous membranes are moist. The superficial temporal arteries are without ropiness or tenderness. CV:  RRR Lungs:  CTAB Neck/HEME:  There are no carotid bruits bilaterally.  Neurological examination:  Orientation: The patient is alert and oriented x3. Cranial nerves: There is good facial symmetry with facial hypomimia. The speech is fluent and clear. Soft palate rises symmetrically and there is no tongue deviation. Hearing is intact to conversational tone. Sensation: Sensation is intact to light touch throughout Motor: Strength is at least antigravity x4.  Movement examination: Tone: There is mild increased tone in the rue Abnormal movements: none Coordination:  There is mild decremation with RAM's, with any form of RAMS,  including alternating supination and pronation of the forearm, hand opening and closing, finger taps, heel taps and toe taps on the right Gait and Station: The patient has no difficulty arising out of a deep-seated chair without the use of the hands. The patient's stride length is good but he slightly drags the R leg and decreased arm swing on the R.    I have reviewed and interpreted the following labs independently    Chemistry      Component Value Date/Time   NA 141  10/20/2020 1426   NA 142 04/05/2020 0849   K 4.1 10/20/2020 1426   CL 105 10/20/2020 1426   CO2 30 10/20/2020 1426   BUN 17 10/20/2020 1426   BUN 14 04/05/2020 0849   CREATININE 1.16 10/20/2020 1426      Component Value Date/Time   CALCIUM 9.0 10/20/2020 1426   ALKPHOS 64 04/05/2020 0849   AST 21 10/20/2020 1426   ALT 18 10/20/2020 1426   BILITOT 0.6 10/20/2020 1426   BILITOT 0.9 04/05/2020 0849       Lab Results  Component Value Date   WBC 6.8 10/20/2020   HGB 14.0 10/20/2020   HCT 40.6 10/20/2020   MCV 93.3 10/20/2020   PLT 128 (L) 10/20/2020    Lab Results  Component Value Date   TSH 1.45 10/20/2020     Total time spent on today's visit was *** minutes, including both face-to-face time and nonface-to-face time.  Time included that spent on review of records (prior notes available to me/labs/imaging if pertinent), discussing treatment and goals, answering patient's questions and coordinating care.  Cc:  [name], MD

## 2021-04-29 ENCOUNTER — Other Ambulatory Visit: Payer: Self-pay | Admitting: Neurology

## 2021-04-30 ENCOUNTER — Ambulatory Visit: Payer: PPO | Admitting: Neurology

## 2021-05-01 ENCOUNTER — Ambulatory Visit: Payer: PPO | Admitting: Neurology

## 2021-05-01 NOTE — Telephone Encounter (Signed)
appt July 29

## 2021-05-03 NOTE — Progress Notes (Signed)
Assessment/Plan:   1.  Parkinsons Disease   -DaT scan abnormal in the past  -discussed increasing exercising  -We discussed that it used to be thought that levodopa would increase risk of melanoma but now it is believed that Parkinsons itself likely increases risk of melanoma. he is to get regular skin checks.  He does see dermatology 2.  MCI  -Neurocognitive testing with Dr. Melvyn Novas in March, 2021 with evidence of MCI only.  3.  Depression  -Primary care prescribing sertraline, 100 mg daily.  Doing well  Subjective:   Walter Brown was seen today in follow up for Parkinsons disease.  My previous records were reviewed prior to todays visit as well as outside records available to me.  Patient was started on levodopa in February, after he sent me a patient message.  Pt denies falls.   Pt denies lightheadedness, near syncope.  No hallucinations. Only tremor with ambulation.  Walking for exercise.   Just prior to our last visit, primary care started him on low-dose sertraline.  This has been increased by primary care since our last visit 100 mg daily.  Reports today that he is doing well on the medication.  He does state that he had an "off" period.  Went to a wedding.  Was 8 hours without med and "I was like a wet noodle, " even with urinary incontinence.  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  Current prescribed movement disorder medications: Carbidopa/levodopa 25/100, 1 tablet 3 times per day (started since our last visit.)   ALLERGIES:   Allergies  Allergen Reactions   Albuterol Sulfate Palpitations   Azithromycin Other (See Comments)    Hepatotoxicity   Doxycycline Hyclate Other (See Comments)    Taken with Azithromycin and had Heaptotoxicity    CURRENT MEDICATIONS:  Outpatient Encounter Medications as of 05/04/2021  Medication Sig   amiodarone (PACERONE) 200 MG tablet Take 200 mg by mouth daily.   apixaban (ELIQUIS) 5 MG TABS tablet Take 1 tablet  (5 mg total) by mouth 2 (two) times daily.   carbidopa-levodopa (SINEMET IR) 25-100 MG tablet TAKE 1 TABLET BY MOUTH 3 (THREE) TIMES DAILY. 7AM/11AM/4PM   Coenzyme Q10 100 MG TABS Take 1 tablet by mouth 2 (two) times daily.   hydrochlorothiazide (HYDRODIURIL) 25 MG tablet Take 1 tablet (25 mg total) by mouth daily.   Melatonin 10 MG TABS Take 10 mg by mouth daily.   Multiple Vitamins-Minerals (ICAPS AREDS 2 PO) Take 1 capsule by mouth in the morning and at bedtime.   rosuvastatin (CRESTOR) 5 MG tablet Take 1 tablet (5 mg total) by mouth daily.   sertraline (ZOLOFT) 100 MG tablet Take 1 tablet (100 mg total) by mouth daily.   terazosin (HYTRIN) 10 MG capsule Take 10 mg by mouth at bedtime.    [DISCONTINUED] TURMERIC PO Take by mouth daily. (Patient not taking: No sig reported)   No facility-administered encounter medications on file as of 05/04/2021.    Objective:   PHYSICAL EXAMINATION:    VITALS:   Vitals:   05/04/21 1519  BP: 130/78  Pulse: 62  SpO2: 98%  Weight: 165 lb 12.8 oz (75.2 kg)  Height: '5\' 5"'$  (1.651 m)     GEN:  The patient appears stated age and is in NAD. HEENT:  Normocephalic, atraumatic.  The mucous membranes are moist. The superficial temporal arteries are without ropiness or tenderness. CV:  RRR Lungs:  CTAB Neck/HEME:  There are  no carotid bruits bilaterally.  Neurological examination:  Orientation: The patient is alert and oriented x3. Cranial nerves: There is good facial symmetry with facial hypomimia. The speech is fluent and clear. Soft palate rises symmetrically and there is no tongue deviation. Hearing is intact to conversational tone. Sensation: Sensation is intact to light touch throughout Motor: Strength is at least antigravity x4.  Movement examination: Tone: There is very mild increased tone in the rue Abnormal movements: none Coordination:  There is no decremation with any form of RAMS, including alternating supination and pronation of the  forearm, hand opening and closing, finger taps, heel taps and toe taps. Gait and Station: The patient has no difficulty arising out of a deep-seated chair without the use of the hands. The patient's stride length is good but he slightly drags the R leg and decreased arm swing on the R.    I have reviewed and interpreted the following labs independently    Chemistry      Component Value Date/Time   NA 141 10/20/2020 1426   NA 142 04/05/2020 0849   K 4.1 10/20/2020 1426   CL 105 10/20/2020 1426   CO2 30 10/20/2020 1426   BUN 17 10/20/2020 1426   BUN 14 04/05/2020 0849   CREATININE 1.16 10/20/2020 1426      Component Value Date/Time   CALCIUM 9.0 10/20/2020 1426   ALKPHOS 64 04/05/2020 0849   AST 21 10/20/2020 1426   ALT 18 10/20/2020 1426   BILITOT 0.6 10/20/2020 1426   BILITOT 0.9 04/05/2020 0849       Lab Results  Component Value Date   WBC 6.8 10/20/2020   HGB 14.0 10/20/2020   HCT 40.6 10/20/2020   MCV 93.3 10/20/2020   PLT 128 (L) 10/20/2020    Lab Results  Component Value Date   TSH 1.45 10/20/2020     Total time spent on today's visit was 20 minutes, including both face-to-face time and nonface-to-face time.  Time included that spent on review of records (prior notes available to me/labs/imaging if pertinent), discussing treatment and goals, answering patient's questions and coordinating care.  Cc:  [name], MD

## 2021-05-04 ENCOUNTER — Ambulatory Visit: Payer: PPO | Admitting: Neurology

## 2021-05-04 ENCOUNTER — Encounter: Payer: Self-pay | Admitting: Neurology

## 2021-05-04 ENCOUNTER — Other Ambulatory Visit: Payer: Self-pay

## 2021-05-04 VITALS — BP 130/78 | HR 62 | Ht 65.0 in | Wt 165.8 lb

## 2021-05-04 DIAGNOSIS — G20 Parkinson's disease: Secondary | ICD-10-CM

## 2021-05-04 MED ORDER — CARBIDOPA-LEVODOPA 25-100 MG PO TABS: 1.0000 | ORAL_TABLET | Freq: Three times a day (TID) | ORAL | 1 refills | Status: DC

## 2021-05-04 NOTE — Patient Instructions (Signed)
Online Resources for Power over Parkinson's Group   [employer] Online Groups  Power over Parkinson's Group :    Upcoming Power over [employer]:  2nd Wednesdays of the month at 2 pm:   Psychologist, occupational at [email or web address] if interested in participating in this group Parkinson's Care Partners Group:    3rd Mondays, Contact Misty Paladino Atypical Parkinsonian Patient Group:   4th Wednesdays, [place] If you are interested in participating in these groups with Misty, please contact her directly for how to join those meetings.  Her contact information is [email or web address].   [place]:  [email or web address] PD Health at Home continues:  Mindfulness Mondays, Expert Briefing Tuesdays, Wellness Wednesdays, Take Time Thursdays, Fitness Fridays Register for expert briefings (webinars) at [email or web address]  Please check out their website to sign up for emails and see their full online offerings  [place]:  [email or web address]  Check out additional information on their website to see their full online offerings  [facility]:  [email or web address] Upcoming Webinar:  Stay tuned Care Partner Monthly Meetup.  With [facility] [place].  First Tuesday of each month, 2 pm Joy Breaks:  First Wednesday of each month, 2-3 pm. There will be art, doodling, making, crafting, listening, laughing, stories, and everything in between. No art experience necessary. No supplies required. Just show up for joy!  Register on their website. Check out additional information to Live Well Today on their website  Parkinson and Movement Disorders (PMD) Alliance:  www.[email or web address] [employer] Online:  Online Education Events Sign up for emails, which are sent weekly to give you updates on programming and online offerings     Parkinson's Association of the [place]:   www.[email or web address] Information on online support groups, education events, and online exercises including Yoga, Parkinson's exercises and more-LOTS of information on links to PD resources and online events Virtual Support Group through Parkinson's Association of the [employer]; next one is scheduled for Wednesday, May 4th, 2022 at 2 pm. (These are typically scheduled for the 1st Wednesday of the month at 2 pm).  Visit website for details.  Additional links for movement activities: PWR! Moves Classes at [place] RESUMED!  Wednesdays 10 and 11 am.  Contact Amy Marriott, PT [email or web address] or [phone] if interested Here is a link to the PWR!Moves classes on Zoom from [phone] - Daily Mon-Sat at 10:00. Via Zoom, FREE and open to all.  There is also a link below via Facebook if you use that platform. [place] [email or web address] Parkinson's Wellness Recovery (PWR! Moves)  www.[email or web address] Info on the PWR! Virtual Experience:  You will have access to our expertise through self-assessment, guided plans that start with the PD-specific fundamentals, educational content, tips, Q&A with an expert, and a growing PrepaidParty.no of PD-specific pre-recorded and live exercise classes of varying types and intensity - both physical and cognitive! If that is not enough, we offer 1:1 wellness consultations (in-person or virtual) to personalize your PWR! Art therapist.  Check out the PWR! Move of the month on the [place] [facility] website:  [address] [email or web address] Fridays:  As part of the PD Health @ Home program, this free video series focuses each week on one aspect of fitness designed to support  people living with Parkinson's.  These weekly videos highlight the [place] recent fitness guidelines for people with Parkinson's disease.  [address] Dance for PD website is offering free, live-stream classes throughout the week, as well as links to HollywoodSale.dk of  classes:  [email or web address] Dance for Parkinson's Class:  [place].  Free offering for people with Parkinson's and care partners; virtual class.  For more information, contact [phone] or email Ruffin Frederick at [email or web address] Virtual dance and Pilates for Parkinson's classes: Click on the Community Tab> Parkinson's Movement Initiative Tab.  To register for classes and for more information, visit [email or web address] and click the "community" tab.     [employer] Parkinson's Cycling Classes  [employer]: 1pm on Fridays-Live classes at [employer] ([employer] [facility] [email or web address] or [phone]) [facility]: Virtual Classes Mondays and Thursdays Jeanette Caprice classes Tuesday, Wednesday and Thursday (contact [place] at [email or web address]  or [phone])  [facility] Boxing Three levels of classes are offered Tuesdays and Thursdays:  10:30 am,  12 noon & 1:45 pm at [facility].  Active Stretching with Paula Compton Class starting in March, on Fridays To observe a class or for  more information, call [phone] or email [email or web address] [employer]: Online Caregiver Education Opportunities:  [email or web address]-[email or web address]/caregiver-education/[email or web address]-support-group.  You may also contact Vickki Muff at [email or web address] or [phone].   Well-Spring Navigator:  ([phone] program, a free service to help individuals and families through the journey of determining care for older adults.  The "Navigator" is a  [employer], Education officer, museum, who will speak with a prospective client and/or loved ones to provide an assessment of the situation and a set of recommendations for a personalized care plan -- all free of charge, and whether Well[employer]Spring [employer] offers the needed service or not. If the need is not a service we provide, we are well-connected with reputable programs in town that we can refer you to.  www.well-[email or web address] or to speak with the Navigator, call [phone].

## 2021-05-21 ENCOUNTER — Other Ambulatory Visit: Payer: Self-pay

## 2021-05-21 ENCOUNTER — Other Ambulatory Visit (INDEPENDENT_AMBULATORY_CARE_PROVIDER_SITE_OTHER): Payer: PPO

## 2021-05-21 DIAGNOSIS — I10 Essential (primary) hypertension: Secondary | ICD-10-CM | POA: Diagnosis not present

## 2021-05-21 DIAGNOSIS — E782 Mixed hyperlipidemia: Secondary | ICD-10-CM | POA: Diagnosis not present

## 2021-05-21 DIAGNOSIS — R739 Hyperglycemia, unspecified: Secondary | ICD-10-CM | POA: Diagnosis not present

## 2021-05-21 LAB — HEMOGLOBIN A1C: Hgb A1c MFr Bld: 5.2 % (ref 4.6–6.5)

## 2021-05-21 LAB — LIPID PANEL
Cholesterol: 126 mg/dL (ref 0–200)
HDL: 69.8 mg/dL (ref >39.00)
LDL Cholesterol: 44 mg/dL (ref 0–99)
NonHDL: 55.89
Total CHOL/HDL Ratio: 2
Triglycerides: 60 mg/dL (ref 0.0–149.0)
VLDL: 12 mg/dL (ref 0.0–40.0)

## 2021-05-21 LAB — COMPREHENSIVE METABOLIC PANEL
ALT: 6 U/L (ref 0–53)
AST: 13 U/L (ref 0–37)
Albumin: 4.2 g/dL (ref 3.5–5.2)
Alkaline Phosphatase: 60 U/L (ref 39–117)
BUN: 20 mg/dL (ref 6–23)
CO2: 32 mEq/L (ref 19–32)
Calcium: 9 mg/dL (ref 8.4–10.5)
Chloride: 104 mEq/L (ref 96–112)
Creatinine, Ser: 1.08 mg/dL (ref 0.40–1.50)
GFR: 64.99 mL/min (ref >60.00)
Glucose, Bld: 90 mg/dL (ref 70–99)
Potassium: 3.5 mEq/L (ref 3.5–5.1)
Sodium: 142 mEq/L (ref 135–145)
Total Bilirubin: 0.8 mg/dL (ref 0.2–1.2)
Total Protein: 6.2 g/dL (ref 6.0–8.3)

## 2021-05-21 LAB — CBC
HCT: 39.8 % (ref 39.0–52.0)
Hemoglobin: 13.6 g/dL (ref 13.0–17.0)
MCHC: 34.2 g/dL (ref 30.0–36.0)
MCV: 94.4 fl (ref 78.0–100.0)
Platelets: 120 10*3/uL — ABNORMAL LOW (ref 150.0–400.0)
RBC: 4.21 Mil/uL — ABNORMAL LOW (ref 4.22–5.81)
RDW: 13.7 % (ref 11.5–15.5)
WBC: 6 10*3/uL (ref 4.0–10.5)

## 2021-05-21 LAB — TSH: TSH: 1.13 u[IU]/mL (ref 0.35–5.50)

## 2021-05-29 ENCOUNTER — Other Ambulatory Visit: Payer: Self-pay

## 2021-05-29 ENCOUNTER — Encounter: Payer: Self-pay | Admitting: Family Medicine

## 2021-05-29 ENCOUNTER — Ambulatory Visit (INDEPENDENT_AMBULATORY_CARE_PROVIDER_SITE_OTHER): Payer: PPO | Admitting: Family Medicine

## 2021-05-29 DIAGNOSIS — G20 Parkinson's disease: Secondary | ICD-10-CM

## 2021-05-29 DIAGNOSIS — E782 Mixed hyperlipidemia: Secondary | ICD-10-CM | POA: Diagnosis not present

## 2021-05-29 DIAGNOSIS — I10 Essential (primary) hypertension: Secondary | ICD-10-CM

## 2021-05-29 DIAGNOSIS — G3184 Mild cognitive impairment, so stated: Secondary | ICD-10-CM | POA: Diagnosis not present

## 2021-05-29 DIAGNOSIS — R739 Hyperglycemia, unspecified: Secondary | ICD-10-CM | POA: Diagnosis not present

## 2021-05-29 DIAGNOSIS — F0670 Mild neurocognitive disorder due to known physiological condition without behavioral disturbance: Secondary | ICD-10-CM

## 2021-05-29 DIAGNOSIS — F339 Major depressive disorder, recurrent, unspecified: Secondary | ICD-10-CM | POA: Diagnosis not present

## 2021-05-29 NOTE — Patient Instructions (Addendum)
Paxlovid is the new COVID medication we can give you if you get COVID so make sure you test if you have symptoms because we have to treat by day 5 of symptoms for it to be effective. If you are positive let us know so we can treat. If a home test is negative and your symptoms are persistent get a PCR test. Can check testing locations at [email or web address] If you are positive we will make an appointment with [place] and we will send in Paxlovid if you would like it. Check with your pharmacy before we meet to confirm they have it in stock, if they do not then we can get the prescription at the [place]    High Cholesterol  High cholesterol is a condition in which the blood has high levels of a white, waxy substance similar to fat (cholesterol). The liver makes all the cholesterol that the body needs. The human body needs small amounts of cholesterol to help build cells. A person gets extra orexcess cholesterol from the food that he or she eats. The blood carries cholesterol from the liver to the rest of the body. If you have high cholesterol, deposits (plaques) may build up on the walls of your arteries. Arteries are the blood vessels that carry blood away from your heart. These plaques make the arteries narrowand stiff. Cholesterol plaques increase your risk for heart attack and stroke. Work withyour health care provider to keep your cholesterol levels in a healthy range. What increases the risk? The following factors may make you more likely to develop this condition: Eating foods that are high in animal fat (saturated fat) or cholesterol. Being overweight. Not getting enough exercise. A family history of high cholesterol (familial hypercholesterolemia). Use of tobacco products. Having diabetes. What are the signs or symptoms? There are no symptoms of this condition. How is this diagnosed? This condition may be diagnosed based on the results of a blood test. If you are older than  80 years of age, your health care provider may check your cholesterol levels every 4-6 years. You may be checked more often if you have high cholesterol or other risk factors for heart disease. The blood test for cholesterol measures: "Bad" cholesterol, or LDL cholesterol. This is the main type of cholesterol that causes heart disease. The desired level is less than 100 mg/dL. "Good" cholesterol, or HDL cholesterol. HDL helps protect against heart disease by cleaning the arteries and carrying the LDL to the liver for processing. The desired level for HDL is 60 mg/dL or higher. Triglycerides. These are fats that your body can store or burn for energy. The desired level is less than 150 mg/dL. Total cholesterol. This measures the total amount of cholesterol in your blood and includes LDL, HDL, and triglycerides. The desired level is less than 200 mg/dL. How is this treated? This condition may be treated with: Diet changes. You may be asked to eat foods that have more fiber and less saturated fats or added sugar. Lifestyle changes. These may include regular exercise, maintaining a healthy weight, and quitting use of tobacco products. Medicines. These are given when diet and lifestyle changes have not worked. You may be prescribed a statin medicine to help lower your cholesterol levels. Follow these instructions at home: Eating and drinking  Eat a healthy, balanced diet. This diet includes: Daily servings of a variety of fresh, frozen, or canned fruits and vegetables. Daily servings of whole grain foods that are rich in fiber. Foods  that are low in saturated fats and trans fats. These include poultry and fish without skin, lean cuts of meat, and low-fat dairy products. A variety of fish, especially oily fish that contain omega-3 fatty acids. Aim to eat fish at least 2 times a week. Avoid foods and drinks that have added sugar. Use healthy cooking methods, such as roasting, grilling, broiling,  baking, poaching, steaming, and stir-frying. Do not fry your food except for stir-frying.  Lifestyle  Get regular exercise. Aim to exercise for a total of 150 minutes a week. Increase your activity level by doing activities such as gardening, walking, and taking the stairs. Do not use any products that contain nicotine or tobacco, such as cigarettes, e-cigarettes, and chewing tobacco. If you need help quitting, ask your health care provider.  General instructions Take over-the-counter and prescription medicines only as told by your health care provider. Keep all follow-up visits as told by your health care provider. This is important. Where to find more information American Heart Association: www.heart.org National Heart, Lung, and Blood Institute: https://wilson-eaton.com/ Contact a health care provider if: You have trouble achieving or maintaining a healthy diet or weight. You are starting an exercise program. You are unable to stop smoking. Get help right away if: You have chest pain. You have trouble breathing. You have any symptoms of a stroke. "BE FAST" is an easy way to remember the main warning signs of a stroke: B - Balance. Signs are dizziness, sudden trouble walking, or loss of balance. E - Eyes. Signs are trouble seeing or a sudden change in vision. F - Face. Signs are sudden weakness or numbness of the face, or the face or eyelid drooping on one side. A - Arms. Signs are weakness or numbness in an arm. This happens suddenly and usually on one side of the body. S - Speech. Signs are sudden trouble speaking, slurred speech, or trouble understanding what people say. T - Time. Time to call emergency services. Write down what time symptoms started. You have other signs of a stroke, such as: A sudden, severe headache with no known cause. Nausea or vomiting. Seizure. These symptoms may represent a serious problem that is an emergency. Do not wait to see if the symptoms will go away. Get  medical help right away. Call your local emergency services (911 in the [place] Do not drive yourself to the hospital. Summary Cholesterol plaques increase your risk for heart attack and stroke. Work with your health care provider to keep your cholesterol levels in a healthy range. Eat a healthy, balanced diet, get regular exercise, and maintain a healthy weight. Do not use any products that contain nicotine or tobacco, such as cigarettes, e-cigarettes, and chewing tobacco. Get help right away if you have any symptoms of a stroke. This information is not intended to replace advice given to you by your health care provider. Make sure you discuss any questions you have with your healthcare provider. Document Revised: 08/23/2019 Document Reviewed: 08/23/2019 Elsevier Patient Education  2022 [employer].

## 2021-05-29 NOTE — Progress Notes (Signed)
Patient ID: Walter Brown, male    DOB: 1941-03-27  Age: 80 y.o. MRN: [id number]    Subjective:  Subjective  HPI RAMEEK GRASSER presents for office visit today for follow up on hyperlipidemia and htn. He states that he is doing well and has no recent hospitalizations or recent ER visits to report. Has trouble with balance due to parkinson's, but denies any recent falls. Has lost license due to Parkinson's dx, but got pt evaluation and was able to get license back. Denies CP/palp/SOB/HA/congestion/fevers/GI or GU c/o. Taking meds as prescribed. He states that his thigh pain is better, but not gone and he still experiences weakness. Endorses taking a stool-softener.    Review of Systems  Constitutional:  Negative for chills, fatigue and fever.  HENT:  Negative for congestion, rhinorrhea, sinus pressure, sinus pain and sore throat.   Eyes:  Negative for pain.  Respiratory:  Negative for cough and shortness of breath.   Cardiovascular:  Negative for chest pain, palpitations and leg swelling.  Gastrointestinal:  Negative for abdominal pain, blood in stool, diarrhea, nausea and vomiting.  Genitourinary:  Negative for flank pain, frequency and penile pain.  Musculoskeletal:  Negative for back pain.  Neurological:  Positive for weakness. Negative for headaches.   History Past Medical History:  Diagnosis Date   Anemia    mild   Arthritis 04/06/2017   Asthma    childhood   ASVD (arteriosclerotic vascular disease) 09/07/2018   Basal cell carcinoma    skin- on nose- basal cell (20 yrs ago) forehead 1 year ago   BPH (benign prostatic hyperplasia)    Bradycardia 06/28/2018   Cerumen impaction 08/10/2012   Chicken pox as child   Coronary artery calcification 11/23/2019   [place] measles as a child   Hyperlipidemia    Hypertension    Irregular cardiac rhythm 03/10/2018   Macular degeneration    Major depressive disorder 08/01/2012   Mild neurocognitive disorder due to Parkinson's disease ([place])  12/27/2019   Obstructive sleep apnea 11/09/2018   Patient reports mild symptoms; was not prescribed a CPAP machine   Otitis externa 08/10/2012   PAF (paroxysmal atrial fibrillation)    Parkinson's disease 05/19/2019   Thrombocytopenia    TIA (transient ischemic attack) 03/10/2018    He has a past surgical history that includes Hydrocele excision / repair (2012); Skin cancer excision; Blepharoptosis repair; and Excisional hemorrhoidectomy.   His family history includes ADD / ADHD in his son; Cancer in his paternal grandfather; Heart disease in his father and mother; Hypertension in his father and mother; Other in his mother; Other (age of onset: 44) in his son; Parkinson's disease in his father; Prostate cancer in his maternal grandfather; Prostate cancer (age of onset: 74) in his father; Stroke in his maternal grandmother.He reports that he has quit smoking. His smoking use included cigarettes. He started smoking about 42 years ago. He has a 22.50 pack-year smoking history. He has never used smokeless tobacco. He reports current alcohol use of about 7.0 - 14.0 standard drinks per week. He reports that he does not use drugs.  Current Outpatient Medications on File Prior to Visit  Medication Sig Dispense Refill   amiodarone (PACERONE) 200 MG tablet Take 200 mg by mouth daily.     apixaban (ELIQUIS) 5 MG TABS tablet Take 1 tablet (5 mg total) by mouth 2 (two) times daily. 180 tablet 3   carbidopa-levodopa (SINEMET IR) 25-100 MG tablet Take 1 tablet by mouth 3 (three) times  daily. 7am/11am/4pm 270 tablet 1   Coenzyme Q10 100 MG TABS Take 1 tablet by mouth 2 (two) times daily.     hydrochlorothiazide (HYDRODIURIL) 25 MG tablet Take 1 tablet (25 mg total) by mouth daily. 90 tablet 1   Melatonin 10 MG TABS Take 10 mg by mouth daily.     Multiple Vitamins-Minerals (ICAPS AREDS 2 PO) Take 1 capsule by mouth in the morning and at bedtime.     rosuvastatin (CRESTOR) 5 MG tablet Take 1 tablet (5 mg total) by  mouth daily. 90 tablet 1   sertraline (ZOLOFT) 100 MG tablet Take 1 tablet (100 mg total) by mouth daily. 90 tablet 1   terazosin (HYTRIN) 10 MG capsule Take 10 mg by mouth at bedtime.      No current facility-administered medications on file prior to visit.     Objective:  Objective  Physical Exam Constitutional:      General: He is not in acute distress.    Appearance: Normal appearance. He is not ill-appearing or toxic-appearing.  HENT:     Head: Normocephalic and atraumatic.     Right Ear: Tympanic membrane, ear canal and external ear normal.     Left Ear: Tympanic membrane, ear canal and external ear normal.     Nose: No congestion or rhinorrhea.  Eyes:     Extraocular Movements: Extraocular movements intact.     Pupils: Pupils are equal, round, and reactive to light.  Cardiovascular:     Rate and Rhythm: Normal rate and regular rhythm.     Pulses: Normal pulses.     Heart sounds: Normal heart sounds. No murmur heard. Pulmonary:     Effort: Pulmonary effort is normal. No respiratory distress.     Breath sounds: Normal breath sounds. No wheezing, rhonchi or rales.  Abdominal:     General: Bowel sounds are normal.     Palpations: Abdomen is soft. There is no mass.     Tenderness: no abdominal tenderness There is no guarding.     Hernia: No hernia is present.  Musculoskeletal:        General: Normal range of motion.     Cervical back: Normal range of motion and neck supple.  Skin:    General: Skin is warm and dry.  Neurological:     Mental Status: He is alert and oriented to person, place, and time.  Psychiatric:        Behavior: Behavior normal.   BP 106/60   Pulse 61   Temp 98 F (36.7 C)   Resp 16   Wt 164 lb 9.6 oz (74.7 kg)   SpO2 98%   BMI 27.39 kg/m  Wt Readings from Last 3 Encounters:  05/29/21 164 lb 9.6 oz (74.7 kg)  05/04/21 165 lb 12.8 oz (75.2 kg)  03/13/21 164 lb 3.2 oz (74.5 kg)     Lab Results  Component Value Date   WBC 6.0 05/21/2021    HGB 13.6 05/21/2021   HCT 39.8 05/21/2021   PLT 120.0 (L) 05/21/2021   GLUCOSE 90 05/21/2021   CHOL 126 05/21/2021   TRIG 60.0 05/21/2021   HDL 69.80 05/21/2021   LDLCALC 44 05/21/2021   ALT 6 05/21/2021   AST 13 05/21/2021   NA 142 05/21/2021   K 3.5 05/21/2021   CL 104 05/21/2021   CREATININE 1.08 05/21/2021   BUN 20 05/21/2021   CO2 32 05/21/2021   TSH 1.13 05/21/2021   INR 1.0 12/15/2017   HGBA1C  5.2 05/21/2021    NM BRAIN DATSCAN TUMOR LOC INFLAM SPECT 1 DAY  Result Date: 10/14/2019 CLINICAL DATA:  80 year old male.  RIGHT hand tremor. EXAM: NUCLEAR MEDICINE BRAIN IMAGING WITH SPECT  (DaTscan ) TECHNIQUE: SPECT images of the brain were obtained after intravenous injection of radiopharmaceutical. 4 hour post injection imaging. Appropriate positioning. 0.8 ml lugols solution administered in a.m RADIOPHARMACEUTICALS:  5.1 millicuries I [id number] Ioflupane COMPARISON:  Brain MRI 07/02/2019 FINDINGS: Marked decreased activity in the posterior LEFT striatum (putamen). Preserved activity in the head of the LEFT caudate nucleus. Activity in the RIGHT striata is relatively normal with some potential tapering of the posterior striatum. IMPRESSION: Clear loss of dopamine transport activity within the posterior LEFT striatum. Pattern typical of Parkinson's syndrome pathology. Electronically Signed   By: Suzy Bouchard M.D.   On: 10/14/2019 14:33     Assessment & Plan:  Plan    No orders of the defined types were placed in this encounter.   Problem List Items Addressed This Visit     Hyperlipidemia    Tolerating statin, encouraged heart healthy diet, avoid trans fats, minimize simple carbs and saturated fats. Increase exercise as tolerated      Hypertension    Well controlled, no changes to meds. Encouraged heart healthy diet such as the DASH diet and exercise as tolerated.       Mild neurocognitive disorder due to Parkinson's disease [facility])    He notes his balance is worsening some. He  is exercising and trying to stay fit. He will consider more PT if symptoms worsen      Depression, recurrent ([place])    He notes he is stable on Sertraline no changes      Hyperglycemia    hgba1c acceptable, minimize simple carbs. Increase exercise as tolerated.        Follow-up: Return in about 3 months (around 08/29/2021).  I, Suezanne Jacquet, acting as a scribe for Penni Homans, MD, have documented all relevent documentation on behalf of Penni Homans, MD, as directed by Penni Homans, MD while in the presence of Penni Homans, MD.  I, [name], MD personally performed the services described in this documentation. All medical record entries made by the scribe were at my direction and in my presence. I have reviewed the chart and agree that the record reflects my personal performance and is accurate and complete

## 2021-05-30 NOTE — Assessment & Plan Note (Signed)
Tolerating statin, encouraged heart healthy diet, avoid trans fats, minimize simple carbs and saturated fats. Increase exercise as tolerated 

## 2021-05-30 NOTE — Assessment & Plan Note (Signed)
hgba1c acceptable, minimize simple carbs. Increase exercise as tolerated.  

## 2021-05-30 NOTE — Assessment & Plan Note (Signed)
He notes his balance is worsening some. He is exercising and trying to stay fit. He will consider more PT if symptoms worsen

## 2021-05-30 NOTE — Assessment & Plan Note (Signed)
He notes he is stable on Sertraline no changes

## 2021-05-30 NOTE — Assessment & Plan Note (Signed)
Well controlled, no changes to meds. Encouraged heart healthy diet such as the DASH diet and exercise as tolerated.  °

## 2021-06-21 DIAGNOSIS — H353221 Exudative age-related macular degeneration, left eye, with active choroidal neovascularization: Secondary | ICD-10-CM | POA: Diagnosis not present

## 2021-06-21 DIAGNOSIS — H353112 Nonexudative age-related macular degeneration, right eye, intermediate dry stage: Secondary | ICD-10-CM | POA: Diagnosis not present

## 2021-06-26 ENCOUNTER — Ambulatory Visit (INDEPENDENT_AMBULATORY_CARE_PROVIDER_SITE_OTHER): Payer: PPO | Admitting: Pharmacist

## 2021-06-26 DIAGNOSIS — I10 Essential (primary) hypertension: Secondary | ICD-10-CM

## 2021-06-26 DIAGNOSIS — F339 Major depressive disorder, recurrent, unspecified: Secondary | ICD-10-CM

## 2021-06-26 DIAGNOSIS — F0670 Mild neurocognitive disorder due to known physiological condition without behavioral disturbance: Secondary | ICD-10-CM

## 2021-06-26 DIAGNOSIS — E782 Mixed hyperlipidemia: Secondary | ICD-10-CM

## 2021-06-26 DIAGNOSIS — G20 Parkinson's disease: Secondary | ICD-10-CM

## 2021-06-29 NOTE — Patient Instructions (Signed)
Visit Information  PATIENT GOALS:  Goals Addressed             This Visit's Progress    Chronic Care Management Pharmacy Care Plan   On track    [place] (see longitudinal plan of care for additional care plan information)  Current Barriers:  Chronic Disease Management support, education, and care coordination needs related to Hypertension, Hyperlipidemia/TIA, AFib, Depression, BPH, Parkinson's Disease   Hypertension BP Readings from Last 3 Encounters:  05/29/21 106/60  05/04/21 130/78  03/13/21 130/86  Pharmacist Clinical Goal(s): Over the next 90 days, patient will work with PharmD and providers to maintain blood pressure  goal <130/80 Current regimen:  Hydrochlorothiazide 25mg  daily in morning Interventions: Discussed blood pressure goal Requested patient record blood pressure readings  Patient self care activities - Over the next 90 days, patient will: Check blood pressure  1 to 2 times per week, document, and provide at future appointments Ensure daily salt intake < 2300 mg/day  Hyperlipidemia Lab Results  Component Value Date/Time   LDLCALC 44 05/21/2021 09:42 AM   LDLCALC 36 10/20/2020 02:26 PM  Pharmacist Clinical Goal(s): Over the next 90 days, patient will work with PharmD and providers to maintain LDL goal < 70 Current regimen:  Rosuvastatin 5mg  daily CoEnzyme Q10 100mg  daily Interventions: Reviewed last lipid panel and discuss goals Discussed benefits of statin for risk reduction for cardiovascular disease Recommended to continue current medication and exercise regimen   Parkinson's Disease (goal: decrease symptoms of PD - weakness, slightly tremor): Pharmacist Clinical Goal(s) Over the next 90 days, patient will work with PharmD and providers to reduce symptoms of Parkinson's Disease Current regimen:  Carbidopa / Levodopa 25/100mg  3 times a day (7am, 11am and 4pm) Patient self care activities - Over the next 90 days, patient will:  Continue  current medication F/u with Dr Tat regarding early morning symptoms   Atrial fibrillation Pharmacist Clinical Goal(s) Over the next 90 days, patient will work with PharmD and providers to reduce risk of stroke due to Afib Current regimen:  Amiodarone 200mg  daily Eliquis 5mg  twice daily Interventions: Discussed how Eliquis prevents strokes in comparison to statins Patient self care activities - Over the next 90 days, patient will: Maintain current medication regimen Contact clinical pharmacist if cost of Eliquis increases  Depression Pharmacist Clinical Goal(s) Over the next 90 days, patient will work with PharmD and providers to reduce symptoms associated with depression Current regimen:  Sertraline 100mg  daily Interventions: Discussed option of speaking to counselor regarding feelings about Parkinson's disease Patient self care activities - Over the next 90 days, patient will: Consider accepting referral to speak to someone regarding his feelings about his Parkinson's diagnosis  Continue sertraline  Consider going to [facility] - for exercise and socializing or consider joining group for Paddleball or Pickle ball  Medication management Pharmacist Clinical Goal(s): Over the next 90 days, patient will work with PharmD and providers to achieve optimal medication adherence Current pharmacy: [facility] Interventions Comprehensive medication review performed. Continue current medication management strategy Patient self care activities - Over the next 90 days, patient will: Focus on medication adherence by filling and taking medications appropriately  Take medications as prescribed Report any questions or concerns to PharmD and/or provider(s)  Please see past updates related to this goal by clicking on the "Past Updates" button in the selected goal          Patient verbalizes understanding of instructions provided today and agrees to view in [place].  Telephone follow up appointment with  care management team member scheduled for: 36 days  [name], PharmD Clinical Pharmacist [facility].
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
## 2021-07-06 DIAGNOSIS — I10 Essential (primary) hypertension: Secondary | ICD-10-CM | POA: Diagnosis not present

## 2021-07-06 DIAGNOSIS — F339 Major depressive disorder, recurrent, unspecified: Secondary | ICD-10-CM

## 2021-07-06 DIAGNOSIS — E782 Mixed hyperlipidemia: Secondary | ICD-10-CM

## 2021-07-23 DIAGNOSIS — N401 Enlarged prostate with lower urinary tract symptoms: Secondary | ICD-10-CM | POA: Diagnosis not present

## 2021-07-23 DIAGNOSIS — Z125 Encounter for screening for malignant neoplasm of prostate: Secondary | ICD-10-CM | POA: Diagnosis not present

## 2021-07-23 DIAGNOSIS — R3912 Poor urinary stream: Secondary | ICD-10-CM | POA: Diagnosis not present

## 2021-07-30 DIAGNOSIS — L578 Other skin changes due to chronic exposure to nonionizing radiation: Secondary | ICD-10-CM | POA: Diagnosis not present

## 2021-07-30 DIAGNOSIS — L570 Actinic keratosis: Secondary | ICD-10-CM | POA: Diagnosis not present

## 2021-07-30 DIAGNOSIS — Z23 Encounter for immunization: Secondary | ICD-10-CM | POA: Diagnosis not present

## 2021-07-30 DIAGNOSIS — L821 Other seborrheic keratosis: Secondary | ICD-10-CM | POA: Diagnosis not present

## 2021-07-30 DIAGNOSIS — L814 Other melanin hyperpigmentation: Secondary | ICD-10-CM | POA: Diagnosis not present

## 2021-07-30 DIAGNOSIS — Z86018 Personal history of other benign neoplasm: Secondary | ICD-10-CM | POA: Diagnosis not present

## 2021-07-30 DIAGNOSIS — D225 Melanocytic nevi of trunk: Secondary | ICD-10-CM | POA: Diagnosis not present

## 2021-07-30 DIAGNOSIS — Z85828 Personal history of other malignant neoplasm of skin: Secondary | ICD-10-CM | POA: Diagnosis not present

## 2021-08-13 ENCOUNTER — Telehealth: Payer: Self-pay | Admitting: Neurology

## 2021-08-13 NOTE — Telephone Encounter (Signed)
Patient is getting unsteady on his feet and sliding off of a coach or a bed. Patient will not consider upping his medication. Wife said patient got a stationary bike and a punching bag at the house and seems very uninterested. I told patients wife that we do have the parkinson's groups and asked if patient appeared depressed she said yes but she said he also is very stubborn and wont do anything to help his situation. Patients wife wanting Dr. Carles Collet to call patient but I did let her know that is not possible with the work flow and number of patients Dr. Carles Collet cant call patients to just check in. She seems just kind of frustrated and reaching out for some help

## 2021-08-13 NOTE — Telephone Encounter (Signed)
[name] called and said the patient is not wanting to increase his carbidopa levodopa. She'd like to speak with the doctor or medical assistant.  The patient is away from the house and she'd like a call back while he is away, if possible.

## 2021-09-05 ENCOUNTER — Other Ambulatory Visit: Payer: Self-pay | Admitting: Family Medicine

## 2021-09-07 ENCOUNTER — Telehealth: Payer: Self-pay | Admitting: Neurology

## 2021-09-07 DIAGNOSIS — G20 Parkinson's disease: Secondary | ICD-10-CM

## 2021-09-07 NOTE — Telephone Encounter (Signed)
Referral placed to [facility]

## 2021-09-07 NOTE — Telephone Encounter (Signed)
Patent would like to get a referral for PT at [facility] he said.

## 2021-09-11 ENCOUNTER — Ambulatory Visit (INDEPENDENT_AMBULATORY_CARE_PROVIDER_SITE_OTHER): Payer: PPO | Admitting: Family Medicine

## 2021-09-11 VITALS — BP 130/70 | HR 58 | Temp 97.6°F | Resp 12 | Ht 65.0 in | Wt 169.6 lb

## 2021-09-11 DIAGNOSIS — R2681 Unsteadiness on feet: Secondary | ICD-10-CM | POA: Diagnosis not present

## 2021-09-11 DIAGNOSIS — E782 Mixed hyperlipidemia: Secondary | ICD-10-CM

## 2021-09-11 DIAGNOSIS — I10 Essential (primary) hypertension: Secondary | ICD-10-CM

## 2021-09-11 DIAGNOSIS — G20 Parkinson's disease: Secondary | ICD-10-CM

## 2021-09-11 DIAGNOSIS — R739 Hyperglycemia, unspecified: Secondary | ICD-10-CM | POA: Diagnosis not present

## 2021-09-11 DIAGNOSIS — R1310 Dysphagia, unspecified: Secondary | ICD-10-CM

## 2021-09-11 HISTORY — DX: Unsteadiness on feet: R26.81

## 2021-09-11 HISTORY — DX: Dysphagia, unspecified: R13.10

## 2021-09-11 LAB — COMPREHENSIVE METABOLIC PANEL
ALT: 7 U/L (ref 0–53)
AST: 24 U/L (ref 0–37)
Albumin: 4 g/dL (ref 3.5–5.2)
Alkaline Phosphatase: 63 U/L (ref 39–117)
BUN: 19 mg/dL (ref 6–23)
CO2: 31 mEq/L (ref 19–32)
Calcium: 8.6 mg/dL (ref 8.4–10.5)
Chloride: 106 mEq/L (ref 96–112)
Creatinine, Ser: 1.01 mg/dL (ref 0.40–1.50)
GFR: 70.28 mL/min (ref >60.00)
Glucose, Bld: 75 mg/dL (ref 70–99)
Potassium: 3.7 mEq/L (ref 3.5–5.1)
Sodium: 141 mEq/L (ref 135–145)
Total Bilirubin: 0.7 mg/dL (ref 0.2–1.2)
Total Protein: 5.9 g/dL — ABNORMAL LOW (ref 6.0–8.3)

## 2021-09-11 LAB — LIPID PANEL
Cholesterol: 121 mg/dL (ref 0–200)
HDL: 70.5 mg/dL (ref >39.00)
LDL Cholesterol: 38 mg/dL (ref 0–99)
NonHDL: 50.43
Total CHOL/HDL Ratio: 2
Triglycerides: 62 mg/dL (ref 0.0–149.0)
VLDL: 12.4 mg/dL (ref 0.0–40.0)

## 2021-09-11 LAB — CBC
HCT: 38.9 % — ABNORMAL LOW (ref 39.0–52.0)
Hemoglobin: 13 g/dL (ref 13.0–17.0)
MCHC: 33.5 g/dL (ref 30.0–36.0)
MCV: 96.2 fl (ref 78.0–100.0)
Platelets: 119 10*3/uL — ABNORMAL LOW (ref 150.0–400.0)
RBC: 4.05 Mil/uL — ABNORMAL LOW (ref 4.22–5.81)
RDW: 13.8 % (ref 11.5–15.5)
WBC: 6.2 10*3/uL (ref 4.0–10.5)

## 2021-09-11 LAB — HEMOGLOBIN A1C: Hgb A1c MFr Bld: 5.2 % (ref 4.6–6.5)

## 2021-09-11 LAB — TSH: TSH: 1.13 u[IU]/mL (ref 0.35–5.50)

## 2021-09-11 NOTE — Progress Notes (Signed)
Patient ID: Walter Brown, male    DOB: 1941-03-28  Age: 80 y.o. MRN: [id number]    Subjective:   Chief Complaint  Patient presents with   3 month follow up   Subjective   HPI Walter Brown presents for office visit today for follow up on HTN and Parkinson's disease. He has c/o throat catching with onset a few weeks ago. Otherwise he has no recent illnesses or ER visits to report.  Throat catching: He notices it when taking medication, but not with food or drinks. He sometimes gets throat pain as well. He has excess phlegm and one time he gagged, but otherwise he has no pain or discomfort. He took 2 acetylcholine last night and his phlegm is about the same amount this morning.  Parkinson's: Symptoms are currently minor and tolerable. However, he has had a fall 2 weeks ago and bruised his eye, but he did not pass out or have any other injuries[facility]  He takes 2-3 capsules of psyllium when having BM difficulties. Denies CP/palp/SOB/HA/congestion/fevers/GI or GU c/o. Taking meds as prescribed.   Review of Systems  Constitutional:  Negative for chills, fatigue and fever.  HENT:  Negative for congestion, rhinorrhea, sinus pressure, sinus pain and sore throat.   Eyes:  Negative for pain.  Respiratory:  Negative for cough and shortness of breath.   Cardiovascular:  Negative for chest pain, palpitations and leg swelling.  Gastrointestinal:  Negative for abdominal pain, blood in stool, diarrhea, nausea and vomiting.  Genitourinary:  Negative for flank pain, frequency and penile pain.  Musculoskeletal:  Negative for back pain.  Neurological:  Negative for headaches.   History Past Medical History:  Diagnosis Date   Anemia    mild   Arthritis 04/06/2017   Asthma    childhood   ASVD (arteriosclerotic vascular disease) 09/07/2018   Basal cell carcinoma    skin- on nose- basal cell (20 yrs ago) forehead 1 year ago   BPH (benign prostatic hyperplasia)     Bradycardia 06/28/2018   Cerumen impaction 08/10/2012   Chicken pox as child   Coronary artery calcification 11/23/2019   [place] measles as a child   Hyperlipidemia    Hypertension    Irregular cardiac rhythm 03/10/2018   Macular degeneration    Major depressive disorder 08/01/2012   Mild neurocognitive disorder due to Parkinson's disease ([place]) 12/27/2019   Obstructive sleep apnea 11/09/2018   Patient reports mild symptoms; was not prescribed a CPAP machine   Otitis externa 08/10/2012   PAF (paroxysmal atrial fibrillation)    Parkinson's disease 05/19/2019   Thrombocytopenia    TIA (transient ischemic attack) 03/10/2018    He has a past surgical history that includes Hydrocele excision / repair (2012); Skin cancer excision; Blepharoptosis repair; and Excisional hemorrhoidectomy.   His family history includes ADD / ADHD in his son; Cancer in his paternal grandfather; Heart disease in his father and mother; Hypertension in his father and mother; Other in his mother; Other (age of onset: 64) in his son; Parkinson's disease in his father; Prostate cancer in his maternal grandfather; Prostate cancer (age of onset: 71) in his father; Stroke in his maternal grandmother.He reports that he has quit smoking. His smoking use included cigarettes. He started smoking about 42 years ago. He has a 22.50 pack-year smoking history. He has never used smokeless tobacco. He reports current alcohol use of about 7.0 - 14.0 standard drinks per week. He reports that he  does not use drugs.  Current Outpatient Medications on File Prior to Visit  Medication Sig Dispense Refill   amiodarone (PACERONE) 200 MG tablet Take 200 mg by mouth daily.     apixaban (ELIQUIS) 5 MG TABS tablet Take 1 tablet (5 mg total) by mouth 2 (two) times daily. 180 tablet 3   carbidopa-levodopa (SINEMET IR) 25-100 MG tablet Take 1 tablet by mouth 3 (three) times daily. 7am/11am/4pm 270 tablet 1   Coenzyme Q10 100 MG TABS Take 1 tablet by mouth 2  (two) times daily.     hydrochlorothiazide (HYDRODIURIL) 25 MG tablet Take 1 tablet (25 mg total) by mouth daily. 90 tablet 1   Melatonin 10 MG TABS Take 10 mg by mouth daily.     Multiple Vitamins-Minerals (ICAPS AREDS 2 PO) Take 1 capsule by mouth in the morning and at bedtime.     rosuvastatin (CRESTOR) 5 MG tablet TAKE 1 TABLET BY MOUTH EVERY DAY 90 tablet 1   sertraline (ZOLOFT) 100 MG tablet TAKE 1 TABLET BY MOUTH EVERY DAY 90 tablet 1   terazosin (HYTRIN) 10 MG capsule Take 10 mg by mouth at bedtime.      No current facility-administered medications on file prior to visit.     Objective:  Objective  Physical Exam Constitutional:      General: He is not in acute distress.    Appearance: Normal appearance. He is not ill-appearing or toxic-appearing.  HENT:     Head: Normocephalic and atraumatic.     Right Ear: Tympanic membrane, ear canal and external ear normal.     Left Ear: Tympanic membrane, ear canal and external ear normal.     Nose: No congestion or rhinorrhea.     Mouth/Throat:     Mouth: Mucous membranes are moist. No lacerations or oral lesions.     Tonsils: No tonsillar exudate or tonsillar abscesses.  Eyes:     Extraocular Movements: Extraocular movements intact.     Pupils: Pupils are equal, round, and reactive to light.  Cardiovascular:     Rate and Rhythm: Normal rate and regular rhythm.     Pulses: Normal pulses.     Heart sounds: Normal heart sounds. No murmur heard. Pulmonary:     Effort: Pulmonary effort is normal. No respiratory distress.     Breath sounds: Normal breath sounds. No wheezing, rhonchi or rales.  Abdominal:     General: Bowel sounds are normal.     Palpations: Abdomen is soft. There is no mass.     Tenderness: There is no abdominal tenderness. There is no guarding.     Hernia: No hernia is present.  Musculoskeletal:        General: Normal range of motion.     Cervical back: Normal range of motion and neck supple.  Skin:    General:  Skin is warm and dry.  Neurological:     Mental Status: He is alert and oriented to person, place, and time.  Psychiatric:        Behavior: Behavior normal.   BP 130/70 (BP Location: Left Arm, Cuff Size: Normal)   Pulse (!) 58   Temp 97.6 F (36.4 C) (Oral)   Resp 12   Ht 5\' 5"  (1.651 m)   Wt 169 lb 9.6 oz (76.9 kg)   SpO2 97%   BMI 28.22 kg/m  Wt Readings from Last 3 Encounters:  09/11/21 169 lb 9.6 oz (76.9 kg)  05/29/21 164 lb 9.6 oz (74.7 kg)  05/04/21 165 lb 12.8 oz (75.2 kg)     Lab Results  Component Value Date   WBC 6.0 05/21/2021   HGB 13.6 05/21/2021   HCT 39.8 05/21/2021   PLT 120.0 (L) 05/21/2021   GLUCOSE 90 05/21/2021   CHOL 126 05/21/2021   TRIG 60.0 05/21/2021   HDL 69.80 05/21/2021   LDLCALC 44 05/21/2021   ALT 6 05/21/2021   AST 13 05/21/2021   NA 142 05/21/2021   K 3.5 05/21/2021   CL 104 05/21/2021   CREATININE 1.08 05/21/2021   BUN 20 05/21/2021   CO2 32 05/21/2021   TSH 1.13 05/21/2021   INR 1.0 12/15/2017   HGBA1C 5.2 05/21/2021    NM BRAIN DATSCAN TUMOR LOC INFLAM SPECT 1 DAY  Result Date: 10/14/2019 CLINICAL DATA:  80 year old male.  RIGHT hand tremor. EXAM: NUCLEAR MEDICINE BRAIN IMAGING WITH SPECT  (DaTscan ) TECHNIQUE: SPECT images of the brain were obtained after intravenous injection of radiopharmaceutical. 4 hour post injection imaging. Appropriate positioning. 0.8 ml lugols solution administered in a.m RADIOPHARMACEUTICALS:  5.1 millicuries I 397 Ioflupane COMPARISON:  Brain MRI 07/02/2019 FINDINGS: Marked decreased activity in the posterior LEFT striatum (putamen). Preserved activity in the head of the LEFT caudate nucleus. Activity in the RIGHT striata is relatively normal with some potential tapering of the posterior striatum. IMPRESSION: Clear loss of dopamine transport activity within the posterior LEFT striatum. Pattern typical of Parkinson's syndrome pathology. Electronically Signed   By: Suzy Bouchard M.D.   On: 10/14/2019  14:33     Assessment & Plan:  Plan    No orders of the defined types were placed in this encounter.   Problem List Items Addressed This Visit     Hyperlipidemia   Relevant Orders   Lipid panel   Hypertension   Relevant Orders   CBC   Comprehensive metabolic panel   TSH   Parkinson's disease ([place])    Is following with neurology and is struggling with worsening unsteady gait and some brain fog but he continues to work hard to motivate to exercise. [facility], and weights.      Relevant Orders   Ambulatory referral to Physical Therapy   Hyperglycemia    hgba1c acceptable, minimize simple carbs. Increase exercise as tolerated.       Relevant Orders   Hemoglobin A1c   Dysphagia    He notes for about the last month he has noted more and more trouble with swallowing his pills. He is doing ok with eating and drinking but he now has to take just 1 tab at a time when he used to be able to take several at a time. That adjustment has helped and he is not having as much trouble. He will let us know if he is ready for a referral to gastroenterology for further evaluation      Unsteady gait - Primary    Referred to physical therapy in [facility] for evaluation and treatment.       Relevant Orders   Ambulatory referral to Physical Therapy    Follow-up: Return in about 3 months (around 12/10/2021) for 4 mn f/u.  I, Suezanne Jacquet, acting as a scribe for Penni Homans, MD, have documented all relevent documentation on behalf of Penni Homans, MD, as directed by Penni Homans, MD while in the presence of Penni Homans, MD. DO:09/11/21.  I, [name], MD personally performed the services described in this documentation. All medical  record entries made by the scribe were at my direction and in my presence. I have reviewed the chart and agree that the record reflects my personal performance and is accurate and complete

## 2021-09-11 NOTE — Assessment & Plan Note (Signed)
hgba1c acceptable, minimize simple carbs. Increase exercise as tolerated.  

## 2021-09-11 NOTE — Assessment & Plan Note (Signed)
Is following with neurology and is struggling with worsening unsteady gait and some brain fog but he continues to work hard to motivate to exercise. [facility], and weights.

## 2021-09-11 NOTE — Assessment & Plan Note (Signed)
He notes for about the last month he has noted more and more trouble with swallowing his pills. He is doing ok with eating and drinking but he now has to take just 1 tab at a time when he used to be able to take several at a time. That adjustment has helped and he is not having as much trouble. He will let us know if he is ready for a referral to gastroenterology for further evaluation

## 2021-09-11 NOTE — Assessment & Plan Note (Signed)
Referred to physical therapy in [facility] for evaluation and treatment.

## 2021-09-11 NOTE — Patient Instructions (Addendum)
Try out chair yoga  Tremor A tremor is trembling or shaking that you cannot control. Most tremors affect the hands or arms. Tremors can also affect the head, vocal cords, face, and other parts of the body. There are many types of tremors. Common types include: Essential tremor. These usually occur in people older than 40. It may run in families and can happen in otherwise healthy people. Resting tremor. These occur when the muscles are at rest, such as when your hands are resting in your lap. People with Parkinson's disease often have resting tremors. Postural tremor. These occur when you try to hold a pose, such as keeping your hands outstretched. Kinetic tremor. These occur during purposeful movement, such as trying to touch a finger to your nose. Task-specific tremor. These may occur when you perform certain tasks such as writing, speaking, or standing. Psychogenic tremor. These dramatically lessen or disappear when you are distracted. They can happen in people of all ages. Some types of tremors have no known cause. Tremors can also be a symptom of nervous system problems (neurological disorders) that may occur with aging. Some tremors go away with treatment, while others do not. Follow these instructions at home: Lifestyle   Limit alcohol intake to no more than 1 drink a day for nonpregnant women and 2 drinks a day for men. One drink equals 12 oz of beer, 5 oz of wine, or 1 oz of hard liquor. Do not use any products that contain nicotine or tobacco, such as cigarettes and e-cigarettes. If you need help quitting, ask your health care provider. Avoid extreme heat and extreme cold. Limit your caffeine intake, as told by your health care provider. Try to get 8 hours of sleep each night. Find ways to manage your stress, such as meditation or yoga. General instructions Take over-the-counter and prescription medicines only as told by your health care provider. Keep all follow-up visits as told by  your health care provider. This is important. Contact a health care provider if you: Develop a tremor after starting a new medicine. Have a tremor along with other symptoms such as: Numbness. Tingling. Pain. Weakness. Notice that your tremor gets worse. Notice that your tremor interferes with your day-to-day life. Summary A tremor is trembling or shaking that you cannot control. Most tremors affect the hands or arms. Some types of tremors have no known cause. Others may be a symptom of nervous system problems (neurological disorders). Make sure you discuss any tremors you have with your health care provider. This information is not intended to replace advice given to you by your health care provider. Make sure you discuss any questions you have with your health care provider. Document Revised: 06/14/2020 Document Reviewed: 06/16/2020 Elsevier Patient Education  2022 [employer].

## 2021-09-12 DIAGNOSIS — H353221 Exudative age-related macular degeneration, left eye, with active choroidal neovascularization: Secondary | ICD-10-CM | POA: Diagnosis not present

## 2021-09-20 DIAGNOSIS — G20 Parkinson's disease: Secondary | ICD-10-CM | POA: Diagnosis not present

## 2021-09-20 DIAGNOSIS — R2681 Unsteadiness on feet: Secondary | ICD-10-CM | POA: Diagnosis not present

## 2021-10-03 DIAGNOSIS — R2681 Unsteadiness on feet: Secondary | ICD-10-CM | POA: Diagnosis not present

## 2021-10-03 DIAGNOSIS — G20 Parkinson's disease: Secondary | ICD-10-CM | POA: Diagnosis not present

## 2021-10-05 DIAGNOSIS — G20 Parkinson's disease: Secondary | ICD-10-CM | POA: Diagnosis not present

## 2021-10-05 DIAGNOSIS — R2681 Unsteadiness on feet: Secondary | ICD-10-CM | POA: Diagnosis not present

## 2021-10-08 ENCOUNTER — Encounter: Payer: Self-pay | Admitting: Family Medicine

## 2021-10-08 DIAGNOSIS — R1310 Dysphagia, unspecified: Secondary | ICD-10-CM

## 2021-10-11 DIAGNOSIS — R2681 Unsteadiness on feet: Secondary | ICD-10-CM | POA: Diagnosis not present

## 2021-10-11 DIAGNOSIS — G20 Parkinson's disease: Secondary | ICD-10-CM | POA: Diagnosis not present

## 2021-10-15 DIAGNOSIS — G20 Parkinson's disease: Secondary | ICD-10-CM | POA: Diagnosis not present

## 2021-10-15 DIAGNOSIS — R2681 Unsteadiness on feet: Secondary | ICD-10-CM | POA: Diagnosis not present

## 2021-10-16 ENCOUNTER — Telehealth: Payer: PPO

## 2021-10-19 ENCOUNTER — Other Ambulatory Visit: Payer: Self-pay | Admitting: Cardiology

## 2021-10-19 NOTE — Telephone Encounter (Signed)
Prescription refill request for Eliquis received. Indication:Afib Last office visit:3/22 Scr:1.0 Age: 81 Weight:76.9 kg  Prescription refilled

## 2021-10-24 NOTE — Progress Notes (Signed)
Assessment/Plan:   1.  Parkinsons Disease  -DaT scan abnormal in the past  -discussed increasing exercising  -refer for PT at [facility]  -We discussed that it used to be thought that levodopa would increase risk of melanoma but now it is believed that Parkinsons itself likely increases risk of melanoma. he is to get regular skin checks.  He does see dermatology  -continue carbidopa/levodopa 25/100 1 tablet three times per day  -add carbidopa/levodopa 50/200 at bed to see if it will help q hs walking when wakes up to use the bathroom  -needs to use trekking poles  -information given to careful with the [place]  -lcsw met with pt/wife (wife hasn't been here before so spent time educating her today as well on disease) 2.  MCI  -Neurocognitive testing with Dr. Milbert Coulter in March, 2021 with evidence of MCI only.  3.  Depression  -Primary care prescribing sertraline, 100 mg daily.  Doing well  Subjective:   Walter Brown was seen today in follow up for Parkinsons disease.  My previous records were reviewed prior to todays visit as well as outside records available to me.  Wife accompanies the patient and supplements the history.  His wife did call us since our last visit that, complaining that she felt that patient was unmotivated and frustrated and having more balance issues after he had a fall.    My staff talked to him and he stated that he felt that his mood was stable, perhaps frustrated but not depressed.  He was given community resources for exercise and counseling.  We also had referred him to physical therapy at [facility] physical therapy.  I did not get any correspondence back from them.  Looks it looks like they saw primary care just a few days after I sent the referral and they sent another referral.  He admits that he has only gone a few times.   Asks me if I think that physical therapy really works.  Wife brings in a log today of times when the patient has felt unbalanced or unsteady.   Wife thinks his levodopa may need increased.  Neither noticed wearing off.  Wife states that he isn't walking well at night when gets up to urinate.  Patient asks about [facility].  They are looking into moving into assisted living at [facility].  Current prescribed movement disorder medications: Carbidopa/levodopa 25/100, 1 tablet 3 times per day   ALLERGIES:   Allergies  Allergen Reactions   Albuterol Sulfate Palpitations   Azithromycin Other (See Comments)    Hepatotoxicity   Doxycycline Hyclate Other (See Comments)    Taken with Azithromycin and had Heaptotoxicity    CURRENT MEDICATIONS:  Outpatient Encounter Medications as of 10/25/2021  Medication Sig   amiodarone (PACERONE) 200 MG tablet Take 200 mg by mouth daily.   apixaban (ELIQUIS) 5 MG TABS tablet TAKE 1 TABLET BY MOUTH TWICE A DAY   carbidopa-levodopa (SINEMET IR) 25-100 MG tablet Take 1 tablet by mouth 3 (three) times daily. 7am/11am/4pm   Coenzyme Q10 100 MG TABS Take 1 tablet by mouth 2 (two) times daily.   hydrochlorothiazide (HYDRODIURIL) 25 MG tablet TAKE 1 TABLET BY MOUTH EVERY DAY   Melatonin 10 MG TABS Take 10 mg by mouth daily.   Multiple Vitamins-Minerals (ICAPS AREDS 2 PO) Take 1 capsule by mouth in the morning and at bedtime.   rosuvastatin (CRESTOR) 5 MG tablet TAKE 1 TABLET BY MOUTH EVERY DAY   sertraline (  ZOLOFT) 100 MG tablet TAKE 1 TABLET BY MOUTH EVERY DAY   terazosin (HYTRIN) 10 MG capsule Take 10 mg by mouth at bedtime.    [DISCONTINUED] hydrochlorothiazide (HYDRODIURIL) 25 MG tablet Take 1 tablet (25 mg total) by mouth daily.   No facility-administered encounter medications on file as of 10/25/2021.    Objective:   PHYSICAL EXAMINATION:    VITALS:   Vitals:   10/25/21 1116  BP: (!) 141/82  Pulse: 68  SpO2: 98%  Weight: 169 lb 9.6 oz (76.9 kg)  Height: $Remove'5\' 4"'[id number]$  (1.626 m)      GEN:  The patient appears stated age and is in NAD. HEENT:  Normocephalic, atraumatic.  The mucous membranes are  moist.  Neurological examination:  Orientation: The patient is alert and oriented x3. Cranial nerves: There is good facial symmetry with facial hypomimia. The speech is fluent and clear. Soft palate rises symmetrically and there is no tongue deviation. Hearing is intact to conversational tone. Sensation: Sensation is intact to light touch throughout Motor: Strength is at least antigravity x4.  Movement examination: Tone: There is good tone in the UE/LE today Abnormal movements: none Coordination:  There is no decremation with any form of RAMS, including alternating supination and pronation of the forearm, hand opening and closing, finger taps, heel taps and toe taps. Gait and Station: The patient has no difficulty arising out of a deep-seated chair without the use of the hands. The patient's stride length is good but he slightly drags the R leg and decreased arm swing on the R.    I have reviewed and interpreted the following labs independently    Chemistry      Component Value Date/Time   NA 141 09/11/2021 1157   NA 142 04/05/2020 0849   K 3.7 09/11/2021 1157   CL 106 09/11/2021 1157   CO2 31 09/11/2021 1157   BUN 19 09/11/2021 1157   BUN 14 04/05/2020 0849   CREATININE 1.01 09/11/2021 1157   CREATININE 1.16 10/20/2020 1426      Component Value Date/Time   CALCIUM 8.6 09/11/2021 1157   ALKPHOS 63 09/11/2021 1157   AST 24 09/11/2021 1157   ALT 7 09/11/2021 1157   BILITOT 0.7 09/11/2021 1157   BILITOT 0.9 04/05/2020 0849       Lab Results  Component Value Date   WBC 6.2 09/11/2021   HGB 13.0 09/11/2021   HCT 38.9 (L) 09/11/2021   MCV 96.2 09/11/2021   PLT 119.0 (L) 09/11/2021    Lab Results  Component Value Date   TSH 1.13 09/11/2021     Total time spent on today's visit was 31 minutes, including both face-to-face time and nonface-to-face time.  Time included that spent on review of records (prior notes available to me/labs/imaging if pertinent), discussing  treatment and goals, answering patient's questions and coordinating care.  Cc:  [name], MD

## 2021-10-25 ENCOUNTER — Other Ambulatory Visit: Payer: Self-pay

## 2021-10-25 ENCOUNTER — Other Ambulatory Visit: Payer: Self-pay | Admitting: Family Medicine

## 2021-10-25 ENCOUNTER — Encounter: Payer: Self-pay | Admitting: Neurology

## 2021-10-25 ENCOUNTER — Ambulatory Visit: Payer: PPO | Admitting: Neurology

## 2021-10-25 VITALS — BP 141/82 | HR 68 | Ht 64.0 in | Wt 169.6 lb

## 2021-10-25 DIAGNOSIS — G20 Parkinson's disease: Secondary | ICD-10-CM

## 2021-10-25 MED ORDER — CARBIDOPA-LEVODOPA ER 50-200 MG PO TBCR: 1.0000 | EXTENDED_RELEASE_TABLET | Freq: Every day | ORAL | 1 refills | Status: DC

## 2021-10-25 NOTE — Patient Instructions (Addendum)
Continue carbidopa/levodopa 25/100, 1 tablet at 7am/11am/4pm ADD carbidopa/levodopa 50/200 at bedtime I sent a referral to PT at [facility] I want you to use ski poles or trekking poles to walk I do recommend the company [employer] with the [place] to help you downsize  The physicians and staff at [facility] are committed to providing excellent care. You may receive a survey requesting feedback about your experience at our office. We strive to receive "very good" responses to the survey questions. If you feel that your experience would prevent you from giving the office a "very good " response, please contact our office to try to remedy the situation. We may be reached at [phone]. Thank you for taking the time out of your busy day to complete the survey.

## 2021-10-26 ENCOUNTER — Encounter: Payer: Self-pay | Admitting: *Deleted

## 2021-10-26 ENCOUNTER — Ambulatory Visit: Payer: PPO | Admitting: Physician Assistant

## 2021-10-26 VITALS — BP 112/60 | HR 66 | Ht 65.0 in | Wt 171.1 lb

## 2021-10-26 DIAGNOSIS — R1314 Dysphagia, pharyngoesophageal phase: Secondary | ICD-10-CM | POA: Diagnosis not present

## 2021-10-26 DIAGNOSIS — H6121 Impacted cerumen, right ear: Secondary | ICD-10-CM | POA: Diagnosis not present

## 2021-10-26 NOTE — Progress Notes (Signed)
Chief Complaint: Dysphagia  HPI:    Walter Brown is an 81 year old male with a past medical history as listed below including Parkinson's, known to Dr. [name] for previously elevated LFTs (not seen in clinic since 2019), who was referred to me by [name], MD for a complaint of dysphagia.    Today, the patient presents to clinic and explains that he has had trouble swallowing over the past 3 to 4 months.  Really he has only noticed this with his pills telling me that he typically would take 4-5 at a time but these all seem to get stuck in his throat so now he takes them 1 at a time and he still feels like his capsules do not make it down to his stomach.  He is eating and drinking fine.  Denies any throat pain or overt heartburn or reflux symptoms.    Also discusses that his right ear feels filled up and wonders if this is related to his throat symptoms.  He recalls previous episodes of cerumen impaction and having to go to the ER to have this removed.  He does wear hearing aids and sleep on that side.    Tells me he was diagnosed with Parkinson's about 18 months ago.  He is having trouble with his balance.    Denies fever, chills, weight loss, blood in his stool or symptoms that awaken him from sleep.  Past Medical History:  Diagnosis Date   Anemia    mild   Arthritis 04/06/2017   Asthma    childhood   ASVD (arteriosclerotic vascular disease) 09/07/2018   Basal cell carcinoma    skin- on nose- basal cell (20 yrs ago) forehead 1 year ago   BPH (benign prostatic hyperplasia)    Bradycardia 06/28/2018   Cerumen impaction 08/10/2012   Chicken pox as child   Coronary artery calcification 11/23/2019   Elevated LFTs    [place] measles as a child   Hyperlipidemia    Hypertension    Internal hemorrhoids    Irregular cardiac rhythm 03/10/2018   Macular degeneration    Major depressive disorder 08/01/2012   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/27/2019    Obstructive sleep apnea 11/09/2018   Patient reports mild symptoms; was not prescribed a CPAP machine   Otitis externa 08/10/2012   PAF (paroxysmal atrial fibrillation)    Parkinson's disease 05/19/2019   Thrombocytopenia    TIA (transient ischemic attack) 03/10/2018    Past Surgical History:  Procedure Laterality Date   BELPHAROPTOSIS REPAIR     very young, b/l   EXCISIONAL HEMORRHOIDECTOMY     HYDROCELE EXCISION / REPAIR  2012   b/l   SKIN CANCER EXCISION     nose and forehead, basal cell CA    Current Outpatient Medications  Medication Sig Dispense Refill   amiodarone (PACERONE) 200 MG tablet Take 200 mg by mouth daily.     apixaban (ELIQUIS) 5 MG TABS tablet TAKE 1 TABLET BY MOUTH TWICE A DAY 180 tablet 1   carbidopa-levodopa (SINEMET CR) 50-200 MG tablet Take 1 tablet by mouth at bedtime. 90 tablet 1   carbidopa-levodopa (SINEMET IR) 25-100 MG tablet Take 1 tablet by mouth 3 (three) times daily. 7am/11am/4pm 270 tablet 1   Coenzyme Q10 100 MG TABS Take 1 tablet by mouth 2 (two) times daily.     hydrochlorothiazide (HYDRODIURIL) 25 MG tablet TAKE 1 TABLET BY MOUTH EVERY DAY 90 tablet 1   Melatonin 10  MG TABS Take 10 mg by mouth daily.     Multiple Vitamins-Minerals (ICAPS AREDS 2 PO) Take 1 capsule by mouth in the morning and at bedtime.     rosuvastatin (CRESTOR) 5 MG tablet TAKE 1 TABLET BY MOUTH EVERY DAY 90 tablet 1   sertraline (ZOLOFT) 100 MG tablet TAKE 1 TABLET BY MOUTH EVERY DAY 90 tablet 1   terazosin (HYTRIN) 10 MG capsule Take 10 mg by mouth at bedtime.      No current facility-administered medications for this visit.    Allergies as of 10/26/2021 - Review Complete 10/26/2021  Allergen Reaction Noted   Albuterol sulfate Palpitations 08/04/2018   Azithromycin Other (See Comments) 12/08/2017   Doxycycline hyclate Other (See Comments) 12/09/2017    Family History  Problem Relation Age of Onset   Hypertension Mother    Other Mother        MRSA   Heart  disease Mother        stents   Heart disease Father    Hypertension Father    Prostate cancer Father 33   Parkinson's disease Father    Prostate cancer Maternal Grandfather    ADD / ADHD Son        ADHD   Other Son 24       part of 1 lung removed- due to infection   Stroke Maternal Grandmother    Cancer Paternal Grandfather        EYE    Social History   Socioeconomic History   Marital status: Married    Spouse name: Not on file   Number of children: 2   Years of education: 16   Highest education level: Bachelor's degree (e.g., BA, AB, BS)  Occupational History   Occupation: retired  Tobacco Use   Smoking status: Former    Packs/day: 1.50    Years: 15.00    Pack years: 22.50    Types: Cigarettes    Start date: 10/07/1978   Smokeless tobacco: Never  Vaping Use   Vaping Use: Never used  Substance and Sexual Activity   Alcohol use: Yes    Alcohol/week: 7.0 - 14.0 standard drinks    Types: 7 - 14 Glasses of wine per week    Comment: 1-2 glasses of wine with dinner   Drug use: No   Sexual activity: Yes    Comment: lives with wife, 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  Other Topics Concern   Not on file  Social History Narrative   Right handed    Social Determinants of Health   Financial Resource Strain: Low Risk    Difficulty of Paying Living Expenses: Not very hard  Food Insecurity: No Food Insecurity   Worried About Running Out of Food in the Last Year: Never true   Ran Out of Food in the Last Year: Never true  Transportation Needs: No Transportation Needs   Lack of Transportation (Medical): No   Lack of Transportation (Non-Medical): No  Physical Activity: Sufficiently Active   Days of Exercise per Week: 5 days   Minutes of Exercise per Session: 30 min  Stress: No Stress Concern Present   Feeling of Stress : Not at all  Social Connections: Moderately Integrated   Frequency of Communication with Friends and  Family: More than three times a week   Frequency of Social Gatherings with Friends and Family: Once a week   Attends Religious Services: 1 to 4 times per year  Active Member of Clubs or Organizations: No   Attends Archivist Meetings: Never   Marital Status: Married  Human resources officer Violence: Not At Risk   Fear of Current or Ex-Partner: No   Emotionally Abused: No   Physically Abused: No   Sexually Abused: No    Review of Systems:    Constitutional: No weight loss, fever or chills Skin: No rash Cardiovascular: No chest pain Respiratory: No SOB  Gastrointestinal: See HPI and otherwise negative Genitourinary: No dysuria Neurological: No headache, dizziness or syncope Musculoskeletal: No new muscle or joint pain Hematologic: No bleeding Psychiatric: No history of depression or anxiety   Physical Exam:  Vital signs: BP 112/60    Pulse 66    Ht 5\' 5"  (1.651 m)    Wt 171 lb 2 oz (77.6 kg)    BMI 28.48 kg/m   Constitutional:   Pleasant Elderly Caucasian male appears to be in NAD, Well developed, Well nourished, alert and cooperative Head:  Normocephalic and atraumatic. Eyes:   PEERL, EOMI. No icterus. Conjunctiva pink. Ears:  Normal auditory acuity. Neck:  Supple Throat: Oral cavity and pharynx without inflammation, swelling or lesion.  Respiratory: Respirations even and unlabored. Lungs clear to auscultation bilaterally.   No wheezes, crackles, or rhonchi.  Cardiovascular: Normal S1, S2. No MRG. Regular rate and rhythm. No peripheral edema, cyanosis or pallor.  Gastrointestinal:  Soft, nondistended, nontender. No rebound or guarding. Normal bowel sounds. No appreciable masses or hepatomegaly. Rectal:  Not performed.  Msk:  Symmetrical without gross deformities. Without edema, no deformity or joint abnormality.  Neurologic:  Alert and  oriented x4;  grossly normal neurologically.  Skin:   Dry and intact without significant lesions or rashes. Psychiatric:  Demonstrates  good judgement and reason without abnormal affect or behaviors.  RELEVANT LABS AND IMAGING: CBC    Component Value Date/Time   WBC 6.2 09/11/2021 1157   RBC 4.05 (L) 09/11/2021 1157   HGB 13.0 09/11/2021 1157   HGB 14.1 04/05/2020 0849   HCT 38.9 (L) 09/11/2021 1157   HCT 40.8 04/05/2020 0849   PLT 119.0 (L) 09/11/2021 1157   PLT 118 (L) 04/05/2020 0849   MCV 96.2 09/11/2021 1157   MCV 95 04/05/2020 0849   MCH 32.2 10/20/2020 1426   MCHC 33.5 09/11/2021 1157   RDW 13.8 09/11/2021 1157   RDW 12.7 04/05/2020 0849   LYMPHSABS 1.5 04/05/2020 0849   MONOABS 0.8 08/27/2018 1015   EOSABS 0.1 04/05/2020 0849   BASOSABS 0.0 04/05/2020 0849    CMP     Component Value Date/Time   NA 141 09/11/2021 1157   NA 142 04/05/2020 0849   K 3.7 09/11/2021 1157   CL 106 09/11/2021 1157   CO2 31 09/11/2021 1157   GLUCOSE 75 09/11/2021 1157   BUN 19 09/11/2021 1157   BUN 14 04/05/2020 0849   CREATININE 1.01 09/11/2021 1157   CREATININE 1.16 10/20/2020 1426   CALCIUM 8.6 09/11/2021 1157   PROT 5.9 (L) 09/11/2021 1157   PROT 6.2 04/05/2020 0849   ALBUMIN 4.0 09/11/2021 1157   ALBUMIN 4.1 04/05/2020 0849   AST 24 09/11/2021 1157   ALT 7 09/11/2021 1157   ALKPHOS 63 09/11/2021 1157   BILITOT 0.7 09/11/2021 1157   BILITOT 0.9 04/05/2020 0849   GFRNONAA 59 (L) 04/05/2020 0849   GFRAA 69 04/05/2020 0849    Assessment: 1.  Dysphagia: Worse over the past 3 to 4 months, mainly to pills; consider relation to Parkinson's and  dysmotility versus stricture versus other 2.  Cerumen impaction: Likely causing his fullness ear feeling on the right side  Plan: 1.  Discussed starting a low-dose PPI to see if this helps but patient declines. 2.  Ordered a barium esophagram with tablet for further evaluation.  Discussed that if this shows a stricture we will need to do an EGD.  If not he may need referral to a speech pathologist. 3.  Instructed the patient to go to the urgent care or his PCP for cerumen  disimpaction 4.  Patient will follow in clinic per recommendations after imaging above.  Ellouise Newer, PA-C [facility] 10/26/2021, 2:02 PM  Cc: [name], MD

## 2021-10-26 NOTE — Patient Instructions (Signed)
You have been scheduled for a Barium Esophogram at [facility] (1st floor of the hospital) on 11/01/21 at 11:00 am. Please arrive 15 minutes prior to your appointment for registration. Make certain not to have anything to eat or drink 3 hours prior to your test. If you need to reschedule for any reason, please contact radiology at [phone] to do so. __________________________________________________________________ A barium swallow is an examination that concentrates on views of the esophagus. This tends to be a double contrast exam (barium and two liquids which, when combined, create a gas to distend the wall of the oesophagus) or single contrast (non-ionic iodine based). The study is usually tailored to your symptoms so a good history is essential. Attention is paid during the study to the form, structure and configuration of the esophagus, looking for functional disorders (such as aspiration, dysphagia, achalasia, motility and reflux) EXAMINATION You may be asked to change into a gown, depending on the type of swallow being performed. A radiologist and radiographer will perform the procedure. The radiologist will advise you of the type of contrast selected for your procedure and direct you during the exam. You will be asked to stand, sit or lie in several different positions and to hold a small amount of fluid in your mouth before being asked to swallow while the imaging is performed .In some instances you may be asked to swallow barium coated marshmallows to assess the motility of a solid food bolus. The exam can be recorded as a digital or video fluoroscopy procedure. POST PROCEDURE It will take 1-2 days for the barium to pass through your system. To facilitate this, it is important, unless otherwise directed, to increase your fluids for the next 24-48hrs and to resume your normal diet.  This test typically takes about 30 minutes to  perform. __________________________________________________________________________________  If you are age 81 or older, your body mass index should be between 23-30. Your Body mass index is 28.48 kg/m. If this is out of the aforementioned range listed, please consider follow up with your Primary Care Provider.  If you are age 81 or younger, your body mass index should be between 19-25. Your Body mass index is 28.48 kg/m. If this is out of the aformentioned range listed, please consider follow up with your Primary Care Provider.   ________________________________________________________  The [facility] providers would like to encourage you to use [place] to communicate with providers for non-urgent requests or questions.  Due to long hold times on the telephone, sending your provider a message by [facility] may be a faster and more efficient way to get a response.  Please allow 48 business hours for a response.  Please remember that this is for non-urgent requests.  _______________________________________________________  Due to recent changes in healthcare laws, you may see the results of your imaging and laboratory studies on MyChart before your provider has had a chance to review them.  We understand that in some cases there may be results that are confusing or concerning to you. Not all laboratory results come back in the same time frame and the provider may be waiting for multiple results in order to interpret others.  Please give [place] 48 hours in order for your provider to thoroughly review all the results before contacting the office for clarification of your results.

## 2021-10-29 NOTE — Progress Notes (Signed)
Addendum: Reviewed and agree with assessment and management plan. Geryl Dohn M, MD  

## 2021-10-31 ENCOUNTER — Telehealth: Payer: PPO

## 2021-11-01 ENCOUNTER — Ambulatory Visit (HOSPITAL_COMMUNITY): Payer: PPO

## 2021-11-04 ENCOUNTER — Other Ambulatory Visit: Payer: Self-pay | Admitting: Neurology

## 2021-11-04 ENCOUNTER — Other Ambulatory Visit: Payer: Self-pay | Admitting: Cardiology

## 2021-11-04 DIAGNOSIS — G20 Parkinson's disease: Secondary | ICD-10-CM

## 2021-11-05 ENCOUNTER — Ambulatory Visit (INDEPENDENT_AMBULATORY_CARE_PROVIDER_SITE_OTHER): Payer: PPO | Admitting: Pharmacist

## 2021-11-05 ENCOUNTER — Other Ambulatory Visit: Payer: Self-pay

## 2021-11-05 DIAGNOSIS — G20 Parkinson's disease: Secondary | ICD-10-CM

## 2021-11-05 DIAGNOSIS — R1310 Dysphagia, unspecified: Secondary | ICD-10-CM

## 2021-11-05 DIAGNOSIS — E782 Mixed hyperlipidemia: Secondary | ICD-10-CM

## 2021-11-05 DIAGNOSIS — H6121 Impacted cerumen, right ear: Secondary | ICD-10-CM | POA: Diagnosis not present

## 2021-11-05 DIAGNOSIS — I10 Essential (primary) hypertension: Secondary | ICD-10-CM

## 2021-11-05 NOTE — Patient Instructions (Signed)
Walter Brown, It was a pleasure speaking with you today.  I have attached a summary of our visit today and information about your health goals.   Patient Goals/Self-Care Activities Over the next 90 days, patient will:   take medications as prescribed try looking up to swallow tablets and looking down to swallow capsules focus on medication adherence  Continue with plan to have Swallowing test - please contact the clinical pharmacist if you continue to have difficulty swallowing. We can evaluate for transition of some medications to liquid form if available.  Our next check in by telephone - will check back after swallowing test in 1 to 2 weeks.    If you have any questions or concerns, please feel free to contact me either at the phone number below or with a MyChart message.    [name], PharmD Clinical Pharmacist [facility] [phone] (direct line)  [phone] (main office number)  CARE PLAN ENTRY  Hypertension BP Readings from Last 3 Encounters:  10/26/21 112/60  10/25/21 (!) 141/82  09/11/21 130/70   Pharmacist Clinical Goal(s): Over the next 90 days, patient will work with PharmD and providers to maintain blood pressure  goal <130/80 Current regimen:  Hydrochlorothiazide 25mg  daily in morning Interventions: Discussed blood pressure goal Requested patient record blood pressure readings  Patient self care activities - Over the next 90 days, patient will: Check blood pressure  1 to 2 times per week, document, and provide at future appointments Ensure daily salt intake < 2300 mg/day  Hyperlipidemia Lab Results  Component Value Date/Time   LDLCALC 38 09/11/2021 11:57 AM   LDLCALC 36 10/20/2020 02:26 PM   Pharmacist Clinical Goal(s): Over the next 90 days, patient will work with PharmD and providers to maintain LDL goal < 70 Current regimen:  Rosuvastatin 5mg  daily CoEnzyme Q10 100mg  daily Interventions: Discussed benefits  of statin for risk reduction for cardiovascular disease Recommended to continue current medication  Parkinson's Disease (goal: decrease symptoms of PD - weakness, slightly tremor): Pharmacist Clinical Goal(s) Over the next 90 days, patient will work with PharmD and providers to reduce symptoms of Parkinson's Disease Current regimen:  Carbidopa / Levodopa 25/100mg  3 times a day (7am, 11am and 4pm) Carbidopa / Levodopa 50/200mg  ER once a day at bedtime Education provided to take small sip of water with each tablet / capsule. And also to try looking up to swallow tablets and looking down to swallow capsules.  Depending on Barium swallow test - patient might need either additional testing with EGD and esophageal stretching or referral to speech therapy. Patient self care activities - Over the next 90 days, patient will:  Continue current medication Continue with plan to start physical therapy with PT specializing in neurological conditions.    Atrial fibrillation Pharmacist Clinical Goal(s) Over the next 90 days, patient will work with PharmD and providers to reduce risk of stroke due to Afib Current regimen:  Amiodarone 200mg  daily Eliquis 5mg  twice daily Interventions: Discussed how Eliquis prevents strokes in comparison to statins Patient self care activities - Over the next 90 days, patient will: Maintain current medication regimen  Depression Pharmacist Clinical Goal(s) Over the next 90 days, patient will work with PharmD and providers to reduce symptoms associated with depression Current regimen:  Sertraline 100mg  daily Patient self care activities - Over the next 90 days, patient will: Continue sertraline  Continue with plan to start physical therapy with PT specializing in neurological conditions.   Medication management Pharmacist  Clinical Goal(s): Over the next 90 days, patient will work with PharmD and providers to achieve optimal medication adherence [facility] Interventions Comprehensive medication review performed. Continue current medication management strategy Patient self care activities - Over the next 90 days, patient will: Focus on medication adherence by filling and taking medications appropriately  Take medications as prescribed Report any questions or concerns to PharmD and/or provider(s)     Patient verbalizes understanding of instructions and care plan provided today and agrees to view in [place]. Active MyChart status confirmed with patient.

## 2021-11-05 NOTE — Chronic Care Management (AMB) (Signed)
Chronic Care Management Pharmacy Note  11/05/2021 Name:  UNNAMED ZEIEN MRN:  [id number] DOB:  1941/03/27  Subjective: Walter Brown is an 81 y.o. year old male who is a primary patient of [name], MD.  The CCM team was consulted for assistance with disease management and care coordination needs.    Engaged with patient by telephone for follow up visit in response to provider referral for pharmacy case management and/or care coordination services.   Consent to Services:  The patient was given information about Chronic Care Management services, agreed to services, and gave verbal consent prior to initiation of services.  Please see initial visit note for detailed documentation.   Patient Care Team: [name], MD as PCP - General (Family Medicine) Renda Rolls, Jennefer Bravo, MD as Referring Physician (Dermatology) Luberta Mutter, MD as Consulting Physician (Ophthalmology) Ardis Hughs, MD as Consulting Physician (Urology) Tat, Eustace Quail, DO as Consulting Physician (Neurology)  Recent office visits: 09/11/2021 - Fam Med (Dr Charlett Blake) Seen for unsteady gait. No medication changes.  05/29/2021 - PCP (Dr Charlett Blake) F/U chronic conditions. No medication changes noted 02/06/2021 - PCP (Dr Charlett Blake) video visit to f/u chronic medical conditions.  Per patient was instructed to lower dose of rosuvastatin from 43m to 557mdaily to see if this helped with muscle weakness.   Recent consult visits: 10/26/2021 - GI (LFabio AsaPA) Seen for dysphagia. Discussed starting low dose PPI but patient declined. Ordered barium esophagram with tablet.  10/25/2021 - Neuro (Dr Tat) f/U parkinson's disease. Added carbidopa/levodopa 50/20076mt bedtime. Continue carbidopa / levodopa 20/100m73mtimes a day.  07/30/2021 - Dermatology (Dr HaveRenda Rollsen for melanin hyperpigmentation. Lesions removed 09/12/2021 - Ophthalmology (Dr HainIona Hansenen for macular degeneration - received bevacizumab injection.   07/23/2021 - Urology (Dr HerrLouis Meckelen for BPH / poor urinary stream.  Prostate exam normal. No med changes. F/U 1 year.  Hospital visits: None in previous 6 months  Objective:  Lab Results  Component Value Date   CREATININE 1.01 09/11/2021   CREATININE 1.08 05/21/2021   CREATININE 1.16 10/20/2020    Lab Results  Component Value Date   HGBA1C 5.2 09/11/2021   Last diabetic Eye exam: No results found for: HMDIABEYEEXA  Last diabetic Foot exam: No results found for: HMDIABFOOTEX      Component Value Date/Time   CHOL 121 09/11/2021 1157   CHOL 132 04/05/2020 0849   TRIG 62.0 09/11/2021 1157   HDL 70.50 09/11/2021 1157   HDL 72 04/05/2020 0849   CHOLHDL 2 09/11/2021 1157   VLDL 12.4 09/11/2021 1157   LDLCALC 38 09/11/2021 1157   LDLCALC 36 10/20/2020 1426    Hepatic Function Latest Ref Rng & Units 09/11/2021 05/21/2021 10/20/2020  Total Protein 6.0 - 8.3 g/dL 5.9(L) 6.2 6.0(L)  Albumin 3.5 - 5.2 g/dL 4.0 4.2 -  AST 0 - 37 U/L _0 ALT 0 - 53 U/L _1 Alk Phosphatase 39 - 117 U/L 63 60 -  Total Bilirubin 0.2 - 1.2 mg/dL 0.7 0.8 0.6  Bilirubin, Direct 0.00 - 0.40 mg/dL - - -    Lab Results  Component Value Date/Time   TSH 1.13 09/11/2021 11:57 AM   TSH 1.13 05/21/2021 09:42 AM    CBC Latest Ref Rng & Units 09/11/2021 05/21/2021 10/20/2020  WBC 4.0 - 10.5 K/uL 6.2 6.0 6.8  Hemoglobin 13.0 - 17.0 g/dL 13.0 13.6 14.0  Hematocrit 39.0 - 52.0 % 38.9(L) 39.8 40.6  Platelets 150.0 - 400.0 K/uL 119.0(L) 120.0(L) 128(L)    No results found for: VD25OH  Clinical ASCVD: Yes  The ASCVD Risk score (Arnett DK, et al., 2019) failed to calculate for the following reasons:   The 2019 ASCVD risk score is only valid for ages 80 to 43   The patient has a prior MI or stroke diagnosis    Other: CHADS2VASc = 3  Social History   Tobacco Use  Smoking Status Former   Packs/day: 1.50   Years: 15.00   Pack years: 22.50   Types: Cigarettes   Start date: 10/07/1978  Smokeless  Tobacco Never   BP Readings from Last 3 Encounters:  10/26/21 112/60  10/25/21 (!) 141/82  09/11/21 130/70   Pulse Readings from Last 3 Encounters:  10/26/21 66  10/25/21 68  09/11/21 (!) 58   Wt Readings from Last 3 Encounters:  10/26/21 171 lb 2 oz (77.6 kg)  10/25/21 169 lb 9.6 oz (76.9 kg)  09/11/21 169 lb 9.6 oz (76.9 kg)    Assessment: Review of patient past medical history, allergies, medications, health status, including review of consultants reports, laboratory and other test data, was performed as part of comprehensive evaluation and provision of chronic care management services.   SDOH:  (Social Determinants of Health) assessments and interventions performed:  SDOH Interventions    Flowsheet Row Most Recent Value  SDOH Interventions   Financial Strain Interventions Intervention Not Indicated  Physical Activity Interventions Other (Comments)  [patient will have evaluation 11/06/2021 by physical therapy in relation to parkinson's disease and fall prevention]  Transportation Interventions Intervention Not Indicated       CCM Care Plan  Allergies  Allergen Reactions   Albuterol Sulfate Palpitations   Azithromycin Other (See Comments)    Hepatotoxicity   Doxycycline Hyclate Other (See Comments)    Taken with Azithromycin and had Heaptotoxicity    Medications Reviewed Today     Reviewed by [name], RPH-CPP (Pharmacist) on 11/05/21 at [phone]  Med List Status: <None>   Medication Order Taking? Sig Documenting Provider Last Dose Status Informant  amiodarone (PACERONE) 200 MG tablet [id number] Yes Take 200 mg by mouth daily. [provider] Taking Active   apixaban (ELIQUIS) 5 MG TABS tablet [id number] Yes TAKE 1 TABLET BY MOUTH TWICE A DAY Revankar, Reita Cliche, MD Taking Active   carbidopa-levodopa (SINEMET CR) 50-200 MG tablet [id number] Yes Take 1 tablet by mouth at bedtime. Ludwig Clarks, DO Taking Active   carbidopa-levodopa (SINEMET IR) 25-100 MG  tablet [id number] Yes Take 1 tablet by mouth 3 (three) times daily. 7am/11am/4pm Tat, Eustace Quail, DO Taking Active   Coenzyme Q10 100 MG TABS [id number] Yes Take 1 tablet by mouth 2 (two) times daily. [provider] Taking Active   hydrochlorothiazide (HYDRODIURIL) 25 MG tablet [id number] Yes TAKE 1 TABLET BY MOUTH EVERY DAY [name], MD Taking Active   Melatonin 10 MG TABS [id number] Yes Take 10 mg by mouth daily as needed. [provider] Taking Active   Multiple Vitamins-Minerals (ICAPS AREDS 2 PO) [id number] Yes Take 1 capsule by mouth in the morning and at bedtime. [provider] Taking Active   rosuvastatin (CRESTOR) 5 MG tablet [id number] Yes TAKE 1 TABLET BY MOUTH EVERY DAY [name], MD Taking Active   sertraline (ZOLOFT) 100 MG tablet [id number] Yes TAKE 1 TABLET BY MOUTH EVERY DAY [name], MD Taking Active   terazosin (HYTRIN) 10 MG capsule [id number] Yes Take 10 mg  by mouth at bedtime.  [provider] Taking Active             Patient Active Problem List   Diagnosis Date Noted   Dysphagia 09/11/2021   Unsteady gait 09/11/2021   Insomnia 02/06/2021   Depression, recurrent ([place]) 04/25/2020   Hyperglycemia 04/25/2020   Mild neurocognitive disorder due to Parkinson's disease ([place]) 12/27/2019   Coronary artery calcification 11/23/2019   Parkinson's disease ([place]) 05/19/2019   Obstructive sleep apnea 11/09/2018   PAF (paroxysmal atrial fibrillation) ([place]) 09/09/2018   Palpitations 09/07/2018   ASVD (arteriosclerotic vascular disease) 09/07/2018   Lipoma 08/04/2018   Bradycardia 06/28/2018   Irregular cardiac rhythm 03/10/2018   TIA (transient ischemic attack) 03/10/2018   Grade II hemorrhoids 09/16/2017   Arthritis 04/06/2017   Macular degeneration 03/03/2015   Right shoulder pain 09/26/2014   Back pain with radiation 04/26/2013   Otitis externa 08/10/2012   Preventative health care 08/01/2012   Anemia    Asthma     Hyperlipidemia    Hypertension    BPH (benign prostatic hyperplasia)    Cancer ([place])    Thrombocytopenia ([place])     Immunization History  Administered Date(s) Administered   Influenza Split 07/02/2012   Influenza Whole 06/07/2013   Influenza, High Dose Seasonal PF 07/08/2017, 05/19/2019, 06/22/2020, 06/18/2021   Influenza,inj,Quad PF,6+ Mos 06/21/2014   Influenza-Unspecified 06/07/2016, 07/08/2017, 05/26/2018   PFIZER Comirnaty(Gray Top)Covid-19 Tri-Sucrose Vaccine 01/09/2021   PFIZER(Purple Top)SARS-COV-2 Vaccination 10/20/2019, 11/10/2019, 06/29/2020   Pfizer Covid-19 Vaccine Bivalent Booster 60yr & up 06/18/2021   Pneumococcal Conjugate-13 11/04/2013   Pneumococcal Polysaccharide-23 10/08/2007, 08/04/2020   Tdap 11/04/2013   Zoster Recombinat (Shingrix) 05/26/2018, 11/05/2018   Zoster, Live 10/07/1998    Conditions to be addressed/monitored: Atrial Fibrillation, HTN, HLD, Depression and Guardianship  Care Plan : General Pharmacy (Adult)  Updates made by ECherre Robins RPH-CPP since 11/05/2021 12:00 AM     Problem: Hypertension, Hyperlipidemia/TIA, AFib, Depression, BPH, Parkinson's Disease   Priority: High  Onset Date: 11/29/2020     Long-Range Goal: Patient-Specific Goal   Start Date: 11/29/2020  Expected End Date: 05/29/2021  Recent Progress: On track  Priority: High  Note:   Current Barriers:  Difficulty swallowing tablets  Pharmacist Clinical Goal(s):  Over the next 90 days, patient will achieve adherence to monitoring guidelines and medication adherence to achieve therapeutic efficacy Improve depression symptoms adhere to prescribed medication regimen as evidenced by fill dates Update prescriptions to match current dose. contact provider office for questions/concerns as evidenced notation of same in electronic health record through collaboration with PharmD and provider.   Interventions: 1:1 collaboration with BMosie Lukes MD regarding development and  update of comprehensive plan of care as evidenced by provider attestation and co-signature Inter-disciplinary care team collaboration (see longitudinal plan of care) Comprehensive medication review performed; medication list updated in electronic medical record  Hypertension (BP goal <130/80) Controlled; Last 3 office BP have been at goal Home blood pressure reported 130 to 140/70's Current treatment: Hydrochlorothiazide 224mdaily in morning Medications previously tried: none noted Current exercise habits: exercise has decreased recently; will meet with physical therapy 11/06/2021 Denies hypotensive/hypertensive symptoms Interventions:  Reviewed blood pressure goals and benefits of medications for prevention of heart attack, stroke and kidney damage; Encouraged to continue to check blood pressure weekly Recommended to continue current medication  Hyperlipidemia: (LDL goal < 70) Controlled  Was having weakness thought to have been related to statin. Rosuvastatin dose lowered from 1025maily to 5mg19mily 02/06/2021 Patient  reports improvement in muscle weakness Current treatment: Rosuvastatin 71m daily CoEnzyme Q10 1044mdaily  Medications previously tried: simvastatin, rosuvastatin 1032mmuscle weakness) Interventions: Reviewed last lipid panel and discuss goals Discussed benefits of statin for ASCVD risk reduction; Recommended to continue current medication and exercise regimen   Atrial Fibrillation (Goal: prevent stroke and major bleeding) Controlled  Current treatment: Rate control: Amiodarone 200m44mily  Anticoagulation: Eliquis 5mg 45mce daily  Medications previously tried: none noted CHA2DS2-VASc Score = 3    Interventions:  Patient is knowledgeable from previous visit about increased risk of stroke due to atrial fibrillation and voices understanding of benefits of anticoagulation for stroke prevention; Assessed patient finances. Currently no issues with copay per patient.  He is to contact clinical pharmacist if reaches coverage gap Recommend continue current medications.  Depression/Anxiety (Goal: Minimize symptoms) Improved Patient is having difficulty with changes in physical function surrounding Parkinson's Disease. He is doing physical therapy and support groups. Wife met with LCSW in neurologist office for care giver counseling in January 2023.  Current treatment: Sertraline 100mg 47my Medications previously tried/failed: escitalopram Interventions:  Educated on Benefits of medication for symptom control   Encouraged increase social interaction and exercise. [facility].  Parkinson's Disease (goal: decrease symptoms of PD - weakness, slight tremor): Improved night time symptoms since carbidopa/Levodopa 50/200mg a58m at night Patient does report difficulty swallowing medications.  Had GI evaluation 10/2021 and Barium Swallowing study with capsule if planned for 11/08/2021. Patient did have a fall 08/2021  Monitor by Dr Tat Currently therapy:  Carbidopa / levodopa 25/100mg 3 39ms daily (7am/11am/4pm) Carbidopa / levodopa 50/200mg tak64mtablet at bedtime Interventions:  Education provided to take small sip of water with each tablet / capsule. And also to try looking up to swallow tablets and looking down to swallow capsules.  Depending on Barium swallow test - patient might need either additional testing with EGD and esophageal stretching or referral to speech therapy. Recommended he contact Dr Tat to make follow up regarding morning tremor / make need higher dose of carbidopa / levodopa or long acting formula (addressed at previous appointment - long acting formula added by Dr Tat 10/2021) Continue with plan for physical therapy  Patient Goals/Self-Care Activities Over the next 90 days, patient will:   take medications as prescribed try looking up to swallow tablets and looking down to swallow  capsules focus on medication adherence  Continue with plan to have Swallowing test - please contact the clinical pharmacist if you continue to have difficulty swallowing. We can evaluate for transition of some medications to liquid form if available.   Follow Up Plan: The care management team will reach out to the patient again over the next 90 days.        Medication Assistance: None required.  Patient affirms current coverage meets needs.    Patient's preferred pharmacy is:  [id number] [id number] [place] [phone]:  Patient agrees to Care Plan and Follow-up.  Plan: The care management team will reach out to the patient again over the next 90 days.  Darthula Desa EckaCherre Robinslinical Pharmacist [place] [phone]

## 2021-11-06 ENCOUNTER — Ambulatory Visit: Payer: PPO | Admitting: Physical Therapy

## 2021-11-06 DIAGNOSIS — I10 Essential (primary) hypertension: Secondary | ICD-10-CM

## 2021-11-06 DIAGNOSIS — E782 Mixed hyperlipidemia: Secondary | ICD-10-CM | POA: Diagnosis not present

## 2021-11-08 ENCOUNTER — Encounter (HOSPITAL_COMMUNITY): Payer: Self-pay

## 2021-11-08 ENCOUNTER — Ambulatory Visit: Payer: PPO | Admitting: Neurology

## 2021-11-08 ENCOUNTER — Other Ambulatory Visit (HOSPITAL_COMMUNITY): Payer: PPO

## 2021-11-18 ENCOUNTER — Emergency Department (HOSPITAL_BASED_OUTPATIENT_CLINIC_OR_DEPARTMENT_OTHER)
Admission: EM | Admit: 2021-11-18 | Discharge: 2021-11-18 | Disposition: A | Discharge status: Home / Self Care | Payer: PPO | Attending: Emergency Medicine | Admitting: Emergency Medicine

## 2021-11-18 ENCOUNTER — Encounter (HOSPITAL_BASED_OUTPATIENT_CLINIC_OR_DEPARTMENT_OTHER): Payer: Self-pay | Admitting: *Deleted

## 2021-11-18 ENCOUNTER — Emergency Department (HOSPITAL_BASED_OUTPATIENT_CLINIC_OR_DEPARTMENT_OTHER): Payer: PPO

## 2021-11-18 ENCOUNTER — Other Ambulatory Visit: Payer: Self-pay

## 2021-11-18 DIAGNOSIS — J45909 Unspecified asthma, uncomplicated: Secondary | ICD-10-CM | POA: Insufficient documentation

## 2021-11-18 DIAGNOSIS — I10 Essential (primary) hypertension: Secondary | ICD-10-CM | POA: Insufficient documentation

## 2021-11-18 DIAGNOSIS — M7989 Other specified soft tissue disorders: Secondary | ICD-10-CM | POA: Diagnosis not present

## 2021-11-18 DIAGNOSIS — M19042 Primary osteoarthritis, left hand: Secondary | ICD-10-CM | POA: Insufficient documentation

## 2021-11-18 DIAGNOSIS — I480 Paroxysmal atrial fibrillation: Secondary | ICD-10-CM | POA: Diagnosis not present

## 2021-11-18 DIAGNOSIS — G20 Parkinson's disease: Secondary | ICD-10-CM | POA: Insufficient documentation

## 2021-11-18 DIAGNOSIS — Z79899 Other long term (current) drug therapy: Secondary | ICD-10-CM | POA: Insufficient documentation

## 2021-11-18 DIAGNOSIS — M79642 Pain in left hand: Secondary | ICD-10-CM | POA: Diagnosis present

## 2021-11-18 DIAGNOSIS — Z7901 Long term (current) use of anticoagulants: Secondary | ICD-10-CM | POA: Insufficient documentation

## 2021-11-18 MED ORDER — DICLOFENAC SODIUM 1 % EX GEL: 2.0000 g | Freq: Three times a day (TID) | CUTANEOUS | 0 refills | Status: DC | PRN

## 2021-11-18 MED ORDER — METHYLPREDNISOLONE 4 MG PO TBPK: ORAL_TABLET | ORAL | 0 refills | Status: DC

## 2021-11-18 MED ORDER — ACETAMINOPHEN 500 MG PO TABS: 500.0000 mg | ORAL_TABLET | Freq: Four times a day (QID) | ORAL | 0 refills | Status: DC | PRN

## 2021-11-18 NOTE — ED Notes (Signed)
ED Provider at bedside. 

## 2021-11-18 NOTE — ED Triage Notes (Signed)
Pt reports he was helping family move yesterday. Since last night he c/o pain and swelling in left hand. Denies known injury

## 2021-11-18 NOTE — ED Provider Notes (Signed)
[facility] DEPARTMENT Provider Note   CSN: [id number] Arrival date & time: 11/18/21  1255     History  Chief Complaint  Patient presents with   Hand Pain    Walter Brown is a 81 y.o. male.   Hand Pain Patient is an 80 year old male with past medical history significant for asthma, anemia, HLD, HTN, BPH, PAF, Parkinson's Patient presented emergency room today after developing left hand pain yesterday evening he states that he did help his brother move some heavy objects/furniture earlier in the day yesterday.  States that he does not have any sudden onset of pain but developed pain over the course of the day.  States that it feels achy and he noted some swelling.  He has not any fevers shortness breath cough congestion lightheadedness dizziness.  Denies any other areas of joint pain.  No other associate symptoms    Home Medications Prior to Admission medications   Medication Sig Start Date End Date Taking? Authorizing Provider  acetaminophen (TYLENOL) 500 MG tablet Take 1-2 tablets (500-1,000 mg total) by mouth every 6 (six) hours as needed. 11/18/21  Yes Montrelle Eddings S, PA  carbidopa-levodopa (SINEMET IR) 25-100 MG tablet TAKE 1 TABLET BY MOUTH 3 (THREE) TIMES DAILY. 7AM/11AM/4PM 11/05/21   Tat, Eustace Quail, DO  diclofenac Sodium (VOLTAREN) 1 % GEL Apply 2 g topically 3 (three) times daily as needed. 11/18/21  Yes Taris Galindo, Ova Freshwater S, PA  amiodarone (PACERONE) 200 MG tablet Take 200 mg by mouth daily.    [provider]  apixaban (ELIQUIS) 5 MG TABS tablet TAKE 1 TABLET BY MOUTH TWICE A DAY 10/19/21   Revankar, Reita Cliche, MD  carbidopa-levodopa (SINEMET CR) 50-200 MG tablet Take 1 tablet by mouth at bedtime. 10/25/21   Tat, Eustace Quail, DO  Coenzyme Q10 100 MG TABS Take 1 tablet by mouth 2 (two) times daily.    [provider]  hydrochlorothiazide (HYDRODIURIL) 25 MG tablet TAKE 1 TABLET BY MOUTH EVERY DAY 10/25/21   [name], MD  Melatonin 10 MG  TABS Take 10 mg by mouth daily as needed.    [provider]  Multiple Vitamins-Minerals (ICAPS AREDS 2 PO) Take 1 capsule by mouth in the morning and at bedtime.    [provider]  rosuvastatin (CRESTOR) 5 MG tablet TAKE 1 TABLET BY MOUTH EVERY DAY 09/05/21   [name], MD  sertraline (ZOLOFT) 100 MG tablet TAKE 1 TABLET BY MOUTH EVERY DAY 09/05/21   [name], MD  terazosin (HYTRIN) 10 MG capsule Take 10 mg by mouth at bedtime.     [provider]      Allergies    Albuterol sulfate, Azithromycin, and Doxycycline hyclate    Review of Systems   Review of Systems  Physical Exam Updated Vital Signs BP (!) 125/57 (BP Location: Right Arm)    Pulse 61    Temp 98.1 F (36.7 C) (Oral)    Resp 18    Ht 5\' 5"  (1.651 m)    Wt 73.9 kg    SpO2 100%    BMI 27.12 kg/m  Physical Exam Vitals and nursing note reviewed.  Constitutional:      General: He is not in acute distress.    Appearance: Normal appearance. He is not ill-appearing.  HENT:     Head: Normocephalic and atraumatic.  Eyes:     General: No scleral icterus.       Right eye: No discharge.  Left eye: No discharge.     Conjunctiva/sclera: Conjunctivae normal.  Cardiovascular:     Rate and Rhythm: Normal rate.  Pulmonary:     Effort: Pulmonary effort is normal.     Breath sounds: No stridor.  Musculoskeletal:     Comments: Small amount of swelling diffusely in the left hand compared to right.  No areas of fluctuance redness or cellulitis.  No significant warmth to touch.  Bilateral radial artery pulses 2+ and symmetric sensation intact in all fingertips cap refill less than 2 seconds  Neurological:     Mental Status: He is alert and oriented to person, place, and time. Mental status is at baseline.    ED Results / Procedures / Treatments   Labs (all labs ordered are listed, but only abnormal results are displayed) Labs Reviewed - No data to display  EKG None  Radiology DG Hand  Complete Left  Result Date: 11/18/2021 CLINICAL DATA:  An 81 year old male presents for evaluation of hand swelling. This began yesterday. EXAM: LEFT HAND - COMPLETE 3+ VIEW COMPARISON:  December 25, 2013. FINDINGS: Diffuse swelling about the hand is suggested with more pronounced over the palmar area and about the thumb. No soft tissue gas or radiopaque foreign body. Degenerative changes about the interphalangeal joints particularly the first digit interphalangeal joint and distal interphalangeal joints of index and long finger. No signs of fracture or bony destruction. IMPRESSION: 1. Soft tissue swelling without acute osseous abnormality or radiopaque foreign body. 2. Degenerative changes particularly about the interphalangeal joints of the thumb, index and long finger. Electronically Signed   By: Zetta Bills M.D.   On: 11/18/2021 13:59    Procedures Procedures    Medications Ordered in ED Medications - No data to display  ED Course/ Medical Decision Making/ A&P                           Medical Decision Making Amount and/or Complexity of Data Reviewed Radiology: ordered.   Patient is an 81 year old male with past medical history significant for asthma, anemia, HLD, HTN, BPH, PAF, Parkinson's Patient presented emergency room today after developing left hand pain yesterday evening he states that he did help his brother move some heavy objects/furniture earlier in the day yesterday.  States that he does not have any sudden onset of pain but developed pain over the course of the day.  States that it feels achy and he noted some swelling.  He has not any fevers shortness breath cough congestion lightheadedness dizziness.  Denies any other areas of joint pain.  On physical exam patient is distally neurovascularly intact does have some scant swelling to his left hand diffusely no focal swelling no evidence of abscess or cellulitis.  X-ray obtained which shows significant arthritis in the hand.   May be indicative of rheumatoid arthritis or other arthritis  We will provide patient with Voltaren gel recommend Tylenol and follow-up with hand surgery.  Patient discharged home with follow-up with hand surgery.  Final Clinical Impression(s) / ED Diagnoses Final diagnoses:  Arthritis of left hand    Rx / DC Orders ED Discharge Orders          Ordered    diclofenac Sodium (VOLTAREN) 1 % GEL  3 times daily PRN        11/18/21 1516    acetaminophen (TYLENOL) 500 MG tablet  Every 6 hours PRN        11/18/21 1516  Tedd Sias, [place] 11/18/21 1521    Lennice Sites, DO 11/18/21 1526

## 2021-11-18 NOTE — Discharge Instructions (Addendum)
Please use Tylenol as prescribed.  I also prescribed topical medicine that you can apply to your hand, Voltaren gel.  I possibility information for an orthopedic office that employs hand surgeons.  You can call to make an appointment for follow-up with them.

## 2021-11-20 ENCOUNTER — Telehealth: Payer: Self-pay | Admitting: Cardiovascular Disease

## 2021-11-20 MED ORDER — AMIODARONE HCL 200 MG PO TABS: 200.0000 mg | ORAL_TABLET | Freq: Every day | ORAL | 0 refills | Status: DC

## 2021-11-20 NOTE — Telephone Encounter (Signed)
Pt c/o medication issue:  1. Name of Medication: amiodarone (PACERONE) 200 MG tablet  2. How are you currently taking this medication (dosage and times per day)? Take 200 mg by mouth daily.  3. Are you having a reaction (difficulty breathing--STAT)? No   4. What is your medication issue? Patient needs a new prescription sent to  [id number] - [place], [place] - [address] [place]

## 2021-11-20 NOTE — Telephone Encounter (Signed)
Spoke to patient he stated he needs a refill for Amiodarone.Advised he is due to see Dr.Berry.Follow up appointment scheduled with Dr.Berry 12/11/21 at 9:30 am.Refill sent to pharmacy.

## 2021-11-22 DIAGNOSIS — H35372 Puckering of macula, left eye: Secondary | ICD-10-CM | POA: Diagnosis not present

## 2021-11-22 DIAGNOSIS — H353221 Exudative age-related macular degeneration, left eye, with active choroidal neovascularization: Secondary | ICD-10-CM | POA: Diagnosis not present

## 2021-11-22 DIAGNOSIS — H353112 Nonexudative age-related macular degeneration, right eye, intermediate dry stage: Secondary | ICD-10-CM | POA: Diagnosis not present

## 2021-11-22 DIAGNOSIS — H43813 Vitreous degeneration, bilateral: Secondary | ICD-10-CM | POA: Diagnosis not present

## 2021-11-26 ENCOUNTER — Encounter: Payer: Self-pay | Admitting: Neurology

## 2021-12-11 ENCOUNTER — Ambulatory Visit: Payer: PPO | Admitting: Cardiovascular Disease

## 2021-12-11 ENCOUNTER — Encounter: Payer: Self-pay | Admitting: Cardiovascular Disease

## 2021-12-11 ENCOUNTER — Other Ambulatory Visit: Payer: Self-pay

## 2021-12-11 VITALS — BP 120/74 | HR 69 | Ht 65.0 in | Wt 171.0 lb

## 2021-12-11 DIAGNOSIS — I2584 Coronary atherosclerosis due to calcified coronary lesion: Secondary | ICD-10-CM | POA: Diagnosis not present

## 2021-12-11 DIAGNOSIS — I10 Essential (primary) hypertension: Secondary | ICD-10-CM | POA: Diagnosis not present

## 2021-12-11 DIAGNOSIS — I2510 Atherosclerotic heart disease of native coronary artery without angina pectoris: Secondary | ICD-10-CM

## 2021-12-11 DIAGNOSIS — I480 Paroxysmal atrial fibrillation: Secondary | ICD-10-CM | POA: Diagnosis not present

## 2021-12-11 DIAGNOSIS — E782 Mixed hyperlipidemia: Secondary | ICD-10-CM | POA: Diagnosis not present

## 2021-12-11 MED ORDER — ROSUVASTATIN CALCIUM 5 MG PO TABS: 5.0000 mg | ORAL_TABLET | Freq: Every day | ORAL | 4 refills | Status: DC

## 2021-12-11 NOTE — Assessment & Plan Note (Signed)
Maintaining sinus rhythm on amiodarone and apixaban. ?

## 2021-12-11 NOTE — Assessment & Plan Note (Signed)
Chest CT performed 09/24/2019 showed heavy coronary artery calcification.  He is asymptomatic from a coronary point of view and had a negative exercise Myoview stress test 04/23/2018. ?

## 2021-12-11 NOTE — Patient Instructions (Addendum)
Medication Instructions:  ?Your physician recommends that you continue on your current medications as directed. Please refer to the Current Medication list given to you today. ? ? ?*If you need a refill on your cardiac medications before your next appointment, please call your pharmacy* ? ? ?Follow-Up: ?At [facility], you and your health needs are our priority.  As part of our continuing mission to provide you with exceptional heart care, we have created designated Provider Care Teams.  These Care Teams include your primary Cardiologist (physician) and Advanced Practice Providers (APPs -  Physician Assistants and Nurse Practitioners) who all work together to provide you with the care you need, when you need it. ? ?We recommend signing up for the patient portal called "MyChart".  Sign up information is provided on this After Visit Summary.  MyChart is used to connect with patients for Virtual Visits (Telemedicine).  Patients are able to view lab/test results, encounter notes, upcoming appointments, etc.  Non-urgent messages can be sent to your provider as well.   ?To learn more about what you can do with MyChart, go to [email or web address].   ? ?Your next appointment:   ?12 month(s) ? ?The format for your next appointment:   ?In Person ? ?Provider:   ?Quay Burow, MD ? ? ?Other Instructions ?We will do a statin holiday for 1 months. If symptoms resolve please notify our office. If there is no change in symptoms please resume taking your rosuvastatin as prescribed.  ? ?

## 2021-12-11 NOTE — Assessment & Plan Note (Signed)
History of essential hypertension a blood pressure measured today at 120/74.  He is on hydrochlorothiazide. ?

## 2021-12-11 NOTE — Assessment & Plan Note (Signed)
History of hyperlipidemia on Crestor 5 mg a day with lipid profile performed 09/11/2021 revealing total cholesterol 121, LDL 38 and HDL of 70.  Pain and "some "thigh weakness".  Apparently his mother was statin intolerant as well.  I have asked him to take a 1 month statin holiday and if his thigh symptoms improved significantly we will come up with an alternative to his lipid-lowering therapy. ?

## 2021-12-11 NOTE — Progress Notes (Signed)
? ? ? ?12/11/2021 ?Demetrios Loll   ?April 27, 1941  ?[id number] ? ?Primary Physician [name], MD ?Primary Cardiologist: Lorretta Harp MD Lupe Carney, [place] ? ?HPI:  Walter Brown is a 81 y.o.  thin appearing married Caucasian male father of 2 children (1 is a Marine scientist and other is a Cabin crew), with no grandchildren, who was self-referred to be established in my practice in transfer from Dr. Geraldo Pitter.  His primary care provider is Dr. Randel Pigg.  I last saw him in the office 12/26/2020.  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  He stopped smoking back in 1982.  He has treated hypertension hyperlipidemia.  There is no family history for heart disease.  Never had a heart attack or stroke.  He walks several miles a day with his wife and occasionally notices increased heart rate on his apple watch.  Does have Parkinson's disease on Sinemet.  He is on amiodarone and Eliquis for his PA flutter. ? ?Since I saw him a year ago he continues to do well.  His Parkinson disease clearly is his biggest issue that he complains of.  He did have an episode of what sounds like PAF a year ago but this is fairly rare phenomenon for him.  He denies chest pain or shortness of breath.  He did have a chest CT performed 09/24/2019 that showed heavy coronary calcification but a negative exercise Myoview stress test 04/23/2018. ? ? ?Current Meds  ?Medication Sig  ? acetaminophen (TYLENOL) 500 MG tablet Take 1-2 tablets (500-1,000 mg total) by mouth every 6 (six) hours as needed.  ? amiodarone (PACERONE) 200 MG tablet Take 1 tablet (200 mg total) by mouth daily.  ? apixaban (ELIQUIS) 5 MG TABS tablet TAKE 1 TABLET BY MOUTH TWICE A DAY  ? carbidopa-levodopa (SINEMET IR) 25-100 MG tablet TAKE 1 TABLET BY MOUTH 3 (THREE) TIMES DAILY. 7AM/11AM/4PM  ? Coenzyme Q10 100 MG TABS Take 1 tablet by mouth 2 (two) times daily.  ? diclofenac Sodium (VOLTAREN) 1 % GEL Apply 2 g topically 3 (three)  times daily as needed.  ? hydrochlorothiazide (HYDRODIURIL) 25 MG tablet TAKE 1 TABLET BY MOUTH EVERY DAY  ? Melatonin 10 MG TABS Take 10 mg by mouth daily as needed.  ? methylPREDNISolone (MEDROL DOSEPAK) 4 MG TBPK tablet Follow package insert  ? Multiple Vitamins-Minerals (ICAPS AREDS 2 PO) Take 1 capsule by mouth in the morning and at bedtime.  ? sertraline (ZOLOFT) 100 MG tablet TAKE 1 TABLET BY MOUTH EVERY DAY  ? terazosin (HYTRIN) 10 MG capsule Take 10 mg by mouth at bedtime.   ? [DISCONTINUED] rosuvastatin (CRESTOR) 5 MG tablet TAKE 1 TABLET BY MOUTH EVERY DAY (Patient taking differently: Take 5 mg by mouth daily.)  ?  ? ?Allergies  ?Allergen Reactions  ? Albuterol Sulfate Palpitations  ? Azithromycin Other (See Comments)  ?  Hepatotoxicity  ? Doxycycline Hyclate Other (See Comments)  ?  Taken with Azithromycin and had Heaptotoxicity  ? ? ?Social History  ? ?Socioeconomic History  ? Marital status: Married  ?  Spouse name: Not on file  ? Number of children: 2  ? Years of education: 38  ? Highest education level: Bachelor's degree (e.g., BA, AB, BS)  ?Occupational History  ? Occupation: retired  ?Tobacco Use  ? Smoking status: Former  ?  Packs/day: 1.50  ?  Years: 15.00  ?  Pack years: 22.50  ?  Types: Cigarettes  ?  Start date: 10/07/1978  ? Smokeless tobacco: Never  ?Vaping Use  ? Vaping Use: Never used  ?Substance and Sexual Activity  ? Alcohol use: Yes  ?  Alcohol/week: 7.0 - 14.0 standard drinks  ?  Types: 7 - 14 Glasses of wine per week  ?  Comment: 1-2 glasses of wine with dinner  ? Drug use: No  ? Sexual activity: Yes  ?  Comment: lives with wife, 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  ?Other Topics Concern  ? Not on file  ?Social History Narrative  ? Right handed   ? ?Social Determinants of Health  ? ?Financial Resource Strain: Low Risk   ? Difficulty of Paying Living Expenses: Not very hard  ?Food Insecurity: No Food Insecurity  ? Worried About Paediatric nurse in the Last Year: Never true  ? Ran Out of Food in the Last Year: Never true  ?Transportation Needs: No Transportation Needs  ? Lack of Transportation (Medical): No  ? Lack of Transportation (Non-Medical): No  ?Physical Activity: Sufficiently Active  ? Days of Exercise per Week: 5 days  ? Minutes of Exercise per Session: 30 min  ?Stress: No Stress Concern Present  ? Feeling of Stress : Not at all  ?Social Connections: Moderately Integrated  ? Frequency of Communication with Friends and Family: More than three times a week  ? Frequency of Social Gatherings with Friends and Family: Once a week  ? Attends Religious Services: 1 to 4 times per year  ? Active Member of Clubs or Organizations: No  ? Attends Archivist Meetings: Never  ? Marital Status: Married  ?Intimate Partner Violence: Not At Risk  ? Fear of Current or Ex-Partner: No  ? Emotionally Abused: No  ? Physically Abused: No  ? Sexually Abused: No  ?  ? ?Review of Systems: ?General: negative for chills, fever, night sweats or weight changes.  ?Cardiovascular: negative for chest pain, dyspnea on exertion, edema, orthopnea, palpitations, paroxysmal nocturnal dyspnea or shortness of breath ?Dermatological: negative for rash ?Respiratory: negative for cough or wheezing ?Urologic: negative for hematuria ?Abdominal: negative for nausea, vomiting, diarrhea, bright red blood per rectum, melena, or hematemesis ?Neurologic: negative for visual changes, syncope, or dizziness ?All other systems reviewed and are otherwise negative except as noted above. ? ? ? ?Blood pressure 120/74, pulse 69, height '5\' 5"'$  (1.651 m), weight 171 lb (77.6 kg).  ?General appearance: alert and no distress ?Neck: no adenopathy, no carotid bruit, no JVD, supple, symmetrical, trachea midline, and thyroid not enlarged, symmetric, no tenderness/mass/nodules ?Lungs: clear to auscultation bilaterally ?Heart: regular rate and rhythm, S1, S2 normal, no murmur, click, rub or  gallop ?Extremities: extremities normal, atraumatic, no cyanosis or edema ?Pulses: 2+ and symmetric ?Skin: Skin color, texture, turgor normal. No rashes or lesions ?Neurologic: Grossly normal ? ?EKG sinus rhythm at 69 with occasional PVCs.  I personally reviewed this EKG. ? ?ASSESSMENT AND PLAN:  ? ?Hyperlipidemia ?History of hyperlipidemia on Crestor 5 mg a day with lipid profile performed 09/11/2021 revealing total cholesterol 121, LDL 38 and HDL of 70.  Pain and "some "thigh weakness".  Apparently his mother was statin intolerant as well.  I have asked him to take a 1 month statin holiday and if his thigh symptoms improved significantly we will come up with an alternative to his lipid-lowering therapy. ? ?Hypertension ?History of essential hypertension a blood pressure measured today at 120/74.  He  is on hydrochlorothiazide. ? ?PAF (paroxysmal atrial fibrillation) ([place]) ?Maintaining sinus rhythm on amiodarone and apixaban. ? ?Coronary artery calcification ?Chest CT performed 09/24/2019 showed heavy coronary artery calcification.  He is asymptomatic from a coronary point of view and had a negative exercise Myoview stress test 04/23/2018. ? ? ? ? ?Lorretta Harp MD FACP,FACC,FAHA, FSCAI ?12/11/2021 ?9:53 AM ?

## 2022-01-10 ENCOUNTER — Encounter: Payer: Self-pay | Admitting: Physical Therapy

## 2022-01-10 ENCOUNTER — Ambulatory Visit: Payer: PPO | Attending: Neurology | Admitting: Physical Therapy

## 2022-01-10 DIAGNOSIS — R2689 Other abnormalities of gait and mobility: Secondary | ICD-10-CM

## 2022-01-10 DIAGNOSIS — M6281 Muscle weakness (generalized): Secondary | ICD-10-CM

## 2022-01-10 DIAGNOSIS — R2681 Unsteadiness on feet: Secondary | ICD-10-CM

## 2022-01-10 DIAGNOSIS — G20 Parkinson's disease: Secondary | ICD-10-CM | POA: Diagnosis not present

## 2022-01-10 DIAGNOSIS — R293 Abnormal posture: Secondary | ICD-10-CM

## 2022-01-10 NOTE — Patient Instructions (Signed)
Sit to Stand Transfers: ? ?Scoot out to the edge of the chair ?Place your feet flat on the floor, shoulder width apart.  Make sure your feet are tucked just under your knees. ?Lean forward (nose over toes) with momentum, and stand up tall with your best posture.  If you need to use your arms, use them as a quick boost up to stand. ?If you are in a low or soft chair, you can lean back and then forward up to stand, in order to get more momentum. ?Once you are standing, make sure you are looking ahead and standing tall.  (You may want to try shifting your weight side to side before you take the first big step with your walking). ? ?To sit down: ? ?Back up until you feel the chair behind your legs. ?Bend at you hips, reaching  Back for you chair, if needed, then slowly squat to sit down on your chair. ?

## 2022-01-10 NOTE — Therapy (Signed)
?[facility] ?[address], [address] ?[place], [place], [address] ?Phone: [phone]   Fax:  [phone] ? ?Physical Therapy Evaluation ? ?Patient Details  ?Name: Walter Brown ?MRN: [id number] ?Date of Birth: 1941/03/29 ?Referring Provider (PT): Alonza Bogus, DO ? ? ?Encounter Date: 01/10/2022 ? ? PT End of Session - 01/10/22 1147   ? ? Visit Number 1   ? Number of Visits 13   ? Date for PT Re-Evaluation 02/22/22   ? Authorization Type [employer]   ? Progress Note Due on Visit 10   ? PT Start Time 1149   ? PT Stop Time 1232   ? PT Time Calculation (min) 43 min   ? Activity Tolerance Patient tolerated treatment well   ? Behavior During Therapy [facility] for tasks assessed/performed   ? ?  ?  ? ?  ? ? ?Past Medical History:  ?Diagnosis Date  ? Anemia   ? mild  ? Arthritis 04/06/2017  ? Asthma   ? childhood  ? ASVD (arteriosclerotic vascular disease) 09/07/2018  ? Basal cell carcinoma   ? skin- on nose- basal cell (20 yrs ago) forehead 1 year ago  ? BPH (benign prostatic hyperplasia)   ? Bradycardia 06/28/2018  ? Cerumen impaction 08/10/2012  ? Chicken pox as child  ? Coronary artery calcification 11/23/2019  ? Elevated LFTs   ? [place] measles as a child  ? Hyperlipidemia   ? Hypertension   ? Internal hemorrhoids   ? Irregular cardiac rhythm 03/10/2018  ? Macular degeneration   ? Major depressive disorder 08/01/2012  ? Mild neurocognitive disorder due to Parkinson's disease ([place]) 12/27/2019  ? Obstructive sleep apnea 11/09/2018  ? Patient reports mild symptoms; was not prescribed a CPAP machine  ? Otitis externa 08/10/2012  ? PAF (paroxysmal atrial fibrillation)   ? Parkinson's disease 05/19/2019  ? Thrombocytopenia   ? TIA (transient ischemic attack) 03/10/2018  ? ? ?Past Surgical History:  ?Procedure Laterality Date  ? [place]    ? very young, b/l  ? EXCISIONAL HEMORRHOIDECTOMY    ? HYDROCELE EXCISION / REPAIR  2012  ? b/l  ? SKIN CANCER EXCISION    ? nose and  forehead, basal cell CA  ? ? ?There were no vitals filed for this visit. ? ? ? Subjective Assessment - 01/10/22 1153   ? ? Subjective My balance is the most aggravating symptom; I've fallen a few times.  Especially difficulty rounding a corner or when my head gets out of base of support.  Go to gym once/week and typically walk in the park several times per week.  Sometimes I get more forward and feel like I'm getting ahead of myself.   ? Pertinent History See Problem list   ? Patient Stated Goals Pt's goals for therapy are to be more stable walking.   ? Currently in Pain? No/denies   ? ?  ?  ? ?  ? ? ? ? ? [facility] PT Assessment - 01/10/22 1155   ? ?  ? Assessment  ? Medical Diagnosis Parkinson's disease   ? Referring Provider (PT) Alonza Bogus, DO   ? Onset Date/Surgical Date 10/25/21   MD visit  ?  ? Precautions  ? Precautions Fall   ?  ? Balance Screen  ? Has the patient fallen in the past 6 months Yes   ? How many times? 3-4   ? Has the patient had a decrease in activity level because of  a fear of falling?  No   ? Is the patient reluctant to leave their home because of a fear of falling?  No   ?  ? Home Environment  ? Living Environment Private residence   ? Living Arrangements Spouse/significant other   ? Available Help at Discharge Family   ? Type of Home House   ? Home Access Stairs to enter   ? Entrance Stairs-Number of Steps 2   ? Entrance Stairs-Rails Right   ? Home Layout One level   ? Home Equipment Grab bars - toilet;Grab bars - tub/shower;Other (comment)   Has a single walking pole  ?  ? Prior Function  ? Level of Independence Independent   ? Leisure Enjoys going to the gym once/week and walking in the park with wife several times per week.   ?  ? Observation/Other Assessments  ? Focus on Therapeutic Outcomes (FOTO)  NA   ?  ? Posture/Postural Control  ? Posture/Postural Control Postural limitations   ? Postural Limitations Rounded Shoulders;Forward head;Flexed trunk   ? Posture Comments R shoulder lower  than L   ?  ? Tone  ? Assessment Location Right Lower Extremity;Left Lower Extremity   ?  ? ROM / Strength  ? AROM / PROM / Strength Strength;AROM   ?  ? AROM  ? Overall AROM  Within functional limits for tasks performed   ?  ? Strength  ? Overall Strength Comments Grossly tested at least 4/5 BLEs, goes into trunk extension with MMT of BLEs   ? Strength Assessment Site Hip;Knee;Ankle   ?  ? Transfers  ? Transfers Sit to Stand;Stand to Sit   ? Sit to Stand 5: Supervision;Without upper extremity assist;From chair/3-in-1   ? Five time sit to stand comments  9.31   ? Stand to Sit 5: Supervision;Without upper extremity assist;To chair/3-in-1;Uncontrolled descent   ? Comments Decreased forward lean; pt tends to push through posterior aspect of legs to stand.  Reports increased difficulty with getting out of car, recliner.   ?  ? Ambulation/Gait  ? Ambulation/Gait Yes   ? Ambulation/Gait Assistance 6: Modified independent (Device/Increase time);5: Supervision   supervision for turns  ? Ambulation Distance (Feet) 60 Feet   x 4  ? Assistive device None   ? Gait Pattern Step-through pattern;Decreased arm swing - right;Decreased arm swing - left;Decreased step length - right;Decreased step length - left;Decreased trunk rotation;Trunk flexed   ? Ambulation Surface Level;Indoor   ? Gait velocity 12.36 sec = 2.65 ft/sec   ?  ? Standardized Balance Assessment  ? Standardized Balance Assessment Timed Up and Go Test;Mini-BESTest   ?  ? Mini-BESTest  ? Sit To Stand Normal: Comes to stand without use of hands and stabilizes independently.   ? Rise to Toes < 3 s.   ? Stand on one leg (left) Moderate: < 20 s   2.34, .87  ? Stand on one leg (right) Moderate: < 20 s   3 sec, 1.27  ? Stand on one leg - lowest score 1   ? Compensatory Stepping Correction - Forward Normal: Recovers independently with a single, large step (second realignement is allowed).   ? Compensatory Stepping Correction - Backward Moderate: More than one step is required  to recover equilibrium   ? Compensatory Stepping Correction - Left Lateral Moderate: Several steps to recover equilibrium   ? Compensatory Stepping Correction - Right Lateral Moderate: Several steps to recover equilibrium   ? Stepping Corredtion Lateral -  lowest score 1   ? Stance - Feet together, eyes open, firm surface  Normal: 30s   ? Stance - Feet together, eyes closed, foam surface  Moderate: < 30s   24 sec  ? Incline - Eyes Closed Moderate: Stands independently < 30s OR aligns with surface   posterior lean at 1-2 sec; 2nd trial able to hold 30 sec  ? Change in Gait Speed Normal: Significantly changes walkling speed without imbalance   ? Walk with head turns - Horizontal Moderate: performs head turns with reduction in gait speed.   ? Walk with pivot turns Severe: Cannot turn with feet close at any speed without imbalance.   ? Step over obstacles Severe: Unable to step over box OR steps around box   ? Timed UP & GO with Dual Task Moderate: Dual Task affects either counting OR walking (>10%) when compared to the TUG without Dual Task.   ? Mini-BEST total score 15   ?  ? Timed Up and Go Test  ? Normal TUG (seconds) 16   ? Manual TUG (seconds) 16.18   ? Cognitive TUG (seconds) 17.13   ? TUG Comments Scores >13.5-15 sec indicates increased fall risk   ?  ? RLE Tone  ? RLE Tone Mild   ?  ? LLE Tone  ? LLE Tone Within Functional Limits   ? ?  ?  ? ?  ? ? ? ? ? ? ? ? ? ? ? ? ? ?Objective measurements completed on examination: See above findings.  ? ? ? ? ? ? ? ? ? ? ? ? ? ? PT Education - 01/10/22 1653   ? ? Education Details PT eval results, POC; initial HEP for sit<>stand technique   ? Person(s) Educated Patient   ? Methods Explanation;Demonstration;Handout;Verbal cues   ? Comprehension Verbalized understanding;Returned demonstration;Verbal cues required   ? ?  ?  ? ?  ? ? ? PT Short Term Goals - 01/10/22 1659   ? ?  ? PT SHORT TERM GOAL #1  ? Title Pt will be independent with HEP for improved strength, balance,  transfers, and gait.  TARGET 02/08/2022   ? Time 4   ? Period Weeks   ? Status New   ?  ? PT SHORT TERM GOAL #2  ? Title Pt will perform 5x sit<>stand with no posterior lean, no LOB, to demonstrate improved fun

## 2022-01-16 ENCOUNTER — Ambulatory Visit: Payer: PPO | Admitting: Physical Therapy

## 2022-01-16 ENCOUNTER — Encounter: Payer: Self-pay | Admitting: Physical Therapy

## 2022-01-16 DIAGNOSIS — R2681 Unsteadiness on feet: Secondary | ICD-10-CM

## 2022-01-16 DIAGNOSIS — R293 Abnormal posture: Secondary | ICD-10-CM

## 2022-01-16 DIAGNOSIS — M6281 Muscle weakness (generalized): Secondary | ICD-10-CM

## 2022-01-16 DIAGNOSIS — R2689 Other abnormalities of gait and mobility: Secondary | ICD-10-CM

## 2022-01-16 NOTE — Patient Instructions (Signed)
Access Code: [email or web address] ?URL: [email or web address] ?Date: 01/16/2022 ?Prepared by: [facility] ? ?Exercises ?- Heel Toe Raises with Counter Support  - 1-2 x daily - 5 x weekly - 2 sets - 10 reps - 3 sec hold ?- Mini Squat with Counter Support  - 1 x daily - 5 x weekly - 2 sets - 10 reps - 3 sec hold ?- Forward Step Over with Counter Support  - 1 x daily - 5 x weekly - 1-2 sets - 10 reps ?

## 2022-01-16 NOTE — Progress Notes (Signed)
? ?Subjective:  ? ? Patient ID: Walter Brown, male    DOB: Jul 04, 1941, 81 y.o.   MRN: [id number] ? ?Chief Complaint  ?Patient presents with  ? Follow-up  ? Thigh pain  ? Dysphagia  ? ? ?HPI ?Patient is in today for a follow up and his noting 2 separate episodes of coughing up some red blood streaked in sputum in the past 4 weeks. The episodes were about 3 weeks apart. No ovious fevers or chills. Notes some congestion as well with pressure into right ear and decreased hearing int hat ear, without tinnitus or drainage. Denies CP/palp/SOB/HA/fevers/GI or GU c/o. Taking meds as prescribed. Physical therapy has helped his unsteady gait some.  ? ?Past Medical History:  ?Diagnosis Date  ? Anemia   ? mild  ? Arthritis 04/06/2017  ? Asthma   ? childhood  ? ASVD (arteriosclerotic vascular disease) 09/07/2018  ? Basal cell carcinoma   ? skin- on nose- basal cell (20 yrs ago) forehead 1 year ago  ? BPH (benign prostatic hyperplasia)   ? Bradycardia 06/28/2018  ? Cerumen impaction 08/10/2012  ? Chicken pox as child  ? Coronary artery calcification 11/23/2019  ? Elevated LFTs   ? [place] measles as a child  ? Hyperlipidemia   ? Hypertension   ? Internal hemorrhoids   ? Irregular cardiac rhythm 03/10/2018  ? Macular degeneration   ? Major depressive disorder 08/01/2012  ? Mild neurocognitive disorder due to Parkinson's disease ([place]) 12/27/2019  ? Obstructive sleep apnea 11/09/2018  ? Patient reports mild symptoms; was not prescribed a CPAP machine  ? Otitis externa 08/10/2012  ? PAF (paroxysmal atrial fibrillation)   ? Parkinson's disease 05/19/2019  ? Thrombocytopenia   ? TIA (transient ischemic attack) 03/10/2018  ? ? ?Past Surgical History:  ?Procedure Laterality Date  ? [place]    ? very young, b/l  ? EXCISIONAL HEMORRHOIDECTOMY    ? HYDROCELE EXCISION / REPAIR  2012  ? b/l  ? SKIN CANCER EXCISION    ? nose and forehead, basal cell CA  ? ? ?Family History  ?Problem Relation Age of Onset  ? Hypertension Mother   ?  Other Mother   ?     MRSA  ? Heart disease Mother   ?     stents  ? Heart disease Father   ? Hypertension Father   ? Prostate cancer Father 30  ? Parkinson's disease Father   ? Prostate cancer Maternal Grandfather   ? ADD / ADHD Son   ?     ADHD  ? Other Son 24  ?     part of 1 lung removed- due to infection  ? Stroke Maternal Grandmother   ? Cancer Paternal Grandfather   ?     EYE  ? ? ?Social History  ? ?Socioeconomic History  ? Marital status: Married  ?  Spouse name: Not on file  ? Number of children: 2  ? Years of education: 22  ? Highest education level: Bachelor's degree (e.g., BA, AB, BS)  ?Occupational History  ? Occupation: retired  ?Tobacco Use  ? Smoking status: Former  ?  Packs/day: 1.50  ?  Years: 15.00  ?  Pack years: 22.50  ?  Types: Cigarettes  ?  Start date: 10/07/1978  ? Smokeless tobacco: Never  ?Vaping Use  ? Vaping Use: Never used  ?Substance and Sexual Activity  ? Alcohol use: Yes  ?  Alcohol/week: 7.0 - 14.0 standard drinks  ?  Types: 7 - 14 Glasses of wine per week  ?  Comment: 1-2 glasses of wine with dinner  ? Drug use: No  ? Sexual activity: Yes  ?  Comment: lives with wife, 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  ?Other Topics Concern  ? Not on file  ?Social History Narrative  ? Right handed   ? ?Social Determinants of Health  ? ?Financial Resource Strain: Low Risk   ? Difficulty of Paying Living Expenses: Not very hard  ?Food Insecurity: No Food Insecurity  ? Worried About Charity fundraiser in the Last Year: Never true  ? Ran Out of Food in the Last Year: Never true  ?Transportation Needs: No Transportation Needs  ? Lack of Transportation (Medical): No  ? Lack of Transportation (Non-Medical): No  ?Physical Activity: Sufficiently Active  ? Days of Exercise per Week: 5 days  ? Minutes of Exercise per Session: 30 min  ?Stress: No Stress Concern Present  ? Feeling of Stress : Not at all  ?Social Connections: Moderately Integrated  ? Frequency  of Communication with Friends and Family: More than three times a week  ? Frequency of Social Gatherings with Friends and Family: Once a week  ? Attends Religious Services: 1 to 4 times per year  ? Active Member of Clubs or Organizations: No  ? Attends Archivist Meetings: Never  ? Marital Status: Married  ?Intimate Partner Violence: Not At Risk  ? Fear of Current or Ex-Partner: No  ? Emotionally Abused: No  ? Physically Abused: No  ? Sexually Abused: No  ? ? ?Outpatient Medications Prior to Visit  ?Medication Sig Dispense Refill  ? amiodarone (PACERONE) 200 MG tablet Take 1 tablet (200 mg total) by mouth daily. 90 tablet 0  ? apixaban (ELIQUIS) 5 MG TABS tablet TAKE 1 TABLET BY MOUTH TWICE A DAY 180 tablet 1  ? carbidopa-levodopa (SINEMET IR) 25-100 MG tablet TAKE 1 TABLET BY MOUTH 3 (THREE) TIMES DAILY. 7AM/11AM/4PM 270 tablet 1  ? hydrochlorothiazide (HYDRODIURIL) 25 MG tablet TAKE 1 TABLET BY MOUTH EVERY DAY 90 tablet 1  ? Melatonin 10 MG TABS Take 10 mg by mouth daily as needed.    ? Multiple Vitamins-Minerals (ICAPS AREDS 2 PO) Take 1 capsule by mouth in the morning and at bedtime.    ? rosuvastatin (CRESTOR) 5 MG tablet Take 1 tablet (5 mg total) by mouth daily. 90 tablet 4  ? sertraline (ZOLOFT) 100 MG tablet TAKE 1 TABLET BY MOUTH EVERY DAY 90 tablet 1  ? terazosin (HYTRIN) 10 MG capsule Take 10 mg by mouth at bedtime.     ? carbidopa-levodopa (SINEMET CR) 50-200 MG tablet Take 1 tablet by mouth at bedtime.    ? acetaminophen (TYLENOL) 500 MG tablet Take 1-2 tablets (500-1,000 mg total) by mouth every 6 (six) hours as needed. (Patient not taking: Reported on 01/10/2022) 30 tablet 0  ? Coenzyme Q10 100 MG TABS Take 1 tablet by mouth 2 (two) times daily. (Patient not taking: Reported on 01/10/2022)    ? diclofenac Sodium (VOLTAREN) 1 % GEL Apply 2 g topically 3 (three) times daily as needed. 50 g 0  ? methylPREDNISolone (MEDROL DOSEPAK) 4 MG TBPK tablet Follow package insert (Patient not taking:  Reported on 01/10/2022) 21 each 0  ? ?No facility-administered medications prior to visit.  ? ? ?Allergies  ?Allergen Reactions  ? Albuterol Sulfate Palpitations  ? Azithromycin Other (See Comments)  ?  Hepatotoxicity  ?  Doxycycline Hyclate Other (See Comments)  ?  Taken with Azithromycin and had Heaptotoxicity  ? ? ?Review of Systems  ?Constitutional:  Negative for fever and malaise/fatigue.  ?HENT:  Positive for congestion, ear pain and hearing loss. Negative for ear discharge and tinnitus.   ?Eyes:  Negative for blurred vision.  ?Respiratory:  Positive for cough and hemoptysis. Negative for shortness of breath.   ?Cardiovascular:  Negative for chest pain, palpitations and leg swelling.  ?Gastrointestinal:  Negative for abdominal pain, blood in stool and nausea.  ?Genitourinary:  Negative for dysuria and frequency.  ?Musculoskeletal:  Negative for falls.  ?Skin:  Negative for rash.  ?Neurological:  Negative for dizziness, loss of consciousness and headaches.  ?Endo/Heme/Allergies:  Negative for environmental allergies.  ?Psychiatric/Behavioral:  Negative for depression. The patient is not nervous/anxious.   ? ?   ?Objective:  ?  ?Physical Exam ?Constitutional:   ?   General: He is not in acute distress. ?   Appearance: Normal appearance. He is not ill-appearing or toxic-appearing.  ?HENT:  ?   Head: Normocephalic and atraumatic.  ?   Right Ear: External ear normal.  ?   Left Ear: External ear normal.  ?   Nose: Nose normal.  ?   Mouth/Throat:  ?   Comments: Oropharynx erythematous ?Eyes:  ?   General:     ?   Right eye: No discharge.     ?   Left eye: No discharge.  ?Cardiovascular:  ?   Rate and Rhythm: Normal rate.  ?   Heart sounds: No murmur heard. ?Pulmonary:  ?   Effort: Pulmonary effort is normal.  ?Musculoskeletal:  ?   Cervical back: Neck supple.  ?Skin: ?   Findings: No rash.  ?Neurological:  ?   Mental Status: He is alert and oriented to person, place, and time.  ?Psychiatric:     ?   Behavior: Behavior  normal.  ? ? ?BP 130/80 (BP Location: Left Arm, Patient Position: Sitting, Cuff Size: Normal)   Pulse (!) 53   Resp 20   Ht '5\' 5"'$  (1.651 m)   Wt 171 lb 12.8 oz (77.9 kg)   SpO2 99%   BMI 28.59 kg/m?  ?Wt [place] Mort

## 2022-01-16 NOTE — Therapy (Signed)
[place] ?[facility] ?[address], [address] ?[place], [place], [address] ?Phone: [phone]   Fax:  [phone] ? ?Physical Therapy Treatment ? ?Patient Details  ?Name: Walter Brown ?MRN: [id number] ?Date of Birth: 03/26/1941 ?Referring Provider (PT): Alonza Bogus, DO ? ? ?Encounter Date: 01/16/2022 ? ? PT End of Session - 01/16/22 1526   ? ? Visit Number 2   ? Number of Visits 13   ? Date for PT Re-Evaluation 02/22/22   ? Authorization Type [employer]   ? Progress Note Due on Visit 10   ? PT Start Time 1528   ? PT Stop Time 1616   ? PT Time Calculation (min) 48 min   ? Activity Tolerance Patient tolerated treatment well   ? Behavior During Therapy [facility] for tasks assessed/performed   ? ?  ?  ? ?  ? ? ?Past Medical History:  ?Diagnosis Date  ? Anemia   ? mild  ? Arthritis 04/06/2017  ? Asthma   ? childhood  ? ASVD (arteriosclerotic vascular disease) 09/07/2018  ? Basal cell carcinoma   ? skin- on nose- basal cell (20 yrs ago) forehead 1 year ago  ? BPH (benign prostatic hyperplasia)   ? Bradycardia 06/28/2018  ? Cerumen impaction 08/10/2012  ? Chicken pox as child  ? Coronary artery calcification 11/23/2019  ? Elevated LFTs   ? [place] measles as a child  ? Hyperlipidemia   ? Hypertension   ? Internal hemorrhoids   ? Irregular cardiac rhythm 03/10/2018  ? Macular degeneration   ? Major depressive disorder 08/01/2012  ? Mild neurocognitive disorder due to Parkinson's disease ([place]) 12/27/2019  ? Obstructive sleep apnea 11/09/2018  ? Patient reports mild symptoms; was not prescribed a CPAP machine  ? Otitis externa 08/10/2012  ? PAF (paroxysmal atrial fibrillation)   ? Parkinson's disease 05/19/2019  ? Thrombocytopenia   ? TIA (transient ischemic attack) 03/10/2018  ? ? ?Past Surgical History:  ?Procedure Laterality Date  ? [place]    ? very young, b/l  ? EXCISIONAL HEMORRHOIDECTOMY    ? HYDROCELE EXCISION / REPAIR  2012  ? b/l  ? SKIN CANCER EXCISION    ? nose and  forehead, basal cell CA  ? ? ?There were no vitals filed for this visit. ? ? Subjective Assessment - 01/16/22 1526   ? ? Subjective No changes, no falls since last visit   ? Pertinent History See Problem list   ? Patient Stated Goals Pt's goals for therapy are to be more stable walking.   ? Currently in Pain? No/denies   ? ?  ?  ? ?  ? ? ? ? ? ? ? ? ? ? ? ? ? ? ? ? ? ? ? ? [place] Adult PT Treatment/Exercise - 01/16/22 0001   ? ?  ? Transfers  ? Transfers Sit to Stand;Stand to Sit   ? Sit to Stand 5: Supervision;Without upper extremity assist;From chair/3-in-1   ? Stand to Sit 5: Supervision;Without upper extremity assist;To chair/3-in-1;Uncontrolled descent   ? Number of Reps 10 reps   ? Comments Cues for upright posture upon standing   ?  ? Ambulation/Gait  ? Ambulation/Gait Yes   ? Ambulation/Gait Assistance 5: Supervision   ? Ambulation Distance (Feet) 480 Feet   240  ? Assistive device None   ? Gait Pattern Step-through pattern;Decreased arm swing - right;Decreased arm swing - left;Decreased step length - right;Decreased step length - left;Decreased trunk rotation;Trunk flexed   ?  Ambulation Surface Level;Indoor   ? Gait Comments Gait with resisted band around waist, for tactile (in addition to) verbal cues for upright posture, increased step length/heelstrike (pt is able to have relaxed arm swing with longer step length).  Brief stopping rest breaks to reinforce tall posture, increased step length.   ?  ? Exercises  ? Exercises Knee/Hip   ?  ? Knee/Hip Exercises: Stretches  ? Active Hamstring Stretch Right;Left;3 reps;20 seconds;Limitations   ? Active Hamstring Stretch Limitations foot propped on 4" block, cues for technique   ? ?  ?  ? ?  ? ? ? ? ? ? Balance Exercises - 01/16/22 0001   ? ?  ? Balance Exercises: Standing  ? Stepping Strategy Posterior;UE support;10 reps;Limitations   ? Stepping Strategy Limitations 10 reps consecutive, then alternating legs x 10 reps   ? Step Ups Forward;6 inch;UE support  2;Limitations   ? Step Ups Limitations 10 reps   ? Step Over Hurdles / Cones Forward step over low orange cones, x 10 reps, then 10 alternating legs; side step over low cones, x 10, then alternating legs x 10   ? Heel Raises Both;10 reps;Limitations   ? Heel Raises Limitations 2 sets   ? Toe Raise Both;10 reps;Limitations   ? Toe Raise Limitations 2 sets   ? Other Standing Exercises Hip strategy with ant/posterior rocking x 10, 2 sets   ? ?  ?  ? ?  ? ?Pt performs PWR! Moves in standing position x 10 reps ?  ?PWR! Up for improved posture ? ?PWR! Rock for improved weighshifting ? ?PWR! Twist for improved trunk rotation  ? ?PWR! Step for improved step initiation  ? ?Verbal, visual, tactile Cues provided for technique, intensity.  Cues to slow pace and increase amplitude of movement patterns. ? ? ? PT Education - 01/16/22 1615   ? ? Education Details Additions to HEP   ? Person(s) Educated Patient   ? Methods Explanation;Demonstration;Handout;Verbal cues   ? Comprehension Verbalized understanding;Returned demonstration;Verbal cues required;Need further instruction   ? ?  ?  ? ?  ? ? ? PT Short Term Goals - 01/10/22 1659   ? ?  ? PT SHORT TERM GOAL #1  ? Title Pt will be independent with HEP for improved strength, balance, transfers, and gait.  TARGET 02/08/2022   ? Time 4   ? Period Weeks   ? Status New   ?  ? PT SHORT TERM GOAL #2  ? Title Pt will perform 5x sit<>stand with no posterior lean, no LOB, to demonstrate improved functional strength and transfer efficiency.   ? Time 4   ? Period Weeks   ? Status New   ?  ? PT SHORT TERM GOAL #3  ? Title Pt will improve TUG score to less than or equal to 13.5 sec for decreased fall risk.   ? Baseline 16 sec   ? Time 4   ? Period Weeks   ? Status New   ? ?  ?  ? ?  ? ? ? ? PT Long Term Goals - 01/10/22 1701   ? ?  ? PT LONG TERM GOAL #1  ? Title Pt will be independent with progression of HEP for improved strength, balance, transfers, and gait.  TARGET 02/22/2022   ? Time 6   ?  Period Weeks   ? Status New   ?  ? PT LONG TERM GOAL #2  ? Title Pt will improve MiniBESTest score  to at least 19/28 to decrease fall risk.   ? Baseline 15/28   ? Time 6   ? Period Weeks   ? Status New   ?  ? PT LONG TERM GOAL #3  ? Title Pt will improve TUG cognitive score to less than or equal to 15 sec for decreased fall risk.   ? Baseline 17.13 sec   ? Time 6   ? Period Weeks   ? Status New   ?  ? PT LONG TERM GOAL #4  ? Title Pt will verbalize understanding of fall prevention in home environment.   ? Time 6   ? Period Weeks   ? Status New   ? ?  ?  ? ?  ? ? ? ? ? ? ? ? Plan - 01/16/22 1710   ? ? Clinical Impression Statement Skilled PT session focused on hip/ankle/step strategy work, postural re-education, and emphasis on large amplitude movements.  Emphasized and educated patient in larger amplitude (exaggerated movement patterns) to help with better posture with gait and balance.  Pt needs cues throughout exercises to slow pace, return to tall posture and larger movement patterns.  He will continue to benefit from skilled PT towards goals for improved funcitonal mobility and decreased fall risk.   ? Personal Factors and Comorbidities Comorbidity 3+   ? Comorbidities See Problem list   ? Examination-Activity Limitations Locomotion Level;Transfers;Stand   ? Examination-Participation Restrictions Community Activity;Other   community fitness  ? Stability/Clinical Decision Making Evolving/Moderate complexity   ? Rehab Potential Good   ? PT Frequency 2x / week   ? PT Duration 6 weeks   plus eval  ? PT Treatment/Interventions ADLs/Self Care Home Management;Gait training;Stair training;Functional mobility training;Therapeutic activities;Therapeutic exercise;Balance training;Neuromuscular re-education;Manual techniques;DME Instruction;Patient/family education   ? PT Next Visit Plan Review initial HEP; continue to work ankle, hip, step strategies, turning techniques, standing/sitting PWR! Moves; gait training with cues  for upright posture, longer step length/heelstrike and arm swing.   ? Consulted and Agree with Plan of Care Patient   ? ?  ?  ? ?  ? ? ?Patient will benefit from skilled therapeutic intervention in order to imp

## 2022-01-17 ENCOUNTER — Other Ambulatory Visit: Payer: Self-pay | Admitting: Family Medicine

## 2022-01-17 ENCOUNTER — Ambulatory Visit (INDEPENDENT_AMBULATORY_CARE_PROVIDER_SITE_OTHER): Payer: PPO | Admitting: Family Medicine

## 2022-01-17 ENCOUNTER — Encounter: Payer: Self-pay | Admitting: Family Medicine

## 2022-01-17 ENCOUNTER — Ambulatory Visit (HOSPITAL_BASED_OUTPATIENT_CLINIC_OR_DEPARTMENT_OTHER)
Admission: RE | Admit: 2022-01-17 | Discharge: 2022-01-17 | Disposition: A | Discharge status: Home / Self Care | Payer: PPO | Source: Ambulatory Visit | Attending: Family Medicine | Admitting: Family Medicine

## 2022-01-17 VITALS — BP 130/80 | HR 53 | Resp 20 | Ht 65.0 in | Wt 171.8 lb

## 2022-01-17 DIAGNOSIS — R739 Hyperglycemia, unspecified: Secondary | ICD-10-CM | POA: Diagnosis not present

## 2022-01-17 DIAGNOSIS — R042 Hemoptysis: Secondary | ICD-10-CM | POA: Diagnosis not present

## 2022-01-17 DIAGNOSIS — G20 Parkinson's disease: Secondary | ICD-10-CM

## 2022-01-17 DIAGNOSIS — R1314 Dysphagia, pharyngoesophageal phase: Secondary | ICD-10-CM | POA: Diagnosis not present

## 2022-01-17 DIAGNOSIS — E782 Mixed hyperlipidemia: Secondary | ICD-10-CM | POA: Diagnosis not present

## 2022-01-17 DIAGNOSIS — H6991 Unspecified Eustachian tube disorder, right ear: Secondary | ICD-10-CM

## 2022-01-17 DIAGNOSIS — H6981 Other specified disorders of Eustachian tube, right ear: Secondary | ICD-10-CM | POA: Diagnosis not present

## 2022-01-17 DIAGNOSIS — I10 Essential (primary) hypertension: Secondary | ICD-10-CM | POA: Diagnosis not present

## 2022-01-17 DIAGNOSIS — R2681 Unsteadiness on feet: Secondary | ICD-10-CM

## 2022-01-17 HISTORY — DX: Unspecified eustachian tube disorder, right ear: H69.91

## 2022-01-17 HISTORY — DX: Hemoptysis: R04.2

## 2022-01-17 LAB — LIPID PANEL
Cholesterol: 146 mg/dL (ref 0–200)
HDL: 72.3 mg/dL (ref >39.00)
LDL Cholesterol: 59 mg/dL (ref 0–99)
NonHDL: 73.24
Total CHOL/HDL Ratio: 2
Triglycerides: 69 mg/dL (ref 0.0–149.0)
VLDL: 13.8 mg/dL (ref 0.0–40.0)

## 2022-01-17 LAB — CBC
HCT: 40.1 % (ref 39.0–52.0)
Hemoglobin: 13.6 g/dL (ref 13.0–17.0)
MCHC: 33.9 g/dL (ref 30.0–36.0)
MCV: 95.2 fl (ref 78.0–100.0)
Platelets: 123 10*3/uL — ABNORMAL LOW (ref 150.0–400.0)
RBC: 4.22 Mil/uL (ref 4.22–5.81)
RDW: 13.9 % (ref 11.5–15.5)
WBC: 6.6 10*3/uL (ref 4.0–10.5)

## 2022-01-17 LAB — COMPREHENSIVE METABOLIC PANEL
ALT: 4 U/L (ref 0–53)
AST: 15 U/L (ref 0–37)
Albumin: 4.1 g/dL (ref 3.5–5.2)
Alkaline Phosphatase: 61 U/L (ref 39–117)
BUN: 17 mg/dL (ref 6–23)
CO2: 31 mEq/L (ref 19–32)
Calcium: 8.6 mg/dL (ref 8.4–10.5)
Chloride: 104 mEq/L (ref 96–112)
Creatinine, Ser: 0.95 mg/dL (ref 0.40–1.50)
GFR: 75.45 mL/min (ref >60.00)
Glucose, Bld: 86 mg/dL (ref 70–99)
Potassium: 4.1 mEq/L (ref 3.5–5.1)
Sodium: 140 mEq/L (ref 135–145)
Total Bilirubin: 0.7 mg/dL (ref 0.2–1.2)
Total Protein: 6 g/dL (ref 6.0–8.3)

## 2022-01-17 LAB — TSH: TSH: 1.27 u[IU]/mL (ref 0.35–5.50)

## 2022-01-17 LAB — HEMOGLOBIN A1C: Hgb A1c MFr Bld: 5.3 % (ref 4.6–6.5)

## 2022-01-17 MED ORDER — CEFDINIR 300 MG PO CAPS: 300.0000 mg | ORAL_CAPSULE | Freq: Two times a day (BID) | ORAL | 0 refills | Status: AC

## 2022-01-17 NOTE — Assessment & Plan Note (Addendum)
Has had one episode this week of slight blood streaked sputum had another episode about 3 weeks ago. Has seen GI  and has a f/u scheduled this coming weak. Will run a cxr today and start Famotidine 20 mg daily at bedtime. Refer to ENT for further evaluation. CXR reveals infection in RLL. Cefdinir prescribed bid and recheck CXR in 3-4 weeks.  ?

## 2022-01-17 NOTE — Assessment & Plan Note (Signed)
Well controlled, no changes to meds. Encouraged heart healthy diet such as the DASH diet and exercise as tolerated.  °

## 2022-01-17 NOTE — Assessment & Plan Note (Signed)
Right ear with some pressure and sense of decreased hearing noted referred to ENT for evaluation.  ?

## 2022-01-17 NOTE — Patient Instructions (Signed)
Add Famotidine/Pepcid 20 mg tabs over the counter, 1 tab at bedtime for the next month to see if that helps the blood in sputumeHemoptysis ?Hemoptysis is coughing up blood. With mild hemoptysis, you may cough up small amounts of blood-streaked saliva and mucus from your lungs (sputum). With severe hemoptysis, you may cough up a lot of blood. Coughing up 1-2 cups (240-480 mL) of blood within 24 hours is a medical emergency. ?Common causes of mild (nonmassive) hemoptysis include: ?An infection in your nose, throat, or lungs, such as bronchitis, pneumonia, bronchiectasis, asthma, or chronic obstructive pulmonary disease (COPD). ?Nosebleeds. ?Breathing in a foreign object. ?Common causes of severe (massive) hemoptysis include: ?Tuberculosis (TB). ?A tumor in the lungs or upper airway. ?A blood clot in the lungs (pulmonary embolism). ?Stomach bleeding or ulcer. ?Taking blood thinner (anticoagulant) medicine. ?Having a medical condition that keeps your blood from normal clotting. ?Sometimes, the cause is not known. ?Follow these instructions at home: ?Medicines ?If you were prescribed an antibiotic medicine, take it as told by your health care provider. Do not stop using the antibiotic even if you start to feel better. ?Take over-the-counter and prescription medicines only as told by your health care provider. ?General instructions ? ?Do not use any products that contain nicotine or tobacco. These products include cigarettes, chewing tobacco, and vaping devices, such as e-cigarettes. If you need help quitting, ask your health care provider. ?Return to your normal activities as told by your health care provider. Ask your health care provider what activities are safe for you. This includes any exercise or air travel. ?Keep all follow-up visits. This is important. ?Contact a health care provider if: ?You have a fever over 100.4?F (38?C). ?The amount of bleeding increases. ?The blood looks brighter or darker red. ?Get help  right away if you: ?Cough up fresh blood or blood clots. ?Cough up 1-2 cups (240-480 mL) in 24 hours. ?Have trouble breathing. ?Feel like you are choking. ?Have chest pain, light-headedness, or dizziness. ?These symptoms may be an emergency. Get help right away. Call 911. ?Do not wait to see if the symptoms will go away. ?Do not drive yourself to the hospital. ?Summary ?Hemoptysis is coughing up blood. ?Coughing up 1-2 cups (240-480 mL) of blood within 24 hours is a medical emergency. ?Do not use any products that contain nicotine or tobacco. These products include cigarettes, chewing tobacco, and vaping devices, such as e-cigarettes. If you need help quitting, ask your health care provider. ?This information is not intended to replace advice given to you by your health care provider. Make sure you discuss any questions you have with your health care provider. ?Document Revised: 04/30/2021 Document Reviewed: 04/30/2021 ?Elsevier Patient Education ? [place]. ? ?

## 2022-01-17 NOTE — Assessment & Plan Note (Signed)
Failed Simvastatin in the past and now has Rosuvastatin on hold due to thigh weakness/pain and he thinks it is improving some. He will follow up with cardiology and may consider some of the newer meds with them.  ?

## 2022-01-17 NOTE — Assessment & Plan Note (Signed)
He feels PT has helped somewhat.  ?

## 2022-01-17 NOTE — Assessment & Plan Note (Signed)
Follows with neurology 

## 2022-01-17 NOTE — Assessment & Plan Note (Signed)
hgba1c acceptable, minimize simple carbs. Increase exercise as tolerated.  

## 2022-01-18 ENCOUNTER — Other Ambulatory Visit: Payer: Self-pay

## 2022-01-18 ENCOUNTER — Ambulatory Visit: Payer: PPO | Admitting: Physical Therapy

## 2022-01-18 ENCOUNTER — Encounter: Payer: Self-pay | Admitting: Physical Therapy

## 2022-01-18 DIAGNOSIS — Z09 Encounter for follow-up examination after completed treatment for conditions other than malignant neoplasm: Secondary | ICD-10-CM

## 2022-01-18 DIAGNOSIS — R2689 Other abnormalities of gait and mobility: Secondary | ICD-10-CM

## 2022-01-18 DIAGNOSIS — M6281 Muscle weakness (generalized): Secondary | ICD-10-CM

## 2022-01-18 DIAGNOSIS — R2681 Unsteadiness on feet: Secondary | ICD-10-CM

## 2022-01-18 DIAGNOSIS — R293 Abnormal posture: Secondary | ICD-10-CM

## 2022-01-18 DIAGNOSIS — B999 Unspecified infectious disease: Secondary | ICD-10-CM

## 2022-01-18 NOTE — Therapy (Signed)
[place] ?[facility] ?[name], [address] ?[place], [place], [address] ?Phone: [phone]   Fax:  [phone] ? ?Physical Therapy Treatment ? ?Patient Details  ?Name: Walter Brown ?MRN: [id number] ?Date of Birth: 17-Jan-1941 ?Referring Provider (PT): Alonza Bogus, DO ? ? ?Encounter Date: 01/18/2022 ? ? PT End of Session - 01/18/22 0925   ? ? Visit Number 3   ? Number of Visits 13   ? Date for PT Re-Evaluation 02/22/22   ? Authorization Type [employer]   ? Progress Note Due on Visit 10   ? PT Start Time [phone]   ? PT Stop Time [phone]   ? PT Time Calculation (min) 46 min   ? Equipment Utilized During Treatment Gait belt   ? Activity Tolerance Patient tolerated treatment well   ? Behavior During Therapy [facility] for tasks assessed/performed   ? ?  ?  ? ?  ? ? ?Past Medical History:  ?Diagnosis Date  ? Anemia   ? mild  ? Arthritis 04/06/2017  ? Asthma   ? childhood  ? ASVD (arteriosclerotic vascular disease) 09/07/2018  ? Basal cell carcinoma   ? skin- on nose- basal cell (20 yrs ago) forehead 1 year ago  ? BPH (benign prostatic hyperplasia)   ? Bradycardia 06/28/2018  ? Cerumen impaction 08/10/2012  ? Chicken pox as child  ? Coronary artery calcification 11/23/2019  ? Elevated LFTs   ? [place] measles as a child  ? Hyperlipidemia   ? Hypertension   ? Internal hemorrhoids   ? Irregular cardiac rhythm 03/10/2018  ? Macular degeneration   ? Major depressive disorder 08/01/2012  ? Mild neurocognitive disorder due to Parkinson's disease ([place]) 12/27/2019  ? Obstructive sleep apnea 11/09/2018  ? Patient reports mild symptoms; was not prescribed a CPAP machine  ? Otitis externa 08/10/2012  ? PAF (paroxysmal atrial fibrillation)   ? Parkinson's disease 05/19/2019  ? Thrombocytopenia   ? TIA (transient ischemic attack) 03/10/2018  ? ? ?Past Surgical History:  ?Procedure Laterality Date  ? [place]    ? very young, b/l  ? EXCISIONAL HEMORRHOIDECTOMY    ? HYDROCELE EXCISION / REPAIR   2012  ? b/l  ? SKIN CANCER EXCISION    ? nose and forehead, basal cell CA  ? ? ?There were no vitals filed for this visit. ? ? Subjective Assessment - 01/18/22 0832   ? ? Subjective Denies falls. Has been working on [employer] "some."   ? Pertinent History See Problem list   ? Patient Stated Goals Pt's goals for therapy are to be more stable walking.   ? Currently in Pain? No/denies   ? ?  ?  ? ?  ? ? ? ? ? ? ? ? ? ? ? ? ? ? ? ? ? ? ? ? [place] Adult PT Treatment/Exercise - 01/18/22 0001   ? ?  ? Neuro Re-ed   ? Neuro Re-ed Details  forward/back stepping over 1/2 foal roll with 2/1/0 UE support and CGA 10x each;   ?  ? Knee/Hip Exercises: Aerobic  ? Recumbent Bike L4 x 6 min (UE/LEs) with cues to maintain 65-70 SPM   ?  ? Knee/Hip Exercises: Standing  ? Heel Raises Both;1 set;10 reps   ? Heel Raises Limitations heel/toe raise   cues to avoid compensating with hips  ? Forward Step Up Right;Left;1 set;15 reps;Hand Hold: 1;Step Height: 6";Limitations   ? Forward Step Up Limitations step up with vrbal cueing  for pacing and encouraging anterior weight shift to avoid posterior LOB   ? Functional Squat 1 set;10 reps   ? Functional Squat Limitations in II bars; cues to shift hips posteriorly   ?  ? Knee/Hip Exercises: Seated  ? Sit to Sand 2 sets;5 reps;without UE support   cues to scoot to edge of chair and lean nose over toes; 2nd set with 7# at chest  ? ?  ?  ? ?  ? ? ? ? ? ? Balance Exercises - 01/18/22 0001   ? ?  ? Balance Exercises: Standing  ? Wall Bumps Shoulder;Hip;Eyes opened;10 reps;Limitations   ? Wall Bumps Limitations consistent cueing for sequencing and pacing   ? Other Standing Exercises alt toe tap on 6" step 2x20" without UEs   CGA d/t intermittently catching toes on step; required cues to reposition feet  ? Other Standing Exercises Comments ant/pos weight shifts on foam with manual cueing at anterior hips to encourage anterior wt shift   ? ?  ?  ? ?  ? ? ? ? ? PT Education - 01/18/22 0925   ? ? Education Details  review of HEP   ? Person(s) Educated Patient   ? Methods Explanation;Demonstration   ? Comprehension Verbalized understanding;Returned demonstration   ? ?  ?  ? ?  ? ? ? PT Short Term Goals - 01/18/22 0926   ? ?  ? PT SHORT TERM GOAL #1  ? Title Pt will be independent with HEP for improved strength, balance, transfers, and gait.  TARGET 02/08/2022   ? Time 4   ? Period Weeks   ? Status On-going   ?  ? PT SHORT TERM GOAL #2  ? Title Pt will perform 5x sit<>stand with no posterior lean, no LOB, to demonstrate improved functional strength and transfer efficiency.   ? Time 4   ? Period Weeks   ? Status On-going   ?  ? PT SHORT TERM GOAL #3  ? Title Pt will improve TUG score to less than or equal to 13.5 sec for decreased fall risk.   ? Baseline 16 sec   ? Time 4   ? Period Weeks   ? Status On-going   ? ?  ?  ? ?  ? ? ? ? PT Long Term Goals - 01/18/22 0926   ? ?  ? PT LONG TERM GOAL #1  ? Title Pt will be independent with progression of HEP for improved strength, balance, transfers, and gait.  TARGET 02/22/2022   ? Time 6   ? Period Weeks   ? Status On-going   ?  ? PT LONG TERM GOAL #2  ? Title Pt will improve MiniBESTest score to at least 19/28 to decrease fall risk.   ? Baseline 15/28   ? Time 6   ? Period Weeks   ? Status On-going   ?  ? PT LONG TERM GOAL #3  ? Title Pt will improve TUG cognitive score to less than or equal to 15 sec for decreased fall risk.   ? Baseline 17.13 sec   ? Time 6   ? Period Weeks   ? Status On-going   ?  ? PT LONG TERM GOAL #4  ? Title Pt will verbalize understanding of fall prevention in home environment.   ? Time 6   ? Period Weeks   ? Status On-going   ? ?  ?  ? ?  ? ? ? ? ? ? ? ?  Plan - 01/18/22 0925   ? ? Clinical Impression Statement Patient arrived to session without new complaints. Reviewed HEP for max carryover, providing cues to avoid compensations and improve awareness  of foot positioning with balance activities. Good leg strength demonstrated with STS transfers, thus able to  progress to weighted resistance. Patient did require continued cues for anterior trunk lean and scooting to edge of seat. Worked on hip strategy and step ups with patient requiring continuous cues for pacing. Patient intermittently loses focus during sessions, requiring redirection. Patient tolerated session well and without complaints upon leaving.   ? Personal Factors and Comorbidities Comorbidity 3+   ? Comorbidities See Problem list   ? Examination-Activity Limitations Locomotion Level;Transfers;Stand   ? Examination-Participation Restrictions Community Activity;Other   community fitness  ? Stability/Clinical Decision Making Evolving/Moderate complexity   ? Rehab Potential Good   ? PT Frequency 2x / week   ? PT Duration 6 weeks   plus eval  ? PT Treatment/Interventions ADLs/Self Care Home Management;Gait training;Stair training;Functional mobility training;Therapeutic activities;Therapeutic exercise;Balance training;Neuromuscular re-education;Manual techniques;DME Instruction;Patient/family education   ? PT Next Visit Plan continue to work ankle, hip, step strategies, turning techniques, standing/sitting PWR! Moves; gait training with cues for upright posture, longer step length/heelstrike and arm swing.   ? Consulted and Agree with Plan of Care Patient   ? ?  ?  ? ?  ? ? ?Patient will benefit from skilled therapeutic intervention in order to improve the following deficits and impairments:  Abnormal gait, Difficulty walking, Decreased balance, Decreased mobility, Decreased strength, Postural dysfunction ? ?Visit Diagnosis: ?Unsteadiness on feet ? ?Other abnormalities of gait and mobility ? ?Abnormal posture ? ?Muscle weakness (generalized) ? ? ? ? ?Problem List ?Patient Active Problem List  ? Diagnosis Date Noted  ? Hemoptysis 01/17/2022  ? ETD (Eustachian tube dysfunction), right 01/17/2022  ? Dysphagia 09/11/2021  ? Unsteady gait 09/11/2021  ? Insomnia 02/06/2021  ? Depression, recurrent ([place]) 04/25/2020  ?  Hyperglycemia 04/25/2020  ? Mild neurocognitive disorder due to Parkinson's disease ([place]) 12/27/2019  ? Coronary artery calcification 11/23/2019  ? Parkinson's disease ([place]) 05/19/2019  ? PAF (paroxysmal atrial

## 2022-01-21 ENCOUNTER — Ambulatory Visit: Payer: PPO | Admitting: Physical Therapy

## 2022-01-22 ENCOUNTER — Telehealth: Payer: Self-pay

## 2022-01-22 ENCOUNTER — Telehealth: Payer: Self-pay | Admitting: Physician Assistant

## 2022-01-22 ENCOUNTER — Ambulatory Visit (HOSPITAL_COMMUNITY)
Admission: RE | Admit: 2022-01-22 | Discharge: 2022-01-22 | Disposition: A | Discharge status: Home / Self Care | Payer: PPO | Source: Ambulatory Visit | Attending: Physician Assistant | Admitting: Physician Assistant

## 2022-01-22 ENCOUNTER — Other Ambulatory Visit: Payer: Self-pay

## 2022-01-22 DIAGNOSIS — R1314 Dysphagia, pharyngoesophageal phase: Secondary | ICD-10-CM | POA: Diagnosis not present

## 2022-01-22 DIAGNOSIS — R1310 Dysphagia, unspecified: Secondary | ICD-10-CM | POA: Diagnosis not present

## 2022-01-22 DIAGNOSIS — K224 Dyskinesia of esophagus: Secondary | ICD-10-CM | POA: Diagnosis not present

## 2022-01-22 NOTE — Telephone Encounter (Signed)
Patient returned your call, please advise. 

## 2022-01-22 NOTE — Telephone Encounter (Signed)
Will route to pharm for input on anticoag, last OV 12/2021. ?

## 2022-01-22 NOTE — Telephone Encounter (Signed)
Returned call to patient. See 01/22/22 barium esophogram results for details.  ?

## 2022-01-22 NOTE — Telephone Encounter (Signed)
[facility] Pre-operative Risk Assessment  ?   ?Request for surgical clearance:     Endoscopy Procedure ? ?What type of surgery is being performed?     EGD ? ?When is this surgery scheduled?     02/11/22 ? ?What type of clearance is required ?   Pharmacy ? ?Are there any medications that need to be held prior to surgery and how long? Eliquis - 2 day hold ? ?Practice name and name of physician performing surgery?      [facility] ? ?What is your office phone and fax number?      Phone- [phone]  Fax- [phone] ? ?Anesthesia type (None, local, MAC, general) ?       MAC ? ?

## 2022-01-23 ENCOUNTER — Ambulatory Visit: Payer: PPO

## 2022-01-23 DIAGNOSIS — M6281 Muscle weakness (generalized): Secondary | ICD-10-CM

## 2022-01-23 DIAGNOSIS — R293 Abnormal posture: Secondary | ICD-10-CM

## 2022-01-23 DIAGNOSIS — R2681 Unsteadiness on feet: Secondary | ICD-10-CM

## 2022-01-23 DIAGNOSIS — R2689 Other abnormalities of gait and mobility: Secondary | ICD-10-CM

## 2022-01-23 NOTE — Telephone Encounter (Signed)
Ok for 24 hour hold for Eliquis ? ?

## 2022-01-23 NOTE — Telephone Encounter (Signed)
Patient with diagnosis of A fib on Eliquis  for anticoagulation.   ? ?Procedure: EGD ?Date of procedure: 02/11/22 ? ? ?CHA2DS2-VASc Score = 6  ?This indicates a 9.7% annual risk of stroke. ?The patient's score is based upon: ?CHF History: 0 ?HTN History: 1 ?Diabetes History: 0 ?Stroke History: 2 ?Vascular Disease History: 1 ?Age Score: 2 ?Gender Score: 0 ?  ? ?CrCl 68 mL/min ?Platelet count123K  ? ? ?Per office protocol, patient can hold Eliquis for 1 day prior to procedure.  Patient has history of TIA. If provider will not perform procedure without 2 day hold, will need authorization from cardiologist ?

## 2022-01-23 NOTE — Telephone Encounter (Signed)
Lm on vm for patient to return call. ? ?EGD instructions sent to patient via MyChart and mailed today. ? ? ?

## 2022-01-23 NOTE — Therapy (Signed)
[facility] ?[address], [address] ?[place], [place], [address] ?Phone: [phone]   Fax:  [phone] ? ?Physical Therapy Treatment ? ?Patient Details  ?Name: Walter Brown ?MRN: [id number] ?Date of Birth: 05-Apr-1941 ?Referring Provider (PT): Alonza Bogus, DO ? ? ?Encounter Date: 01/23/2022 ? ? PT End of Session - 01/23/22 1446   ? ? Visit Number 4   ? Number of Visits 13   ? Date for PT Re-Evaluation 02/22/22   ? Authorization Type [employer]   ? Progress Note Due on Visit 10   ? PT Start Time 4268   ? PT Stop Time 1530   ? PT Time Calculation (min) 45 min   ? Equipment Utilized During Treatment Gait belt   ? Activity Tolerance Patient tolerated treatment well   ? Behavior During Therapy [facility] for tasks assessed/performed   ? ?  ?  ? ?  ? ? ?Past Medical History:  ?Diagnosis Date  ? Anemia   ? mild  ? Arthritis 04/06/2017  ? Asthma   ? childhood  ? ASVD (arteriosclerotic vascular disease) 09/07/2018  ? Basal cell carcinoma   ? skin- on nose- basal cell (20 yrs ago) forehead 1 year ago  ? BPH (benign prostatic hyperplasia)   ? Bradycardia 06/28/2018  ? Cerumen impaction 08/10/2012  ? Chicken pox as child  ? Coronary artery calcification 11/23/2019  ? Elevated LFTs   ? [place] measles as a child  ? Hyperlipidemia   ? Hypertension   ? Internal hemorrhoids   ? Irregular cardiac rhythm 03/10/2018  ? Macular degeneration   ? Major depressive disorder 08/01/2012  ? Mild neurocognitive disorder due to Parkinson's disease ([place]) 12/27/2019  ? Obstructive sleep apnea 11/09/2018  ? Patient reports mild symptoms; was not prescribed a CPAP machine  ? Otitis externa 08/10/2012  ? PAF (paroxysmal atrial fibrillation)   ? Parkinson's disease 05/19/2019  ? Thrombocytopenia   ? TIA (transient ischemic attack) 03/10/2018  ? ? ?Past Surgical History:  ?Procedure Laterality Date  ? [place]    ? very young, b/l  ? EXCISIONAL HEMORRHOIDECTOMY    ? HYDROCELE EXCISION / REPAIR   2012  ? b/l  ? SKIN CANCER EXCISION    ? nose and forehead, basal cell CA  ? ? ?There were no vitals filed for this visit. ? ? Subjective Assessment - 01/23/22 1445   ? ? Subjective Denies any falls, has been going to gym once a week using strength machines   ? Pertinent History See Problem list   ? Patient Stated Goals Pt's goals for therapy are to be more stable walking.   ? Currently in Pain? No/denies   ? ?  ?  ? ?  ? ? ? ? ? ? ? ? ? ? ? ? ? ? ? ? ? ? ? ? [place] Adult PT Treatment/Exercise - 01/23/22 0001   ? ?  ? Ambulation/Gait  ? Ambulation/Gait Yes   ? Ambulation/Gait Assistance 5: Supervision   ? Ambulation Distance (Feet) 150 Feet   ? Assistive device Other (Comment)   trekking poles  ? Gait Pattern Step-through pattern;Decreased arm swing - right;Decreased arm swing - left;Decreased step length - right;Decreased step length - left;Decreased trunk rotation;Trunk flexed   ? Gait Comments gait with single trekking pole and cues for use of auditory feedback. Bilateral trekking poles, cues to push-through like skiing   ?  ? Knee/Hip Exercises: Aerobic  ? Recumbent Bike level 5  x 5 min for dynamic warm-up all extremeties   ?  ? Knee/Hip Exercises: Seated  ? Sit to Sand 3 sets;5 reps;without UE support   front squat 5# dumbell  ? ?  ?  ? ?  ? ? ? ? ? ? Balance Exercises - 01/23/22 0001   ? ?  ? Balance Exercises: Standing  ? Marching Solid surface;Upper extremity assist 1;20 reps   single trekking pole  ? Heel Raises Both;20 reps   ? Heel Raises Limitations arms extended in front for counter-balance   ? Other Standing Exercises Comments ant/pos weight shifts on firm ground with manual cueing at anterior hips to encourage anterior wt shift. Standing trunk rotations holding pole in horizontal 2x10 for rotation/reciprocal   ?  ? Balance Exercises: Seated  ? Dynamic Sitting Other (comment)   PWR! up sitting from EOM. Trunk twists 2x10 reps sitting EOM with horizontal pole held in front  ? ?  ?  ? ?  ? ? ? ? ? PT  Education - 01/23/22 1600   ? ? Education Details demonstration of YouTube seated PWR! Parkinson's videos for improved activity compliance at home   ? Person(s) Educated Patient   ? Methods Explanation   ? Comprehension Verbalized understanding   ? ?  ?  ? ?  ? ? ? PT Short Term Goals - 01/18/22 0926   ? ?  ? PT SHORT TERM GOAL #1  ? Title Pt will be independent with HEP for improved strength, balance, transfers, and gait.  TARGET 02/08/2022   ? Time 4   ? Period Weeks   ? Status On-going   ?  ? PT SHORT TERM GOAL #2  ? Title Pt will perform 5x sit<>stand with no posterior lean, no LOB, to demonstrate improved functional strength and transfer efficiency.   ? Time 4   ? Period Weeks   ? Status On-going   ?  ? PT SHORT TERM GOAL #3  ? Title Pt will improve TUG score to less than or equal to 13.5 sec for decreased fall risk.   ? Baseline 16 sec   ? Time 4   ? Period Weeks   ? Status On-going   ? ?  ?  ? ?  ? ? ? ? PT Long Term Goals - 01/18/22 0926   ? ?  ? PT LONG TERM GOAL #1  ? Title Pt will be independent with progression of HEP for improved strength, balance, transfers, and gait.  TARGET 02/22/2022   ? Time 6   ? Period Weeks   ? Status On-going   ?  ? PT LONG TERM GOAL #2  ? Title Pt will improve MiniBESTest score to at least 19/28 to decrease fall risk.   ? Baseline 15/28   ? Time 6   ? Period Weeks   ? Status On-going   ?  ? PT LONG TERM GOAL #3  ? Title Pt will improve TUG cognitive score to less than or equal to 15 sec for decreased fall risk.   ? Baseline 17.13 sec   ? Time 6   ? Period Weeks   ? Status On-going   ?  ? PT LONG TERM GOAL #4  ? Title Pt will verbalize understanding of fall prevention in home environment.   ? Time 6   ? Period Weeks   ? Status On-going   ? ?  ?  ? ?  ? ? ? ? ? ? ? ?  Plan - 01/23/22 1559   ? ? Clinical Impression Statement Demo tendency for retro-pulsion and backwards LOB with difficulty in progressing trunk flexion over BOS requiring frequent tactile cues for postural correction  and weight shifting. Deficits during turning and notable festinating and lateral LOB experienced, difficulty with dual tasking during gait drill e.g. walking and talking with decomposition of movement noted.  Improved gait pattern appreciated with bilateral trekking poles demonstrating increased stride length and decrease in freezing of gait and improved reciprocal movement. Demonstrated YouTube videos specific to SEATED PWR! Parkinson's activities to improve motor control and hopefully home-based compliance with exercise/activity.   ? Personal Factors and Comorbidities Comorbidity 3+   ? Comorbidities See Problem list   ? Examination-Activity Limitations Locomotion Level;Transfers;Stand   ? Examination-Participation Restrictions Community Activity;Other   community fitness  ? Stability/Clinical Decision Making Evolving/Moderate complexity   ? Rehab Potential Good   ? PT Frequency 2x / week   ? PT Duration 6 weeks   plus eval  ? PT Treatment/Interventions ADLs/Self Care Home Management;Gait training;Stair training;Functional mobility training;Therapeutic activities;Therapeutic exercise;Balance training;Neuromuscular re-education;Manual techniques;DME Instruction;Patient/family education   ? PT Next Visit Plan continue to work ankle, hip, step strategies, turning techniques, standing/sitting PWR! Moves; gait training with cues for upright posture, longer step length/heelstrike and arm swing.   ? PT Home Exercise Plan seated PWR!   ? Consulted and Agree with Plan of Care Patient   ? ?  ?  ? ?  ? ? ?Patient will benefit from skilled therapeutic intervention in order to improve the following deficits and impairments:  Abnormal gait, Difficulty walking, Decreased balance, Decreased mobility, Decreased strength, Postural dysfunction ? ?Visit Diagnosis: ?Unsteadiness on feet ? ?Other abnormalities of gait and mobility ? ?Abnormal posture ? ?Muscle weakness (generalized) ? ? ? ? ?Problem List ?Patient Active Problem List  ?  Diagnosis Date Noted  ? Hemoptysis 01/17/2022  ? ETD (Eustachian tube dysfunction), right 01/17/2022  ? Dysphagia 09/11/2021  ? Unsteady gait 09/11/2021  ? Insomnia 02/06/2021  ? Depression, recurrent ([place]) 0

## 2022-01-25 NOTE — Telephone Encounter (Signed)
Called and spoke with patient. He has been advised to hold Eliquis on 02/10/22. He is aware that I have already sent his instructions to him via MyChart and mailed a copy earlier this week. Pt verbalized understanding and had no concerns at the end of the call. ?

## 2022-01-28 ENCOUNTER — Ambulatory Visit: Payer: PPO | Admitting: Physical Therapy

## 2022-01-28 ENCOUNTER — Encounter: Payer: Self-pay | Admitting: Physical Therapy

## 2022-01-28 DIAGNOSIS — R2689 Other abnormalities of gait and mobility: Secondary | ICD-10-CM

## 2022-01-28 DIAGNOSIS — R293 Abnormal posture: Secondary | ICD-10-CM

## 2022-01-28 DIAGNOSIS — R2681 Unsteadiness on feet: Secondary | ICD-10-CM

## 2022-01-28 NOTE — Therapy (Signed)
?[facility] ?[address], [address] ?[place], [place], [address] ?Phone: [phone]   Fax:  [phone] ? ?Physical Therapy Treatment ? ?Patient Details  ?Name: Walter Brown ?MRN: [id number] ?Date of Birth: [birth year] ?Referring Provider (PT): Alonza Bogus, DO ? ? ?Encounter Date: 01/28/2022 ? ? PT End of Session - 01/28/22 1445   ? ? Visit Number 5   ? Number of Visits 13   ? Date for PT Re-Evaluation 02/22/22   ? Authorization Type [employer]   ? Progress Note Due on Visit 10   ? PT Start Time 6734   ? PT Stop Time 1530   ? PT Time Calculation (min) 44 min   ? Equipment Utilized During Treatment --   ? Activity Tolerance Patient tolerated treatment well   ? Behavior During Therapy [facility] for tasks assessed/performed   ? ?  ?  ? ?  ? ? ?Past Medical History:  ?Diagnosis Date  ? Anemia   ? mild  ? Arthritis 04/06/2017  ? Asthma   ? childhood  ? ASVD (arteriosclerotic vascular disease) 09/07/2018  ? Basal cell carcinoma   ? skin- on nose- basal cell (20 yrs ago) forehead 1 year ago  ? BPH (benign prostatic hyperplasia)   ? Bradycardia 06/28/2018  ? Cerumen impaction 08/10/2012  ? Chicken pox as child  ? Coronary artery calcification 11/23/2019  ? Elevated LFTs   ? [place] measles as a child  ? Hyperlipidemia   ? Hypertension   ? Internal hemorrhoids   ? Irregular cardiac rhythm 03/10/2018  ? Macular degeneration   ? Major depressive disorder 08/01/2012  ? Mild neurocognitive disorder due to Parkinson's disease ([place]) 12/27/2019  ? Obstructive sleep apnea 11/09/2018  ? Patient reports mild symptoms; was not prescribed a CPAP machine  ? Otitis externa 08/10/2012  ? PAF (paroxysmal atrial fibrillation)   ? Parkinson's disease 05/19/2019  ? Thrombocytopenia   ? TIA (transient ischemic attack) 03/10/2018  ? ? ?Past Surgical History:  ?Procedure Laterality Date  ? [place]    ? very young, b/l  ? EXCISIONAL HEMORRHOIDECTOMY    ? HYDROCELE EXCISION / REPAIR  2012  ?  b/l  ? SKIN CANCER EXCISION    ? nose and forehead, basal cell CA  ? ? ?There were no vitals filed for this visit. ? ? Subjective Assessment - 01/28/22 1449   ? ? Subjective Used the riding lawnmower today and felt some off-balance.  Haven't had time to do most of the exercises-had to take wife to ED   ? Pertinent History See Problem list   ? Patient Stated Goals Pt's goals for therapy are to be more stable walking.   ? Currently in Pain? No/denies   ? ?  ?  ? ?  ? ? ? ? ? ? ? ? ? ? ? ? ? ? ? ? ? ? ? ? [place] Adult PT Treatment/Exercise - 01/28/22 0001   ? ?  ? Ambulation/Gait  ? Ambulation/Gait Yes   ? Ambulation/Gait Assistance 5: Supervision   ? Ambulation Distance (Feet) 600 Feet   ? Assistive device Walter (Comment)   bilateral walking poles  ? Gait Pattern Step-through pattern;Decreased arm swing - right;Decreased arm swing - left;Decreased step length - right;Decreased step length - left;Decreased trunk rotation;Trunk flexed   ? Gait Comments Outdoor gait with bilateral walking poles.  Cues for increased step length (especially with LLE for foot clearance) and slowed pace to sequence reciprocal  pattern with poles.  Pt gets out of sequence quickly with decreased L step length and increased pace with steps, needs cues to slow pace and start again.   ?  ? Knee/Hip Exercises: Aerobic  ? Nustep Level 4, 6 minutes 4 extremities with cues to keep steps/min >65-70 for aerobic warmup   ? ?  ?  ? ?  ? ? ? ? ? ? Balance Exercises - 01/28/22 0001   ? ?  ? Balance Exercises: Standing  ? Retro Gait Upper extremity support;4 reps;Limitations   ? Retro Gait Limitations Forward/back walking at counter, cues for slowed pace in posterior direction and to flex at hips (to lessen retropulsion)   ? Marching Solid surface;Upper extremity assist 1;20 reps;Limitations   ? Marching Limitations Cues for widened BOS   ? Heel Raises --   ? Walter Standing Exercises Wide BOS gentle squats to upright stand, at counter support, x 10 reps, then  wide BOS lateral weigthtshift with same side reach, x 10 reps each side.  Pt with difficulty with weight shift to L side.  Stagger stance forward/back rocking at counter, 5 reps to simulate reaching tasks   ? Walter Standing Exercises Comments ant/pos weight shifts on firm ground with manual cueing at anterior hips to encourage anterior wt shift.   ? ?  ?  ? ?  ? ?Pt performs PWR! Moves in seated position x 5-10 reps ?  ?PWR! Up for improved posture ? ?PWR! Rock for improved weighshifting ? ?PWR! Twist for improved trunk rotation ? ?PWR! Step for improved step initiation  ? ?Pt with significant left and posterior lean with trunk rotation activity.  Max visual, verbal, tactile cues provided for sequence and technique. ? ? ? ? ? ? PT Short Term Goals - 01/18/22 0926   ? ?  ? PT SHORT TERM GOAL #1  ? Title Pt will be independent with HEP for improved strength, balance, transfers, and gait.  TARGET 02/08/2022   ? Time 4   ? Period Weeks   ? Status On-going   ?  ? PT SHORT TERM GOAL #2  ? Title Pt will perform 5x sit<>stand with no posterior lean, no LOB, to demonstrate improved functional strength and transfer efficiency.   ? Time 4   ? Period Weeks   ? Status On-going   ?  ? PT SHORT TERM GOAL #3  ? Title Pt will improve TUG score to less than or equal to 13.5 sec for decreased fall risk.   ? Baseline 16 sec   ? Time 4   ? Period Weeks   ? Status On-going   ? ?  ?  ? ?  ? ? ? ? PT Long Term Goals - 01/18/22 0926   ? ?  ? PT LONG TERM GOAL #1  ? Title Pt will be independent with progression of HEP for improved strength, balance, transfers, and gait.  TARGET 02/22/2022   ? Time 6   ? Period Weeks   ? Status On-going   ?  ? PT LONG TERM GOAL #2  ? Title Pt will improve MiniBESTest score to at least 19/28 to decrease fall risk.   ? Baseline 15/28   ? Time 6   ? Period Weeks   ? Status On-going   ?  ? PT LONG TERM GOAL #3  ? Title Pt will improve TUG cognitive score to less than or equal to 15 sec for decreased fall risk.   ?  Baseline 17.13 sec   ? Time 6   ? Period Weeks   ? Status On-going   ?  ? PT LONG TERM GOAL #4  ? Title Pt will verbalize understanding of fall prevention in home environment.   ? Time 6   ? Period Weeks   ? Status On-going   ? ?  ?  ? ?  ? ? ? ? ? ? ? ? Plan - 01/28/22 1445   ? ? Clinical Impression Statement Continued seated PWR! Moves and standing balance exercises today as well as gait training with bilateral walking poles.  With seated PWR! Moves activities, he demonstrates significant left and posterior lean (with decreased postural awareness, needed PT cues to recover to midline) and with standing weigthshfiting exercises, he demonstrates decreased weightshifting to L side.  He has difficulty with reciprocal sequencing of walking poles, doing best with slowed pace, cues for increased L step length and foot clearance, but quickly reverts to his more typical pattern of decreased L foot clearance, smaller, quicker steps which gets pt out of rhythm wtih poles.  He will benefit from continued skilled PT to further address posture, balance, gait for improved overall functional mobility and decreased fall risk.   ? Personal Factors and Comorbidities Comorbidity 3+   ? Comorbidities See Problem list   ? Examination-Activity Limitations Locomotion Level;Transfers;Stand   ? Examination-Participation Restrictions Community Activity;Walter   community fitness  ? Stability/Clinical Decision Making Evolving/Moderate complexity   ? Rehab Potential Good   ? PT Frequency 2x / week   ? PT Duration 6 weeks   plus eval  ? PT Treatment/Interventions ADLs/Self Care Home Management;Gait training;Stair training;Functional mobility training;Therapeutic activities;Therapeutic exercise;Balance training;Neuromuscular re-education;Manual techniques;DME Instruction;Patient/family education   ? PT Next Visit Plan Pt cancelled next appt this week; next appt is 02/04/22.  Will need to assess STGs next week and discuss POC/further visits.   Cotninue to work on pacing with gait, transitions, turns, balance strategies.   ? PT Home Exercise Plan seated PWR!   ? Consulted and Agree with Plan of Care Patient   ? ?  ?  ? ?  ? ? ?Patient will benefit f

## 2022-01-30 ENCOUNTER — Ambulatory Visit: Payer: PPO | Admitting: Physical Therapy

## 2022-01-31 DIAGNOSIS — H353112 Nonexudative age-related macular degeneration, right eye, intermediate dry stage: Secondary | ICD-10-CM | POA: Diagnosis not present

## 2022-01-31 DIAGNOSIS — H353221 Exudative age-related macular degeneration, left eye, with active choroidal neovascularization: Secondary | ICD-10-CM | POA: Diagnosis not present

## 2022-01-31 DIAGNOSIS — H43813 Vitreous degeneration, bilateral: Secondary | ICD-10-CM | POA: Diagnosis not present

## 2022-01-31 DIAGNOSIS — H35372 Puckering of macula, left eye: Secondary | ICD-10-CM | POA: Diagnosis not present

## 2022-02-03 ENCOUNTER — Encounter: Payer: Self-pay | Admitting: Certified Registered Nurse Anesthetist

## 2022-02-04 ENCOUNTER — Encounter: Payer: Self-pay | Admitting: Physical Therapy

## 2022-02-04 ENCOUNTER — Ambulatory Visit: Payer: PPO | Attending: Neurology | Admitting: Physical Therapy

## 2022-02-04 DIAGNOSIS — R2681 Unsteadiness on feet: Secondary | ICD-10-CM | POA: Diagnosis not present

## 2022-02-04 DIAGNOSIS — R2689 Other abnormalities of gait and mobility: Secondary | ICD-10-CM | POA: Diagnosis not present

## 2022-02-04 DIAGNOSIS — R293 Abnormal posture: Secondary | ICD-10-CM | POA: Diagnosis not present

## 2022-02-04 DIAGNOSIS — M6281 Muscle weakness (generalized): Secondary | ICD-10-CM

## 2022-02-04 NOTE — Therapy (Signed)
[place] ?[facility] ?[address], [address] ?[place], [place], [address] ?Phone: [phone]   Fax:  [phone] ? ?Physical Therapy Treatment ? ?Patient Details  ?Name: Walter Brown ?MRN: [id number] ?Date of Birth: 04/04/1941 ?Referring Provider (PT): Alonza Bogus, DO ? ? ?Encounter Date: 02/04/2022 ? ? PT End of Session - 02/04/22 1715   ? ? Visit Number 6   ? Number of Visits 13   ? Date for PT Re-Evaluation 02/22/22   ? Authorization Type [employer]   ? Progress Note Due on Visit 10   ? PT Start Time 1194   ? PT Stop Time 1530   ? PT Time Calculation (min) 45 min   ? Activity Tolerance Patient tolerated treatment well   ? Behavior During Therapy [facility] for tasks assessed/performed   ? ?  ?  ? ?  ? ? ?Past Medical History:  ?Diagnosis Date  ? Anemia   ? mild  ? Arthritis 04/06/2017  ? Asthma   ? childhood  ? ASVD (arteriosclerotic vascular disease) 09/07/2018  ? Basal cell carcinoma   ? skin- on nose- basal cell (20 yrs ago) forehead 1 year ago  ? BPH (benign prostatic hyperplasia)   ? Bradycardia 06/28/2018  ? Cerumen impaction 08/10/2012  ? Chicken pox as child  ? Coronary artery calcification 11/23/2019  ? Elevated LFTs   ? [place] measles as a child  ? Hyperlipidemia   ? Hypertension   ? Internal hemorrhoids   ? Irregular cardiac rhythm 03/10/2018  ? Macular degeneration   ? Major depressive disorder 08/01/2012  ? Mild neurocognitive disorder due to Parkinson's disease ([place]) 12/27/2019  ? Obstructive sleep apnea 11/09/2018  ? Patient reports mild symptoms; was not prescribed a CPAP machine  ? Otitis externa 08/10/2012  ? PAF (paroxysmal atrial fibrillation)   ? Parkinson's disease 05/19/2019  ? Thrombocytopenia   ? TIA (transient ischemic attack) 03/10/2018  ? ? ?Past Surgical History:  ?Procedure Laterality Date  ? [place]    ? very young, b/l  ? EXCISIONAL HEMORRHOIDECTOMY    ? HYDROCELE EXCISION / REPAIR  2012  ? b/l  ? SKIN CANCER EXCISION    ? nose and  forehead, basal cell CA  ? ? ?There were no vitals filed for this visit. ? ? Subjective Assessment - 02/04/22 1445   ? ? Subjective Felt more unsteady than usual yesterday.  No falls.  Feel like I lose my balance to the right.  Feel like I could do most of this stuff at the gym.   ? Pertinent History See Problem list   ? Patient Stated Goals Pt's goals for therapy are to be more stable walking.   ? Currently in Pain? No/denies   ? ?  ?  ? ?  ? ? ? ? ? ? ? ? ? ? ? ? ? ? ? ? ? ? ? ? [place] Adult PT Treatment/Exercise - 02/04/22 0001   ? ?  ? Transfers  ? Transfers Sit to Stand;Stand to Sit   ? Sit to Stand 5: Supervision;Without upper extremity assist;From chair/3-in-1   ? Five time sit to stand comments  13.04   with retropulsion x 2 reps  ? Stand to Sit 5: Supervision;Without upper extremity assist;To chair/3-in-1;Uncontrolled descent   ? Comments Additional 10 reps of sit<>stand, cue for "nose over toes", hinge at hips to   ?  ? Standardized Balance Assessment  ? Standardized Balance Assessment Timed Up and Go  Test   ?  ? Timed Up and Go Test  ? TUG Normal TUG   ? Normal TUG (seconds) 14.43   ?  ? High Level Balance  ? High Level Balance Activities Figure 8 turns;Other (comment)   ? High Level Balance Comments Practiced turns with with wide BOS, cues to stay tall through R hip and RLE with gait and turn.  Worked on wide U-turns, quarter turns R and L, cues for tall activation through hips.   ?  ? Neuro Re-ed   ? Neuro Re-ed Details  Sidestepping along counter, 3 reps R and L with cues for increased step length. (Count fewer steps and stand tall); Resisted sidestepping R and L x 3 reps.  No resistance:  monster walk forward, 6 reps along counter, with cues for tall posture (pt sinks into hip/knee flexion throughout, needs continued cues)   ? ?  ?  ? ?  ? ? ?Lateral weigthshifting x 10 reps, then lateral weightshift with same side reach (with tactile and verbal cues), then lateral weightshift and reach with opposite  leg lift to address hip stability/balance, with pt experiencing near-LOB several times, needing cues for UE support. ? ?Side step and weightshift x 10 reps each side with UE support, reminder cues to task.  Then progressed to quarter turns (see above) ? ?Pt needs tactile, visual, verbal cues throughout sets of exercise, often stopping exercise early or transitioning into different movement pattern without warning or cues from PT.  He says "I just forgot what I was doing." ? ? ? ? ? ? ? PT Education - 02/04/22 1527   ? ? Education Details Additions to HEP-see instructions   ? Person(s) Educated Patient   ? Methods Explanation;Demonstration;Handout   ? Comprehension Verbalized understanding   ? ?  ?  ? ?  ? ? ? PT Short Term Goals - 02/04/22 1716   ? ?  ? PT SHORT TERM GOAL #1  ? Title Pt will be independent with HEP for improved strength, balance, transfers, and gait.  TARGET 02/08/2022   ? Baseline Needed cues last visit for HEP   ? Time 4   ? Period Weeks   ? Status Not Met   ?  ? PT SHORT TERM GOAL #2  ? Title Pt will perform 5x sit<>stand with no posterior lean, no LOB, to demonstrate improved functional strength and transfer efficiency.   ? Baseline posterior lean x 2 reps   ? Time 4   ? Period Weeks   ? Status Not Met   ?  ? PT SHORT TERM GOAL #3  ? Title Pt will improve TUG score to less than or equal to 13.5 sec for decreased fall risk.   ? Baseline 16 sec, 14.44 sec   ? Time 4   ? Period Weeks   ? Status Partially Met   ? ?  ?  ? ?  ? ? ? ? PT Long Term Goals - 01/18/22 0926   ? ?  ? PT LONG TERM GOAL #1  ? Title Pt will be independent with progression of HEP for improved strength, balance, transfers, and gait.  TARGET 02/22/2022   ? Time 6   ? Period Weeks   ? Status On-going   ?  ? PT LONG TERM GOAL #2  ? Title Pt will improve MiniBESTest score to at least 19/28 to decrease fall risk.   ? Baseline 15/28   ? Time 6   ? Period  Weeks   ? Status On-going   ?  ? PT LONG TERM GOAL #3  ? Title Pt will improve TUG  cognitive score to less than or equal to 15 sec for decreased fall risk.   ? Baseline 17.13 sec   ? Time 6   ? Period Weeks   ? Status On-going   ?  ? PT LONG TERM GOAL #4  ? Title Pt will verbalize understanding of fall prevention in home environment.   ? Time 6   ? Period Weeks   ? Status On-going   ? ?  ?  ? ?  ? ? ? ? ? ? ? ? Plan - 02/04/22 1716   ? ? Clinical Impression Statement Assessed STGs this visit, with pt partially meeting STG 3 for improved TUG score.  Pt has improved TUG score, just not to goal level.  Pt continues to demonstrate decreased anterior/forward lean with sit<>stand, with retropulsion without cues from PT.  He improves with cues.  He continues to demo decreased body/postural awareness with decreased activation through gluts/hips for upright posture, leading to LOB without assist of UE  support of PT.  He will continue to bneeift from skilled PT towards goals for improved overall funcitonal mobility.   ? PT Frequency 2x / week   ? PT Duration 6 weeks   ? PT Treatment/Interventions ADLs/Self Care Home Management;Gait training;Stair training;Functional mobility training;Therapeutic activities;Therapeutic exercise;Balance training;Neuromuscular re-education;Manual techniques;DME Instruction;Patient/family education   ? PT Next Visit Plan REview additions to HEP today; work towards [place].  Ask if pt wants to continue beyond next (last scheduled) visit.   ? Consulted and Agree with Plan of Care Patient   ? ?  ?  ? ?  ? ? ?Patient will benefit from skilled therapeutic intervention in order to improve the following deficits and impairments:  Abnormal gait, Difficulty walking, Decreased balance, Decreased mobility, Decreased strength, Postural dysfunction ? ?Visit Diagnosis: ?Unsteadiness on feet ? ?Abnormal posture ? ?Muscle weakness (generalized) ? ?Other abnormalities of gait and mobility ? ? ? ? ?Problem List ?Patient Active Problem List  ? Diagnosis Date Noted  ? Hemoptysis 01/17/2022  ? ETD  (Eustachian tube dysfunction), right 01/17/2022  ? Dysphagia 09/11/2021  ? Unsteady gait 09/11/2021  ? Insomnia 02/06/2021  ? Depression, recurrent ([place]) 04/25/2020  ? Hyperglycemia 04/25/2020  ? Mild neurocogn

## 2022-02-04 NOTE — Patient Instructions (Signed)
Access Code: [email or web address] ?URL: [email or web address] ?Date: 02/04/2022 ?Prepared by: [facility] ? ?Exercises ?- Heel Toe Raises with Counter Support  - 1-2 x daily - 5 x weekly - 2 sets - 10 reps - 3 sec hold ?- Mini Squat with Counter Support  - 1 x daily - 5 x weekly - 2 sets - 10 reps - 3 sec hold ?- Forward Step Over with Counter Support  - 1 x daily - 5 x weekly - 1-2 sets - 10 reps ? ? ?Added to HEP 02/04/2022 ?- Side Stepping with Resistance at Ankles and Counter Support  - 1 x daily - 5 x weekly - 1 sets - 3-5 reps ?- Standing Quarter Turn with Counter Support  - 1 x daily - 5 x weekly - 1 sets - 3-5 reps ?- Figure-8 Walking Around [employer]  - 1 x daily - 5 x weekly - 1 sets - 3-5 reps ?

## 2022-02-06 ENCOUNTER — Ambulatory Visit: Payer: PPO | Admitting: Physical Therapy

## 2022-02-06 ENCOUNTER — Encounter: Payer: Self-pay | Admitting: Physical Therapy

## 2022-02-06 DIAGNOSIS — R293 Abnormal posture: Secondary | ICD-10-CM

## 2022-02-06 DIAGNOSIS — M6281 Muscle weakness (generalized): Secondary | ICD-10-CM

## 2022-02-06 DIAGNOSIS — R2681 Unsteadiness on feet: Secondary | ICD-10-CM | POA: Diagnosis not present

## 2022-02-06 DIAGNOSIS — R2689 Other abnormalities of gait and mobility: Secondary | ICD-10-CM

## 2022-02-06 NOTE — Therapy (Signed)
[place] ?[facility] ?[address], [address] ?[place], [place], [address] ?Phone: [phone]   Fax:  [phone] ? ?Physical Therapy Treatment ? ?Patient Details  ?Name: Walter Brown ?MRN: [id number] ?Date of Birth: September 05, 1941 ?Referring Provider (PT): Alonza Bogus, DO ? ? ?Encounter Date: 02/06/2022 ? ? PT End of Session - 02/06/22 1447   ? ? Visit Number 7   ? Number of Visits 13   ? Date for PT Re-Evaluation 02/22/22   ? Authorization Type [employer]   ? Progress Note Due on Visit 10   ? PT Start Time 2244   ? PT Stop Time 9753   ? PT Time Calculation (min) 38 min   ? Activity Tolerance Patient tolerated treatment well   ? Behavior During Therapy [facility] for tasks assessed/performed   ? ?  ?  ? ?  ? ? ?Past Medical History:  ?Diagnosis Date  ? Anemia   ? mild  ? Arthritis 04/06/2017  ? Asthma   ? childhood  ? ASVD (arteriosclerotic vascular disease) 09/07/2018  ? Basal cell carcinoma   ? skin- on nose- basal cell (20 yrs ago) forehead 1 year ago  ? BPH (benign prostatic hyperplasia)   ? Bradycardia 06/28/2018  ? Cerumen impaction 08/10/2012  ? Chicken pox as child  ? Coronary artery calcification 11/23/2019  ? Elevated LFTs   ? [place] measles as a child  ? Hyperlipidemia   ? Hypertension   ? Internal hemorrhoids   ? Irregular cardiac rhythm 03/10/2018  ? Macular degeneration   ? Major depressive disorder 08/01/2012  ? Mild neurocognitive disorder due to Parkinson's disease ([place]) 12/27/2019  ? Obstructive sleep apnea 11/09/2018  ? Patient reports mild symptoms; was not prescribed a CPAP machine  ? Otitis externa 08/10/2012  ? PAF (paroxysmal atrial fibrillation)   ? Parkinson's disease 05/19/2019  ? Thrombocytopenia   ? TIA (transient ischemic attack) 03/10/2018  ? ? ?Past Surgical History:  ?Procedure Laterality Date  ? [place]    ? very young, b/l  ? EXCISIONAL HEMORRHOIDECTOMY    ? HYDROCELE EXCISION / REPAIR  2012  ? b/l  ? SKIN CANCER EXCISION    ? nose and  forehead, basal cell CA  ? ? ?There were no vitals filed for this visit. ? ? Subjective Assessment - 02/06/22 1450   ? ? Subjective Don't knoe if it was an a-fib event or Parkinson's, but my legs got really weak.  Lowered myself to the ground.   ? Pertinent History See Problem list   ? Patient Stated Goals Pt's goals for therapy are to be more stable walking.   ? ?  ?  ? ?  ? ? ? ? ? ?Access Code: [email or web address] ?URL: [email or web address] ?Date: 02/06/2022 ?Prepared by: [facility] ? ?Exercises-Reviewed full HEP and educated pt in benefit of consistent HEP performance ?- Heel Toe Raises with Counter Support  - 1-2 x daily - 5 x weekly - 2 sets - 10 reps - 3 sec hold ?- Mini Squat with Counter Support  - 1 x daily - 5 x weekly - 2 sets - 10 reps - 3 sec hold ?- Forward Step Over with Counter Support  - 1 x daily - 5 x weekly - 1-2 sets - 10 reps ?- Side Stepping with Resistance at Ankles and Counter Support  - 1 x daily - 5 x weekly - 1 sets - 3-5 reps ?- Standing  Quarter Turn with Counter Support  - 1 x daily - 5 x weekly - 1 sets - 3-5 reps ?- Figure-8 Walking Around [employer]  - 1 x daily - 5 x weekly - 1 sets - 3-5 reps ? ? ? ? ? ? ? ? ? ? ? ? ? ? ? ? [place] Adult PT Treatment/Exercise - 02/06/22 0001   ? ?  ? Transfers  ? Comments Cues to fully turn to sit for optimal safety with transfers   ?  ? Ambulation/Gait  ? Ambulation/Gait Yes   ? Ambulation/Gait Assistance 6: Modified independent (Device/Increase time)   ? Ambulation Distance (Feet) 340 Feet   40 ft x 6 reps with turns, using walking pole  ? Assistive device Other (Comment)   single walking pole  ? Gait Pattern Step-through pattern;Decreased arm swing - right;Decreased arm swing - left;Decreased step length - right;Decreased step length - left;Decreased trunk rotation;Trunk flexed   ? Ambulation Surface Level;Indoor   ? Gait Comments Cues that if patient feels shorter, smaller steps, to STOP and reset with large  steps.   ?  ? Self-Care  ? Self-Care Other Self-Care Comments   ? Other Self-Care Comments  Discussed POC and future appointments; pt wants to hold on PT for now to focus on strengthening and aerobic activities at gym.  Reviewed full HEP today and educated pt in benefits of consistent HEP performance to address some of his balance deficits.  Discussed holding PT with planned return in 4 weeks to reassess balance, strength measures and decide on plan for further PT at that point.   ? ?  ?  ? ?  ? ? ? ? ? ? ? ? ? ? PT Education - 02/06/22 1459   ? ? Education Details Education on fall prevention education in home environment, importance of HEP performance; POC   ? Person(s) Educated Patient   ? Methods Explanation;Handout   ? Comprehension Verbalized understanding   ? ?  ?  ? ?  ? ? ? PT Short Term Goals - 02/04/22 1716   ? ?  ? PT SHORT TERM GOAL #1  ? Title Pt will be independent with HEP for improved strength, balance, transfers, and gait.  TARGET 02/08/2022   ? Baseline Needed cues last visit for HEP   ? Time 4   ? Period Weeks   ? Status Not Met   ?  ? PT SHORT TERM GOAL #2  ? Title Pt will perform 5x sit<>stand with no posterior lean, no LOB, to demonstrate improved functional strength and transfer efficiency.   ? Baseline posterior lean x 2 reps   ? Time 4   ? Period Weeks   ? Status Not Met   ?  ? PT SHORT TERM GOAL #3  ? Title Pt will improve TUG score to less than or equal to 13.5 sec for decreased fall risk.   ? Baseline 16 sec, 14.44 sec   ? Time 4   ? Period Weeks   ? Status Partially Met   ? ?  ?  ? ?  ? ? ? ? PT Long Term Goals - 01/18/22 0926   ? ?  ? PT LONG TERM GOAL #1  ? Title Pt will be independent with progression of HEP for improved strength, balance, transfers, and gait.  TARGET 02/22/2022   ? Time 6   ? Period Weeks   ? Status On-going   ?  ? PT LONG TERM GOAL #2  ?  Title Pt will improve MiniBESTest score to at least 19/28 to decrease fall risk.   ? Baseline 15/28   ? Time 6   ? Period Weeks   ?  Status On-going   ?  ? PT LONG TERM GOAL #3  ? Title Pt will improve TUG cognitive score to less than or equal to 15 sec for decreased fall risk.   ? Baseline 17.13 sec   ? Time 6   ? Period Weeks   ? Status On-going   ?  ? PT LONG TERM GOAL #4  ? Title Pt will verbalize understanding of fall prevention in home environment.   ? Time 6   ? Period Weeks   ? Status On-going   ? ?  ?  ? ?  ? ? ? ? ? ? ? ? Plan - 02/06/22 1542   ? ? Clinical Impression Statement Reassessed HEP additions and full exercises as part of HEP today.  Pt reports not consistently doing at home; educated in importance of consistent HEP performance.  Pt feels he wants to address strengthening activities at gym on his own, despite continued balance and postural awareness deficits.  Pt does appear to have improved movement patterns with gait, turns today.  Plan is for pt to be on hold for PT x 4 weeks and return at that time for reassessment/recert to determine further PT needs.   ? PT Frequency 2x / week   ? PT Duration 6 weeks   ? PT Treatment/Interventions ADLs/Self Care Home Management;Gait training;Stair training;Functional mobility training;Therapeutic activities;Therapeutic exercise;Balance training;Neuromuscular re-education;Manual techniques;DME Instruction;Patient/family education   ? PT Next Visit Plan Assess LTGs, recert/decide continue PT or discharge.  Encourage patient to follow up with MD/neurologist if he has continued weakness in legs.   ? Consulted and Agree with Plan of Care Patient   ? ?  ?  ? ?  ? ? ?Patient will benefit from skilled therapeutic intervention in order to improve the following deficits and impairments:  Abnormal gait, Difficulty walking, Decreased balance, Decreased mobility, Decreased strength, Postural dysfunction ? ?Visit Diagnosis: ?Unsteadiness on feet ? ?Abnormal posture ? ?Other abnormalities of gait and mobility ? ?Muscle weakness (generalized) ? ? ? ? ?Problem List ?Patient Active Problem List  ?  Diagnosis Date Noted  ? Hemoptysis 01/17/2022  ? ETD (Eustachian tube dysfunction), right 01/17/2022  ? Dysphagia 09/11/2021  ? Unsteady gait 09/11/2021  ? Insomnia 02/06/2021  ? Depression, recurrent ([place]) 07/20

## 2022-02-06 NOTE — Patient Instructions (Signed)

## 2022-02-11 ENCOUNTER — Ambulatory Visit (AMBULATORY_SURGERY_CENTER): Payer: PPO | Admitting: Internal Medicine

## 2022-02-11 ENCOUNTER — Encounter: Payer: Self-pay | Admitting: Family Medicine

## 2022-02-11 ENCOUNTER — Encounter: Payer: Self-pay | Admitting: Internal Medicine

## 2022-02-11 ENCOUNTER — Telehealth: Payer: Self-pay | Admitting: *Deleted

## 2022-02-11 VITALS — BP 162/90 | HR 52 | Temp 98.4°F | Resp 19 | Ht 65.0 in | Wt 171.0 lb

## 2022-02-11 DIAGNOSIS — R1319 Other dysphagia: Secondary | ICD-10-CM | POA: Diagnosis not present

## 2022-02-11 DIAGNOSIS — R1310 Dysphagia, unspecified: Secondary | ICD-10-CM | POA: Diagnosis not present

## 2022-02-11 DIAGNOSIS — K2970 Gastritis, unspecified, without bleeding: Secondary | ICD-10-CM

## 2022-02-11 DIAGNOSIS — K3189 Other diseases of stomach and duodenum: Secondary | ICD-10-CM | POA: Diagnosis not present

## 2022-02-11 DIAGNOSIS — D696 Thrombocytopenia, unspecified: Secondary | ICD-10-CM

## 2022-02-11 DIAGNOSIS — G4733 Obstructive sleep apnea (adult) (pediatric): Secondary | ICD-10-CM | POA: Diagnosis not present

## 2022-02-11 DIAGNOSIS — K259 Gastric ulcer, unspecified as acute or chronic, without hemorrhage or perforation: Secondary | ICD-10-CM | POA: Diagnosis not present

## 2022-02-11 DIAGNOSIS — R933 Abnormal findings on diagnostic imaging of other parts of digestive tract: Secondary | ICD-10-CM

## 2022-02-11 MED ORDER — SODIUM CHLORIDE 0.9 % IV SOLN: 500.0000 mL | Freq: Once | INTRAVENOUS | Status: DC

## 2022-02-11 MED ORDER — PANTOPRAZOLE SODIUM 40 MG PO TBEC: 40.0000 mg | DELAYED_RELEASE_TABLET | Freq: Every day | ORAL | 0 refills | Status: DC

## 2022-02-11 NOTE — Progress Notes (Signed)
Called to room to assist during endoscopic procedure.  Patient ID and intended procedure confirmed with present staff. Received instructions for my participation in the procedure from the performing physician.  

## 2022-02-11 NOTE — Telephone Encounter (Signed)
Pt has lab appointment this Friday but I do not see any future orders in Epic.  Please place future orders if appropriate or call pt to cancel if labs not needed at this time. ?

## 2022-02-11 NOTE — Progress Notes (Signed)
? ?GASTROENTEROLOGY PROCEDURE H&P NOTE  ? ?Primary Care Physician: ?[name], MD ? ? ? ?Reason for Procedure:   Dysphagia/abnl barium esophagram  ? ?Plan:    EGD with possible dilation ? ?Patient is appropriate for endoscopic procedure(s) in the ambulatory ([place]) setting. ? ?The nature of the procedure, as well as the risks, benefits, and alternatives were carefully and thoroughly reviewed with the patient. Ample time for discussion and questions allowed. The patient understood, was satisfied, and agreed to proceed.  ? ?Eliquis on hold x 2 days. ? ? ? ?HPI: ?Walter Brown is a 81 y.o. male who presents for EGD.  Medical history as below.   No recent chest pain or shortness of breath.  No abdominal pain today. ? ?Past Medical History:  ?Diagnosis Date  ? Allergy   ? Anemia   ? mild  ? Anxiety   ? Arthritis 04/06/2017  ? Asthma   ? childhood  ? ASVD (arteriosclerotic vascular disease) 09/07/2018  ? Basal cell carcinoma   ? skin- on nose- basal cell (20 yrs ago) forehead 1 year ago  ? BPH (benign prostatic hyperplasia)   ? Bradycardia 06/28/2018  ? Cerumen impaction 08/10/2012  ? Chicken pox as child  ? Coronary artery calcification 11/23/2019  ? Elevated LFTs   ? [place] measles as a child  ? Hyperlipidemia   ? Hypertension   ? Internal hemorrhoids   ? Irregular cardiac rhythm 03/10/2018  ? Macular degeneration   ? Major depressive disorder 08/01/2012  ? Mild neurocognitive disorder due to Parkinson's disease ([place]) 12/27/2019  ? Obstructive sleep apnea 11/09/2018  ? Patient reports mild symptoms; was not prescribed a CPAP machine  ? Otitis externa 08/10/2012  ? PAF (paroxysmal atrial fibrillation)   ? Parkinson's disease 05/19/2019  ? Thrombocytopenia   ? TIA (transient ischemic attack) 03/10/2018  ? ? ?Past Surgical History:  ?Procedure Laterality Date  ? [place]    ? very young, b/l  ? EXCISIONAL HEMORRHOIDECTOMY    ? HYDROCELE EXCISION / REPAIR  2012  ? b/l  ? SKIN CANCER EXCISION    ? nose  and forehead, basal cell CA  ? UPPER GASTROINTESTINAL ENDOSCOPY    ? ? ?Prior to Admission medications   ?Medication Sig Start Date End Date Taking? Authorizing Provider  ?amiodarone (PACERONE) 200 MG tablet Take 1 tablet (200 mg total) by mouth daily. 11/20/21  Yes Lorretta Harp, MD  ?apixaban (ELIQUIS) 5 MG TABS tablet TAKE 1 TABLET BY MOUTH TWICE A DAY 10/19/21  Yes Revankar, Reita Cliche, MD  ?carbidopa-levodopa (SINEMET CR) 50-200 MG tablet Take 1 tablet by mouth at bedtime. 01/20/22  Yes [provider]  ?carbidopa-levodopa (SINEMET IR) 25-100 MG tablet TAKE 1 TABLET BY MOUTH 3 (THREE) TIMES DAILY. 7AM/11AM/4PM 11/05/21  Yes Tat, Eustace Quail, DO  ?hydrochlorothiazide (HYDRODIURIL) 25 MG tablet TAKE 1 TABLET BY MOUTH EVERY DAY 10/25/21  Yes [name], MD  ?Melatonin 10 MG TABS Take 10 mg by mouth daily as needed.   Yes [provider]  ?Multiple Vitamins-Minerals (ICAPS AREDS 2 PO) Take 1 capsule by mouth in the morning and at bedtime.   Yes [provider]  ?sertraline (ZOLOFT) 100 MG tablet TAKE 1 TABLET BY MOUTH EVERY DAY 09/05/21  Yes [name], MD  ?terazosin (HYTRIN) 10 MG capsule Take 10 mg by mouth at bedtime.    Yes [provider]  ?rosuvastatin (CRESTOR) 5 MG tablet Take 1 tablet (5 mg total) by mouth  daily. ?Patient not taking: Reported on 02/11/2022 12/11/21   Lorretta Harp, MD  ? ? ?Current Outpatient Medications  ?Medication Sig Dispense Refill  ? amiodarone (PACERONE) 200 MG tablet Take 1 tablet (200 mg total) by mouth daily. 90 tablet 0  ? apixaban (ELIQUIS) 5 MG TABS tablet TAKE 1 TABLET BY MOUTH TWICE A DAY 180 tablet 1  ? carbidopa-levodopa (SINEMET CR) 50-200 MG tablet Take 1 tablet by mouth at bedtime.    ? carbidopa-levodopa (SINEMET IR) 25-100 MG tablet TAKE 1 TABLET BY MOUTH 3 (THREE) TIMES DAILY. 7AM/11AM/4PM 270 tablet 1  ? hydrochlorothiazide (HYDRODIURIL) 25 MG tablet TAKE 1 TABLET BY MOUTH EVERY DAY 90 tablet 1  ? Melatonin 10 MG TABS Take 10  mg by mouth daily as needed.    ? Multiple Vitamins-Minerals (ICAPS AREDS 2 PO) Take 1 capsule by mouth in the morning and at bedtime.    ? sertraline (ZOLOFT) 100 MG tablet TAKE 1 TABLET BY MOUTH EVERY DAY 90 tablet 1  ? terazosin (HYTRIN) 10 MG capsule Take 10 mg by mouth at bedtime.     ? rosuvastatin (CRESTOR) 5 MG tablet Take 1 tablet (5 mg total) by mouth daily. (Patient not taking: Reported on 02/11/2022) 90 tablet 4  ? ?Current Facility-Administered Medications  ?Medication Dose Route Frequency Provider Last Rate Last Admin  ? 0.9 %  sodium chloride infusion  500 mL Intravenous Once Lougenia Morrissey, Lajuan Lines, MD      ? ? ?Allergies as of 02/11/2022 - Review Complete 02/11/2022  ?Allergen Reaction Noted  ? Albuterol sulfate Palpitations 08/04/2018  ? Azithromycin Other (See Comments) 12/08/2017  ? Doxycycline hyclate Other (See Comments) 12/09/2017  ? ? ?Family History  ?Problem Relation Age of Onset  ? Hypertension Mother   ? Other Mother   ?     MRSA  ? Heart disease Mother   ?     stents  ? Heart disease Father   ? Hypertension Father   ? Prostate cancer Father 57  ? Parkinson's disease Father   ? Stroke Maternal Grandmother   ? Prostate cancer Maternal Grandfather   ? Cancer Paternal Grandfather   ?     EYE  ? ADD / ADHD Son   ?     ADHD  ? Other Son 24  ?     part of 1 lung removed- due to infection  ? Colon cancer Neg Hx   ? Esophageal cancer Neg Hx   ? Rectal cancer Neg Hx   ? Stomach cancer Neg Hx   ? ? ?Social History  ? ?Socioeconomic History  ? Marital status: Married  ?  Spouse name: Not on file  ? Number of children: 2  ? Years of education: 36  ? Highest education level: Bachelor's degree (e.g., BA, AB, BS)  ?Occupational History  ? Occupation: retired  ?Tobacco Use  ? Smoking status: Former  ?  Packs/day: 1.50  ?  Years: 15.00  ?  Pack years: 22.50  ?  Types: Cigarettes  ?  Start date: 10/07/1978  ? Smokeless tobacco: Never  ?Vaping Use  ? Vaping Use: Never used  ?Substance and Sexual Activity  ? Alcohol use:  Yes  ?  Alcohol/week: 7.0 - 14.0 standard drinks  ?  Types: 7 - 14 Glasses of wine per week  ?  Comment: 1-2 glasses of wine with dinner  ? Drug use: No  ? Sexual activity: Yes  ?  Comment: lives with wife, retired from  operations management in [employer] , 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  ?Other Topics Concern  ? Not on file  ?Social History Narrative  ? Right handed   ? ?Social Determinants of Health  ? ?Financial Resource Strain: Low Risk   ? Difficulty of Paying Living Expenses: Not very hard  ?Food Insecurity: No Food Insecurity  ? Worried About Charity fundraiser in the Last Year: Never true  ? Ran Out of Food in the Last Year: Never true  ?Transportation Needs: No Transportation Needs  ? Lack of Transportation (Medical): No  ? Lack of Transportation (Non-Medical): No  ?Physical Activity: Sufficiently Active  ? Days of Exercise per Week: 5 days  ? Minutes of Exercise per Session: 30 min  ?Stress: No Stress Concern Present  ? Feeling of Stress : Not at all  ?Social Connections: Moderately Integrated  ? Frequency of Communication with Friends and Family: More than three times a week  ? Frequency of Social Gatherings with Friends and Family: Once a week  ? Attends Religious Services: 1 to 4 times per year  ? Active Member of Clubs or Organizations: No  ? Attends Archivist Meetings: Never  ? Marital Status: Married  ?Intimate Partner Violence: Not At Risk  ? Fear of Current or Ex-Partner: No  ? Emotionally Abused: No  ? Physically Abused: No  ? Sexually Abused: No  ? ? ?Physical Exam: ?Vital signs in last 24 hours: ?'@BP'$  (!) 154/85   Pulse (!) 55   Temp 98.4 ?F (36.9 ?C) (Temporal)   Ht '5\' 5"'$  (1.651 m)   Wt 171 lb (77.6 kg)   SpO2 97%   BMI 28.46 kg/m?  ?GEN: NAD ?EYE: Sclerae anicteric ?ENT: MMM ?CV: Non-tachycardic ?Pulm: CTA b/l ?GI: Soft, NT/ND ?NEURO:  Alert & Oriented x 3 ? ? ?Zenovia Jarred, MD ?[facility] ? ?02/11/2022 2:01 PM ? ?

## 2022-02-11 NOTE — Patient Instructions (Signed)
Resume Eliquis today ?Start Pantoprazole ? ? ? ? ? ?YOU HAD AN ENDOSCOPIC PROCEDURE TODAY: Refer to the procedure report and other information in the discharge instructions given to you for any specific questions about what was found during the examination. If this information does not answer your questions, please call [place] office at [phone] to clarify.  ? ?YOU SHOULD EXPECT: Some feelings of bloating in the abdomen. Passage of more gas than usual. Walking can help get rid of the air that was put into your GI tract during the procedure and reduce the bloating. If you had a lower endoscopy (such as a colonoscopy or flexible sigmoidoscopy) you may notice spotting of blood in your stool or on the toilet paper. Some abdominal soreness may be present for a day or two, also. ? ?DIET: Your first meal following the procedure should be a light meal and then it is ok to progress to your normal diet. A half-sandwich or bowl of soup is an example of a good first meal. Heavy or fried foods are harder to digest and may make you feel nauseous or bloated. Drink plenty of fluids but you should avoid alcoholic beverages for 24 hours. If you had a esophageal dilation, please see attached instructions for diet.   ? ?ACTIVITY: Your care partner should take you home directly after the procedure. You should plan to take it easy, moving slowly for the rest of the day. You can resume normal activity the day after the procedure however YOU SHOULD NOT DRIVE, use power tools, machinery or perform tasks that involve climbing or major physical exertion for 24 hours (because of the sedation medicines used during the test).  ? ?SYMPTOMS TO REPORT IMMEDIATELY: ?A gastroenterologist can be reached at any hour. Please call [phone]  for any of the following symptoms:  ?Following upper endoscopy (EGD, EUS, ERCP, esophageal dilation) ?Vomiting of blood or coffee ground material  ?New, significant abdominal pain  ?New, significant chest  pain or pain under the shoulder blades  ?Painful or persistently difficult swallowing  ?New shortness of breath  ?Black, tarry-looking or red, bloody stools ? ?FOLLOW UP:  ?If any biopsies were taken you will be contacted by phone or by letter within the next 1-3 weeks. Call [phone]  if you have not heard about the biopsies in 3 weeks.  ?Please also call with any specific questions about appointments or follow up tests.  ?

## 2022-02-11 NOTE — Op Note (Signed)
[place] ?Patient Name: Walter Brown ?Procedure Date: 02/11/2022 1:58 PM ?MRN: [id number] ?Endoscopist: Jerene Bears , MD ?Age: 81 ?Referring MD:  ?Date of Birth: [birth year] ?Gender: Male ?Account #: [id number] ?Procedure:                Upper GI endoscopy ?Indications:              Pill dysphagia, Abnormal cine-esophagram (mucosal  ?                          irregularity at distal esophagus/GEJ) ?Medicines:                Monitored Anesthesia Care ?Procedure:                Pre-Anesthesia Assessment: ?                          - Prior to the procedure, a History and Physical  ?                          was performed, and patient medications and  ?                          allergies were reviewed. The patient's tolerance of  ?                          previous anesthesia was also reviewed. The risks  ?                          and benefits of the procedure and the sedation  ?                          options and risks were discussed with the patient.  ?                          All questions were answered, and informed consent  ?                          was obtained. Prior Anticoagulants: The patient has  ?                          taken Eliquis (apixaban), last dose was 1 day prior  ?                          to procedure. ASA Grade Assessment: III - A patient  ?                          with severe systemic disease. After reviewing the  ?                          risks and benefits, the patient was deemed in  ?                          satisfactory condition to undergo the procedure. ?  After obtaining informed consent, the endoscope was  ?                          passed under direct vision. Throughout the  ?                          procedure, the patient's blood pressure, pulse, and  ?                          oxygen saturations were monitored continuously. The  ?                          Endoscope was introduced through the mouth, and  ?                          advanced to  the second part of duodenum. The upper  ?                          GI endoscopy was accomplished without difficulty.  ?                          The patient tolerated the procedure well. ?Scope In: ?Scope Out: ?Findings:                 The examined esophagus was normal. No mucosal  ?                          irregularity or evidence of esophageal stricture. ?                          Patchy moderate inflammation characterized by  ?                          erythema, granularity and shallow ulcerations  ?                          (antral) was found in the gastric fundus, in the  ?                          gastric body and in the gastric antrum. Biopsies  ?                          were taken with a cold forceps for histology and  ?                          Helicobacter pylori testing. ?                          The examined duodenum was normal. ?Complications:            No immediate complications. ?Estimated Blood Loss:     Estimated blood loss was minimal. ?Impression:               - Normal esophagus. ?                          -  Gastritis. Biopsied. ?                          - Normal examined duodenum. ?Recommendation:           - Patient has a contact number available for  ?                          emergencies. The signs and symptoms of potential  ?                          delayed complications were discussed with the  ?                          patient. Return to normal activities tomorrow.  ?                          Written discharge instructions were provided to the  ?                          patient. ?                          - Resume previous diet. ?                          - Continue present medications. ?                          - Pantoprazole 40 mg once daily x 8 weeks for  ?                          gastritis with ulceration. Follow-up H. Pylori  ?                          status with biopsies today and treat if positive. ?                          - Resume Eliquis (apixaban) at prior dose  today.  ?                          Refer to managing physician for further adjustment  ?                          of therapy. ?                          - Await pathology results. ?Jerene Bears, MD ?02/11/2022 2:30:46 PM ?This report has been signed electronically. ?

## 2022-02-11 NOTE — Progress Notes (Signed)
1413 Robinul 0.1 mg IV given due large amount of secretions upon assessment.  MD made aware, vss  ?

## 2022-02-11 NOTE — Progress Notes (Signed)
Report given to PACU, vss 

## 2022-02-12 NOTE — Telephone Encounter (Signed)
CBC placed ?

## 2022-02-13 ENCOUNTER — Telehealth: Payer: Self-pay | Admitting: *Deleted

## 2022-02-13 NOTE — Telephone Encounter (Signed)
?  Follow up Call- ? ? ?  02/11/2022  ?  1:09 PM  ?Call back number  ?Post procedure Call Back phone  # [phone]  ?Permission to leave phone message Yes  ?  ? ?Patient questions: ? ?Do you have a fever, pain , or abdominal swelling? No. ?Pain Score  0 * ? ?Have you tolerated food without any problems? Yes.   ? ?Have you been able to return to your normal activities? Yes.   ? ?Do you have any questions about your discharge instructions: ?Diet   No. ?Medications  No. ?Follow up visit  No. ? ?Do you have questions or concerns about your Care? No. ? ?Actions: ?* If pain score is 4 or above: ?No action needed, pain <4. ? ? ?

## 2022-02-14 ENCOUNTER — Encounter: Payer: Self-pay | Admitting: Internal Medicine

## 2022-02-15 ENCOUNTER — Other Ambulatory Visit (INDEPENDENT_AMBULATORY_CARE_PROVIDER_SITE_OTHER): Payer: PPO

## 2022-02-15 DIAGNOSIS — D696 Thrombocytopenia, unspecified: Secondary | ICD-10-CM | POA: Diagnosis not present

## 2022-02-15 LAB — CBC WITH DIFFERENTIAL/PLATELET
Basophils Absolute: 0.1 10*3/uL (ref 0.0–0.1)
Basophils Relative: 1 % (ref 0.0–3.0)
Eosinophils Absolute: 0.1 10*3/uL (ref 0.0–0.7)
Eosinophils Relative: 1 % (ref 0.0–5.0)
HCT: 40.5 % (ref 39.0–52.0)
Hemoglobin: 13.7 g/dL (ref 13.0–17.0)
Lymphocytes Relative: 18.2 % (ref 12.0–46.0)
Lymphs Abs: 1.1 10*3/uL (ref 0.7–4.0)
MCHC: 33.8 g/dL (ref 30.0–36.0)
MCV: 95.4 fl (ref 78.0–100.0)
Monocytes Absolute: 0.6 10*3/uL (ref 0.1–1.0)
Monocytes Relative: 10.1 % (ref 3.0–12.0)
Neutro Abs: 4.4 10*3/uL (ref 1.4–7.7)
Neutrophils Relative %: 69.7 % (ref 43.0–77.0)
Platelets: 119 10*3/uL — ABNORMAL LOW (ref 150.0–400.0)
RBC: 4.25 Mil/uL (ref 4.22–5.81)
RDW: 14.4 % (ref 11.5–15.5)
WBC: 6.2 10*3/uL (ref 4.0–10.5)

## 2022-02-16 ENCOUNTER — Other Ambulatory Visit: Payer: Self-pay | Admitting: Cardiovascular Disease

## 2022-03-03 ENCOUNTER — Other Ambulatory Visit: Payer: Self-pay | Admitting: Family Medicine

## 2022-03-06 ENCOUNTER — Ambulatory Visit: Payer: PPO | Admitting: Physical Therapy

## 2022-03-08 DIAGNOSIS — H2513 Age-related nuclear cataract, bilateral: Secondary | ICD-10-CM | POA: Diagnosis not present

## 2022-03-08 DIAGNOSIS — H353111 Nonexudative age-related macular degeneration, right eye, early dry stage: Secondary | ICD-10-CM | POA: Diagnosis not present

## 2022-03-08 DIAGNOSIS — H353221 Exudative age-related macular degeneration, left eye, with active choroidal neovascularization: Secondary | ICD-10-CM | POA: Diagnosis not present

## 2022-03-08 DIAGNOSIS — H5203 Hypermetropia, bilateral: Secondary | ICD-10-CM | POA: Diagnosis not present

## 2022-03-25 ENCOUNTER — Encounter: Payer: Self-pay | Admitting: Physical Therapy

## 2022-03-25 NOTE — Therapy (Signed)
[facility] [address], [place], [place], [address] Phone: [phone]   Fax:  [phone]  Patient Details  Name: Walter Brown MRN: [id number] Date of Birth: 03/19/1941 Referring Provider:  No ref. provider found  Encounter Date: 03/25/2022   PHYSICAL THERAPY DISCHARGE SUMMARY  Visits from Start of Care: 7  Current functional level related to goals / functional outcomes:  PT Short Term Goals - 02/04/22 1716       PT SHORT TERM GOAL #1   Title Pt will be independent with HEP for improved strength, balance, transfers, and gait.  TARGET 02/08/2022    Baseline Needed cues last visit for HEP    Time 4    Period Weeks    Status Not Met      PT SHORT TERM GOAL #2   Title Pt will perform 5x sit<>stand with no posterior lean, no LOB, to demonstrate improved functional strength and transfer efficiency.    Baseline posterior lean x 2 reps    Time 4    Period Weeks    Status Not Met      PT SHORT TERM GOAL #3   Title Pt will improve TUG score to less than or equal to 13.5 sec for decreased fall risk.    Baseline 16 sec, 14.44 sec    Time 4    Period Weeks    Status Partially Met             PT Long Term Goals - 01/18/22 0926       PT LONG TERM GOAL #1   Title Pt will be independent with progression of HEP for improved strength, balance, transfers, and gait.  TARGET 02/22/2022    Time 6    Period Weeks    Status On-going      PT LONG TERM GOAL #2   Title Pt will improve MiniBESTest score to at least 19/28 to decrease fall risk.    Baseline 15/28    Time 6    Period Weeks    Status On-going      PT LONG TERM GOAL #3   Title Pt will improve TUG cognitive score to less than or equal to 15 sec for decreased fall risk.    Baseline 17.13 sec    Time 6    Period Weeks    Status On-going      PT LONG TERM GOAL #4   Title Pt will verbalize understanding of fall prevention in home environment.    Time 6    Period Weeks     Status On-going            Unable to fully assess LTGs, as pt did not return after cancelling his last visit.   Remaining deficits: balance   Education / Equipment: Educated in HEP   Patient agrees to discharge. Patient goals were not met. Patient is being discharged due to not returning since the last visit.   Santosha Jividen W., PT 03/25/2022, 4:37 PM  [facility] [address], [place], [place], [address] Phone: [phone]   Fax:  [phone]

## 2022-03-26 ENCOUNTER — Ambulatory Visit (INDEPENDENT_AMBULATORY_CARE_PROVIDER_SITE_OTHER): Payer: PPO

## 2022-03-26 DIAGNOSIS — Z0000 Encounter for general adult medical examination without abnormal findings: Secondary | ICD-10-CM | POA: Diagnosis not present

## 2022-03-26 NOTE — Patient Instructions (Signed)
[name] , Thank you for taking time to come for your Medicare Wellness Visit. I appreciate your ongoing commitment to your health goals. Please review the following plan we discussed and let me know if I can assist you in the future.   Screening recommendations/referrals: Colonoscopy: no longer required  Recommended yearly ophthalmology/optometry visit for glaucoma screening and checkup Recommended yearly dental visit for hygiene and checkup  Vaccinations: Influenza vaccine: Done 06/18/21 repeat every year Pneumococcal vaccine: Up to date Tdap vaccine: Done 11/04/13 repeat every 10 years  Shingles vaccine: Completed 05/26/18, 11/05/18   Covid-19: Completed 1/13, 2/3, 06/29/20, 4/5, 06/18/21 & 02/12/22  Advanced directives: Please bring a copy of your health care power of attorney and living will to the office at your convenience.  Conditions/risks identified: Continue to exercise   Next appointment: Follow up in one year for your annual wellness visit.   Preventive Care 13 Years and Older, Male Preventive care refers to lifestyle choices and visits with your health care provider that can promote health and wellness. What does preventive care include? A yearly physical exam. This is also called an annual well check. Dental exams once or twice a year. Routine eye exams. Ask your health care provider how often you should have your eyes checked. Personal lifestyle choices, including: Daily care of your teeth and gums. Regular physical activity. Eating a healthy diet. Avoiding tobacco and drug use. Limiting alcohol use. Practicing safe sex. Taking low doses of aspirin every day. Taking vitamin and mineral supplements as recommended by your health care provider. What happens during an annual well check? The services and screenings done by your health care provider during your annual well check will depend on your age, overall health, lifestyle risk factors, and family history of  disease. Counseling  Your health care provider may ask you questions about your: Alcohol use. Tobacco use. Drug use. Emotional well-being. Home and relationship well-being. Sexual activity. Eating habits. History of falls. Memory and ability to understand (cognition). Work and work Statistician. Screening  You may have the following tests or measurements: Height, weight, and BMI. Blood pressure. Lipid and cholesterol levels. These may be checked every 5 years, or more frequently if you are over 68 years old. Skin check. Lung cancer screening. You may have this screening every year starting at age 31 if you have a 30-pack-year history of smoking and currently smoke or have quit within the past 15 years. Fecal occult blood test (FOBT) of the stool. You may have this test every year starting at age 22. Flexible sigmoidoscopy or colonoscopy. You may have a sigmoidoscopy every 5 years or a colonoscopy every 10 years starting at age 74. Prostate cancer screening. Recommendations will vary depending on your family history and other risks. Hepatitis C blood test. Hepatitis B blood test. Sexually transmitted disease (STD) testing. Diabetes screening. This is done by checking your blood sugar (glucose) after you have not eaten for a while (fasting). You may have this done every 1-3 years. Abdominal aortic aneurysm (AAA) screening. You may need this if you are a current or former smoker. Osteoporosis. You may be screened starting at age 58 if you are at high risk. Talk with your health care provider about your test results, treatment options, and if necessary, the need for more tests. Vaccines  Your health care provider may recommend certain vaccines, such as: Influenza vaccine. This is recommended every year. Tetanus, diphtheria, and acellular pertussis (Tdap, Td) vaccine. You may need a Td booster every  10 years. Zoster vaccine. You may need this after age 69. Pneumococcal 13-valent  conjugate (PCV13) vaccine. One dose is recommended after age 60. Pneumococcal polysaccharide (PPSV23) vaccine. One dose is recommended after age 32. Talk to your health care provider about which screenings and vaccines you need and how often you need them. This information is not intended to replace advice given to you by your health care provider. Make sure you discuss any questions you have with your health care provider. Document Released: 10/20/2015 Document Revised: 06/12/2016 Document Reviewed: 07/25/2015 Elsevier Interactive Patient Education  2017 [place] Prevention in the Home Falls can cause injuries. They can happen to people of all ages. There are many things you can do to make your home safe and to help prevent falls. What can I do on the outside of my home? Regularly fix the edges of walkways and driveways and fix any cracks. Remove anything that might make you trip as you walk through a door, such as a raised step or threshold. Trim any bushes or trees on the path to your home. Use bright outdoor lighting. Clear any walking paths of anything that might make someone trip, such as rocks or tools. Regularly check to see if handrails are loose or broken. Make sure that both sides of any steps have handrails. Any raised decks and porches should have guardrails on the edges. Have any leaves, snow, or ice cleared regularly. Use sand or salt on walking paths during winter. Clean up any spills in your garage right away. This includes oil or grease spills. What can I do in the bathroom? Use night lights. Install grab bars by the toilet and in the tub and shower. Do not use towel bars as grab bars. Use non-skid mats or decals in the tub or shower. If you need to sit down in the shower, use a plastic, non-slip stool. Keep the floor dry. Clean up any water that spills on the floor as soon as it happens. Remove soap buildup in the tub or shower regularly. Attach bath mats  securely with double-sided non-slip rug tape. Do not have throw rugs and other things on the floor that can make you trip. What can I do in the bedroom? Use night lights. Make sure that you have a light by your bed that is easy to reach. Do not use any sheets or blankets that are too big for your bed. They should not hang down onto the floor. Have a firm chair that has side arms. You can use this for support while you get dressed. Do not have throw rugs and other things on the floor that can make you trip. What can I do in the kitchen? Clean up any spills right away. Avoid walking on wet floors. Keep items that you use a lot in easy-to-reach places. If you need to reach something above you, use a strong step stool that has a grab bar. Keep electrical cords out of the way. Do not use floor polish or wax that makes floors slippery. If you must use wax, use non-skid floor wax. Do not have throw rugs and other things on the floor that can make you trip. What can I do with my stairs? Do not leave any items on the stairs. Make sure that there are handrails on both sides of the stairs and use them. Fix handrails that are broken or loose. Make sure that handrails are as long as the stairways. Check any carpeting to make  sure that it is firmly attached to the stairs. Fix any carpet that is loose or worn. Avoid having throw rugs at the top or bottom of the stairs. If you do have throw rugs, attach them to the floor with carpet tape. Make sure that you have a light switch at the top of the stairs and the bottom of the stairs. If you do not have them, ask someone to add them for you. What else can I do to help prevent falls? Wear shoes that: Do not have high heels. Have rubber bottoms. Are comfortable and fit you well. Are closed at the toe. Do not wear sandals. If you use a stepladder: Make sure that it is fully opened. Do not climb a closed stepladder. Make sure that both sides of the stepladder  are locked into place. Ask someone to hold it for you, if possible. Clearly mark and make sure that you can see: Any grab bars or handrails. First and last steps. Where the edge of each step is. Use tools that help you move around (mobility aids) if they are needed. These include: Canes. Walkers. Scooters. Crutches. Turn on the lights when you go into a dark area. Replace any light bulbs as soon as they burn out. Set up your furniture so you have a clear path. Avoid moving your furniture around. If any of your floors are uneven, fix them. If there are any pets around you, be aware of where they are. Review your medicines with your doctor. Some medicines can make you feel dizzy. This can increase your chance of falling. Ask your doctor what other things that you can do to help prevent falls. This information is not intended to replace advice given to you by your health care provider. Make sure you discuss any questions you have with your health care provider. Document Released: 07/20/2009 Document Revised: 02/29/2016 Document Reviewed: 10/28/2014 Elsevier Interactive Patient Education  2017 [employer].

## 2022-03-26 NOTE — Progress Notes (Addendum)
Virtual Visit via Telephone Note  I connected with  Walter Brown on 03/26/22 at  2:45 PM EDT by telephone and verified that I am speaking with the correct person using two identifiers.  Medicare Annual Wellness visit completed telephonically due to Covid-19 pandemic.   Persons participating in this call: This Health Coach and this patient.   Location: Patient: Home Provider: Office    I discussed the limitations, risks, security and privacy concerns of performing an evaluation and management service by telephone and the availability of in person appointments. The patient expressed understanding and agreed to proceed.  Unable to perform video visit due to video visit attempted and failed and/or patient does not have video capability.   Some vital signs may be absent or patient reported.   Willette Brace, LPN   Subjective:   Walter Brown is a 81 y.o. male who presents for Medicare Annual/Subsequent preventive examination.  Review of Systems     Cardiac Risk Factors include: advanced age (>2mn, >>54women);male gender;dyslipidemia;hypertension     Objective:    There were no vitals filed for this visit. There is no height or weight on file to calculate BMI.     03/26/2022    2:01 PM 01/10/2022   11:53 AM 11/18/2021    1:15 PM 10/25/2021   11:16 AM 05/04/2021    3:18 PM 03/13/2021    3:05 PM 10/31/2020    1:03 PM  Advanced Directives  Does Patient Have a Medical Advance Directive? Yes Yes Yes Yes Yes Yes Yes  Type of AParamed[place]of [place]Living will H[place]Living will  Living will [place]ig[place]Living will;Out of facility DNR (pink MOST or yellow form) HP[place]Living will [place]Living will  Does patient want to make changes to medical advance directive?  No - Patient declined       Copy of [place] Chart? No - copy requested     No - copy requested      Current Medications (verified) Outpatient Encounter Medications as of 03/26/2022  Medication Sig   amiodarone (PACERONE) 200 MG tablet TAKE 1 TABLET BY MOUTH EVERY DAY   apixaban (ELIQUIS) 5 MG TABS tablet TAKE 1 TABLET BY MOUTH TWICE A DAY   carbidopa-levodopa (SINEMET CR) 50-200 MG tablet Take 1 tablet by mouth at bedtime.   carbidopa-levodopa (SINEMET IR) 25-100 MG tablet TAKE 1 TABLET BY MOUTH 3 (THREE) TIMES DAILY. 7AM/11AM/4PM   hydrochlorothiazide (HYDRODIURIL) 25 MG tablet TAKE 1 TABLET BY MOUTH EVERY DAY   Melatonin 10 MG TABS Take 10 mg by mouth daily as needed.   Multiple Vitamins-Minerals (ICAPS AREDS 2 PO) Take 1 capsule by mouth in the morning and at bedtime.   pantoprazole (PROTONIX) 40 MG tablet Take 1 tablet (40 mg total) by mouth daily.   rosuvastatin (CRESTOR) 5 MG tablet TAKE 1 TABLET BY MOUTH EVERY DAY   sertraline (ZOLOFT) 100 MG tablet TAKE 1 TABLET BY MOUTH EVERY DAY   terazosin (HYTRIN) 10 MG capsule Take 10 mg by mouth at bedtime.    No facility-administered encounter medications on file as of 03/26/2022.    Allergies (verified) Albuterol sulfate, Azithromycin, and Doxycycline hyclate   History: Past Medical History:  Diagnosis Date   Allergy    Anemia    mild   Anxiety    Arthritis 04/06/2017   Asthma    childhood   ASVD (arteriosclerotic vascular disease) 09/07/2018  Basal cell carcinoma    skin- on nose- basal cell (20 yrs ago) forehead 1 year ago   BPH (benign prostatic hyperplasia)    Bradycardia 06/28/2018   Cerumen impaction 08/10/2012   Chicken pox as child   Coronary artery calcification 11/23/2019   Elevated LFTs    [place] measles as a child   Hyperlipidemia    Hypertension    Internal hemorrhoids    Irregular cardiac rhythm 03/10/2018   Macular degeneration    Major depressive disorder 08/01/2012   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   Obstructive sleep apnea 11/09/2018   Patient reports  mild symptoms; was not prescribed a CPAP machine   Otitis externa 08/10/2012   PAF (paroxysmal atrial fibrillation)    Parkinson's disease 05/19/2019   Thrombocytopenia    TIA (transient ischemic attack) 03/10/2018   Past Surgical History:  Procedure Laterality Date   [place]     very young, b/l   EXCISIONAL HEMORRHOIDECTOMY     HYDROCELE EXCISION / REPAIR  2012   b/l   SKIN CANCER EXCISION     nose and forehead, basal cell CA   UPPER GASTROINTESTINAL ENDOSCOPY     Family History  Problem Relation Age of Onset   Hypertension Mother    Other Mother        MRSA   Heart disease Mother        stents   Heart disease Father    Hypertension Father    Prostate cancer Father 49   Parkinson's disease Father    Stroke Maternal Grandmother    Prostate cancer Maternal Grandfather    Cancer Paternal Grandfather        EYE   ADD / ADHD Son        ADHD   Other Son 24       part of 1 lung removed- due to infection   Colon cancer Neg Hx    Esophageal cancer Neg Hx    Rectal cancer Neg Hx    Stomach cancer Neg Hx    Social History   Socioeconomic History   Marital status: Married    Spouse name: Not on file   Number of children: 2   Years of education: 16   Highest education level: Bachelor's degree (e.g., BA, AB, BS)  Occupational History   Occupation: retired  Tobacco Use   Smoking status: Former    Packs/day: 1.50    Years: 15.00    Total pack years: 22.50    Types: Cigarettes    Start date: 10/07/1978   Smokeless tobacco: Never  Vaping Use   Vaping Use: Never used  Substance and Sexual Activity   Alcohol use: Yes    Alcohol/week: 7.0 - 14.0 standard drinks of alcohol    Types: 7 - 14 Glasses of wine per week    Comment: 1-2 glasses of wine with dinner   Drug use: No   Sexual activity: Yes    Comment: lives with wife, 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  Other Topics Concern   Not on file  Social  History Narrative   Right handed    Social Determinants of Health   Financial Resource Strain: Low Risk  (03/26/2022)   Overall Financial Resource Strain (CARDIA)    Difficulty of Paying Living Expenses: Not hard at all  Food Insecurity: No Food Insecurity (03/26/2022)   Hunger Vital Sign    Worried About Running Out  of Food in the Last Year: Never true    [place] in the Last Year: Never true  Transportation Needs: No Transportation Needs (03/26/2022)   PRAPARE - Hydrologist (Medical): No    Lack of Transportation (Non-Medical): No  Physical Activity: Sufficiently Active (03/26/2022)   Exercise Vital Sign    Days of Exercise per Week: 5 days    Minutes of Exercise per Session: 50 min  Stress: Stress Concern Present (03/26/2022)   [place]    Feeling of Stress : To some extent  Social Connections: Moderately Isolated (03/26/2022)   Social Connection and Isolation Panel [NHANES]    Frequency of Communication with Friends and Family: More than three times a week    Frequency of Social Gatherings with Friends and Family: Once a week    Attends Religious Services: Never    Marine scientist or Organizations: No    Attends Music therapist: Never    Marital Status: Married    Tobacco Counseling Counseling given: Not Answered   Clinical Intake:  Pre-visit preparation completed: Yes  Pain : No/denies pain     BMI - recorded: 28.46 Nutritional Status: BMI 25 -29 Overweight Nutritional Risks: None Diabetes: No  How often do you need to have someone help you when you read instructions, pamphlets, or other written materials from your doctor or pharmacy?: 1 - Never  Diabetic?no  Interpreter Needed?: No  Information entered by :: Charlott Rakes, LPN   Activities of Daily Living    03/26/2022    2:03 PM  In your present state of health, do you have any  difficulty performing the following activities:  Hearing? 1  Comment wears hearing aid  Vision? 0  Difficulty concentrating or making decisions? 0  Walking or climbing stairs? 0  Dressing or bathing? 0  Doing errands, shopping? 0  Preparing Food and eating ? N  Using the Toilet? N  In the past six months, have you accidently leaked urine? N  Do you have problems with loss of bowel control? N  Managing your Medications? N  Managing your Finances? N  Housekeeping or managing your Housekeeping? N    Patient Care Team: [name], MD as PCP - General (Family Medicine) Renda Rolls, Jennefer Bravo, MD as Referring Physician (Dermatology) Luberta Mutter, MD as Consulting Physician (Ophthalmology) Ardis Hughs, MD as Consulting Physician (Urology) Tat, Eustace Quail, DO as Consulting Physician (Neurology)  Indicate any recent Medical Services you may have received from other than [facility] providers in the past year (date may be approximate).     Assessment:   This is a routine wellness examination for [place].  Hearing/Vision screen Hearing Screening - Comments:: Pt wears  a hearing aid  Vision Screening - Comments:: Pt follows up with Dr  Ellie Lunch for annual eye exams   Dietary issues and exercise activities discussed: Current Exercise Habits: Home exercise routine, Type of exercise: walking, Time (Minutes): 50, Frequency (Times/Week): 5, Weekly Exercise (Minutes/Week): 250   Goals Addressed             This Visit's Progress    Patient Stated       Continue exercising        Depression Screen    03/26/2022    2:00 PM 01/17/2022   11:26 AM 09/11/2021   11:51 AM 03/13/2021    3:10 PM 03/12/2021    1:59 PM 02/06/2021  2:26 PM 10/26/2020   11:31 AM  PHQ 2/9 Scores  PHQ - 2 Score '1 2 2 1 3 3 4  '$ PHQ- 9 Score '5 6 6  11 7 10    '$ Fall Risk    03/26/2022    2:03 PM 01/17/2022   11:26 AM 11/05/2021    8:46 AM 10/25/2021   11:16 AM 05/04/2021    3:17 PM  Fall Risk   Falls in  the past year? 1 0 1 1 0  Number falls in past yr: 1 0 1 1 0  Injury with Fall? 0 0 1 1 0  Risk for fall due to : Impaired vision;Impaired balance/gait No Fall Risks History of fall(s);Impaired balance/gait;Impaired mobility    Follow up Falls prevention discussed Falls evaluation completed Education provided    Comment   patient referred by neuro to physical therapy      [place]:  Any stairs in or around the home? No  If so, are there any without handrails? No  Home free of loose throw rugs in walkways, pet beds, electrical cords, etc? Yes  Adequate lighting in your home to reduce risk of falls? Yes   ASSISTIVE DEVICES UTILIZED TO PREVENT FALLS:  Life alert? No  Use of a cane, walker or w/c? Yes  Grab bars in the bathroom? Yes  Shower chair or bench in shower? Yes  Elevated toilet seat or a handicapped toilet? Yes   TIMED UP AND GO:  Was the test performed? No .   Cognitive Function:    03/05/2016   10:01 AM  MMSE - Mini Mental State Exam  Orientation to time 5  Orientation to Place 5  Registration 3  Attention/ Calculation 5  Recall 3  Language- name 2 objects 2  Language- repeat 1  Language- follow 3 step command 3  Language- read & follow direction 1  Write a sentence 1  Copy design 1  Total score 30        03/26/2022    2:05 PM 10/11/2019    9:19 AM  6CIT Screen  What Year? 0 points 0 points  What month? 0 points 0 points  What time? 0 points 0 points  Count back from 20 0 points 0 points  Months in reverse 2 points 0 points  Repeat phrase 4 points 2 points  Total Score 6 points 2 points    Immunizations Immunization History  Administered Date(s) Administered   Influenza Split 07/02/2012   Influenza Whole 06/07/2013   Influenza, High Dose Seasonal PF 07/08/2017, 05/19/2019, 06/22/2020, 06/18/2021   Influenza,inj,Quad PF,6+ Mos 06/21/2014   Influenza-Unspecified 06/07/2016, 07/08/2017, 05/26/2018   PFIZER  Comirnaty(Gray Top)Covid-19 Tri-Sucrose Vaccine 01/09/2021   PFIZER(Purple Top)SARS-COV-2 Vaccination 10/20/2019, 11/10/2019, 06/29/2020   Pfizer Covid-19 Vaccine Bivalent Booster 14yr & up 06/18/2021, 02/12/2022   Pneumococcal Conjugate-13 11/04/2013   Pneumococcal Polysaccharide-23 10/08/2007, 08/04/2020   Tdap 11/04/2013   Zoster Recombinat (Shingrix) 05/26/2018, 11/05/2018   Zoster, Live 10/07/1998    TDAP status: Up to date  Flu Vaccine status: Up to date  Pneumococcal vaccine status: Up to date  Covid-19 vaccine status: Completed vaccines  Qualifies for Shingles Vaccine? Yes   Zostavax completed Yes   Shingrix Completed?: Yes  Screening Tests Health Maintenance  Topic Date Due   INFLUENZA VACCINE  05/07/2022   TETANUS/TDAP  11/05/2023   Pneumonia Vaccine 81 Years old  Completed   COVID-19 Vaccine  Completed   Zoster Vaccines- Shingrix  Completed  HPV VACCINES  Aged Out    Health Maintenance  There are no preventive care reminders to display for this patient.  Colorectal cancer screening: No longer required.    Additional Screening:   Vision Screening: Recommended annual ophthalmology exams for early detection of glaucoma and other disorders of the eye. Is the patient up to date with their annual eye exam?  Yes  Who is the provider or what is the name of the office in which the patient attends annual eye exams? Dr Ellie Lunch If pt is not established with a provider, would they like to be referred to a provider to establish care? No .   Dental Screening: Recommended annual dental exams for proper oral hygiene  Community Resource Referral / Chronic Care Management: CRR required this visit?  No   CCM required this visit?  No      Plan:     I have personally reviewed and noted the following in the patient's chart:   Medical and social history Use of alcohol, tobacco or illicit drugs  Current medications and supplements including opioid prescriptions.  Patient is not currently taking opioid prescriptions. Functional ability and status Nutritional status Physical activity Advanced directives List of other physicians Hospitalizations, surgeries, and ER visits in previous 12 months Vitals Screenings to include cognitive, depression, and falls Referrals and appointments  In addition, I have reviewed and discussed with patient certain preventive protocols, quality metrics, and best practice recommendations. A written personalized care plan for preventive services as well as general preventive health recommendations were provided to patient.     Willette Brace, LPN   03/03/7823   Nurse Notes: None

## 2022-04-11 DIAGNOSIS — H43813 Vitreous degeneration, bilateral: Secondary | ICD-10-CM | POA: Diagnosis not present

## 2022-04-11 DIAGNOSIS — H35372 Puckering of macula, left eye: Secondary | ICD-10-CM | POA: Diagnosis not present

## 2022-04-11 DIAGNOSIS — H353112 Nonexudative age-related macular degeneration, right eye, intermediate dry stage: Secondary | ICD-10-CM | POA: Diagnosis not present

## 2022-04-11 DIAGNOSIS — H353221 Exudative age-related macular degeneration, left eye, with active choroidal neovascularization: Secondary | ICD-10-CM | POA: Diagnosis not present

## 2022-04-15 ENCOUNTER — Other Ambulatory Visit: Payer: Self-pay | Admitting: Family Medicine

## 2022-04-18 ENCOUNTER — Other Ambulatory Visit: Payer: Self-pay | Admitting: Neurology

## 2022-04-30 ENCOUNTER — Other Ambulatory Visit: Payer: Self-pay | Admitting: Neurology

## 2022-04-30 DIAGNOSIS — G20 Parkinson's disease: Secondary | ICD-10-CM

## 2022-05-03 NOTE — Progress Notes (Unsigned)
Assessment/Plan:   1.  Parkinsons Disease  -DaT scan abnormal in the past  -continue carbidopa/levodopa 25/100 1 tablet three times per day.  Can take an extra prn  -continue carbidopa/levodopa 50/200 at bed to see if it will help q hs walking when wakes up to use the bathroom  2.  MCI  -Neurocognitive testing with Dr. Melvyn Novas in March, 2021 with evidence of MCI only.  3.  Depression  -Primary care prescribing sertraline, 100 mg daily.  Doing well  4.  Bradycardia  -d/w patient that this is out of my field of expertise but needs f/u on.  His HR is in mid 40's and was when he had his EGD in May.    Subjective:   Walter Brown was seen today in follow up for Parkinsons disease.  My previous records were reviewed prior to todays visit as well as outside records available to me.  Wife accompanies the patient and supplements the history.  He had a few falls; one time he just fell off a bench.  He didn't get hurt.   He has attended physical therapy since our last visit.  Those notes have been reviewed.  He didn't think it helped.  We did add q hs carbidopa/levodopa and he does think that this was helpful.  Pt denies lightheadedness, near syncope.  No hallucinations.    Current prescribed movement disorder medications: Carbidopa/levodopa 25/100, 1 tablet 3 times per day Carbidopa/levodopa 50/200 at bedtime (added last visit)  ALLERGIES:   Allergies  Allergen Reactions   Albuterol Sulfate Palpitations   Azithromycin Other (See Comments)    Hepatotoxicity   Doxycycline Hyclate Other (See Comments)    Taken with Azithromycin and had Heaptotoxicity    CURRENT MEDICATIONS:  Outpatient Encounter Medications as of 05/07/2022  Medication Sig   amiodarone (PACERONE) 200 MG tablet TAKE 1 TABLET BY MOUTH EVERY DAY   apixaban (ELIQUIS) 5 MG TABS tablet TAKE 1 TABLET BY MOUTH TWICE A DAY   carbidopa-levodopa (SINEMET CR) 50-200 MG tablet TAKE 1 TABLET BY MOUTH EVERYDAY AT BEDTIME    carbidopa-levodopa (SINEMET IR) 25-100 MG tablet TAKE 1 TABLET BY MOUTH 3 (THREE) TIMES DAILY. 7AM/11AM/4PM   hydrochlorothiazide (HYDRODIURIL) 25 MG tablet TAKE 1 TABLET BY MOUTH EVERY DAY   Melatonin 10 MG TABS Take 10 mg by mouth daily as needed.   Multiple Vitamins-Minerals (ICAPS AREDS 2 PO) Take 1 capsule by mouth in the morning and at bedtime.   rosuvastatin (CRESTOR) 5 MG tablet TAKE 1 TABLET BY MOUTH EVERY DAY   sertraline (ZOLOFT) 100 MG tablet TAKE 1 TABLET BY MOUTH EVERY DAY   terazosin (HYTRIN) 10 MG capsule Take 10 mg by mouth at bedtime.    pantoprazole (PROTONIX) 40 MG tablet Take 1 tablet (40 mg total) by mouth daily. (Patient not taking: Reported on 05/07/2022)   No facility-administered encounter medications on file as of 05/07/2022.    Objective:   PHYSICAL EXAMINATION:    VITALS:   Vitals:   05/07/22 1248  BP: 110/62  Pulse: (!) 45  SpO2: 97%  Weight: 172 lb 6.4 oz (78.2 kg)  Height: '5\' 5"'$  (1.651 m)       GEN:  The patient appears stated age and is in NAD. HEENT:  Normocephalic, atraumatic.  The mucous membranes are moist.  Neurological examination:  Orientation: The patient is alert and oriented x3. Cranial nerves: There is good facial symmetry with facial hypomimia. The speech is fluent and clear. Soft palate rises  symmetrically and there is no tongue deviation. Hearing is intact to conversational tone. Sensation: Sensation is intact to light touch throughout Motor: Strength is at least antigravity x4.  Movement examination: Tone: There is mild increased tone in the RLE Abnormal movements: none Coordination:  There is min decremation on the R Gait and Station: The patient has no difficulty arising out of a deep-seated chair without the use of the hands. The patient is slow and slightly unbalanced.  Carries the walking stick  I have reviewed and interpreted the following labs independently    Chemistry      Component Value Date/Time   NA 140  01/17/2022 1208   NA 142 04/05/2020 0849   K 4.1 01/17/2022 1208   CL 104 01/17/2022 1208   CO2 31 01/17/2022 1208   BUN 17 01/17/2022 1208   BUN 14 04/05/2020 0849   CREATININE 0.95 01/17/2022 1208   CREATININE 1.16 10/20/2020 1426      Component Value Date/Time   CALCIUM 8.6 01/17/2022 1208   ALKPHOS 61 01/17/2022 1208   AST 15 01/17/2022 1208   ALT 4 01/17/2022 1208   BILITOT 0.7 01/17/2022 1208   BILITOT 0.9 04/05/2020 0849       Lab Results  Component Value Date   WBC 6.2 02/15/2022   HGB 13.7 02/15/2022   HCT 40.5 02/15/2022   MCV 95.4 02/15/2022   PLT 119.0 (L) 02/15/2022    Lab Results  Component Value Date   TSH 1.27 01/17/2022     Total time spent on today's visit was 22 minutes, including both face-to-face time and nonface-to-face time.  Time included that spent on review of records (prior notes available to me/labs/imaging if pertinent), discussing treatment and goals, answering patient's questions and coordinating care.  Cc:  [name], MD

## 2022-05-07 ENCOUNTER — Encounter: Payer: Self-pay | Admitting: Neurology

## 2022-05-07 ENCOUNTER — Ambulatory Visit: Payer: PPO | Admitting: Neurology

## 2022-05-07 VITALS — BP 110/62 | HR 45 | Ht 65.0 in | Wt 172.4 lb

## 2022-05-07 DIAGNOSIS — G20 Parkinson's disease: Secondary | ICD-10-CM | POA: Diagnosis not present

## 2022-05-07 NOTE — Patient Instructions (Addendum)
Follow up with Dr. Charlett Blake and Dr. Gwenlyn Found regarding your heart rate You can take an extra carbidopa/levodopa during the day if needed Local and Online Resources for Power over Parkinson's Group June 2023  [place] over Parkinson's Group:   Power Over Parkinson's Patient Education Group will be Wednesday, June 14th-*Hybrid meting*- in person at [facility] location and via [facility] at 2:00 pm.   Upcoming Power over [employer] Meetings:  2nd Wednesdays of the month at 2 pm:   June 14th, July 12th Contact Amy Marriott at [email or web address] if interested in participating in this group Parkinson's Care Partners Group:    3rd Mondays, Contact Misty Paladino Atypical Parkinsonian Patient Group:   4th Wednesdays, [place] If you are interested in participating in these groups with Misty, please contact her directly for how to join those meetings.  Her contact information is [email or web address].    LOCAL EVENTS AND NEW OFFERINGS Dance Class for People with Parkinson's at [facility].  Friday, June 9th at 2 pm.  Dora Sims by Tyler Deis DPT students.  Contact [email or web address] to register or with questions. Ice Cream Social at [place]!  Thursday, June 15th, 5:30-7:00 pm.  RSVP to [email or web address] for attendance and free ice cream. Parkinson's T-shirts for sale!  Designed by a local group member, with funds going to [place].  $25.00  Investment banker, corporate to purchase  [employer]! Moves [employer] Instructor-Led Class offering at [employer]!  Wednesdays 1-2 pm, starting April 12th.   Contact Bryson Dames, Acupuncturist at [employer].  [email or web address] [email or web address]  [place]:  [email or web address] PD Health at Home continues:  Mindfulness Mondays, Wellness Wednesdays, Fitness Fridays  Upcoming Education: Parkinson's 101:  What You and Your Family Should Know.  Wednesday, June  7th at 1:00 pm Register for expert briefings (webinars) at [email or web address] Please check out their website to sign up for emails and see their full online offerings   [place]:  [email or web address]  Third Thursday Webinars:  On the third Thursday of every month at 12 p.m. ET, join our free live webinars to learn about various aspects of living with Parkinson's disease and our work to speed medical breakthroughs. Upcoming Webinar: REPLAY:  From Low Blood Pressure to Bladder Problems:  A Look at Lesser Known Parkinson's Symptoms.  Thursday, June 15th at 12 noon. Check out additional information on their website to see their full online offerings  [facility]:  [email or web address] Upcoming Webinar:   Stay tuned Webinar Series:  Living with Parkinson's Meetup.   Third Thursdays each month, 3 pm Care Partner Monthly Meetup.  With Robin Searing [place].  First Tuesday of each month, 2 pm Check out additional information to Live Well Today on their website  Parkinson and Movement Disorders (PMD) Alliance:  www.pmdalliance.org [employer] Online:  Online Education Events Sign up for emails, which are sent weekly to give you updates on programming and online offerings  Parkinson's Association of the [place]:  www.[email or web address] Information on online support groups, education events, and online exercises including Yoga, Parkinson's exercises and more-LOTS of information on links to PD resources and online events Virtual Support Group through Parkinson's Association of the [place]; next one is scheduled for Wednesday, June 7th at 2 pm. (These are typically scheduled for the 1st Wednesday of the month at 2 pm).  Visit website for details. Save the date for "Caring for Parkinson's-Caring for You", 9th Annual Symposium.  In-person event in [place].  September 9th.  More  info on  registration to come. MOVEMENT AND EXERCISE OPPORTUNITIES PWR! Moves Classes at [place].  Wednesdays 10 and 11 am.   Contact Amy Marriott, PT [email or web address] if interested. NEW PWR! Moves Class offering at [employer].  Wednesdays 1-2 pm, starting April 12th.  Contact Bryson Dames, Acupuncturist at [employer].  [email or web address] Here is a link to the PWR!Moves classes on Zoom from [place] - Daily Mon-Sat at 10:00. Via Zoom, FREE and open to all.  There is also a link below via Facebook if you use that platform.  [email or web address]  Parkinson's Wellness Recovery (PWR! Moves)  www.[email or web address] Info on the PWR! Virtual Experience:  You will have access to our expertise through self-assessment, guided plans that start with the PD-specific fundamentals, educational content, tips, Q&A with an expert, and a growing Art therapist of PD-specific pre-recorded and live exercise classes of varying types and intensity - both physical and cognitive! If that is not enough, we offer 1:1 wellness consultations (in-person or virtual) to personalize your PWR! Research scientist (medical).  [place] Fridays:  As part of the PD Health @ Home program, this free video series focuses each week on one aspect of fitness designed to support people living with Parkinson's.  These weekly videos highlight the [place] recent fitness guidelines for people with Parkinson's disease. ModemGamers.si Dance for PD website is offering free, live-stream classes throughout the week, as well as links to [employer] of classes:  [email or web address]://[email or web address]/ Virtual dance and Pilates for Parkinson's  classes: Click on the Community Tab> Parkinson's Movement Initiative Tab.  To register for classes and for more information, visit [email or web address] and click the "community" tab.  [employer] Parkinson's Cycling Classes  [employer]:  Thursdays @ Noon-Live classes at [employer] ([employer] at [email or web address] or [phone]) [employer]: Virtual Classes Mondays and Thursdays Jeanette Caprice classes Tuesday, Wednesday and Thursday (contact [place] at [email or web address]  or [phone]) [place] Varied levels of classes are offered Tuesdays and Thursdays at [facility].  Stretching with Verdis Frederickson weekly class is also offered for people with Parkinson's To observe a class or for more information, call [phone] or email Hezzie Bump at [email or web address] ADDITIONAL SUPPORT AND RESOURCES Well-Spring [employer]:Online Caregiver Education Opportunities:  www.well-springsolutions.org/caregiver-education/caregiver-support-group.  You may also contact Vickki Muff at [email or web address] or [phone].    Well-Spring Navigator:  [phone] program, a free service to help individuals and families through the journey of determining care for older adults.  The "Navigator" is a [employer], Education officer, museum, who will speak with a prospective client and/or loved ones to provide an assessment of the situation and a set of recommendations for a personalized care plan -- all free of charge, and whether Well-Spring [employer] offers the needed service or not. If the need is not a service we provide, we are well-connected with reputable programs in town that we can refer you to.  www.well-springsolutions.org or to speak with the Navigator, call [phone]. Family Caregiver Programming in June:  [employer] Against [employer], Thursday, June 15th 11-12:30 at 11-25-1971. [place].  Call [phone] to register

## 2022-05-09 ENCOUNTER — Other Ambulatory Visit: Payer: Self-pay | Admitting: Internal Medicine

## 2022-05-09 ENCOUNTER — Telehealth: Payer: Self-pay | Admitting: Cardiovascular Disease

## 2022-05-09 NOTE — Telephone Encounter (Signed)
Noted. Will address at clinic visit.   Loel Dubonnet, NP

## 2022-05-09 NOTE — Telephone Encounter (Signed)
STAT if HR is under 50 or over 120 (normal HR is 60-100 beats per minute)  What is your heart rate?  Yesterday it was 57, today it is 82  Do you have a log of your heart rate readings (document readings)?   Do you have any other symptoms? No strength in his legs and thighs- saw his Neurologist yesterday and his pulse was 45. She told patient to make an appointment with Dr Gwenlyn Found- I made one for Monday(05-13-22) please call to evaluate

## 2022-05-09 NOTE — Telephone Encounter (Signed)
Lorretta Harp, MD  Fidel Levy, RN Caller: Unspecified (Today,  9:46 AM) That's fine

## 2022-05-09 NOTE — Telephone Encounter (Signed)
Spoke with patient of Dr. Gwenlyn Found  He had a low HR reading at neurologist office (45) [place] - there was [place] slight hill. He needed help getting to his car as his legs were so weak. He said this is not new for him - also has parkinson's.   Generally at home, HR is 50s-60s -- today is 56 He taking amiodarone '200mg'$  daily and eliquis He had pain in his legs prior and held crestor for period of time.  He started back taking this a few days ago.  Advised to hold crestor until his 8/7 appt with Urban Gibson NP Routed to Dr. Gwenlyn Found

## 2022-05-10 ENCOUNTER — Ambulatory Visit (INDEPENDENT_AMBULATORY_CARE_PROVIDER_SITE_OTHER): Payer: PPO | Admitting: Family Medicine

## 2022-05-10 ENCOUNTER — Encounter: Payer: Self-pay | Admitting: Family Medicine

## 2022-05-10 VITALS — BP 110/60 | HR 54 | Temp 98.3°F | Ht 65.0 in | Wt 175.0 lb

## 2022-05-10 DIAGNOSIS — R051 Acute cough: Secondary | ICD-10-CM

## 2022-05-10 DIAGNOSIS — J069 Acute upper respiratory infection, unspecified: Secondary | ICD-10-CM

## 2022-05-10 NOTE — Progress Notes (Signed)
OFFICE VISIT  05/10/2022  CC:  Chief Complaint  Patient presents with   Cough    Has taken Mucinex DM in the past. Cough has been persistent for the past 2 weeks.    Chest congestion   Patient is a 81 y.o. male who presents for cough.,  HPI: Onset close to 2 weeks ago: Nasal congestion and runny nose, postnasal drip, and cough. No fever or shortness of breath or wheezing. Symptoms have gradually improved. Mucinex DM is helpful. He is eating and drinking well.   Past Medical History:  Diagnosis Date   Allergy    Anemia    mild   Anxiety    Arthritis 04/06/2017   Asthma    childhood   ASVD (arteriosclerotic vascular disease) 09/07/2018   Basal cell carcinoma    skin- on nose- basal cell (20 yrs ago) forehead 1 year ago   BPH (benign prostatic hyperplasia)    Bradycardia 06/28/2018   Cerumen impaction 08/10/2012   Chicken pox as child   Coronary artery calcification 11/23/2019   Elevated LFTs    [place] measles as a child   Hyperlipidemia    Hypertension    Internal hemorrhoids    Irregular cardiac rhythm 03/10/2018   Macular degeneration    Major depressive disorder 08/01/2012   Mild neurocognitive disorder due to Parkinson's disease ([place]) 12/27/2019   Obstructive sleep apnea 11/09/2018   Patient reports mild symptoms; was not prescribed a CPAP machine   Otitis externa 08/10/2012   PAF (paroxysmal atrial fibrillation)    Parkinson's disease 05/19/2019   Thrombocytopenia    TIA (transient ischemic attack) 03/10/2018    Past Surgical History:  Procedure Laterality Date   BELPHAROPTOSIS REPAIR     very young, b/l   EXCISIONAL HEMORRHOIDECTOMY     HYDROCELE EXCISION / REPAIR  2012   b/l   SKIN CANCER EXCISION     nose and forehead, basal cell CA   UPPER GASTROINTESTINAL ENDOSCOPY      Outpatient Medications Prior to Visit  Medication Sig Dispense Refill   amiodarone (PACERONE) 200 MG tablet TAKE 1 TABLET BY MOUTH EVERY DAY 90 tablet 0   apixaban (ELIQUIS)  5 MG TABS tablet TAKE 1 TABLET BY MOUTH TWICE A DAY 180 tablet 1   carbidopa-levodopa (SINEMET CR) 50-200 MG tablet TAKE 1 TABLET BY MOUTH EVERYDAY AT BEDTIME 30 tablet 0   carbidopa-levodopa (SINEMET IR) 25-100 MG tablet TAKE 1 TABLET BY MOUTH 3 (THREE) TIMES DAILY. 7AM/11AM/4PM 270 tablet 1   hydrochlorothiazide (HYDRODIURIL) 25 MG tablet TAKE 1 TABLET BY MOUTH EVERY DAY 90 tablet 1   Melatonin 10 MG TABS Take 10 mg by mouth daily as needed.     Multiple Vitamins-Minerals (ICAPS AREDS 2 PO) Take 1 capsule by mouth in the morning and at bedtime.     rosuvastatin (CRESTOR) 5 MG tablet TAKE 1 TABLET BY MOUTH EVERY DAY 90 tablet 1   sertraline (ZOLOFT) 100 MG tablet TAKE 1 TABLET BY MOUTH EVERY DAY 90 tablet 1   terazosin (HYTRIN) 10 MG capsule Take 10 mg by mouth at bedtime.      pantoprazole (PROTONIX) 40 MG tablet Take 1 tablet (40 mg total) by mouth daily. (Patient not taking: Reported on 05/07/2022) 90 tablet 0   No facility-administered medications prior to visit.    Allergies  Allergen Reactions   Albuterol Sulfate Palpitations   Azithromycin Other (See Comments)    Hepatotoxicity   Doxycycline Hyclate Other (See Comments)    Taken  with Azithromycin and had Heaptotoxicity    ROS As per HPI  PE:    05/10/2022    3:33 PM 05/07/2022   12:48 PM 02/11/2022    2:50 PM  Vitals with BMI  Height '5\' 5"'$  '5\' 5"'$    Weight 175 lbs 172 lbs 6 oz   BMI 00.86 76.19   Systolic 509 326 712  Diastolic 60 62 90  Pulse 54 45 52   Physical Exam  VS: noted--normal. Gen: alert, NAD, NONTOXIC APPEARING. HEENT: eyes without injection, drainage, or swelling.  Ears: EACs clear, TMs with normal light reflex and landmarks.  Nose: Clear rhinorrhea, with some dried, crusty exudate adherent to mildly injected mucosa.  No purulent d/c.  No paranasal sinus TTP.  No facial swelling.  Throat and mouth without focal lesion.  No pharyngial swelling, erythema, or exudate.   Neck: supple, no LAD.   LUNGS: CTA bilat,  nonlabored resps.   CV: RRR, no m/r/g. EXT: no c/c/e SKIN: no rash  LABS:  Last CBC Lab Results  Component Value Date   WBC 6.2 02/15/2022   HGB 13.7 02/15/2022   HCT 40.5 02/15/2022   MCV 95.4 02/15/2022   MCH 32.2 10/20/2020   RDW 14.4 02/15/2022   PLT 119.0 (L) 45/80/9983   Last metabolic panel Lab Results  Component Value Date   GLUCOSE 86 01/17/2022   NA 140 01/17/2022   K 4.1 01/17/2022   CL 104 01/17/2022   CO2 31 01/17/2022   BUN 17 01/17/2022   CREATININE 0.95 01/17/2022   GFRNONAA 59 (L) 04/05/2020   CALCIUM 8.6 01/17/2022   PHOS 2.9 06/21/2014   PROT 6.0 01/17/2022   ALBUMIN 4.1 01/17/2022   BILITOT 0.7 01/17/2022   ALKPHOS 61 01/17/2022   AST 15 01/17/2022   ALT 4 01/17/2022   ANIONGAP 12 08/27/2018   IMPRESSION AND PLAN:  URI with cough and congestion. Viral etiology suspected. Gradually improving. We did do a swab for RSV, flu, and COVID today--> sent off to lab.  An After Visit Summary was printed and given to the patient.  FOLLOW UP: Return if symptoms worsen or fail to improve.  Signed:  Crissie Sickles, MD           05/10/2022

## 2022-05-12 ENCOUNTER — Other Ambulatory Visit: Payer: Self-pay | Admitting: Neurology

## 2022-05-13 ENCOUNTER — Ambulatory Visit (HOSPITAL_BASED_OUTPATIENT_CLINIC_OR_DEPARTMENT_OTHER): Payer: PPO | Admitting: Family

## 2022-05-13 ENCOUNTER — Other Ambulatory Visit: Payer: Self-pay | Admitting: Cardiology

## 2022-05-13 ENCOUNTER — Other Ambulatory Visit: Payer: Self-pay | Admitting: Cardiovascular Disease

## 2022-05-13 ENCOUNTER — Encounter (HOSPITAL_BASED_OUTPATIENT_CLINIC_OR_DEPARTMENT_OTHER): Payer: Self-pay | Admitting: Family

## 2022-05-13 VITALS — BP 134/88 | HR 56 | Ht 65.0 in | Wt 175.0 lb

## 2022-05-13 DIAGNOSIS — I10 Essential (primary) hypertension: Secondary | ICD-10-CM | POA: Diagnosis not present

## 2022-05-13 DIAGNOSIS — E785 Hyperlipidemia, unspecified: Secondary | ICD-10-CM

## 2022-05-13 DIAGNOSIS — I25118 Atherosclerotic heart disease of native coronary artery with other forms of angina pectoris: Secondary | ICD-10-CM | POA: Diagnosis not present

## 2022-05-13 LAB — COVID-19, FLU A+B AND RSV
Influenza A, NAA: NOT DETECTED
Influenza B, NAA: NOT DETECTED
RSV, NAA: NOT DETECTED
SARS-CoV-2, NAA: NOT DETECTED

## 2022-05-13 MED ORDER — AMIODARONE HCL 200 MG PO TABS: 100.0000 mg | ORAL_TABLET | Freq: Every day | ORAL | 0 refills | Status: DC

## 2022-05-13 MED ORDER — EZETIMIBE 10 MG PO TABS: 10.0000 mg | ORAL_TABLET | Freq: Every day | ORAL | 3 refills | Status: DC

## 2022-05-13 NOTE — Telephone Encounter (Signed)
Prescription refill request for Eliquis received. Indication: PAF Last office visit: 12/11/21  Adora Fridge MD Scr: 0.95 on 01/17/22 Age:  81 Weight: 77.6kg  Based on above findings Eliquis '5mg'$  twice daily is the appropriate dose.  Refill approved.

## 2022-05-13 NOTE — Progress Notes (Signed)
Office Visit    Patient Name: Walter Brown Date of Encounter: 05/13/2022  PCP:  [name], MD   [facility]  Cardiologist:  Quay Burow, MD  Advanced Practice Provider:  No care team member to display Electrophysiologist:  None   Chief Complaint    Walter Brown is a 81 y.o. male presents today for bradycardia, leg pain   Past Medical History    Past Medical History:  Diagnosis Date   Allergy    Anemia    mild   Anxiety    Arthritis 04/06/2017   Asthma    childhood   ASVD (arteriosclerotic vascular disease) 09/07/2018   Basal cell carcinoma    skin- on nose- basal cell (20 yrs ago) forehead 1 year ago   BPH (benign prostatic hyperplasia)    Bradycardia 06/28/2018   Cerumen impaction 08/10/2012   Chicken pox as child   Coronary artery calcification 11/23/2019   Elevated LFTs    [place] measles as a child   Hyperlipidemia    Hypertension    Internal hemorrhoids    Irregular cardiac rhythm 03/10/2018   Macular degeneration    Major depressive disorder 08/01/2012   Mild neurocognitive disorder due to Parkinson's disease ([place]) 12/27/2019   Obstructive sleep apnea 11/09/2018   Patient reports mild symptoms; was not prescribed a CPAP machine   Otitis externa 08/10/2012   PAF (paroxysmal atrial fibrillation)    Parkinson's disease 05/19/2019   Thrombocytopenia    TIA (transient ischemic attack) 03/10/2018   Past Surgical History:  Procedure Laterality Date   BELPHAROPTOSIS REPAIR     very young, b/l   EXCISIONAL HEMORRHOIDECTOMY     HYDROCELE EXCISION / REPAIR  2012   b/l   SKIN CANCER EXCISION     nose and forehead, basal cell CA   UPPER GASTROINTESTINAL ENDOSCOPY      Allergies  Allergies  Allergen Reactions   Albuterol Sulfate Palpitations   Azithromycin Other (See Comments)    Hepatotoxicity   Doxycycline Hyclate Other (See Comments)    Taken with Azithromycin and had Heaptotoxicity    History of Present  Illness    Walter Brown is a 81 y.o. male with a hx of hypertension, hyperlipidemia, Parkinson's, CAD, PAF last seen 12/11/2021.  At last clinic visit 12/11/2021 predominant complaint of Parkinson's disease but doing well from a cardiac perspective.  Did have episode of likely PAF a year prior but were rare.  Prior CT chest 09/2019 with heavy coronary calcification but negative exercise Myoview 04/23/2018.  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  Presents today for follow-up with his wife.  He and his wife usually take a 45-minute walk however 1 day last week his wife had an appointment so he took a walk unintentionally for 3 hours on his own.  Afterwards felt his heart racing, very fatigued, diaphoretic.  He was able to recover after resting for a couple of hours. He notes leg pain in his bilateral thighs that has worsened since resuming Crestor. He did have a low heart rate reading in neurologist office of 45 bpm - not associated with any symptoms - since that time has been 50s-70s.   EKGs/Labs/Other Studies Reviewed:   The following studies were reviewed today:  EKG:  EKG is ordered today.  The ekg ordered today demonstrates SB 56 bpm.   Recent Labs: 01/17/2022: ALT 4; BUN 17; Creatinine,  Ser 0.95; Potassium 4.1; Sodium 140; TSH 1.27 02/15/2022: Hemoglobin 13.7; Platelets 119.0  Recent Lipid Panel    Component Value Date/Time   CHOL 146 01/17/2022 1208   CHOL 132 04/05/2020 0849   TRIG 69.0 01/17/2022 1208   HDL 72.30 01/17/2022 1208   HDL 72 04/05/2020 0849   CHOLHDL 2 01/17/2022 1208   VLDL 13.8 01/17/2022 1208   LDLCALC 59 01/17/2022 1208   LDLCALC 36 10/20/2020 1426    Risk Assessment/Calculations:   CHA2DS2-VASc Score = 6   This indicates a 9.7% annual risk of stroke. The patient's score is based upon: CHF History: 0 HTN History: 1 Diabetes History: 0 Stroke History: 2 Vascular Disease History:  1 Age Score: 2 Gender Score: 0     Home Medications   Current Meds  Medication Sig   amiodarone (PACERONE) 200 MG tablet TAKE 1 TABLET BY MOUTH EVERY DAY   carbidopa-levodopa (SINEMET CR) 50-200 MG tablet TAKE 1 TABLET BY MOUTH EVERYDAY AT BEDTIME   carbidopa-levodopa (SINEMET IR) 25-100 MG tablet TAKE 1 TABLET BY MOUTH 3 (THREE) TIMES DAILY. 7AM/11AM/4PM   ELIQUIS 5 MG TABS tablet TAKE 1 TABLET BY MOUTH TWICE A DAY   hydrochlorothiazide (HYDRODIURIL) 25 MG tablet TAKE 1 TABLET BY MOUTH EVERY DAY   Melatonin 10 MG TABS Take 10 mg by mouth daily as needed.   Multiple Vitamins-Minerals (ICAPS AREDS 2 PO) Take 1 capsule by mouth in the morning and at bedtime.   rosuvastatin (CRESTOR) 5 MG tablet TAKE 1 TABLET BY MOUTH EVERY DAY   sertraline (ZOLOFT) 100 MG tablet TAKE 1 TABLET BY MOUTH EVERY DAY   terazosin (HYTRIN) 10 MG capsule Take 10 mg by mouth at bedtime.      Review of Systems     All other systems reviewed and are otherwise negative except as noted above.  Physical Exam    VS:  BP 134/88   Pulse (!) 56   Ht '5\' 5"'$  (1.651 m)   Wt 175 lb (79.4 kg)   BMI 29.12 kg/m  , BMI Body mass index is 29.12 kg/m.  Wt Readings from Last 3 Encounters:  05/13/22 175 lb (79.4 kg)  05/10/22 175 lb (79.4 kg)  05/07/22 172 lb 6.4 oz (78.2 kg)     GEN: Well nourished, well developed, in no acute distress. HEENT: normal. Neck: Supple, no JVD, carotid bruits, or masses. Cardiac: RRR, no murmurs, rubs, or gallops. No clubbing, cyanosis, edema.  Radials/PT 2+ and equal bilaterally.  Respiratory:  Respirations regular and unlabored, clear to auscultation bilaterally. GI: Soft, nontender, nondistended. MS: No deformity or atrophy. Skin: Warm and dry, no rash. Neuro:  Strength and sensation are intact. Psych: Normal affect.  Assessment & Plan    CAD / HLD, LDL goal <70 - Stable with no anginal symptoms. No indication for ischemic evaluation. No ASA due to [facility]. Leg pain with Crestor.  Will discontinue Crestor and start Zetia '10mg'$  daily. Consider repeat lipid at follow up. Heart healthy diet and regular cardiovascular exercise encouraged.  I anticipate his significant fatigue with recent exercise episode is due to the duration of 3 hours (more than double the length of his usual walk) and not angina. His EKG today shows no acute ST/T wave changes. If recurs, could consider myoview vs cardiac CTA.   HTN - BP well controlled. Continue current antihypertensive regimen HCTZ '25mg'$  QD.   PAF / Hypercoagulable state / On Amiodarone therapy - No recent palpitations. Given recent bradycardia, reduce Amiodarone to '100mg'$   QD. Anticipate some autonomic dysfunction given Parkinsons contributory to bradycardia. Not on AV nodal blocking agent. CHA2DS2-VASc Score = 6 [CHF History: 0, HTN History: 1, Diabetes History: 0, Stroke History: 2, Vascular Disease History: 1, Age Score: 2, Gender Score: 0].  Therefore, the patient's annual risk of stroke is 9.7 %.    Continue Eliquis '5mg'$  BID. Does not meet dose reduction criteria. Denies bleeding complications.         Disposition: Follow up in 3 month(s) with Quay Burow, MD or APP.  Signed, Loel Dubonnet, NP 05/13/2022, 3:15 PM [facility]

## 2022-05-13 NOTE — Patient Instructions (Signed)
Medication Instructions:  Your physician has recommended you make the following change in your medication:   STOP Rosuvastatin (Crestor)  REDUCE Amiodarone to '100mg'$  (half tablet) daily  START Zetia '10mg'$  daily  *If you need a refill on your cardiac medications before your next appointment, please call your pharmacy*   Lab Work: None ordered today  Testing/Procedures: Your EKG today showed sinus bradycardia which is a stable finding.    Follow-Up: At [facility], you and your health needs are our priority.  As part of our continuing mission to provide you with exceptional heart care, we have created designated Provider Care Teams.  These Care Teams include your primary Cardiologist (physician) and Advanced Practice Providers (APPs -  Physician Assistants and Nurse Practitioners) who all work together to provide you with the care you need, when you need it.  We recommend signing up for the patient portal called "MyChart".  Sign up information is provided on this After Visit Summary.  MyChart is used to connect with patients for Virtual Visits (Telemedicine).  Patients are able to view lab/test results, encounter notes, upcoming appointments, etc.  Non-urgent messages can be sent to your provider as well.   To learn more about what you can do with MyChart, go to [email or web address].    Your next appointment:   3 month(s)  The format for your next appointment:   In Person  Provider:   Quay Burow, MD  or Laurann Montana, NP {  Other Instructions Heart Healthy Diet Recommendations: A low-salt diet is recommended. Meats should be grilled, baked, or boiled. Avoid fried foods. Focus on lean protein sources like fish or chicken with vegetables and fruits. The American Heart Association is a Microbiologist!  American Heart Association Diet and Lifeystyle Recommendations   Exercise recommendations: The American Heart Association recommends 150 minutes of moderate intensity  exercise weekly. Try 30 minutes of moderate intensity exercise 4-5 times per week. This could include walking, jogging, or swimming.   Important Information About Sugar

## 2022-05-30 ENCOUNTER — Ambulatory Visit (INDEPENDENT_AMBULATORY_CARE_PROVIDER_SITE_OTHER): Payer: PPO | Admitting: Family Medicine

## 2022-05-30 VITALS — BP 132/80 | HR 52 | Temp 98.0°F | Wt 174.2 lb

## 2022-05-30 DIAGNOSIS — I10 Essential (primary) hypertension: Secondary | ICD-10-CM

## 2022-05-30 DIAGNOSIS — E782 Mixed hyperlipidemia: Secondary | ICD-10-CM | POA: Diagnosis not present

## 2022-05-30 DIAGNOSIS — D696 Thrombocytopenia, unspecified: Secondary | ICD-10-CM

## 2022-05-30 DIAGNOSIS — I480 Paroxysmal atrial fibrillation: Secondary | ICD-10-CM

## 2022-05-30 DIAGNOSIS — R739 Hyperglycemia, unspecified: Secondary | ICD-10-CM

## 2022-05-30 DIAGNOSIS — G20 Parkinson's disease: Secondary | ICD-10-CM

## 2022-05-30 LAB — CBC WITH DIFFERENTIAL/PLATELET
Basophils Absolute: 0.1 10*3/uL (ref 0.0–0.1)
Basophils Relative: 1.1 % (ref 0.0–3.0)
Eosinophils Absolute: 0.2 10*3/uL (ref 0.0–0.7)
Eosinophils Relative: 2.5 % (ref 0.0–5.0)
HCT: 40.7 % (ref 39.0–52.0)
Hemoglobin: 13.6 g/dL (ref 13.0–17.0)
Lymphocytes Relative: 21.7 % (ref 12.0–46.0)
Lymphs Abs: 1.4 10*3/uL (ref 0.7–4.0)
MCHC: 33.3 g/dL (ref 30.0–36.0)
MCV: 95.1 fl (ref 78.0–100.0)
Monocytes Absolute: 0.6 10*3/uL (ref 0.1–1.0)
Monocytes Relative: 9.3 % (ref 3.0–12.0)
Neutro Abs: 4.1 10*3/uL (ref 1.4–7.7)
Neutrophils Relative %: 65.4 % (ref 43.0–77.0)
Platelets: 109 10*3/uL — ABNORMAL LOW (ref 150.0–400.0)
RBC: 4.28 Mil/uL (ref 4.22–5.81)
RDW: 14.6 % (ref 11.5–15.5)
WBC: 6.3 10*3/uL (ref 4.0–10.5)

## 2022-05-30 LAB — COMPREHENSIVE METABOLIC PANEL
ALT: 5 U/L (ref 0–53)
AST: 14 U/L (ref 0–37)
Albumin: 4.1 g/dL (ref 3.5–5.2)
Alkaline Phosphatase: 60 U/L (ref 39–117)
BUN: 14 mg/dL (ref 6–23)
CO2: 32 mEq/L (ref 19–32)
Calcium: 8.8 mg/dL (ref 8.4–10.5)
Chloride: 103 mEq/L (ref 96–112)
Creatinine, Ser: 0.99 mg/dL (ref 0.40–1.50)
GFR: 71.63 mL/min (ref >60.00)
Glucose, Bld: 84 mg/dL (ref 70–99)
Potassium: 3.6 mEq/L (ref 3.5–5.1)
Sodium: 142 mEq/L (ref 135–145)
Total Bilirubin: 0.8 mg/dL (ref 0.2–1.2)
Total Protein: 6.3 g/dL (ref 6.0–8.3)

## 2022-05-30 LAB — LIPID PANEL
Cholesterol: 158 mg/dL (ref 0–200)
HDL: 71 mg/dL (ref >39.00)
LDL Cholesterol: 73 mg/dL (ref 0–99)
NonHDL: 87.17
Total CHOL/HDL Ratio: 2
Triglycerides: 73 mg/dL (ref 0.0–149.0)
VLDL: 14.6 mg/dL (ref 0.0–40.0)

## 2022-05-30 LAB — HEMOGLOBIN A1C: Hgb A1c MFr Bld: 5.3 % (ref 4.6–6.5)

## 2022-05-30 LAB — TSH: TSH: 1.84 u[IU]/mL (ref 0.35–5.50)

## 2022-05-30 NOTE — Patient Instructions (Addendum)
RSV (Respiratory Syncitial Virus) vaccine at pharmacy ]New Covid Shot in early October or last September  High dose flu shot in September or October  Plain Mucinex twice daily x 7 days  ARDS daily is good make sure it has zinc in it  Encouraged increased rest and hydration, add probiotics, zinc such as Coldeze or Xicam. Treat fevers as needed   Whey powder, egg white powder, collagen powder, yellow split powder  Orgain protein drinks  Cough, Adult Coughing is a reflex that clears your throat and your airways (respiratory system). Coughing helps to heal and protect your lungs. It is normal to cough occasionally, but a cough that happens with other symptoms or lasts a long time may be a sign of a condition that needs treatment. An acute cough may only last 2-3 weeks, while a chronic cough may last 8 or more weeks. Coughing is commonly caused by: Infection of the respiratory systemby viruses or bacteria. Breathing in substances that irritate your lungs. Allergies. Asthma. Mucus that runs down the back of your throat (postnasal drip). Smoking. Acid backing up from the stomach into the esophagus (gastroesophageal reflux). Certain medicines. Chronic lung problems. Other medical conditions such as heart failure or a blood clot in the lung (pulmonary embolism). Follow these instructions at home: Medicines Take over-the-counter and prescription medicines only as told by your health care provider. Talk with your health care provider before you take a cough suppressant medicine. Lifestyle  Avoid cigarette smoke. Do not use any products that contain nicotine or tobacco, such as cigarettes, e-cigarettes, and chewing tobacco. If you need help quitting, ask your health care provider. Drink enough fluid to keep your urine pale yellow. Avoid caffeine. Do not drink alcohol if your health care provider tells you not to drink. General instructions  Pay close attention to changes in your cough.  Tell your health care provider about them. Always cover your mouth when you cough. Avoid things that make you cough, such as perfume, candles, cleaning products, or campfire or tobacco smoke. If the air is dry, use a cool mist vaporizer or humidifier in your bedroom or your home to help loosen secretions. If your cough is worse at night, try to sleep in a semi-upright position. Rest as needed. Keep all follow-up visits as told by your health care provider. This is important. Contact a health care provider if you: Have new symptoms. Cough up pus. Have a cough that does not get better after 2-3 weeks or gets worse. Cannot control your cough with cough suppressant medicines and you are losing sleep. Have pain that gets worse or pain that is not helped with medicine. Have a fever. Have unexplained weight loss. Have night sweats. Get help right away if: You cough up blood. You have difficulty breathing. Your heartbeat is very fast. These symptoms may represent a serious problem that is an emergency. Do not wait to see if the symptoms will go away. Get medical help right away. Call your local emergency services (911 in the [place] Do not drive yourself to the hospital. Summary Coughing is a reflex that clears your throat and your airways. It is normal to cough occasionally, but a cough that happens with other symptoms or lasts a long time may be a sign of a condition that needs treatment. Take over-the-counter and prescription medicines only as told by your health care provider. Always cover your mouth when you cough. Contact a health care provider if you have new symptoms or a cough that  does not get better after 2-3 weeks or gets worse. This information is not intended to replace advice given to you by your health care provider. Make sure you discuss any questions you have with your health care provider. Document Revised: 10/12/2018 Document Reviewed: 10/12/2018 Elsevier Patient Education   [place].

## 2022-05-30 NOTE — Progress Notes (Signed)
Subjective:   By signing my name below, I, Kellie Simmering, attest that this documentation has been prepared under the direction and in the presence of [name], MD 05/30/2022.     Patient ID: Walter Brown, male    DOB: April 19, 1941, 81 y.o.   MRN: [id number]  Chief Complaint  Patient presents with   Follow-up    Here for 4 month follow up   HPI Patient is in today for an office visit.  Immunizations: He is inquiring about receiving COVID-19, high-dose Flu, and RSV vaccinations.   Stomach infection: He reports that he saw Dr. Anitra Lauth  on 05/10/2022 due to a cough. He had an endoscopy and was diagnosed with a stomach infection. He states that he is producing grey sputum.   Cardiology: He states that he has a an appointment scheduled in 08/2022 with his cardiologist, Dr. Gwenlyn Found.   Neurology: He states that he has an appointment scheduled in 11/2022 with his neurologist, Dr. Carles Collet.   Supplements: He reports that he currently takes multivitamins and protein shakes.   Muscle: He states that he has lost muscle mass.   Ear: He reports having wax buildup in his ears. He states that he sleeps on his right side with his right ear against his pillow.   Bowel movement: He states that he takes Senna to help with his bowel movements.   Past Medical History:  Diagnosis Date   Allergy    Anemia    mild   Anxiety    Arthritis 04/06/2017   Asthma    childhood   ASVD (arteriosclerotic vascular disease) 09/07/2018   Basal cell carcinoma    skin- on nose- basal cell (20 yrs ago) forehead 1 year ago   BPH (benign prostatic hyperplasia)    Bradycardia 06/28/2018   Cerumen impaction 08/10/2012   Chicken pox as child   Coronary artery calcification 11/23/2019   Elevated LFTs    [place] measles as a child   Hyperlipidemia    Hypertension    Internal hemorrhoids    Irregular cardiac rhythm 03/10/2018   Macular degeneration    Major depressive disorder 08/01/2012   Mild neurocognitive  disorder due to Parkinson's disease ([place]) 12/27/2019   Obstructive sleep apnea 11/09/2018   Patient reports mild symptoms; was not prescribed a CPAP machine   Otitis externa 08/10/2012   PAF (paroxysmal atrial fibrillation)    Parkinson's disease 05/19/2019   Thrombocytopenia    TIA (transient ischemic attack) 03/10/2018   Past Surgical History:  Procedure Laterality Date   BELPHAROPTOSIS REPAIR     very young, b/l   EXCISIONAL HEMORRHOIDECTOMY     HYDROCELE EXCISION / REPAIR  2012   b/l   SKIN CANCER EXCISION     nose and forehead, basal cell CA   UPPER GASTROINTESTINAL ENDOSCOPY     Family History  Problem Relation Age of Onset   Hypertension Mother    Other Mother        MRSA   Heart disease Mother        stents   Heart disease Father    Hypertension Father    Prostate cancer Father 64   Parkinson's disease Father    Stroke Maternal Grandmother    Prostate cancer Maternal Grandfather    Cancer Paternal Grandfather        EYE   ADD / ADHD Son        ADHD   Other Son 24       part  of 1 lung removed- due to infection   Colon cancer Neg Hx    Esophageal cancer Neg Hx    Rectal cancer Neg Hx    Stomach cancer Neg Hx    Social History   Socioeconomic History   Marital status: Married    Spouse name: Not on file   Number of children: 2   Years of education: 16   Highest education level: Bachelor's degree (e.g., BA, AB, BS)  Occupational History   Occupation: retired  Tobacco Use   Smoking status: Former    Packs/day: 1.50    Years: 15.00    Total pack years: 22.50    Types: Cigarettes    Start date: 10/07/1978   Smokeless tobacco: Never  Vaping Use   Vaping Use: Never used  Substance and Sexual Activity   Alcohol use: Yes    Alcohol/week: 7.0 - 14.0 standard drinks of alcohol    Types: 7 - 14 Glasses of wine per week    Comment: 1-2 glasses of wine with dinner   Drug use: No   Sexual activity: Yes    Comment: lives with wife, retired from Training and development officer in [employer] , 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  Other Topics Concern   Not on file  Social History Narrative   Right handed    Social Determinants of Health   Financial Resource Strain: Low Risk  (03/26/2022)   Overall Financial Resource Strain (CARDIA)    Difficulty of Paying Living Expenses: Not hard at all  Food Insecurity: No Food Insecurity (03/26/2022)   Hunger Vital Sign    Worried About Running Out of Food in the Last Year: Never true    Ran Out of Food in the Last Year: Never true  Transportation Needs: No Transportation Needs (03/26/2022)   PRAPARE - Hydrologist (Medical): No    Lack of Transportation (Non-Medical): No  Physical Activity: Sufficiently Active (03/26/2022)   Exercise Vital Sign    Days of Exercise per Week: 5 days    Minutes of Exercise per Session: 50 min  Stress: Stress Concern Present (03/26/2022)   [place]    Feeling of Stress : To some extent  Social Connections: Moderately Isolated (03/26/2022)   Social Connection and Isolation Panel [NHANES]    Frequency of Communication with Friends and Family: More than three times a week    Frequency of Social Gatherings with Friends and Family: Once a week    Attends Religious Services: Never    Marine scientist or Organizations: No    Attends Archivist Meetings: Never    Marital Status: Married  Human resources officer Violence: Not At Risk (03/26/2022)   Humiliation, Afraid, Rape, and Kick questionnaire    Fear of Current or Ex-Partner: No    Emotionally Abused: No    Physically Abused: No    Sexually Abused: No   Outpatient Medications Prior to Visit  Medication Sig Dispense Refill   amiodarone (PACERONE) 200 MG tablet Take 0.5 tablets (100 mg total) by mouth daily. 90 tablet 0   carbidopa-levodopa (SINEMET CR) 50-200 MG tablet TAKE 1 TABLET BY MOUTH EVERYDAY AT BEDTIME 30 tablet 0    carbidopa-levodopa (SINEMET IR) 25-100 MG tablet TAKE 1 TABLET BY MOUTH 3 (THREE) TIMES DAILY. 7AM/11AM/4PM 270 tablet 1   ELIQUIS 5 MG TABS tablet TAKE 1 TABLET BY MOUTH TWICE A DAY 180 tablet 1   ezetimibe (ZETIA)  10 MG tablet Take 1 tablet (10 mg total) by mouth daily. 90 tablet 3   hydrochlorothiazide (HYDRODIURIL) 25 MG tablet TAKE 1 TABLET BY MOUTH EVERY DAY 90 tablet 1   Melatonin 10 MG TABS Take 10 mg by mouth daily as needed.     Multiple Vitamins-Minerals (ICAPS AREDS 2 PO) Take 1 capsule by mouth in the morning and at bedtime.     sertraline (ZOLOFT) 100 MG tablet TAKE 1 TABLET BY MOUTH EVERY DAY 90 tablet 1   terazosin (HYTRIN) 10 MG capsule Take 10 mg by mouth at bedtime.      No facility-administered medications prior to visit.   Allergies  Allergen Reactions   Albuterol Sulfate Palpitations   Azithromycin Other (See Comments)    Hepatotoxicity   Doxycycline Hyclate Other (See Comments)    Taken with Azithromycin and had Heaptotoxicity   Review of Systems  Respiratory:  Positive for sputum production (Grey coloration).       Objective:    Physical Exam Constitutional:      General: He is not in acute distress.    Appearance: Normal appearance. He is not ill-appearing.  HENT:     Head: Normocephalic and atraumatic.     Right Ear: External ear normal.     Left Ear: External ear normal.     Mouth/Throat:     Mouth: Mucous membranes are moist.     Pharynx: Oropharynx is clear.  Eyes:     Extraocular Movements: Extraocular movements intact.     Pupils: Pupils are equal, round, and reactive to light.  Neck:     Vascular: No carotid bruit.  Cardiovascular:     Rate and Rhythm: Normal rate and regular rhythm.     Pulses: Normal pulses.     Heart sounds: Normal heart sounds. No murmur heard.    No gallop.  Pulmonary:     Effort: Pulmonary effort is normal. No respiratory distress.     Breath sounds: Normal breath sounds. No wheezing or rales.  Abdominal:      General: Bowel sounds are normal.  Lymphadenopathy:     Cervical: No cervical adenopathy.  Skin:    General: Skin is warm and dry.  Neurological:     Mental Status: He is alert and oriented to person, place, and time.  Psychiatric:        Mood and Affect: Mood normal.        Behavior: Behavior normal.        Judgment: Judgment normal.    BP 132/80 (BP Location: Right Arm, Patient Position: Sitting, Cuff Size: Normal)   Pulse (!) 52   Temp 98 F (36.7 C) (Oral)   Wt 174 lb 3.2 oz (79 kg)   SpO2 98%   BMI 28.99 kg/m  Wt Readings from Last 3 Encounters:  05/30/22 174 lb 3.2 oz (79 kg)  05/13/22 175 lb (79.4 kg)  05/10/22 175 lb (79.4 kg)   Diabetic Foot Exam - Simple   No data filed    Lab Results  Component Value Date   WBC 6.3 05/30/2022   HGB 13.6 05/30/2022   HCT 40.7 05/30/2022   PLT 109.0 (L) 05/30/2022   GLUCOSE 84 05/30/2022   CHOL 158 05/30/2022   TRIG 73.0 05/30/2022   HDL 71.00 05/30/2022   LDLCALC 73 05/30/2022   ALT 5 05/30/2022   AST 14 05/30/2022   NA 142 05/30/2022   K 3.6 05/30/2022   CL 103 05/30/2022   CREATININE 0.99  05/30/2022   BUN 14 05/30/2022   CO2 32 05/30/2022   TSH 1.84 05/30/2022   INR 1.0 12/15/2017   HGBA1C 5.3 05/30/2022   Lab Results  Component Value Date   TSH 1.84 05/30/2022   Lab Results  Component Value Date   WBC 6.3 05/30/2022   HGB 13.6 05/30/2022   HCT 40.7 05/30/2022   MCV 95.1 05/30/2022   PLT 109.0 (L) 05/30/2022   Lab Results  Component Value Date   NA 142 05/30/2022   K 3.6 05/30/2022   CO2 32 05/30/2022   GLUCOSE 84 05/30/2022   BUN 14 05/30/2022   CREATININE 0.99 05/30/2022   BILITOT 0.8 05/30/2022   ALKPHOS 60 05/30/2022   AST 14 05/30/2022   ALT 5 05/30/2022   PROT 6.3 05/30/2022   ALBUMIN 4.1 05/30/2022   CALCIUM 8.8 05/30/2022   ANIONGAP 12 08/27/2018   GFR 71.63 05/30/2022   Lab Results  Component Value Date   CHOL 158 05/30/2022   Lab Results  Component Value Date   HDL 71.00  05/30/2022   Lab Results  Component Value Date   LDLCALC 73 05/30/2022   Lab Results  Component Value Date   TRIG 73.0 05/30/2022   Lab Results  Component Value Date   CHOLHDL 2 05/30/2022   Lab Results  Component Value Date   HGBA1C 5.3 05/30/2022      Assessment & Plan:   Problem List Items Addressed This Visit     Thrombocytopenia ([place])   Relevant Orders   CBC w/Diff (Completed)   Hyperlipidemia - Primary    Tolerating statin, encouraged heart healthy diet, avoid trans fats, minimize simple carbs and saturated fats. Increase exercise as tolerated      Relevant Orders   Lipid panel (Completed)   Hypertension    Well controlled, no changes to meds. Encouraged heart healthy diet such as the DASH diet and exercise as tolerated.       Relevant Orders   Comprehensive metabolic panel (Completed)   TSH (Completed)   PAF (paroxysmal atrial fibrillation) ([place])    Rate controlled, tolerating Eliquis      Parkinson's disease ([place])    Following with neurology and stable on current medications.       Hyperglycemia    hgba1c acceptable, minimize simple carbs. Increase exercise as tolerated.       Relevant Orders   Hemoglobin A1c (Completed)   No orders of the defined types were placed in this encounter.  I, Penni Homans, MD, personally preformed the services described in this documentation.  All medical record entries made by the scribe were at my direction and in my presence.  I have reviewed the chart and discharge instructions (if applicable) and agree that the record reflects my personal performance and is accurate and complete. 05/30/2022  I,Mohammed Iqbal,acting as a scribe for Penni Homans, MD.,have documented all relevant documentation on the behalf of Penni Homans, MD,as directed by  Penni Homans, MD while in the presence of Penni Homans, MD.  Penni Homans, MD

## 2022-05-31 NOTE — Assessment & Plan Note (Signed)
Tolerating statin, encouraged heart healthy diet, avoid trans fats, minimize simple carbs and saturated fats. Increase exercise as tolerated 

## 2022-05-31 NOTE — Assessment & Plan Note (Signed)
Following with neurology and stable on current medications.

## 2022-05-31 NOTE — Assessment & Plan Note (Signed)
hgba1c acceptable, minimize simple carbs. Increase exercise as tolerated.  

## 2022-05-31 NOTE — Assessment & Plan Note (Signed)
Well controlled, no changes to meds. Encouraged heart healthy diet such as the DASH diet and exercise as tolerated.  °

## 2022-05-31 NOTE — Assessment & Plan Note (Signed)
Rate controlled, tolerating Eliquis 

## 2022-06-04 ENCOUNTER — Other Ambulatory Visit: Payer: Self-pay | Admitting: Neurology

## 2022-06-04 DIAGNOSIS — G20 Parkinson's disease: Secondary | ICD-10-CM

## 2022-06-07 ENCOUNTER — Other Ambulatory Visit: Payer: Self-pay | Admitting: Family Medicine

## 2022-06-12 ENCOUNTER — Other Ambulatory Visit: Payer: Self-pay | Admitting: Internal Medicine

## 2022-06-14 ENCOUNTER — Telehealth: Payer: Self-pay | Admitting: Family Medicine

## 2022-06-14 NOTE — Telephone Encounter (Signed)
[name] called wanting to know the status of the walker that was supposed to be sent for her husband. She stated a script was supposed to be sent to medical supplies. Please advise.

## 2022-06-17 ENCOUNTER — Other Ambulatory Visit: Payer: Self-pay | Admitting: Family Medicine

## 2022-06-17 ENCOUNTER — Telehealth: Payer: Self-pay | Admitting: Family Medicine

## 2022-06-17 DIAGNOSIS — R531 Weakness: Secondary | ICD-10-CM

## 2022-06-17 NOTE — Telephone Encounter (Signed)
Called pt VM fill sent a message to pt

## 2022-06-17 NOTE — Telephone Encounter (Signed)
Order has been faxed

## 2022-06-17 NOTE — Telephone Encounter (Signed)
Patient would like prescription for walker to be faxed to [facility]. Their phone number is [phone]

## 2022-06-18 NOTE — Telephone Encounter (Signed)
Refax it just now

## 2022-06-18 NOTE — Telephone Encounter (Signed)
Patient called from [facility] stating they have not received the faxed form yet. They would like for it to be faxed asap since they are there now. Their fax number is [phone]. Please advise.

## 2022-06-24 ENCOUNTER — Other Ambulatory Visit: Payer: Self-pay | Admitting: Family Medicine

## 2022-06-24 ENCOUNTER — Encounter: Payer: Self-pay | Admitting: Family Medicine

## 2022-06-24 DIAGNOSIS — G20 Parkinson's disease: Secondary | ICD-10-CM

## 2022-06-27 DIAGNOSIS — H35372 Puckering of macula, left eye: Secondary | ICD-10-CM | POA: Diagnosis not present

## 2022-06-27 DIAGNOSIS — H353112 Nonexudative age-related macular degeneration, right eye, intermediate dry stage: Secondary | ICD-10-CM | POA: Diagnosis not present

## 2022-06-27 DIAGNOSIS — H43813 Vitreous degeneration, bilateral: Secondary | ICD-10-CM | POA: Diagnosis not present

## 2022-06-27 DIAGNOSIS — H353221 Exudative age-related macular degeneration, left eye, with active choroidal neovascularization: Secondary | ICD-10-CM | POA: Diagnosis not present

## 2022-06-27 NOTE — Telephone Encounter (Signed)
[facility] called to let us know that Patient's insurance ([employer]) is out of network with them and we will need to refer him to someone else. Please advise.

## 2022-07-23 DIAGNOSIS — R3912 Poor urinary stream: Secondary | ICD-10-CM | POA: Diagnosis not present

## 2022-07-23 DIAGNOSIS — N401 Enlarged prostate with lower urinary tract symptoms: Secondary | ICD-10-CM | POA: Diagnosis not present

## 2022-07-23 DIAGNOSIS — R3915 Urgency of urination: Secondary | ICD-10-CM | POA: Diagnosis not present

## 2022-07-31 DIAGNOSIS — L578 Other skin changes due to chronic exposure to nonionizing radiation: Secondary | ICD-10-CM | POA: Diagnosis not present

## 2022-07-31 DIAGNOSIS — D225 Melanocytic nevi of trunk: Secondary | ICD-10-CM | POA: Diagnosis not present

## 2022-07-31 DIAGNOSIS — D044 Carcinoma in situ of skin of scalp and neck: Secondary | ICD-10-CM | POA: Diagnosis not present

## 2022-07-31 DIAGNOSIS — D485 Neoplasm of uncertain behavior of skin: Secondary | ICD-10-CM | POA: Diagnosis not present

## 2022-07-31 DIAGNOSIS — L570 Actinic keratosis: Secondary | ICD-10-CM | POA: Diagnosis not present

## 2022-07-31 DIAGNOSIS — L814 Other melanin hyperpigmentation: Secondary | ICD-10-CM | POA: Diagnosis not present

## 2022-07-31 DIAGNOSIS — Z23 Encounter for immunization: Secondary | ICD-10-CM | POA: Diagnosis not present

## 2022-07-31 DIAGNOSIS — Z86018 Personal history of other benign neoplasm: Secondary | ICD-10-CM | POA: Diagnosis not present

## 2022-07-31 DIAGNOSIS — L219 Seborrheic dermatitis, unspecified: Secondary | ICD-10-CM | POA: Diagnosis not present

## 2022-07-31 DIAGNOSIS — Z85828 Personal history of other malignant neoplasm of skin: Secondary | ICD-10-CM | POA: Diagnosis not present

## 2022-07-31 DIAGNOSIS — L821 Other seborrheic keratosis: Secondary | ICD-10-CM | POA: Diagnosis not present

## 2022-08-01 ENCOUNTER — Telehealth: Payer: Self-pay | Admitting: Neurology

## 2022-08-01 NOTE — Telephone Encounter (Signed)
[name] came into the office and wanted to make an appointment to discuss patient's behavior, but patient's wife wanted to meet with me without the patient present at all (she did not want to bring the patient to the office, and she wanted an appointment for herself to discuss the patient).  Staff discussed with her that we cannot have an appointment with a spouse and bill insurance for the patient.  She instead wrote a note and asked [place] to email her privately which we can also not do because that is not HIPAA compliant.  She did not want [place] to call, because she felt that he would find out, but I will have the staff call and try to leave a message and she can call back at her convenience.  She left a note stating that the patient believes that she has cheated on him and he has accused her of giving him HIV.  He mainly becomes paranoid with this behavior at night.  Unfortunately, she has never addressed this in the office, and really does not want to address this at all, making treatment very difficult.  Chel[place], please call the patient's wife and you can leave a message and have her call back when she feels safe to do so.  Tell her that we cannot email her privately because that is not HIPAA compliant.  Tell her that we can redo his neurocognitive testing since it has been several years, but the wait to do so is very long.  It is certainly possible that the patient has developed a dementia, and we can trial medication, but obviously we would need to be able to discuss these things in the room.  We can discuss these in a sensitive way, but obviously I would need permission to do so.

## 2022-08-02 ENCOUNTER — Telehealth: Payer: Self-pay

## 2022-08-02 NOTE — Telephone Encounter (Signed)
Spoke with pt wife who stated that the pt was having verbal violent outburst but has not been physical, her son was also on the line who is from [place] they stated that the out burst are worse when he has trouble remembering things they are asking for medication and help for the out burst. Pt wife was advised for urgent help with the out burst she needs to take the pt to the ER to get help. I told them that [facility] has the behavior health, in there ER they did not say if they would take the pt but thankful for the information,

## 2022-08-02 NOTE — Telephone Encounter (Signed)
Called patients wife and left message

## 2022-08-09 ENCOUNTER — Other Ambulatory Visit: Payer: Self-pay | Admitting: Family Medicine

## 2022-08-13 ENCOUNTER — Ambulatory Visit: Payer: PPO | Admitting: Physician Assistant

## 2022-08-13 ENCOUNTER — Ambulatory Visit: Payer: PPO | Attending: Cardiovascular Disease | Admitting: Cardiovascular Disease

## 2022-08-13 ENCOUNTER — Encounter: Payer: Self-pay | Admitting: Cardiovascular Disease

## 2022-08-13 ENCOUNTER — Encounter: Payer: Self-pay | Admitting: Physician Assistant

## 2022-08-13 ENCOUNTER — Other Ambulatory Visit (INDEPENDENT_AMBULATORY_CARE_PROVIDER_SITE_OTHER): Payer: PPO

## 2022-08-13 VITALS — BP 170/91 | HR 86 | Resp 18 | Ht 66.0 in | Wt 179.0 lb

## 2022-08-13 VITALS — BP 132/68 | HR 86 | Ht 65.0 in | Wt 171.6 lb

## 2022-08-13 DIAGNOSIS — I480 Paroxysmal atrial fibrillation: Secondary | ICD-10-CM

## 2022-08-13 DIAGNOSIS — I10 Essential (primary) hypertension: Secondary | ICD-10-CM

## 2022-08-13 DIAGNOSIS — R413 Other amnesia: Secondary | ICD-10-CM | POA: Diagnosis not present

## 2022-08-13 DIAGNOSIS — E782 Mixed hyperlipidemia: Secondary | ICD-10-CM | POA: Diagnosis not present

## 2022-08-13 DIAGNOSIS — G20A1 Parkinson's disease without dyskinesia, without mention of fluctuations: Secondary | ICD-10-CM | POA: Diagnosis not present

## 2022-08-13 DIAGNOSIS — F0670 Mild neurocognitive disorder due to known physiological condition without behavioral disturbance: Secondary | ICD-10-CM | POA: Diagnosis not present

## 2022-08-13 LAB — VITAMIN B12: Vitamin B-12: 500 pg/mL (ref 211–911)

## 2022-08-13 MED ORDER — DIVALPROEX SODIUM 125 MG PO DR TAB: 125.0000 mg | DELAYED_RELEASE_TABLET | Freq: Every day | ORAL | 11 refills | Status: DC

## 2022-08-13 NOTE — Progress Notes (Signed)
08/13/2022 Walter Brown   May 20, 1941  [id number]  Primary Physician [name], MD Primary Cardiologist: Lorretta Harp MD Lupe Carney, [place]  HPI:  Walter Brown is a 81 y.o.  thin appearing married Caucasian male father of 2 children (1 is a Marine scientist and other is a Cabin crew), with no grandchildren, who was self-referred to be established in my practice in transfer from Dr. Geraldo Pitter.  His primary care provider is Dr. Randel Pigg.  I last saw him in the office 12/11/2021.  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  He stopped smoking back in 1982.  He has treated hypertension hyperlipidemia.  There is no family history for heart disease.  Never had a heart attack or stroke.  He walks several miles a day with his wife and occasionally notices increased heart rate on his apple watch.  Does have Parkinson's disease on Sinemet.  He is on amiodarone and Eliquis for his PA flutter.  His Parkinson disease clearly is his biggest issue that he complains of.  He did have an episode of what sounds like PAF a year ago but this is fairly rare phenomenon for him.  He denies chest pain or shortness of breath.  He did have a chest CT performed 09/24/2019 that showed heavy coronary calcification but a negative exercise Myoview stress test 04/23/2018.  Since I saw him 8 months ago he continues to do well.  He has not had an episode of PAF since I saw him.  He is somewhat unsteady of gait which he relates to his Parkinson's disease.  He also has weakness in his thigh muscles.  He denies chest pain or shortness of breath.   Current Meds  Medication Sig   amiodarone (PACERONE) 200 MG tablet Take 0.5 tablets (100 mg total) by mouth daily.   carbidopa-levodopa (SINEMET CR) 50-200 MG tablet TAKE 1 TABLET BY MOUTH EVERYDAY AT BEDTIME   carbidopa-levodopa (SINEMET IR) 25-100 MG tablet TAKE 1 TABLET BY MOUTH 3 (THREE) TIMES DAILY. 7AM/11AM/4PM   divalproex  (DEPAKOTE) 125 MG DR tablet Take 1 tablet (125 mg total) by mouth at bedtime.   ELIQUIS 5 MG TABS tablet TAKE 1 TABLET BY MOUTH TWICE A DAY   ezetimibe (ZETIA) 10 MG tablet Take 1 tablet (10 mg total) by mouth daily.   hydrochlorothiazide (HYDRODIURIL) 25 MG tablet TAKE 1 TABLET BY MOUTH EVERY DAY   Melatonin 10 MG TABS Take 10 mg by mouth daily as needed.   Multiple Vitamins-Minerals (ICAPS AREDS 2 PO) Take 1 capsule by mouth in the morning and at bedtime.   sertraline (ZOLOFT) 100 MG tablet Take 1 tablet (100 mg total) by mouth daily.   terazosin (HYTRIN) 10 MG capsule Take 10 mg by mouth at bedtime.      Allergies  Allergen Reactions   Albuterol Sulfate Palpitations   Azithromycin Other (See Comments)    Hepatotoxicity   Doxycycline Hyclate Other (See Comments)    Taken with Azithromycin and had Heaptotoxicity    Social History   Socioeconomic History   Marital status: Married    Spouse name: Not on file   Number of children: 2   Years of education: 16   Highest education level: Bachelor's degree (e.g., BA, AB, BS)  Occupational History   Occupation: retired  Tobacco Use   Smoking status: Former    Packs/day: 1.50    Years: 15.00    Total  pack years: 22.50    Types: Cigarettes    Start date: 10/07/1978   Smokeless tobacco: Never  Vaping Use   Vaping Use: Never used  Substance and Sexual Activity   Alcohol use: Yes    Alcohol/week: 7.0 - 14.0 standard drinks of alcohol    Types: 7 - 14 Glasses of wine per week    Comment: 1-2 glasses of wine with dinner   Drug use: No   Sexual activity: Yes    Comment: lives with wife, 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  Other Topics Concern   Not on file  Social History Narrative   Right handed    Occasionally caffeine   Retired   Scientist, physiological Strain: Low Risk  (03/26/2022)   Overall Financial Resource Strain (CARDIA)    Difficulty of  Paying Living Expenses: Not hard at all  Food Insecurity: No Food Insecurity (03/26/2022)   Hunger Vital Sign    Worried About Running Out of Food in the Last Year: Never true    Ran Out of Food in the Last Year: Never true  Transportation Needs: No Transportation Needs (03/26/2022)   PRAPARE - Hydrologist (Medical): No    Lack of Transportation (Non-Medical): No  Physical Activity: Sufficiently Active (03/26/2022)   Exercise Vital Sign    Days of Exercise per Week: 5 days    Minutes of Exercise per Session: 50 min  Stress: Stress Concern Present (03/26/2022)   [place]    Feeling of Stress : To some extent  Social Connections: Moderately Isolated (03/26/2022)   Social Connection and Isolation Panel [NHANES]    Frequency of Communication with Friends and Family: More than three times a week    Frequency of Social Gatherings with Friends and Family: Once a week    Attends Religious Services: Never    Marine scientist or Organizations: No    Attends Archivist Meetings: Never    Marital Status: Married  Human resources officer Violence: Not At Risk (03/26/2022)   Humiliation, Afraid, Rape, and Kick questionnaire    Fear of Current or Ex-Partner: No    Emotionally Abused: No    Physically Abused: No    Sexually Abused: No     Review of Systems: General: negative for chills, fever, night sweats or weight changes.  Cardiovascular: negative for chest pain, dyspnea on exertion, edema, orthopnea, palpitations, paroxysmal nocturnal dyspnea or shortness of breath Dermatological: negative for rash Respiratory: negative for cough or wheezing Urologic: negative for hematuria Abdominal: negative for nausea, vomiting, diarrhea, bright red blood per rectum, melena, or hematemesis Neurologic: negative for visual changes, syncope, or dizziness All other systems reviewed and are otherwise negative  except as noted above.    Blood pressure 132/68, pulse 86, height '5\' 5"'$  (1.651 m), weight 171 lb 9.6 oz (77.8 kg), SpO2 94 %.  General appearance: alert and no distress Neck: no adenopathy, no carotid bruit, no JVD, supple, symmetrical, trachea midline, and thyroid not enlarged, symmetric, no tenderness/mass/nodules Lungs: clear to auscultation bilaterally Heart: regular rate and rhythm, S1, S2 normal, no murmur, click, rub or gallop Extremities: extremities normal, atraumatic, no cyanosis or edema Pulses: 2+ and symmetric Skin: Skin color, texture, turgor normal. No rashes or lesions Neurologic: Grossly normal  EKG not performed today  ASSESSMENT AND PLAN:   Hyperlipidemia History of hyperlipidemia on Zetia  with lipid profile performed 05/26/2022 revealing total cholesterol 158, LDL 73 and HDL 71.  Hypertension History of essential hypertension a blood pressure measured today at 132/68.  He is on hydrochlorothiazide.  PAF (paroxysmal atrial fibrillation) (HCC) History of paroxysmal atrial flutter on low-dose amiodarone and Eliquis.  He is in sinus rhythm today.  He has not had an episode of PAF since I saw him last.     Lorretta Harp MD [facility], [facility] 08/13/2022 11:17 AM

## 2022-08-13 NOTE — Assessment & Plan Note (Signed)
History of hyperlipidemia on Zetia with lipid profile performed 05/26/2022 revealing total cholesterol 158, LDL 73 and HDL 71.

## 2022-08-13 NOTE — Assessment & Plan Note (Signed)
History of essential hypertension a blood pressure measured today at 132/68.  He is on hydrochlorothiazide.

## 2022-08-13 NOTE — Patient Instructions (Signed)
Medication Instructions:  Your physician recommends that you continue on your current medications as directed. Please refer to the Current Medication list given to you today.  *If you need a refill on your cardiac medications before your next appointment, please call your pharmacy*   Follow-Up: At [facility], you and your health needs are our priority.  As part of our continuing mission to provide you with exceptional heart care, we have created designated Provider Care Teams.  These Care Teams include your primary Cardiologist (physician) and Advanced Practice Providers (APPs -  Physician Assistants and Nurse Practitioners) who all work together to provide you with the care you need, when you need it.  We recommend signing up for the patient portal called "MyChart".  Sign up information is provided on this After Visit Summary.  MyChart is used to connect with patients for Virtual Visits (Telemedicine).  Patients are able to view lab/test results, encounter notes, upcoming appointments, etc.  Non-urgent messages can be sent to your provider as well.   To learn more about what you can do with MyChart, go to [email or web address].    Your next appointment:   12 month(s)  The format for your next appointment:   In Person  Provider:   Jonathan Berry, MD   

## 2022-08-13 NOTE — Patient Instructions (Signed)
It was a pleasure to see you today at our office.   Recommendations:  Neurocognitive evaluation at our office Check labs today Depakote 125 mg at night for mood  Follow up in 3 months  Continue the Parkinson's meds as prescribed by Dr. Carles Collet   Whom to call:  Memory  decline, memory medications: Call our office [phone]   For psychiatric meds, mood meds: Please have your primary care physician manage these medications.   Counseling regarding caregiver distress, including caregiver depression, anxiety and issues regarding community resources, adult day care programs, adult living facilities, or memory care questions:   Feel free to contact [place], Social Worker at [phone]  For guidance in geriatric dementia issues please call [place] [phone]    If you have any severe symptoms of a stroke, or other severe issues such as confusion,severe chills or fever, etc call 911 or go to the ER as you may need to be evaluated further     RECOMMENDATIONS FOR ALL PATIENTS WITH MEMORY PROBLEMS: 1. Continue to exercise (Recommend 30 minutes of walking everyday, or 3 hours every week) 2. Increase social interactions - continue going to [place] and enjoy social gatherings with friends and family 3. Eat healthy, avoid fried foods and eat more fruits and vegetables 4. Maintain adequate blood pressure, blood sugar, and blood cholesterol level. Reducing the risk of stroke and cardiovascular disease also helps promoting better memory. 5. Avoid stressful situations. Live a simple life and avoid aggravations. Organize your time and prepare for the next day in anticipation. 6. Sleep well, avoid any interruptions of sleep and avoid any distractions in the bedroom that may interfere with adequate sleep quality 7. Avoid sugar, avoid sweets as there is a strong link between excessive sugar intake, diabetes, and cognitive impairment We discussed the Mediterranean diet, which  has been shown to help patients reduce the risk of progressive memory disorders and reduces cardiovascular risk. This includes eating fish, eat fruits and green leafy vegetables, nuts like almonds and hazelnuts, walnuts, and also use olive oil. Avoid fast foods and fried foods as much as possible. Avoid sweets and sugar as sugar use has been linked to worsening of memory function.  There is always a concern of gradual progression of memory problems. If this is the case, then we may need to adjust level of care according to patient needs. Support, both to the patient and caregiver, should then be put into place.      You have been referred for a neuropsychological evaluation (i.e., evaluation of memory and thinking abilities). Please bring someone with you to this appointment if possible, as it is helpful for the doctor to hear from both you and another adult who knows you well. Please bring eyeglasses and hearing aids if you wear them.    The evaluation will take approximately 3 hours and has two parts:   The first part is a clinical interview with the neuropsychologist (Dr. Melvyn Novas or Dr. Nicole Kindred). During the interview, the neuropsychologist will speak with you and the individual you brought to the appointment.    The second part of the evaluation is testing with the doctor's technician Hinton Dyer or Maudie Mercury). During the testing, the technician will ask you to remember different types of material, solve problems, and answer some questionnaires. Your family member will not be present for this portion of the evaluation.   Please note: We must reserve several hours of the neuropsychologist's time and the psychometrician's time for your evaluation  appointment. As such, there is a No-Show fee of $100. If you are unable to attend any of your appointments, please contact our office as soon as possible to reschedule.    FALL PRECAUTIONS: Be cautious when walking. Scan the area for obstacles that may increase the risk  of trips and falls. When getting up in the mornings, sit up at the edge of the bed for a few minutes before getting out of bed. Consider elevating the bed at the head end to avoid drop of blood pressure when getting up. Walk always in a well-lit room (use night lights in the walls). Avoid area rugs or power cords from appliances in the middle of the walkways. Use a walker or a cane if necessary and consider physical therapy for balance exercise. Get your eyesight checked regularly.  FINANCIAL OVERSIGHT: Supervision, especially oversight when making financial decisions or transactions is also recommended.  HOME SAFETY: Consider the safety of the kitchen when operating appliances like stoves, microwave oven, and blender. Consider having supervision and share cooking responsibilities until no longer able to participate in those. Accidents with firearms and other hazards in the house should be identified and addressed as well.   ABILITY TO BE LEFT ALONE: If patient is unable to contact 911 operator, consider using LifeLine, or when the need is there, arrange for someone to stay with patients. Smoking is a fire hazard, consider supervision or cessation. Risk of wandering should be assessed by caregiver and if detected at any point, supervision and safe proof recommendations should be instituted.  MEDICATION SUPERVISION: Inability to self-administer medication needs to be constantly addressed. Implement a mechanism to ensure safe administration of the medications.   DRIVING: Regarding driving, in patients with progressive memory problems, driving will be impaired. We advise to have someone else do the driving if trouble finding directions or if minor accidents are reported. Independent driving assessment is available to determine safety of driving.   If you are interested in the driving assessment, you can contact the following:  [employer] in [place]  [place] [phone] or [phone]    [place] refers to food and lifestyle choices that are based on the traditions of countries located on the [employer]. This way of eating has been shown to help prevent certain conditions and improve outcomes for people who have chronic diseases, like kidney disease and heart disease. What are tips for following this plan? Lifestyle  Cook and eat meals together with your family, when possible. Drink enough fluid to keep your urine clear or pale yellow. Be physically active every day. This includes: Aerobic exercise like running or swimming. Leisure activities like gardening, walking, or housework. Get 7-8 hours of sleep each night. If recommended by your health care provider, drink red wine in moderation. This means 1 glass a day for nonpregnant women and 2 glasses a day for men. A glass of wine equals 5 oz (150 mL). Reading food labels  Check the serving size of packaged foods. For foods such as rice and pasta, the serving size refers to the amount of cooked product, not dry. Check the total fat in packaged foods. Avoid foods that have saturated fat or trans fats. Check the ingredients list for added sugars, such as corn syrup. Shopping  At the grocery store, buy most of your food from the areas near the walls of the store. This includes: Fresh  fruits and vegetables (produce). Grains, beans, nuts, and seeds. Some of these may be available in unpackaged forms or large amounts (in bulk). Fresh seafood. Poultry and eggs. Low-fat dairy products. Buy whole ingredients instead of prepackaged foods. Buy fresh fruits and vegetables in-season from local farmers markets. Buy frozen fruits and vegetables in resealable bags. If you do not have access to quality fresh seafood, buy precooked frozen shrimp or canned fish, such as tuna,  salmon, or sardines. Buy small amounts of raw or cooked vegetables, salads, or olives from the deli or salad bar at your store. Stock your pantry so you always have certain foods on hand, such as olive oil, canned tuna, canned tomatoes, rice, pasta, and beans. Cooking  Cook foods with extra-virgin olive oil instead of using butter or other vegetable oils. Have meat as a side dish, and have vegetables or grains as your main dish. This means having meat in small portions or adding small amounts of meat to foods like pasta or stew. Use beans or vegetables instead of meat in common dishes like chili or lasagna. Experiment with different cooking methods. Try roasting or broiling vegetables instead of steaming or sauteing them. Add frozen vegetables to soups, stews, pasta, or rice. Add nuts or seeds for added healthy fat at each meal. You can add these to yogurt, salads, or vegetable dishes. Marinate fish or vegetables using olive oil, lemon juice, garlic, and fresh herbs. Meal planning  Plan to eat 1 vegetarian meal one day each week. Try to work up to 2 vegetarian meals, if possible. Eat seafood 2 or more times a week. Have healthy snacks readily available, such as: Vegetable sticks with hummus. Greek yogurt. Fruit and nut trail mix. Eat balanced meals throughout the week. This includes: Fruit: 2-3 servings a day Vegetables: 4-5 servings a day Low-fat dairy: 2 servings a day Fish, poultry, or lean meat: 1 serving a day Beans and legumes: 2 or more servings a week Nuts and seeds: 1-2 servings a day Whole grains: 6-8 servings a day Extra-virgin olive oil: 3-4 servings a day Limit red meat and sweets to only a few servings a month What are my food choices? Mediterranean diet Recommended Grains: Whole-grain pasta. Brown rice. Bulgar wheat. Polenta. Couscous. Whole-wheat bread. [place]. Vegetables: Artichokes. Beets. Broccoli. Cabbage. Carrots. Eggplant. Green beans. Chard. Kale.  Spinach. Onions. Leeks. Peas. Squash. Tomatoes. Peppers. Radishes. Fruits: Apples. Apricots. Avocado. Berries. Bananas. Cherries. Dates. Figs. Grapes. Lemons. Melon. Oranges. Peaches. Plums. Pomegranate. Meats and other protein foods: Beans. Almonds. Sunflower seeds. Pine nuts. Peanuts. [place]. Salmon. Scallops. Shrimp. [place]. Tilapia. Clams. Oysters. Eggs. Dairy: Low-fat milk. Cheese. Greek yogurt. Beverages: Water. Red wine. Herbal tea. Fats and oils: Extra virgin olive oil. Avocado oil. Grape seed oil. Sweets and desserts: [place] yogurt with honey. Baked apples. Poached pears. Trail mix. Seasoning and other foods: Basil. Cilantro. Coriander. Cumin. Mint. Parsley. Sage. Rosemary. Tarragon. Garlic. Oregano. Thyme. Pepper. Balsalmic vinegar. Tahini. Hummus. Tomato sauce. Olives. Mushrooms. Limit these Grains: Prepackaged pasta or rice dishes. Prepackaged cereal with added sugar. Vegetables: Deep fried potatoes (french fries). Fruits: Fruit canned in syrup. Meats and other protein foods: Beef. Pork. Lamb. Poultry with skin. Hot dogs. [place] Salines. Dairy: Ice cream. Sour cream. Whole milk. Beverages: Juice. Sugar-sweetened soft drinks. Beer. Liquor and spirits. Fats and oils: Butter. Canola oil. Vegetable oil. Beef fat (tallow). Lard. Sweets and desserts: Cookies. Cakes. Pies. Candy. Seasoning and other foods: Mayonnaise. Premade sauces and marinades. The items listed may not be a complete list.  Talk with your dietitian about what dietary choices are right for you. Summary The Mediterranean diet includes both food and lifestyle choices. Eat a variety of fresh fruits and vegetables, beans, nuts, seeds, and whole grains. Limit the amount of red meat and sweets that you eat. Talk with your health care provider about whether it is safe for you to drink red wine in moderation. This means 1 glass a day for nonpregnant women and 2 glasses a day for men. A glass of wine equals 5 oz (150 mL). This information is  not intended to replace advice given to you by your health care provider. Make sure you discuss any questions you have with your health care provider. Document Released: 05/16/2016 Document Revised: 06/18/2016 Document Reviewed: 05/16/2016 Elsevier Interactive Patient Education  2017 [employer].

## 2022-08-13 NOTE — Progress Notes (Signed)
B12 is normal, thanks
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
## 2022-08-13 NOTE — Assessment & Plan Note (Signed)
History of paroxysmal atrial flutter on low-dose amiodarone and Eliquis.  He is in sinus rhythm today.  He has not had an episode of PAF since I saw him last.

## 2022-09-01 ENCOUNTER — Other Ambulatory Visit: Payer: Self-pay | Admitting: Neurology

## 2022-09-01 DIAGNOSIS — G20A1 Parkinson's disease without dyskinesia, without mention of fluctuations: Secondary | ICD-10-CM

## 2022-09-03 ENCOUNTER — Telehealth: Payer: Self-pay | Admitting: Physician Assistant

## 2022-09-03 NOTE — Telephone Encounter (Signed)
Pt called in stating he has an appointment on 09/11/22 with a Dr. Mallie Darting. He wrote Sara's name by the appointment and he thinks she referred him to this doctor. He seemed confused about why he was going.

## 2022-09-05 DIAGNOSIS — N401 Enlarged prostate with lower urinary tract symptoms: Secondary | ICD-10-CM | POA: Diagnosis not present

## 2022-09-05 DIAGNOSIS — R3915 Urgency of urination: Secondary | ICD-10-CM | POA: Diagnosis not present

## 2022-09-11 DIAGNOSIS — H43813 Vitreous degeneration, bilateral: Secondary | ICD-10-CM | POA: Diagnosis not present

## 2022-09-11 DIAGNOSIS — H353112 Nonexudative age-related macular degeneration, right eye, intermediate dry stage: Secondary | ICD-10-CM | POA: Diagnosis not present

## 2022-09-11 DIAGNOSIS — H35372 Puckering of macula, left eye: Secondary | ICD-10-CM | POA: Diagnosis not present

## 2022-09-11 DIAGNOSIS — H353221 Exudative age-related macular degeneration, left eye, with active choroidal neovascularization: Secondary | ICD-10-CM | POA: Diagnosis not present

## 2022-09-13 DIAGNOSIS — H6121 Impacted cerumen, right ear: Secondary | ICD-10-CM | POA: Diagnosis not present

## 2022-09-24 ENCOUNTER — Ambulatory Visit (HOSPITAL_BASED_OUTPATIENT_CLINIC_OR_DEPARTMENT_OTHER)
Admission: RE | Admit: 2022-09-24 | Discharge: 2022-09-24 | Disposition: A | Discharge status: Home / Self Care | Payer: PPO | Source: Ambulatory Visit | Attending: Family Medicine | Admitting: Family Medicine

## 2022-09-24 ENCOUNTER — Encounter: Payer: Self-pay | Admitting: Family Medicine

## 2022-09-24 ENCOUNTER — Ambulatory Visit (INDEPENDENT_AMBULATORY_CARE_PROVIDER_SITE_OTHER): Payer: PPO | Admitting: Family Medicine

## 2022-09-24 VITALS — BP 142/74 | HR 84 | Temp 97.9°F | Resp 18 | Ht 65.0 in | Wt 168.8 lb

## 2022-09-24 DIAGNOSIS — J019 Acute sinusitis, unspecified: Secondary | ICD-10-CM | POA: Insufficient documentation

## 2022-09-24 DIAGNOSIS — R059 Cough, unspecified: Secondary | ICD-10-CM | POA: Diagnosis not present

## 2022-09-24 DIAGNOSIS — R051 Acute cough: Secondary | ICD-10-CM | POA: Diagnosis not present

## 2022-09-24 MED ORDER — BENZONATATE 200 MG PO CAPS: 200.0000 mg | ORAL_CAPSULE | Freq: Two times a day (BID) | ORAL | 0 refills | Status: DC | PRN

## 2022-09-24 MED ORDER — AMOXICILLIN-POT CLAVULANATE 875-125 MG PO TABS: 1.0000 | ORAL_TABLET | Freq: Two times a day (BID) | ORAL | 0 refills | Status: DC

## 2022-09-24 NOTE — Patient Instructions (Signed)
Repeat chest xray based on symptoms and previously abnormal imaging that was not repeated as recommended Adding Augmentin antibiotics and Tessalon (cough pills) Continue supportive measures including rest, hydration, humidifier use, steam showers, warm compresses to sinuses, warm liquids with lemon and honey, and over-the-counter cough, cold, and analgesics as needed.   Please contact office for follow-up if symptoms do not improve or worsen. Seek emergency care if symptoms become severe.

## 2022-09-24 NOTE — Progress Notes (Signed)
Acute Office Visit  Subjective:     Patient ID: Walter Brown, male    DOB: 1941/05/06, 81 y.o.   MRN: [id number]  Chief Complaint  Patient presents with   Chest Cough // No Covid Test    Dry cough x 1 month. No tx tried.     HPI Patient is in today for cough.  Cough: Patient complains of rhinorrhea, postnasal drip, cough (dry), a few times with white sputum.  Symptoms began 2 weeks ago.  The cough is non-productive, with wheezing, waxing and waning over time and is aggravated by nothing Associated symptoms include:wheezing. Patient does have a history of asthma.  Patient does not have recent travel. Patient does have a history of smoking. He denies fever, chills, body aches headaches, unusual fatigue, loss of appetite, loss of taste/smell, chest pain, dyspnea. He has had COVID, Flu, and RSV vaccines.      ROS All review of systems negative except what is listed in the HPI      Objective:    BP (!) 142/74 (BP Location: Left Arm, Patient Position: Sitting, Cuff Size: Normal)   Pulse 84   Temp 97.9 F (36.6 C) (Oral)   Resp 18   Ht '5\' 5"'$  (1.651 m)   Wt 168 lb 12.8 oz (76.6 kg)   BMI 28.09 kg/m    Physical Exam Vitals reviewed.  Constitutional:      Appearance: Normal appearance.  HENT:     Nose: Nose normal.     Mouth/Throat:     Mouth: Mucous membranes are moist.     Pharynx: Oropharynx is clear.     Comments: Postnasal drip Cardiovascular:     Rate and Rhythm: Normal rate and regular rhythm.     Pulses: Normal pulses.     Heart sounds: Normal heart sounds.  Pulmonary:     Effort: Pulmonary effort is normal.     Breath sounds: Normal breath sounds. No wheezing, rhonchi or rales.  Musculoskeletal:     Cervical back: Normal range of motion and neck supple. No tenderness.  Lymphadenopathy:     Cervical: No cervical adenopathy.  Skin:    General: Skin is warm and dry.  Neurological:     Mental Status: He is alert and oriented to person, place, and time.   Psychiatric:        Mood and Affect: Mood normal.        Behavior: Behavior normal.        Thought Content: Thought content normal.        Judgment: Judgment normal.     No results found for any visits on 09/24/22.      Assessment & Plan:   Problem List Items Addressed This Visit   None Visit Diagnoses     Acute non-recurrent sinusitis, unspecified location    -  Primary   Relevant Medications   amoxicillin-clavulanate (AUGMENTIN) 875-125 MG tablet   benzonatate (TESSALON) 200 MG capsule   Other Relevant Orders   DG Chest 2 View   Acute cough       Relevant Medications   amoxicillin-clavulanate (AUGMENTIN) 875-125 MG tablet   benzonatate (TESSALON) 200 MG capsule   Other Relevant Orders   DG Chest 2 View     Repeat chest xray based on symptoms and previously abnormal imaging that was not repeated as recommended Adding Augmentin antibiotics and Tessalon (cough pills) Continue supportive measures including rest, hydration, humidifier use, steam showers, warm compresses to sinuses, warm liquids with  lemon and honey, and over-the-counter cough, cold, and analgesics as needed.   Please contact office for follow-up if symptoms do not improve or worsen. Seek emergency care if symptoms become severe.  Meds ordered this encounter  Medications   amoxicillin-clavulanate (AUGMENTIN) 875-125 MG tablet    Sig: Take 1 tablet by mouth 2 (two) times daily.    Dispense:  20 tablet    Refill:  0    Order Specific Question:   Supervising Provider    Answer:   Penni Homans A [[id number]]   benzonatate (TESSALON) 200 MG capsule    Sig: Take 1 capsule (200 mg total) by mouth 2 (two) times daily as needed for cough.    Dispense:  20 capsule    Refill:  0    Order Specific Question:   Supervising Provider    Answer:   Penni Homans A [[id number]]    Return if symptoms worsen or fail to improve.  Terrilyn Saver, NP

## 2022-09-29 ENCOUNTER — Encounter (HOSPITAL_BASED_OUTPATIENT_CLINIC_OR_DEPARTMENT_OTHER): Payer: Self-pay | Admitting: Emergency Medicine

## 2022-09-29 ENCOUNTER — Emergency Department (HOSPITAL_BASED_OUTPATIENT_CLINIC_OR_DEPARTMENT_OTHER): Payer: PPO

## 2022-09-29 ENCOUNTER — Emergency Department (HOSPITAL_BASED_OUTPATIENT_CLINIC_OR_DEPARTMENT_OTHER)
Admission: EM | Admit: 2022-09-29 | Discharge: 2022-09-29 | Disposition: A | Discharge status: Home / Self Care | Payer: PPO | Attending: Emergency Medicine | Admitting: Emergency Medicine

## 2022-09-29 ENCOUNTER — Other Ambulatory Visit: Payer: Self-pay

## 2022-09-29 DIAGNOSIS — Z1152 Encounter for screening for COVID-19: Secondary | ICD-10-CM | POA: Diagnosis not present

## 2022-09-29 DIAGNOSIS — R058 Other specified cough: Secondary | ICD-10-CM | POA: Insufficient documentation

## 2022-09-29 DIAGNOSIS — Z7901 Long term (current) use of anticoagulants: Secondary | ICD-10-CM | POA: Insufficient documentation

## 2022-09-29 DIAGNOSIS — J45909 Unspecified asthma, uncomplicated: Secondary | ICD-10-CM | POA: Insufficient documentation

## 2022-09-29 DIAGNOSIS — R140 Abdominal distension (gaseous): Secondary | ICD-10-CM | POA: Diagnosis not present

## 2022-09-29 DIAGNOSIS — R059 Cough, unspecified: Secondary | ICD-10-CM | POA: Diagnosis not present

## 2022-09-29 DIAGNOSIS — Z20822 Contact with and (suspected) exposure to covid-19: Secondary | ICD-10-CM | POA: Diagnosis not present

## 2022-09-29 DIAGNOSIS — I10 Essential (primary) hypertension: Secondary | ICD-10-CM | POA: Diagnosis not present

## 2022-09-29 DIAGNOSIS — Z87891 Personal history of nicotine dependence: Secondary | ICD-10-CM | POA: Diagnosis not present

## 2022-09-29 LAB — RESP PANEL BY RT-PCR (RSV, FLU A&B, COVID)  RVPGX2
Influenza A by PCR: NEGATIVE
Influenza B by PCR: NEGATIVE
Resp Syncytial Virus by PCR: NEGATIVE
SARS Coronavirus 2 by RT PCR: NEGATIVE

## 2022-09-29 NOTE — ED Triage Notes (Addendum)
Patient was going to attend an event tomorrow and the requirement was to have a COVID test done. Pt did test at home that resulted as positive. Pt denies any symptoms.

## 2022-09-29 NOTE — Discharge Instructions (Signed)
You tested negative today for COVID, influenza as well as RSV.  Chest x-ray showed no abnormalities.  Please do not hesitate to return to emergency department for worrisome signs and symptoms we discussed become apparent.

## 2022-09-29 NOTE — ED Provider Notes (Signed)
[facility] EMERGENCY DEPARTMENT Provider Note   CSN: [id number] Arrival date & time: 09/29/22  1604     History  Chief Complaint  Patient presents with   COVID +    Walter Brown is a 81 y.o. male.  HPI   81 year old male presents emergency department with complaints of positive at-home COVID test.  Patient requesting repeat COVID test.  Patient currently without symptoms.  Denies fever, chills, night sweats, congestion, sore throat, abdominal pain, urinary symptoms, change in bowel habits.  Patient tested for at home COVID because family meeting tomorrow required negative COVID test per patient.  Past Medical History:  Diagnosis Date   Allergy    Anemia    mild   Anxiety    Arthritis 04/06/2017   Asthma    childhood   ASVD (arteriosclerotic vascular disease) 09/07/2018   Basal cell carcinoma    skin- on nose- basal cell (20 yrs ago) forehead 1 year ago   BPH (benign prostatic hyperplasia)    Bradycardia 06/28/2018   Cerumen impaction 08/10/2012   Chicken pox as child   Coronary artery calcification 11/23/2019   Elevated LFTs    [place] measles as a child   Hyperlipidemia    Hypertension    Internal hemorrhoids    Irregular cardiac rhythm 03/10/2018   Macular degeneration    Major depressive disorder 08/01/2012   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 Authorizing Provider  amiodarone (PACERONE) 200 MG tablet Take 0.5 tablets (100 mg total) by mouth daily. 05/13/22   Loel Dubonnet, NP  amoxicillin-clavulanate (AUGMENTIN) 875-125 MG tablet Take 1 tablet by  mouth 2 (two) times daily. 09/24/22   Terrilyn Saver, NP  benzonatate (TESSALON) 200 MG capsule Take 1 capsule (200 mg total) by mouth 2 (two) times daily as needed for cough. 09/24/22   Terrilyn Saver, NP  carbidopa-levodopa (SINEMET CR) 50-200 MG tablet TAKE 1 TABLET BY MOUTH EVERYDAY AT BEDTIME 09/02/22   Tat, Rebecca S, DO  carbidopa-levodopa (SINEMET IR) 25-100 MG tablet TAKE 1 TABLET BY MOUTH 3 (THREE) TIMES DAILY. 7AM/11AM/4PM 04/30/22   Tat, Eustace Quail, DO  divalproex (DEPAKOTE) 125 MG DR tablet Take 1 tablet (125 mg total) by mouth at bedtime. 08/13/22   Rondel Jumbo, PA-C  ELIQUIS 5 MG TABS tablet TAKE 1 TABLET BY MOUTH TWICE A DAY 05/13/22   Lorretta Harp, MD  ezetimibe (ZETIA) 10 MG tablet Take 1 tablet (10 mg total) by mouth daily. 05/13/22 05/08/23  Loel Dubonnet, NP  hydrochlorothiazide (HYDRODIURIL) 25 MG tablet TAKE 1 TABLET BY MOUTH EVERY DAY 08/09/22   [name], MD  Melatonin 10 MG TABS Take 10 mg by mouth daily as needed.    [provider]  Multiple Vitamins-Minerals (ICAPS AREDS 2 PO) Take 1 capsule by mouth in the morning and at bedtime.    [provider]  sertraline (ZOLOFT) 100 MG tablet Take 1 tablet (100 mg total) by mouth daily. 06/11/22   [name], MD  terazosin (HYTRIN) 10 MG capsule Take 10 mg by mouth at bedtime.  [provider]      Allergies    Albuterol sulfate, Azithromycin, and Doxycycline hyclate    Review of Systems   Review of Systems  All other systems reviewed and are negative.   Physical Exam Updated Vital Signs BP (!) 146/91 (BP Location: Left Arm)   Pulse 66   Temp 97.9 F (36.6 C) (Oral)   Resp 18   Ht '5\' 5"'$  (1.651 m)   Wt 76.6 kg   SpO2 97%   BMI 28.09 kg/m  Physical Exam Vitals and nursing note reviewed.  Constitutional:      General: He is not in acute distress.    Appearance: He is well-developed.  HENT:     Head: Normocephalic and atraumatic.  Eyes:     Conjunctiva/sclera:  Conjunctivae normal.  Cardiovascular:     Rate and Rhythm: Normal rate and regular rhythm.  Pulmonary:     Effort: Pulmonary effort is normal. No respiratory distress.     Breath sounds: Normal breath sounds.  Abdominal:     Palpations: Abdomen is soft.     Tenderness: There is no abdominal tenderness.  Musculoskeletal:        General: No swelling.     Cervical back: Neck supple.  Skin:    General: Skin is warm and dry.     Capillary Refill: Capillary refill takes less than 2 seconds.  Neurological:     Mental Status: He is alert.  Psychiatric:        Mood and Affect: Mood normal.     ED Results / Procedures / Treatments   Labs (all labs ordered are listed, but only abnormal results are displayed) Labs Reviewed  RESP PANEL BY RT-PCR (RSV, FLU A&B, COVID)  [id number]    EKG None  Radiology DG Chest Portable 1 View  Result Date: 09/29/2022 CLINICAL DATA:  cough EXAM: PORTABLE CHEST 1 VIEW COMPARISON:  September 24, 2022 FINDINGS: The cardiomediastinal silhouette is unchanged in contour. No pleural effusion. No pneumothorax. No acute pleuroparenchymal abnormality. Diffuse gaseous distension of bowel beneath the diaphragms, similar in comparison to prior. IMPRESSION: 1. No acute cardiopulmonary abnormality. Electronically Signed   By: Valentino Saxon M.D.   On: 09/29/2022 17:13    Procedures Procedures    Medications Ordered in ED Medications - No data to display  ED Course/ Medical Decision Making/ A&P                           Medical Decision Making Amount and/or Complexity of Data Reviewed Radiology: ordered.   This patient presents to the ED for concern of positive at-home COVID test, this involves an extensive number of treatment options, and is a complaint that carries with it a high risk of complications and morbidity.  The differential diagnosis includes COVID   Co morbidities that complicate the patient evaluation  See HPI   Additional history  obtained:  Additional history obtained from EMR External records from outside source obtained and reviewed including hospital records   Lab Tests:  I Ordered, and personally interpreted labs.  The pertinent results include: Negative respiratory viral panel   Imaging Studies ordered:  I ordered imaging studies including chest x-ray I independently visualized and interpreted imaging which showed streaky atelectasis or scarring at left lung base.  No acute cardiopulmonary findings otherwise. I agree with the radiologist interpretation  Cardiac Monitoring: / EKG:  The patient was maintained on a cardiac monitor.  I personally viewed  and interpreted the cardiac monitored which showed an underlying rhythm of: Sinus rhythm   Consultations Obtained:  N/a   Problem List / ED Course / Critical interventions / Medication management  COVID testing Reevaluation of the patient showed that the patient stayed the same I have reviewed the patients home medicines and have made adjustments as needed   Social Determinants of Health:  Former cigarette use.  Denies illicit drug use.   Test / Admission - Considered:  COVID testing Vitals signs significant for mild hypertension with blood pressure 155/89.  Recommend follow-up with primary care regarding elevation blood pressure.. Otherwise within normal range and stable throughout visit. Laboratory/imaging studies significant for: See above Patient asymptomatic requesting COVID testing given at home positive.  Patient tested negative here in the emergency department.  Further workup/treatment deemed necessary at this time given negative COVID testing and patient being asymptomatic in nature. Worrisome signs and symptoms were discussed with the patient, and the patient acknowledged understanding to return to the ED if noticed. Patient was stable upon discharge.         Final Clinical Impression(s) / ED Diagnoses Final diagnoses:   Encounter for screening laboratory testing for COVID-19 virus    Rx / DC Orders ED Discharge Orders     None         Wilnette Kales, [place] 09/29/22 [place], [place], DO 09/29/22 2152

## 2022-10-04 ENCOUNTER — Telehealth: Payer: Self-pay | Admitting: Family Medicine

## 2022-10-04 DIAGNOSIS — G20A1 Parkinson's disease without dyskinesia, without mention of fluctuations: Secondary | ICD-10-CM

## 2022-10-04 NOTE — Telephone Encounter (Signed)
Patient dropped off page with some info about referrals & where they need to be sent to  Placed in blyth bin  Just incase:   [facility] Fax# [phone]  Dept[facility] [phone]

## 2022-10-08 NOTE — Telephone Encounter (Signed)
Referral was sent.

## 2022-10-09 ENCOUNTER — Telehealth: Payer: Self-pay | Admitting: Family Medicine

## 2022-10-09 NOTE — Telephone Encounter (Signed)
[facility] called stating that pt's insurance is OON and they are unable to see him. Please Advise.

## 2022-10-10 ENCOUNTER — Other Ambulatory Visit: Payer: Self-pay

## 2022-10-10 DIAGNOSIS — G20A1 Parkinson's disease without dyskinesia, without mention of fluctuations: Secondary | ICD-10-CM

## 2022-10-10 NOTE — Telephone Encounter (Signed)
Patient states he needs his referral to be sent to [facility] tel. [phone].Please advise.

## 2022-10-10 NOTE — Telephone Encounter (Signed)
Sent already spoke with pt

## 2022-10-14 DIAGNOSIS — N401 Enlarged prostate with lower urinary tract symptoms: Secondary | ICD-10-CM | POA: Diagnosis not present

## 2022-10-14 DIAGNOSIS — Z125 Encounter for screening for malignant neoplasm of prostate: Secondary | ICD-10-CM | POA: Diagnosis not present

## 2022-10-14 DIAGNOSIS — R3912 Poor urinary stream: Secondary | ICD-10-CM | POA: Diagnosis not present

## 2022-10-14 DIAGNOSIS — R3915 Urgency of urination: Secondary | ICD-10-CM | POA: Diagnosis not present

## 2022-10-16 ENCOUNTER — Telehealth: Payer: Self-pay

## 2022-10-16 NOTE — Telephone Encounter (Signed)
Told them to take patient to [facility] get all weapons out. If they can't get him there they need to call 911

## 2022-10-16 NOTE — Telephone Encounter (Signed)
Called patients son and gave him the the phone number address Phone:  [phone]  Address:  [place], [place] [address] To please call

## 2022-10-16 NOTE — Telephone Encounter (Signed)
Patients wife came in and stated that she has a Lie [employer] scheduled for Saturday and Marriage counseling scheduled. Her husband continues to think she is cheating and wants to discuss "who is going to pay for what" when he divorces her. Patient stated she has scheduled the lie detector test for her own conscious and if they go to court she will have those results. I informed patient and provided her with information to [facility] address and phone number. I told her that since her husband is gone right now to call them and get all the information she may need and a plan in place so that when he returns to [facility] she has a plan for her safety. Also informed patients wife to call 911 ASAP if patient is putting her, himself or anyone else in harms way. Patients wife stated she hid the rifles from her husband and her son removed the others from the home. Patients wife is going to call the [facility] to ask them questions. I tired explaining to wife that unfortunately her husbands mental state needs to be evaluated ASAP. Patients wife thanked [place] for the information.

## 2022-10-16 NOTE — Telephone Encounter (Signed)
Nicki Reaper is calling in, he is concerned about Steve's worsening aggression and neurocognitive changes. States that his mom went out for a pottery class and he found out that the teacher was a male so Nezar started accusing her of cheating. He called the [employer] several times threatening this teacher when the patient did not get a response Mohd went out and bought a gun and told his other son who lives in [place] that he was going to [employer] this [employer]. Briley's son who is in [place] came by the house and took all the guns that he purchased away but Oswald is still having this delusion that his wife cheated on him and is threatening to divorce her.

## 2022-10-21 ENCOUNTER — Other Ambulatory Visit (HOSPITAL_BASED_OUTPATIENT_CLINIC_OR_DEPARTMENT_OTHER): Payer: Self-pay | Admitting: Family

## 2022-10-21 ENCOUNTER — Other Ambulatory Visit: Payer: Self-pay | Admitting: Neurology

## 2022-10-21 ENCOUNTER — Other Ambulatory Visit: Payer: Self-pay | Admitting: Internal Medicine

## 2022-10-21 DIAGNOSIS — G20A1 Parkinson's disease without dyskinesia, without mention of fluctuations: Secondary | ICD-10-CM

## 2022-10-21 NOTE — Telephone Encounter (Signed)
Patient of Dr. Berry. Please review for refill. Thank you!  

## 2022-10-24 ENCOUNTER — Telehealth: Payer: PPO | Admitting: Physician Assistant
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
## 2022-10-25 ENCOUNTER — Telehealth: Payer: Self-pay | Admitting: Physician Assistant

## 2022-10-25 NOTE — Telephone Encounter (Signed)
Pt's wife called in stating she would like for the pt to be evaluated again whenever he comes back in to see Clarise Cruz.

## 2022-10-28 ENCOUNTER — Ambulatory Visit: Payer: PPO | Admitting: Physician Assistant

## 2022-10-31 ENCOUNTER — Encounter: Payer: Self-pay | Admitting: Physician Assistant

## 2022-10-31 ENCOUNTER — Ambulatory Visit (INDEPENDENT_AMBULATORY_CARE_PROVIDER_SITE_OTHER): Payer: PPO | Admitting: Physician Assistant

## 2022-10-31 VITALS — BP 133/71 | HR 64 | Resp 18 | Ht 65.0 in | Wt 170.0 lb

## 2022-10-31 DIAGNOSIS — G20A1 Parkinson's disease without dyskinesia, without mention of fluctuations: Secondary | ICD-10-CM | POA: Diagnosis not present

## 2022-10-31 DIAGNOSIS — F0670 Mild neurocognitive disorder due to known physiological condition without behavioral disturbance: Secondary | ICD-10-CM
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
## 2022-10-31 NOTE — Patient Instructions (Addendum)
It was a pleasure to see you today at our office.   Recommendations:  Neurocognitive evaluation at our office Consider increasing Depakote 250 mg at night for mood andsundowning Follow up in 6 months  Continue the Parkinson's meds as prescribed by Dr. Carles Collet   Whom to call:  Memory  decline, memory medications: Call our office [phone]   For psychiatric meds, mood meds: Please have your primary care physician manage these medications.   Counseling regarding caregiver distress, including caregiver depression, anxiety and issues regarding community resources, adult day care programs, adult living facilities, or memory care questions:   Feel free to contact Wilder, Social Worker at [phone]  For guidance in geriatric dementia issues please call [place] [phone]    If you have any severe symptoms of a stroke, or other severe issues such as confusion,severe chills or fever, etc call 911 or go to the ER as you may need to be evaluated further     RECOMMENDATIONS FOR ALL PATIENTS WITH MEMORY PROBLEMS: 1. Continue to exercise (Recommend 30 minutes of walking everyday, or 3 hours every week) 2. Increase social interactions - continue going to [place] and enjoy social gatherings with friends and family 3. Eat healthy, avoid fried foods and eat more fruits and vegetables 4. Maintain adequate blood pressure, blood sugar, and blood cholesterol level. Reducing the risk of stroke and cardiovascular disease also helps promoting better memory. 5. Avoid stressful situations. Live a simple life and avoid aggravations. Organize your time and prepare for the next day in anticipation. 6. Sleep well, avoid any interruptions of sleep and avoid any distractions in the bedroom that may interfere with adequate sleep quality 7. Avoid sugar, avoid sweets as there is a strong link between excessive sugar intake, diabetes, and cognitive impairment We discussed the  Mediterranean diet, which has been shown to help patients reduce the risk of progressive memory disorders and reduces cardiovascular risk. This includes eating fish, eat fruits and green leafy vegetables, nuts like almonds and hazelnuts, walnuts, and also use olive oil. Avoid fast foods and fried foods as much as possible. Avoid sweets and sugar as sugar use has been linked to worsening of memory function.  There is always a concern of gradual progression of memory problems. If this is the case, then we may need to adjust level of care according to patient needs. Support, both to the patient and caregiver, should then be put into place.      You have been referred for a neuropsychological evaluation (i.e., evaluation of memory and thinking abilities). Please bring someone with you to this appointment if possible, as it is helpful for the doctor to hear from both you and another adult who knows you well. Please bring eyeglasses and hearing aids if you wear them.    The evaluation will take approximately 3 hours and has two parts:   The first part is a clinical interview with the neuropsychologist (Dr. Melvyn Novas or Dr. Nicole Kindred). During the interview, the neuropsychologist will speak with you and the individual you brought to the appointment.    The second part of the evaluation is testing with the doctor's technician Hinton Dyer or Maudie Mercury). During the testing, the technician will ask you to remember different types of material, solve problems, and answer some questionnaires. Your family member will not be present for this portion of the evaluation.   Please note: We must reserve several hours of the neuropsychologist's time and the psychometrician's time for your evaluation appointment.  As such, there is a No-Show fee of $100. If you are unable to attend any of your appointments, please contact our office as soon as possible to reschedule.    FALL PRECAUTIONS: Be cautious when walking. Scan the area for obstacles  that may increase the risk of trips and falls. When getting up in the mornings, sit up at the edge of the bed for a few minutes before getting out of bed. Consider elevating the bed at the head end to avoid drop of blood pressure when getting up. Walk always in a well-lit room (use night lights in the walls). Avoid area rugs or power cords from appliances in the middle of the walkways. Use a walker or a cane if necessary and consider physical therapy for balance exercise. Get your eyesight checked regularly.  FINANCIAL OVERSIGHT: Supervision, especially oversight when making financial decisions or transactions is also recommended.  HOME SAFETY: Consider the safety of the kitchen when operating appliances like stoves, microwave oven, and blender. Consider having supervision and share cooking responsibilities until no longer able to participate in those. Accidents with firearms and other hazards in the house should be identified and addressed as well.   ABILITY TO BE LEFT ALONE: If patient is unable to contact 911 operator, consider using LifeLine, or when the need is there, arrange for someone to stay with patients. Smoking is a fire hazard, consider supervision or cessation. Risk of wandering should be assessed by caregiver and if detected at any point, supervision and safe proof recommendations should be instituted.  MEDICATION SUPERVISION: Inability to self-administer medication needs to be constantly addressed. Implement a mechanism to ensure safe administration of the medications.   DRIVING: Regarding driving, in patients with progressive memory problems, driving will be impaired. We advise to have someone else do the driving if trouble finding directions or if minor accidents are reported. Independent driving assessment is available to determine safety of driving.   If you are interested in the driving assessment, you can contact the following:  [employer] in [place]  [place] [phone] or [phone]    [place] refers to food and lifestyle choices that are based on the traditions of countries located on the [employer]. This way of eating has been shown to help prevent certain conditions and improve outcomes for people who have chronic diseases, like kidney disease and heart disease. What are tips for following this plan? Lifestyle  Cook and eat meals together with your family, when possible. Drink enough fluid to keep your urine clear or pale yellow. Be physically active every day. This includes: Aerobic exercise like running or swimming. Leisure activities like gardening, walking, or housework. Get 7-8 hours of sleep each night. If recommended by your health care provider, drink red wine in moderation. This means 1 glass a day for nonpregnant women and 2 glasses a day for men. A glass of wine equals 5 oz (150 mL). Reading food labels  Check the serving size of packaged foods. For foods such as rice and pasta, the serving size refers to the amount of cooked product, not dry. Check the total fat in packaged foods. Avoid foods that have saturated fat or trans fats. Check the ingredients list for added sugars, such as corn syrup. Shopping  At the grocery store, buy most of your food from the areas near the walls of the store. This includes: Fresh fruits  and vegetables (produce). Grains, beans, nuts, and seeds. Some of these may be available in unpackaged forms or large amounts (in bulk). Fresh seafood. Poultry and eggs. Low-fat dairy products. Buy whole ingredients instead of prepackaged foods. Buy fresh fruits and vegetables in-season from local farmers markets. Buy frozen fruits and vegetables in resealable bags. If you do not have access to quality fresh seafood, buy precooked frozen shrimp or canned  fish, such as tuna, salmon, or sardines. Buy small amounts of raw or cooked vegetables, salads, or olives from the deli or salad bar at your store. Stock your pantry so you always have certain foods on hand, such as olive oil, canned tuna, canned tomatoes, rice, pasta, and beans. Cooking  Cook foods with extra-virgin olive oil instead of using butter or other vegetable oils. Have meat as a side dish, and have vegetables or grains as your main dish. This means having meat in small portions or adding small amounts of meat to foods like pasta or stew. Use beans or vegetables instead of meat in common dishes like chili or lasagna. Experiment with different cooking methods. Try roasting or broiling vegetables instead of steaming or sauteing them. Add frozen vegetables to soups, stews, pasta, or rice. Add nuts or seeds for added healthy fat at each meal. You can add these to yogurt, salads, or vegetable dishes. Marinate fish or vegetables using olive oil, lemon juice, garlic, and fresh herbs. Meal planning  Plan to eat 1 vegetarian meal one day each week. Try to work up to 2 vegetarian meals, if possible. Eat seafood 2 or more times a week. Have healthy snacks readily available, such as: Vegetable sticks with hummus. Greek yogurt. Fruit and nut trail mix. Eat balanced meals throughout the week. This includes: Fruit: 2-3 servings a day Vegetables: 4-5 servings a day Low-fat dairy: 2 servings a day Fish, poultry, or lean meat: 1 serving a day Beans and legumes: 2 or more servings a week Nuts and seeds: 1-2 servings a day Whole grains: 6-8 servings a day Extra-virgin olive oil: 3-4 servings a day Limit red meat and sweets to only a few servings a month What are my food choices? Mediterranean diet Recommended Grains: Whole-grain pasta. Brown rice. Bulgar wheat. Polenta. Couscous. Whole-wheat bread. [place]. Vegetables: Artichokes. Beets. Broccoli. Cabbage. Carrots. Eggplant. Green  beans. Chard. Kale. Spinach. Onions. Leeks. Peas. Squash. Tomatoes. Peppers. Radishes. Fruits: Apples. Apricots. Avocado. Berries. Bananas. Cherries. Dates. Figs. Grapes. Lemons. Melon. Oranges. Peaches. Plums. Pomegranate. Meats and other protein foods: Beans. Almonds. Sunflower seeds. Pine nuts. Peanuts. [place]. Salmon. Scallops. Shrimp. [place]. Tilapia. Clams. Oysters. Eggs. Dairy: Low-fat milk. Cheese. Greek yogurt. Beverages: Water. Red wine. Herbal tea. Fats and oils: Extra virgin olive oil. Avocado oil. Grape seed oil. Sweets and desserts: [place] yogurt with honey. Baked apples. Poached pears. Trail mix. Seasoning and other foods: Basil. Cilantro. Coriander. Cumin. Mint. Parsley. Sage. Rosemary. Tarragon. Garlic. Oregano. Thyme. Pepper. Balsalmic vinegar. Tahini. Hummus. Tomato sauce. Olives. Mushrooms. Limit these Grains: Prepackaged pasta or rice dishes. Prepackaged cereal with added sugar. Vegetables: Deep fried potatoes (french fries). Fruits: Fruit canned in syrup. Meats and other protein foods: Beef. Pork. Lamb. Poultry with skin. Hot dogs. [place] Salines. Dairy: Ice cream. Sour cream. Whole milk. Beverages: Juice. Sugar-sweetened soft drinks. Beer. Liquor and spirits. Fats and oils: Butter. Canola oil. Vegetable oil. Beef fat (tallow). Lard. Sweets and desserts: Cookies. Cakes. Pies. Candy. Seasoning and other foods: Mayonnaise. Premade sauces and marinades. The items listed may not be a complete list. Talk  with your dietitian about what dietary choices are right for you. Summary The Mediterranean diet includes both food and lifestyle choices. Eat a variety of fresh fruits and vegetables, beans, nuts, seeds, and whole grains. Limit the amount of red meat and sweets that you eat. Talk with your health care provider about whether it is safe for you to drink red wine in moderation. This means 1 glass a day for nonpregnant women and 2 glasses a day for men. A glass of wine equals 5 oz (150  mL). This information is not intended to replace advice given to you by your health care provider. Make sure you discuss any questions you have with your health care provider. Document Released: 05/16/2016 Document Revised: 06/18/2016 Document Reviewed: 05/16/2016 Elsevier Interactive Patient Education  2017 [employer].

## 2022-11-07 ENCOUNTER — Other Ambulatory Visit: Payer: Self-pay | Admitting: Cardiovascular Disease

## 2022-11-07 DIAGNOSIS — I480 Paroxysmal atrial fibrillation: Secondary | ICD-10-CM

## 2022-11-07 NOTE — Telephone Encounter (Signed)
Eliquis '5mg'$  refill request received. Patient is 82 years old, weight-77.1kg, Crea-0.99 on 05/30/22, Diagnosis-Afib/flutter, and last seen by Dr. Gwenlyn Found on 08/13/22. Dose is appropriate based on dosing criteria. Will send in refill to requested pharmacy.

## 2022-11-11 ENCOUNTER — Other Ambulatory Visit: Payer: Self-pay

## 2022-11-11 ENCOUNTER — Telehealth: Payer: Self-pay | Admitting: Physician Assistant

## 2022-11-11 ENCOUNTER — Other Ambulatory Visit: Payer: Self-pay | Admitting: Physician Assistant

## 2022-11-11 MED ORDER — DIVALPROEX SODIUM 125 MG PO DR TAB: 250.0000 mg | DELAYED_RELEASE_TABLET | Freq: Every day | ORAL | 0 refills | Status: DC

## 2022-11-11 NOTE — Telephone Encounter (Signed)
Pt's wife called in and left a message with the access nurse. She stated she thinks her husband needs to take '250mg'$  of Depakote.

## 2022-11-11 NOTE — Telephone Encounter (Signed)
Called patients wife and let her know

## 2022-11-13 ENCOUNTER — Ambulatory Visit: Payer: PPO | Admitting: Physician Assistant

## 2022-11-13 NOTE — Progress Notes (Unsigned)
Assessment/Plan:   1.  Parkinsons Disease  -DaT scan abnormal in the past  -continue carbidopa/levodopa 25/100 1 tablet three times per day.  Can take an extra prn  -continue carbidopa/levodopa 50/200 at bed to see if it will help q hs walking when wakes up to use the bathroom  -Patient has second opinion coming up at [facility].  Referral was just placed on October 10, 2022.  2.  [facility]  -***  3.  Depression  -Primary care prescribing sertraline, 100 mg daily.  Doing well  4.  Bradycardia  -d/w patient that this is out of my field of expertise but needs f/u on.  His HR is in mid 40's and was when he had his EGD in May.    Subjective:   Walter Brown was seen today in follow up for Parkinsons disease.  My previous records were reviewed prior to todays visit as well as outside records available to me.  Wife accompanies the patient and supplements the history.  When I saw the patient last visit, his heart rate was only in the mid 40s, which was the same as when he had his EGD.  On a few days later, he went walking and was having trouble walking and he called the cardiology office and he was told to hold his Crestor.  He followed up just a couple days later with the nurse practitioner and mentioned endurance issues and they stopped the Crestor and started Zetia.  He emailed his primary care physician in September and asked for a referral to [facility] for Parkinsons.  Due to declined the referral because of his insurance.  He subsequently requested a referral to [facility]  Current prescribed movement disorder medications: Carbidopa/levodopa 25/100, 1 tablet 3 times per day Carbidopa/levodopa 50/200 at bedtime (added last visit)  ALLERGIES:   Allergies  Allergen Reactions   Albuterol Sulfate Palpitations   Azithromycin Other (See Comments)    Hepatotoxicity   Doxycycline Hyclate Other (See Comments)    Taken with Azithromycin and had Heaptotoxicity    CURRENT MEDICATIONS:  Outpatient Encounter  Medications as of 11/14/2022  Medication Sig   amiodarone (PACERONE) 200 MG tablet TAKE 1/2 TABLET BY MOUTH DAILY   amoxicillin-clavulanate (AUGMENTIN) 875-125 MG tablet Take 1 tablet by mouth 2 (two) times daily.   benzonatate (TESSALON) 200 MG capsule Take 1 capsule (200 mg total) by mouth 2 (two) times daily as needed for cough.   carbidopa-levodopa (SINEMET CR) 50-200 MG tablet TAKE 1 TABLET BY MOUTH EVERYDAY AT BEDTIME   carbidopa-levodopa (SINEMET IR) 25-100 MG tablet TAKE 1 TABLET BY MOUTH 3 (THREE) TIMES DAILY. 7AM/11AM/4PM   divalproex (DEPAKOTE) 125 MG DR tablet Take 2 tablets (250 mg total) by mouth at bedtime.   ELIQUIS 5 MG TABS tablet TAKE 1 TABLET BY MOUTH TWICE A DAY   ezetimibe (ZETIA) 10 MG tablet Take 1 tablet (10 mg total) by mouth daily.   hydrochlorothiazide (HYDRODIURIL) 25 MG tablet TAKE 1 TABLET BY MOUTH EVERY DAY   Melatonin 10 MG TABS Take 10 mg by mouth daily as needed.   Multiple Vitamins-Minerals (ICAPS AREDS 2 PO) Take 1 capsule by mouth in the morning and at bedtime.   sertraline (ZOLOFT) 100 MG tablet Take 1 tablet (100 mg total) by mouth daily.   No facility-administered encounter medications on file as of 11/14/2022.    Objective:   PHYSICAL EXAMINATION:    VITALS:   There were no vitals filed for this visit.  GEN:  The patient appears stated age and is in NAD. HEENT:  Normocephalic, atraumatic.  The mucous membranes are moist.  Neurological examination:  Orientation: The patient is alert and oriented x3. Cranial nerves: There is good facial symmetry with facial hypomimia. The speech is fluent and clear. Soft palate rises symmetrically and there is no tongue deviation. Hearing is intact to conversational tone. Sensation: Sensation is intact to light touch throughout Motor: Strength is at least antigravity x4.  Movement examination: Tone: There is mild increased tone in the RLE Abnormal movements: none Coordination:  There is min decremation  on the R Gait and Station: The patient has no difficulty arising out of a deep-seated chair without the use of the hands. The patient is slow and slightly unbalanced.  Carries the walking stick  I have reviewed and interpreted the following labs independently    Chemistry      Component Value Date/Time   NA 142 05/30/2022 1131   NA 142 04/05/2020 0849   K 3.6 05/30/2022 1131   CL 103 05/30/2022 1131   CO2 32 05/30/2022 1131   BUN 14 05/30/2022 1131   BUN 14 04/05/2020 0849   CREATININE 0.99 05/30/2022 1131   CREATININE 1.16 10/20/2020 1426      Component Value Date/Time   CALCIUM 8.8 05/30/2022 1131   ALKPHOS 60 05/30/2022 1131   AST 14 05/30/2022 1131   ALT 5 05/30/2022 1131   BILITOT 0.8 05/30/2022 1131   BILITOT 0.9 04/05/2020 0849       Lab Results  Component Value Date   WBC 6.3 05/30/2022   HGB 13.6 05/30/2022   HCT 40.7 05/30/2022   MCV 95.1 05/30/2022   PLT 109.0 (L) 05/30/2022    Lab Results  Component Value Date   TSH 1.84 05/30/2022     Total time spent on today's visit was *** minutes, including both face-to-face time and nonface-to-face time.  Time included that spent on review of records (prior notes available to me/labs/imaging if pertinent), discussing treatment and goals, answering patient's questions and coordinating care.  Cc:  [name], MD

## 2022-11-14 ENCOUNTER — Ambulatory Visit: Payer: PPO | Admitting: Neurology

## 2022-11-14 ENCOUNTER — Encounter: Payer: Self-pay | Admitting: Neurology

## 2022-11-14 VITALS — BP 126/86 | HR 66 | Ht 65.0 in | Wt 172.8 lb

## 2022-11-14 DIAGNOSIS — G20A2 Parkinson's disease without dyskinesia, with fluctuations: Secondary | ICD-10-CM | POA: Diagnosis not present

## 2022-11-14 DIAGNOSIS — R413 Other amnesia: Secondary | ICD-10-CM | POA: Diagnosis not present

## 2022-11-14 NOTE — Patient Instructions (Signed)
Local and Online Resources for Power over Parkinson's Group  February 2024   LOCAL [place] PARKINSON'S GROUPS   Power over Parkinson's Group:    Power Over Parkinson's Patient Education Group will be Wednesday, February 14th-*Hybrid meting*- in person at [place] [facility] location and via WEBEX, 2:00-3:00 pm.   Starting in November, Power over Parkinson's and Care Partner Groups will meet together, with plans for separate break out session for caregivers (*this will be evolving over the next few months) Upcoming Power over Parkinson's Meetings/Care Partner Support:  2nd Wednesdays of the month at 2 pm:  February 14th, March 13th  Contact Amy Marriott at [email or web address] if interested in participating in this group    LOCAL EVENTS AND NEW OFFERINGS  NEW PWR! Moves Classes in [place]!  Starting October 31, 2022.  Thursdays, January 25-December 12, 2022 at 11:45 am.   [facility].  Free to participate (through grant with Parkinson's Foundation) and registration required.  Please contact Sarah Chambers at [email or web address] to register. Let's Try Pickleball-$25 for 6 weeks of Pickleball, starting February 2nd.  Contact Sarah Chambers for more details.  [email or web address] Parkinson's Community Game Night at [facility], Date TBA in Late February, email Sarah for details [email or web address] THRIVE, an exercise group for early-onset Parkinson's Disease will resume meeting in February, email Sarah for details [email or web address] Parkinson's CarePartner Group for Men is in the works, if interested email Sarah  [email or web address] ACT [employer] Chair Yoga classes "Train and Gain", Fridays 10 am, [employer].  Contact Gina at [phone].  PWR! Moves Community Fitness Instructor-Led Classes offering at [employer]!  TUESDAYS and Wednesdays 1-2 pm.   Contact Christy Weaver at  [email or web address]  or  [phone] (Tuesday classes are modified for chair and standing only) Drumming for Parkinson's will be held on 2nd and 4th Mondays at 11:00 am.   Located at the [facility] [employer] ([address] [place]  Contact Jane Maydian at [email or web address] or [phone]  Dance for Parkinson 's classes will be on Tuesdays 9:30am-10:30am starting back in February. Located in the [facility], in the first floor of the [facility] ([address] To register:  [email or web address] or [phone] [employer] Parkinson's Tai Chi Class, Mondays at 11 am.  Call [phone] for details SAVE THE DATE and REGISTER:  [place] Chapter of Parkinson's Foundation:  Parkinson's Symposium.  Conversations about Parkinson's.  Saturday, December 07, 2022, 9:00 am-2:00 pm.  [facility] [employer] [facility] [employer] [facility], *In person or online via Zoom*.  Register at [email or web address] or call Diana at [phone].  ONLINE EDUCATION AND SUPPORT  Parkinson Foundation:  www.parkinson.org  PD Health at Home continues:  Mindfulness Mondays, Wellness Wednesdays, Fitness Fridays   Upcoming Education:   Parkinson's 101.  Wednesday, February 7th, 1-2 pm The Changing Landscape of Intimacy.  Wednesday, February 14th, 1-2 pm New to Parkinson's:  Care Partner Basics.  Wednesday, February 28th, 1-2 pm Expert Briefing:  Understanding Pain in Parkinson's.   Wednesday, April 10th,  1-2 pm  Register for virtual education and expert briefings (webinars) at www.[email or web address].org/resources-support/online-education Please check out their website to sign up for emails and see their full online offerings      Michael J Fox Foundation:  www.michaeljfox.org   Third Thursday Webinars:  On the third Thursday of every month at 12 p.m. ET, join our free live webinars to learn about various aspects of living with Parkinson's disease and our work to speed medical breakthroughs.  Upcoming  Webinar:  Therapies   for Tomorrow:  How Better Clinical Trial Design Leads to Better Treatments.  Thursday, February 15th at 12 noon. Check out additional information on their website to see their full online offerings    Davis Phinney Foundation:  www.davisphinneyfoundation.org  Upcoming Webinar:   Navigating Insurance and the HealthCare System.  Thursday, February 8th, 12 noon Webinar Series:  Living with Parkinson's Meetup.   Third Thursdays each month, 3 pm  Care Partner Monthly Meetup.  With Connie Carpenter Phinney.  First Tuesday of each month, 2 pm  Check out additional information to Live Well Today on their website    Parkinson and Movement Disorders (PMD) Alliance:  www.pmdalliance.org  [employer]:  Online Education Events  Sign up for emails, which are sent weekly to give you updates on programming and online offerings    Parkinson's Association of [place]:  www.parkinsonassociation.org  Information on online support groups, education events, and online exercises including Yoga, Parkinson's exercises and more-LOTS of information on links to PD resources and online events  Virtual Support Group through Parkinson's Association of [place]; next one is scheduled for Wednesday, Feb 7th  MOVEMENT AND EXERCISE OPPORTUNITIES  PWR! Moves Classes at [place] [facility].  Wednesdays 10 and 11 am.   Contact Amy Marriott, PT [email or web address] if interested.  PWR! Moves Class offerings at [employer]. *TUESDAYS* and Wednesdays 1-2 pm.    Contact Christy Weaver at  [email or web address]    Parkinson's Wellness Recovery (PWR! Moves)  www.pwr4life.org  Info on the PWR! Virtual Experience:  You will have access to our expertise?through self-assessment, guided plans that start with the PD-specific fundamentals, educational content, tips, Q&A with an expert, and a growing library of PD-specific pre-recorded and live exercise classes of varying types and  intensity - both physical and cognitive! If that is not enough, we offer 1:1 wellness consultations (in-person or virtual) to personalize your PWR! Virtual Experience.   Parkinson Foundation Fitness Fridays:   As part of the PD Health @ Home program, this free video series focuses each week on one aspect of fitness designed to support people living with Parkinson's.? These weekly videos highlight the Parkinson Foundation fitness guidelines for people with Parkinson's disease.  www.parkinson.org/resources-support/online-education/pdhealth#ff  Dance for PD website is offering free, live-stream classes throughout the week, as well as links to digital library of classes:  [email or web address]://danceforparkinsons.org/  Virtual dance and Pilates for Parkinson's classes: Click on the Community Tab> Parkinson's Movement Initiative Tab.  To register for classes and for more information, visit www.americandancefestival.org and click the "community" tab.   [employer] Parkinson's Cycling Classes   [employer]:  Thursdays @ Noon-Live classes at [employer] (Contact Margaret Hazen at [email or web address]?or [phone])  [facility] [employer]: Virtual Classes Mondays and Thursdays /Live classes Tuesday, Wednesday and Thursday (contact Marlee at [email or web address] ?or [phone])  [place] Steady Boxing  Varied levels of classes are offered Tuesdays and Thursdays at [employer] [facility] [facility].   Stretching with Maria weekly class is also offered for people with Parkinson's  To observe a class or for more information, call [phone] or email Hillary Savage at [email or web address]   ADDITIONAL SUPPORT AND RESOURCES  [employer]:Online Caregiver Education Opportunities:  www.well-springsolutions.org/caregiver-education/caregiver-support-group.  You may also contact Jodi Kolada at [email or web address] or [phone].     Family Caregiver Winter [facility]reat.  Thursday, March 7th, 10:15-1:45 at  [facility].  Register with Jodi Kolada (see above) Well-Spring Navigator:  Just1Navigator program, a?free service to help individuals and families through the journey of determining care for older adults.    The "Navigator" is a social worker, Nicole Reynolds, who will speak with a prospective client and/or loved ones to provide an assessment of the situation and a set of recommendations for a personalized care plan -- all free of charge, and whether?[employer] offers the needed service or not. If the need is not a service we provide, we are well-connected with reputable programs in town that we can refer you to.  www.well-springsolutions.org or to speak with the Navigator, call [phone].     

## 2022-11-18 ENCOUNTER — Telehealth: Payer: Self-pay | Admitting: Neurology

## 2022-11-18 NOTE — Telephone Encounter (Signed)
Entered

## 2022-11-18 NOTE — Telephone Encounter (Signed)
Patient left message with the after hour service on 11-15-22 4:49 pm   Patient is taking mrirsabegan 51m daily and also the tamsulosin daily ( that is how they  were spelled on the  AN)

## 2022-11-18 NOTE — Telephone Encounter (Signed)
Do you know what this medication is. I don't want to make an error when entering mrirsabegan

## 2022-11-21 DIAGNOSIS — H01003 Unspecified blepharitis right eye, unspecified eyelid: Secondary | ICD-10-CM | POA: Diagnosis not present

## 2022-11-21 DIAGNOSIS — H353221 Exudative age-related macular degeneration, left eye, with active choroidal neovascularization: Secondary | ICD-10-CM | POA: Diagnosis not present

## 2022-11-21 DIAGNOSIS — H353112 Nonexudative age-related macular degeneration, right eye, intermediate dry stage: Secondary | ICD-10-CM | POA: Diagnosis not present

## 2022-11-21 DIAGNOSIS — H01006 Unspecified blepharitis left eye, unspecified eyelid: Secondary | ICD-10-CM | POA: Diagnosis not present

## 2022-11-21 DIAGNOSIS — H43813 Vitreous degeneration, bilateral: Secondary | ICD-10-CM | POA: Diagnosis not present

## 2022-11-21 DIAGNOSIS — H35372 Puckering of macula, left eye: Secondary | ICD-10-CM | POA: Diagnosis not present

## 2022-11-26 ENCOUNTER — Telehealth: Payer: Self-pay | Admitting: Neurology

## 2022-11-26 NOTE — Telephone Encounter (Signed)
Patient is returning a call. °

## 2022-11-26 NOTE — Telephone Encounter (Signed)
Pt called in and left a message. He wanted to get a cruiser walker.

## 2022-11-26 NOTE — Telephone Encounter (Signed)
Called and left voice mail

## 2022-11-26 NOTE — Telephone Encounter (Signed)
Called patients wife back and patients son has already ordered the u step walker and it is on the way

## 2022-12-03 ENCOUNTER — Ambulatory Visit (INDEPENDENT_AMBULATORY_CARE_PROVIDER_SITE_OTHER): Payer: PPO | Admitting: Family Medicine

## 2022-12-03 ENCOUNTER — Encounter: Payer: Self-pay | Admitting: Family Medicine

## 2022-12-03 VITALS — BP 122/64 | HR 62 | Temp 98.0°F | Resp 16 | Ht 65.0 in | Wt 168.2 lb

## 2022-12-03 DIAGNOSIS — E782 Mixed hyperlipidemia: Secondary | ICD-10-CM | POA: Diagnosis not present

## 2022-12-03 DIAGNOSIS — R296 Repeated falls: Secondary | ICD-10-CM

## 2022-12-03 DIAGNOSIS — I10 Essential (primary) hypertension: Secondary | ICD-10-CM | POA: Diagnosis not present

## 2022-12-03 DIAGNOSIS — R739 Hyperglycemia, unspecified: Secondary | ICD-10-CM | POA: Diagnosis not present

## 2022-12-03 DIAGNOSIS — G20A1 Parkinson's disease without dyskinesia, without mention of fluctuations: Secondary | ICD-10-CM | POA: Diagnosis not present

## 2022-12-03 DIAGNOSIS — R351 Nocturia: Secondary | ICD-10-CM | POA: Diagnosis not present

## 2022-12-03 LAB — CBC WITH DIFFERENTIAL/PLATELET
Basophils Absolute: 0.1 10*3/uL (ref 0.0–0.1)
Basophils Relative: 0.9 % (ref 0.0–3.0)
Eosinophils Absolute: 0.1 10*3/uL (ref 0.0–0.7)
Eosinophils Relative: 1.3 % (ref 0.0–5.0)
HCT: 42.4 % (ref 39.0–52.0)
Hemoglobin: 14.4 g/dL (ref 13.0–17.0)
Lymphocytes Relative: 17.3 % (ref 12.0–46.0)
Lymphs Abs: 1.3 10*3/uL (ref 0.7–4.0)
MCHC: 34.1 g/dL (ref 30.0–36.0)
MCV: 96 fl (ref 78.0–100.0)
Monocytes Absolute: 0.8 10*3/uL (ref 0.1–1.0)
Monocytes Relative: 11 % (ref 3.0–12.0)
Neutro Abs: 5.2 10*3/uL (ref 1.4–7.7)
Neutrophils Relative %: 69.5 % (ref 43.0–77.0)
Platelets: 149 10*3/uL — ABNORMAL LOW (ref 150.0–400.0)
RBC: 4.41 Mil/uL (ref 4.22–5.81)
RDW: 14 % (ref 11.5–15.5)
WBC: 7.4 10*3/uL (ref 4.0–10.5)

## 2022-12-03 LAB — COMPREHENSIVE METABOLIC PANEL WITH GFR
ALT: 10 U/L (ref 0–53)
AST: 16 U/L (ref 0–37)
Albumin: 3.9 g/dL (ref 3.5–5.2)
Alkaline Phosphatase: 68 U/L (ref 39–117)
BUN: 24 mg/dL — ABNORMAL HIGH (ref 6–23)
CO2: 33 meq/L — ABNORMAL HIGH (ref 19–32)
Calcium: 9.3 mg/dL (ref 8.4–10.5)
Chloride: 104 meq/L (ref 96–112)
Creatinine, Ser: 1.02 mg/dL (ref 0.40–1.50)
GFR: 68.86 mL/min
Glucose, Bld: 53 mg/dL — ABNORMAL LOW (ref 70–99)
Potassium: 3.4 meq/L — ABNORMAL LOW (ref 3.5–5.1)
Sodium: 144 meq/L (ref 135–145)
Total Bilirubin: 0.7 mg/dL (ref 0.2–1.2)
Total Protein: 6.4 g/dL (ref 6.0–8.3)

## 2022-12-03 LAB — URINALYSIS, ROUTINE W REFLEX MICROSCOPIC
Bilirubin Urine: NEGATIVE
Hgb urine dipstick: NEGATIVE
Leukocytes,Ua: NEGATIVE
Nitrite: NEGATIVE
RBC / HPF: NONE SEEN (ref 0–?)
Specific Gravity, Urine: >=1.03 — AB (ref 1.000–1.030)
Total Protein, Urine: NEGATIVE
Urine Glucose: NEGATIVE
Urobilinogen, UA: 0.2 (ref 0.0–1.0)
pH: 6 (ref 5.0–8.0)

## 2022-12-03 LAB — LIPID PANEL
Cholesterol: 153 mg/dL (ref 0–200)
HDL: 66.8 mg/dL
LDL Cholesterol: 71 mg/dL (ref 0–99)
NonHDL: 86.14
Total CHOL/HDL Ratio: 2
Triglycerides: 76 mg/dL (ref 0.0–149.0)
VLDL: 15.2 mg/dL (ref 0.0–40.0)

## 2022-12-03 LAB — HEMOGLOBIN A1C: Hgb A1c MFr Bld: 5.2 % (ref 4.6–6.5)

## 2022-12-03 LAB — TSH: TSH: 1.75 u[IU]/mL (ref 0.35–5.50)

## 2022-12-03 NOTE — Progress Notes (Signed)
Subjective:   By signing my name below, I, Kellie Simmering, attest that this documentation has been prepared under the direction and in the presence of [name], MD., 12/03/2022.   Patient ID: Walter Brown, male    DOB: 08-01-1941, 82 y.o.   MRN: [id number]  No chief complaint on file.  HPI Patient is in today for a comprehensive physical exam and follow up on chronic medical concerns. He is accompanied by his wife and denies CP/palpitations/SOB/ HA/congestion/fever/chills/GI or GU symptoms.  Immunizations Patient is up to date on all available immunizations.  Recurrent Falls/Parkinson's Disease Patient reports that his Parkinson's Disease is worsening due to increasing falls. He has recently been falling frequently while getting up to use the bathroom at night and has  fallen 3 times this week. He continues seeing neurologist Dr. Carles Collet and taking Carbidopa-Levodopa 25-100 mg tid as prescribed. Due to his worsening condition, he has scheduled an appointment with neurology at [facility] in 01/2023. He is interested in physical therapy.  Past Medical History:  Diagnosis Date   Allergy    Anemia    mild   Anxiety    Arthritis 04/06/2017   Asthma    childhood   ASVD (arteriosclerotic vascular disease) 09/07/2018   Basal cell carcinoma    skin- on nose- basal cell (20 yrs ago) forehead 1 year ago   BPH (benign prostatic hyperplasia)    Bradycardia 06/28/2018   Cerumen impaction 08/10/2012   Chicken pox as child   Coronary artery calcification 11/23/2019   Elevated LFTs    [place] measles as a child   Hyperlipidemia    Hypertension    Internal hemorrhoids    Irregular cardiac rhythm 03/10/2018   Macular degeneration    Major depressive disorder 08/01/2012   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   Obstructive sleep apnea 11/09/2018   Patient reports mild symptoms; was not prescribed a CPAP machine   Otitis externa 08/10/2012   PAF  (paroxysmal atrial fibrillation)    Parkinson's disease 05/19/2019   Thrombocytopenia    TIA (transient ischemic attack) 03/10/2018    Past Surgical History:  Procedure Laterality Date   BELPHAROPTOSIS REPAIR     very young, b/l   EXCISIONAL HEMORRHOIDECTOMY     HYDROCELE EXCISION / REPAIR  2012   b/l   SKIN CANCER EXCISION     nose and forehead, basal cell CA   UPPER GASTROINTESTINAL ENDOSCOPY      Family History  Problem Relation Age of Onset   Hypertension Mother    Other Mother        MRSA   Heart disease Mother        stents   Heart disease Father    Hypertension Father    Prostate cancer Father 52   Parkinson's disease Father    Stroke Maternal Grandmother    Prostate cancer Maternal Grandfather    Cancer Paternal Grandfather        EYE   ADD / ADHD Son        ADHD   Other Son 24       part of 1 lung removed- due to infection   Colon cancer Neg Hx    Esophageal cancer Neg Hx    Rectal cancer Neg Hx    Stomach cancer Neg Hx     Social History   Socioeconomic History   Marital status: Married    Spouse name: Not on file   Number of children:  2   Years of education: 16   Highest education level: Bachelor's degree (e.g., BA, AB, BS)  Occupational History   Occupation: retired  Tobacco Use   Smoking status: Former    Packs/day: 1.50    Years: 15.00    Total pack years: 22.50    Types: Cigarettes    Start date: 10/07/1978   Smokeless tobacco: Never  Vaping Use   Vaping Use: Never used  Substance and Sexual Activity   Alcohol use: Yes    Alcohol/week: 7.0 - 14.0 standard drinks of alcohol    Types: 7 - 14 Glasses of wine per week    Comment: 1-2 glasses of wine with dinner   Drug use: No   Sexual activity: Yes    Comment: lives with wife, 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  Other Topics Concern   Not on file  Social History Narrative   Right handed    Occasionally caffeine   Retired    Scientist, physiological Strain: Low Risk  (03/26/2022)   Overall Financial Resource Strain (CARDIA)    Difficulty of Paying Living Expenses: Not hard at all  Food Insecurity: No Food Insecurity (03/26/2022)   Hunger Vital Sign    Worried About Running Out of Food in the Last Year: Never true    Ran Out of Food in the Last Year: Never true  Transportation Needs: No Transportation Needs (03/26/2022)   PRAPARE - Hydrologist (Medical): No    Lack of Transportation (Non-Medical): No  Physical Activity: Sufficiently Active (03/26/2022)   Exercise Vital Sign    Days of Exercise per Week: 5 days    Minutes of Exercise per Session: 50 min  Stress: Stress Concern Present (03/26/2022)   [place]    Feeling of Stress : To some extent  Social Connections: Moderately Isolated (03/26/2022)   Social Connection and Isolation Panel [NHANES]    Frequency of Communication with Friends and Family: More than three times a week    Frequency of Social Gatherings with Friends and Family: Once a week    Attends Religious Services: Never    Marine scientist or Organizations: No    Attends Archivist Meetings: Never    Marital Status: Married  Human resources officer Violence: Not At Risk (03/26/2022)   Humiliation, Afraid, Rape, and Kick questionnaire    Fear of Current or Ex-Partner: No    Emotionally Abused: No    Physically Abused: No    Sexually Abused: No    Outpatient Medications Prior to Visit  Medication Sig Dispense Refill   amiodarone (PACERONE) 200 MG tablet TAKE 1/2 TABLET BY MOUTH DAILY 45 tablet 3   amoxicillin-clavulanate (AUGMENTIN) 875-125 MG tablet Take 1 tablet by mouth 2 (two) times daily. 20 tablet 0   benzonatate (TESSALON) 200 MG capsule Take 1 capsule (200 mg total) by mouth 2 (two) times daily as needed for cough. 20 capsule 0   calcium-vitamin D  (OSCAL WITH D) 500-5 MG-MCG tablet Take 1 tablet by mouth.     carbidopa-levodopa (SINEMET CR) 50-200 MG tablet TAKE 1 TABLET BY MOUTH EVERYDAY AT BEDTIME 90 tablet 0   carbidopa-levodopa (SINEMET IR) 25-100 MG tablet TAKE 1 TABLET BY MOUTH 3 (THREE) TIMES DAILY. 7AM/11AM/4PM 270 tablet 0   divalproex (DEPAKOTE) 125 MG DR tablet Take 2 tablets (250 mg total) by mouth  at bedtime. 180 tablet 0   ELIQUIS 5 MG TABS tablet TAKE 1 TABLET BY MOUTH TWICE A DAY 180 tablet 1   ezetimibe (ZETIA) 10 MG tablet Take 1 tablet (10 mg total) by mouth daily. 90 tablet 3   hydrochlorothiazide (HYDRODIURIL) 25 MG tablet TAKE 1 TABLET BY MOUTH EVERY DAY 90 tablet 1   Melatonin 10 MG TABS Take 10 mg by mouth daily as needed.     mirabegron ER (MYRBETRIQ) 50 MG TB24 tablet Take 50 mg by mouth daily.     Multiple Vitamins-Minerals (ICAPS AREDS 2 PO) Take 1 capsule by mouth in the morning and at bedtime.     sertraline (ZOLOFT) 100 MG tablet Take 1 tablet (100 mg total) by mouth daily. 90 tablet 1   No facility-administered medications prior to visit.    Allergies  Allergen Reactions   Albuterol Sulfate Palpitations   Azithromycin Other (See Comments)    Hepatotoxicity   Doxycycline Hyclate Other (See Comments)    Taken with Azithromycin and had Heaptotoxicity    Review of Systems  Constitutional:  Negative for chills and fever.  HENT:  Negative for congestion.   Respiratory:  Negative for shortness of breath.   Cardiovascular:  Negative for chest pain and palpitations.  Gastrointestinal:  Negative for abdominal pain, blood in stool, constipation, diarrhea, nausea and vomiting.  Genitourinary:  Negative for dysuria, frequency, hematuria and urgency.  Musculoskeletal:  Positive for falls.  Skin:           Neurological:  Negative for headaches.       Objective:    Physical Exam Constitutional:      General: He is not in acute distress.    Appearance: Normal appearance. He is normal weight. He is not  ill-appearing.  HENT:     Head: Normocephalic and atraumatic.     Right Ear: Tympanic membrane, ear canal and external ear normal.     Left Ear: Tympanic membrane, ear canal and external ear normal.     Nose: Nose normal.     Mouth/Throat:     Mouth: Mucous membranes are moist.     Pharynx: Oropharynx is clear.  Eyes:     General:        Right eye: No discharge.        Left eye: No discharge.     Extraocular Movements: Extraocular movements intact.     Right eye: No nystagmus.     Left eye: No nystagmus.     Pupils: Pupils are equal, round, and reactive to light.  Neck:     Vascular: No carotid bruit.  Cardiovascular:     Rate and Rhythm: Normal rate and regular rhythm.     Pulses: Normal pulses.     Heart sounds: Normal heart sounds. No murmur heard.    No gallop.  Pulmonary:     Effort: Pulmonary effort is normal. No respiratory distress.     Breath sounds: Normal breath sounds. No wheezing or rales.  Abdominal:     General: Bowel sounds are normal.     Palpations: Abdomen is soft.     Tenderness: There is no abdominal tenderness. There is no guarding.  Musculoskeletal:        General: Normal range of motion.     Cervical back: Normal range of motion.     Right lower leg: No edema.     Left lower leg: No edema.     Comments: Muscle strength 5/5 on upper and  lower extremities.   Lymphadenopathy:     Cervical: No cervical adenopathy.  Skin:    General: Skin is warm and dry.  Neurological:     Mental Status: He is alert and oriented to person, place, and time.     Sensory: Sensation is intact.     Motor: Motor function is intact.     Coordination: Coordination is intact.     Deep Tendon Reflexes:     Reflex Scores:      Patellar reflexes are 2+ on the right side and 2+ on the left side. Psychiatric:        Mood and Affect: Mood normal.        Behavior: Behavior normal.        Judgment: Judgment normal.     There were no vitals taken for this visit. Wt Readings  from Last 3 Encounters:  11/14/22 172 lb 12.8 oz (78.4 kg)  10/31/22 170 lb (77.1 kg)  09/29/22 168 lb 12.8 oz (76.6 kg)    Diabetic Foot Exam - Simple   No data filed    Lab Results  Component Value Date   WBC 6.3 05/30/2022   HGB 13.6 05/30/2022   HCT 40.7 05/30/2022   PLT 109.0 (L) 05/30/2022   GLUCOSE 84 05/30/2022   CHOL 158 05/30/2022   TRIG 73.0 05/30/2022   HDL 71.00 05/30/2022   LDLCALC 73 05/30/2022   ALT 5 05/30/2022   AST 14 05/30/2022   NA 142 05/30/2022   K 3.6 05/30/2022   CL 103 05/30/2022   CREATININE 0.99 05/30/2022   BUN 14 05/30/2022   CO2 32 05/30/2022   TSH 1.84 05/30/2022   INR 1.0 12/15/2017   HGBA1C 5.3 05/30/2022    Lab Results  Component Value Date   TSH 1.84 05/30/2022   Lab Results  Component Value Date   WBC 6.3 05/30/2022   HGB 13.6 05/30/2022   HCT 40.7 05/30/2022   MCV 95.1 05/30/2022   PLT 109.0 (L) 05/30/2022   Lab Results  Component Value Date   NA 142 05/30/2022   K 3.6 05/30/2022   CO2 32 05/30/2022   GLUCOSE 84 05/30/2022   BUN 14 05/30/2022   CREATININE 0.99 05/30/2022   BILITOT 0.8 05/30/2022   ALKPHOS 60 05/30/2022   AST 14 05/30/2022   ALT 5 05/30/2022   PROT 6.3 05/30/2022   ALBUMIN 4.1 05/30/2022   CALCIUM 8.8 05/30/2022   ANIONGAP 12 08/27/2018   GFR 71.63 05/30/2022   Lab Results  Component Value Date   CHOL 158 05/30/2022   Lab Results  Component Value Date   HDL 71.00 05/30/2022   Lab Results  Component Value Date   LDLCALC 73 05/30/2022   Lab Results  Component Value Date   TRIG 73.0 05/30/2022   Lab Results  Component Value Date   CHOLHDL 2 05/30/2022   Lab Results  Component Value Date   HGBA1C 5.3 05/30/2022      Assessment & Plan:  Colonoscopy: Last completed on 10/07/2009. No need to be repeated.  Advanced Directives: Encouraged patient to complete advanced care planning documents.  Healthy Lifestyle: Encouraged heart healthy diet and hydration.  Immunizations:  Reviewed patient's immunization history.  Labs: Routine blood work and urinary analysis/culture ordered today.  Physical Therapy: Referral placed to [facility] PT due to recurrent falls and Parkinson's Disease.  Supplements: Recommended fatty acids and multivitamins. Problem List Items Addressed This Visit   None  No orders of the defined types were  placed in this encounter.  I, Kellie Simmering, personally preformed the services described in this documentation.  All medical record entries made by the scribe were at my direction and in my presence.  I have reviewed the chart and discharge instructions (if applicable) and agree that the record reflects my personal performance and is accurate and complete. 12/03/2022  I,Mohammed Iqbal,acting as a scribe for Penni Homans, MD.,have documented all relevant documentation on the behalf of Penni Homans, MD,as directed by  Penni Homans, MD while in the presence of Penni Homans, MD.  Kellie Simmering

## 2022-12-03 NOTE — Patient Instructions (Signed)
Preventive Care 65 Years and Older, Male Preventive care refers to lifestyle choices and visits with your health care provider that can promote health and wellness. Preventive care visits are also called wellness exams. What can I expect for my preventive care visit? Counseling During your preventive care visit, your health care provider may ask about your: Medical history, including: Past medical problems. Family medical history. History of falls. Current health, including: Emotional well-being. Home life and relationship well-being. Sexual activity. Memory and ability to understand (cognition). Lifestyle, including: Alcohol, nicotine or tobacco, and drug use. Access to firearms. Diet, exercise, and sleep habits. Work and work environment. Sunscreen use. Safety issues such as seatbelt and bike helmet use. Physical exam Your health care provider will check your: Height and weight. These may be used to calculate your BMI (body mass index). BMI is a measurement that tells if you are at a healthy weight. Waist circumference. This measures the distance around your waistline. This measurement also tells if you are at a healthy weight and may help predict your risk of certain diseases, such as type 2 diabetes and high blood pressure. Heart rate and blood pressure. Body temperature. Skin for abnormal spots. What immunizations do I need?  Vaccines are usually given at various ages, according to a schedule. Your health care provider will recommend vaccines for you based on your age, medical history, and lifestyle or other factors, such as travel or where you work. What tests do I need? Screening Your health care provider may recommend screening tests for certain conditions. This may include: Lipid and cholesterol levels. Diabetes screening. This is done by checking your blood sugar (glucose) after you have not eaten for a while (fasting). Hepatitis C test. Hepatitis B test. HIV (human  immunodeficiency virus) test. STI (sexually transmitted infection) testing, if you are at risk. Lung cancer screening. Colorectal cancer screening. Prostate cancer screening. Abdominal aortic aneurysm (AAA) screening. You may need this if you are a current or former smoker. Talk with your health care provider about your test results, treatment options, and if necessary, the need for more tests. Follow these instructions at home: Eating and drinking  Eat a diet that includes fresh fruits and vegetables, whole grains, lean protein, and low-fat dairy products. Limit your intake of foods with high amounts of sugar, saturated fats, and salt. Take vitamin and mineral supplements as recommended by your health care provider. Do not drink alcohol if your health care provider tells you not to drink. If you drink alcohol: Limit how much you have to 0-2 drinks a day. Know how much alcohol is in your drink. In the [place] one drink equals one 12 oz bottle of beer (355 mL), one 5 oz glass of wine (148 mL), or one 1 oz glass of hard liquor (44 mL). Lifestyle Brush your teeth every morning and night with fluoride toothpaste. Floss one time each day. Exercise for at least 30 minutes 5 or more days each week. Do not use any products that contain nicotine or tobacco. These products include cigarettes, chewing tobacco, and vaping devices, such as e-cigarettes. If you need help quitting, ask your health care provider. Do not use drugs. If you are sexually active, practice safe sex. Use a condom or other form of protection to prevent STIs. Take aspirin only as told by your health care provider. Make sure that you understand how much to take and what form to take. Work with your health care provider to find out whether it is safe   and beneficial for you to take aspirin daily. Ask your health care provider if you need to take a cholesterol-lowering medicine (statin). Find healthy ways to manage stress, such  as: Meditation, yoga, or listening to music. Journaling. Talking to a trusted person. Spending time with friends and family. Safety Always wear your seat belt while driving or riding in a vehicle. Do not drive: If you have been drinking alcohol. Do not ride with someone who has been drinking. When you are tired or distracted. While texting. If you have been using any mind-altering substances or drugs. Wear a helmet and other protective equipment during sports activities. If you have firearms in your house, make sure you follow all gun safety procedures. Minimize exposure to UV radiation to reduce your risk of skin cancer. What's next? Visit your health care provider once a year for an annual wellness visit. Ask your health care provider how often you should have your eyes and teeth checked. Stay up to date on all vaccines. This information is not intended to replace advice given to you by your health care provider. Make sure you discuss any questions you have with your health care provider. Document Revised: 03/21/2021 Document Reviewed: 03/21/2021 [employer] Patient Education  2023 [employer]  

## 2022-12-04 DIAGNOSIS — R351 Nocturia: Secondary | ICD-10-CM | POA: Insufficient documentation

## 2022-12-04 DIAGNOSIS — R296 Repeated falls: Secondary | ICD-10-CM

## 2022-12-04 HISTORY — DX: Repeated falls: R29.6

## 2022-12-04 HISTORY — DX: Nocturia: R35.1

## 2022-12-04 LAB — URINE CULTURE
MICRO NUMBER:: 14620279
SPECIMEN QUALITY:: ADEQUATE

## 2022-12-04 NOTE — Assessment & Plan Note (Signed)
Well controlled, no changes to meds. Encouraged heart healthy diet such as the DASH diet and exercise as tolerated.  °

## 2022-12-04 NOTE — Assessment & Plan Note (Signed)
hgba1c acceptable, minimize simple carbs. Increase exercise as tolerated.  

## 2022-12-04 NOTE — Assessment & Plan Note (Addendum)
Working with neurology and has an appt at [facility] in April

## 2022-12-04 NOTE — Assessment & Plan Note (Signed)
Tolerating statin, encouraged heart healthy diet, avoid trans fats, minimize simple carbs and saturated fats. Increase exercise as tolerated 

## 2022-12-10 ENCOUNTER — Encounter: Payer: Self-pay | Admitting: Psychology

## 2022-12-11 ENCOUNTER — Institutional Professional Consult (permissible substitution): Payer: PPO | Admitting: Psychology

## 2022-12-11 ENCOUNTER — Ambulatory Visit: Payer: PPO | Admitting: Psychology

## 2022-12-11 ENCOUNTER — Encounter: Payer: Self-pay | Admitting: Psychology

## 2022-12-11 ENCOUNTER — Ambulatory Visit: Payer: PPO | Admitting: Rehabilitative and Restorative Service Providers"

## 2022-12-11 ENCOUNTER — Ambulatory Visit: Payer: PPO

## 2022-12-11 DIAGNOSIS — G20A1 Parkinson's disease without dyskinesia, without mention of fluctuations: Secondary | ICD-10-CM | POA: Diagnosis not present

## 2022-12-11 DIAGNOSIS — F0670 Mild neurocognitive disorder due to known physiological condition without behavioral disturbance: Secondary | ICD-10-CM | POA: Diagnosis not present

## 2022-12-11 DIAGNOSIS — R4189 Other symptoms and signs involving cognitive functions and awareness: Secondary | ICD-10-CM

## 2022-12-11 NOTE — Progress Notes (Signed)
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   NAB Orientation, Form 1 29/29 26/29 --- ---        Cognitive Screening:       Raw Score Raw Score Percentile   SLUMS: --- 21/30 --- ---        RBANS, Form A: Standard Score/ Scaled Score Standard Score/ Scaled Score Percentile   Total Score --- 88 21 Below Average  Immediate Memory --- 90 25 Average    List Learning --- 8 25 Average    Story Memory --- 9 37 Average  Visuospatial/Constructional --- 78 7 Well Below Average    Figure Copy --- 6 9 Below Average    Line Orientation --- 13/20 10-16 Below Average  Language --- 89 23 Below Average    Picture Naming --- 10/10 >75 Above Average    Semantic Fluency --- 5 5 Well Below Average  Attention --- 100 50 Average    Digit Span --- 14 91 Well Above Average    Coding --- 6 9 Below Average  Delayed Memory --- 104 61 Average    List Recall --- 4/10 51-75 Average    List Recognition --- 20/20 51-75 Average    Story Recall --- 7 16 Below Average    Story Recognition --- 10/12 53-67 Average    Figure Recall --- 11 63 Average    Figure Recognition  5/8 39-57 Average          Intellectual Functioning:       Standard Score Standard Score Percentile   Test of Premorbid Functioning: 100 92 30 Average        Attention/Executive Function:      Trail Making Test (TMT): Raw Score (T Score) Raw Score (T Score) Percentile     Part A 38 secs.,  0 errors (49) 56 secs., 0 errors (37) 9 Below Average    Part B 185 secs.,  4 errors (35) 196 secs.,  4 errors (39) 14 Below Average         Symbol Digit Modalities Test (SDMT): Raw Score (Z-Score) Raw Score (Z-Score) Percentile     Oral 29 (-1.76) 24 (-2.13) 2 Well Below Average        NAB Attention Module, Form 1: T Score T Score Percentile     Digits Forward 50 55 69 Average    Digits Backwards 40 34 5 Well Below Average        D-KEFS Color-Word Interference Test: Raw Score (Scaled Score) Raw Score (Scaled Score) Percentile     Color Naming 50 secs. (3) 58 secs. (2) <1 Exceptionally Low    Word Reading 21 secs. (12) 33 secs. (7) 16 Below Average    Inhibition 125 secs. (2) 138 secs. (5) 5 Well Below Average      Total Errors 7 errors (6) 13 errors (3) 1 Exceptionally Low    Inhibition/Switching 126 secs. (4) 179 secs. (1) <1 Exceptionally Low      Total Errors 9 errors (4) 8 errors (6) 9 Below Average        D-KEFS 20 Questions Test: Scaled Score Scaled Score Percentile     Total Weighted Achievement Score '9 8 25 '$ Average    Initial Abstraction Score 8 14 91 Well Above Average        Language:      Verbal Fluency Test: Raw Score (Scaled Score) Raw Score (Scaled Score) Percentile     Phonemic Fluency (CFL) 40 (12) 24 (8) 25 Average    Category Fluency 53 (14) 32 (9) 37 Average  *Based on [employer] Older Normative Studies (MOANS)            NAB Language Module, Form 1: T Score T Score Percentile     Naming 30/31 (58) 31/31 (63) 91 Well Above Average        Visuospatial/Visuoconstruction:       Raw Score Raw Score Percentile   Clock Drawing: 8/10 4/10 --- Impaired        NAB Spatial Module, Form 1: T  Score T Score Percentile     Visual Discrimination 33 43 25 Average         Scaled Score Scaled Score Percentile   WAIS-IV Block Design: '11 3 1 '$ Exceptionally Low  WAIS-IV Matrix Reasoning: '13 8 25 '$ Average        Mood and Personality:       Raw Score Raw Score Percentile   Geriatric Depression Scale: 12 10 --- Mild  Geriatric Anxiety Scale: 14 11 --- Minimal    Somatic 3 2 --- Minimal    Cognitive 3 5 --- Mild    Affective 8 4 --- Mild        Additional Questionnaires:       Raw Score Raw Score Percentile   PROMIS Sleep Disturbance Questionnaire: 16 17 --- None to Slight   Informed Consent and Coding/Compliance:   The current evaluation represents a clinical evaluation for the purposes previously outlined by the referral source and is in no way reflective of a forensic evaluation.   Mr. Plona was provided with a verbal description of the nature and purpose of the present neuropsychological evaluation. Also reviewed were the foreseeable risks and/or discomforts and benefits of the procedure, limits of confidentiality, and mandatory reporting requirements of this provider. The patient was given the opportunity to ask questions and receive answers about the evaluation. Oral consent to participate was provided by the patient.   This  evaluation was conducted by Christia Reading, Ph.D., ABPP-CN, board certified clinical neuropsychologist. Mr. Rogacki completed a clinical interview with Dr. Melvyn Novas, billed as one unit [phone], and 150 minutes of cognitive testing and scoring, billed as one unit [phone] and four additional units 96139. Psychometrist Cruzita Lederer, B.S., assisted Dr. Melvyn Novas with test administration and scoring procedures. As a separate and discrete service, Dr. Melvyn Novas spent a total of 160 minutes in interpretation and report writing billed as one unit [phone] and two units 96133.

## 2022-12-11 NOTE — Progress Notes (Signed)
   Psychometrician Note   Cognitive testing was administered to Walter Brown by Cruzita Lederer, B.S. (psychometrist) under the supervision of Dr. Christia Reading, Ph.D., licensed psychologist on 12/11/2022. Walter Brown did not appear overtly distressed by the testing session per behavioral observation or responses across self-report questionnaires. Rest breaks were offered.    The battery of tests administered was selected by Dr. Christia Reading, Ph.D. with consideration to Walter Brown current level of functioning, the nature of his symptoms, emotional and behavioral responses during interview, level of literacy, observed level of motivation/effort, and the nature of the referral question. This battery was communicated to the psychometrist. Communication between Dr. Christia Reading, Ph.D. and the psychometrist was ongoing throughout the evaluation and Dr. Christia Reading, Ph.D. was immediately accessible at all times. Dr. Christia Reading, Ph.D. provided supervision to the psychometrist on the date of this service to the extent necessary to assure the quality of all services provided.    Walter Brown will return within approximately 1-2 weeks for an interactive feedback session with Dr. Melvyn Novas at which time his test performances, clinical impressions, and treatment recommendations will be reviewed in detail. Walter Brown understands he can contact our office should he require our assistance before this time.  A total of 150 minutes of billable time were spent face-to-face with Walter Brown by the psychometrist. This includes both test administration and scoring time. Billing for these services is reflected in the clinical report generated by Dr. Christia Reading, Ph.D.  This note reflects time spent with the psychometrician and does not include test scores or any clinical interpretations made by Dr. Melvyn Novas. The full report will follow in a separate note.

## 2022-12-17 ENCOUNTER — Ambulatory Visit: Payer: PPO | Admitting: Psychology

## 2022-12-17 DIAGNOSIS — G20A1 Parkinson's disease without dyskinesia, without mention of fluctuations: Secondary | ICD-10-CM

## 2022-12-17 DIAGNOSIS — F0670 Mild neurocognitive disorder due to known physiological condition without behavioral disturbance: Secondary | ICD-10-CM

## 2022-12-17 NOTE — Progress Notes (Signed)
   Neuropsychology Feedback Session Walter Brown. [facility]  Reason for Referral:   Walter Brown is a 82 y.o. 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   Feedback:   [name] completed a comprehensive neuropsychological evaluation on 12/11/2022. Please refer to that encounter for the full report and recommendations. Briefly, results suggested a primary impairment in complex attention, as well as performance variability across processing speed, executive functioning, and visuospatial abilities. Relative to his previous evaluation in March 2021, a mild decline was exhibited across complex attention. More subtle declines could be argued across processing speed, executive functioning, verbal fluency, and some aspects of visuoconstructional abilities. Other assessed domains exhibited stability. Regarding etiology, his pattern of dysfunction continues to be very consistent with expectation given his history of Parkinson's disease. This continues to represent the most likely cause for ongoing dysfunction and subtle to mild cognitive decline. Other factors that could exacerbate impairment due to Parkinson's disease could include mild psychiatric distress or other psychosocial stressors. I do not see compelling evidence for Lewy body disease or another more rare parkinsonian presentation presently.     [name] was accompanied by his wife during the current feedback session. Content of the current session focused on the results of his neuropsychological evaluation. [name] was given the opportunity to ask questions and his questions were answered. He was encouraged to reach out should additional questions arise. A copy of his  report was provided at the conclusion of the visit.      Greater than 31 minutes were spent preparing for, conducting, and documenting the current feedback session with [name], billed as one unit [phone].

## 2022-12-28 ENCOUNTER — Telehealth: Payer: Self-pay | Admitting: Physician Assistant

## 2022-12-28 NOTE — Telephone Encounter (Signed)
Pt called stating his BP was elevated to 165/90 with HR 65. I attempted a callback x 2, left VM, unable to reach patient.

## 2023-01-01 ENCOUNTER — Other Ambulatory Visit (INDEPENDENT_AMBULATORY_CARE_PROVIDER_SITE_OTHER): Payer: PPO

## 2023-01-01 DIAGNOSIS — I10 Essential (primary) hypertension: Secondary | ICD-10-CM | POA: Diagnosis not present

## 2023-01-01 LAB — BASIC METABOLIC PANEL
BUN: 17 mg/dL (ref 6–23)
CO2: 31 mEq/L (ref 19–32)
Calcium: 9.1 mg/dL (ref 8.4–10.5)
Chloride: 102 mEq/L (ref 96–112)
Creatinine, Ser: 0.97 mg/dL (ref 0.40–1.50)
GFR: 73.1 mL/min (ref >60.00)
Glucose, Bld: 69 mg/dL — ABNORMAL LOW (ref 70–99)
Potassium: 3.8 mEq/L (ref 3.5–5.1)
Sodium: 139 mEq/L (ref 135–145)

## 2023-01-01 NOTE — Addendum Note (Signed)
Addended by: Manuela Schwartz on: 01/01/2023 11:51 AM   Modules accepted: Orders

## 2023-01-03 ENCOUNTER — Other Ambulatory Visit: Payer: Self-pay | Admitting: Family Medicine

## 2023-01-03 ENCOUNTER — Other Ambulatory Visit: Payer: Self-pay | Admitting: Internal Medicine

## 2023-01-03 ENCOUNTER — Other Ambulatory Visit: Payer: Self-pay | Admitting: Neurology

## 2023-01-03 DIAGNOSIS — G20A1 Parkinson's disease without dyskinesia, without mention of fluctuations: Secondary | ICD-10-CM

## 2023-01-06 ENCOUNTER — Telehealth: Payer: Self-pay | Admitting: Internal Medicine

## 2023-01-06 ENCOUNTER — Other Ambulatory Visit: Payer: Self-pay | Admitting: Internal Medicine

## 2023-01-06 NOTE — Telephone Encounter (Signed)
Inbound call from patient, is requesting to speak with a nurse in regards to pantoprazole refill. States he hasn't been seen in months and is unsure why it was refilled.

## 2023-01-06 NOTE — Telephone Encounter (Signed)
Pt advised that electronic refill request was sent by pharmacy. His letter from Dr [name] after most recent endoscopy indicated that the patient may continue pantoprazole. Patient advised that it is available for pick up if needed, if not, he can leave it at the pharmacy.

## 2023-01-20 ENCOUNTER — Telehealth: Payer: Self-pay

## 2023-01-20 NOTE — Telephone Encounter (Signed)
Pt calling Pt- requesting prescription for wheelchair. He can be reached at [phone].

## 2023-01-22 NOTE — Telephone Encounter (Signed)
Called pt lvm to call our office back  Need to know what wheelchair is needed.

## 2023-01-24 NOTE — Telephone Encounter (Signed)
Called pt lvm we need to know  What type of wheelchair your want so we can ordered.

## 2023-02-01 NOTE — Progress Notes (Deleted)
   Cardiology Clinic Note   Date: 02/01/2023 ID: REIN POPOV, DOB 06-23-1941, MRN [id number]  Primary Cardiologist:  Nanetta Batty, MD  Patient Profile    Walter Brown is a 82 y.o. male who presents to the clinic today for evaluation of hypertension.  Past medical history significant for: Coronary artery calcification. Nuclear stress test 04/23/2018: Normal, low risk study. CT chest 12/18/202020: Cardiomegaly and heavy coronary artery atherosclerotic calcifications. PAF/A-flutter. Cardiac event monitor 10/03/2020: SR/SB (HR occasionally in the 30s and a.m. hours).  PACs and short runs of SVT.  Runs of a flutter with variable VR. Hypertension. Hyperlipidemia. Lipid panel 12/03/2022: LDL 71, HDL 67, TG 76, total 153. TIA. OSA. Asthma. Parkinson disease.   History of Present Illness    Walter Brown was first seen by Dr. Tomie China on 03/18/2018 for hypertension at the request of Dr. Abner Greenspan.  Patient transferred care to Dr. Allyson Sabal on 12/26/2020.  He continues to be followed by Dr. Allyson Sabal for the above outlined history.  Last seen in the office by Dr. Allyson Sabal on 08/13/2022.  At that time he was doing well and no medication changes were made.  Patient contacted the office on 12/28/2022 with complaints of elevated BP.  2 attempts were made to contact patient without success.  Today, patient ***  Hypertension. BP today *** Patient denies headaches, dizziness or vision changes. Continue hydrochlorothiazide. Coronary artery calcifications.  Chest CT December 2020 showed heavy coronary artery atherosclerotic calcifications.  Normal low risk nuclear stress test July 2019.  Patient*** Continue Zetia. PAF/A-flutter.  Cardiac monitor December 2021 showed PACs and short runs of SVT as well as runs of a flutter with variable VR.  Patient*** Denies bleeding concerns.*** Continue Eliquis, amiodarone. Appropriate Eliquis dose. Hyperlipidemia.  LDL February 2024 71, at goal.  Continue Zetia.  ROS: All  other systems reviewed and are otherwise negative except as noted in History of Present Illness.  Studies Reviewed    ECG personally reviewed by me today: ***  No significant changes from ***  Risk Assessment/Calculations    {Does this patient have ATRIAL FIBRILLATION?[phone]} No BP recorded.  {Refresh Note OR Click here to enter BP  :1}***        Physical Exam    VS:  There were no vitals taken for this visit. , BMI There is no height or weight on file to calculate BMI.  GEN: Well nourished, well developed, in no acute distress. Neck: No JVD or carotid bruits. Cardiac: *** RRR. No murmurs. No rubs or gallops.   Respiratory:  Respirations regular and unlabored. Clear to auscultation without rales, wheezing or rhonchi. GI: Soft, nontender, nondistended. Extremities: Radials/DP/PT 2+ and equal bilaterally. No clubbing or cyanosis. No edema ***  Skin: Warm and dry, no rash. Neuro: Strength intact.  Assessment & Plan   ***  Disposition: ***     {Are you ordering a CV Procedure (e.g. stress test, cath, DCCV, TEE, etc)?   Press F2        :[id number]}   Signed, Etta Grandchild. Bhumi Godbey, DNP, NP-C

## 2023-02-03 ENCOUNTER — Telehealth: Payer: Self-pay | Admitting: Physician Assistant

## 2023-02-03 ENCOUNTER — Ambulatory Visit: Payer: PPO | Attending: Student | Admitting: Student

## 2023-02-03 ENCOUNTER — Encounter: Payer: Self-pay | Admitting: Student

## 2023-02-03 VITALS — BP 132/70 | HR 78 | Ht 65.0 in | Wt 172.0 lb

## 2023-02-03 DIAGNOSIS — I2584 Coronary atherosclerosis due to calcified coronary lesion: Secondary | ICD-10-CM

## 2023-02-03 DIAGNOSIS — I10 Essential (primary) hypertension: Secondary | ICD-10-CM | POA: Diagnosis not present

## 2023-02-03 DIAGNOSIS — I4892 Unspecified atrial flutter: Secondary | ICD-10-CM | POA: Diagnosis not present

## 2023-02-03 DIAGNOSIS — I2510 Atherosclerotic heart disease of native coronary artery without angina pectoris: Secondary | ICD-10-CM | POA: Diagnosis not present

## 2023-02-03 DIAGNOSIS — I480 Paroxysmal atrial fibrillation: Secondary | ICD-10-CM | POA: Diagnosis not present

## 2023-02-03 DIAGNOSIS — E785 Hyperlipidemia, unspecified: Secondary | ICD-10-CM | POA: Diagnosis not present

## 2023-02-03 NOTE — Patient Instructions (Signed)
Medication Instructions:  Your physician recommends that you continue on your current medications as directed. Please refer to the Current Medication list given to you today.  *If you need a refill on your cardiac medications before your next appointment, please call your pharmacy*   Follow-Up: At [facility], you and your health needs are our priority.  As part of our continuing mission to provide you with exceptional heart care, we have created designated Provider Care Teams.  These Care Teams include your primary Cardiologist (physician) and Advanced Practice Providers (APPs -  Physician Assistants and Nurse Practitioners) who all work together to provide you with the care you need, when you need it.  We recommend signing up for the patient portal called "MyChart".  Sign up information is provided on this After Visit Summary.  MyChart is used to connect with patients for Virtual Visits (Telemedicine).  Patients are able to view lab/test results, encounter notes, upcoming appointments, etc.  Non-urgent messages can be sent to your provider as well.   To learn more about what you can do with MyChart, go to [email or web address].    Your next appointment:   6 month(s)  Provider:   Nanetta Batty, MD     Other Instructions Please keep a blood pressure log for 2 weeks. Please take your blood pressure 2 hours after your morning medication. Let us know if blood pressure  is consistently greater than 130/80.  HOW TO TAKE YOUR BLOOD PRESSURE: Rest 5 minutes before taking your blood pressure. Don't smoke or drink caffeinated beverages for at least 30 minutes before. Take your blood pressure before (not after) you eat. Sit comfortably with your back supported and both feet on the floor (don't cross your legs). Elevate your arm to heart level on a table or a desk. Use the proper sized cuff. It should fit smoothly and snugly around your bare upper arm. There should be enough room to slip  a fingertip under the cuff. The bottom edge of the cuff should be 1 inch above the crease of the elbow. Ideally, take 3 measurements at one sitting and record the average.

## 2023-02-03 NOTE — Progress Notes (Signed)
Cardiology Clinic Note   Date: 02/03/2023 ID: Walter Brown, DOB December 13, 1940, MRN [id number]  Primary Cardiologist:  Nanetta Batty, MD  Patient Profile    Walter Brown is a 82 y.o. male who presents to the clinic today for evaluation of hypertension.  Past medical history significant for: Coronary artery calcification. Nuclear stress test 04/23/2018: Normal, low risk study. CT chest 12/18/202020: Cardiomegaly and heavy coronary artery atherosclerotic calcifications. PAF/A-flutter. Cardiac event monitor 10/03/2020: SR/SB (HR occasionally in the 30s and a.m. hours).  PACs and short runs of SVT.  Runs of a flutter with variable VR. Hypertension. Hyperlipidemia. Lipid panel 12/03/2022: LDL 71, HDL 67, TG 76, total 153. TIA. OSA. Asthma. Parkinson disease.   History of Present Illness    Walter Brown was first seen by Dr. Tomie China on 03/18/2018 for hypertension at the request of Dr. Abner Greenspan.  Patient transferred care to Dr. Allyson Sabal on 12/26/2020.  He continues to be followed by Dr. Allyson Sabal for the above outlined history.  Last seen in the office by Dr. Allyson Sabal on 08/13/2022.  At that time he was doing well and no medication changes were made.  Patient contacted the office on 12/28/2022 with complaints of elevated BP.  2 attempts were made to contact patient without success.  Today, patient is accompanied by his wife.  He reports intermittent elevated BP.  On 12/28/2022 patient had 2 readings 198/100, 195/108.  He denies headaches, dizziness, vision changes at that time.  Patient also had BP readings <130/80. Patient denies shortness of breath or dyspnea on exertion. No chest pain, pressure, or tightness. Denies lower extremity edema, orthopnea, or PND. No palpitations.  He has cardiac awareness of A-fib and has not felt he has been in recently.  He denies blood in stool or urine or other bleeding concerns.   ROS: All other systems reviewed and are otherwise negative except as noted in History  of Present Illness.  Studies Reviewed    ECG is not ordered today.          Physical Exam    VS:  BP 132/70 (BP Location: Left Arm, Patient Position: Sitting, Cuff Size: Normal)   Pulse 78   Ht 5\' 5"  (1.651 m)   Wt 172 lb (78 kg)   BMI 28.62 kg/m  , BMI Body mass index is 28.62 kg/m.  GEN: Well nourished, well developed, in no acute distress. Neck: No JVD or carotid bruits. Cardiac:  RRR. No murmurs. No rubs or gallops.   Respiratory:  Respirations regular and unlabored. Clear to auscultation without rales, wheezing or rhonchi. GI: Soft, nontender, nondistended. Extremities: Radials/DP/PT 2+ and equal bilaterally. No clubbing or cyanosis. No edema.  Skin: Warm and dry, no rash. Neuro: Strength intact.  Assessment & Plan    Hypertension. BP today 132/70 on intake and 138/80 on recheck.  Patient denies headaches, dizziness or vision changes. He reports intermittent elevated BP at home. On 12/28/2022 he had readings of 198/100 and 195/108. He will keep a 2 week BP log and contact the office if BP readings consistently >130/80.  Continue hydrochlorothiazide. May consider the addition of amlodipine. Would be very cautious with medication additions given Parkinson disease. If BP is up will have patient come in for a nurse visit to check BP and calibrate home cuff.  Coronary artery calcifications.  Chest CT December 2020 showed heavy coronary artery atherosclerotic calcifications.  Normal low risk nuclear stress test July 2019.  Patient denies chest pain, pressure or tightness. Continue Zetia.  PAF/A-flutter.  Cardiac monitor December 2021 showed PACs and short runs of SVT as well as runs of a flutter with variable VR.  Patient denies recent palpitations. He has regular rate and rhythm today. Denies bleeding concerns.Continue Eliquis, amiodarone. Appropriate Eliquis dose. Hyperlipidemia.  LDL February 2024 71, at goal.  Continue Zetia.  Disposition: 2 week BP log. Return in 6 months or  sooner as needed.         Signed, Etta Grandchild. Emari Demmer, DNP, NP-C

## 2023-02-03 NOTE — Telephone Encounter (Signed)
New message    1. Which medications need to be refilled? (please list name of each medication and dose if known) divalproex (DEPAKOTE) 125 MG DR tablet   2. Which pharmacy/location (including street and city if local pharmacy) is medication to be sent to?[id number] - [place], [place] - [address]   3. Do they need a 30 day or 90 day supply? 30 day supply

## 2023-02-04 ENCOUNTER — Other Ambulatory Visit: Payer: Self-pay | Admitting: Neurology

## 2023-02-04 ENCOUNTER — Other Ambulatory Visit: Payer: Self-pay

## 2023-02-04 ENCOUNTER — Other Ambulatory Visit: Payer: Self-pay | Admitting: Family Medicine

## 2023-02-04 DIAGNOSIS — G20A1 Parkinson's disease without dyskinesia, without mention of fluctuations: Secondary | ICD-10-CM

## 2023-02-04 DIAGNOSIS — R4189 Other symptoms and signs involving cognitive functions and awareness: Secondary | ICD-10-CM

## 2023-02-04 DIAGNOSIS — F0670 Mild neurocognitive disorder due to known physiological condition without behavioral disturbance: Secondary | ICD-10-CM

## 2023-02-04 MED ORDER — DIVALPROEX SODIUM 125 MG PO DR TAB: 250.0000 mg | DELAYED_RELEASE_TABLET | Freq: Every day | ORAL | 0 refills | Status: DC

## 2023-02-07 DIAGNOSIS — G20A1 Parkinson's disease without dyskinesia, without mention of fluctuations: Secondary | ICD-10-CM | POA: Diagnosis not present

## 2023-02-13 DIAGNOSIS — H35372 Puckering of macula, left eye: Secondary | ICD-10-CM | POA: Diagnosis not present

## 2023-02-13 DIAGNOSIS — H43813 Vitreous degeneration, bilateral: Secondary | ICD-10-CM | POA: Diagnosis not present

## 2023-02-13 DIAGNOSIS — H353221 Exudative age-related macular degeneration, left eye, with active choroidal neovascularization: Secondary | ICD-10-CM | POA: Diagnosis not present

## 2023-02-13 DIAGNOSIS — H353112 Nonexudative age-related macular degeneration, right eye, intermediate dry stage: Secondary | ICD-10-CM | POA: Diagnosis not present

## 2023-02-27 ENCOUNTER — Other Ambulatory Visit: Payer: Self-pay | Admitting: Cardiovascular Disease

## 2023-02-27 ENCOUNTER — Other Ambulatory Visit (HOSPITAL_BASED_OUTPATIENT_CLINIC_OR_DEPARTMENT_OTHER): Payer: Self-pay | Admitting: Family

## 2023-02-27 DIAGNOSIS — I480 Paroxysmal atrial fibrillation: Secondary | ICD-10-CM

## 2023-02-27 NOTE — Telephone Encounter (Signed)
Prescription refill request for Eliquis received. Indication: afib  Last office visit:[place], 02/03/2023 Scr: 0.97, 01/01/2023 Age: 82 yo  Weight: 78 kg   Refill sent.

## 2023-03-04 ENCOUNTER — Telehealth: Payer: Self-pay | Admitting: Neurology

## 2023-03-04 ENCOUNTER — Other Ambulatory Visit: Payer: Self-pay

## 2023-03-04 DIAGNOSIS — G20A1 Parkinson's disease without dyskinesia, without mention of fluctuations: Secondary | ICD-10-CM

## 2023-03-04 DIAGNOSIS — R4189 Other symptoms and signs involving cognitive functions and awareness: Secondary | ICD-10-CM

## 2023-03-04 MED ORDER — DIVALPROEX SODIUM 125 MG PO DR TAB: 250.0000 mg | DELAYED_RELEASE_TABLET | Freq: Every day | ORAL | 0 refills | Status: DC

## 2023-03-04 NOTE — Telephone Encounter (Signed)
Called patient he needed a refill on his Depakote I have sent it to the pharmacy for him

## 2023-03-04 NOTE — Telephone Encounter (Signed)
Patient would like to speak with chelsea. Patient left a message with AN

## 2023-03-10 NOTE — Assessment & Plan Note (Signed)
Doing well on Amiodarone

## 2023-03-10 NOTE — Assessment & Plan Note (Signed)
Tolerating statin, encouraged heart healthy diet, avoid trans fats, minimize simple carbs and saturated fats. Increase exercise as tolerated 

## 2023-03-10 NOTE — Assessment & Plan Note (Signed)
Well controlled, no changes to meds. Encouraged heart healthy diet such as the DASH diet and exercise as tolerated.  °

## 2023-03-10 NOTE — Assessment & Plan Note (Signed)
hgba1c acceptable, minimize simple carbs. Increase exercise as tolerated.  

## 2023-03-10 NOTE — Assessment & Plan Note (Signed)
Following with Neurology

## 2023-03-11 ENCOUNTER — Ambulatory Visit (INDEPENDENT_AMBULATORY_CARE_PROVIDER_SITE_OTHER): Payer: PPO | Admitting: Family Medicine

## 2023-03-11 ENCOUNTER — Telehealth: Payer: Self-pay | Admitting: Family Medicine

## 2023-03-11 ENCOUNTER — Other Ambulatory Visit: Payer: Self-pay

## 2023-03-11 VITALS — BP 124/72 | HR 82 | Temp 97.5°F | Resp 16 | Ht 65.0 in | Wt 167.8 lb

## 2023-03-11 DIAGNOSIS — G20A1 Parkinson's disease without dyskinesia, without mention of fluctuations: Secondary | ICD-10-CM | POA: Diagnosis not present

## 2023-03-11 DIAGNOSIS — E782 Mixed hyperlipidemia: Secondary | ICD-10-CM | POA: Diagnosis not present

## 2023-03-11 DIAGNOSIS — R739 Hyperglycemia, unspecified: Secondary | ICD-10-CM

## 2023-03-11 DIAGNOSIS — I480 Paroxysmal atrial fibrillation: Secondary | ICD-10-CM

## 2023-03-11 DIAGNOSIS — R2681 Unsteadiness on feet: Secondary | ICD-10-CM

## 2023-03-11 DIAGNOSIS — I10 Essential (primary) hypertension: Secondary | ICD-10-CM | POA: Diagnosis not present

## 2023-03-11 DIAGNOSIS — R350 Frequency of micturition: Secondary | ICD-10-CM | POA: Diagnosis not present

## 2023-03-11 LAB — URINALYSIS, ROUTINE W REFLEX MICROSCOPIC
Bilirubin Urine: NEGATIVE
Hgb urine dipstick: NEGATIVE
Leukocytes,Ua: NEGATIVE
Nitrite: NEGATIVE
RBC / HPF: NONE SEEN (ref 0–?)
Specific Gravity, Urine: 1.02 (ref 1.000–1.030)
Total Protein, Urine: NEGATIVE
Urine Glucose: NEGATIVE
Urobilinogen, UA: 0.2 (ref 0.0–1.0)
pH: 6 (ref 5.0–8.0)

## 2023-03-11 NOTE — Telephone Encounter (Signed)
Dme order for wheelchair has been placed in  [facility] for Dr.Blyth to sign and will fax on Thursday.

## 2023-03-11 NOTE — Progress Notes (Signed)
Subjective:   By signing my name below, I, Vickey Sages, attest that this documentation has been prepared under the direction and in the presence of [name], MD., 03/11/2023.   Patient ID: Walter Brown, male    DOB: [birth year], 82 y.o.   MRN: [id number]  No chief complaint on file.  HPI Patient is in today for an office visit and is accompanied by his wife. He denies recent hospitalization, febrile illness, CP/palpitations/SOB/HA/fever/chills.   Parkinson's Disease Patient reports that his dizziness, unsteadiness, and weakness associated with Parkinson's diease is worsening, causing recurrent falls. He experiences weakness bilaterally in the thighs, making it difficult for him to stand up. His wife states that he has been ambulating with a scooter for the past 6-8 weeks, but he is interested in switching to a wheelchair and receiving occupational/physical therapy. He experiences constant constipation, but has been managing this with Senokot Plus tablets daily. He has also been experiencing occasional nausea but denies abdominal pain, blood in stool, or diarrhea. Additionally, he has been following with urologist Dr. Berniece Salines at [facility] and reports he has been taking a medication to help him urinate, which has been helpful. He denies dysuria, hematuria, frequency, or urgency.  Past Medical History:  Diagnosis Date   Allergy    Anemia    mild   Arthritis 04/06/2017   Asthma    childhood   ASVD (arteriosclerotic vascular disease) 09/07/2018   Back pain with radiation 04/26/2013   Low back with LLE radiculopathy   Basal cell carcinoma    skin- on nose- basal cell (20 yrs ago) forehead 1 year ago     Sees Dr Elmon Else of dermatology   BPH (benign prostatic hyperplasia)    Bradycardia 06/28/2018   Cataract 10/07/2014   Cerumen impaction 08/10/2012   Chicken pox as child   Coronary artery calcification 11/23/2019   Dysphagia 09/11/2021   Elevated LFTs     Essential hypertension    ETD (Eustachian tube dysfunction), right 01/17/2022   Generalized anxiety disorder    [place] measles as a child   Hemoptysis 01/17/2022   Hyperglycemia 04/25/2020   Hyperlipidemia    Insomnia 02/06/2021   Internal hemorrhoids    Irregular cardiac rhythm 03/10/2018   Lipoma 08/04/2018   Macular degeneration    Major depressive disorder 08/01/2012   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   Nocturia 12/04/2022   Obstructive sleep apnea 11/09/2018   Patient reports mild symptoms; was not prescribed a CPAP machine   Otitis externa 08/10/2012   PAF (paroxysmal atrial fibrillation)    Palpitations 09/07/2018   Parkinson's disease 05/19/2019   Recurrent falls 12/04/2022   Thrombocytopenia    TIA (transient ischemic attack) 03/10/2018   Unsteady gait 09/11/2021    Past Surgical History:  Procedure Laterality Date   BELPHAROPTOSIS REPAIR     very young, b/l   EXCISIONAL HEMORRHOIDECTOMY     EYE SURGERY  2017   eyelids   HYDROCELE EXCISION / REPAIR  2012   b/l   SKIN CANCER EXCISION     nose and forehead, basal cell CA   UPPER GASTROINTESTINAL ENDOSCOPY      Family History  Problem Relation Age of Onset   Hypertension Mother    Other Mother        MRSA   Heart disease Mother        stents   ADD / ADHD Mother    Arthritis Mother    Asthma  Mother    Depression Mother    Vision loss Mother    Heart disease Father    Hypertension Father    Prostate cancer Father 50   Parkinson's disease Father    Cancer Father    Hearing loss Father    Stroke Father    Vision loss Father    Varicose Veins Father    Stroke Maternal Grandmother    Prostate cancer Maternal Grandfather    Asthma Maternal Grandfather    Cancer Maternal Grandfather    Cancer Paternal Grandfather        EYE   Hearing loss Paternal Grandfather    ADD / ADHD Son        ADHD   Other Son 24       part of 1 lung removed- due to infection   Alcohol  abuse Son    Depression Son    Alcohol abuse Son    Alcohol abuse Maternal Uncle    Depression Maternal Uncle    Asthma Maternal Uncle    Heart disease Paternal Uncle    Colon cancer Neg Hx    Esophageal cancer Neg Hx    Rectal cancer Neg Hx    Stomach cancer Neg Hx     Social History   Socioeconomic History   Marital status: Married    Spouse name: Not on file   Number of children: 2   Years of education: 16   Highest education level: Bachelor's degree (e.g., BA, AB, BS)  Occupational History   Occupation: Retired  Tobacco Use   Smoking status: Former    Packs/day: 1.50    Years: 15.00    Additional pack years: 0.00    Total pack years: 22.50    Types: Cigarettes    Start date: 10/06/1978    Quit date: 10/07/1978    Years since quitting: 44.4   Smokeless tobacco: Never   Tobacco comments:    QUIT 1980  Vaping Use   Vaping Use: Never used  Substance and Sexual Activity   Alcohol use: Yes    Alcohol/week: 7.0 standard drinks of alcohol    Types: 7 Glasses of wine per week    Comment: wine with meals   Drug use: Not Currently   Sexual activity: Yes    Comment: lives with wife, 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  Other Topics Concern   Not on file  Social History Narrative   Right handed    Occasionally caffeine   Retired   Chemical engineer Strain: Low Risk  (03/26/2022)   Overall Financial Resource Strain (CARDIA)    Difficulty of Paying Living Expenses: Not hard at all  Food Insecurity: No Food Insecurity (03/26/2022)   Hunger Vital Sign    Worried About Running Out of Food in the Last Year: Never true    Ran Out of Food in the Last Year: Never true  Transportation Needs: No Transportation Needs (03/26/2022)   PRAPARE - Administrator, Civil Service (Medical): No    Lack of Transportation (Non-Medical): No  Physical Activity: Sufficiently Active (03/26/2022)    Exercise Vital Sign    Days of Exercise per Week: 5 days    Minutes of Exercise per Session: 50 min  Stress: Stress Concern Present (03/26/2022)   [employer] of Occupational Health - Occupational Stress Questionnaire    Feeling of Stress : To some extent  Social Connections:  Moderately Isolated (03/26/2022)   Social Connection and Isolation Panel [NHANES]    Frequency of Communication with Friends and Family: More than three times a week    Frequency of Social Gatherings with Friends and Family: Once a week    Attends Religious Services: Never    Database administrator or Organizations: No    Attends Banker Meetings: Never    Marital Status: Married  Catering manager Violence: Not At Risk (03/26/2022)   Humiliation, Afraid, Rape, and Kick questionnaire    Fear of Current or Ex-Partner: No    Emotionally Abused: No    Physically Abused: No    Sexually Abused: No    Outpatient Medications Prior to Visit  Medication Sig Dispense Refill   amiodarone (PACERONE) 200 MG tablet TAKE 1/2 TABLET BY MOUTH DAILY 45 tablet 3   calcium-vitamin D (OSCAL WITH D) 500-5 MG-MCG tablet Take 1 tablet by mouth.     carbidopa-levodopa (SINEMET CR) 50-200 MG tablet TAKE 1 TABLET BY MOUTH EVERYDAY AT BEDTIME 90 tablet 0   carbidopa-levodopa (SINEMET IR) 25-100 MG tablet TAKE 1 TABLET BY MOUTH 3 (THREE) TIMES DAILY. 7AM/11AM/4PM 270 tablet 0   divalproex (DEPAKOTE) 125 MG DR tablet Take 2 tablets (250 mg total) by mouth daily. 180 tablet 0   ELIQUIS 5 MG TABS tablet TAKE 1 TABLET BY MOUTH TWICE A DAY 180 tablet 1   ezetimibe (ZETIA) 10 MG tablet TAKE 1 TABLET BY MOUTH EVERY DAY 90 tablet 3   hydrochlorothiazide (HYDRODIURIL) 25 MG tablet TAKE 1 TABLET BY MOUTH EVERY DAY 90 tablet 1   Melatonin 10 MG TABS Take 10 mg by mouth daily as needed.     mirabegron ER (MYRBETRIQ) 50 MG TB24 tablet Take 50 mg by mouth daily.     Multiple Vitamins-Minerals (ICAPS AREDS 2 PO) Take 1 capsule by mouth in  the morning and at bedtime.     pantoprazole (PROTONIX) 40 MG tablet TAKE 1 TABLET BY MOUTH EVERY DAY 90 tablet 0   sertraline (ZOLOFT) 100 MG tablet Take 1 tablet (100 mg total) by mouth daily. 90 tablet 1   No facility-administered medications prior to visit.    Allergies  Allergen Reactions   Albuterol Sulfate Palpitations   Azithromycin Other (See Comments)    Hepatotoxicity   Doxycycline Hyclate Other (See Comments)    Taken with Azithromycin and had Heaptotoxicity    Review of Systems  Constitutional:  Positive for malaise/fatigue. Negative for chills and fever.  Respiratory:  Negative for shortness of breath.   Cardiovascular:  Negative for chest pain and palpitations.  Gastrointestinal:  Positive for constipation. Negative for abdominal pain, blood in stool, diarrhea, nausea and vomiting.  Genitourinary:  Negative for dysuria, frequency, hematuria and urgency.  Musculoskeletal:  Positive for falls.  Skin:           Neurological:  Positive for dizziness and weakness. Negative for headaches.       Objective:    Physical Exam Vitals reviewed.  Constitutional:      Appearance: Normal appearance. He is not ill-appearing.  HENT:     Head: Normocephalic and atraumatic.     Nose: Nose normal.  Eyes:     Conjunctiva/sclera: Conjunctivae normal.  Cardiovascular:     Rate and Rhythm: Normal rate.     Pulses: Normal pulses.     Heart sounds: Normal heart sounds. No murmur heard. Pulmonary:     Effort: Pulmonary effort is normal.     Breath  sounds: Normal breath sounds. No wheezing.  Abdominal:     Palpations: Abdomen is soft. There is no mass.     Tenderness: There is no abdominal tenderness.  Musculoskeletal:     Cervical back: Normal range of motion.     Right lower leg: No edema.     Left lower leg: No edema.  Skin:    General: Skin is warm and dry.  Neurological:     General: No focal deficit present.     Mental Status: He is alert and oriented to person,  place, and time.     Motor: Weakness present.     Gait: Gait abnormal.  Psychiatric:        Mood and Affect: Mood normal.     There were no vitals taken for this visit. Wt Readings from Last 3 Encounters:  02/03/23 172 lb (78 kg)  12/03/22 168 lb 3.2 oz (76.3 kg)  11/14/22 172 lb 12.8 oz (78.4 kg)    Diabetic Foot Exam - Simple   No data filed    Lab Results  Component Value Date   WBC 7.4 12/03/2022   HGB 14.4 12/03/2022   HCT 42.4 12/03/2022   PLT 149.0 (L) 12/03/2022   GLUCOSE 69 (L) 01/01/2023   CHOL 153 12/03/2022   TRIG 76.0 12/03/2022   HDL 66.80 12/03/2022   LDLCALC 71 12/03/2022   ALT 10 12/03/2022   AST 16 12/03/2022   NA 139 01/01/2023   K 3.8 01/01/2023   CL 102 01/01/2023   CREATININE 0.97 01/01/2023   BUN 17 01/01/2023   CO2 31 01/01/2023   TSH 1.75 12/03/2022   INR 1.0 12/15/2017   HGBA1C 5.2 12/03/2022    Lab Results  Component Value Date   TSH 1.75 12/03/2022   Lab Results  Component Value Date   WBC 7.4 12/03/2022   HGB 14.4 12/03/2022   HCT 42.4 12/03/2022   MCV 96.0 12/03/2022   PLT 149.0 (L) 12/03/2022   Lab Results  Component Value Date   NA 139 01/01/2023   K 3.8 01/01/2023   CO2 31 01/01/2023   GLUCOSE 69 (L) 01/01/2023   BUN 17 01/01/2023   CREATININE 0.97 01/01/2023   BILITOT 0.7 12/03/2022   ALKPHOS 68 12/03/2022   AST 16 12/03/2022   ALT 10 12/03/2022   PROT 6.4 12/03/2022   ALBUMIN 3.9 12/03/2022   CALCIUM 9.1 01/01/2023   ANIONGAP 12 08/27/2018   GFR 73.10 01/01/2023   Lab Results  Component Value Date   CHOL 153 12/03/2022   Lab Results  Component Value Date   HDL 66.80 12/03/2022   Lab Results  Component Value Date   LDLCALC 71 12/03/2022   Lab Results  Component Value Date   TRIG 76.0 12/03/2022   Lab Results  Component Value Date   CHOLHDL 2 12/03/2022   Lab Results  Component Value Date   HGBA1C 5.2 12/03/2022      Assessment & Plan:  Labs: Urinalysis ordered.  Parkinson's Disease:  Referral placed to occupational therapy and physical therapy. Problem List Items Addressed This Visit       Cardiovascular and Mediastinum   Essential hypertension   PAF (paroxysmal atrial fibrillation)     Nervous and Auditory   Parkinson's disease     Other   Hyperlipidemia   Hyperglycemia - Primary   No orders of the defined types were placed in this encounter.  I, Vickey Sages, personally preformed the services described in this documentation.  All medical record entries made by the scribe were at my direction and in my presence.  I have reviewed the chart and discharge instructions (if applicable) and agree that the record reflects my personal performance and is accurate and complete. 03/11/2023  I,Mohammed Iqbal,acting as a scribe for Danise Edge, MD.,have documented all relevant documentation on the behalf of Danise Edge, MD,as directed by  Danise Edge, MD while in the presence of Danise Edge, MD.  Vickey Sages

## 2023-03-11 NOTE — Patient Instructions (Signed)
Parkinson's Disease Parkinson's disease is a movement disorder. It is a long-term condition that gets worse over time. Each person with Parkinson's disease is affected differently. This condition limits a person's ability to control movements and move the body normally. The condition can range from mild to severe. Parkinson's disease tends to get worse slowly over several years. What are the causes? Parkinson's disease is caused by a loss of brain cells (neurons) that make a brain chemical called dopamine. Dopamine is needed to control movement. As the condition gets worse, more neurons that make dopamine die. This makes it hard to move or control your movements. The exact cause of the loss of neurons is not known. Genes and the environment may contribute to the cause of Parkinson's disease. What increases the risk? The following factors may make you more likely to develop this condition: Being male. Being age 43 or older. Having a family history of Parkinson's disease. Having had a traumatic brain injury. Having been exposed to toxins, such as pesticides. Having depression. What are the signs or symptoms? Symptoms of this condition can vary. The main symptoms are related to movement. These include: A tremor or shaking while you are resting. You cannot control the shaking. Stiffness in your arms and legs (rigidity). Slowing of movement. You may lose facial expressions and have trouble making small movements that are needed to button clothing or brush your teeth. An abnormal walk. You may walk with short, shuffling steps. Loss of balance and stability when standing. You may sway, fall backward, and have trouble making turns. Other symptoms include: Mental or cognitive changes, including: Depression or anxiety. Having false beliefs (delusions). Seeing, hearing, or feeling things that do not exist (hallucinations). Trouble speaking or swallowing. Changes in bowel or bladder functions,  including constipation, having to go urgently or frequently, or not being able to control your bowel or bladder. Changes in sleep habits, acting out dreams, or trouble sleeping. Depending on the severity of the symptoms, Parkinson's disease may be mild, moderate, or advanced. Parkinson's disease progression is different for everyone. Some people may not progress to the advanced stage. Mild Parkinson's disease involves: Movement problems that do not affect daily activities. Movement problems on one side of the body. Moderate Parkinson's disease involves: Movement problems on both sides of the body. Slowing of movement. Coordination and balance problems. Advanced Parkinson's disease involves: Extreme difficulty walking. Inability to live alone safely. Signs of dementia, such as having trouble remembering things, doing daily tasks such as getting dressed, and problem solving. How is this diagnosed? This condition is diagnosed by a specialist. A diagnosis may be made based on symptoms, your medical history, and a physical exam. You may also have brain imaging tests to check for loss of neurons in the brain. How is this treated? There is no cure for Parkinson's disease. Treatment focuses on managing your symptoms. Treatment may include: Medicines. Everyone responds to medicines differently. Your response may change over time. Work with your health care provider to find the best medicines for you. Speech, occupational, and physical therapy. Deep brain stimulation surgery to reduce tremors and other involuntary movements. Follow these instructions at home: Medicines Take over-the-counter and prescription medicines only as told by your health care provider. Avoid taking medicines that can affect thinking, such as pain or sleeping medicines. Eating and drinking Follow instructions from your health care provider about eating or drinking restrictions. Do not drink alcohol. Activity Ask your health  care provider if it is safe for you  to drive. Do exercises as told by your health care provider or physical therapist. Lifestyle  Install grab bars and railings in your home to prevent falls. Do not use any products that contain nicotine or tobacco. These products include cigarettes, chewing tobacco, and vaping devices, such as e-cigarettes. If you need help quitting, ask your health care provider. Consider joining a support group for people with Parkinson's disease. General instructions Work with your health care provider to know the kind of day-to-day help that you may need and what to do to stay safe. Keep all follow-up visits. This is important. Follow-up visits include any visits with a physical therapist, speech therapist, or occupational therapist. Where to find more information [employer] of Neurological Disorders and Stroke: [employer] Parkinson's Foundation: www.parkinson.org Contact a health care provider if: Medicines do not help your symptoms. You are unsteady or have fallen at home. You need more support to function well at home. You have trouble swallowing. You have severe constipation. You are having problems with side effects from your medicines. You feel confused, anxious, depressed, or have hallucinations. Get help right away if you: Are injured after a fall. Cannot swallow without choking. Have chest pain or trouble breathing. Do not feel safe at home. Have thoughts about hurting yourself or others. These symptoms may represent a serious problem that is an emergency. Do not wait to see if the symptoms will go away. Get medical help right away. Call your local emergency services (911 in the [place] Do not drive yourself to the hospital. If you ever feel like you may hurt yourself or others, or have thoughts about taking your own life, get help right away. Go to your nearest emergency department or: Call your local emergency services (911 in the  [place] Call a suicide crisis helpline, such as the National Suicide Prevention Lifeline at 3463450740 or 988 in the [place] This is open 24 hours a day in the [place] Text the Crisis Text Line at (938)619-3943 (in the [place] Summary Parkinson's disease is a long-term condition that gets worse over time. This condition limits your ability to control your movements and move your body normally. There is no cure for Parkinson's disease. Treatment focuses on managing your symptoms. Work with your health care provider to know the kind of day-to-day help that you may need and what to do to stay safe. Keep all follow-up visits, including any visits with a physical therapist, speech therapist, or occupational therapist. This is important. This information is not intended to replace advice given to you by your health care provider. Make sure you discuss any questions you have with your health care provider. Document Revised: 04/18/2021 Document Reviewed: 01/08/2021 Elsevier Patient Education  2024 [employer].

## 2023-03-12 ENCOUNTER — Encounter: Payer: Self-pay | Admitting: Family Medicine

## 2023-03-12 LAB — URINE CULTURE
MICRO NUMBER:: 15038624
Result:: NO GROWTH
SPECIMEN QUALITY:: ADEQUATE

## 2023-03-12 NOTE — Progress Notes (Signed)
Subjective:   By signing my name below, I, Vickey Sages, attest that this documentation has been prepared under the direction and in the presence of [name], MD., 03/11/2023.   Patient ID: Walter Brown, male    DOB: Feb 11, 1941, 82 y.o.   MRN: [id number]  Chief Complaint  Patient presents with   Follow-up    Follow up   HPI Patient is in today for an office visit and is accompanied by his wife. He denies recent hospitalization, febrile illness, CP/palpitations/SOB/HA/fever/chills.   Parkinson's Disease Patient reports that his dizziness, unsteadiness, and weakness associated with Parkinson's diease is worsening, causing recurrent falls. He experiences weakness bilaterally in the thighs, making it difficult for him to stand up. His wife states that he has been ambulating with a scooter for the past 6-8 weeks, but he is interested in switching to a wheelchair and receiving occupational/physical therapy. He experiences constant constipation, but has been managing this with Senokot Plus tablets daily. He has also been experiencing occasional nausea but denies abdominal pain, blood in stool, or diarrhea. Additionally, he has been following with urologist Dr. Berniece Salines at [facility] and reports he has been taking a medication to help him urinate, which has been helpful. He denies dysuria, hematuria, frequency, or urgency.  Past Medical History:  Diagnosis Date   Allergy    Anemia    mild   Arthritis 04/06/2017   Asthma    childhood   ASVD (arteriosclerotic vascular disease) 09/07/2018   Back pain with radiation 04/26/2013   Low back with LLE radiculopathy   Basal cell carcinoma    skin- on nose- basal cell (20 yrs ago) forehead 1 year ago     Sees Dr Elmon Else of dermatology   BPH (benign prostatic hyperplasia)    Bradycardia 06/28/2018   Cataract 10/07/2014   Cerumen impaction 08/10/2012   Chicken pox as child   Coronary artery calcification 11/23/2019    Dysphagia 09/11/2021   Elevated LFTs    Essential hypertension    ETD (Eustachian tube dysfunction), right 01/17/2022   Generalized anxiety disorder    [place] measles as a child   Hemoptysis 01/17/2022   Hyperglycemia 04/25/2020   Hyperlipidemia    Insomnia 02/06/2021   Internal hemorrhoids    Irregular cardiac rhythm 03/10/2018   Lipoma 08/04/2018   Macular degeneration    Major depressive disorder 08/01/2012   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   Nocturia 12/04/2022   Obstructive sleep apnea 11/09/2018   Patient reports mild symptoms; was not prescribed a CPAP machine   Otitis externa 08/10/2012   PAF (paroxysmal atrial fibrillation)    Palpitations 09/07/2018   Parkinson's disease 05/19/2019   Recurrent falls 12/04/2022   Thrombocytopenia    TIA (transient ischemic attack) 03/10/2018   Unsteady gait 09/11/2021    Past Surgical History:  Procedure Laterality Date   BELPHAROPTOSIS REPAIR     very young, b/l   EXCISIONAL HEMORRHOIDECTOMY     EYE SURGERY  2017   eyelids   HYDROCELE EXCISION / REPAIR  2012   b/l   SKIN CANCER EXCISION     nose and forehead, basal cell CA   UPPER GASTROINTESTINAL ENDOSCOPY      Family History  Problem Relation Age of Onset   Hypertension Mother    Other Mother        MRSA   Heart disease Mother        stents   ADD / ADHD  Mother    Arthritis Mother    Asthma Mother    Depression Mother    Vision loss Mother    Heart disease Father    Hypertension Father    Prostate cancer Father 73   Parkinson's disease Father    Cancer Father    Hearing loss Father    Stroke Father    Vision loss Father    Varicose Veins Father    Stroke Maternal Grandmother    Prostate cancer Maternal Grandfather    Asthma Maternal Grandfather    Cancer Maternal Grandfather    Cancer Paternal Grandfather        EYE   Hearing loss Paternal Grandfather    ADD / ADHD Son        ADHD   Other Son 24       part of 1  lung removed- due to infection   Alcohol abuse Son    Depression Son    Alcohol abuse Son    Alcohol abuse Maternal Uncle    Depression Maternal Uncle    Asthma Maternal Uncle    Heart disease Paternal Uncle    Colon cancer Neg Hx    Esophageal cancer Neg Hx    Rectal cancer Neg Hx    Stomach cancer Neg Hx     Social History   Socioeconomic History   Marital status: Married    Spouse name: Not on file   Number of children: 2   Years of education: 16   Highest education level: Bachelor's degree (e.g., BA, AB, BS)  Occupational History   Occupation: Retired  Tobacco Use   Smoking status: Former    Packs/day: 1.50    Years: 15.00    Additional pack years: 0.00    Total pack years: 22.50    Types: Cigarettes    Start date: 10/06/1978    Quit date: 10/07/1978    Years since quitting: 44.4   Smokeless tobacco: Never   Tobacco comments:    QUIT 1980  Vaping Use   Vaping Use: Never used  Substance and Sexual Activity   Alcohol use: Yes    Alcohol/week: 7.0 standard drinks of alcohol    Types: 7 Glasses of wine per week    Comment: wine with meals   Drug use: Not Currently   Sexual activity: Yes    Comment: lives with wife, 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  Other Topics Concern   Not on file  Social History Narrative   Right handed    Occasionally caffeine   Retired   Chemical engineer Strain: Low Risk  (03/26/2022)   Overall Financial Resource Strain (CARDIA)    Difficulty of Paying Living Expenses: Not hard at all  Food Insecurity: No Food Insecurity (03/26/2022)   Hunger Vital Sign    Worried About Running Out of Food in the Last Year: Never true    Ran Out of Food in the Last Year: Never true  Transportation Needs: No Transportation Needs (03/26/2022)   PRAPARE - Administrator, Civil Service (Medical): No    Lack of Transportation (Non-Medical): No  Physical  Activity: Sufficiently Active (03/26/2022)   Exercise Vital Sign    Days of Exercise per Week: 5 days    Minutes of Exercise per Session: 50 min  Stress: Stress Concern Present (03/26/2022)   [employer] of Occupational Health - Occupational Stress Questionnaire  Feeling of Stress : To some extent  Social Connections: Moderately Isolated (03/26/2022)   Social Connection and Isolation Panel [NHANES]    Frequency of Communication with Friends and Family: More than three times a week    Frequency of Social Gatherings with Friends and Family: Once a week    Attends Religious Services: Never    Database administrator or Organizations: No    Attends Banker Meetings: Never    Marital Status: Married  Catering manager Violence: Not At Risk (03/26/2022)   Humiliation, Afraid, Rape, and Kick questionnaire    Fear of Current or Ex-Partner: No    Emotionally Abused: No    Physically Abused: No    Sexually Abused: No    Outpatient Medications Prior to Visit  Medication Sig Dispense Refill   amiodarone (PACERONE) 200 MG tablet TAKE 1/2 TABLET BY MOUTH DAILY 45 tablet 3   calcium-vitamin D (OSCAL WITH D) 500-5 MG-MCG tablet Take 1 tablet by mouth.     carbidopa-levodopa (SINEMET CR) 50-200 MG tablet TAKE 1 TABLET BY MOUTH EVERYDAY AT BEDTIME 90 tablet 0   carbidopa-levodopa (SINEMET IR) 25-100 MG tablet TAKE 1 TABLET BY MOUTH 3 (THREE) TIMES DAILY. 7AM/11AM/4PM 270 tablet 0   divalproex (DEPAKOTE) 125 MG DR tablet Take 2 tablets (250 mg total) by mouth daily. 180 tablet 0   ELIQUIS 5 MG TABS tablet TAKE 1 TABLET BY MOUTH TWICE A DAY 180 tablet 1   ezetimibe (ZETIA) 10 MG tablet TAKE 1 TABLET BY MOUTH EVERY DAY 90 tablet 3   hydrochlorothiazide (HYDRODIURIL) 25 MG tablet TAKE 1 TABLET BY MOUTH EVERY DAY 90 tablet 1   Melatonin 10 MG TABS Take 10 mg by mouth daily as needed.     mirabegron ER (MYRBETRIQ) 50 MG TB24 tablet Take 50 mg by mouth daily.     Multiple Vitamins-Minerals  (ICAPS AREDS 2 PO) Take 1 capsule by mouth in the morning and at bedtime.     pantoprazole (PROTONIX) 40 MG tablet TAKE 1 TABLET BY MOUTH EVERY DAY 90 tablet 0   sertraline (ZOLOFT) 100 MG tablet Take 1 tablet (100 mg total) by mouth daily. 90 tablet 1   No facility-administered medications prior to visit.    Allergies  Allergen Reactions   Albuterol Sulfate Palpitations   Azithromycin Other (See Comments)    Hepatotoxicity   Doxycycline Hyclate Other (See Comments)    Taken with Azithromycin and had Heaptotoxicity    Review of Systems  Constitutional:  Positive for malaise/fatigue. Negative for chills and fever.  Respiratory:  Negative for shortness of breath.   Cardiovascular:  Negative for chest pain and palpitations.  Gastrointestinal:  Positive for constipation. Negative for abdominal pain, blood in stool, diarrhea, nausea and vomiting.  Genitourinary:  Negative for dysuria, frequency, hematuria and urgency.  Musculoskeletal:  Positive for falls.  Skin:           Neurological:  Positive for dizziness and weakness. Negative for headaches.       Objective:    Physical Exam Vitals reviewed.  Constitutional:      Appearance: Normal appearance. He is not ill-appearing.  HENT:     Head: Normocephalic and atraumatic.     Nose: Nose normal.  Eyes:     Conjunctiva/sclera: Conjunctivae normal.  Cardiovascular:     Rate and Rhythm: Normal rate.     Pulses: Normal pulses.     Heart sounds: Normal heart sounds. No murmur heard. Pulmonary:  Effort: Pulmonary effort is normal.     Breath sounds: Normal breath sounds. No wheezing.  Abdominal:     Palpations: Abdomen is soft. There is no mass.     Tenderness: There is no abdominal tenderness.  Musculoskeletal:     Cervical back: Normal range of motion.     Right lower leg: No edema.     Left lower leg: No edema.  Skin:    General: Skin is warm and dry.  Neurological:     General: No focal deficit present.     Mental  Status: He is alert and oriented to person, place, and time.     Motor: Weakness present.     Gait: Gait abnormal.  Psychiatric:        Mood and Affect: Mood normal.     BP 124/72 (BP Location: Left Arm, Patient Position: Sitting, Cuff Size: Normal)   Pulse 82   Temp (!) 97.5 F (36.4 C) (Oral)   Resp 16   Ht 5\' 5"  (1.651 m)   Wt 167 lb 12.8 oz (76.1 kg)   SpO2 95%   BMI 27.92 kg/m  Wt Readings from Last 3 Encounters:  03/11/23 167 lb 12.8 oz (76.1 kg)  02/03/23 172 lb (78 kg)  12/03/22 168 lb 3.2 oz (76.3 kg)    Diabetic Foot Exam - Simple   No data filed    Lab Results  Component Value Date   WBC 7.4 12/03/2022   HGB 14.4 12/03/2022   HCT 42.4 12/03/2022   PLT 149.0 (L) 12/03/2022   GLUCOSE 69 (L) 01/01/2023   CHOL 153 12/03/2022   TRIG 76.0 12/03/2022   HDL 66.80 12/03/2022   LDLCALC 71 12/03/2022   ALT 10 12/03/2022   AST 16 12/03/2022   NA 139 01/01/2023   K 3.8 01/01/2023   CL 102 01/01/2023   CREATININE 0.97 01/01/2023   BUN 17 01/01/2023   CO2 31 01/01/2023   TSH 1.75 12/03/2022   INR 1.0 12/15/2017   HGBA1C 5.2 12/03/2022    Lab Results  Component Value Date   TSH 1.75 12/03/2022   Lab Results  Component Value Date   WBC 7.4 12/03/2022   HGB 14.4 12/03/2022   HCT 42.4 12/03/2022   MCV 96.0 12/03/2022   PLT 149.0 (L) 12/03/2022   Lab Results  Component Value Date   NA 139 01/01/2023   K 3.8 01/01/2023   CO2 31 01/01/2023   GLUCOSE 69 (L) 01/01/2023   BUN 17 01/01/2023   CREATININE 0.97 01/01/2023   BILITOT 0.7 12/03/2022   ALKPHOS 68 12/03/2022   AST 16 12/03/2022   ALT 10 12/03/2022   PROT 6.4 12/03/2022   ALBUMIN 3.9 12/03/2022   CALCIUM 9.1 01/01/2023   ANIONGAP 12 08/27/2018   GFR 73.10 01/01/2023   Lab Results  Component Value Date   CHOL 153 12/03/2022   Lab Results  Component Value Date   HDL 66.80 12/03/2022   Lab Results  Component Value Date   LDLCALC 71 12/03/2022   Lab Results  Component Value Date    TRIG 76.0 12/03/2022   Lab Results  Component Value Date   CHOLHDL 2 12/03/2022   Lab Results  Component Value Date   HGBA1C 5.2 12/03/2022      Assessment & Plan:  Labs: Urinalysis ordered.  Parkinson's Disease: Referral placed to occupational therapy and physical therapy. Problem List Items Addressed This Visit     Unsteady gait   Relevant Orders  Ambulatory referral to Home Health   Parkinson's disease    Following with Neurology but he notes his disease is progressing and he is getting weaker and weaker. His wife is with him and they note ADLs are getting difficult and he is falling more. Discussed him getting a [employer] light weight electric wheelchair to help with activities and he is a good candidate. They will contact the company and we can proceed. For now have set him up with home health for PT and OT to work on strength and safety in the home.       Relevant Orders   Ambulatory referral to Home Health   PAF (paroxysmal atrial fibrillation)    Doing well on Amiodarone      Hyperlipidemia    Tolerating statin, encouraged heart healthy diet, avoid trans fats, minimize simple carbs and saturated fats. Increase exercise as tolerated      Hyperglycemia - Primary    hgba1c acceptable, minimize simple carbs. Increase exercise as tolerated.       Essential hypertension    Well controlled, no changes to meds. Encouraged heart healthy diet such as the DASH diet and exercise as tolerated.        Other Visit Diagnoses     Urinary frequency       Relevant Orders   Urinalysis, Routine w reflex microscopic (Completed)   Urine Culture      No orders of the defined types were placed in this encounter.  I, Danise Edge, MD, personally preformed the services described in this documentation.  All medical record entries made by the scribe were at my direction and in my presence.  I have reviewed the chart and discharge instructions (if applicable) and agree that the  record reflects my personal performance and is accurate and complete. 03/11/2023  I,Mohammed Iqbal,acting as a scribe for Danise Edge, MD.,have documented all relevant documentation on the behalf of Danise Edge, MD,as directed by  Danise Edge, MD while in the presence of Danise Edge, MD.  Danise Edge, MD

## 2023-03-14 ENCOUNTER — Telehealth: Payer: Self-pay | Admitting: Family Medicine

## 2023-03-14 NOTE — Telephone Encounter (Signed)
Pt's wife called to advise that [facility] hasn't received wheelchair order. Phone # [phone] // fax [phone]

## 2023-03-14 NOTE — Telephone Encounter (Signed)
Called pt was advised it was sent 4:19 yesterday and  I resent today. We received conformation.

## 2023-03-15 DIAGNOSIS — R351 Nocturia: Secondary | ICD-10-CM | POA: Diagnosis not present

## 2023-03-15 DIAGNOSIS — N401 Enlarged prostate with lower urinary tract symptoms: Secondary | ICD-10-CM | POA: Diagnosis not present

## 2023-03-15 DIAGNOSIS — F411 Generalized anxiety disorder: Secondary | ICD-10-CM | POA: Diagnosis not present

## 2023-03-15 DIAGNOSIS — Z7901 Long term (current) use of anticoagulants: Secondary | ICD-10-CM | POA: Diagnosis not present

## 2023-03-15 DIAGNOSIS — R350 Frequency of micturition: Secondary | ICD-10-CM | POA: Diagnosis not present

## 2023-03-15 DIAGNOSIS — M545 Low back pain, unspecified: Secondary | ICD-10-CM | POA: Diagnosis not present

## 2023-03-15 DIAGNOSIS — J45909 Unspecified asthma, uncomplicated: Secondary | ICD-10-CM | POA: Diagnosis not present

## 2023-03-15 DIAGNOSIS — I2510 Atherosclerotic heart disease of native coronary artery without angina pectoris: Secondary | ICD-10-CM | POA: Diagnosis not present

## 2023-03-15 DIAGNOSIS — G4733 Obstructive sleep apnea (adult) (pediatric): Secondary | ICD-10-CM | POA: Diagnosis not present

## 2023-03-15 DIAGNOSIS — M1990 Unspecified osteoarthritis, unspecified site: Secondary | ICD-10-CM | POA: Diagnosis not present

## 2023-03-15 DIAGNOSIS — D649 Anemia, unspecified: Secondary | ICD-10-CM | POA: Diagnosis not present

## 2023-03-15 DIAGNOSIS — F339 Major depressive disorder, recurrent, unspecified: Secondary | ICD-10-CM | POA: Diagnosis not present

## 2023-03-15 DIAGNOSIS — H6981 Other specified disorders of Eustachian tube, right ear: Secondary | ICD-10-CM | POA: Diagnosis not present

## 2023-03-15 DIAGNOSIS — R1310 Dysphagia, unspecified: Secondary | ICD-10-CM | POA: Diagnosis not present

## 2023-03-15 DIAGNOSIS — K219 Gastro-esophageal reflux disease without esophagitis: Secondary | ICD-10-CM | POA: Diagnosis not present

## 2023-03-15 DIAGNOSIS — I480 Paroxysmal atrial fibrillation: Secondary | ICD-10-CM | POA: Diagnosis not present

## 2023-03-15 DIAGNOSIS — K5900 Constipation, unspecified: Secondary | ICD-10-CM | POA: Diagnosis not present

## 2023-03-15 DIAGNOSIS — G8929 Other chronic pain: Secondary | ICD-10-CM | POA: Diagnosis not present

## 2023-03-15 DIAGNOSIS — G3184 Mild cognitive impairment, so stated: Secondary | ICD-10-CM | POA: Diagnosis not present

## 2023-03-15 DIAGNOSIS — H9190 Unspecified hearing loss, unspecified ear: Secondary | ICD-10-CM | POA: Diagnosis not present

## 2023-03-15 DIAGNOSIS — H3530 Unspecified macular degeneration: Secondary | ICD-10-CM | POA: Diagnosis not present

## 2023-03-15 DIAGNOSIS — E782 Mixed hyperlipidemia: Secondary | ICD-10-CM | POA: Diagnosis not present

## 2023-03-15 DIAGNOSIS — G20C Parkinsonism, unspecified: Secondary | ICD-10-CM | POA: Diagnosis not present

## 2023-03-15 DIAGNOSIS — I10 Essential (primary) hypertension: Secondary | ICD-10-CM | POA: Diagnosis not present

## 2023-03-17 ENCOUNTER — Other Ambulatory Visit: Payer: Self-pay

## 2023-03-17 DIAGNOSIS — R2681 Unsteadiness on feet: Secondary | ICD-10-CM

## 2023-03-17 DIAGNOSIS — G20A1 Parkinson's disease without dyskinesia, without mention of fluctuations: Secondary | ICD-10-CM

## 2023-03-17 NOTE — Telephone Encounter (Signed)
Pt was advised a new order will be sent to [facility]

## 2023-03-17 NOTE — Telephone Encounter (Signed)
Pt called stating that [facility] did not receive the supporting documentation for the wheelchair and needed it sent over when possible. Pt also wanted to confirm that the order was for a power wheelchair.

## 2023-03-17 NOTE — Telephone Encounter (Signed)
Called pt and [facility] supply, Pt stated he needing a Scientific laboratory technician a new DME order

## 2023-03-22 ENCOUNTER — Other Ambulatory Visit: Payer: Self-pay | Admitting: Neurology

## 2023-03-22 DIAGNOSIS — G20A1 Parkinson's disease without dyskinesia, without mention of fluctuations: Secondary | ICD-10-CM

## 2023-04-01 ENCOUNTER — Ambulatory Visit (INDEPENDENT_AMBULATORY_CARE_PROVIDER_SITE_OTHER): Payer: PPO | Admitting: *Deleted

## 2023-04-01 DIAGNOSIS — N401 Enlarged prostate with lower urinary tract symptoms: Secondary | ICD-10-CM | POA: Diagnosis not present

## 2023-04-01 DIAGNOSIS — Z0000 Encounter for general adult medical examination without abnormal findings: Secondary | ICD-10-CM

## 2023-04-01 DIAGNOSIS — R3915 Urgency of urination: Secondary | ICD-10-CM | POA: Diagnosis not present

## 2023-04-01 NOTE — Progress Notes (Signed)
Subjective:   Walter Brown is a 82 y.o. male who presents for Medicare Annual/Subsequent preventive examination.  Visit Complete: Virtual  I connected with  Walter Brown on 04/01/23 by a audio enabled telemedicine application and verified that I am speaking with the correct person using two identifiers.  Patient Location: Home  Provider Location: Office/Clinic  I discussed the limitations of evaluation and management by telemedicine. The patient expressed understanding and agreed to proceed.   Review of Systems     Cardiac Risk Factors include: advanced age (>52men, >45 women);dyslipidemia;hypertension     Objective:    Today's Vitals   There is no height or weight on file to calculate BMI.     04/01/2023    1:01 PM 11/14/2022   12:55 PM 10/31/2022   10:03 AM 09/29/2022    4:22 PM 08/13/2022    8:15 AM 05/07/2022   12:53 PM 03/26/2022    2:01 PM  Advanced Directives  Does Patient Have a Medical Advance Directive? Yes Yes Yes Yes Yes Yes Yes  Type of Estate agent of [place];Living will Living will    Healthcare Power of [place];Living will;Out of facility DNR (pink MOST or yellow form) Healthcare Power of [place];Living will  Does patient want to make changes to medical advance directive? No - Patient declined   No - Patient declined     Copy of Healthcare Power of Attorney in Chart? No - copy requested      No - copy requested    Current Medications (verified) Outpatient Encounter Medications as of 04/01/2023  Medication Sig   amiodarone (PACERONE) 200 MG tablet TAKE 1/2 TABLET BY MOUTH DAILY   calcium-vitamin D (OSCAL WITH D) 500-5 MG-MCG tablet Take 1 tablet by mouth.   carbidopa-levodopa (SINEMET CR) 50-200 MG tablet TAKE 1 TABLET BY MOUTH EVERYDAY AT BEDTIME   carbidopa-levodopa (SINEMET IR) 25-100 MG tablet TAKE 1 TABLET BY MOUTH 3 (THREE) TIMES DAILY. 7AM/11AM/4PM   divalproex (DEPAKOTE) 125 MG DR tablet Take 2 tablets (250 mg total) by  mouth daily.   ELIQUIS 5 MG TABS tablet TAKE 1 TABLET BY MOUTH TWICE A DAY   ezetimibe (ZETIA) 10 MG tablet TAKE 1 TABLET BY MOUTH EVERY DAY   hydrochlorothiazide (HYDRODIURIL) 25 MG tablet TAKE 1 TABLET BY MOUTH EVERY DAY   Melatonin 10 MG TABS Take 10 mg by mouth daily as needed.   mirabegron ER (MYRBETRIQ) 50 MG TB24 tablet Take 50 mg by mouth daily.   Multiple Vitamins-Minerals (ICAPS AREDS 2 PO) Take 1 capsule by mouth in the morning and at bedtime.   pantoprazole (PROTONIX) 40 MG tablet TAKE 1 TABLET BY MOUTH EVERY DAY   sertraline (ZOLOFT) 100 MG tablet Take 1 tablet (100 mg total) by mouth daily.   No facility-administered encounter medications on file as of 04/01/2023.    Allergies (verified) Albuterol sulfate, Azithromycin, and Doxycycline hyclate   History: Past Medical History:  Diagnosis Date   Allergy    Anemia    mild   Arthritis 04/06/2017   Asthma    childhood   ASVD (arteriosclerotic vascular disease) 09/07/2018   Back pain with radiation 04/26/2013   Low back with LLE radiculopathy   Basal cell carcinoma    skin- on nose- basal cell (20 yrs ago) forehead 1 year ago     Sees Dr Elmon Else of dermatology   BPH (benign prostatic hyperplasia)    Bradycardia 06/28/2018   Cataract 10/07/2014   Cerumen impaction 08/10/2012  Chicken pox as child   Coronary artery calcification 11/23/2019   Dysphagia 09/11/2021   Elevated LFTs    Essential hypertension    ETD (Eustachian tube dysfunction), right 01/17/2022   Generalized anxiety disorder    German measles as a child   Hemoptysis 01/17/2022   Hyperglycemia 04/25/2020   Hyperlipidemia    Insomnia 02/06/2021   Internal hemorrhoids    Irregular cardiac rhythm 03/10/2018   Lipoma 08/04/2018   Macular degeneration    Major depressive disorder 08/01/2012   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   Nocturia 12/04/2022   Obstructive sleep apnea 11/09/2018   Patient reports mild  symptoms; was not prescribed a CPAP machine   Otitis externa 08/10/2012   PAF (paroxysmal atrial fibrillation)    Palpitations 09/07/2018   Parkinson's disease 05/19/2019   Recurrent falls 12/04/2022   Thrombocytopenia    TIA (transient ischemic attack) 03/10/2018   Unsteady gait 09/11/2021   Past Surgical History:  Procedure Laterality Date   BELPHAROPTOSIS REPAIR     very young, b/l   EXCISIONAL HEMORRHOIDECTOMY     EYE SURGERY  2017   eyelids   HYDROCELE EXCISION / REPAIR  2012   b/l   SKIN CANCER EXCISION     nose and forehead, basal cell CA   UPPER GASTROINTESTINAL ENDOSCOPY     Family History  Problem Relation Age of Onset   Hypertension Mother    Other Mother        MRSA   Heart disease Mother        stents   ADD / ADHD Mother    Arthritis Mother    Asthma Mother    Depression Mother    Vision loss Mother    Heart disease Father    Hypertension Father    Prostate cancer Father 44   Parkinson's disease Father    Cancer Father    Hearing loss Father    Stroke Father    Vision loss Father    Varicose Veins Father    Stroke Maternal Grandmother    Prostate cancer Maternal Grandfather    Asthma Maternal Grandfather    Cancer Maternal Grandfather    Cancer Paternal Grandfather        EYE   Hearing loss Paternal Grandfather    ADD / ADHD Son        ADHD   Other Son 24       part of 1 lung removed- due to infection   Alcohol abuse Son    Depression Son    Alcohol abuse Son    Alcohol abuse Maternal Uncle    Depression Maternal Uncle    Asthma Maternal Uncle    Heart disease Paternal Uncle    Colon cancer Neg Hx    Esophageal cancer Neg Hx    Rectal cancer Neg Hx    Stomach cancer Neg Hx    Social History   Socioeconomic History   Marital status: Married    Spouse name: Not on file   Number of children: 2   Years of education: 16   Highest education level: Bachelor's degree (e.g., BA, AB, BS)  Occupational History   Occupation: Retired   Tobacco Use   Smoking status: Former    Packs/day: 1.50    Years: 15.00    Additional pack years: 0.00    Total pack years: 22.50    Types: Cigarettes    Start date: 10/06/1978    Quit date: 10/07/1978  Years since quitting: 44.5   Smokeless tobacco: Never   Tobacco comments:    QUIT 1980  Vaping Use   Vaping Use: Never used  Substance and Sexual Activity   Alcohol use: Yes    Alcohol/week: 7.0 standard drinks of alcohol    Types: 7 Glasses of wine per week    Comment: wine with meals   Drug use: Not Currently   Sexual activity: Yes    Comment: lives with wife, 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  Other Topics Concern   Not on file  Social History Narrative   Right handed    Occasionally caffeine   Retired   Chemical engineer Strain: Low Risk  (03/26/2022)   Overall Financial Resource Strain (CARDIA)    Difficulty of Paying Living Expenses: Not hard at all  Food Insecurity: No Food Insecurity (04/01/2023)   Hunger Vital Sign    Worried About Running Out of Food in the Last Year: Never true    Ran Out of Food in the Last Year: Never true  Transportation Needs: No Transportation Needs (04/01/2023)   PRAPARE - Administrator, Civil Service (Medical): No    Lack of Transportation (Non-Medical): No  Physical Activity: Sufficiently Active (03/26/2022)   Exercise Vital Sign    Days of Exercise per Week: 5 days    Minutes of Exercise per Session: 50 min  Stress: Stress Concern Present (03/26/2022)   [employer] of Occupational Health - Occupational Stress Questionnaire    Feeling of Stress : To some extent  Social Connections: Moderately Isolated (03/26/2022)   Social Connection and Isolation Panel [NHANES]    Frequency of Communication with Friends and Family: More than three times a week    Frequency of Social Gatherings with Friends and Family: Once a week    Attends  Religious Services: Never    Database administrator or Organizations: No    Attends Engineer, structural: Never    Marital Status: Married    Tobacco Counseling Counseling given: Not Answered Tobacco comments: QUIT 1980   Clinical Intake:  Pre-visit preparation completed: Yes  Pain : No/denies pain  Nutritional Risks: None Diabetes: No  How often do you need to have someone help you when you read instructions, pamphlets, or other written materials from your doctor or pharmacy?: 1 - Never  Interpreter Needed?: No  Information entered by :: [employer], CMA   Activities of Daily Living    04/01/2023    1:04 PM  In your present state of health, do you have any difficulty performing the following activities:  Hearing? 1  Comment wears hearing aids  Vision? 0  Comment wears readers  Difficulty concentrating or making decisions? 1  Walking or climbing stairs? 0  Dressing or bathing? 0  Doing errands, shopping? 0  Preparing Food and eating ? Y  Comment preparing meals  Using the Toilet? N  In the past six months, have you accidently leaked urine? Y  Do you have problems with loss of bowel control? N  Managing your Medications? N  Managing your Finances? N  Housekeeping or managing your Housekeeping? Y  Comment wife assists    Patient Care Team: [name], MD as PCP - General (Family Medicine) Runell Gess, MD as PCP - Cardiology (Cardiology) [place], Elvin So, MD as Referring Physician (Dermatology) Maris Berger, MD as Consulting Physician (Ophthalmology) Crist Fat,  MD as Consulting Physician (Urology) Tat, Octaviano Batty, DO as Consulting Physician (Neurology)  Indicate any recent Medical Services you may have received from other than [facility] providers in the past year (date may be approximate).     Assessment:   This is a routine wellness examination for [place].  Hearing/Vision screen No results found.  Dietary issues and  exercise activities discussed:     Goals Addressed   None    Depression Screen    04/01/2023    1:09 PM 03/11/2023   11:30 AM 12/03/2022   10:17 AM 05/30/2022   10:44 AM 03/26/2022    2:00 PM 01/17/2022   11:26 AM 09/11/2021   11:51 AM  PHQ 2/9 Scores  PHQ - 2 Score 0 0 1 1 1 2 2   PHQ- 9 Score  0 4 5 5 6 6     Fall Risk    04/01/2023    1:03 PM 03/11/2023   11:30 AM 12/03/2022   10:17 AM 11/14/2022   12:55 PM 10/31/2022   10:02 AM  Fall Risk   Falls in the past year? 1 1  1 1   Number falls in past yr: 1 0 1 1 1   Injury with Fall? 0 0 1 0 1  Risk for fall due to : Impaired balance/gait      Follow up Falls evaluation completed Falls evaluation completed Falls evaluation completed Falls evaluation completed Falls evaluation completed    MEDICARE RISK AT HOME:  Medicare Risk at Home - 04/01/23 1307     Any stairs in or around the home? No   has ramp for back door   If so, are there any without handrails? No    Home free of loose throw rugs in walkways, pet beds, electrical cords, etc? Yes    Adequate lighting in your home to reduce risk of falls? Yes    Life alert? No    Use of a cane, walker or w/c? Yes    Grab bars in the bathroom? Yes    Shower chair or bench in shower? Yes    Elevated toilet seat or a handicapped toilet? No             TIMED UP AND GO:  Was the test performed?  No    Cognitive Function:    03/05/2016   10:01 AM  MMSE - Mini Mental State Exam  Orientation to time 5  Orientation to Place 5  Registration 3  Attention/ Calculation 5  Recall 3  Language- name 2 objects 2  Language- repeat 1  Language- follow 3 step command 3  Language- read & follow direction 1  Write a sentence 1  Copy design 1  Total score 30      08/13/2022    8:00 AM  Montreal Cognitive Assessment   Visuospatial/ Executive (0/5) 4  Naming (0/3) 3  Attention: Read list of digits (0/2) 2  Attention: Read list of letters (0/1) 1  Attention: Serial 7 subtraction  starting at 100 (0/3) 1  Language: Repeat phrase (0/2) 1  Language : Fluency (0/1) 1  Abstraction (0/2) 2  Delayed Recall (0/5) 3  Orientation (0/6) 6  Total 24  Adjusted Score (based on education) 24      04/01/2023    1:10 PM 03/26/2022    2:05 PM 10/11/2019    9:19 AM  6CIT Screen  What Year? 0 points 0 points 0 points  What month? 0 points 0 points 0 points  What time? 3 points 0 points 0 points  Count back from 20 0 points 0 points 0 points  Months in reverse 0 points 2 points 0 points  Repeat phrase 4 points 4 points 2 points  Total Score 7 points 6 points 2 points    Immunizations Immunization History  Administered Date(s) Administered   Fluad Quad(high Dose 65+) 06/07/2022   Influenza Split 07/02/2012   Influenza Whole 06/07/2013   Influenza, High Dose Seasonal PF 07/08/2017, 05/19/2019, 06/22/2020, 06/18/2021   Influenza,inj,Quad PF,6+ Mos 06/21/2014   Influenza-Unspecified 06/07/2016, 07/08/2017, 05/26/2018   PFIZER Comirnaty(Gray Top)Covid-19 Tri-Sucrose Vaccine 01/09/2021   PFIZER(Purple Top)SARS-COV-2 Vaccination 10/20/2019, 11/10/2019, 06/29/2020   Pfizer Covid-19 Vaccine Bivalent Booster 30yrs & up 06/18/2021, 02/12/2022   Pneumococcal Conjugate-13 11/04/2013   Pneumococcal Polysaccharide-23 10/08/2007, 08/04/2020   Respiratory Syncytial Virus Vaccine,Recomb Aduvanted(Arexvy) 06/07/2022   Tdap 11/04/2013   Unspecified SARS-COV-2 Vaccination 07/16/2022   Zoster Recombinat (Shingrix) 05/26/2018, 11/05/2018   Zoster, Live 10/07/1998    TDAP status: Up to date  Flu Vaccine status: Up to date  Pneumococcal vaccine status: Up to date  Covid-19 vaccine status: Information provided on how to obtain vaccines.   Qualifies for Shingles Vaccine? Yes   Zostavax completed Yes   Shingrix Completed?: Yes  Screening Tests Health Maintenance  Topic Date Due   Medicare Annual Wellness (AWV)  03/27/2023   COVID-19 Vaccine (8 - 2023-24 season) 05/02/2023 (Originally  09/10/2022)   INFLUENZA VACCINE  05/08/2023   DTaP/Tdap/Td (2 - Td or Tdap) 11/05/2023   Pneumonia Vaccine 54+ Years old  Completed   Zoster Vaccines- Shingrix  Completed   HPV VACCINES  Aged Out   Hepatitis C Screening  Discontinued    Health Maintenance  Health Maintenance Due  Topic Date Due   Medicare Annual Wellness (AWV)  03/27/2023    Colorectal cancer screening: No longer required.   Lung Cancer Screening: (Low Dose CT Chest recommended if Age 59-80 years, 20 pack-year currently smoking OR have quit w/in 15years.) does not qualify.   Additional Screening:  Hepatitis C Screening: does qualify; Completed 11/20/17  Vision Screening: Recommended annual ophthalmology exams for early detection of glaucoma and other disorders of the eye. Is the patient up to date with their annual eye exam?  Yes  Who is the provider or what is the name of the office in which the patient attends annual eye exams? Dr. Larry Sierras (retina specialist) If pt is not established with a provider, would they like to be referred to a provider to establish care? No .   Dental Screening: Recommended annual dental exams for proper oral hygiene  Diabetic Foot Exam: N/a  Community Resource Referral / Chronic Care Management: CRR required this visit?  No   CCM required this visit?  No     Plan:     I have personally reviewed and noted the following in the patient's chart:   Medical and social history Use of alcohol, tobacco or illicit drugs  Current medications and supplements including opioid prescriptions. Patient is not currently taking opioid prescriptions. Functional ability and status Nutritional status Physical activity Advanced directives List of other physicians Hospitalizations, surgeries, and ER visits in previous 12 months Vitals Screenings to include cognitive, depression, and falls Referrals and appointments  In addition, I have reviewed and discussed with patient certain preventive  protocols, quality metrics, and best practice recommendations. A written personalized care plan for preventive services as well as general preventive health recommendations were provided to patient.  Donne Anon, CMA   04/01/2023   After Visit Summary: (MyChart) Due to this being a telephonic visit, the after visit summary with patients personalized plan was offered to patient via MyChart   Nurse Notes: None

## 2023-04-01 NOTE — Patient Instructions (Signed)
[name] , Thank you for taking time to come for your Medicare Wellness Visit. I appreciate your ongoing commitment to your health goals. Please review the following plan we discussed and let me know if I can assist you in the future.     This is a list of the screening recommended for you and due dates:  Health Maintenance  Topic Date Due   COVID-19 Vaccine (8 - 2023-24 season) 05/02/2023*   Flu Shot  05/08/2023   DTaP/Tdap/Td vaccine (2 - Td or Tdap) 11/05/2023   Medicare Annual Wellness Visit  03/31/2024   Pneumonia Vaccine  Completed   Zoster (Shingles) Vaccine  Completed   HPV Vaccine  Aged Out   Hepatitis C Screening  Discontinued  *Topic was postponed. The date shown is not the original due date.    Next appointment: Follow up in one year for your annual wellness visit.   Preventive Care 42 Years and Older, Male Preventive care refers to lifestyle choices and visits with your health care provider that can promote health and wellness. What does preventive care include? A yearly physical exam. This is also called an annual well check. Dental exams once or twice a year. Routine eye exams. Ask your health care provider how often you should have your eyes checked. Personal lifestyle choices, including: Daily care of your teeth and gums. Regular physical activity. Eating a healthy diet. Avoiding tobacco and drug use. Limiting alcohol use. Practicing safe sex. Taking low doses of aspirin every day. Taking vitamin and mineral supplements as recommended by your health care provider. What happens during an annual well check? The services and screenings done by your health care provider during your annual well check will depend on your age, overall health, lifestyle risk factors, and family history of disease. Counseling  Your health care provider may ask you questions about your: Alcohol use. Tobacco use. Drug use. Emotional well-being. Home and relationship  well-being. Sexual activity. Eating habits. History of falls. Memory and ability to understand (cognition). Work and work Astronomer. Screening  You may have the following tests or measurements: Height, weight, and BMI. Blood pressure. Lipid and cholesterol levels. These may be checked every 5 years, or more frequently if you are over 87 years old. Skin check. Lung cancer screening. You may have this screening every year starting at age 49 if you have a 30-pack-year history of smoking and currently smoke or have quit within the past 15 years. Fecal occult blood test (FOBT) of the stool. You may have this test every year starting at age 68. Flexible sigmoidoscopy or colonoscopy. You may have a sigmoidoscopy every 5 years or a colonoscopy every 10 years starting at age 42. Prostate cancer screening. Recommendations will vary depending on your family history and other risks. Hepatitis C blood test. Hepatitis B blood test. Sexually transmitted disease (STD) testing. Diabetes screening. This is done by checking your blood sugar (glucose) after you have not eaten for a while (fasting). You may have this done every 1-3 years. Abdominal aortic aneurysm (AAA) screening. You may need this if you are a current or former smoker. Osteoporosis. You may be screened starting at age 39 if you are at high risk. Talk with your health care provider about your test results, treatment options, and if necessary, the need for more tests. Vaccines  Your health care provider may recommend certain vaccines, such as: Influenza vaccine. This is recommended every year. Tetanus, diphtheria, and acellular pertussis (Tdap, Td) vaccine. You may need  a Td booster every 10 years. Zoster vaccine. You may need this after age 25. Pneumococcal 13-valent conjugate (PCV13) vaccine. One dose is recommended after age 44. Pneumococcal polysaccharide (PPSV23) vaccine. One dose is recommended after age 48. Talk to your health care  provider about which screenings and vaccines you need and how often you need them. This information is not intended to replace advice given to you by your health care provider. Make sure you discuss any questions you have with your health care provider. Document Released: 10/20/2015 Document Revised: 06/12/2016 Document Reviewed: 07/25/2015 Elsevier Interactive Patient Education  2017 [employer] Prevention in the Home Falls can cause injuries. They can happen to people of all ages. There are many things you can do to make your home safe and to help prevent falls. What can I do on the outside of my home? Regularly fix the edges of walkways and driveways and fix any cracks. Remove anything that might make you trip as you walk through a door, such as a raised step or threshold. Trim any bushes or trees on the path to your home. Use bright outdoor lighting. Clear any walking paths of anything that might make someone trip, such as rocks or tools. Regularly check to see if handrails are loose or broken. Make sure that both sides of any steps have handrails. Any raised decks and porches should have guardrails on the edges. Have any leaves, snow, or ice cleared regularly. Use sand or salt on walking paths during winter. Clean up any spills in your garage right away. This includes oil or grease spills. What can I do in the bathroom? Use night lights. Install grab bars by the toilet and in the tub and shower. Do not use towel bars as grab bars. Use non-skid mats or decals in the tub or shower. If you need to sit down in the shower, use a plastic, non-slip stool. Keep the floor dry. Clean up any water that spills on the floor as soon as it happens. Remove soap buildup in the tub or shower regularly. Attach bath mats securely with double-sided non-slip rug tape. Do not have throw rugs and other things on the floor that can make you trip. What can I do in the bedroom? Use night lights. Make  sure that you have a light by your bed that is easy to reach. Do not use any sheets or blankets that are too big for your bed. They should not hang down onto the floor. Have a firm chair that has side arms. You can use this for support while you get dressed. Do not have throw rugs and other things on the floor that can make you trip. What can I do in the kitchen? Clean up any spills right away. Avoid walking on wet floors. Keep items that you use a lot in easy-to-reach places. If you need to reach something above you, use a strong step stool that has a grab bar. Keep electrical cords out of the way. Do not use floor polish or wax that makes floors slippery. If you must use wax, use non-skid floor wax. Do not have throw rugs and other things on the floor that can make you trip. What can I do with my stairs? Do not leave any items on the stairs. Make sure that there are handrails on both sides of the stairs and use them. Fix handrails that are broken or loose. Make sure that handrails are as long as the stairways. Check  any carpeting to make sure that it is firmly attached to the stairs. Fix any carpet that is loose or worn. Avoid having throw rugs at the top or bottom of the stairs. If you do have throw rugs, attach them to the floor with carpet tape. Make sure that you have a light switch at the top of the stairs and the bottom of the stairs. If you do not have them, ask someone to add them for you. What else can I do to help prevent falls? Wear shoes that: Do not have high heels. Have rubber bottoms. Are comfortable and fit you well. Are closed at the toe. Do not wear sandals. If you use a stepladder: Make sure that it is fully opened. Do not climb a closed stepladder. Make sure that both sides of the stepladder are locked into place. Ask someone to hold it for you, if possible. Clearly mark and make sure that you can see: Any grab bars or handrails. First and last steps. Where the  edge of each step is. Use tools that help you move around (mobility aids) if they are needed. These include: Canes. Walkers. Scooters. Crutches. Turn on the lights when you go into a dark area. Replace any light bulbs as soon as they burn out. Set up your furniture so you have a clear path. Avoid moving your furniture around. If any of your floors are uneven, fix them. If there are any pets around you, be aware of where they are. Review your medicines with your doctor. Some medicines can make you feel dizzy. This can increase your chance of falling. Ask your doctor what other things that you can do to help prevent falls. This information is not intended to replace advice given to you by your health care provider. Make sure you discuss any questions you have with your health care provider. Document Released: 07/20/2009 Document Revised: 02/29/2016 Document Reviewed: 10/28/2014 Elsevier Interactive Patient Education  2017 [employer].

## 2023-04-07 DIAGNOSIS — I639 Cerebral infarction, unspecified: Secondary | ICD-10-CM

## 2023-04-07 HISTORY — DX: Cerebral infarction, unspecified: I63.9

## 2023-04-15 NOTE — Progress Notes (Unsigned)
Assessment/Plan:   1.  Parkinsons Disease  -DaT scan abnormal in the past  -continue carbidopa/levodopa 25/100 1 tablet three times per day.  Can take an extra prn  -continue carbidopa/levodopa 50/200 at bed   -Patient has second opinion coming up at [facility].  Referral was just placed on October 10, 2022.  They report that they are going in June 2024.    -U step walker was discussed and he trialed it in the office.  -asks about dbs, but after some discussion about risks and benefits, I told him that I feel like benefits outweigh risks.  In addition, we discussed that DBS does not do better than medication, when medication is working its best.  I see no evidence of wearing off or dyskinesia in this patient.  -Patient/family asked about patient's weakness, but he really was not weak.  Discussed differences between weakness and coordination.  Discussed importance of exercise and physical therapy.  2.  Memory change  -Patient is scheduled for neurocognitive testing in August.  -I am not sure if all of the behavioral changes are really related to Parkinsons disease.  He actually was quite accurate on his history today.  It sounds like he may have long-term behavioral issues that may have started to increase.  It was difficult to tell, as they do not want to discuss this in front of the patient.  He is currently on Depakote, which was just increased to 125 mg twice daily.  3.  Depression  -Primary care prescribing sertraline, 100 mg daily.    4.  Bradycardia  -Following with cardiology.  It is much better today than it was last visit, when his heart rate was in the mid 40s.  5.  Urinary incontinenece  -They asked me about botox for the bladder, and I told them that they needed to follow-up with [facility].  Subjective:   Walter Brown was seen today in follow up for Parkinsons disease.  My previous records were reviewed prior to todays visit as well as outside records available to me.   Son and wife accompanies the patient and supplements the history.  Patient has been to [facility] neurology at the movement disorder clinic there for another opinion since last visit.  He went on Feb 07, 2023.  They increased his levodopa from 1 tablet of levodopa 3 times per day to 1.5 tablets each time.  Current prescribed movement disorder medications: Carbidopa/levodopa 25/100, 1.5 tablet 3 times per day (increased by [facility] neurology in May, 2024) Carbidopa/levodopa 50/200 at bedtime   ALLERGIES:   Allergies  Allergen Reactions   Albuterol Sulfate Palpitations   Azithromycin Other (See Comments)    Hepatotoxicity   Doxycycline Hyclate Other (See Comments)    Taken with Azithromycin and had Heaptotoxicity    CURRENT MEDICATIONS:  Outpatient Encounter Medications as of 04/17/2023  Medication Sig   amiodarone (PACERONE) 200 MG tablet TAKE 1/2 TABLET BY MOUTH DAILY   calcium-vitamin D (OSCAL WITH D) 500-5 MG-MCG tablet Take 1 tablet by mouth.   carbidopa-levodopa (SINEMET CR) 50-200 MG tablet TAKE 1 TABLET BY MOUTH EVERYDAY AT BEDTIME   carbidopa-levodopa (SINEMET IR) 25-100 MG tablet TAKE 1 TABLET BY MOUTH 3 (THREE) TIMES DAILY. 7AM/11AM/4PM   divalproex (DEPAKOTE) 125 MG DR tablet Take 2 tablets (250 mg total) by mouth daily.   ELIQUIS 5 MG TABS tablet TAKE 1 TABLET BY MOUTH TWICE A DAY   ezetimibe (ZETIA) 10 MG tablet TAKE 1 TABLET BY MOUTH EVERY  DAY   hydrochlorothiazide (HYDRODIURIL) 25 MG tablet TAKE 1 TABLET BY MOUTH EVERY DAY   Melatonin 10 MG TABS Take 10 mg by mouth daily as needed.   mirabegron ER (MYRBETRIQ) 50 MG TB24 tablet Take 50 mg by mouth daily.   Multiple Vitamins-Minerals (ICAPS AREDS 2 PO) Take 1 capsule by mouth in the morning and at bedtime.   pantoprazole (PROTONIX) 40 MG tablet TAKE 1 TABLET BY MOUTH EVERY DAY   sertraline (ZOLOFT) 100 MG tablet Take 1 tablet (100 mg total) by mouth daily.   No facility-administered encounter medications on file as of 04/17/2023.     Objective:   PHYSICAL EXAMINATION:    VITALS:   There were no vitals filed for this visit.    GEN:  The patient appears stated age and is in NAD. HEENT:  Normocephalic, atraumatic.  The mucous membranes are moist.  Neurological examination:  Orientation: The patient is alert and oriented x3.  He asks appropriate questions.  He makes jokes about "Claretha Cooper" that are relevant and appropriate.  He is also very accurate when he talks about his medications and the timing of them in relationship to protein. Cranial nerves: There is good facial symmetry with facial hypomimia. The speech is fluent and clear. Soft palate rises symmetrically and there is no tongue deviation. Hearing is intact to conversational tone. Sensation: Sensation is intact to light touch throughout Motor: Strength is at least antigravity x4.  Movement examination: Tone: There is normal tone today in the upper and lower extremities. Abnormal movements: none Coordination:  There is minimal decremation with hand opening and closing on the right. Gait and Station: The patient arises without the use of his hands.  He is given his rollator.  He has some initial stutter steps, but then is able to ambulate fairly well in the hallway.  I have reviewed and interpreted the following labs independently    Chemistry      Component Value Date/Time   NA 139 01/01/2023 1151   NA 142 04/05/2020 0849   K 3.8 01/01/2023 1151   CL 102 01/01/2023 1151   CO2 31 01/01/2023 1151   BUN 17 01/01/2023 1151   BUN 14 04/05/2020 0849   CREATININE 0.97 01/01/2023 1151   CREATININE 1.16 10/20/2020 1426      Component Value Date/Time   CALCIUM 9.1 01/01/2023 1151   ALKPHOS 68 12/03/2022 1101   AST 16 12/03/2022 1101   ALT 10 12/03/2022 1101   BILITOT 0.7 12/03/2022 1101   BILITOT 0.9 04/05/2020 0849       Lab Results  Component Value Date   WBC 7.4 12/03/2022   HGB 14.4 12/03/2022   HCT 42.4 12/03/2022   MCV 96.0  12/03/2022   PLT 149.0 (L) 12/03/2022    Lab Results  Component Value Date   TSH 1.75 12/03/2022     Total time spent on today's visit was *** minutes, including both face-to-face time and nonface-to-face time.  Time included that spent on review of records (prior notes available to me/labs/imaging if pertinent), discussing treatment and goals, answering patient's questions and coordinating care.  Cc:  [name], MD

## 2023-04-17 ENCOUNTER — Encounter: Payer: Self-pay | Admitting: Neurology

## 2023-04-17 ENCOUNTER — Ambulatory Visit: Payer: PPO | Admitting: Neurology

## 2023-04-17 VITALS — BP 116/74 | HR 74 | Wt 174.4 lb

## 2023-04-17 DIAGNOSIS — G20A2 Parkinson's disease without dyskinesia, with fluctuations: Secondary | ICD-10-CM

## 2023-04-17 DIAGNOSIS — G20A1 Parkinson's disease without dyskinesia, without mention of fluctuations: Secondary | ICD-10-CM

## 2023-04-17 MED ORDER — CARBIDOPA-LEVODOPA 25-100 MG PO TABS: ORAL_TABLET | ORAL | 1 refills | Status: DC

## 2023-04-17 NOTE — Patient Instructions (Signed)
Take carbidopa/levodopa, 2 tablets at 7am, 1 at 11am, 1 at 3pm, 1 at 7pm Take carbidopa/levodopa 50/200 at bedtime

## 2023-04-24 ENCOUNTER — Telehealth: Payer: Self-pay | Admitting: Family Medicine

## 2023-04-24 NOTE — Telephone Encounter (Signed)
Called pt regarding 3 fells and he stated he was fine ,no injuries. Pt has a follow up  Appointment coming up ,and ask him if he need to be seen. He stated he was ok.

## 2023-04-24 NOTE — Telephone Encounter (Signed)
Britta Mccreedy from [facility] just wanted to let us know pt fell three time 7/15 and sustained no injuries. For any question her number is [phone].

## 2023-05-01 ENCOUNTER — Ambulatory Visit: Payer: PPO | Admitting: Physician Assistant

## 2023-05-01 ENCOUNTER — Encounter: Payer: Self-pay | Admitting: Physician Assistant

## 2023-05-01 VITALS — BP 110/70 | HR 74 | Resp 18 | Ht 65.0 in

## 2023-05-01 DIAGNOSIS — R413 Other amnesia: Secondary | ICD-10-CM | POA: Diagnosis not present
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
## 2023-05-01 NOTE — Patient Instructions (Addendum)
It was a pleasure to see you today at our office.   Recommendations:  Neurocognitive evaluation at our office MRI brain prior to Neuropsych testing  Depakote 125 mg at night for mood  Follow up in 6 months  Continue the Parkinson's meds as prescribed by Dr. Arbutus Leas   Whom to call:  Memory  decline, memory medications: Call our office [phone]   For psychiatric meds, mood meds: Please have your primary care physician manage these medications.   Counseling regarding caregiver distress, including caregiver depression, anxiety and issues regarding community resources, adult day care programs, adult living facilities, or memory care questions:   Feel free to contact Misty Lisabeth Register, Social Worker at [phone]  For guidance in geriatric dementia issues please call [facility] [phone]    If you have any severe symptoms of a stroke, or other severe issues such as confusion,severe chills or fever, etc call 911 or go to the ER as you may need to be evaluated further     RECOMMENDATIONS FOR ALL PATIENTS WITH MEMORY PROBLEMS: 1. Continue to exercise (Recommend 30 minutes of walking everyday, or 3 hours every week) 2. Increase social interactions - continue going to [place] and enjoy social gatherings with friends and family 3. Eat healthy, avoid fried foods and eat more fruits and vegetables 4. Maintain adequate blood pressure, blood sugar, and blood cholesterol level. Reducing the risk of stroke and cardiovascular disease also helps promoting better memory. 5. Avoid stressful situations. Live a simple life and avoid aggravations. Organize your time and prepare for the next day in anticipation. 6. Sleep well, avoid any interruptions of sleep and avoid any distractions in the bedroom that may interfere with adequate sleep quality 7. Avoid sugar, avoid sweets as there is a strong link between excessive sugar intake, diabetes, and cognitive impairment We discussed the  Mediterranean diet, which has been shown to help patients reduce the risk of progressive memory disorders and reduces cardiovascular risk. This includes eating fish, eat fruits and green leafy vegetables, nuts like almonds and hazelnuts, walnuts, and also use olive oil. Avoid fast foods and fried foods as much as possible. Avoid sweets and sugar as sugar use has been linked to worsening of memory function.  There is always a concern of gradual progression of memory problems. If this is the case, then we may need to adjust level of care according to patient needs. Support, both to the patient and caregiver, should then be put into place.      You have been referred for a neuropsychological evaluation (i.e., evaluation of memory and thinking abilities). Please bring someone with you to this appointment if possible, as it is helpful for the doctor to hear from both you and another adult who knows you well. Please bring eyeglasses and hearing aids if you wear them.    The evaluation will take approximately 3 hours and has two parts:   The first part is a clinical interview with the neuropsychologist (Dr. Milbert Coulter or Dr. Roseanne Reno). During the interview, the neuropsychologist will speak with you and the individual you brought to the appointment.    The second part of the evaluation is testing with the doctor's technician Annabelle Harman or Selena Batten). During the testing, the technician will ask you to remember different types of material, solve problems, and answer some questionnaires. Your family member will not be present for this portion of the evaluation.   Please note: We must reserve several hours of the neuropsychologist's time and the psychometrician's  time for your evaluation appointment. As such, there is a No-Show fee of $100. If you are unable to attend any of your appointments, please contact our office as soon as possible to reschedule.    FALL PRECAUTIONS: Be cautious when walking. Scan the area for obstacles  that may increase the risk of trips and falls. When getting up in the mornings, sit up at the edge of the bed for a few minutes before getting out of bed. Consider elevating the bed at the head end to avoid drop of blood pressure when getting up. Walk always in a well-lit room (use night lights in the walls). Avoid area rugs or power cords from appliances in the middle of the walkways. Use a walker or a cane if necessary and consider physical therapy for balance exercise. Get your eyesight checked regularly.  FINANCIAL OVERSIGHT: Supervision, especially oversight when making financial decisions or transactions is also recommended.  HOME SAFETY: Consider the safety of the kitchen when operating appliances like stoves, microwave oven, and blender. Consider having supervision and share cooking responsibilities until no longer able to participate in those. Accidents with firearms and other hazards in the house should be identified and addressed as well.   ABILITY TO BE LEFT ALONE: If patient is unable to contact 911 operator, consider using [facility], or when the need is there, arrange for someone to stay with patients. Smoking is a fire hazard, consider supervision or cessation. Risk of wandering should be assessed by caregiver and if detected at any point, supervision and safe proof recommendations should be instituted.  MEDICATION SUPERVISION: Inability to self-administer medication needs to be constantly addressed. Implement a mechanism to ensure safe administration of the medications.   DRIVING: Regarding driving, in patients with progressive memory problems, driving will be impaired. We advise to have someone else do the driving if trouble finding directions or if minor accidents are reported. Independent driving assessment is available to determine safety of driving.   If you are interested in the driving assessment, you can contact the following:  [facility] [employer] in [place]  [phone]  [facility] [phone]  [facility] [phone] or [phone]    Mediterranean Diet A Mediterranean diet refers to food and lifestyle choices that are based on the traditions of countries located on the [employer]. This way of eating has been shown to help prevent certain conditions and improve outcomes for people who have chronic diseases, like kidney disease and heart disease. What are tips for following this plan? Lifestyle  Cook and eat meals together with your family, when possible. Drink enough fluid to keep your urine clear or pale yellow. Be physically active every day. This includes: Aerobic exercise like running or swimming. Leisure activities like gardening, walking, or housework. Get 7-8 hours of sleep each night. If recommended by your health care provider, drink red wine in moderation. This means 1 glass a day for nonpregnant women and 2 glasses a day for men. A glass of wine equals 5 oz (150 mL). Reading food labels  Check the serving size of packaged foods. For foods such as rice and pasta, the serving size refers to the amount of cooked product, not dry. Check the total fat in packaged foods. Avoid foods that have saturated fat or trans fats. Check the ingredients list for added sugars, such as corn syrup. Shopping  At the grocery store, buy most of your food from the areas near the walls of the  store. This includes: Fresh fruits and vegetables (produce). Grains, beans, nuts, and seeds. Some of these may be available in unpackaged forms or large amounts (in bulk). Fresh seafood. Poultry and eggs. Low-fat dairy products. Buy whole ingredients instead of prepackaged foods. Buy fresh fruits and vegetables in-season from local farmers markets. Buy frozen fruits and vegetables in resealable bags. If you do not have access to quality fresh seafood, buy precooked frozen shrimp or canned  fish, such as tuna, salmon, or sardines. Buy small amounts of raw or cooked vegetables, salads, or olives from the deli or salad bar at your store. Stock your pantry so you always have certain foods on hand, such as olive oil, canned tuna, canned tomatoes, rice, pasta, and beans. Cooking  Cook foods with extra-virgin olive oil instead of using butter or other vegetable oils. Have meat as a side dish, and have vegetables or grains as your main dish. This means having meat in small portions or adding small amounts of meat to foods like pasta or stew. Use beans or vegetables instead of meat in common dishes like chili or lasagna. Experiment with different cooking methods. Try roasting or broiling vegetables instead of steaming or sauteing them. Add frozen vegetables to soups, stews, pasta, or rice. Add nuts or seeds for added healthy fat at each meal. You can add these to yogurt, salads, or vegetable dishes. Marinate fish or vegetables using olive oil, lemon juice, garlic, and fresh herbs. Meal planning  Plan to eat 1 vegetarian meal one day each week. Try to work up to 2 vegetarian meals, if possible. Eat seafood 2 or more times a week. Have healthy snacks readily available, such as: Vegetable sticks with hummus. Greek yogurt. Fruit and nut trail mix. Eat balanced meals throughout the week. This includes: Fruit: 2-3 servings a day Vegetables: 4-5 servings a day Low-fat dairy: 2 servings a day Fish, poultry, or lean meat: 1 serving a day Beans and legumes: 2 or more servings a week Nuts and seeds: 1-2 servings a day Whole grains: 6-8 servings a day Extra-virgin olive oil: 3-4 servings a day Limit red meat and sweets to only a few servings a month What are my food choices? Mediterranean diet Recommended Grains: Whole-grain pasta. Brown rice. Bulgar wheat. Polenta. Couscous. Whole-wheat bread. Orpah Cobb. Vegetables: Artichokes. Beets. Broccoli. Cabbage. Carrots. Eggplant. Green  beans. Chard. Kale. Spinach. Onions. Leeks. Peas. Squash. Tomatoes. Peppers. Radishes. Fruits: Apples. Apricots. Avocado. Berries. Bananas. Cherries. Dates. Figs. Grapes. Lemons. Melon. Oranges. Peaches. Plums. Pomegranate. Meats and other protein foods: Beans. Almonds. Sunflower seeds. Pine nuts. Peanuts. Cod. Salmon. Scallops. Shrimp. Tuna. Tilapia. Clams. Oysters. Eggs. Dairy: Low-fat milk. Cheese. Greek yogurt. Beverages: Water. Red wine. Herbal tea. Fats and oils: Extra virgin olive oil. Avocado oil. Grape seed oil. Sweets and desserts: [place] yogurt with honey. Baked apples. Poached pears. Trail mix. Seasoning and other foods: Basil. Cilantro. Coriander. Cumin. Mint. Parsley. Sage. Rosemary. Tarragon. Garlic. Oregano. Thyme. Pepper. Balsalmic vinegar. Tahini. Hummus. Tomato sauce. Olives. Mushrooms. Limit these Grains: Prepackaged pasta or rice dishes. Prepackaged cereal with added sugar. Vegetables: Deep fried potatoes (french fries). Fruits: Fruit canned in syrup. Meats and other protein foods: Beef. Pork. Lamb. Poultry with skin. Hot dogs. Tomasa Blase. Dairy: Ice cream. Sour cream. Whole milk. Beverages: Juice. Sugar-sweetened soft drinks. Beer. Liquor and spirits. Fats and oils: Butter. Canola oil. Vegetable oil. Beef fat (tallow). Lard. Sweets and desserts: Cookies. Cakes. Pies. Candy. Seasoning and other foods: Mayonnaise. Premade sauces and marinades. The items listed may not  be a complete list. Talk with your dietitian about what dietary choices are right for you. Summary The Mediterranean diet includes both food and lifestyle choices. Eat a variety of fresh fruits and vegetables, beans, nuts, seeds, and whole grains. Limit the amount of red meat and sweets that you eat. Talk with your health care provider about whether it is safe for you to drink red wine in moderation. This means 1 glass a day for nonpregnant women and 2 glasses a day for men. A glass of wine equals 5 oz (150  mL). This information is not intended to replace advice given to you by your health care provider. Make sure you discuss any questions you have with your health care provider. Document Released: 05/16/2016 Document Revised: 06/18/2016 Document Reviewed: 05/16/2016 Elsevier Interactive Patient Education  2017 [employer].      MRI brain at [facility] [phone]

## 2023-05-02 ENCOUNTER — Ambulatory Visit
Admission: RE | Admit: 2023-05-02 | Discharge: 2023-05-02 | Disposition: A | Discharge status: Home / Self Care | Payer: PPO | Source: Ambulatory Visit | Attending: Physician Assistant | Admitting: Physician Assistant

## 2023-05-02 DIAGNOSIS — R413 Other amnesia: Secondary | ICD-10-CM | POA: Diagnosis not present

## 2023-05-02 DIAGNOSIS — G319 Degenerative disease of nervous system, unspecified: Secondary | ICD-10-CM | POA: Diagnosis not present

## 2023-05-02 DIAGNOSIS — I6782 Cerebral ischemia: Secondary | ICD-10-CM | POA: Diagnosis not present

## 2023-05-02 NOTE — Addendum Note (Signed)
Addended by: Marlowe Kays E on: 05/02/2023 06:27 AM   Modules accepted: Level of Service

## 2023-05-06 ENCOUNTER — Ambulatory Visit (INDEPENDENT_AMBULATORY_CARE_PROVIDER_SITE_OTHER): Payer: PPO | Admitting: Family Medicine

## 2023-05-06 ENCOUNTER — Encounter: Payer: Self-pay | Admitting: Family Medicine

## 2023-05-06 ENCOUNTER — Ambulatory Visit: Payer: PPO | Admitting: Family Medicine

## 2023-05-06 VITALS — BP 102/55 | HR 78 | Ht 65.0 in

## 2023-05-06 DIAGNOSIS — H9313 Tinnitus, bilateral: Secondary | ICD-10-CM

## 2023-05-06 NOTE — Patient Instructions (Addendum)
Normal exam today.  You can try using Flonase daily (2 sprays in each nostril) for a few weeks in case there is any fluid backing up. If this does not help, have your hearing aids checked with audiologist, or we can see ENT specialist. If symptoms worsen or change, let us know.

## 2023-05-06 NOTE — Progress Notes (Signed)
   Acute Office Visit  Subjective:     Patient ID: Walter Brown, male    DOB: 10/07/1941, 82 y.o.   MRN: [id number]  Chief Complaint  Patient presents with   Ear Problem    HPI Patient is in today for ear concern.   Discussed the use of AI scribe software for clinical note transcription with the patient, who gave verbal consent to proceed.  History of Present Illness   The patient, with a history of hearing aid use, has been experiencing a clicking sound in his ears for the past two weeks. The clicking, which is not associated with any pain, hearing difficulties, or drainage, occurs several times a day and lasts for about two to three minutes each time. The patient denies any associated pressure in the ears or any recent upper respiratory infections or allergies, dizziness, etc. The clicking sound is noticed even when the hearing aids are not in use.           ROS All review of systems negative except what is listed in the HPI      Objective:    BP (!) 102/55   Pulse 78   Ht 5\' 5"  (1.651 m)   SpO2 96%   BMI 29.02 kg/m    Physical Exam Vitals reviewed.  Constitutional:      Appearance: Normal appearance.  HENT:     Head: Normocephalic and atraumatic.     Right Ear: Tympanic membrane, ear canal and external ear normal.     Left Ear: Tympanic membrane, ear canal and external ear normal.     Nose: Nose normal.     Mouth/Throat:     Mouth: Mucous membranes are moist.     Pharynx: Oropharynx is clear.  Eyes:     Extraocular Movements: Extraocular movements intact.     Conjunctiva/sclera: Conjunctivae normal.     Pupils: Pupils are equal, round, and reactive to light.  Cardiovascular:     Rate and Rhythm: Normal rate and regular rhythm.     Heart sounds: Normal heart sounds.  Pulmonary:     Effort: Pulmonary effort is normal.     Breath sounds: Normal breath sounds.  Musculoskeletal:     Cervical back: Normal range of motion and neck supple.  Skin:     General: Skin is warm and dry.  Neurological:     Mental Status: He is alert and oriented to person, place, and time.  Psychiatric:        Mood and Affect: Mood normal.        Behavior: Behavior normal.        Thought Content: Thought content normal.        Judgment: Judgment normal.     No results found for any visits on 05/06/23.      Assessment & Plan:   Problem List Items Addressed This Visit   None Visit Diagnoses     Ear clicks, bilateral    -  Primary Normal exam today.  You can try using Flonase daily (2 sprays in each nostril) for a few weeks in case there is any fluid component.. If this does not help, have your hearing aids checked with audiologist, or we can see ENT specialist. If symptoms worsen or change, let us know.        No orders of the defined types were placed in this encounter.   Return if symptoms worsen or fail to improve.  Clayborne Dana, NP

## 2023-05-12 DIAGNOSIS — G20C Parkinsonism, unspecified: Secondary | ICD-10-CM | POA: Diagnosis not present

## 2023-05-12 DIAGNOSIS — E782 Mixed hyperlipidemia: Secondary | ICD-10-CM | POA: Diagnosis not present

## 2023-05-12 DIAGNOSIS — I2510 Atherosclerotic heart disease of native coronary artery without angina pectoris: Secondary | ICD-10-CM | POA: Diagnosis not present

## 2023-05-12 DIAGNOSIS — M1990 Unspecified osteoarthritis, unspecified site: Secondary | ICD-10-CM | POA: Diagnosis not present

## 2023-05-12 DIAGNOSIS — J45909 Unspecified asthma, uncomplicated: Secondary | ICD-10-CM | POA: Diagnosis not present

## 2023-05-12 DIAGNOSIS — I480 Paroxysmal atrial fibrillation: Secondary | ICD-10-CM | POA: Diagnosis not present

## 2023-05-12 DIAGNOSIS — I10 Essential (primary) hypertension: Secondary | ICD-10-CM | POA: Diagnosis not present

## 2023-05-12 DIAGNOSIS — F411 Generalized anxiety disorder: Secondary | ICD-10-CM | POA: Diagnosis not present

## 2023-05-12 DIAGNOSIS — D649 Anemia, unspecified: Secondary | ICD-10-CM | POA: Diagnosis not present

## 2023-05-13 ENCOUNTER — Encounter: Payer: Self-pay | Admitting: Family Medicine

## 2023-05-13 NOTE — Progress Notes (Signed)
There are some chronic circulation changes in the brain which have progressed since his last MRI in 2020.  On the left area of the brain, there is a tiny area suspicious for a tiny stroke, which will need further investigation.  The shape of the brain also shows more atrophy when compared to the prior MRI.  All these changes are likely to affect his memory.  Will need to order a MR angiogram of head and neck to further visualize the circulation making sure everything is okay.  Continue taking your Eliquis.  Thanks

## 2023-05-14 ENCOUNTER — Telehealth: Payer: Self-pay | Admitting: Neurology

## 2023-05-14 DIAGNOSIS — I10 Essential (primary) hypertension: Secondary | ICD-10-CM | POA: Diagnosis not present

## 2023-05-14 DIAGNOSIS — M1990 Unspecified osteoarthritis, unspecified site: Secondary | ICD-10-CM | POA: Diagnosis not present

## 2023-05-14 DIAGNOSIS — I480 Paroxysmal atrial fibrillation: Secondary | ICD-10-CM | POA: Diagnosis not present

## 2023-05-14 DIAGNOSIS — G20C Parkinsonism, unspecified: Secondary | ICD-10-CM | POA: Diagnosis not present

## 2023-05-14 DIAGNOSIS — G459 Transient cerebral ischemic attack, unspecified: Secondary | ICD-10-CM

## 2023-05-14 DIAGNOSIS — F411 Generalized anxiety disorder: Secondary | ICD-10-CM | POA: Diagnosis not present

## 2023-05-14 DIAGNOSIS — J45909 Unspecified asthma, uncomplicated: Secondary | ICD-10-CM | POA: Diagnosis not present

## 2023-05-14 DIAGNOSIS — E782 Mixed hyperlipidemia: Secondary | ICD-10-CM | POA: Diagnosis not present

## 2023-05-14 DIAGNOSIS — I2510 Atherosclerotic heart disease of native coronary artery without angina pectoris: Secondary | ICD-10-CM | POA: Diagnosis not present

## 2023-05-14 DIAGNOSIS — D649 Anemia, unspecified: Secondary | ICD-10-CM | POA: Diagnosis not present

## 2023-05-14 NOTE — Telephone Encounter (Signed)
Advised patient of MRI results, voiced understanding and order MRA of Head and MRA of neck at [facility], orders are pending for scheduling

## 2023-05-14 NOTE — Telephone Encounter (Signed)
Caller stated he's returning a phone call about MRI results. Requesting nurse

## 2023-05-15 ENCOUNTER — Other Ambulatory Visit: Payer: Self-pay | Admitting: Family Medicine

## 2023-05-15 ENCOUNTER — Other Ambulatory Visit: Payer: Self-pay | Admitting: Neurology

## 2023-05-15 DIAGNOSIS — G20A1 Parkinson's disease without dyskinesia, without mention of fluctuations: Secondary | ICD-10-CM

## 2023-05-19 DIAGNOSIS — F411 Generalized anxiety disorder: Secondary | ICD-10-CM | POA: Diagnosis not present

## 2023-05-19 DIAGNOSIS — E782 Mixed hyperlipidemia: Secondary | ICD-10-CM | POA: Diagnosis not present

## 2023-05-19 DIAGNOSIS — D649 Anemia, unspecified: Secondary | ICD-10-CM | POA: Diagnosis not present

## 2023-05-19 DIAGNOSIS — I480 Paroxysmal atrial fibrillation: Secondary | ICD-10-CM | POA: Diagnosis not present

## 2023-05-19 DIAGNOSIS — M1990 Unspecified osteoarthritis, unspecified site: Secondary | ICD-10-CM | POA: Diagnosis not present

## 2023-05-19 DIAGNOSIS — G20C Parkinsonism, unspecified: Secondary | ICD-10-CM | POA: Diagnosis not present

## 2023-05-19 DIAGNOSIS — I10 Essential (primary) hypertension: Secondary | ICD-10-CM | POA: Diagnosis not present

## 2023-05-19 DIAGNOSIS — J45909 Unspecified asthma, uncomplicated: Secondary | ICD-10-CM | POA: Diagnosis not present

## 2023-05-19 DIAGNOSIS — I2510 Atherosclerotic heart disease of native coronary artery without angina pectoris: Secondary | ICD-10-CM | POA: Diagnosis not present

## 2023-05-20 ENCOUNTER — Telehealth: Payer: Self-pay | Admitting: Family Medicine

## 2023-05-20 DIAGNOSIS — G20C Parkinsonism, unspecified: Secondary | ICD-10-CM | POA: Diagnosis not present

## 2023-05-20 DIAGNOSIS — E782 Mixed hyperlipidemia: Secondary | ICD-10-CM | POA: Diagnosis not present

## 2023-05-20 DIAGNOSIS — M1990 Unspecified osteoarthritis, unspecified site: Secondary | ICD-10-CM | POA: Diagnosis not present

## 2023-05-20 DIAGNOSIS — I480 Paroxysmal atrial fibrillation: Secondary | ICD-10-CM | POA: Diagnosis not present

## 2023-05-20 DIAGNOSIS — I10 Essential (primary) hypertension: Secondary | ICD-10-CM | POA: Diagnosis not present

## 2023-05-20 DIAGNOSIS — D649 Anemia, unspecified: Secondary | ICD-10-CM | POA: Diagnosis not present

## 2023-05-20 DIAGNOSIS — F411 Generalized anxiety disorder: Secondary | ICD-10-CM | POA: Diagnosis not present

## 2023-05-20 DIAGNOSIS — J45909 Unspecified asthma, uncomplicated: Secondary | ICD-10-CM | POA: Diagnosis not present

## 2023-05-20 DIAGNOSIS — I2510 Atherosclerotic heart disease of native coronary artery without angina pectoris: Secondary | ICD-10-CM | POA: Diagnosis not present

## 2023-05-20 NOTE — Telephone Encounter (Signed)
Pt mailed in paperwork from [facility] for provider to fill out. Paperwork placed in provider's box and requested call when complete

## 2023-05-20 NOTE — Telephone Encounter (Signed)
Got the forms placed in Dr.Blyth bin for her to review them.

## 2023-05-22 ENCOUNTER — Encounter (INDEPENDENT_AMBULATORY_CARE_PROVIDER_SITE_OTHER): Payer: Self-pay

## 2023-05-23 ENCOUNTER — Ambulatory Visit: Payer: PPO | Admitting: Neurology

## 2023-05-23 NOTE — Telephone Encounter (Signed)
Pt states he needs this as soon as possible. He states if he does not mail it back within 30 days they will revoke his license. He would like to know if this document can either be sent to pcp via email or if another dr can look at it. Please advise he is hoping to get an answer today.

## 2023-05-26 ENCOUNTER — Telehealth: Payer: Self-pay | Admitting: Neurology

## 2023-05-26 DIAGNOSIS — I10 Essential (primary) hypertension: Secondary | ICD-10-CM | POA: Diagnosis not present

## 2023-05-26 DIAGNOSIS — E782 Mixed hyperlipidemia: Secondary | ICD-10-CM | POA: Diagnosis not present

## 2023-05-26 DIAGNOSIS — J45909 Unspecified asthma, uncomplicated: Secondary | ICD-10-CM | POA: Diagnosis not present

## 2023-05-26 DIAGNOSIS — I480 Paroxysmal atrial fibrillation: Secondary | ICD-10-CM | POA: Diagnosis not present

## 2023-05-26 DIAGNOSIS — M1990 Unspecified osteoarthritis, unspecified site: Secondary | ICD-10-CM | POA: Diagnosis not present

## 2023-05-26 DIAGNOSIS — F411 Generalized anxiety disorder: Secondary | ICD-10-CM | POA: Diagnosis not present

## 2023-05-26 DIAGNOSIS — D649 Anemia, unspecified: Secondary | ICD-10-CM | POA: Diagnosis not present

## 2023-05-26 DIAGNOSIS — I2510 Atherosclerotic heart disease of native coronary artery without angina pectoris: Secondary | ICD-10-CM | POA: Diagnosis not present

## 2023-05-26 DIAGNOSIS — G20C Parkinsonism, unspecified: Secondary | ICD-10-CM | POA: Diagnosis not present

## 2023-05-26 NOTE — Telephone Encounter (Signed)
Pts spouse dropped off pages 6-7 DMV forms that need to be completed by the provider pt would like for it to be faxed to [phone] when completed.  Pt would like to be called once it has been faxed. Placed in the providers box to complete.

## 2023-05-26 NOTE — Telephone Encounter (Signed)
Called pt was advised one of the forms his Neurology providers  Has to complete, we will have form up front for pick. Pt stated he understand will come by to pickup forms.

## 2023-05-26 NOTE — Telephone Encounter (Signed)
I have paperwork

## 2023-05-26 NOTE — Telephone Encounter (Signed)
Called pt was advised Dr.Blyth is out of the office ,but will be back  Thursday she will review them. We will give him a call with update Thursday ,and pt stated he understand.

## 2023-05-27 DIAGNOSIS — N401 Enlarged prostate with lower urinary tract symptoms: Secondary | ICD-10-CM | POA: Diagnosis not present

## 2023-05-27 DIAGNOSIS — N319 Neuromuscular dysfunction of bladder, unspecified: Secondary | ICD-10-CM | POA: Diagnosis not present

## 2023-05-27 DIAGNOSIS — R3915 Urgency of urination: Secondary | ICD-10-CM | POA: Diagnosis not present

## 2023-05-28 ENCOUNTER — Telehealth: Payer: Self-pay | Admitting: Physician Assistant

## 2023-05-28 ENCOUNTER — Encounter: Payer: PPO | Admitting: Psychology

## 2023-05-28 ENCOUNTER — Encounter: Payer: Self-pay | Admitting: Physician Assistant

## 2023-05-28 DIAGNOSIS — E782 Mixed hyperlipidemia: Secondary | ICD-10-CM | POA: Diagnosis not present

## 2023-05-28 DIAGNOSIS — G20C Parkinsonism, unspecified: Secondary | ICD-10-CM | POA: Diagnosis not present

## 2023-05-28 DIAGNOSIS — M1990 Unspecified osteoarthritis, unspecified site: Secondary | ICD-10-CM | POA: Diagnosis not present

## 2023-05-28 DIAGNOSIS — J45909 Unspecified asthma, uncomplicated: Secondary | ICD-10-CM | POA: Diagnosis not present

## 2023-05-28 DIAGNOSIS — D649 Anemia, unspecified: Secondary | ICD-10-CM | POA: Diagnosis not present

## 2023-05-28 DIAGNOSIS — I480 Paroxysmal atrial fibrillation: Secondary | ICD-10-CM | POA: Diagnosis not present

## 2023-05-28 DIAGNOSIS — I10 Essential (primary) hypertension: Secondary | ICD-10-CM | POA: Diagnosis not present

## 2023-05-28 DIAGNOSIS — F411 Generalized anxiety disorder: Secondary | ICD-10-CM | POA: Diagnosis not present

## 2023-05-28 DIAGNOSIS — I2510 Atherosclerotic heart disease of native coronary artery without angina pectoris: Secondary | ICD-10-CM | POA: Diagnosis not present

## 2023-05-28 NOTE — Telephone Encounter (Signed)
Patient came in to the office today to be seen but didn't realize his appointment had been canceled and changed for 10/23/23. Patient would like a call back

## 2023-05-30 ENCOUNTER — Telehealth: Payer: Self-pay

## 2023-05-30 NOTE — Telephone Encounter (Signed)
Called pt was advised the DMV forms are ready for pickup whenever he like to come by to pick.Forms are  Upfront.

## 2023-05-30 NOTE — Telephone Encounter (Signed)
They didn't drop off the entire form, including the HIPAA waiver part of the form.  We need the complete form to be able to complete it.

## 2023-05-30 NOTE — Telephone Encounter (Signed)
Pt called and was given the below message that Dr. Arbutus Leas is needing the entire form so that it can be filled out.  Pt stated that he will get it over to [place] next week so that he is able to turn it in.

## 2023-05-30 NOTE — Telephone Encounter (Signed)
Sent patient my chart message about bringing additional forms

## 2023-06-02 ENCOUNTER — Ambulatory Visit
Admission: RE | Admit: 2023-06-02 | Discharge: 2023-06-02 | Disposition: A | Discharge status: Home / Self Care | Payer: Medicare HMO | Source: Ambulatory Visit | Attending: Physician Assistant | Admitting: Physician Assistant

## 2023-06-02 DIAGNOSIS — G459 Transient cerebral ischemic attack, unspecified: Secondary | ICD-10-CM | POA: Diagnosis not present

## 2023-06-02 MED ORDER — GADOPICLENOL 0.5 MMOL/ML IV SOLN
7.0000 mL | Freq: Once | INTRAVENOUS | Status: AC | PRN
  Administered 2023-06-02: 7 mL via INTRAVENOUS

## 2023-06-02 MED ORDER — GADOPICLENOL 0.5 MMOL/ML IV SOLN: 9.0000 mL | Freq: Once | INTRAVENOUS | Status: DC | PRN

## 2023-06-02 NOTE — Telephone Encounter (Signed)
Pt spouse came by and gave the entire DMV form to be completed by the provider as requested.  Spouse would like to have a call once it has been completed so that she is abel to pick it up and also faxed to the [facility].  Placed in the providers box for completion.

## 2023-06-03 DIAGNOSIS — G20C Parkinsonism, unspecified: Secondary | ICD-10-CM | POA: Diagnosis not present

## 2023-06-03 DIAGNOSIS — E782 Mixed hyperlipidemia: Secondary | ICD-10-CM | POA: Diagnosis not present

## 2023-06-03 DIAGNOSIS — F411 Generalized anxiety disorder: Secondary | ICD-10-CM | POA: Diagnosis not present

## 2023-06-03 DIAGNOSIS — I10 Essential (primary) hypertension: Secondary | ICD-10-CM | POA: Diagnosis not present

## 2023-06-03 DIAGNOSIS — D649 Anemia, unspecified: Secondary | ICD-10-CM | POA: Diagnosis not present

## 2023-06-03 DIAGNOSIS — M1990 Unspecified osteoarthritis, unspecified site: Secondary | ICD-10-CM | POA: Diagnosis not present

## 2023-06-03 DIAGNOSIS — J45909 Unspecified asthma, uncomplicated: Secondary | ICD-10-CM | POA: Diagnosis not present

## 2023-06-03 DIAGNOSIS — I480 Paroxysmal atrial fibrillation: Secondary | ICD-10-CM | POA: Diagnosis not present

## 2023-06-03 DIAGNOSIS — I2510 Atherosclerotic heart disease of native coronary artery without angina pectoris: Secondary | ICD-10-CM | POA: Diagnosis not present

## 2023-06-04 ENCOUNTER — Encounter: Payer: PPO | Admitting: Psychology

## 2023-06-04 ENCOUNTER — Telehealth: Payer: Self-pay | Admitting: Physician Assistant

## 2023-06-04 DIAGNOSIS — G20C Parkinsonism, unspecified: Secondary | ICD-10-CM | POA: Diagnosis not present

## 2023-06-04 DIAGNOSIS — M1990 Unspecified osteoarthritis, unspecified site: Secondary | ICD-10-CM | POA: Diagnosis not present

## 2023-06-04 DIAGNOSIS — F411 Generalized anxiety disorder: Secondary | ICD-10-CM | POA: Diagnosis not present

## 2023-06-04 DIAGNOSIS — D649 Anemia, unspecified: Secondary | ICD-10-CM | POA: Diagnosis not present

## 2023-06-04 DIAGNOSIS — I10 Essential (primary) hypertension: Secondary | ICD-10-CM | POA: Diagnosis not present

## 2023-06-04 DIAGNOSIS — E782 Mixed hyperlipidemia: Secondary | ICD-10-CM | POA: Diagnosis not present

## 2023-06-04 DIAGNOSIS — I2510 Atherosclerotic heart disease of native coronary artery without angina pectoris: Secondary | ICD-10-CM | POA: Diagnosis not present

## 2023-06-04 DIAGNOSIS — J45909 Unspecified asthma, uncomplicated: Secondary | ICD-10-CM | POA: Diagnosis not present

## 2023-06-04 DIAGNOSIS — I480 Paroxysmal atrial fibrillation: Secondary | ICD-10-CM | POA: Diagnosis not present

## 2023-06-04 NOTE — Telephone Encounter (Signed)
Pt left message with the after hour service on 06-03-23   He wants to talk to Huntley Dec or the nurse about the Results of the MRI

## 2023-06-04 NOTE — Telephone Encounter (Signed)
MRA results pending, will call shortly

## 2023-06-04 NOTE — Telephone Encounter (Signed)
I call patient back its still pending the MRA Results. Will advise when it is completed.

## 2023-06-04 NOTE — Telephone Encounter (Signed)
Patient called wanting MRI results.

## 2023-06-05 DIAGNOSIS — H353112 Nonexudative age-related macular degeneration, right eye, intermediate dry stage: Secondary | ICD-10-CM | POA: Diagnosis not present

## 2023-06-05 DIAGNOSIS — H35372 Puckering of macula, left eye: Secondary | ICD-10-CM | POA: Diagnosis not present

## 2023-06-05 DIAGNOSIS — H43813 Vitreous degeneration, bilateral: Secondary | ICD-10-CM | POA: Diagnosis not present

## 2023-06-05 DIAGNOSIS — H353221 Exudative age-related macular degeneration, left eye, with active choroidal neovascularization: Secondary | ICD-10-CM | POA: Diagnosis not present

## 2023-06-06 DIAGNOSIS — H5203 Hypermetropia, bilateral: Secondary | ICD-10-CM | POA: Diagnosis not present

## 2023-06-06 DIAGNOSIS — H2513 Age-related nuclear cataract, bilateral: Secondary | ICD-10-CM | POA: Diagnosis not present

## 2023-06-10 DIAGNOSIS — M1990 Unspecified osteoarthritis, unspecified site: Secondary | ICD-10-CM | POA: Diagnosis not present

## 2023-06-10 DIAGNOSIS — I2510 Atherosclerotic heart disease of native coronary artery without angina pectoris: Secondary | ICD-10-CM | POA: Diagnosis not present

## 2023-06-10 DIAGNOSIS — G20C Parkinsonism, unspecified: Secondary | ICD-10-CM | POA: Diagnosis not present

## 2023-06-10 DIAGNOSIS — J45909 Unspecified asthma, uncomplicated: Secondary | ICD-10-CM | POA: Diagnosis not present

## 2023-06-10 DIAGNOSIS — I480 Paroxysmal atrial fibrillation: Secondary | ICD-10-CM | POA: Diagnosis not present

## 2023-06-10 DIAGNOSIS — D649 Anemia, unspecified: Secondary | ICD-10-CM | POA: Diagnosis not present

## 2023-06-10 DIAGNOSIS — E782 Mixed hyperlipidemia: Secondary | ICD-10-CM | POA: Diagnosis not present

## 2023-06-10 DIAGNOSIS — I10 Essential (primary) hypertension: Secondary | ICD-10-CM | POA: Diagnosis not present

## 2023-06-10 DIAGNOSIS — F411 Generalized anxiety disorder: Secondary | ICD-10-CM | POA: Diagnosis not present

## 2023-06-10 NOTE — Telephone Encounter (Signed)
Pt left voice mail stating he has not rec'd his results from his MRI 06/02/2023.  Pt is aware that someone from the office will give him a call once we have rec'd them.

## 2023-06-11 DIAGNOSIS — D649 Anemia, unspecified: Secondary | ICD-10-CM | POA: Diagnosis not present

## 2023-06-11 DIAGNOSIS — I10 Essential (primary) hypertension: Secondary | ICD-10-CM | POA: Diagnosis not present

## 2023-06-11 DIAGNOSIS — M1990 Unspecified osteoarthritis, unspecified site: Secondary | ICD-10-CM | POA: Diagnosis not present

## 2023-06-11 DIAGNOSIS — F411 Generalized anxiety disorder: Secondary | ICD-10-CM | POA: Diagnosis not present

## 2023-06-11 DIAGNOSIS — I480 Paroxysmal atrial fibrillation: Secondary | ICD-10-CM | POA: Diagnosis not present

## 2023-06-11 DIAGNOSIS — J45909 Unspecified asthma, uncomplicated: Secondary | ICD-10-CM | POA: Diagnosis not present

## 2023-06-11 DIAGNOSIS — G20C Parkinsonism, unspecified: Secondary | ICD-10-CM | POA: Diagnosis not present

## 2023-06-11 DIAGNOSIS — I2510 Atherosclerotic heart disease of native coronary artery without angina pectoris: Secondary | ICD-10-CM | POA: Diagnosis not present

## 2023-06-11 DIAGNOSIS — E782 Mixed hyperlipidemia: Secondary | ICD-10-CM | POA: Diagnosis not present

## 2023-06-12 NOTE — Telephone Encounter (Signed)
Pt is calling in to check the status of the results and he is aware that we have not gotten them as of yet and someone from the office will give him a call with the results.

## 2023-06-13 ENCOUNTER — Encounter: Payer: Self-pay | Admitting: Physician Assistant

## 2023-06-13 ENCOUNTER — Ambulatory Visit (INDEPENDENT_AMBULATORY_CARE_PROVIDER_SITE_OTHER): Payer: Medicare HMO | Admitting: Physician Assistant

## 2023-06-13 ENCOUNTER — Telehealth: Payer: Self-pay | Admitting: Family Medicine

## 2023-06-13 VITALS — BP 136/82 | HR 96 | Temp 98.4°F | Resp 20 | Wt 170.0 lb

## 2023-06-13 DIAGNOSIS — I480 Paroxysmal atrial fibrillation: Secondary | ICD-10-CM | POA: Diagnosis not present

## 2023-06-13 DIAGNOSIS — R001 Bradycardia, unspecified: Secondary | ICD-10-CM | POA: Diagnosis not present

## 2023-06-13 NOTE — Addendum Note (Signed)
Addended byAlfredia Ferguson on: 06/13/2023 04:50 PM   Modules accepted: Orders

## 2023-06-13 NOTE — Progress Notes (Signed)
Established patient visit   Patient: Walter Brown   DOB: March 25, 1941   82 y.o. Male  MRN: [id number] Visit Date: 06/13/2023  Today's healthcare provider: Alfredia Ferguson, PA-C   Chief Complaint  Patient presents with   Bradycardia    Bradycardia episodes started last Thursday   Subjective    HPI  Pt reports he starting having episodes of a slow heart rate being picked up on his apple watch and while checking his blood pressure. Reports seeing as low as 41, 50s. Denies any chest pain, dizziness, palpitations, SOB.   Medications: Outpatient Medications Prior to Visit  Medication Sig   amiodarone (PACERONE) 200 MG tablet TAKE 1/2 TABLET BY MOUTH DAILY   calcium-vitamin D (OSCAL WITH D) 500-5 MG-MCG tablet Take 1 tablet by mouth.   carbidopa-levodopa (SINEMET CR) 50-200 MG tablet TAKE 1 TABLET BY MOUTH EVERYDAY AT BEDTIME   carbidopa-levodopa (SINEMET IR) 25-100 MG tablet TAKE 1 TABLET BY MOUTH 3 (THREE) TIMES DAILY. 7AM/11AM/4PM Can take an extra prn   divalproex (DEPAKOTE) 125 MG DR tablet Take 2 tablets (250 mg total) by mouth daily.   ELIQUIS 5 MG TABS tablet TAKE 1 TABLET BY MOUTH TWICE A DAY   ezetimibe (ZETIA) 10 MG tablet TAKE 1 TABLET BY MOUTH EVERY DAY   hydrochlorothiazide (HYDRODIURIL) 25 MG tablet TAKE 1 TABLET BY MOUTH EVERY DAY   Melatonin 10 MG TABS Take 10 mg by mouth daily as needed.   mirabegron ER (MYRBETRIQ) 50 MG TB24 tablet Take 50 mg by mouth daily.   Multiple Vitamins-Minerals (ICAPS AREDS 2 PO) Take 1 capsule by mouth in the morning and at bedtime.   pantoprazole (PROTONIX) 40 MG tablet TAKE 1 TABLET BY MOUTH EVERY DAY   sertraline (ZOLOFT) 100 MG tablet Take 1 tablet (100 mg total) by mouth daily.   No facility-administered medications prior to visit.    Review of Systems  Constitutional:  Negative for fatigue and fever.  Respiratory:  Negative for cough and shortness of breath.   Cardiovascular:  Negative for chest pain, palpitations and leg  swelling.  Neurological:  Negative for dizziness and headaches.      Objective    BP 136/82 (BP Location: Right Arm, Patient Position: Sitting, Cuff Size: Normal)   Pulse 96   Temp 98.4 F (36.9 C) (Oral)   Resp 20   Wt 170 lb (77.1 kg)   SpO2 98%   BMI 28.29 kg/m   Physical Exam Constitutional:      General: He is awake.     Appearance: He is well-developed.  HENT:     Head: Normocephalic.  Eyes:     Conjunctiva/sclera: Conjunctivae normal.  Cardiovascular:     Rate and Rhythm: Normal rate. Rhythm irregular.     Heart sounds: Normal heart sounds.  Pulmonary:     Effort: Pulmonary effort is normal.     Breath sounds: Normal breath sounds.  Skin:    General: Skin is warm.  Neurological:     Mental Status: He is alert and oriented to person, place, and time.  Psychiatric:        Attention and Perception: Attention normal.        Mood and Affect: Mood normal.        Speech: Speech normal.        Behavior: Behavior is cooperative.      No results found for any visits on 06/13/23.  Assessment & Plan     1. PAF (  paroxysmal atrial fibrillation) EKG today with slight tachycardia, HR 104 w/ afib.  As pt is stable + asymptomatic referring back to cardiology On eliquis Given ED precautions  2. Bradycardia Picked up by apple watch/bp monitor  Tachy/normal in office - EKG 12-Lead   Return if symptoms worsen or fail to improve.      I, Alfredia Ferguson, PA-C have reviewed all documentation for this visit. The documentation on  06/13/23   for the exam, diagnosis, procedures, and orders are all accurate and complete.    Alfredia Ferguson, PA-C  [facility] at [facility] [phone] (phone) [phone] (fax)  [facility]

## 2023-06-13 NOTE — Telephone Encounter (Signed)
Initial Comment Caller states his pulse rate was 42 yesterday. Translation No Nurse Assessment Nurse: Walter Ezzard Standing, RN, Adrienne Date/Time (Eastern Time): 06/13/2023 10:57:47 AM Confirm and document reason for call. If symptomatic, describe symptoms. ---Caller states his pulse rate was 42 yesterday; he states it's been in the 40's for the past five days. He normally runs in the 60's. He denies other symptoms. Currently, pulse is 59, O2SAT is 97%. Does the patient have any new or worsening symptoms? ---Yes Will a triage be completed? ---Yes Related visit to physician within the last 2 weeks? ---No Does the PT have any chronic conditions? (i.e. diabetes, asthma, this includes High risk factors for pregnancy, etc.) ---Yes List chronic conditions. ---A-fib, Parkinsons Is this a behavioral health or substance abuse call? ---No Guidelines Guideline Title Affirmed Question Affirmed Notes Nurse Date/Time (Eastern Time) Heart Rate and Heartbeat Questions Age > 60 years (Exception: Brief heartbeat symptoms that went away and now feels well.) Walter Ezzard Standing, RN, Walter Brown 06/13/2023 11:04:06 AM PLEASE NOTE: All timestamps contained within this report are represented as [place] Standard Time. CONFIDENTIALTY NOTICE: This fax transmission is intended only for the addressee. It contains information that is legally privileged, confidential or otherwise protected from use or disclosure. If you are not the intended recipient, you are strictly prohibited from reviewing, disclosing, copying using or disseminating any of this information or taking any action in reliance on or regarding this information. If you have received this fax in error, please notify us immediately by telephone so that we can arrange for its return to [place]. Phone: [phone], Toll-Free: [phone], Fax: [phone] Page: 2 of 2 Call Id: [id number] Disp. Time Walter Brown Time) Disposition Final User 06/13/2023 11:07:11 AM See HCP  within 4 Hours (or PCP triage) Yes Walter Ezzard Standing, RN, Adrienne Final Disposition 06/13/2023 11:07:11 AM See HCP within 4 Hours (or PCP triage) Yes Walter Ezzard Standing, RN, Walter Brown Disagree/Comply Comply Caller Understands Yes PreDisposition Call Doctor Care Advice Given Per Guideline SEE HCP (OR PCP TRIAGE) WITHIN 4 HOURS: * IF OFFICE WILL BE OPEN: You need to be seen within the next 3 or 4 hours. Call your doctor (or NP/PA) now or as soon as the office opens. CALL BACK IF: * You become worse CARE ADVICE given per Heart Rate and Heartbeat Questions (Adult) guideline. Comments User: Walter Brown, Walter Ezzard Standing, RN Date/Time Walter Brown Time): 06/13/2023 11:09:32 AM Called office for appointment as per directives; appointment set. Referrals REFERRED TO PCP OFFICE

## 2023-06-13 NOTE — Telephone Encounter (Signed)
Called pt and he was scheduled to see Lillia Abed, [place] for low heart rate.

## 2023-06-13 NOTE — Telephone Encounter (Signed)
Home care physician called pt and notified him that his pulse was 41. Pt checked & noticed at rest the bottom is 41 for his pulse. He stated his bp is normal but the heartrate is abnormal. He mentioned he wasn't lightheaded or having any other symptoms but still I transferred pt to speak with triage since the rate is abnormal for him.

## 2023-06-16 ENCOUNTER — Telehealth: Payer: Self-pay | Admitting: Physician Assistant

## 2023-06-16 ENCOUNTER — Ambulatory Visit: Payer: Medicare HMO | Admitting: Physician Assistant

## 2023-06-16 ENCOUNTER — Telehealth: Payer: Self-pay | Admitting: Cardiovascular Disease

## 2023-06-16 NOTE — Telephone Encounter (Signed)
STAT if HR is under 50 or over 120 (normal HR is 60-100 beats per minute)  What is your heart rate 59 ? At this time it is - it has been running in the forties for the past week  Do you have a log of your heart rate readings (document readings)?   Do you have any other symptoms? Not at this time

## 2023-06-16 NOTE — Telephone Encounter (Signed)
Caller stated he is still waiting to hear back about results from MRI

## 2023-06-16 NOTE — Telephone Encounter (Signed)
Wife states patient heart rate was in the low 40's all last week. Today this morning its 59. Patient Denies any chest pain, shortness of breath, fatigue, headache, dizziness nausea or vomiting.   Patient was seen recently at [facility] for the same concerns on 9/6. EKG was done and patient was in AFIB. Patient wanted an appointment because he is going on a cruise Friday. I did advise I did nit think he need an appointment. Will talk with DOD.  Spoke with Dr. Royann Shivers and he reviewed EKG.  Dr. Royann Shivers patient does not need to be seen, patient is on Eliquis. He can monitor his heart rate accurately with [facility] or an apple watch that does EKG.   Patient is aware of provider recommendations and verbalized understanding.

## 2023-06-17 NOTE — Telephone Encounter (Signed)
I will call [facility], radiology room

## 2023-06-17 NOTE — Telephone Encounter (Signed)
Called they will send report over.

## 2023-06-17 NOTE — Telephone Encounter (Signed)
Pt called in and left message with the access nurse. He would like to see if he can get his MRI results

## 2023-06-17 NOTE — Telephone Encounter (Signed)
I advised to patient of results, thanked me for calling.

## 2023-06-17 NOTE — Progress Notes (Signed)
MRA of the head looks god, no occlusion, good circulation, no aneurysm. Thanks

## 2023-06-18 DIAGNOSIS — I10 Essential (primary) hypertension: Secondary | ICD-10-CM | POA: Diagnosis not present

## 2023-06-18 DIAGNOSIS — I2510 Atherosclerotic heart disease of native coronary artery without angina pectoris: Secondary | ICD-10-CM | POA: Diagnosis not present

## 2023-06-18 DIAGNOSIS — J45909 Unspecified asthma, uncomplicated: Secondary | ICD-10-CM | POA: Diagnosis not present

## 2023-06-18 DIAGNOSIS — G20C Parkinsonism, unspecified: Secondary | ICD-10-CM | POA: Diagnosis not present

## 2023-06-18 DIAGNOSIS — E782 Mixed hyperlipidemia: Secondary | ICD-10-CM | POA: Diagnosis not present

## 2023-06-18 DIAGNOSIS — D649 Anemia, unspecified: Secondary | ICD-10-CM | POA: Diagnosis not present

## 2023-06-18 DIAGNOSIS — F411 Generalized anxiety disorder: Secondary | ICD-10-CM | POA: Diagnosis not present

## 2023-06-18 DIAGNOSIS — I480 Paroxysmal atrial fibrillation: Secondary | ICD-10-CM | POA: Diagnosis not present

## 2023-06-18 DIAGNOSIS — M1990 Unspecified osteoarthritis, unspecified site: Secondary | ICD-10-CM | POA: Diagnosis not present

## 2023-06-23 ENCOUNTER — Ambulatory Visit: Payer: PPO | Admitting: Family Medicine

## 2023-07-02 ENCOUNTER — Telehealth: Payer: Self-pay | Admitting: Family Medicine

## 2023-07-02 DIAGNOSIS — I480 Paroxysmal atrial fibrillation: Secondary | ICD-10-CM | POA: Diagnosis not present

## 2023-07-02 DIAGNOSIS — E782 Mixed hyperlipidemia: Secondary | ICD-10-CM | POA: Diagnosis not present

## 2023-07-02 DIAGNOSIS — J45909 Unspecified asthma, uncomplicated: Secondary | ICD-10-CM | POA: Diagnosis not present

## 2023-07-02 DIAGNOSIS — I10 Essential (primary) hypertension: Secondary | ICD-10-CM | POA: Diagnosis not present

## 2023-07-02 DIAGNOSIS — F411 Generalized anxiety disorder: Secondary | ICD-10-CM | POA: Diagnosis not present

## 2023-07-02 DIAGNOSIS — I2510 Atherosclerotic heart disease of native coronary artery without angina pectoris: Secondary | ICD-10-CM | POA: Diagnosis not present

## 2023-07-02 DIAGNOSIS — G20C Parkinsonism, unspecified: Secondary | ICD-10-CM | POA: Diagnosis not present

## 2023-07-02 DIAGNOSIS — M1990 Unspecified osteoarthritis, unspecified site: Secondary | ICD-10-CM | POA: Diagnosis not present

## 2023-07-02 DIAGNOSIS — D649 Anemia, unspecified: Secondary | ICD-10-CM | POA: Diagnosis not present

## 2023-07-02 NOTE — Telephone Encounter (Signed)
She[facility]) called to advise Dr. Abner Greenspan of the following:  Pt had several falls over the past week with no injury. Pt was on a cruise with family and forgot to bring his walker. All falls occurred in the bedroom.

## 2023-07-03 NOTE — Telephone Encounter (Signed)
Called pt lvm checking in about few falls that was reported from home health.  Advised if they need [place] to call the office.

## 2023-07-08 NOTE — Progress Notes (Unsigned)
  Cardiology Office Note   Date:  07/09/2023  ID:  Walter Brown, DOB 1941-08-15, MRN [id number] PCP:  [name], MD [facility] Cardiologist: Nanetta Batty, MD  Reason for visit: 6 month follow-up  History of Present Illness    Walter Brown is a 82 y.o. male with a hx of coronary artery calcifications, PAF, hypertension, hyperlipidemia, TIA, OSA, asthma and Parkinson disease.    Patient last seen by Henreitta Leber in April 2024.  Patient reported intermittent elevated BP.  Patient is continue hydrochlorothiazide and asked to keep BP log.  Today, patient today mentions that he has home health care who noted heart rate in the 40s.  Patient has a smart watch which shows heart range from 40s to 110s with average heart rate in the 60s.  Patient has Parkinson's and is on Sinemet and Depakote.  Patient denies lightheadedness or near syncope.  He has chronic low energy with no recent change.  With his Parkinson's he needs assistance with activities.  He uses a wheelchair and a walker.  Patient denies palpitations.  No bleeding issues on Eliquis.  He denies significant chest pain and shortness of breath.  He has some ankle edema.  He has not used compression socks in the past.   Objective / Physical Exam   EKG today: NSR, nonspecific T wave abnormality HR 72  Vital signs:  BP 136/82   Pulse 72     GEN: No acute distress NECK: No carotid bruits CARDIAC: RRR, no murmurs RESPIRATORY:  Clear to auscultation without rales, wheezing or rhonchi  EXTREMITIES: No edema  Assessment and Plan   Coronary artery calcifications, no angina -Chest CT December 2020 showed heavy coronary artery atherosclerotic calcifications. Normal low risk nuclear stress test July 2019  -No aspirin given need for Eliquis.  Continue lipid therapy.  PAF, asymptomatic -NSR today.  Reassured patient today that avg HR 60s.  No symptomatic bradycardia, normal to have slower heart rates during sleep.    -Cardiac monitor December 2021 showed PACs and short runs of SVT as well as runs of a flutter with variable VR.  -Continue amiodarone 200 mg half tablet daily.  Check thyroid studies and CMET today.  Patient follows with ophthalmologist.  Hypertension, BP controlled -Continue hydrochlorothiazide. -Goal BP is <130/80.  Recommend DASH diet (high in vegetables, fruits, low-fat dairy products, whole grains, poultry, fish, and nuts and low in sweets, sugar-sweetened beverages, and red meats), salt restriction and increase physical activity.  Hyperlipidemia -LDL 71 in February 2024.  Continue Zetia.  Disposition - Follow-up in 6 months with amiodarone monitoring   Signed, Bernette Mayers  07/09/2023 [facility]

## 2023-07-09 ENCOUNTER — Ambulatory Visit: Payer: Medicare HMO | Attending: Physician Assistant | Admitting: Physician Assistant

## 2023-07-09 VITALS — BP 136/82 | HR 72

## 2023-07-09 DIAGNOSIS — I480 Paroxysmal atrial fibrillation: Secondary | ICD-10-CM | POA: Diagnosis not present

## 2023-07-09 DIAGNOSIS — J45909 Unspecified asthma, uncomplicated: Secondary | ICD-10-CM | POA: Diagnosis not present

## 2023-07-09 DIAGNOSIS — I10 Essential (primary) hypertension: Secondary | ICD-10-CM

## 2023-07-09 DIAGNOSIS — I2510 Atherosclerotic heart disease of native coronary artery without angina pectoris: Secondary | ICD-10-CM | POA: Diagnosis not present

## 2023-07-09 DIAGNOSIS — E785 Hyperlipidemia, unspecified: Secondary | ICD-10-CM

## 2023-07-09 DIAGNOSIS — G20C Parkinsonism, unspecified: Secondary | ICD-10-CM | POA: Diagnosis not present

## 2023-07-09 DIAGNOSIS — I4892 Unspecified atrial flutter: Secondary | ICD-10-CM

## 2023-07-09 DIAGNOSIS — E782 Mixed hyperlipidemia: Secondary | ICD-10-CM | POA: Diagnosis not present

## 2023-07-09 DIAGNOSIS — D649 Anemia, unspecified: Secondary | ICD-10-CM | POA: Diagnosis not present

## 2023-07-09 DIAGNOSIS — F411 Generalized anxiety disorder: Secondary | ICD-10-CM | POA: Diagnosis not present

## 2023-07-09 DIAGNOSIS — M1990 Unspecified osteoarthritis, unspecified site: Secondary | ICD-10-CM | POA: Diagnosis not present

## 2023-07-09 NOTE — Patient Instructions (Signed)
Medication Instructions:  No Changes *If you need a refill on your cardiac medications before your next appointment, please call your pharmacy*   Lab Work: CMET, TSH, T4 Free If you have labs (blood work) drawn today and your tests are completely normal, you will receive your results only by: MyChart Message (if you have MyChart) OR A paper copy in the mail If you have any lab test that is abnormal or we need to change your treatment, we will call you to review the results.   Testing/Procedures: No Testing   Follow-Up: At [facility], you and your health needs are our priority.  As part of our continuing mission to provide you with exceptional heart care, we have created designated Provider Care Teams.  These Care Teams include your primary Cardiologist (physician) and Advanced Practice Providers (APPs -  Physician Assistants and Nurse Practitioners) who all work together to provide you with the care you need, when you need it.  We recommend signing up for the patient portal called "MyChart".  Sign up information is provided on this After Visit Summary.  MyChart is used to connect with patients for Virtual Visits (Telemedicine).  Patients are able to view lab/test results, encounter notes, upcoming appointments, etc.  Non-urgent messages can be sent to your provider as well.   To learn more about what you can do with MyChart, go to [email or web address].    Your next appointment:   6 month(s)  Provider:   Nanetta Batty, MD

## 2023-07-10 ENCOUNTER — Telehealth: Payer: Self-pay | Admitting: Family Medicine

## 2023-07-10 DIAGNOSIS — M1990 Unspecified osteoarthritis, unspecified site: Secondary | ICD-10-CM | POA: Diagnosis not present

## 2023-07-10 DIAGNOSIS — I480 Paroxysmal atrial fibrillation: Secondary | ICD-10-CM | POA: Diagnosis not present

## 2023-07-10 DIAGNOSIS — G20C Parkinsonism, unspecified: Secondary | ICD-10-CM | POA: Diagnosis not present

## 2023-07-10 DIAGNOSIS — J45909 Unspecified asthma, uncomplicated: Secondary | ICD-10-CM | POA: Diagnosis not present

## 2023-07-10 DIAGNOSIS — I10 Essential (primary) hypertension: Secondary | ICD-10-CM | POA: Diagnosis not present

## 2023-07-10 DIAGNOSIS — F411 Generalized anxiety disorder: Secondary | ICD-10-CM | POA: Diagnosis not present

## 2023-07-10 DIAGNOSIS — D649 Anemia, unspecified: Secondary | ICD-10-CM | POA: Diagnosis not present

## 2023-07-10 DIAGNOSIS — E782 Mixed hyperlipidemia: Secondary | ICD-10-CM | POA: Diagnosis not present

## 2023-07-10 DIAGNOSIS — I2510 Atherosclerotic heart disease of native coronary artery without angina pectoris: Secondary | ICD-10-CM | POA: Diagnosis not present

## 2023-07-10 LAB — COMPREHENSIVE METABOLIC PANEL
ALT: 19 [IU]/L (ref 0–44)
AST: 17 [IU]/L (ref 0–40)
Albumin: 4.3 g/dL (ref 3.7–4.7)
Alkaline Phosphatase: 67 [IU]/L (ref 44–121)
BUN/Creatinine Ratio: 17 (ref 10–24)
BUN: 17 mg/dL (ref 8–27)
Bilirubin Total: 0.6 mg/dL (ref 0.0–1.2)
CO2: 26 mmol/L (ref 20–29)
Calcium: 9.7 mg/dL (ref 8.6–10.2)
Chloride: 100 mmol/L (ref 96–106)
Creatinine, Ser: 1.01 mg/dL (ref 0.76–1.27)
Globulin, Total: 2.1 g/dL (ref 1.5–4.5)
Glucose: 69 mg/dL — ABNORMAL LOW (ref 70–99)
Potassium: 3.7 mmol/L (ref 3.5–5.2)
Sodium: 140 mmol/L (ref 134–144)
Total Protein: 6.4 g/dL (ref 6.0–8.5)
eGFR: 74 mL/min/{1.73_m2} (ref >59)

## 2023-07-10 LAB — T4, FREE: Free T4: 1.41 ng/dL (ref 0.82–1.77)

## 2023-07-10 LAB — TSH: TSH: 1.68 u[IU]/mL (ref 0.450–4.500)

## 2023-07-10 NOTE — Telephone Encounter (Signed)
Forms placed in providers bin for review.

## 2023-07-10 NOTE — Telephone Encounter (Signed)
Aram Beecham from [employer] called and stated that they faxed over forms on 9/26 for authorization for the pt to receive a new power wheel chair. Aram Beecham stated that she will fax it again today. Please advise with [employer].

## 2023-07-14 ENCOUNTER — Ambulatory Visit (INDEPENDENT_AMBULATORY_CARE_PROVIDER_SITE_OTHER): Payer: Medicare HMO | Admitting: Family

## 2023-07-14 VITALS — BP 132/63 | HR 95 | Temp 98.6°F | Resp 16 | Wt 172.0 lb

## 2023-07-14 DIAGNOSIS — J45909 Unspecified asthma, uncomplicated: Secondary | ICD-10-CM

## 2023-07-14 DIAGNOSIS — R739 Hyperglycemia, unspecified: Secondary | ICD-10-CM | POA: Diagnosis not present

## 2023-07-14 DIAGNOSIS — F329 Major depressive disorder, single episode, unspecified: Secondary | ICD-10-CM

## 2023-07-14 DIAGNOSIS — E782 Mixed hyperlipidemia: Secondary | ICD-10-CM

## 2023-07-14 DIAGNOSIS — R1319 Other dysphagia: Secondary | ICD-10-CM

## 2023-07-14 DIAGNOSIS — N401 Enlarged prostate with lower urinary tract symptoms: Secondary | ICD-10-CM | POA: Diagnosis not present

## 2023-07-14 DIAGNOSIS — H3530 Unspecified macular degeneration: Secondary | ICD-10-CM | POA: Diagnosis not present

## 2023-07-14 DIAGNOSIS — G20A1 Parkinson's disease without dyskinesia, without mention of fluctuations: Secondary | ICD-10-CM

## 2023-07-14 DIAGNOSIS — R350 Frequency of micturition: Secondary | ICD-10-CM

## 2023-07-14 DIAGNOSIS — G4700 Insomnia, unspecified: Secondary | ICD-10-CM | POA: Diagnosis not present

## 2023-07-14 DIAGNOSIS — I10 Essential (primary) hypertension: Secondary | ICD-10-CM

## 2023-07-14 MED ORDER — PANTOPRAZOLE SODIUM 40 MG PO TBEC: 40.0000 mg | DELAYED_RELEASE_TABLET | Freq: Every day | ORAL | Status: AC | PRN

## 2023-07-14 MED ORDER — TAMSULOSIN HCL 0.4 MG PO CAPS: 0.4000 mg | ORAL_CAPSULE | Freq: Every day | ORAL | Status: DC

## 2023-07-14 NOTE — Assessment & Plan Note (Signed)
Notes that his depression is about the same.  Wife feels that he is doing well overall.

## 2023-07-14 NOTE — Assessment & Plan Note (Signed)
Lab Results  Component Value Date   CHOL 153 12/03/2022   HDL 66.80 12/03/2022   LDLCALC 71 12/03/2022   TRIG 76.0 12/03/2022   CHOLHDL 2 12/03/2022   Maintained on Zetia.

## 2023-07-14 NOTE — Assessment & Plan Note (Signed)
BP Readings from Last 3 Encounters:  07/14/23 132/63  07/09/23 136/82  06/13/23 136/82   Stable on hydrochlorothiazide.  Continue same.

## 2023-07-14 NOTE — Assessment & Plan Note (Signed)
Lab Results  Component Value Date   HGBA1C 5.2 12/03/2022   Last A1C normal.

## 2023-07-14 NOTE — Assessment & Plan Note (Signed)
Uses a walker at home, has had recurrent falls.  He is maintained on sinemet.  Has not had much improvement with PT but he is receiving home health PT/OT.

## 2023-07-14 NOTE — Assessment & Plan Note (Signed)
Reports that he sleeps well.

## 2023-07-14 NOTE — Progress Notes (Signed)
Subjective:     Patient ID: Walter Brown, male    DOB: 11/28/1940, 82 y.o.   MRN: [id number]  Chief Complaint  Patient presents with   Hypertension    Here for follow up   Dizziness    Complains of dizziness   Fall    Patient reports falling 1 to 2 times a week, "no injuries"    Hypertension Pertinent negatives include no chest pain, malaise/fatigue, neck pain or shortness of breath.  Dizziness Pertinent negatives include no chest pain, coughing, fever, nausea, neck pain, sore throat or vomiting.  Fall Pertinent negatives include no fever, nausea or vomiting.    Discussed the use of AI scribe software for clinical note transcription with the patient, who gave verbal consent to proceed.   Patient is an 82 year old male that present to the clinic today for hypertension follow up, dizziness and falls. Issues of dizziness for the past 2-3 years. He states it is about the same. Patient states if he turns his head to the left or right it stays the same.   Paitent states he has fallen a lot recently and it feels like his muscles in his leg just give way.   Just saw [place] in office last month for his low heart rate that happens at times and went to see Cardiology but they did not seem concerned   No injuries when he has fallen. Patient states that the falls occur when he is trying to transfer. Patient is on Eliquis so I did educate him on being careful in regards to falls and being on a blood thinner.   No changes to diet that he has noticed in the past.  Health Maintenance Due  Topic Date Due   COVID-19 Vaccine (8 - 2023-24 season) 06/08/2023    Past Medical History:  Diagnosis Date   Allergy    Anemia    mild   Arthritis 04/06/2017   Asthma    childhood   ASVD (arteriosclerotic vascular disease) 09/07/2018   Back pain with radiation 04/26/2013   Low back with LLE radiculopathy   Basal cell carcinoma    skin- on nose- basal cell (20 yrs ago) forehead 1 year ago     Sees  Dr Elmon Else of dermatology   BPH (benign prostatic hyperplasia)    Bradycardia 06/28/2018   Cataract 10/07/2014   Cerumen impaction 08/10/2012   Chicken pox as child   Coronary artery calcification 11/23/2019   Dysphagia 09/11/2021   Elevated LFTs    Essential hypertension    ETD (Eustachian tube dysfunction), right 01/17/2022   Generalized anxiety disorder    [place] measles as a child   Hemoptysis 01/17/2022   Hyperglycemia 04/25/2020   Hyperlipidemia    Insomnia 02/06/2021   Internal hemorrhoids    Irregular cardiac rhythm 03/10/2018   Lipoma 08/04/2018   Macular degeneration    Major depressive disorder 08/01/2012   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   Nocturia 12/04/2022   Obstructive sleep apnea 11/09/2018   Patient reports mild symptoms; was not prescribed a CPAP machine   Otitis externa 08/10/2012   PAF (paroxysmal atrial fibrillation)    Palpitations 09/07/2018   Parkinson's disease 05/19/2019   Recurrent falls 12/04/2022   Thrombocytopenia    TIA (transient ischemic attack) 03/10/2018   Unsteady gait 09/11/2021    Past Surgical History:  Procedure Laterality Date   BELPHAROPTOSIS REPAIR     very young, b/l   EXCISIONAL HEMORRHOIDECTOMY  EYE SURGERY  2017   eyelids   HYDROCELE EXCISION / REPAIR  2012   b/l   SKIN CANCER EXCISION     nose and forehead, basal cell CA   UPPER GASTROINTESTINAL ENDOSCOPY      Family History  Problem Relation Age of Onset   Hypertension Mother    Other Mother        MRSA   Heart disease Mother        stents   ADD / ADHD Mother    Arthritis Mother    Asthma Mother    Depression Mother    Vision loss Mother    Heart disease Father    Hypertension Father    Prostate cancer Father 69   Parkinson's disease Father    Cancer Father 50   Hearing loss Father    Stroke Father    Vision loss Father    Varicose Veins Father    Stroke Maternal Grandmother    Prostate cancer Maternal  Grandfather    Asthma Maternal Grandfather    Cancer Maternal Grandfather    Cancer Paternal Grandfather        EYE   Hearing loss Paternal Grandfather    ADD / ADHD Son        ADHD   Other Son 24       part of 1 lung removed- due to infection   Alcohol abuse Son    Depression Son    Alcohol abuse Son    Alcohol abuse Maternal Uncle    Depression Maternal Uncle    Asthma Maternal Uncle    Heart disease Paternal Uncle    Colon cancer Neg Hx    Esophageal cancer Neg Hx    Rectal cancer Neg Hx    Stomach cancer Neg Hx     Social History   Socioeconomic History   Marital status: Married    Spouse name: Not on file   Number of children: 2   Years of education: 16   Highest education level: Bachelor's degree (e.g., BA, AB, BS)  Occupational History   Occupation: Retired  Tobacco Use   Smoking status: Former    Current packs/day: 0.00    Average packs/day: 1.5 packs/day for 15.0 years (22.5 ttl pk-yrs)    Types: Cigarettes    Start date: 10/06/1978    Quit date: 10/07/1978    Years since quitting: 44.7   Smokeless tobacco: Never   Tobacco comments:    QUIT 1980  Vaping Use   Vaping status: Never Used  Substance and Sexual Activity   Alcohol use: Yes    Alcohol/week: 7.0 standard drinks of alcohol    Types: 7 Glasses of wine per week    Comment: wine with meals   Drug use: Not Currently   Sexual activity: Yes    Comment: lives with wife, 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  Other Topics Concern   Not on file  Social History Narrative   Right handed    Occasionally caffeine   Retired   Lives with wife   Social Determinants of Health   Financial Resource Strain: Low Risk  (03/26/2022)   Overall Financial Resource Strain (CARDIA)    Difficulty of Paying Living Expenses: Not hard at all  Food Insecurity: No Food Insecurity (04/01/2023)   Hunger Vital Sign    Worried About Running Out of Food in the Last Year:  Never true    Ran Out of  Food in the Last Year: Never true  Transportation Needs: No Transportation Needs (04/01/2023)   PRAPARE - Administrator, Civil Service (Medical): No    Lack of Transportation (Non-Medical): No  Physical Activity: Sufficiently Active (03/26/2022)   Exercise Vital Sign    Days of Exercise per Week: 5 days    Minutes of Exercise per Session: 50 min  Stress: Stress Concern Present (03/26/2022)   [employer] of Occupational Health - Occupational Stress Questionnaire    Feeling of Stress : To some extent  Social Connections: Moderately Isolated (03/26/2022)   Social Connection and Isolation Panel [NHANES]    Frequency of Communication with Friends and Family: More than three times a week    Frequency of Social Gatherings with Friends and Family: Once a week    Attends Religious Services: Never    Database administrator or Organizations: No    Attends Banker Meetings: Never    Marital Status: Married  Catering manager Violence: Not At Risk (03/26/2022)   Humiliation, Afraid, Rape, and Kick questionnaire    Fear of Current or Ex-Partner: No    Emotionally Abused: No    Physically Abused: No    Sexually Abused: No    Outpatient Medications Prior to Visit  Medication Sig Dispense Refill   amiodarone (PACERONE) 200 MG tablet TAKE 1/2 TABLET BY MOUTH DAILY 45 tablet 3   calcium-vitamin D (OSCAL WITH D) 500-5 MG-MCG tablet Take 1 tablet by mouth.     carbidopa-levodopa (SINEMET CR) 50-200 MG tablet TAKE 1 TABLET BY MOUTH EVERYDAY AT BEDTIME 90 tablet 0   carbidopa-levodopa (SINEMET IR) 25-100 MG tablet TAKE 1 TABLET BY MOUTH 3 (THREE) TIMES DAILY. 7AM/11AM/4PM Can take an extra prn 270 tablet 0   divalproex (DEPAKOTE) 125 MG DR tablet Take 2 tablets (250 mg total) by mouth daily. 180 tablet 0   ELIQUIS 5 MG TABS tablet TAKE 1 TABLET BY MOUTH TWICE A DAY 180 tablet 1   ezetimibe (ZETIA) 10 MG tablet TAKE 1 TABLET BY MOUTH EVERY DAY 90 tablet  3   hydrochlorothiazide (HYDRODIURIL) 25 MG tablet TAKE 1 TABLET BY MOUTH EVERY DAY 90 tablet 1   Melatonin 10 MG TABS Take 10 mg by mouth daily as needed.     mirabegron ER (MYRBETRIQ) 50 MG TB24 tablet Take 50 mg by mouth daily.     Multiple Vitamins-Minerals (ICAPS AREDS 2 PO) Take 1 capsule by mouth in the morning and at bedtime.     pantoprazole (PROTONIX) 40 MG tablet TAKE 1 TABLET BY MOUTH EVERY DAY 90 tablet 0   sertraline (ZOLOFT) 100 MG tablet Take 1 tablet (100 mg total) by mouth daily. 90 tablet 1   No facility-administered medications prior to visit.    Allergies  Allergen Reactions   Albuterol Sulfate Palpitations   Azithromycin Other (See Comments)    Hepatotoxicity   Doxycycline Hyclate Other (See Comments)    Taken with Azithromycin and had Heaptotoxicity   Doxycycline Hyclate Other (See Comments)    Taken with Azithromycin and had Hepatotoxicity   Doxycycline     Taken with Azithromycin and had Hepatotoxicity.    Review of Systems  Constitutional:  Negative for fever and malaise/fatigue.  HENT:  Negative for ear pain, hearing loss, nosebleeds, sinus pain and sore throat.   Eyes:  Negative for pain.  Respiratory:  Negative for cough, shortness of breath and wheezing.   Cardiovascular:  Negative for chest pain.  Gastrointestinal:  Negative  for diarrhea, nausea and vomiting.  Genitourinary:  Negative for urgency.  Musculoskeletal:  Negative for neck pain.  Neurological:  Positive for dizziness.  Psychiatric/Behavioral:  Negative for depression and suicidal ideas.        Objective:    Physical Exam Constitutional:      Appearance: Normal appearance.  HENT:     Head: Normocephalic.     Nose: Nose normal.  Cardiovascular:     Rate and Rhythm: Normal rate and regular rhythm.     Pulses: Normal pulses.     Heart sounds: Normal heart sounds.  Pulmonary:     Effort: Pulmonary effort is normal.     Breath sounds: Normal breath sounds.  Musculoskeletal:      Cervical back: Normal range of motion.  Skin:    General: Skin is warm.     Capillary Refill: Capillary refill takes less than 2 seconds.  Neurological:     General: No focal deficit present.     Mental Status: He is alert and oriented to person, place, and time. Mental status is at baseline.      BP 132/63 (BP Location: Right Arm, Patient Position: Sitting, Cuff Size: Normal)   Pulse 95   Temp 98.6 F (37 C) (Oral)   Resp 16   Wt 172 lb (78 kg)   SpO2 96%   BMI 28.62 kg/m  Wt Readings from Last 3 Encounters:  07/14/23 172 lb (78 kg)  06/13/23 170 lb (77.1 kg)  04/17/23 174 lb 6.4 oz (79.1 kg)       Assessment & Plan:   Problem List Items Addressed This Visit   None   I am having Walter Brown maintain his Multiple Vitamins-Minerals (ICAPS AREDS 2 PO), Melatonin, amiodarone, calcium-vitamin D, mirabegron ER, pantoprazole, sertraline, ezetimibe, Eliquis, divalproex, carbidopa-levodopa, carbidopa-levodopa, and hydrochlorothiazide.  No orders of the defined types were placed in this encounter.

## 2023-07-14 NOTE — Telephone Encounter (Signed)
Documents/orders signed, faxed to [employer].

## 2023-07-14 NOTE — Assessment & Plan Note (Signed)
Reports stable and follows with eye doctor regularly.  He reports that he has been receiving eye injections.

## 2023-07-14 NOTE — Assessment & Plan Note (Signed)
As a child.  No symptoms since age 82.

## 2023-07-14 NOTE — Progress Notes (Signed)
Subjective:     Patient ID: Walter Brown, male    DOB: 11/21/1940, 82 y.o.   MRN: [id number]  Chief Complaint  Patient presents with   Hypertension    Here for follow up   Dizziness    Complains of dizziness   Fall    Patient reports falling 1 to 2 times a week, "no injuries"    Hypertension  Dizziness  Fall    Discussed the use of AI scribe software for clinical note transcription with the patient, who gave verbal consent to proceed.  History of Present Illness   The patient, with a known diagnosis of Parkinson's disease, reports a perceived worsening of his condition, particularly with regards to balance. He uses a walker for mobility within his home environment. He reports occasional tremors in the right leg, but does not consider this a significant issue. The patient has a history of falls, which has been communicated to his neurologist. He has previously engaged in physical therapy for balance, but did not find it particularly beneficial. Currently, he receives home care, including visits from physical and occupational therapists.  The patient also reports frequent urination in small amounts. He is under the care of a urologist and is on two medications for urinary issues, including Myrbetriq and Tamsulosin.  The patient has a history of asthma, which he reports as stable, and macular degeneration, for which he receives regular treatments. He also has a history of depression, which he and his family member do not consider a significant issue at present.          Health Maintenance Due  Topic Date Due   COVID-19 Vaccine (8 - 2023-24 season) 06/08/2023    Past Medical History:  Diagnosis Date   Allergy    Anemia    mild   Arthritis 04/06/2017   Asthma    childhood   ASVD (arteriosclerotic vascular disease) 09/07/2018   Back pain with radiation 04/26/2013   Low back with LLE radiculopathy   Basal cell carcinoma    skin- on nose- basal cell (20 yrs ago)  forehead 1 year ago     Sees Dr Elmon Else of dermatology   BPH (benign prostatic hyperplasia)    Bradycardia 06/28/2018   Cataract 10/07/2014   Cerumen impaction 08/10/2012   Chicken pox as child   Coronary artery calcification 11/23/2019   Dysphagia 09/11/2021   Elevated LFTs    Essential hypertension    ETD (Eustachian tube dysfunction), right 01/17/2022   Generalized anxiety disorder    [place] measles as a child   Hemoptysis 01/17/2022   Hyperglycemia 04/25/2020   Hyperlipidemia    Insomnia 02/06/2021   Internal hemorrhoids    Irregular cardiac rhythm 03/10/2018   Lipoma 08/04/2018   Macular degeneration    Major depressive disorder 08/01/2012   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   Nocturia 12/04/2022   Obstructive sleep apnea 11/09/2018   Patient reports mild symptoms; was not prescribed a CPAP machine   Otitis externa 08/10/2012   PAF (paroxysmal atrial fibrillation)    Palpitations 09/07/2018   Parkinson's disease 05/19/2019   Recurrent falls 12/04/2022   Thrombocytopenia    TIA (transient ischemic attack) 03/10/2018   Unsteady gait 09/11/2021    Past Surgical History:  Procedure Laterality Date   BELPHAROPTOSIS REPAIR     very young, b/l   EXCISIONAL HEMORRHOIDECTOMY     EYE SURGERY  2017   eyelids   HYDROCELE EXCISION / REPAIR  2012   b/l   SKIN CANCER EXCISION     nose and forehead, basal cell CA   UPPER GASTROINTESTINAL ENDOSCOPY      Family History  Problem Relation Age of Onset   Hypertension Mother    Other Mother        MRSA   Heart disease Mother        stents   ADD / ADHD Mother    Arthritis Mother    Asthma Mother    Depression Mother    Vision loss Mother    Heart disease Father    Hypertension Father    Prostate cancer Father 13   Parkinson's disease Father    Cancer Father 55   Hearing loss Father    Stroke Father    Vision loss Father    Varicose Veins Father    Stroke Maternal Grandmother     Prostate cancer Maternal Grandfather    Asthma Maternal Grandfather    Cancer Maternal Grandfather    Cancer Paternal Grandfather        EYE   Hearing loss Paternal Grandfather    ADD / ADHD Son        ADHD   Other Son 24       part of 1 lung removed- due to infection   Alcohol abuse Son    Depression Son    Alcohol abuse Son    Alcohol abuse Maternal Uncle    Depression Maternal Uncle    Asthma Maternal Uncle    Heart disease Paternal Uncle    Colon cancer Neg Hx    Esophageal cancer Neg Hx    Rectal cancer Neg Hx    Stomach cancer Neg Hx     Social History   Socioeconomic History   Marital status: Married    Spouse name: Not on file   Number of children: 2   Years of education: 16   Highest education level: Bachelor's degree (e.g., BA, AB, BS)  Occupational History   Occupation: Retired  Tobacco Use   Smoking status: Former    Current packs/day: 0.00    Average packs/day: 1.5 packs/day for 15.0 years (22.5 ttl pk-yrs)    Types: Cigarettes    Start date: 10/06/1978    Quit date: 10/07/1978    Years since quitting: 44.7   Smokeless tobacco: Never   Tobacco comments:    QUIT 1980  Vaping Use   Vaping status: Never Used  Substance and Sexual Activity   Alcohol use: Yes    Alcohol/week: 7.0 standard drinks of alcohol    Types: 7 Glasses of wine per week    Comment: wine with meals   Drug use: Not Currently   Sexual activity: Yes    Comment: lives with wife, 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  Other Topics Concern   Not on file  Social History Narrative   Right handed    Occasionally caffeine   Retired   Lives with wife   Social Determinants of Health   Financial Resource Strain: Low Risk  (03/26/2022)   Overall Financial Resource Strain (CARDIA)    Difficulty of Paying Living Expenses: Not hard at all  Food Insecurity: No Food Insecurity (04/01/2023)   Hunger Vital Sign    Worried About Running Out  of Food in the Last Year: Never true    Ran Out of Food in the Last Year: Never true  Transportation Needs: No Transportation Needs (04/01/2023)  PRAPARE - Administrator, Civil Service (Medical): No    Lack of Transportation (Non-Medical): No  Physical Activity: Sufficiently Active (03/26/2022)   Exercise Vital Sign    Days of Exercise per Week: 5 days    Minutes of Exercise per Session: 50 min  Stress: Stress Concern Present (03/26/2022)   [employer] of Occupational Health - Occupational Stress Questionnaire    Feeling of Stress : To some extent  Social Connections: Moderately Isolated (03/26/2022)   Social Connection and Isolation Panel [NHANES]    Frequency of Communication with Friends and Family: More than three times a week    Frequency of Social Gatherings with Friends and Family: Once a week    Attends Religious Services: Never    Database administrator or Organizations: No    Attends Banker Meetings: Never    Marital Status: Married  Catering manager Violence: Not At Risk (03/26/2022)   Humiliation, Afraid, Rape, and Kick questionnaire    Fear of Current or Ex-Partner: No    Emotionally Abused: No    Physically Abused: No    Sexually Abused: No    Outpatient Medications Prior to Visit  Medication Sig Dispense Refill   amiodarone (PACERONE) 200 MG tablet TAKE 1/2 TABLET BY MOUTH DAILY 45 tablet 3   calcium-vitamin D (OSCAL WITH D) 500-5 MG-MCG tablet Take 1 tablet by mouth.     carbidopa-levodopa (SINEMET CR) 50-200 MG tablet TAKE 1 TABLET BY MOUTH EVERYDAY AT BEDTIME 90 tablet 0   carbidopa-levodopa (SINEMET IR) 25-100 MG tablet TAKE 1 TABLET BY MOUTH 3 (THREE) TIMES DAILY. 7AM/11AM/4PM Can take an extra prn 270 tablet 0   divalproex (DEPAKOTE) 125 MG DR tablet Take 2 tablets (250 mg total) by mouth daily. 180 tablet 0   ELIQUIS 5 MG TABS tablet TAKE 1 TABLET BY MOUTH TWICE A DAY 180 tablet 1   ezetimibe (ZETIA) 10 MG tablet TAKE 1 TABLET BY  MOUTH EVERY DAY 90 tablet 3   hydrochlorothiazide (HYDRODIURIL) 25 MG tablet TAKE 1 TABLET BY MOUTH EVERY DAY 90 tablet 1   Melatonin 10 MG TABS Take 10 mg by mouth daily as needed.     mirabegron ER (MYRBETRIQ) 50 MG TB24 tablet Take 50 mg by mouth daily.     Multiple Vitamins-Minerals (ICAPS AREDS 2 PO) Take 1 capsule by mouth in the morning and at bedtime.     sertraline (ZOLOFT) 100 MG tablet Take 1 tablet (100 mg total) by mouth daily. 90 tablet 1   pantoprazole (PROTONIX) 40 MG tablet TAKE 1 TABLET BY MOUTH EVERY DAY 90 tablet 0   No facility-administered medications prior to visit.    Allergies  Allergen Reactions   Albuterol Sulfate Palpitations   Azithromycin Other (See Comments)    Hepatotoxicity   Doxycycline Hyclate Other (See Comments)    Taken with Azithromycin and had Heaptotoxicity   Doxycycline Hyclate Other (See Comments)    Taken with Azithromycin and had Hepatotoxicity   Doxycycline     Taken with Azithromycin and had Hepatotoxicity.    Review of Systems  Neurological:  Positive for dizziness.   See HPI    Objective:    Physical Exam Constitutional:      General: He is not in acute distress.    Appearance: He is well-developed.     Comments: Elderly white male seated in wheelchair  HENT:     Head: Normocephalic and atraumatic.  Cardiovascular:     Rate and Rhythm:  Normal rate and regular rhythm.     Heart sounds: No murmur heard. Pulmonary:     Effort: Pulmonary effort is normal. No respiratory distress.     Breath sounds: Normal breath sounds. No wheezing or rales.  Skin:    General: Skin is warm and dry.  Neurological:     Mental Status: He is alert and oriented to person, place, and time.  Psychiatric:        Behavior: Behavior normal.        Thought Content: Thought content normal.      BP 132/63 (BP Location: Right Arm, Patient Position: Sitting, Cuff Size: Normal)   Pulse 95   Temp 98.6 F (37 C) (Oral)   Resp 16   Wt 172 lb (78  kg)   SpO2 96%   BMI 28.62 kg/m  Wt Readings from Last 3 Encounters:  07/14/23 172 lb (78 kg)  06/13/23 170 lb (77.1 kg)  04/17/23 174 lb 6.4 oz (79.1 kg)       Assessment & Plan:   Problem List Items Addressed This Visit       Unprioritized   Parkinson's disease (HCC) - Primary    Uses a walker at home, has had recurrent falls.  He is maintained on sinemet.  Has not had much improvement with PT but he is receiving home health PT/OT.        Major depressive disorder    Notes that his depression is about the same.  Wife feels that he is doing well overall.        Macular degeneration    Reports stable and follows with eye doctor regularly.  He reports that he has been receiving eye injections.        Insomnia    Reports that he sleeps well.       Hyperlipidemia    Lab Results  Component Value Date   CHOL 153 12/03/2022   HDL 66.80 12/03/2022   LDLCALC 71 12/03/2022   TRIG 76.0 12/03/2022   CHOLHDL 2 12/03/2022   Maintained on Zetia.         Hyperglycemia    Lab Results  Component Value Date   HGBA1C 5.2 12/03/2022   Last A1C normal.       Essential hypertension    BP Readings from Last 3 Encounters:  07/14/23 132/63  07/09/23 136/82  06/13/23 136/82   Stable on hydrochlorothiazide.  Continue same.      Dysphagia    Currently stable per patienty      BPH (benign prostatic hyperplasia)   Relevant Medications   tamsulosin (FLOMAX) 0.4 MG CAPS capsule   Asthma    As a child.  No symptoms since age 25.         I have changed Odyn O. Mannella's pantoprazole. I am also having him start on tamsulosin. Additionally, I am having him maintain his Multiple Vitamins-Minerals (ICAPS AREDS 2 PO), Melatonin, amiodarone, calcium-vitamin D, mirabegron ER, sertraline, ezetimibe, Eliquis, divalproex, carbidopa-levodopa, carbidopa-levodopa, and hydrochlorothiazide.  Meds ordered this encounter  Medications   tamsulosin (FLOMAX) 0.4 MG CAPS capsule    Sig:  Take 1 capsule (0.4 mg total) by mouth daily.    Order Specific Question:   Supervising Provider    Answer:   Danise Edge A [[id number]   pantoprazole (PROTONIX) 40 MG tablet    Sig: Take 1 tablet (40 mg total) by mouth daily as needed.    Order Specific Question:   Supervising Provider  Answer:   Danise Edge A [id number]

## 2023-07-14 NOTE — Assessment & Plan Note (Signed)
Currently stable per patienty

## 2023-07-14 NOTE — Patient Instructions (Signed)
VISIT SUMMARY:  During your visit, we discussed your Parkinson's disease, benign prostatic hyperplasia, bradycardia, macular degeneration, asthma, hyperlipidemia, gastroesophageal reflux disease, and mild peripheral edema. We also reviewed your general health maintenance and follow-up plans.  YOUR PLAN:  -PARKINSON'S DISEASE: Parkinson's disease is a disorder of the nervous system that affects movement. Your balance issues and mild cognitive impairment are part of this condition. You should continue your current management and follow-up with Dr. Arbutus Leas.  -BENIGN PROSTATIC HYPERPLASIA: Benign prostatic hyperplasia is an enlarged prostate gland. It's causing your frequent urination in small amounts. Continue taking Myrbetriq and Tamsulosin 0.4mg , and follow-up with Dr. Marlou Porch at [facility].  -BRADYCARDIA: Bradycardia is a slower than normal heart rate. Your average heart rate is in the 60's. You are not having any symptoms of low heart rate. Please continue to follow with cardiology.   -MACULAR DEGENERATION: Macular degeneration is an eye disorder that affects your central vision. Your condition is stable with regular treatments every 12-14 weeks. Continue your current management and treatments.  -HYPERLIPIDEMIA: Hyperlipidemia is an abnormally high concentration of fats or lipids in the blood. Your last cholesterol level looked good. Continue taking Zetia as prescribed.  -GASTROESOPHAGEAL REFLUX DISEASE: Gastroesophageal reflux disease (GERD) is a chronic condition where stomach acid frequently flows back into the tube connecting your mouth and stomach. You report that this has been well controlled even without pantoprazole. You may continue taking Pantoprazole as needed.  -MILD PERIPHERAL EDEMA: Mild peripheral edema is swelling in your right ankle. Elevate your ankle when possible and consider compression stockings if discomfort develops.  INSTRUCTIONS:  Your flu shot and COVID booster are  up to date. Please return for a follow-up in 3-4 months.

## 2023-07-15 ENCOUNTER — Telehealth: Payer: Self-pay | Admitting: Family Medicine

## 2023-07-15 NOTE — Telephone Encounter (Signed)
Caller/Agency: [facility]  Callback Number: [phone] (ok to lvm) Requesting OT/PT/Skilled Nursing/Social Work/Speech Therapy: Speech Therapy evaluation Frequency: moved from 10/6 to 10/13 due to patient scheduling conflict

## 2023-07-15 NOTE — Telephone Encounter (Signed)
Called Walter Brown lvm for OT/PT  Caller/Agency: [facility]  Callback Number: [phone] (ok to lvm) Requesting OT/PT/Skilled Nursing/Social Work/Speech Therapy: Speech Therapy evaluation

## 2023-07-16 ENCOUNTER — Telehealth: Payer: Self-pay

## 2023-07-16 DIAGNOSIS — D649 Anemia, unspecified: Secondary | ICD-10-CM | POA: Diagnosis not present

## 2023-07-16 DIAGNOSIS — J45909 Unspecified asthma, uncomplicated: Secondary | ICD-10-CM | POA: Diagnosis not present

## 2023-07-16 DIAGNOSIS — M1990 Unspecified osteoarthritis, unspecified site: Secondary | ICD-10-CM | POA: Diagnosis not present

## 2023-07-16 DIAGNOSIS — G20B1 Parkinson's disease with dyskinesia, without mention of fluctuations: Secondary | ICD-10-CM | POA: Diagnosis not present

## 2023-07-16 DIAGNOSIS — I2510 Atherosclerotic heart disease of native coronary artery without angina pectoris: Secondary | ICD-10-CM | POA: Diagnosis not present

## 2023-07-16 DIAGNOSIS — I10 Essential (primary) hypertension: Secondary | ICD-10-CM | POA: Diagnosis not present

## 2023-07-16 DIAGNOSIS — E782 Mixed hyperlipidemia: Secondary | ICD-10-CM | POA: Diagnosis not present

## 2023-07-16 DIAGNOSIS — I480 Paroxysmal atrial fibrillation: Secondary | ICD-10-CM | POA: Diagnosis not present

## 2023-07-16 DIAGNOSIS — F411 Generalized anxiety disorder: Secondary | ICD-10-CM | POA: Diagnosis not present

## 2023-07-16 NOTE — Telephone Encounter (Addendum)
Called patient regarding results. Patient had understanding of results.----- Message from Cannon Kettle sent at 07/15/2023  8:06 PM EDT ----- Normal kidney function, liver function and electrolytes.  Normal thyroid function. Safe to continue low-dose amiodarone.

## 2023-07-23 ENCOUNTER — Telehealth: Payer: Self-pay

## 2023-07-23 ENCOUNTER — Telehealth: Payer: Self-pay | Admitting: Family Medicine

## 2023-07-23 DIAGNOSIS — G20B1 Parkinson's disease with dyskinesia, without mention of fluctuations: Secondary | ICD-10-CM | POA: Diagnosis not present

## 2023-07-23 DIAGNOSIS — M1990 Unspecified osteoarthritis, unspecified site: Secondary | ICD-10-CM | POA: Diagnosis not present

## 2023-07-23 DIAGNOSIS — E782 Mixed hyperlipidemia: Secondary | ICD-10-CM | POA: Diagnosis not present

## 2023-07-23 DIAGNOSIS — J45909 Unspecified asthma, uncomplicated: Secondary | ICD-10-CM | POA: Diagnosis not present

## 2023-07-23 DIAGNOSIS — F411 Generalized anxiety disorder: Secondary | ICD-10-CM | POA: Diagnosis not present

## 2023-07-23 DIAGNOSIS — D649 Anemia, unspecified: Secondary | ICD-10-CM | POA: Diagnosis not present

## 2023-07-23 DIAGNOSIS — I480 Paroxysmal atrial fibrillation: Secondary | ICD-10-CM | POA: Diagnosis not present

## 2023-07-23 DIAGNOSIS — I2510 Atherosclerotic heart disease of native coronary artery without angina pectoris: Secondary | ICD-10-CM | POA: Diagnosis not present

## 2023-07-23 DIAGNOSIS — I10 Essential (primary) hypertension: Secondary | ICD-10-CM | POA: Diagnosis not present

## 2023-07-23 NOTE — Telephone Encounter (Signed)
[facility] called to state patient has had repeated falls. B/P is good and patient is using the U-Step but therapist is saying that patient falling is getting really bad

## 2023-07-23 NOTE — Telephone Encounter (Signed)
Caller/Agency: [facility]) Callback Number: [phone] Requesting OT/PT/Skilled Nursing/Social Work/Speech Therapy: Speech Therapy Frequency: Evaluation moved to 10.21.2024 per pt scheduling conflict

## 2023-07-24 DIAGNOSIS — M1990 Unspecified osteoarthritis, unspecified site: Secondary | ICD-10-CM | POA: Diagnosis not present

## 2023-07-24 DIAGNOSIS — D649 Anemia, unspecified: Secondary | ICD-10-CM | POA: Diagnosis not present

## 2023-07-24 DIAGNOSIS — G20B1 Parkinson's disease with dyskinesia, without mention of fluctuations: Secondary | ICD-10-CM | POA: Diagnosis not present

## 2023-07-24 DIAGNOSIS — I480 Paroxysmal atrial fibrillation: Secondary | ICD-10-CM | POA: Diagnosis not present

## 2023-07-24 DIAGNOSIS — F411 Generalized anxiety disorder: Secondary | ICD-10-CM | POA: Diagnosis not present

## 2023-07-24 DIAGNOSIS — J45909 Unspecified asthma, uncomplicated: Secondary | ICD-10-CM | POA: Diagnosis not present

## 2023-07-24 DIAGNOSIS — I2510 Atherosclerotic heart disease of native coronary artery without angina pectoris: Secondary | ICD-10-CM | POA: Diagnosis not present

## 2023-07-24 DIAGNOSIS — I10 Essential (primary) hypertension: Secondary | ICD-10-CM | POA: Diagnosis not present

## 2023-07-24 DIAGNOSIS — E782 Mixed hyperlipidemia: Secondary | ICD-10-CM | POA: Diagnosis not present

## 2023-07-24 NOTE — Telephone Encounter (Signed)
Called patients wife and left detailed message. Asked her to call me back with any questions

## 2023-07-29 DIAGNOSIS — J45909 Unspecified asthma, uncomplicated: Secondary | ICD-10-CM | POA: Diagnosis not present

## 2023-07-29 DIAGNOSIS — G20B1 Parkinson's disease with dyskinesia, without mention of fluctuations: Secondary | ICD-10-CM | POA: Diagnosis not present

## 2023-07-29 DIAGNOSIS — H3530 Unspecified macular degeneration: Secondary | ICD-10-CM

## 2023-07-29 DIAGNOSIS — E782 Mixed hyperlipidemia: Secondary | ICD-10-CM | POA: Diagnosis not present

## 2023-07-29 DIAGNOSIS — M1990 Unspecified osteoarthritis, unspecified site: Secondary | ICD-10-CM | POA: Diagnosis not present

## 2023-07-29 DIAGNOSIS — I2510 Atherosclerotic heart disease of native coronary artery without angina pectoris: Secondary | ICD-10-CM | POA: Diagnosis not present

## 2023-07-29 DIAGNOSIS — G3184 Mild cognitive impairment, so stated: Secondary | ICD-10-CM

## 2023-07-29 DIAGNOSIS — I480 Paroxysmal atrial fibrillation: Secondary | ICD-10-CM | POA: Diagnosis not present

## 2023-07-29 DIAGNOSIS — F411 Generalized anxiety disorder: Secondary | ICD-10-CM | POA: Diagnosis not present

## 2023-07-29 DIAGNOSIS — D649 Anemia, unspecified: Secondary | ICD-10-CM | POA: Diagnosis not present

## 2023-07-29 DIAGNOSIS — F339 Major depressive disorder, recurrent, unspecified: Secondary | ICD-10-CM

## 2023-07-29 DIAGNOSIS — I10 Essential (primary) hypertension: Secondary | ICD-10-CM | POA: Diagnosis not present

## 2023-07-30 DIAGNOSIS — E782 Mixed hyperlipidemia: Secondary | ICD-10-CM | POA: Diagnosis not present

## 2023-07-30 DIAGNOSIS — M1990 Unspecified osteoarthritis, unspecified site: Secondary | ICD-10-CM | POA: Diagnosis not present

## 2023-07-30 DIAGNOSIS — I10 Essential (primary) hypertension: Secondary | ICD-10-CM | POA: Diagnosis not present

## 2023-07-30 DIAGNOSIS — J45909 Unspecified asthma, uncomplicated: Secondary | ICD-10-CM | POA: Diagnosis not present

## 2023-07-30 DIAGNOSIS — G20B1 Parkinson's disease with dyskinesia, without mention of fluctuations: Secondary | ICD-10-CM | POA: Diagnosis not present

## 2023-07-30 DIAGNOSIS — I480 Paroxysmal atrial fibrillation: Secondary | ICD-10-CM | POA: Diagnosis not present

## 2023-07-30 DIAGNOSIS — D649 Anemia, unspecified: Secondary | ICD-10-CM | POA: Diagnosis not present

## 2023-07-30 DIAGNOSIS — I2510 Atherosclerotic heart disease of native coronary artery without angina pectoris: Secondary | ICD-10-CM | POA: Diagnosis not present

## 2023-07-30 DIAGNOSIS — F411 Generalized anxiety disorder: Secondary | ICD-10-CM | POA: Diagnosis not present

## 2023-07-31 ENCOUNTER — Telehealth: Payer: Self-pay | Admitting: Family Medicine

## 2023-07-31 DIAGNOSIS — G20B1 Parkinson's disease with dyskinesia, without mention of fluctuations: Secondary | ICD-10-CM | POA: Diagnosis not present

## 2023-07-31 DIAGNOSIS — R3915 Urgency of urination: Secondary | ICD-10-CM | POA: Diagnosis not present

## 2023-07-31 DIAGNOSIS — J45909 Unspecified asthma, uncomplicated: Secondary | ICD-10-CM | POA: Diagnosis not present

## 2023-07-31 DIAGNOSIS — M1990 Unspecified osteoarthritis, unspecified site: Secondary | ICD-10-CM | POA: Diagnosis not present

## 2023-07-31 DIAGNOSIS — E782 Mixed hyperlipidemia: Secondary | ICD-10-CM | POA: Diagnosis not present

## 2023-07-31 DIAGNOSIS — I480 Paroxysmal atrial fibrillation: Secondary | ICD-10-CM | POA: Diagnosis not present

## 2023-07-31 DIAGNOSIS — D649 Anemia, unspecified: Secondary | ICD-10-CM | POA: Diagnosis not present

## 2023-07-31 DIAGNOSIS — I10 Essential (primary) hypertension: Secondary | ICD-10-CM | POA: Diagnosis not present

## 2023-07-31 DIAGNOSIS — F411 Generalized anxiety disorder: Secondary | ICD-10-CM | POA: Diagnosis not present

## 2023-07-31 DIAGNOSIS — I2510 Atherosclerotic heart disease of native coronary artery without angina pectoris: Secondary | ICD-10-CM | POA: Diagnosis not present

## 2023-07-31 NOTE — Telephone Encounter (Signed)
Caller/Agency: [facility]) Callback Number: [phone], ok to LDVM Requesting OT/PT/Skilled Nursing/Social Work/Speech Therapy: Speech Therapy Frequency: RS SP evaluation to week 10.27.24 due to pt scheduling conflict

## 2023-07-31 NOTE — Telephone Encounter (Signed)
Called Walter Brown and left a detailed voice message. Informed her to call office if she has further questions

## 2023-08-01 ENCOUNTER — Telehealth: Payer: Self-pay | Admitting: Family Medicine

## 2023-08-01 NOTE — Telephone Encounter (Signed)
Caller/Agency: Efraim Kaufmann ([facility])  Callback Number: [phone]  Requesting OT/PT/Skilled Nursing/Social Work/Speech Therapy: OT Frequency: 1x for 4 weeks

## 2023-08-04 DIAGNOSIS — D649 Anemia, unspecified: Secondary | ICD-10-CM | POA: Diagnosis not present

## 2023-08-04 DIAGNOSIS — I480 Paroxysmal atrial fibrillation: Secondary | ICD-10-CM | POA: Diagnosis not present

## 2023-08-04 DIAGNOSIS — F411 Generalized anxiety disorder: Secondary | ICD-10-CM | POA: Diagnosis not present

## 2023-08-04 DIAGNOSIS — M1990 Unspecified osteoarthritis, unspecified site: Secondary | ICD-10-CM | POA: Diagnosis not present

## 2023-08-04 DIAGNOSIS — I2510 Atherosclerotic heart disease of native coronary artery without angina pectoris: Secondary | ICD-10-CM | POA: Diagnosis not present

## 2023-08-04 DIAGNOSIS — I10 Essential (primary) hypertension: Secondary | ICD-10-CM | POA: Diagnosis not present

## 2023-08-04 DIAGNOSIS — E782 Mixed hyperlipidemia: Secondary | ICD-10-CM | POA: Diagnosis not present

## 2023-08-04 DIAGNOSIS — J45909 Unspecified asthma, uncomplicated: Secondary | ICD-10-CM | POA: Diagnosis not present

## 2023-08-04 DIAGNOSIS — G20B1 Parkinson's disease with dyskinesia, without mention of fluctuations: Secondary | ICD-10-CM | POA: Diagnosis not present

## 2023-08-04 NOTE — Telephone Encounter (Signed)
Verbal order given to [place].

## 2023-08-06 DIAGNOSIS — J45909 Unspecified asthma, uncomplicated: Secondary | ICD-10-CM | POA: Diagnosis not present

## 2023-08-06 DIAGNOSIS — M1990 Unspecified osteoarthritis, unspecified site: Secondary | ICD-10-CM | POA: Diagnosis not present

## 2023-08-06 DIAGNOSIS — G20B1 Parkinson's disease with dyskinesia, without mention of fluctuations: Secondary | ICD-10-CM | POA: Diagnosis not present

## 2023-08-06 DIAGNOSIS — I10 Essential (primary) hypertension: Secondary | ICD-10-CM | POA: Diagnosis not present

## 2023-08-06 DIAGNOSIS — I2510 Atherosclerotic heart disease of native coronary artery without angina pectoris: Secondary | ICD-10-CM | POA: Diagnosis not present

## 2023-08-06 DIAGNOSIS — E782 Mixed hyperlipidemia: Secondary | ICD-10-CM | POA: Diagnosis not present

## 2023-08-06 DIAGNOSIS — F411 Generalized anxiety disorder: Secondary | ICD-10-CM | POA: Diagnosis not present

## 2023-08-06 DIAGNOSIS — D649 Anemia, unspecified: Secondary | ICD-10-CM | POA: Diagnosis not present

## 2023-08-06 DIAGNOSIS — I480 Paroxysmal atrial fibrillation: Secondary | ICD-10-CM | POA: Diagnosis not present

## 2023-08-07 DIAGNOSIS — Z9181 History of falling: Secondary | ICD-10-CM | POA: Diagnosis not present

## 2023-08-07 DIAGNOSIS — I10 Essential (primary) hypertension: Secondary | ICD-10-CM | POA: Diagnosis not present

## 2023-08-07 DIAGNOSIS — G20C Parkinsonism, unspecified: Secondary | ICD-10-CM | POA: Diagnosis not present

## 2023-08-07 DIAGNOSIS — R1310 Dysphagia, unspecified: Secondary | ICD-10-CM | POA: Diagnosis not present

## 2023-08-07 DIAGNOSIS — I480 Paroxysmal atrial fibrillation: Secondary | ICD-10-CM | POA: Diagnosis not present

## 2023-08-07 DIAGNOSIS — M1990 Unspecified osteoarthritis, unspecified site: Secondary | ICD-10-CM | POA: Diagnosis not present

## 2023-08-13 ENCOUNTER — Other Ambulatory Visit: Payer: Self-pay

## 2023-08-13 ENCOUNTER — Emergency Department (HOSPITAL_BASED_OUTPATIENT_CLINIC_OR_DEPARTMENT_OTHER): Payer: Medicare HMO

## 2023-08-13 ENCOUNTER — Emergency Department (HOSPITAL_BASED_OUTPATIENT_CLINIC_OR_DEPARTMENT_OTHER)
Admission: EM | Admit: 2023-08-13 | Discharge: 2023-08-13 | Disposition: A | Discharge status: Home / Self Care | Payer: Medicare HMO | Attending: Emergency Medicine | Admitting: Emergency Medicine

## 2023-08-13 ENCOUNTER — Encounter (HOSPITAL_BASED_OUTPATIENT_CLINIC_OR_DEPARTMENT_OTHER): Payer: Self-pay

## 2023-08-13 ENCOUNTER — Telehealth: Payer: Self-pay | Admitting: Family Medicine

## 2023-08-13 DIAGNOSIS — Z85828 Personal history of other malignant neoplasm of skin: Secondary | ICD-10-CM | POA: Diagnosis not present

## 2023-08-13 DIAGNOSIS — N400 Enlarged prostate without lower urinary tract symptoms: Secondary | ICD-10-CM | POA: Insufficient documentation

## 2023-08-13 DIAGNOSIS — R918 Other nonspecific abnormal finding of lung field: Secondary | ICD-10-CM | POA: Diagnosis not present

## 2023-08-13 DIAGNOSIS — Z7901 Long term (current) use of anticoagulants: Secondary | ICD-10-CM | POA: Insufficient documentation

## 2023-08-13 DIAGNOSIS — S299XXA Unspecified injury of thorax, initial encounter: Secondary | ICD-10-CM | POA: Diagnosis present

## 2023-08-13 DIAGNOSIS — K861 Other chronic pancreatitis: Secondary | ICD-10-CM | POA: Insufficient documentation

## 2023-08-13 DIAGNOSIS — S2242XA Multiple fractures of ribs, left side, initial encounter for closed fracture: Secondary | ICD-10-CM | POA: Diagnosis not present

## 2023-08-13 DIAGNOSIS — R109 Unspecified abdominal pain: Secondary | ICD-10-CM | POA: Diagnosis not present

## 2023-08-13 DIAGNOSIS — G20A1 Parkinson's disease without dyskinesia, without mention of fluctuations: Secondary | ICD-10-CM | POA: Diagnosis not present

## 2023-08-13 DIAGNOSIS — R0781 Pleurodynia: Secondary | ICD-10-CM | POA: Diagnosis not present

## 2023-08-13 DIAGNOSIS — R0989 Other specified symptoms and signs involving the circulatory and respiratory systems: Secondary | ICD-10-CM | POA: Diagnosis not present

## 2023-08-13 DIAGNOSIS — W050XXA Fall from non-moving wheelchair, initial encounter: Secondary | ICD-10-CM | POA: Diagnosis not present

## 2023-08-13 DIAGNOSIS — I700 Atherosclerosis of aorta: Secondary | ICD-10-CM | POA: Insufficient documentation

## 2023-08-13 DIAGNOSIS — S2232XA Fracture of one rib, left side, initial encounter for closed fracture: Secondary | ICD-10-CM | POA: Diagnosis not present

## 2023-08-13 LAB — BASIC METABOLIC PANEL
Anion gap: 9 (ref 5–15)
BUN: 18 mg/dL (ref 8–23)
CO2: 29 mmol/L (ref 22–32)
Calcium: 9 mg/dL (ref 8.9–10.3)
Chloride: 99 mmol/L (ref 98–111)
Creatinine, Ser: 1.07 mg/dL (ref 0.61–1.24)
GFR, Estimated: >60 mL/min (ref >60)
Glucose, Bld: 98 mg/dL (ref 70–99)
Potassium: 3.4 mmol/L — ABNORMAL LOW (ref 3.5–5.1)
Sodium: 137 mmol/L (ref 135–145)

## 2023-08-13 LAB — CBC WITH DIFFERENTIAL/PLATELET
Abs Immature Granulocytes: 0.04 10*3/uL (ref 0.00–0.07)
Basophils Absolute: 0.1 10*3/uL (ref 0.0–0.1)
Basophils Relative: 1 %
Eosinophils Absolute: 0.1 10*3/uL (ref 0.0–0.5)
Eosinophils Relative: 1 %
HCT: 38.6 % — ABNORMAL LOW (ref 39.0–52.0)
Hemoglobin: 13.3 g/dL (ref 13.0–17.0)
Immature Granulocytes: 1 %
Lymphocytes Relative: 18 %
Lymphs Abs: 1.4 10*3/uL (ref 0.7–4.0)
MCH: 32.1 pg (ref 26.0–34.0)
MCHC: 34.5 g/dL (ref 30.0–36.0)
MCV: 93.2 fL (ref 80.0–100.0)
Monocytes Absolute: 0.8 10*3/uL (ref 0.1–1.0)
Monocytes Relative: 10 %
Neutro Abs: 5.5 10*3/uL (ref 1.7–7.7)
Neutrophils Relative %: 69 %
Platelets: 130 10*3/uL — ABNORMAL LOW (ref 150–400)
RBC: 4.14 MIL/uL — ABNORMAL LOW (ref 4.22–5.81)
RDW: 13 % (ref 11.5–15.5)
WBC: 7.9 10*3/uL (ref 4.0–10.5)
nRBC: 0 % (ref 0.0–0.2)

## 2023-08-13 MED ORDER — ACETAMINOPHEN 500 MG PO TABS: 1000.0000 mg | ORAL_TABLET | Freq: Four times a day (QID) | ORAL | 0 refills | Status: AC | PRN

## 2023-08-13 MED ORDER — IOHEXOL 300 MG/ML  SOLN
100.0000 mL | Freq: Once | INTRAMUSCULAR | Status: AC | PRN
  Administered 2023-08-13: 100 mL via INTRAVENOUS

## 2023-08-13 NOTE — Telephone Encounter (Signed)
Initial Comment Caller states he fell and is bruised up and thinks he broke a rib Translation No Nurse Assessment Nurse: Nunzio Cory, RN, Sherrie Date/Time (Eastern Time): 08/13/2023 1:56:29 PM Confirm and document reason for call. If symptomatic, describe symptoms. ---Caller states fell during the night. States hit bed railing and hit on left lower ribs. +able to take deep breath. States painful at times when twists. +fluids, +UOP in last 8 hrs. Does the patient have any new or worsening symptoms? ---Yes Will a triage be completed? ---Yes Related visit to physician within the last 2 weeks? ---No Does the PT have any chronic conditions? (i.e. diabetes, asthma, this includes High risk factors for pregnancy, etc.) ---Yes List chronic conditions. ---Parkinson's, A. Fib on Eliquis, Is this a behavioral health or substance abuse call? ---No Guidelines Guideline Title Affirmed Question Affirmed Notes Nurse Date/Time (Eastern Time) Falls and Falling Patient sounds very sick or weak to the triager Nunzio Cory, RN, Sherrie 08/13/2023 1:59:45 PM Disp. Time Lamount Cohen Time) Disposition Final User 08/13/2023 1:17:27 PM Attempt made - message left Carmon, RN, Angelique Blonder 08/13/2023 1:31:59 PM Attempt made - message left Carmon, RN, Angelique Blonder PLEASE NOTE: All timestamps contained within this report are represented as [place] Standard Time. CONFIDENTIALTY NOTICE: This fax transmission is intended only for the addressee. It contains information that is legally privileged, confidential or otherwise protected from use or disclosure. If you are not the intended recipient, you are strictly prohibited from reviewing, disclosing, copying using or disseminating any of this information or taking any action in reliance on or regarding this information. If you have received this fax in error, please notify us immediately by telephone so that we can arrange for its return to [place]. Phone: [phone], Toll-Free: [phone], Fax:  [phone] Page: 2 of 2 Call Id: [id number] Disp. Time Lamount Cohen Time) Disposition Final User 08/13/2023 1:32:27 PM Send To RN Personal Annye English, RN, Angelique Blonder 08/13/2023 2:06:16 PM Go to ED Now (or PCP triage) Yes Nunzio Cory, RN, Sherrie Final Disposition 08/13/2023 2:06:16 PM Go to ED Now (or PCP triage) Yes Nunzio Cory, RN, Sherrie Caller Disagree/Comply Comply Caller Understands Yes PreDisposition InappropriateToAsk Care Advice Given Per Guideline GO TO ED/UCC NOW (OR PCP TRIAGE): * IF NO PCP (PRIMARY CARE PROVIDER) SECOND-LEVEL TRIAGE: You need to be seen within the next hour. Go to the ED/UCC at _____________ Hospital. Leave as soon as you can. ANOTHER ADULT SHOULD DRIVE: * It is better and safer if another adult drives instead of you. Referrals [facility]

## 2023-08-13 NOTE — ED Provider Notes (Signed)
[facility] Provider Note   CSN: [id number] Arrival date & time: 08/13/23  1515     History  Chief Complaint  Patient presents with   Walter Brown    Walter Brown is a 82 y.o. male.  The history is provided by the patient, a relative and medical records. No language interpreter was used.  Fall    81 year old male significant history of Parkinson disease, atrial fibrillation currently on Eliquis, basal cell carcinoma, unsteady gait, brought here accompanied by son for evaluation of a fall.  Yesterday patient was in the process of transferring himself from the wheelchair to his bed when he lost his balance, fell and strike the left side of his chest wall against the bed rail.  He did not fall down to the ground he denies hitting his head or having any loss of consciousness.  He did notice some mild pain to the left side of his chest and he did reach out to his today who recommend patient to come to ER for further evaluation.  He did not endorse any lightheadedness and dizziness prior to the fall no other complaint no significant shortness of breath coughing up blood or having abdominal pain.  No specific treatment tried at home, currently rates pain as 1 out of 10 at rest and 5 out of 10 with movement.  Home Medications Prior to Admission medications   Medication Sig Start Date End Date Taking? Authorizing Provider  amiodarone (PACERONE) 200 MG tablet TAKE 1/2 TABLET BY MOUTH DAILY 10/21/22   Runell Gess, MD  calcium-vitamin D (OSCAL WITH D) 500-5 MG-MCG tablet Take 1 tablet by mouth.    [provider]  carbidopa-levodopa (SINEMET CR) 50-200 MG tablet TAKE 1 TABLET BY MOUTH EVERYDAY AT BEDTIME 03/24/23   Tat, Rebecca S, DO  carbidopa-levodopa (SINEMET IR) 25-100 MG tablet TAKE 1 TABLET BY MOUTH 3 (THREE) TIMES DAILY. 7AM/11AM/4PM Can take an extra prn 05/15/23   Tat, Octaviano Batty, DO  divalproex (DEPAKOTE) 125 MG DR tablet Take 2 tablets  (250 mg total) by mouth daily. 03/04/23   Tat, Octaviano Batty, DO  ELIQUIS 5 MG TABS tablet TAKE 1 TABLET BY MOUTH TWICE A DAY 02/27/23   Runell Gess, MD  ezetimibe (ZETIA) 10 MG tablet TAKE 1 TABLET BY MOUTH EVERY DAY 02/27/23   Alver Sorrow, NP  hydrochlorothiazide (HYDRODIURIL) 25 MG tablet TAKE 1 TABLET BY MOUTH EVERY DAY 05/15/23   [name], MD  Melatonin 10 MG TABS Take 10 mg by mouth daily as needed.    [provider]  mirabegron ER (MYRBETRIQ) 50 MG TB24 tablet Take 50 mg by mouth daily.    [provider]  Multiple Vitamins-Minerals (ICAPS AREDS 2 PO) Take 1 capsule by mouth in the morning and at bedtime.    [provider]  pantoprazole (PROTONIX) 40 MG tablet Take 1 tablet (40 mg total) by mouth daily as needed. 07/14/23   Sandford Craze, NP  sertraline (ZOLOFT) 100 MG tablet Take 1 tablet (100 mg total) by mouth daily. 02/04/23   [name], MD  tamsulosin (FLOMAX) 0.4 MG CAPS capsule Take 1 capsule (0.4 mg total) by mouth daily. 07/14/23   Sandford Craze, NP      Allergies    Albuterol sulfate, Azithromycin, Doxycycline hyclate, Doxycycline hyclate, and Doxycycline    Review of Systems   Review of Systems  All other systems reviewed and are negative.   Physical Exam  Updated Vital Signs BP 138/85 (BP Location: Right Arm)   Pulse (!) 50   Temp 98.8 F (37.1 C)   Resp 20   Ht 5\' 5"  (1.651 m)   Wt 78 kg   SpO2 97%   BMI 28.62 kg/m  Physical Exam Vitals and nursing note reviewed.  Constitutional:      General: He is not in acute distress.    Appearance: He is well-developed.  HENT:     Head: Atraumatic.  Eyes:     Conjunctiva/sclera: Conjunctivae normal.  Cardiovascular:     Rate and Rhythm: Rhythm irregular.     Pulses: Normal pulses.     Heart sounds: Normal heart sounds.  Pulmonary:     Effort: Pulmonary effort is normal.     Breath sounds: Normal breath sounds. No wheezing, rhonchi or rales.  Chest:     Chest  wall: No tenderness.  Abdominal:     Palpations: Abdomen is soft.     Tenderness: There is no abdominal tenderness.  Musculoskeletal:     Cervical back: Neck supple.  Skin:    Findings: No rash.  Neurological:     Mental Status: He is alert. Mental status is at baseline.  Psychiatric:        Mood and Affect: Mood normal.     ED Results / Procedures / Treatments   Labs (all labs ordered are listed, but only abnormal results are displayed) Labs Reviewed  BASIC METABOLIC PANEL - Abnormal; Notable for the following components:      Result Value   Potassium 3.4 (*)    All other components within normal limits  CBC WITH DIFFERENTIAL/PLATELET - Abnormal; Notable for the following components:   RBC 4.14 (*)    HCT 38.6 (*)    Platelets 130 (*)    All other components within normal limits    EKG None  Radiology CT CHEST ABDOMEN PELVIS W CONTRAST  Result Date: 08/13/2023 CLINICAL DATA:  Blunt poly trauma, fall, left chest abdominal pain, chest wall deformity EXAM: CT CHEST, ABDOMEN, AND PELVIS WITH CONTRAST TECHNIQUE: Multidetector CT imaging of the chest, abdomen and pelvis was performed following the standard protocol during bolus administration of intravenous contrast. RADIATION DOSE REDUCTION: This exam was performed according to the departmental dose-optimization program which includes automated exposure control, adjustment of the mA and/or kV according to patient size and/or use of iterative reconstruction technique. CONTRAST:  OMNIPAQUE IOHEXOL 300 MG/ML  SOLN COMPARISON:  None Available. FINDINGS: CT CHEST FINDINGS Cardiovascular: Extensive multi-vessel coronary artery calcification. Mild global cardiomegaly. No pericardial effusion. The central pulmonary arteries are of normal caliber. 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 The thoracic aorta is otherwise unremarkable. Visualized arch vasculature is unremarkable. Mediastinum/Nodes: The  visualized thyroid is unremarkable. No pathologic thoracic adenopathy. Small fluid and frothy secretions within the mid esophagus may reflect changes of esophageal dysmotility or gastroesophageal reflux. Lungs/Pleura: Lungs are clear. No pleural effusion or pneumothorax. Musculoskeletal: Several healed left rib fractures are identified. There is a superimposed acute minimally displaced fracture of the left tenth rib posterolaterally. The osseous structures are otherwise intact. Degenerative changes are seen within the thoracic spine., CT ABDOMEN PELVIS FINDINGS Hepatobiliary: No focal liver abnormality is seen. No gallstones, gallbladder wall thickening, or biliary dilatation. Pancreas: Scattered parenchymal calcifications are seen within the head and uncinate process of the pancreas in keeping with changes of chronic pancreatitis in this region. There is normal enhancement and bulk of the pancreatic parenchyma. The  pancreatic duct is not dilated. No acute peripancreatic inflammatory changes or loculated fluid collections are identified. Spleen: Unremarkable Adrenals/Urinary Tract: Adrenal glands are unremarkable. Kidneys are normal, without renal calculi, focal lesion, or hydronephrosis. Bladder is unremarkable. Stomach/Bowel: Stomach is within normal limits. Appendix appears normal. No evidence of bowel wall thickening, distention, or inflammatory changes. Vascular/Lymphatic: Aortic atherosclerosis. No enlarged abdominal or pelvic lymph nodes. Reproductive: There is marked hypertrophy of the prostate gland which indents the base of the bladder. Other: No abdominal wall hernia Musculoskeletal: Degenerative changes are seen within the lumbar spine. No acute bone abnormality. No lytic or blastic bone lesion. IMPRESSION: 1. Acute minimally displaced fracture of the left tenth rib posterolaterally. No pneumothorax. 2. Extensive multi-vessel coronary artery calcification. Mild global cardiomegaly. 3. Small fluid and  frothy secretions within the mid esophagus may reflect changes of esophageal dysmotility or gastroesophageal reflux. 4. Changes of chronic pancreatitis. No acute peripancreatic inflammatory changes or loculated fluid collections are identified. 5. Marked hypertrophy of the prostate gland which indents the base of the bladder. The bladder, however, is not distended to suggest bladder outlet obstruction. Aortic Atherosclerosis (ICD10-I70.0). Electronically Signed   By: Helyn Numbers M.D.   On: 08/13/2023 21:53   DG Chest 2 View  Result Date: 08/13/2023 CLINICAL DATA:  Fall last night.  Lower left rib pain. EXAM: CHEST - 2 VIEW COMPARISON:  Chest radiographs 09/29/2022 and 09/24/2022. CT 09/24/2019. FINDINGS: The heart size and mediastinal contours are stable. Lower lung volumes with increased left lower lobe opacity, likely reflecting atelectasis. No pleural effusion, pneumothorax or confluent airspace disease. Colonic interposition between the liver and right hemidiaphragm is unchanged. New irregularity of the left 9th and 10th ribs laterally without definite acute features on routine chest radiography. No acute displaced rib fractures are visualized. IMPRESSION: 1. Lower lung volumes with increased left lower lobe opacity, likely reflecting atelectasis. 2. New irregularity of the left 9th and 10th ribs laterally without definite acute features on routine chest radiography. Correlate with physical exam. Electronically Signed   By: Carey Bullocks M.D.   On: 08/13/2023 17:38    Procedures Procedures    Medications Ordered in ED Medications  iohexol (OMNIPAQUE) 300 MG/ML solution 100 mL (100 mLs Intravenous Contrast Given 08/13/23 2102)    ED Course/ Medical Decision Making/ A&P                                 Medical Decision Making Amount and/or Complexity of Data Reviewed Labs: ordered. Radiology: ordered.  Risk Prescription drug management.   BP 130/80 (BP Location: Right Arm)   Pulse  (!) 50   Temp 98.6 F (37 C) (Oral)   Resp 19   Ht 5\' 5"  (1.651 m)   Wt 78 kg   SpO2 97%   BMI 28.62 kg/m   13:84 PM  82 year old male significant history of Parkinson disease, atrial fibrillation currently on Eliquis, basal cell carcinoma, unsteady gait, brought here accompanied by son for evaluation of a fall.  Yesterday patient was in the process of transferring himself from the wheelchair to his bed when he lost his balance, fell and strike the left side of his chest wall against the bed rail.  He did not fall down to the ground he denies hitting his head or having any loss of consciousness.  He did notice some mild pain to the left side of his chest and he did reach out to his today  who recommend patient to come to ER for further evaluation.  He did not endorse any lightheadedness and dizziness prior to the fall no other complaint no significant shortness of breath coughing up blood or having abdominal pain.  No specific treatment tried at home, currently rates pain as 1 out of 10 at rest and 5 out of 10 with movement.  Exam overall reassuring no significant reproducible tenderness to the chest wall or the abdomen.  No ecchymosis or hematoma noted no crepitus or emphysema noted.  Lungs are clear, heart with regular rhythm but rate controlled.  X-ray of the chest was obtained independent viewed interpreted by me without any acute finding.  Radiologist mentioned there is some new irregularity at the left ninth and 10th ribs without any acute feature.  I have examined this area and patient does not have any significant tenderness to palpation to suggest rib fracture.  I made patient aware that sometime x-ray may miss occult rib fracture however in the setting of minimal discomfort and patient overall well-appearing chest CT considered but not performed.  Care discussed with Dr. Elayne Snare.  10:42 PM Due to patient being on blood thinner medication, abundance of precaution was made and therefore a CT  scan of the chest abdomen pelvis was performed independently viewed and interpreted by me which show 1 broken rib on the left side without any other concerning finding.  Agree with radiology interpretation.  I discussed definitive rib fracture care which includes using incentive spirometer, taking Tylenol for pain, and return precaution was given.  Labs obtained and overall reassuring.  All questions answered to patient's satisfaction.  Social determinant of health including stress.  Hospitalization considered but with improvement of pain and no hypoxia patient stable for discharge.        Final Clinical Impression(s) / ED Diagnoses Final diagnoses:  Closed fracture of one rib of left side, initial encounter    Rx / DC Orders ED Discharge Orders          Ordered    acetaminophen (TYLENOL) 500 MG tablet  Every 6 hours PRN        08/13/23 2252              Fayrene Helper, PA-C 08/13/23 2254    Royanne Foots, DO 08/19/23 1153

## 2023-08-13 NOTE — ED Notes (Signed)
ED Provider at bedside. 

## 2023-08-13 NOTE — Discharge Instructions (Signed)
You have been evaluated for your injury.  CT scan today demonstrate 1 broken rib on the left side likely causing your discomfort.  Please take Tylenol as needed for pain.  Use incentive spirometer as instructed to help decrease risks of developing lung infection.  Follow instruction below.  Return if you have any concern specifically if you develop significant shortness of breath.

## 2023-08-13 NOTE — ED Notes (Signed)
Pt called out with nausea. Paramedic [name] informed. Pt given a vomit bag and set up in bed. Pt son is in the room with pt.

## 2023-08-13 NOTE — Telephone Encounter (Signed)
Pt going to [place]

## 2023-08-13 NOTE — ED Triage Notes (Addendum)
Pt reports he lost his balance and fell down into a bed rail at home last night. He did not fall down onto the floor. He did not hit his head. He takes Eliquis. He reports left side pain, pointing to left rib cage area. Denies trouble breathing or any pain anywhere else. He is A&Ox4, hx of Parkinsons.

## 2023-08-13 NOTE — Telephone Encounter (Signed)
FYI: This call has been transferred to triage nurse: the Triage Nurse. Once the result note has been entered staff can address the message at that time.  Patient called in with the following symptoms:  Red Word:fall    Please advise at Mobile [phone] (mobile)  Message is routed to Provider Pool.

## 2023-08-14 ENCOUNTER — Telehealth: Payer: Self-pay | Admitting: *Deleted

## 2023-08-14 NOTE — Transitions of Care (Post Inpatient/ED Visit) (Signed)
   08/14/2023  Name: Walter Brown MRN: [id number] DOB: 1941/07/11  Today's TOC FU Call Status: Today's TOC FU Call Status:: Unsuccessful Call (1st Attempt) Unsuccessful Call (1st Attempt) Date: 08/14/23  Attempted to reach the patient regarding the most recent Inpatient/ED visit.  Follow Up Plan: Additional outreach attempts will be made to reach the patient to complete the Transitions of Care (Post Inpatient/ED visit) call.   Signature Delinda Malan, [facility]

## 2023-08-15 ENCOUNTER — Telehealth: Payer: Self-pay | Admitting: *Deleted

## 2023-08-15 DIAGNOSIS — I480 Paroxysmal atrial fibrillation: Secondary | ICD-10-CM | POA: Diagnosis not present

## 2023-08-15 DIAGNOSIS — D649 Anemia, unspecified: Secondary | ICD-10-CM | POA: Diagnosis not present

## 2023-08-15 DIAGNOSIS — M1990 Unspecified osteoarthritis, unspecified site: Secondary | ICD-10-CM | POA: Diagnosis not present

## 2023-08-15 DIAGNOSIS — F411 Generalized anxiety disorder: Secondary | ICD-10-CM | POA: Diagnosis not present

## 2023-08-15 DIAGNOSIS — G20B1 Parkinson's disease with dyskinesia, without mention of fluctuations: Secondary | ICD-10-CM | POA: Diagnosis not present

## 2023-08-15 DIAGNOSIS — I10 Essential (primary) hypertension: Secondary | ICD-10-CM | POA: Diagnosis not present

## 2023-08-15 DIAGNOSIS — I2510 Atherosclerotic heart disease of native coronary artery without angina pectoris: Secondary | ICD-10-CM | POA: Diagnosis not present

## 2023-08-15 DIAGNOSIS — J45909 Unspecified asthma, uncomplicated: Secondary | ICD-10-CM | POA: Diagnosis not present

## 2023-08-15 DIAGNOSIS — E782 Mixed hyperlipidemia: Secondary | ICD-10-CM | POA: Diagnosis not present

## 2023-08-15 NOTE — Transitions of Care (Post Inpatient/ED Visit) (Unsigned)
   08/15/2023  Name: Walter Brown MRN: [id number] DOB: January 01, 1941  Today's TOC FU Call Status: Today's TOC FU Call Status:: Unsuccessful Call (2nd Attempt) Unsuccessful Call (2nd Attempt) Date: 08/15/23  Attempted to reach the patient regarding the most recent Inpatient/ED visit.  Follow Up Plan: Additional outreach attempts will be made to reach the patient to complete the Transitions of Care (Post Inpatient/ED visit) call.   Signature Sylwia Cuervo, [facility]

## 2023-08-18 NOTE — Transitions of Care (Post Inpatient/ED Visit) (Signed)
   08/18/2023  Name: Walter Brown MRN: [id number] DOB: 1941/06/19  Today's TOC FU Call Status: Today's TOC FU Call Status:: Unsuccessful Call (3rd Attempt) Unsuccessful Call (2nd Attempt) Date: 08/15/23 Unsuccessful Call (3rd Attempt) Date: 08/18/23  Attempted to reach the patient regarding the most recent Inpatient/ED visit.  Follow Up Plan: No further outreach attempts will be made at this time. We have been unable to contact the patient.  Signature Fannie Alomar, [facility]

## 2023-08-20 DIAGNOSIS — G20B1 Parkinson's disease with dyskinesia, without mention of fluctuations: Secondary | ICD-10-CM | POA: Diagnosis not present

## 2023-08-20 DIAGNOSIS — F411 Generalized anxiety disorder: Secondary | ICD-10-CM | POA: Diagnosis not present

## 2023-08-20 DIAGNOSIS — I10 Essential (primary) hypertension: Secondary | ICD-10-CM | POA: Diagnosis not present

## 2023-08-20 DIAGNOSIS — J45909 Unspecified asthma, uncomplicated: Secondary | ICD-10-CM | POA: Diagnosis not present

## 2023-08-20 DIAGNOSIS — D649 Anemia, unspecified: Secondary | ICD-10-CM | POA: Diagnosis not present

## 2023-08-20 DIAGNOSIS — I2510 Atherosclerotic heart disease of native coronary artery without angina pectoris: Secondary | ICD-10-CM | POA: Diagnosis not present

## 2023-08-20 DIAGNOSIS — E782 Mixed hyperlipidemia: Secondary | ICD-10-CM | POA: Diagnosis not present

## 2023-08-20 DIAGNOSIS — I480 Paroxysmal atrial fibrillation: Secondary | ICD-10-CM | POA: Diagnosis not present

## 2023-08-20 DIAGNOSIS — M1990 Unspecified osteoarthritis, unspecified site: Secondary | ICD-10-CM | POA: Diagnosis not present

## 2023-08-21 ENCOUNTER — Other Ambulatory Visit: Payer: Self-pay | Admitting: Neurology

## 2023-08-21 DIAGNOSIS — F411 Generalized anxiety disorder: Secondary | ICD-10-CM | POA: Diagnosis not present

## 2023-08-21 DIAGNOSIS — D649 Anemia, unspecified: Secondary | ICD-10-CM | POA: Diagnosis not present

## 2023-08-21 DIAGNOSIS — G20A1 Parkinson's disease without dyskinesia, without mention of fluctuations: Secondary | ICD-10-CM

## 2023-08-21 DIAGNOSIS — J45909 Unspecified asthma, uncomplicated: Secondary | ICD-10-CM | POA: Diagnosis not present

## 2023-08-21 DIAGNOSIS — I2510 Atherosclerotic heart disease of native coronary artery without angina pectoris: Secondary | ICD-10-CM | POA: Diagnosis not present

## 2023-08-21 DIAGNOSIS — I480 Paroxysmal atrial fibrillation: Secondary | ICD-10-CM | POA: Diagnosis not present

## 2023-08-21 DIAGNOSIS — I10 Essential (primary) hypertension: Secondary | ICD-10-CM | POA: Diagnosis not present

## 2023-08-21 DIAGNOSIS — E782 Mixed hyperlipidemia: Secondary | ICD-10-CM | POA: Diagnosis not present

## 2023-08-21 DIAGNOSIS — G20B1 Parkinson's disease with dyskinesia, without mention of fluctuations: Secondary | ICD-10-CM | POA: Diagnosis not present

## 2023-08-21 DIAGNOSIS — M1990 Unspecified osteoarthritis, unspecified site: Secondary | ICD-10-CM | POA: Diagnosis not present

## 2023-08-27 ENCOUNTER — Telehealth: Payer: Self-pay | Admitting: Family Medicine

## 2023-08-27 DIAGNOSIS — D649 Anemia, unspecified: Secondary | ICD-10-CM | POA: Diagnosis not present

## 2023-08-27 DIAGNOSIS — M1990 Unspecified osteoarthritis, unspecified site: Secondary | ICD-10-CM | POA: Diagnosis not present

## 2023-08-27 DIAGNOSIS — G20B1 Parkinson's disease with dyskinesia, without mention of fluctuations: Secondary | ICD-10-CM | POA: Diagnosis not present

## 2023-08-27 DIAGNOSIS — I2510 Atherosclerotic heart disease of native coronary artery without angina pectoris: Secondary | ICD-10-CM | POA: Diagnosis not present

## 2023-08-27 DIAGNOSIS — E782 Mixed hyperlipidemia: Secondary | ICD-10-CM | POA: Diagnosis not present

## 2023-08-27 DIAGNOSIS — I480 Paroxysmal atrial fibrillation: Secondary | ICD-10-CM | POA: Diagnosis not present

## 2023-08-27 DIAGNOSIS — J45909 Unspecified asthma, uncomplicated: Secondary | ICD-10-CM | POA: Diagnosis not present

## 2023-08-27 DIAGNOSIS — I10 Essential (primary) hypertension: Secondary | ICD-10-CM | POA: Diagnosis not present

## 2023-08-27 DIAGNOSIS — F411 Generalized anxiety disorder: Secondary | ICD-10-CM | POA: Diagnosis not present

## 2023-08-27 NOTE — Telephone Encounter (Signed)
Melissa from [facility] wanted to report another fall, making this 7 in the last 30 days. She is seeing him later today to assess any injuries. She states she has a suspicion that pt's wife is letting the pt manage his medications again which is increasing his falls because he ends up not taking medications as prescribed. She stated that is what happened last time when he was having frequent falls. She advised maybe a conversation with pcp would help the wife understand the importance of managing pt's meds.

## 2023-08-28 DIAGNOSIS — I2510 Atherosclerotic heart disease of native coronary artery without angina pectoris: Secondary | ICD-10-CM | POA: Diagnosis not present

## 2023-08-28 DIAGNOSIS — J45909 Unspecified asthma, uncomplicated: Secondary | ICD-10-CM | POA: Diagnosis not present

## 2023-08-28 DIAGNOSIS — M1990 Unspecified osteoarthritis, unspecified site: Secondary | ICD-10-CM | POA: Diagnosis not present

## 2023-08-28 DIAGNOSIS — G20B1 Parkinson's disease with dyskinesia, without mention of fluctuations: Secondary | ICD-10-CM | POA: Diagnosis not present

## 2023-08-28 DIAGNOSIS — F411 Generalized anxiety disorder: Secondary | ICD-10-CM | POA: Diagnosis not present

## 2023-08-28 DIAGNOSIS — D649 Anemia, unspecified: Secondary | ICD-10-CM | POA: Diagnosis not present

## 2023-08-28 DIAGNOSIS — E782 Mixed hyperlipidemia: Secondary | ICD-10-CM | POA: Diagnosis not present

## 2023-08-28 DIAGNOSIS — I480 Paroxysmal atrial fibrillation: Secondary | ICD-10-CM | POA: Diagnosis not present

## 2023-08-28 DIAGNOSIS — I10 Essential (primary) hypertension: Secondary | ICD-10-CM | POA: Diagnosis not present

## 2023-09-01 DIAGNOSIS — F411 Generalized anxiety disorder: Secondary | ICD-10-CM | POA: Diagnosis not present

## 2023-09-01 DIAGNOSIS — M1990 Unspecified osteoarthritis, unspecified site: Secondary | ICD-10-CM | POA: Diagnosis not present

## 2023-09-01 DIAGNOSIS — D649 Anemia, unspecified: Secondary | ICD-10-CM | POA: Diagnosis not present

## 2023-09-01 DIAGNOSIS — I10 Essential (primary) hypertension: Secondary | ICD-10-CM | POA: Diagnosis not present

## 2023-09-01 DIAGNOSIS — J45909 Unspecified asthma, uncomplicated: Secondary | ICD-10-CM | POA: Diagnosis not present

## 2023-09-01 DIAGNOSIS — G20B1 Parkinson's disease with dyskinesia, without mention of fluctuations: Secondary | ICD-10-CM | POA: Diagnosis not present

## 2023-09-01 DIAGNOSIS — I480 Paroxysmal atrial fibrillation: Secondary | ICD-10-CM | POA: Diagnosis not present

## 2023-09-01 DIAGNOSIS — I2510 Atherosclerotic heart disease of native coronary artery without angina pectoris: Secondary | ICD-10-CM | POA: Diagnosis not present

## 2023-09-01 DIAGNOSIS — E782 Mixed hyperlipidemia: Secondary | ICD-10-CM | POA: Diagnosis not present

## 2023-09-02 NOTE — Telephone Encounter (Signed)
Spoke with patient wife.  She stated that she manages his medications, they have a pill organizer and takes them regularly.  She dates that once in a while he misses the pill for parkinsons.  Advised her that he needs to take that medication.  He has PT and OT that comes out to the home.  She also stated that social work came out as well.  She asked if he may need to increase the medication for the parkinsons and I advised her that she would have to talk with Dr. Arbutus Leas about that.  Looks like they have an appt with her in January.

## 2023-09-08 DIAGNOSIS — M1990 Unspecified osteoarthritis, unspecified site: Secondary | ICD-10-CM | POA: Diagnosis not present

## 2023-09-08 DIAGNOSIS — I2510 Atherosclerotic heart disease of native coronary artery without angina pectoris: Secondary | ICD-10-CM | POA: Diagnosis not present

## 2023-09-08 DIAGNOSIS — I480 Paroxysmal atrial fibrillation: Secondary | ICD-10-CM | POA: Diagnosis not present

## 2023-09-08 DIAGNOSIS — J45909 Unspecified asthma, uncomplicated: Secondary | ICD-10-CM | POA: Diagnosis not present

## 2023-09-08 DIAGNOSIS — D649 Anemia, unspecified: Secondary | ICD-10-CM | POA: Diagnosis not present

## 2023-09-08 DIAGNOSIS — E782 Mixed hyperlipidemia: Secondary | ICD-10-CM | POA: Diagnosis not present

## 2023-09-08 DIAGNOSIS — F411 Generalized anxiety disorder: Secondary | ICD-10-CM | POA: Diagnosis not present

## 2023-09-08 DIAGNOSIS — G20B1 Parkinson's disease with dyskinesia, without mention of fluctuations: Secondary | ICD-10-CM | POA: Diagnosis not present

## 2023-09-08 DIAGNOSIS — I10 Essential (primary) hypertension: Secondary | ICD-10-CM | POA: Diagnosis not present

## 2023-09-12 DIAGNOSIS — F411 Generalized anxiety disorder: Secondary | ICD-10-CM | POA: Diagnosis not present

## 2023-09-12 DIAGNOSIS — J45909 Unspecified asthma, uncomplicated: Secondary | ICD-10-CM | POA: Diagnosis not present

## 2023-09-12 DIAGNOSIS — G20B1 Parkinson's disease with dyskinesia, without mention of fluctuations: Secondary | ICD-10-CM | POA: Diagnosis not present

## 2023-09-12 DIAGNOSIS — E782 Mixed hyperlipidemia: Secondary | ICD-10-CM | POA: Diagnosis not present

## 2023-09-12 DIAGNOSIS — M1990 Unspecified osteoarthritis, unspecified site: Secondary | ICD-10-CM | POA: Diagnosis not present

## 2023-09-12 DIAGNOSIS — I2510 Atherosclerotic heart disease of native coronary artery without angina pectoris: Secondary | ICD-10-CM | POA: Diagnosis not present

## 2023-09-12 DIAGNOSIS — D649 Anemia, unspecified: Secondary | ICD-10-CM | POA: Diagnosis not present

## 2023-09-12 DIAGNOSIS — I10 Essential (primary) hypertension: Secondary | ICD-10-CM | POA: Diagnosis not present

## 2023-09-12 DIAGNOSIS — I480 Paroxysmal atrial fibrillation: Secondary | ICD-10-CM | POA: Diagnosis not present

## 2023-09-15 DIAGNOSIS — J45909 Unspecified asthma, uncomplicated: Secondary | ICD-10-CM | POA: Diagnosis not present

## 2023-09-15 DIAGNOSIS — I480 Paroxysmal atrial fibrillation: Secondary | ICD-10-CM | POA: Diagnosis not present

## 2023-09-15 DIAGNOSIS — G20B1 Parkinson's disease with dyskinesia, without mention of fluctuations: Secondary | ICD-10-CM | POA: Diagnosis not present

## 2023-09-15 DIAGNOSIS — I2510 Atherosclerotic heart disease of native coronary artery without angina pectoris: Secondary | ICD-10-CM | POA: Diagnosis not present

## 2023-09-15 DIAGNOSIS — I10 Essential (primary) hypertension: Secondary | ICD-10-CM | POA: Diagnosis not present

## 2023-09-15 DIAGNOSIS — E782 Mixed hyperlipidemia: Secondary | ICD-10-CM | POA: Diagnosis not present

## 2023-09-15 DIAGNOSIS — M1990 Unspecified osteoarthritis, unspecified site: Secondary | ICD-10-CM | POA: Diagnosis not present

## 2023-09-15 DIAGNOSIS — F411 Generalized anxiety disorder: Secondary | ICD-10-CM | POA: Diagnosis not present

## 2023-09-15 DIAGNOSIS — D649 Anemia, unspecified: Secondary | ICD-10-CM | POA: Diagnosis not present

## 2023-09-23 DIAGNOSIS — R3915 Urgency of urination: Secondary | ICD-10-CM | POA: Diagnosis not present

## 2023-09-23 DIAGNOSIS — N319 Neuromuscular dysfunction of bladder, unspecified: Secondary | ICD-10-CM | POA: Diagnosis not present

## 2023-09-25 DIAGNOSIS — H353221 Exudative age-related macular degeneration, left eye, with active choroidal neovascularization: Secondary | ICD-10-CM | POA: Diagnosis not present

## 2023-09-25 DIAGNOSIS — H35372 Puckering of macula, left eye: Secondary | ICD-10-CM | POA: Diagnosis not present

## 2023-09-25 DIAGNOSIS — H353112 Nonexudative age-related macular degeneration, right eye, intermediate dry stage: Secondary | ICD-10-CM | POA: Diagnosis not present

## 2023-09-25 DIAGNOSIS — H43813 Vitreous degeneration, bilateral: Secondary | ICD-10-CM | POA: Diagnosis not present

## 2023-09-28 DIAGNOSIS — D649 Anemia, unspecified: Secondary | ICD-10-CM | POA: Diagnosis not present

## 2023-09-28 DIAGNOSIS — E782 Mixed hyperlipidemia: Secondary | ICD-10-CM | POA: Diagnosis not present

## 2023-09-28 DIAGNOSIS — M1990 Unspecified osteoarthritis, unspecified site: Secondary | ICD-10-CM | POA: Diagnosis not present

## 2023-09-28 DIAGNOSIS — I10 Essential (primary) hypertension: Secondary | ICD-10-CM | POA: Diagnosis not present

## 2023-09-28 DIAGNOSIS — I480 Paroxysmal atrial fibrillation: Secondary | ICD-10-CM | POA: Diagnosis not present

## 2023-09-28 DIAGNOSIS — I2510 Atherosclerotic heart disease of native coronary artery without angina pectoris: Secondary | ICD-10-CM | POA: Diagnosis not present

## 2023-09-28 DIAGNOSIS — G20B1 Parkinson's disease with dyskinesia, without mention of fluctuations: Secondary | ICD-10-CM | POA: Diagnosis not present

## 2023-09-28 DIAGNOSIS — F411 Generalized anxiety disorder: Secondary | ICD-10-CM | POA: Diagnosis not present

## 2023-09-28 DIAGNOSIS — J45909 Unspecified asthma, uncomplicated: Secondary | ICD-10-CM | POA: Diagnosis not present

## 2023-09-29 ENCOUNTER — Telehealth: Payer: Self-pay | Admitting: Emergency Medicine

## 2023-09-29 NOTE — Telephone Encounter (Signed)
Just checking to see if it is alright to give verbal consent for this request

## 2023-09-29 NOTE — Telephone Encounter (Signed)
Called [employer] [facility]. No answer. LVM

## 2023-09-29 NOTE — Telephone Encounter (Signed)
Copied from CRM [id number]. Topic: Clinical - Home Health Verbal Orders >> Sep 29, 2023  9:18 AM Isabell A wrote: Caller/Agency: [facility] Callback Number: [phone] Service Requested: Occupational Therapy Frequency: One time a week, for five weeks  Any new concerns about the patient? No

## 2023-09-30 NOTE — Telephone Encounter (Signed)
Called and spoke with Melissa this morning. Verbal order given per provider's instructions.

## 2023-09-30 NOTE — Telephone Encounter (Signed)
Call was returned, but received voicemail again. Spoke with Melissa from Occupational therapy. She is the one who needed a verbal order.

## 2023-09-30 NOTE — Telephone Encounter (Signed)
Copied from CRM [id number]. Topic: General - Other >> Sep 30, 2023  8:14 AM Pascal Lux wrote: Reason for CRM: Vicky from [facility] called regarding a voicemail she received from [place] regarding patient and would like for her to return her call at [phone].

## 2023-10-02 ENCOUNTER — Other Ambulatory Visit (HOSPITAL_BASED_OUTPATIENT_CLINIC_OR_DEPARTMENT_OTHER): Payer: Self-pay | Admitting: Cardiovascular Disease

## 2023-10-02 ENCOUNTER — Other Ambulatory Visit: Payer: Self-pay | Admitting: Neurology

## 2023-10-02 ENCOUNTER — Other Ambulatory Visit: Payer: Self-pay | Admitting: Family Medicine

## 2023-10-02 DIAGNOSIS — G20A1 Parkinson's disease without dyskinesia, without mention of fluctuations: Secondary | ICD-10-CM

## 2023-10-06 ENCOUNTER — Encounter (HOSPITAL_BASED_OUTPATIENT_CLINIC_OR_DEPARTMENT_OTHER): Payer: Self-pay | Admitting: Emergency Medicine

## 2023-10-06 ENCOUNTER — Emergency Department (HOSPITAL_BASED_OUTPATIENT_CLINIC_OR_DEPARTMENT_OTHER)
Admission: EM | Admit: 2023-10-06 | Discharge: 2023-10-06 | Disposition: A | Discharge status: Home / Self Care | Payer: Medicare HMO | Attending: Emergency Medicine | Admitting: Emergency Medicine

## 2023-10-06 ENCOUNTER — Emergency Department (HOSPITAL_BASED_OUTPATIENT_CLINIC_OR_DEPARTMENT_OTHER): Payer: Medicare HMO

## 2023-10-06 ENCOUNTER — Ambulatory Visit: Payer: Self-pay | Admitting: Family Medicine

## 2023-10-06 ENCOUNTER — Other Ambulatory Visit: Payer: Self-pay

## 2023-10-06 DIAGNOSIS — G20A1 Parkinson's disease without dyskinesia, without mention of fluctuations: Secondary | ICD-10-CM | POA: Insufficient documentation

## 2023-10-06 DIAGNOSIS — J45909 Unspecified asthma, uncomplicated: Secondary | ICD-10-CM | POA: Diagnosis not present

## 2023-10-06 DIAGNOSIS — E876 Hypokalemia: Secondary | ICD-10-CM | POA: Diagnosis not present

## 2023-10-06 DIAGNOSIS — Z7901 Long term (current) use of anticoagulants: Secondary | ICD-10-CM | POA: Diagnosis not present

## 2023-10-06 DIAGNOSIS — W19XXXA Unspecified fall, initial encounter: Secondary | ICD-10-CM

## 2023-10-06 DIAGNOSIS — S0001XA Abrasion of scalp, initial encounter: Secondary | ICD-10-CM | POA: Insufficient documentation

## 2023-10-06 DIAGNOSIS — S0990XA Unspecified injury of head, initial encounter: Secondary | ICD-10-CM | POA: Diagnosis not present

## 2023-10-06 DIAGNOSIS — W01198A Fall on same level from slipping, tripping and stumbling with subsequent striking against other object, initial encounter: Secondary | ICD-10-CM | POA: Diagnosis not present

## 2023-10-06 DIAGNOSIS — G319 Degenerative disease of nervous system, unspecified: Secondary | ICD-10-CM | POA: Diagnosis not present

## 2023-10-06 DIAGNOSIS — Z041 Encounter for examination and observation following transport accident: Secondary | ICD-10-CM | POA: Diagnosis not present

## 2023-10-06 DIAGNOSIS — S199XXA Unspecified injury of neck, initial encounter: Secondary | ICD-10-CM | POA: Diagnosis not present

## 2023-10-06 HISTORY — DX: Unspecified atrial fibrillation: I48.91

## 2023-10-06 LAB — BASIC METABOLIC PANEL
Anion gap: 7 (ref 5–15)
BUN: 22 mg/dL (ref 8–23)
CO2: 28 mmol/L (ref 22–32)
Calcium: 9.2 mg/dL (ref 8.9–10.3)
Chloride: 104 mmol/L (ref 98–111)
Creatinine, Ser: 0.99 mg/dL (ref 0.61–1.24)
GFR, Estimated: >60 mL/min (ref >60)
Glucose, Bld: 90 mg/dL (ref 70–99)
Potassium: 3.3 mmol/L — ABNORMAL LOW (ref 3.5–5.1)
Sodium: 139 mmol/L (ref 135–145)

## 2023-10-06 LAB — CBC
HCT: 38.3 % — ABNORMAL LOW (ref 39.0–52.0)
Hemoglobin: 13 g/dL (ref 13.0–17.0)
MCH: 31.9 pg (ref 26.0–34.0)
MCHC: 33.9 g/dL (ref 30.0–36.0)
MCV: 94.1 fL (ref 80.0–100.0)
Platelets: 138 10*3/uL — ABNORMAL LOW (ref 150–400)
RBC: 4.07 MIL/uL — ABNORMAL LOW (ref 4.22–5.81)
RDW: 13.3 % (ref 11.5–15.5)
WBC: 7.5 10*3/uL (ref 4.0–10.5)
nRBC: 0 % (ref 0.0–0.2)

## 2023-10-06 MED ORDER — POTASSIUM CHLORIDE CRYS ER 20 MEQ PO TBCR
40.0000 meq | EXTENDED_RELEASE_TABLET | Freq: Once | ORAL | Status: AC
  Administered 2023-10-06: 40 meq via ORAL
  Filled 2023-10-06: qty 2
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
## 2023-10-06 NOTE — Discharge Instructions (Signed)
Please follow up with your PCP. Please return with any new or worsening symptoms.
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
## 2023-10-07 ENCOUNTER — Telehealth: Payer: Self-pay | Admitting: *Deleted

## 2023-10-07 NOTE — Transitions of Care (Post Inpatient/ED Visit) (Signed)
   10/07/2023  Name: Walter Brown MRN: [id number] DOB: 1941/08/10  Today's TOC FU Call Status: Today's TOC FU Call Status:: Unsuccessful Call (1st Attempt) Unsuccessful Call (1st Attempt) Date: 10/07/23  Attempted to reach the patient regarding the most recent Inpatient/ED visit.  Follow Up Plan: Additional outreach attempts will be made to reach the patient to complete the Transitions of Care (Post Inpatient/ED visit) call.   Signature Brennan Karam, [facility]

## 2023-10-09 ENCOUNTER — Telehealth: Payer: Self-pay | Admitting: *Deleted

## 2023-10-09 DIAGNOSIS — I480 Paroxysmal atrial fibrillation: Secondary | ICD-10-CM | POA: Diagnosis not present

## 2023-10-09 DIAGNOSIS — G20B1 Parkinson's disease with dyskinesia, without mention of fluctuations: Secondary | ICD-10-CM | POA: Diagnosis not present

## 2023-10-09 DIAGNOSIS — E782 Mixed hyperlipidemia: Secondary | ICD-10-CM | POA: Diagnosis not present

## 2023-10-09 DIAGNOSIS — I10 Essential (primary) hypertension: Secondary | ICD-10-CM | POA: Diagnosis not present

## 2023-10-09 DIAGNOSIS — J45909 Unspecified asthma, uncomplicated: Secondary | ICD-10-CM | POA: Diagnosis not present

## 2023-10-09 DIAGNOSIS — F411 Generalized anxiety disorder: Secondary | ICD-10-CM | POA: Diagnosis not present

## 2023-10-09 DIAGNOSIS — D649 Anemia, unspecified: Secondary | ICD-10-CM | POA: Diagnosis not present

## 2023-10-09 DIAGNOSIS — I2510 Atherosclerotic heart disease of native coronary artery without angina pectoris: Secondary | ICD-10-CM | POA: Diagnosis not present

## 2023-10-09 DIAGNOSIS — M1990 Unspecified osteoarthritis, unspecified site: Secondary | ICD-10-CM | POA: Diagnosis not present

## 2023-10-09 NOTE — Transitions of Care (Post Inpatient/ED Visit) (Signed)
   10/09/2023  Name: Walter Brown MRN: [id number] DOB: Jan 17, 1941  Today's TOC FU Call Status: Today's TOC FU Call Status:: Unsuccessful Call (2nd Attempt) Unsuccessful Call (2nd Attempt) Date: 10/09/23  Attempted to reach the patient regarding the most recent Inpatient/ED visit.  Follow Up Plan: Additional outreach attempts will be made to reach the patient to complete the Transitions of Care (Post Inpatient/ED visit) call.   Signature Liv Rallis, [facility]

## 2023-10-15 DIAGNOSIS — E782 Mixed hyperlipidemia: Secondary | ICD-10-CM | POA: Diagnosis not present

## 2023-10-15 DIAGNOSIS — D649 Anemia, unspecified: Secondary | ICD-10-CM | POA: Diagnosis not present

## 2023-10-15 DIAGNOSIS — I480 Paroxysmal atrial fibrillation: Secondary | ICD-10-CM | POA: Diagnosis not present

## 2023-10-15 DIAGNOSIS — I10 Essential (primary) hypertension: Secondary | ICD-10-CM | POA: Diagnosis not present

## 2023-10-15 DIAGNOSIS — M1990 Unspecified osteoarthritis, unspecified site: Secondary | ICD-10-CM | POA: Diagnosis not present

## 2023-10-15 DIAGNOSIS — F411 Generalized anxiety disorder: Secondary | ICD-10-CM | POA: Diagnosis not present

## 2023-10-15 DIAGNOSIS — I2510 Atherosclerotic heart disease of native coronary artery without angina pectoris: Secondary | ICD-10-CM | POA: Diagnosis not present

## 2023-10-15 DIAGNOSIS — G20C Parkinsonism, unspecified: Secondary | ICD-10-CM | POA: Diagnosis not present

## 2023-10-15 DIAGNOSIS — Z9181 History of falling: Secondary | ICD-10-CM | POA: Diagnosis not present

## 2023-10-15 DIAGNOSIS — R1310 Dysphagia, unspecified: Secondary | ICD-10-CM | POA: Diagnosis not present

## 2023-10-15 DIAGNOSIS — G20B1 Parkinson's disease with dyskinesia, without mention of fluctuations: Secondary | ICD-10-CM | POA: Diagnosis not present

## 2023-10-15 DIAGNOSIS — J45909 Unspecified asthma, uncomplicated: Secondary | ICD-10-CM | POA: Diagnosis not present

## 2023-10-16 ENCOUNTER — Ambulatory Visit: Payer: Self-pay | Admitting: Family Medicine

## 2023-10-16 DIAGNOSIS — D649 Anemia, unspecified: Secondary | ICD-10-CM | POA: Diagnosis not present

## 2023-10-16 DIAGNOSIS — I2510 Atherosclerotic heart disease of native coronary artery without angina pectoris: Secondary | ICD-10-CM | POA: Diagnosis not present

## 2023-10-16 DIAGNOSIS — I480 Paroxysmal atrial fibrillation: Secondary | ICD-10-CM | POA: Diagnosis not present

## 2023-10-16 DIAGNOSIS — J45909 Unspecified asthma, uncomplicated: Secondary | ICD-10-CM | POA: Diagnosis not present

## 2023-10-16 DIAGNOSIS — F411 Generalized anxiety disorder: Secondary | ICD-10-CM | POA: Diagnosis not present

## 2023-10-16 DIAGNOSIS — E782 Mixed hyperlipidemia: Secondary | ICD-10-CM | POA: Diagnosis not present

## 2023-10-16 DIAGNOSIS — G20B1 Parkinson's disease with dyskinesia, without mention of fluctuations: Secondary | ICD-10-CM | POA: Diagnosis not present

## 2023-10-16 DIAGNOSIS — I10 Essential (primary) hypertension: Secondary | ICD-10-CM | POA: Diagnosis not present

## 2023-10-16 DIAGNOSIS — M1990 Unspecified osteoarthritis, unspecified site: Secondary | ICD-10-CM | POA: Diagnosis not present
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
## 2023-10-21 NOTE — Progress Notes (Signed)
Assessment/Plan:   1.  Parkinsons Disease  -DaT scan abnormal in the past  -Increase carbidopa/levodopa, 2 at 7am, 2 at 11am, for now continue 1 at 3pm, 7pm  -continue carbidopa/levodopa 50/200 at bed   -Patient had 2nd opinion at [facility] in 02/2023  -U step walker and has this at home now  -he has a scooter and an electric wheelchair but I want him to get rid of the scooter.  Discussed this in detail today.  2.  Memory change  -Patient with evidence of MCI on neurocog testing in 12/2022.  May have progressed somewhat since that time.  He was scheduled for neurocognitive testing in January, but told him that insurance would likely not pay for that and Dr. Milbert Coulter and I both agreed that we should move that back further.  -  He is currently on Depakote, 125 mg bid.  Son asked why on that as pt didn't have sz and we discussed that wife had c/o agitation in the past and son agreed and understood.  3.  Depression  -Primary care prescribing sertraline, 100 mg daily.    4.  Urinary incontinenece  -follows with [facility].  5.  Incidental infarct, 04/2023  -This was completely asymptomatic and noted on MRI of the brain that was done by our PA for many change  -reviewed findings today  -MRA head and neck was unremarkable.  -Patient already on Eliquis and would not recommend antiplatelet in addition to that, especially given number of falls.  -Patient's last lipids were in February, 2024 and his LDL really was at goal at that point in time.  His LDL was 71.  He has an upcoming physical and will have lipids then.  He is not fasting today.  6.  Atrial fibrillation  -Discussed with patient and family that we really need to have them talk with prescriber of Eliquis, given the number of falls.  Risks and benefits need to be weighed out.  I do not see that the patient has seen cardiology in quite some time (last cardiology nurse practitioner visit that I see is April, 2024) and it is quite possible  that no one is even aware of these falls.  I will reach out to Dr. Allyson Sabal so he knows, although patient is doing some better with fall mitigation (lowered bed, put pole near bed to help him OOB).   Subjective:   Walter Brown was seen today in follow up for Parkinsons disease.  My previous records were reviewed prior to todays visit as well as outside records available to me.  Son accompanies the patient and supplements the history.  Last visit, I increased his levodopa a little bit.  He is tolerating that well, without side effects.  He had neurocognitive testing in March, 2024.  That demonstrated MCI.  Right after our last visit, his wife called and stated that patient was becoming agitated, but she did not want to talk about it in the room in his presence.  He ended up seeing our PA after that.  His MoCA was 30/30.  She and I had discussed increasing VPA or perhaps adding memantine but not donepezil because his pulse had been low.  She scheduled an MRI brain, which demonstrated punctate area focused restriction in the mesial left temporal lobe, which represented acute/subacute infarct as well as moderate cerebral atrophy.  This was followed up with MRA head that was negative.  MRI neck was fairly unremarkable as well.  Since  our last visit, the patient was in the emergency room in November after a fall.  He was transferring himself from the wheelchair to the bed when he lost balance and hit himself against the bed rail.  Patient sustained rib fracture of the left 10th rib posteriorly according to CT chest and perhaps the ninth rib as well, according to x-ray.  Patient was back in the emergency room on December 30 after another fall.  He fell off his scooter and hit his head on a cabinet.  CT brain was done because of being on Eliquis and that was unremarkable.   Son states that they did lower the bed after christmas and that helped with the falls.  They also installed a pole in the bedroom to help him get out  of the bed and that helped.  He is engaging in PT.  He finds it helpful.       Current prescribed movement disorder medications: Carbidopa/levodopa 25/100, 2 at 7am, 1 at 11am,3pm, 7pm Carbidopa/levodopa 50/200 at bedtime   ALLERGIES:   Allergies  Allergen Reactions   Albuterol Sulfate Palpitations   Azithromycin Other (See Comments)    Hepatotoxicity   Doxycycline Hyclate Other (See Comments)    Taken with Azithromycin and had Heaptotoxicity    CURRENT MEDICATIONS:  Outpatient Encounter Medications as of 10/23/2023  Medication Sig   acetaminophen (TYLENOL) 500 MG tablet Take 2 tablets (1,000 mg total) by mouth every 6 (six) hours as needed for moderate pain (pain score 4-6).   amiodarone (PACERONE) 200 MG tablet TAKE 1/2 TABLET BY MOUTH DAILY   calcium-vitamin D (OSCAL WITH D) 500-5 MG-MCG tablet Take 1 tablet by mouth.   carbidopa-levodopa (SINEMET CR) 50-200 MG tablet TAKE 1 TABLET BY MOUTH EVERYDAY AT BEDTIME   carbidopa-levodopa (SINEMET IR) 25-100 MG tablet 2 TABLETS AT 7AM, 1 AT 11AM, 1 AT 3PM, 1 AT 7PM   divalproex (DEPAKOTE) 125 MG DR tablet Take 2 tablets (250 mg total) by mouth daily.   ELIQUIS 5 MG TABS tablet TAKE 1 TABLET BY MOUTH TWICE A DAY   ezetimibe (ZETIA) 10 MG tablet TAKE 1 TABLET BY MOUTH EVERY DAY   hydrochlorothiazide (HYDRODIURIL) 25 MG tablet TAKE 1 TABLET BY MOUTH EVERY DAY   Melatonin 10 MG TABS Take 10 mg by mouth daily as needed.   mirabegron ER (MYRBETRIQ) 50 MG TB24 tablet Take 50 mg by mouth daily.   Multiple Vitamins-Minerals (ICAPS AREDS 2 PO) Take 1 capsule by mouth in the morning and at bedtime.   pantoprazole (PROTONIX) 40 MG tablet Take 1 tablet (40 mg total) by mouth daily as needed.   sertraline (ZOLOFT) 100 MG tablet TAKE 1 TABLET BY MOUTH EVERY DAY   tamsulosin (FLOMAX) 0.4 MG CAPS capsule Take 1 capsule (0.4 mg total) by mouth daily.   No facility-administered encounter medications on file as of 10/23/2023.    Objective:   PHYSICAL  EXAMINATION:    VITALS:   Vitals:   10/23/23 1125  BP: (!) 140/84  Pulse: 62  SpO2: 98%  Height: 5\' 5"  (1.651 m)    GEN:  The patient appears stated age and is in NAD. HEENT:  Normocephalic, atraumatic.  The mucous membranes are moist.  Cardiovascular: Regular rate rhythm (no A-fib today) Lungs: Clear to auscultation bilaterally Neck: No bruits Neurological examination:  Orientation: The patient is alert and oriented x3.   Cranial nerves: There is good facial symmetry with facial hypomimia. The speech is fluent and clear. Soft palate rises  symmetrically and there is no tongue deviation. Hearing is intact to conversational tone. Sensation: Sensation is intact to light touch throughout Motor: Strength is at least antigravity x4.  Movement examination: Tone: There is mild to mod increased tone in the right upper extremity and had just taken medication. Abnormal movements: none Coordination:  There is minimal decremation with hand opening and closing on the right. Gait and Station: The patient pushes off to arise.  He is given a walker.  He is short staffed.  He drags the right leg.  His right leg freezes.  I have reviewed and interpreted the following labs independently    Chemistry      Component Value Date/Time   NA 139 10/06/2023 1223   NA 140 07/09/2023 1236   K 3.3 (L) 10/06/2023 1223   CL 104 10/06/2023 1223   CO2 28 10/06/2023 1223   BUN 22 10/06/2023 1223   BUN 17 07/09/2023 1236   CREATININE 0.99 10/06/2023 1223   CREATININE 1.16 10/20/2020 1426      Component Value Date/Time   CALCIUM 9.2 10/06/2023 1223   ALKPHOS 67 07/09/2023 1236   AST 17 07/09/2023 1236   ALT 19 07/09/2023 1236   BILITOT 0.6 07/09/2023 1236       Lab Results  Component Value Date   WBC 7.5 10/06/2023   HGB 13.0 10/06/2023   HCT 38.3 (L) 10/06/2023   MCV 94.1 10/06/2023   PLT 138 (L) 10/06/2023    Lab Results  Component Value Date   TSH 1.680 07/09/2023     Total time  spent on today's visit was 40 minutes, including both face-to-face time and nonface-to-face time.  Time included that spent on review of records (prior notes available to me/labs/imaging if pertinent), discussing treatment and goals, answering patient's questions and coordinating care.  Cc:  [name], MD

## 2023-10-23 ENCOUNTER — Encounter: Payer: Self-pay | Admitting: Neurology

## 2023-10-23 ENCOUNTER — Ambulatory Visit: Payer: Medicare HMO | Admitting: Neurology

## 2023-10-23 VITALS — BP 140/84 | HR 62 | Ht 65.0 in

## 2023-10-23 DIAGNOSIS — G20A2 Parkinson's disease without dyskinesia, with fluctuations: Secondary | ICD-10-CM | POA: Diagnosis not present

## 2023-10-23 DIAGNOSIS — G20A1 Parkinson's disease without dyskinesia, without mention of fluctuations: Secondary | ICD-10-CM

## 2023-10-23 DIAGNOSIS — R4189 Other symptoms and signs involving cognitive functions and awareness: Secondary | ICD-10-CM

## 2023-10-23 MED ORDER — CARBIDOPA-LEVODOPA 25-100 MG PO TABS
ORAL_TABLET | ORAL | Status: DC
Start: 2023-10-23 — End: 2023-11-27

## 2023-10-23 NOTE — Patient Instructions (Signed)
Increase your carbidopa/levodopa 25/100, 2 at 7am, 2 at 11am, for now continue 1 at 3pm, 7pm Continue carbidopa/levodopa 50/200 at bed

## 2023-10-24 ENCOUNTER — Other Ambulatory Visit: Payer: Self-pay | Admitting: Physician Assistant

## 2023-10-24 DIAGNOSIS — J45909 Unspecified asthma, uncomplicated: Secondary | ICD-10-CM | POA: Diagnosis not present

## 2023-10-24 DIAGNOSIS — M1990 Unspecified osteoarthritis, unspecified site: Secondary | ICD-10-CM | POA: Diagnosis not present

## 2023-10-24 DIAGNOSIS — F411 Generalized anxiety disorder: Secondary | ICD-10-CM | POA: Diagnosis not present

## 2023-10-24 DIAGNOSIS — D649 Anemia, unspecified: Secondary | ICD-10-CM | POA: Diagnosis not present

## 2023-10-24 DIAGNOSIS — E782 Mixed hyperlipidemia: Secondary | ICD-10-CM | POA: Diagnosis not present

## 2023-10-24 DIAGNOSIS — I480 Paroxysmal atrial fibrillation: Secondary | ICD-10-CM | POA: Diagnosis not present

## 2023-10-24 DIAGNOSIS — F0670 Mild neurocognitive disorder due to known physiological condition without behavioral disturbance: Secondary | ICD-10-CM

## 2023-10-24 DIAGNOSIS — G20B1 Parkinson's disease with dyskinesia, without mention of fluctuations: Secondary | ICD-10-CM | POA: Diagnosis not present

## 2023-10-24 DIAGNOSIS — I10 Essential (primary) hypertension: Secondary | ICD-10-CM | POA: Diagnosis not present

## 2023-10-24 DIAGNOSIS — I2510 Atherosclerotic heart disease of native coronary artery without angina pectoris: Secondary | ICD-10-CM | POA: Diagnosis not present

## 2023-10-24 DIAGNOSIS — R4189 Other symptoms and signs involving cognitive functions and awareness: Secondary | ICD-10-CM

## 2023-10-24 DIAGNOSIS — G20A1 Parkinson's disease without dyskinesia, without mention of fluctuations: Secondary | ICD-10-CM

## 2023-10-27 ENCOUNTER — Telehealth: Payer: Self-pay

## 2023-10-27 NOTE — Telephone Encounter (Signed)
Called pt's wife, Alan Ripper (ok per DPR) to discuss appointment for pt. Per neurology pt has been having frequent falls. Tried to call pt 2 times, he is unable to hear me when he answers the phone.   Office visit scheduled with Dr. Allyson Sabal for next week. Wife verbalizes understanding.

## 2023-10-29 ENCOUNTER — Ambulatory Visit: Payer: Self-pay

## 2023-10-29 ENCOUNTER — Institutional Professional Consult (permissible substitution): Payer: PPO | Admitting: Psychology

## 2023-10-29 DIAGNOSIS — D649 Anemia, unspecified: Secondary | ICD-10-CM | POA: Diagnosis not present

## 2023-10-29 DIAGNOSIS — I2510 Atherosclerotic heart disease of native coronary artery without angina pectoris: Secondary | ICD-10-CM | POA: Diagnosis not present

## 2023-10-29 DIAGNOSIS — I480 Paroxysmal atrial fibrillation: Secondary | ICD-10-CM | POA: Diagnosis not present

## 2023-10-29 DIAGNOSIS — I10 Essential (primary) hypertension: Secondary | ICD-10-CM | POA: Diagnosis not present

## 2023-10-29 DIAGNOSIS — E782 Mixed hyperlipidemia: Secondary | ICD-10-CM | POA: Diagnosis not present

## 2023-10-29 DIAGNOSIS — M1990 Unspecified osteoarthritis, unspecified site: Secondary | ICD-10-CM | POA: Diagnosis not present

## 2023-10-29 DIAGNOSIS — F411 Generalized anxiety disorder: Secondary | ICD-10-CM | POA: Diagnosis not present

## 2023-10-29 DIAGNOSIS — J45909 Unspecified asthma, uncomplicated: Secondary | ICD-10-CM | POA: Diagnosis not present

## 2023-10-29 DIAGNOSIS — G20B1 Parkinson's disease with dyskinesia, without mention of fluctuations: Secondary | ICD-10-CM | POA: Diagnosis not present

## 2023-10-30 ENCOUNTER — Other Ambulatory Visit: Payer: Self-pay | Admitting: Family Medicine

## 2023-10-30 ENCOUNTER — Ambulatory Visit: Payer: Medicare HMO | Admitting: Family Medicine

## 2023-10-30 ENCOUNTER — Telehealth: Payer: Self-pay

## 2023-10-30 VITALS — BP 130/72 | HR 75 | Temp 97.5°F | Resp 18 | Ht 65.0 in | Wt 171.0 lb

## 2023-10-30 DIAGNOSIS — I10 Essential (primary) hypertension: Secondary | ICD-10-CM

## 2023-10-30 DIAGNOSIS — R739 Hyperglycemia, unspecified: Secondary | ICD-10-CM

## 2023-10-30 DIAGNOSIS — E782 Mixed hyperlipidemia: Secondary | ICD-10-CM | POA: Diagnosis not present

## 2023-10-30 LAB — LIPID PANEL
Cholesterol: 158 mg/dL (ref 0–200)
HDL: 63.6 mg/dL (ref >39.00)
LDL Cholesterol: 78 mg/dL (ref 0–99)
NonHDL: 94.36
Total CHOL/HDL Ratio: 2
Triglycerides: 84 mg/dL (ref 0.0–149.0)
VLDL: 16.8 mg/dL (ref 0.0–40.0)

## 2023-10-30 LAB — COMPREHENSIVE METABOLIC PANEL
ALT: 6 U/L (ref 0–53)
AST: 16 U/L (ref 0–37)
Albumin: 4.1 g/dL (ref 3.5–5.2)
Alkaline Phosphatase: 54 U/L (ref 39–117)
BUN: 18 mg/dL (ref 6–23)
CO2: 31 meq/L (ref 19–32)
Calcium: 9.1 mg/dL (ref 8.4–10.5)
Chloride: 101 meq/L (ref 96–112)
Creatinine, Ser: 0.94 mg/dL (ref 0.40–1.50)
GFR: 75.47 mL/min (ref >60.00)
Glucose, Bld: 76 mg/dL (ref 70–99)
Potassium: 3.5 meq/L (ref 3.5–5.1)
Sodium: 139 meq/L (ref 135–145)
Total Bilirubin: 0.8 mg/dL (ref 0.2–1.2)
Total Protein: 6.3 g/dL (ref 6.0–8.3)

## 2023-10-30 LAB — TSH: TSH: 1.1 u[IU]/mL (ref 0.35–5.50)

## 2023-10-30 LAB — CBC WITH DIFFERENTIAL/PLATELET
Basophils Absolute: 0.1 10*3/uL (ref 0.0–0.1)
Basophils Relative: 0.8 % (ref 0.0–3.0)
Eosinophils Absolute: 0.1 10*3/uL (ref 0.0–0.7)
Eosinophils Relative: 1.8 % (ref 0.0–5.0)
HCT: 40.1 % (ref 39.0–52.0)
Hemoglobin: 13.5 g/dL (ref 13.0–17.0)
Lymphocytes Relative: 14.7 % (ref 12.0–46.0)
Lymphs Abs: 1.1 10*3/uL (ref 0.7–4.0)
MCHC: 33.6 g/dL (ref 30.0–36.0)
MCV: 95.5 fL (ref 78.0–100.0)
Monocytes Absolute: 0.7 10*3/uL (ref 0.1–1.0)
Monocytes Relative: 9.2 % (ref 3.0–12.0)
Neutro Abs: 5.3 10*3/uL (ref 1.4–7.7)
Neutrophils Relative %: 73.5 % (ref 43.0–77.0)
Platelets: 148 10*3/uL — ABNORMAL LOW (ref 150.0–400.0)
RBC: 4.2 Mil/uL — ABNORMAL LOW (ref 4.22–5.81)
RDW: 14.1 % (ref 11.5–15.5)
WBC: 7.2 10*3/uL (ref 4.0–10.5)

## 2023-10-30 LAB — HEMOGLOBIN A1C: Hgb A1c MFr Bld: 5.4 % (ref 4.6–6.5)

## 2023-10-30 MED ORDER — LEVALBUTEROL TARTRATE 45 MCG/ACT IN AERO: 1.0000 | INHALATION_SPRAY | Freq: Four times a day (QID) | RESPIRATORY_TRACT | 1 refills | Status: DC | PRN

## 2023-10-30 MED ORDER — WEGOVY 0.25 MG/0.5ML ~~LOC~~ SOAJ: 0.2500 mg | SUBCUTANEOUS | 1 refills | Status: DC

## 2023-10-30 MED ORDER — BENZONATATE 100 MG PO CAPS: 100.0000 mg | ORAL_CAPSULE | Freq: Two times a day (BID) | ORAL | 1 refills | Status: DC | PRN

## 2023-10-30 MED ORDER — CEFDINIR 300 MG PO CAPS: 300.0000 mg | ORAL_CAPSULE | Freq: Two times a day (BID) | ORAL | 0 refills | Status: AC

## 2023-10-30 NOTE — Patient Instructions (Signed)

## 2023-10-30 NOTE — Telephone Encounter (Signed)
PA initiated via Covermymeds; KEY: [id number].   PA approved.   PA Case: [id number], Status: Approved, Coverage Starts on: 10/08/2023 12:00:00 AM, Coverage Ends on: 10/06/2024 12:00:00 AM. Questions? Contact [phone]. Authorization Expiration Date: 10/05/2024

## 2023-10-30 NOTE — Telephone Encounter (Signed)
PA initiated via Covermymeds; KEY: [id number].   PA approved. This authorization is good until 10/06/2024. (Approved for CAD, hx of stroke)

## 2023-10-31 ENCOUNTER — Encounter: Payer: Self-pay | Admitting: Family

## 2023-10-31 DIAGNOSIS — G20B1 Parkinson's disease with dyskinesia, without mention of fluctuations: Secondary | ICD-10-CM | POA: Diagnosis not present

## 2023-10-31 DIAGNOSIS — D649 Anemia, unspecified: Secondary | ICD-10-CM | POA: Diagnosis not present

## 2023-10-31 DIAGNOSIS — J45909 Unspecified asthma, uncomplicated: Secondary | ICD-10-CM | POA: Diagnosis not present

## 2023-10-31 DIAGNOSIS — I10 Essential (primary) hypertension: Secondary | ICD-10-CM | POA: Diagnosis not present

## 2023-10-31 DIAGNOSIS — E782 Mixed hyperlipidemia: Secondary | ICD-10-CM | POA: Diagnosis not present

## 2023-10-31 DIAGNOSIS — I480 Paroxysmal atrial fibrillation: Secondary | ICD-10-CM | POA: Diagnosis not present

## 2023-10-31 DIAGNOSIS — I2510 Atherosclerotic heart disease of native coronary artery without angina pectoris: Secondary | ICD-10-CM | POA: Diagnosis not present

## 2023-10-31 DIAGNOSIS — M1990 Unspecified osteoarthritis, unspecified site: Secondary | ICD-10-CM | POA: Diagnosis not present

## 2023-10-31 DIAGNOSIS — F411 Generalized anxiety disorder: Secondary | ICD-10-CM | POA: Diagnosis not present

## 2023-11-01 ENCOUNTER — Encounter: Payer: Self-pay | Admitting: Family Medicine

## 2023-11-01 NOTE — Progress Notes (Signed)
Subjective:    Patient ID: Walter Brown, male    DOB: 1941-01-16, 83 y.o.   MRN: [id number]  No chief complaint on file.   HPI Discussed the use of AI scribe software for clinical note transcription with the patient, who gave verbal consent to proceed.  History of Present Illness   The patient, a 83 year old with a history of heart disease and sleep apnea, presents with a cough and chest congestion that has been ongoing for several weeks. The patient reports that the cough is mostly in the chest and produces green and yellow sputum. The cough does not disturb the patient's sleep significantly, but the spouse reports that the patient does cough at night. The patient denies having any fevers or chills, but reports feeling a bit tired and achy. The patient also reports having diarrhea for the past few days, which is described as clear water and occurs a couple of times a day. The patient denies any abdominal pain, nausea, vomiting, or poor appetite. The patient has been managing the symptoms with over-the-counter medications like NyQuil and DayQuil, which provide temporary relief. The patient also reports a recent fall that resulted in a cut on the head, but no stitches were required. The patient is currently on blood thinners, which he is planning to stop soon. The patient is also interested in trying [facility] for weight loss.        Past Medical History:  Diagnosis Date   A-fib [facility])    Allergy    Anemia    mild   Arthritis 04/06/2017   Asthma    childhood   ASVD (arteriosclerotic vascular disease) 09/07/2018   Back pain with radiation 04/26/2013   Low back with LLE radiculopathy   Basal cell carcinoma    skin- on nose- basal cell (20 yrs ago) forehead 1 year ago     Sees Dr Elmon Else of dermatology   BPH (benign prostatic hyperplasia)    Bradycardia 06/28/2018   Cataract 10/07/2014   Cerumen impaction 08/10/2012   Chicken pox as child   Coronary artery calcification 11/23/2019    Dysphagia 09/11/2021   Elevated LFTs    Essential hypertension    ETD (Eustachian tube dysfunction), right 01/17/2022   Generalized anxiety disorder    [place] measles as a child   Hemoptysis 01/17/2022   Hyperglycemia 04/25/2020   Hyperlipidemia    Insomnia 02/06/2021   Internal hemorrhoids    Irregular cardiac rhythm 03/10/2018   Lipoma 08/04/2018   Macular degeneration    Major depressive disorder 08/01/2012   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   Nocturia 12/04/2022   Obstructive sleep apnea 11/09/2018   Patient reports mild symptoms; was not prescribed a CPAP machine   Otitis externa 08/10/2012   PAF (paroxysmal atrial fibrillation)    Palpitations 09/07/2018   Parkinson's disease (HCC) 05/19/2019   Recurrent falls 12/04/2022   Thrombocytopenia    TIA (transient ischemic attack) 03/10/2018   Unsteady gait 09/11/2021    Past Surgical History:  Procedure Laterality Date   BELPHAROPTOSIS REPAIR     very young, b/l   EXCISIONAL HEMORRHOIDECTOMY     EYE SURGERY  2017   eyelids   HYDROCELE EXCISION / REPAIR  2012   b/l   SKIN CANCER EXCISION     nose and forehead, basal cell CA   UPPER GASTROINTESTINAL ENDOSCOPY      Family History  Problem Relation Age of Onset   Hypertension Mother  Other Mother        MRSA   Heart disease Mother        stents   ADD / ADHD Mother    Arthritis Mother    Asthma Mother    Depression Mother    Vision loss Mother    Heart disease Father    Hypertension Father    Prostate cancer Father 16   Parkinson's disease Father    Cancer Father 74   Hearing loss Father    Stroke Father    Vision loss Father    Varicose Veins Father    Stroke Maternal Grandmother    Prostate cancer Maternal Grandfather    Asthma Maternal Grandfather    Cancer Maternal Grandfather    Cancer Paternal Grandfather        EYE   Hearing loss Paternal Grandfather    ADD / ADHD Son        ADHD   Other Son 24        part of 1 lung removed- due to infection   Alcohol abuse Son    Depression Son    Alcohol abuse Son    Alcohol abuse Maternal Uncle    Depression Maternal Uncle    Asthma Maternal Uncle    Heart disease Paternal Uncle    Colon cancer Neg Hx    Esophageal cancer Neg Hx    Rectal cancer Neg Hx    Stomach cancer Neg Hx     Social History   Socioeconomic History   Marital status: Married    Spouse name: Not on file   Number of children: 2   Years of education: 16   Highest education level: Bachelor's degree (e.g., BA, AB, BS)  Occupational History   Occupation: Retired  Tobacco Use   Smoking status: Former    Current packs/day: 0.00    Average packs/day: 1.5 packs/day for 15.0 years (22.5 ttl pk-yrs)    Types: Cigarettes    Start date: 10/06/1978    Quit date: 10/07/1978    Years since quitting: 45.0   Smokeless tobacco: Never   Tobacco comments:    QUIT 1980  Vaping Use   Vaping status: Never Used  Substance and Sexual Activity   Alcohol use: Yes    Alcohol/week: 7.0 standard drinks of alcohol    Types: 7 Glasses of wine per week    Comment: wine with meals   Drug use: Not Currently   Sexual activity: Yes    Comment: lives with wife, 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  Other Topics Concern   Not on file  Social History Narrative   Right handed    Occasionally caffeine   Retired   Lives with wife   Social Drivers of Corporate investment banker Strain: Low Risk  (03/26/2022)   Overall Financial Resource Strain (CARDIA)    Difficulty of Paying Living Expenses: Not hard at all  Food Insecurity: No Food Insecurity (04/01/2023)   Hunger Vital Sign    Worried About Running Out of Food in the Last Year: Never true    Ran Out of Food in the Last Year: Never true  Transportation Needs: No Transportation Needs (04/01/2023)   PRAPARE - Administrator, Civil Service (Medical): No    Lack of Transportation  (Non-Medical): No  Physical Activity: Sufficiently Active (03/26/2022)   Exercise Vital Sign    Days of Exercise per Week: 5 days  Minutes of Exercise per Session: 50 min  Stress: Stress Concern Present (03/26/2022)   [employer] of Occupational Health - Occupational Stress Questionnaire    Feeling of Stress : To some extent  Social Connections: Moderately Isolated (03/26/2022)   Social Connection and Isolation Panel [NHANES]    Frequency of Communication with Friends and Family: More than three times a week    Frequency of Social Gatherings with Friends and Family: Once a week    Attends Religious Services: Never    Database administrator or Organizations: No    Attends Banker Meetings: Never    Marital Status: Married  Catering manager Violence: Not At Risk (03/26/2022)   Humiliation, Afraid, Rape, and Kick questionnaire    Fear of Current or Ex-Partner: No    Emotionally Abused: No    Physically Abused: No    Sexually Abused: No    Outpatient Medications Prior to Visit  Medication Sig Dispense Refill   acetaminophen (TYLENOL) 500 MG tablet Take 2 tablets (1,000 mg total) by mouth every 6 (six) hours as needed for moderate pain (pain score 4-6). 90 tablet 0   amiodarone (PACERONE) 200 MG tablet TAKE 1/2 TABLET BY MOUTH DAILY 45 tablet 3   calcium-vitamin D (OSCAL WITH D) 500-5 MG-MCG tablet Take 1 tablet by mouth.     carbidopa-levodopa (SINEMET CR) 50-200 MG tablet TAKE 1 TABLET BY MOUTH EVERYDAY AT BEDTIME 90 tablet 0   carbidopa-levodopa (SINEMET IR) 25-100 MG tablet 2 at 7am, 2 at 11am, for now continue 1 at 3pm, 7pm     divalproex (DEPAKOTE) 125 MG DR tablet TAKE 1 TABLET (125 MG TOTAL) BY MOUTH AT BEDTIME 90 tablet 3   ELIQUIS 5 MG TABS tablet TAKE 1 TABLET BY MOUTH TWICE A DAY 180 tablet 1   ezetimibe (ZETIA) 10 MG tablet TAKE 1 TABLET BY MOUTH EVERY DAY 90 tablet 3   hydrochlorothiazide (HYDRODIURIL) 25 MG tablet TAKE 1 TABLET BY MOUTH EVERY DAY 90 tablet  1   Melatonin 10 MG TABS Take 10 mg by mouth daily as needed.     mirabegron ER (MYRBETRIQ) 50 MG TB24 tablet Take 50 mg by mouth daily.     Multiple Vitamins-Minerals (ICAPS AREDS 2 PO) Take 1 capsule by mouth in the morning and at bedtime.     pantoprazole (PROTONIX) 40 MG tablet Take 1 tablet (40 mg total) by mouth daily as needed.     sertraline (ZOLOFT) 100 MG tablet TAKE 1 TABLET BY MOUTH EVERY DAY 90 tablet 1   tamsulosin (FLOMAX) 0.4 MG CAPS capsule Take 1 capsule (0.4 mg total) by mouth daily.     No facility-administered medications prior to visit.    Allergies  Allergen Reactions   Albuterol Sulfate Palpitations   Azithromycin Other (See Comments)    Hepatotoxicity   Doxycycline Hyclate Other (See Comments)    Taken with Azithromycin and had Heaptotoxicity    Review of Systems  Constitutional:  Positive for malaise/fatigue. Negative for fever.  HENT:  Positive for congestion and sore throat.   Eyes:  Negative for blurred vision.  Respiratory:  Positive for cough, sputum production and wheezing. Negative for shortness of breath.   Cardiovascular:  Negative for chest pain, palpitations and leg swelling.  Gastrointestinal:  Positive for diarrhea. Negative for abdominal pain, blood in stool, melena and nausea.  Genitourinary:  Negative for dysuria and frequency.  Musculoskeletal:  Negative for falls.  Skin:  Negative for rash.  Neurological:  Negative  for dizziness, loss of consciousness and headaches.  Endo/Heme/Allergies:  Negative for environmental allergies.  Psychiatric/Behavioral:  Negative for depression. The patient is not nervous/anxious.        Objective:    Physical Exam Vitals reviewed.  Constitutional:      Appearance: Normal appearance. He is not ill-appearing.  HENT:     Head: Normocephalic and atraumatic.     Nose: Nose normal.  Eyes:     Conjunctiva/sclera: Conjunctivae normal.  Cardiovascular:     Rate and Rhythm: Normal rate.     Pulses:  Normal pulses.     Heart sounds: Normal heart sounds.  Pulmonary:     Effort: Pulmonary effort is normal.     Breath sounds: Rhonchi present. No wheezing.     Comments: Bilateral bases Abdominal:     Palpations: Abdomen is soft. There is no mass.     Tenderness: There is no abdominal tenderness.  Musculoskeletal:     Cervical back: Normal range of motion.     Right lower leg: No edema.     Left lower leg: No edema.  Skin:    General: Skin is warm and dry.  Neurological:     General: No focal deficit present.     Mental Status: He is alert and oriented to person, place, and time.  Psychiatric:        Mood and Affect: Mood normal.     BP 130/72 (BP Location: Right Arm, Patient Position: Sitting, Cuff Size: Normal)   Pulse 75   Temp (!) 97.5 F (36.4 C) (Oral)   Resp 18   Ht 5\' 5"  (1.651 m)   Wt 171 lb (77.6 kg) Comment: Pt stated  SpO2 97%   BMI 28.46 kg/m  Wt Readings from Last 3 Encounters:  10/30/23 171 lb (77.6 kg)  10/06/23 172 lb (78 kg)  08/13/23 172 lb (78 kg)    Diabetic Foot Exam - Simple   No data filed    Lab Results  Component Value Date   WBC 7.2 10/30/2023   HGB 13.5 10/30/2023   HCT 40.1 10/30/2023   PLT 148.0 (L) 10/30/2023   GLUCOSE 76 10/30/2023   CHOL 158 10/30/2023   TRIG 84.0 10/30/2023   HDL 63.60 10/30/2023   LDLCALC 78 10/30/2023   ALT 6 10/30/2023   AST 16 10/30/2023   NA 139 10/30/2023   K 3.5 10/30/2023   CL 101 10/30/2023   CREATININE 0.94 10/30/2023   BUN 18 10/30/2023   CO2 31 10/30/2023   TSH 1.10 10/30/2023   INR 1.0 12/15/2017   HGBA1C 5.4 10/30/2023    Lab Results  Component Value Date   TSH 1.10 10/30/2023   Lab Results  Component Value Date   WBC 7.2 10/30/2023   HGB 13.5 10/30/2023   HCT 40.1 10/30/2023   MCV 95.5 10/30/2023   PLT 148.0 (L) 10/30/2023   Lab Results  Component Value Date   NA 139 10/30/2023   K 3.5 10/30/2023   CO2 31 10/30/2023   GLUCOSE 76 10/30/2023   BUN 18 10/30/2023    CREATININE 0.94 10/30/2023   BILITOT 0.8 10/30/2023   ALKPHOS 54 10/30/2023   AST 16 10/30/2023   ALT 6 10/30/2023   PROT 6.3 10/30/2023   ALBUMIN 4.1 10/30/2023   CALCIUM 9.1 10/30/2023   ANIONGAP 7 10/06/2023   EGFR 74 07/09/2023   GFR 75.47 10/30/2023   Lab Results  Component Value Date   CHOL 158 10/30/2023   Lab  Results  Component Value Date   HDL 63.60 10/30/2023   Lab Results  Component Value Date   LDLCALC 78 10/30/2023   Lab Results  Component Value Date   TRIG 84.0 10/30/2023   Lab Results  Component Value Date   CHOLHDL 2 10/30/2023   Lab Results  Component Value Date   HGBA1C 5.4 10/30/2023       Assessment & Plan:  Mixed hyperlipidemia -     Lipid panel  Hyperglycemia -     Hemoglobin A1c  Essential hypertension -     CBC with Differential/Platelet -     Comprehensive metabolic panel -     TSH  Other orders -     UEAVWU; Inject 0.25 mg into the skin once a week.  Dispense: 2 mL; Refill: 1 -     Levalbuterol Tartrate; Inhale 1-2 puffs into the lungs every 6 (six) hours as needed for wheezing.  Dispense: 1 each; Refill: 1 -     Cefdinir; Take 1 capsule (300 mg total) by mouth 2 (two) times daily for 10 days.  Dispense: 20 capsule; Refill: 0 -     Benzonatate; Take 1-2 capsules (100-200 mg total) by mouth 2 (two) times daily as needed for cough.  Dispense: 40 capsule; Refill: 1    Assessment and Plan    Respiratory Infection Several weeks of chest congestion and cough with green-yellow sputum. No high-grade fevers or dyspnea. No significant wheezing on examination. -Start Cefdinir twice daily. -Provide Tessalon Perles (benzonatate) for cough as needed.  Diarrhea Recent onset of watery diarrhea, a couple of times a day. No blood, severe abdominal pain, or eating difficulties. -Monitor symptoms.  Wheezing Occasional wheezing episodes, no current wheezing on examination. History of palpitations with Albuterol. -Provide Xopenex inhaler as  needed for wheezing episodes.  Weight Management Discussion of potential use of Wegovy (semaglutide) for weight management. -Submit prescription for [facility] and monitor insurance coverage and patient tolerance.  Neck Pain and Dizziness Recent onset, potential nerve impingement. Current referral to neurology pending. -Consider second opinion from Dr. Venetia Night if no response from current neurologist.  General Health Maintenance -Discontinue Eliquis as per Dr. Hazle Coca recommendation. -Order complete blood work including potassium levels and cholesterol. -Follow-up appointment on February 3rd, 2025 to review progress and lab results.         Danise Edge, MD

## 2023-11-04 ENCOUNTER — Other Ambulatory Visit: Payer: Self-pay

## 2023-11-04 ENCOUNTER — Emergency Department (HOSPITAL_BASED_OUTPATIENT_CLINIC_OR_DEPARTMENT_OTHER): Payer: Medicare HMO

## 2023-11-04 ENCOUNTER — Telehealth: Payer: Self-pay

## 2023-11-04 ENCOUNTER — Emergency Department (HOSPITAL_BASED_OUTPATIENT_CLINIC_OR_DEPARTMENT_OTHER)
Admission: EM | Admit: 2023-11-04 | Discharge: 2023-11-04 | Disposition: A | Discharge status: Home / Self Care | Payer: Medicare HMO | Attending: Emergency Medicine | Admitting: Emergency Medicine

## 2023-11-04 ENCOUNTER — Encounter (HOSPITAL_BASED_OUTPATIENT_CLINIC_OR_DEPARTMENT_OTHER): Payer: Self-pay | Admitting: Emergency Medicine

## 2023-11-04 DIAGNOSIS — Z7901 Long term (current) use of anticoagulants: Secondary | ICD-10-CM | POA: Insufficient documentation

## 2023-11-04 DIAGNOSIS — Z23 Encounter for immunization: Secondary | ICD-10-CM | POA: Insufficient documentation

## 2023-11-04 DIAGNOSIS — S0101XA Laceration without foreign body of scalp, initial encounter: Secondary | ICD-10-CM | POA: Diagnosis not present

## 2023-11-04 DIAGNOSIS — W19XXXA Unspecified fall, initial encounter: Secondary | ICD-10-CM

## 2023-11-04 DIAGNOSIS — Z043 Encounter for examination and observation following other accident: Secondary | ICD-10-CM | POA: Diagnosis not present

## 2023-11-04 DIAGNOSIS — S0990XA Unspecified injury of head, initial encounter: Secondary | ICD-10-CM | POA: Diagnosis present

## 2023-11-04 DIAGNOSIS — I6523 Occlusion and stenosis of bilateral carotid arteries: Secondary | ICD-10-CM | POA: Diagnosis not present

## 2023-11-04 MED ORDER — TETANUS-DIPHTH-ACELL PERTUSSIS 5-2.5-18.5 LF-MCG/0.5 IM SUSY
0.5000 mL | PREFILLED_SYRINGE | Freq: Once | INTRAMUSCULAR | Status: AC
  Administered 2023-11-04: 0.5 mL via INTRAMUSCULAR
  Filled 2023-11-04: qty 0.5

## 2023-11-04 MED ORDER — BACITRACIN ZINC 500 UNIT/GM EX OINT: 1.0000 | TOPICAL_OINTMENT | Freq: Two times a day (BID) | CUTANEOUS | 0 refills | Status: DC

## 2023-11-04 NOTE — ED Notes (Signed)
Wound cleaned. Provider notified.

## 2023-11-04 NOTE — ED Provider Notes (Signed)
[place] [facility] AT [facility] Provider Note   CSN: [id number] Arrival date & time: 11/04/23  1548     History  Chief Complaint  Patient presents with   Head Injury    Walter Brown is a 83 y.o. male with A-fib on Eliquis who presents with head laceration when he was riding his indoor scooter and lost balance and hit his head on a wooden board.  He did not lose consciousness.  He denies any headache, blurry or double vision, no extremity weakness or numbness.  Accompanied by his wife who reports he has been behaving at baseline without changes in his speech or persistent vomiting.  He is not up-to-date on his tetanus.   Head Injury      Home Medications Prior to Admission medications   Medication Sig Start Date End Date Taking? Authorizing Provider  bacitracin ointment Apply 1 Application topically 2 (two) times daily. 11/04/23  Yes Halford Decamp, PA-C  acetaminophen (TYLENOL) 500 MG tablet Take 2 tablets (1,000 mg total) by mouth every 6 (six) hours as needed for moderate pain (pain score 4-6). 08/13/23   Fayrene Helper, PA-C  amiodarone (PACERONE) 200 MG tablet TAKE 1/2 TABLET BY MOUTH DAILY 10/02/23   Runell Gess, MD  benzonatate (TESSALON PERLES) 100 MG capsule Take 1-2 capsules (100-200 mg total) by mouth 2 (two) times daily as needed for cough. 10/30/23   [name], MD  calcium-vitamin D (OSCAL WITH D) 500-5 MG-MCG tablet Take 1 tablet by mouth.    [provider]  carbidopa-levodopa (SINEMET CR) 50-200 MG tablet TAKE 1 TABLET BY MOUTH EVERYDAY AT BEDTIME 10/02/23   Tat, Rebecca S, DO  carbidopa-levodopa (SINEMET IR) 25-100 MG tablet 2 at 7am, 2 at 11am, for now continue 1 at 3pm, 7pm 10/23/23   Tat, Lurena Joiner S, DO  cefdinir (OMNICEF) 300 MG capsule Take 1 capsule (300 mg total) by mouth 2 (two) times daily for 10 days. 10/30/23 11/09/23  [name], MD  divalproex (DEPAKOTE) 125 MG DR tablet TAKE 1 TABLET (125 MG TOTAL) BY MOUTH  AT BEDTIME 10/29/23   Wertman, Sung Amabile, PA-C  ELIQUIS 5 MG TABS tablet TAKE 1 TABLET BY MOUTH TWICE A DAY 02/27/23   Runell Gess, MD  ezetimibe (ZETIA) 10 MG tablet TAKE 1 TABLET BY MOUTH EVERY DAY 02/27/23   Alver Sorrow, NP  hydrochlorothiazide (HYDRODIURIL) 25 MG tablet TAKE 1 TABLET BY MOUTH EVERY DAY 10/02/23   [name], MD  levalbuterol [facility] HFA) 45 MCG/ACT inhaler Inhale 1-2 puffs into the lungs every 6 (six) hours as needed for wheezing. 10/30/23   [name], MD  Melatonin 10 MG TABS Take 10 mg by mouth daily as needed.    [provider]  mirabegron ER (MYRBETRIQ) 50 MG TB24 tablet Take 50 mg by mouth daily.    [provider]  Multiple Vitamins-Minerals (ICAPS AREDS 2 PO) Take 1 capsule by mouth in the morning and at bedtime.    [provider]  pantoprazole (PROTONIX) 40 MG tablet Take 1 tablet (40 mg total) by mouth daily as needed. 07/14/23   Sandford Craze, NP  Semaglutide-Weight Management (WEGOVY) 0.25 MG/0.5ML SOAJ Inject 0.25 mg into the skin once a week. 10/30/23   [name], MD  sertraline (ZOLOFT) 100 MG tablet TAKE 1 TABLET BY MOUTH EVERY DAY 10/02/23   [name], MD  tamsulosin (FLOMAX) 0.4 MG CAPS capsule Take 1 capsule (0.4  mg total) by mouth daily. 07/14/23   Sandford Craze, NP      Allergies    Albuterol sulfate, Azithromycin, and Doxycycline hyclate    Review of Systems   Review of Systems  Skin:  Positive for wound.    Physical Exam Updated Vital Signs BP 120/80   Pulse 83   Temp (!) 97.2 F (36.2 C)   Resp 18   Ht 5\' 5"  (1.651 m)   Wt 77.6 kg   SpO2 96%   BMI 28.47 kg/m  Physical Exam Vitals and nursing note reviewed.  Constitutional:      General: He is not in acute distress.    Appearance: He is well-developed.  HENT:     Head:     Comments: 2 cm head laceration, hemostasis achieved, mildly tender, no step-off or gross deformity, no retained foreign body visualized Eyes:      Conjunctiva/sclera: Conjunctivae normal.  Cardiovascular:     Rate and Rhythm: Normal rate and regular rhythm.     Heart sounds: No murmur heard. Pulmonary:     Effort: Pulmonary effort is normal. No respiratory distress.     Breath sounds: Normal breath sounds.  Musculoskeletal:     Cervical back: Neck supple.  Skin:    General: Skin is warm and dry.     Capillary Refill: Capillary refill takes less than 2 seconds.  Neurological:     Mental Status: He is alert.  Psychiatric:        Mood and Affect: Mood normal.     ED Results / Procedures / Treatments   Labs (all labs ordered are listed, but only abnormal results are displayed) Labs Reviewed - No data to display  EKG None  Radiology CT Head Wo Contrast Result Date: 11/04/2023 CLINICAL DATA:  Larey Seat off scooter and hit bed rail w/lt side of head. Laceration. EXAM: CT HEAD WITHOUT CONTRAST CT CERVICAL SPINE WITHOUT CONTRAST TECHNIQUE: Multidetector CT imaging of the head and cervical spine was performed following the standard protocol without intravenous contrast. Multiplanar CT image reconstructions of the cervical spine were also generated. RADIATION DOSE REDUCTION: This exam was performed according to the departmental dose-optimization program which includes automated exposure control, adjustment of the mA and/or kV according to patient size and/or use of iterative reconstruction technique. COMPARISON:  CT head and C-spine 10/06/2023 FINDINGS: CT HEAD FINDINGS Brain: Stable prominence of the lateral ventricles may be related to central predominant atrophy, although a component of normal pressure/communicating hydrocephalus cannot be excluded. Patchy and confluent areas of decreased attenuation are noted throughout the deep and periventricular white matter of the cerebral hemispheres bilaterally, compatible with chronic microvascular ischemic disease. No evidence of large-territorial acute infarction. No parenchymal hemorrhage. No mass  lesion. No extra-axial collection. No mass effect or midline shift. No hydrocephalus. Basilar cisterns are patent. Vascular: No hyperdense vessel. Atherosclerotic calcifications are present within the cavernous internal carotid arteries. Skull: No acute fracture or focal lesion. Sinuses/Orbits: Paranasal sinuses and mastoid air cells are clear. The orbits are unremarkable. Other: None. CT CERVICAL SPINE FINDINGS Alignment: Grade 1 anterolisthesis stable of C4 on C5. reversal of normal cervical lordosis centered at the C5-C6 level likely due to positioning and degenerative changes. Skull base and vertebrae: Multilevel moderate degenerative changes spine. No associated severe osseous central canal stenosis. Severe bilateral osseous neural foraminal stenosis at the C2-C3 and C3-C4 level. No acute fracture. No aggressive appearing focal osseous lesion or focal pathologic process. Soft tissues and spinal canal: No prevertebral fluid or  swelling. No visible canal hematoma. Upper chest: Unremarkable. Other: None. IMPRESSION: 1.  No acute intracranial abnormality. 2. No acute displaced fracture or traumatic listhesis of the cervical spine. 3. Stable prominence of the lateral ventricles may be related to central predominant atrophy, although a component of normal pressure/communicating hydrocephalus cannot be excluded. Electronically Signed   By: Tish Frederickson M.D.   On: 11/04/2023 17:19   CT Cervical Spine Wo Contrast Result Date: 11/04/2023 CLINICAL DATA:  Larey Seat off scooter and hit bed rail w/lt side of head. Laceration. EXAM: CT HEAD WITHOUT CONTRAST CT CERVICAL SPINE WITHOUT CONTRAST TECHNIQUE: Multidetector CT imaging of the head and cervical spine was performed following the standard protocol without intravenous contrast. Multiplanar CT image reconstructions of the cervical spine were also generated. RADIATION DOSE REDUCTION: This exam was performed according to the departmental dose-optimization program which  includes automated exposure control, adjustment of the mA and/or kV according to patient size and/or use of iterative reconstruction technique. COMPARISON:  CT head and C-spine 10/06/2023 FINDINGS: CT HEAD FINDINGS Brain: Stable prominence of the lateral ventricles may be related to central predominant atrophy, although a component of normal pressure/communicating hydrocephalus cannot be excluded. Patchy and confluent areas of decreased attenuation are noted throughout the deep and periventricular white matter of the cerebral hemispheres bilaterally, compatible with chronic microvascular ischemic disease. No evidence of large-territorial acute infarction. No parenchymal hemorrhage. No mass lesion. No extra-axial collection. No mass effect or midline shift. No hydrocephalus. Basilar cisterns are patent. Vascular: No hyperdense vessel. Atherosclerotic calcifications are present within the cavernous internal carotid arteries. Skull: No acute fracture or focal lesion. Sinuses/Orbits: Paranasal sinuses and mastoid air cells are clear. The orbits are unremarkable. Other: None. CT CERVICAL SPINE FINDINGS Alignment: Grade 1 anterolisthesis stable of C4 on C5. reversal of normal cervical lordosis centered at the C5-C6 level likely due to positioning and degenerative changes. Skull base and vertebrae: Multilevel moderate degenerative changes spine. No associated severe osseous central canal stenosis. Severe bilateral osseous neural foraminal stenosis at the C2-C3 and C3-C4 level. No acute fracture. No aggressive appearing focal osseous lesion or focal pathologic process. Soft tissues and spinal canal: No prevertebral fluid or swelling. No visible canal hematoma. Upper chest: Unremarkable. Other: None. IMPRESSION: 1.  No acute intracranial abnormality. 2. No acute displaced fracture or traumatic listhesis of the cervical spine. 3. Stable prominence of the lateral ventricles may be related to central predominant atrophy,  although a component of normal pressure/communicating hydrocephalus cannot be excluded. Electronically Signed   By: Tish Frederickson M.D.   On: 11/04/2023 17:19    Procedures .Laceration Repair  Date/Time: 11/04/2023 7:24 PM  Performed by: Halford Decamp, PA-C Authorized by: Halford Decamp, PA-C   Consent:    Consent obtained:  Verbal   Consent given by:  Patient   Risks, benefits, and alternatives were discussed: yes     Risks discussed:  Pain and poor wound healing   Alternatives discussed:  No treatment Universal protocol:    Procedure explained and questions answered to patient or proxy's satisfaction: yes     Patient identity confirmed:  Verbally with patient and arm band Anesthesia:    Anesthesia method:  None Laceration details:    Location:  Scalp   Scalp location:  Crown   Length (cm):  2 Treatment:    Area cleansed with:  Povidone-iodine and saline   Irrigation solution:  Sterile saline Skin repair:    Repair method:  Staples   Number of staples:  2 Approximation:    Approximation:  Loose Post-procedure details:    Procedure completion:  Tolerated     Medications Ordered in ED Medications  Tdap (BOOSTRIX) injection 0.5 mL (0.5 mLs Intramuscular Given 11/04/23 1829)    ED Course/ Medical Decision Making/ A&P                                 Medical Decision Making Amount and/or Complexity of Data Reviewed Radiology: ordered.   This patient presents to the ED with chief complaint(s) of head injury.  The complaint involves an extensive differential diagnosis and also carries with it a high risk of complications and morbidity.   pertinent past medical history as listed in HPI  The differential diagnosis includes  Simple laceration, fracture, intracranial hemorrhage, cervical spine injury The initial plan is to  Will start with CT head and cervical spine Additional history obtained: Additional history obtained from spouse  Initial Assessment:    Patient presents with head laceration.  He is on Eliquis.  However CT head is without any evidence of intracranial hemorrhage.  He has no cervical midline tenderness, full range of motion.  CT cervical spine is without acute process.  His neuroexam is without any deficits.  He is overall asymptomatic and behaving at baseline.  Laceration is not currently bleeding, however appears that it will benefit from staples.  Offered patient lidocaine plus staples versus staples alone.  He would prefer the latter. Independent ECG interpretation:  none  Independent labs interpretation:  The following labs were independently interpreted:  none  Independent visualization and interpretation of imaging: I independently visualized the following imaging with scope of interpretation limited to determining acute life threatening conditions related to emergency care: CT head and cervical spine, which revealed no acute abnormality  Treatment and Reassessment: Patient's laceration irrigated and stapled closed.  Consultations obtained:   none  Disposition:   Patient will be discharged home.  Will return in 7 to 10 days for staple removal. The patient has been appropriately medically screened and/or stabilized in the ED. I have low suspicion for any other emergent medical condition which would require further screening, evaluation or treatment in the ED or require inpatient management. At time of discharge the patient is hemodynamically stable and in no acute distress. I have discussed work-up results and diagnosis with patient and answered all questions. Patient is agreeable with discharge plan. We discussed strict return precautions for returning to the emergency department and they verbalized understanding.     Social Determinants of Health:   none  This note was dictated with voice recognition software.  Despite best efforts at proofreading, errors may have occurred which can change the documentation meaning.           Final Clinical Impression(s) / ED Diagnoses Final diagnoses:  Fall, initial encounter  Laceration of scalp, initial encounter    Rx / DC Orders ED Discharge Orders          Ordered    bacitracin ointment  2 times daily        11/04/23 1910              Fabienne Bruns 11/04/23 1929    Derwood Kaplan, MD 11/04/23 2130

## 2023-11-04 NOTE — Discharge Instructions (Addendum)
You were evaluated in the emergency room for head laceration.  Your head CT and cervical spine CT did not show any acute abnormality.  There is no head bleed or fracture.  Your laceration was stapled closed.  And you were given a tetanus booster.  Please return in 7 to 10 days for staple removal.  If you experience any new or worsening symptoms including dizziness, blurry vision, weakness, persistent vomiting please return to the emergency room.

## 2023-11-04 NOTE — Telephone Encounter (Signed)
Copied from CRM [id number]. Topic: Clinical - Prescription Issue >> Nov 04, 2023 10:43 AM Elizebeth Brooking wrote: Reason for CRM: Patient called in regarding the prescription Semaglutide-Weight Management (WEGOVY) 0.25 MG/0.5ML SOAJ , stated when he got to the pharmacy the price was $700 he stated he was told to call back if it was to expensive is requesting a callback

## 2023-11-04 NOTE — ED Triage Notes (Signed)
Pt hit head on wooden footstool while transitioning from scooter to bed today; denies LOC; takes Eliquis; lac to LT side head, no active bleeding

## 2023-11-05 ENCOUNTER — Telehealth: Payer: Self-pay

## 2023-11-05 ENCOUNTER — Ambulatory Visit: Payer: Medicare HMO | Attending: Cardiovascular Disease | Admitting: Cardiovascular Disease

## 2023-11-05 ENCOUNTER — Encounter: Payer: Self-pay | Admitting: Cardiovascular Disease

## 2023-11-05 ENCOUNTER — Encounter: Payer: PPO | Admitting: Psychology

## 2023-11-05 VITALS — BP 114/74 | HR 60 | Ht 65.0 in | Wt 172.0 lb

## 2023-11-05 DIAGNOSIS — G459 Transient cerebral ischemic attack, unspecified: Secondary | ICD-10-CM

## 2023-11-05 DIAGNOSIS — I480 Paroxysmal atrial fibrillation: Secondary | ICD-10-CM

## 2023-11-05 DIAGNOSIS — E782 Mixed hyperlipidemia: Secondary | ICD-10-CM

## 2023-11-05 DIAGNOSIS — I2510 Atherosclerotic heart disease of native coronary artery without angina pectoris: Secondary | ICD-10-CM | POA: Diagnosis not present

## 2023-11-05 DIAGNOSIS — I10 Essential (primary) hypertension: Secondary | ICD-10-CM

## 2023-11-05 DIAGNOSIS — I7090 Unspecified atherosclerosis: Secondary | ICD-10-CM

## 2023-11-05 NOTE — Patient Instructions (Signed)
Medication Instructions:   STOP ELIQUIS  *If you need a refill on your cardiac medications before your next appointment, please call your pharmacy*    Follow-Up: At [facility], you and your health needs are our priority.  As part of our continuing mission to provide you with exceptional heart care, we have created designated Provider Care Teams.  These Care Teams include your primary Cardiologist (physician) and Advanced Practice Providers (APPs -  Physician Assistants and Nurse Practitioners) who all work together to provide you with the care you need, when you need it.  We recommend signing up for the patient portal called "MyChart".  Sign up information is provided on this After Visit Summary.  MyChart is used to connect with patients for Virtual Visits (Telemedicine).  Patients are able to view lab/test results, encounter notes, upcoming appointments, etc.  Non-urgent messages can be sent to your provider as well.   To learn more about what you can do with MyChart, go to [email or web address].    Your next appointment:   12 month(s)  Provider:   Nanetta Batty, MD

## 2023-11-05 NOTE — Assessment & Plan Note (Signed)
History of hyperlipidemia on Zetia with lipid profile performed 10/30/2023 revealed cholesterol 158, LDL of 78 and HDL of 63.

## 2023-11-05 NOTE — Assessment & Plan Note (Signed)
History of PAF on amiodarone and Eliquis.  Because of increasing frequency of falls probably related to instability due to his Parkinson's disease we have decided mutually based on "risk/benefit" to discontinue his Eliquis.

## 2023-11-05 NOTE — Progress Notes (Signed)
11/05/2023 [name]   [birth year]  [id number]  Primary Physician [name], MD Primary Cardiologist: Runell Gess MD Nicholes Calamity, [place]  HPI:  Walter Brown is a 83 y.o.  thin appearing married Caucasian male father of 2 children (1 is a Engineer, civil (consulting) and other is a Veterinary surgeon), with no grandchildren, who was self-referred to be established in my practice in transfer from Dr. Tomie China.  His primary care provider is Dr. Rogelia Rohrer.  I last saw him in the office 08/13/2022.  He is accompanied by his wife Alan Ripper today.  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  He stopped smoking back in 1982.  He has treated hypertension hyperlipidemia.  There is no family history for heart disease.  Never had a heart attack or stroke.  He walks several miles a day with his wife and occasionally notices increased heart rate on his apple watch.  Does have Parkinson's disease on Sinemet.  He is on amiodarone and Eliquis for his PA flutter.  His Parkinson disease clearly is his biggest issue that he complains of.  He did have an episode of what sounds like PAF a year ago but this is fairly rare phenomenon for him.  He denies chest pain or shortness of breath.  He did have a chest CT performed 09/24/2019 that showed heavy coronary calcification but a negative exercise Myoview stress test 04/23/2018.   Since I saw him in the office a little over a year ago he continues to have progressive symptoms of his Parkinson's disease.  They are these are manifested principally by instability.  He uses a scooter at home for balance.  He recently had a mechanical fall and was seen in the emergency room.  Head CT was negative.  He denies chest pain or shortness of breath.  We had a long discussion of risk versus benefits of continued oral anticoagulation for his PAF versus the risk of intracranial bleeding from mechanical falls and have mutually decided to discontinue his  Eliquis.   Current Meds  Medication Sig   acetaminophen (TYLENOL) 500 MG tablet Take 2 tablets (1,000 mg total) by mouth every 6 (six) hours as needed for moderate pain (pain score 4-6).   amiodarone (PACERONE) 200 MG tablet TAKE 1/2 TABLET BY MOUTH DAILY   bacitracin ointment Apply 1 Application topically 2 (two) times daily.   benzonatate (TESSALON PERLES) 100 MG capsule Take 1-2 capsules (100-200 mg total) by mouth 2 (two) times daily as needed for cough.   calcium-vitamin D (OSCAL WITH D) 500-5 MG-MCG tablet Take 1 tablet by mouth.   carbidopa-levodopa (SINEMET CR) 50-200 MG tablet TAKE 1 TABLET BY MOUTH EVERYDAY AT BEDTIME   carbidopa-levodopa (SINEMET IR) 25-100 MG tablet 2 at 7am, 2 at 11am, for now continue 1 at 3pm, 7pm   cefdinir (OMNICEF) 300 MG capsule Take 1 capsule (300 mg total) by mouth 2 (two) times daily for 10 days.   divalproex (DEPAKOTE) 125 MG DR tablet TAKE 1 TABLET (125 MG TOTAL) BY MOUTH AT BEDTIME   ezetimibe (ZETIA) 10 MG tablet TAKE 1 TABLET BY MOUTH EVERY DAY   hydrochlorothiazide (HYDRODIURIL) 25 MG tablet TAKE 1 TABLET BY MOUTH EVERY DAY   levalbuterol (XOPENEX HFA) 45 MCG/ACT inhaler Inhale 1-2 puffs into the lungs every 6 (six) hours as needed for wheezing.   Melatonin 10 MG TABS Take 10 mg by mouth daily as needed.  mirabegron ER (MYRBETRIQ) 50 MG TB24 tablet Take 50 mg by mouth daily.   Multiple Vitamins-Minerals (ICAPS AREDS 2 PO) Take 1 capsule by mouth in the morning and at bedtime.   pantoprazole (PROTONIX) 40 MG tablet Take 1 tablet (40 mg total) by mouth daily as needed.   Semaglutide-Weight Management (WEGOVY) 0.25 MG/0.5ML SOAJ Inject 0.25 mg into the skin once a week.   sertraline (ZOLOFT) 100 MG tablet TAKE 1 TABLET BY MOUTH EVERY DAY   tamsulosin (FLOMAX) 0.4 MG CAPS capsule Take 1 capsule (0.4 mg total) by mouth daily.   [DISCONTINUED] ELIQUIS 5 MG TABS tablet TAKE 1 TABLET BY MOUTH TWICE A DAY     Allergies  Allergen Reactions   Albuterol  Sulfate Palpitations   Azithromycin Other (See Comments)    Hepatotoxicity   Doxycycline Hyclate Other (See Comments)    Taken with Azithromycin and had Heaptotoxicity    Social History   Socioeconomic History   Marital status: Married    Spouse name: Not on file   Number of children: 2   Years of education: 16   Highest education level: Bachelor's degree (e.g., BA, AB, BS)  Occupational History   Occupation: Retired  Tobacco Use   Smoking status: Former    Current packs/day: 0.00    Average packs/day: 1.5 packs/day for 15.0 years (22.5 ttl pk-yrs)    Types: Cigarettes    Start date: 10/06/1978    Quit date: 10/07/1978    Years since quitting: 45.1   Smokeless tobacco: Never   Tobacco comments:    QUIT 1980  Vaping Use   Vaping status: Never Used  Substance and Sexual Activity   Alcohol use: Yes    Alcohol/week: 7.0 standard drinks of alcohol    Types: 7 Glasses of wine per week    Comment: wine with meals   Drug use: Not Currently   Sexual activity: Yes    Comment: lives with wife, 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  Other Topics Concern   Not on file  Social History Narrative   Right handed    Occasionally caffeine   Retired   Lives with wife   Social Drivers of Corporate investment banker Strain: Low Risk  (03/26/2022)   Overall Financial Resource Strain (CARDIA)    Difficulty of Paying Living Expenses: Not hard at all  Food Insecurity: No Food Insecurity (04/01/2023)   Hunger Vital Sign    Worried About Running Out of Food in the Last Year: Never true    Ran Out of Food in the Last Year: Never true  Transportation Needs: No Transportation Needs (04/01/2023)   PRAPARE - Administrator, Civil Service (Medical): No    Lack of Transportation (Non-Medical): No  Physical Activity: Sufficiently Active (03/26/2022)   Exercise Vital Sign    Days of Exercise per Week: 5 days    Minutes of Exercise per  Session: 50 min  Stress: Stress Concern Present (03/26/2022)   [employer] of Occupational Health - Occupational Stress Questionnaire    Feeling of Stress : To some extent  Social Connections: Moderately Isolated (03/26/2022)   Social Connection and Isolation Panel [NHANES]    Frequency of Communication with Friends and Family: More than three times a week    Frequency of Social Gatherings with Friends and Family: Once a week    Attends Religious Services: Never    Database administrator or Organizations: No  Attends Banker Meetings: Never    Marital Status: Married  Catering manager Violence: Not At Risk (03/26/2022)   Humiliation, Afraid, Rape, and Kick questionnaire    Fear of Current or Ex-Partner: No    Emotionally Abused: No    Physically Abused: No    Sexually Abused: No     Review of Systems: General: negative for chills, fever, night sweats or weight changes.  Cardiovascular: negative for chest pain, dyspnea on exertion, edema, orthopnea, palpitations, paroxysmal nocturnal dyspnea or shortness of breath Dermatological: negative for rash Respiratory: negative for cough or wheezing Urologic: negative for hematuria Abdominal: negative for nausea, vomiting, diarrhea, bright red blood per rectum, melena, or hematemesis Neurologic: negative for visual changes, syncope, or dizziness All other systems reviewed and are otherwise negative except as noted above.    Blood pressure 114/74, pulse 60, height 5\' 5"  (1.651 m), weight 172 lb (78 kg), SpO2 98%.  General appearance: alert and no distress Neck: no adenopathy, no carotid bruit, no JVD, supple, symmetrical, trachea midline, and thyroid not enlarged, symmetric, no tenderness/mass/nodules Lungs: clear to auscultation bilaterally Heart: regular rate and rhythm, S1, S2 normal, no murmur, click, rub or gallop Extremities: extremities normal, atraumatic, no cyanosis or edema Pulses: 2+ and symmetric Skin: Skin  color, texture, turgor normal. No rashes or lesions Neurologic: Grossly normal  EKG EKG Interpretation Date/Time:  Wednesday November 05 2023 10:22:21 EST Ventricular Rate:  60 PR Interval:  146 QRS Duration:  94 QT Interval:  446 QTC Calculation: 446 R Axis:   28  Text Interpretation: Normal sinus rhythm Normal ECG When compared with ECG of 09-Jul-2023 11:34, No significant change was found Confirmed by Nanetta Batty [phone]) on 11/05/2023 10:27:31 AM    ASSESSMENT AND PLAN:   Hyperlipidemia History of hyperlipidemia on Zetia with lipid profile performed 10/30/2023 revealed cholesterol 158, LDL of 78 and HDL of 63.  Essential hypertension History of essential hypertension her blood pressure measured today at 114/74.  He is on hydrochlorothiazide.  PAF (paroxysmal atrial fibrillation) History of PAF on amiodarone and Eliquis.  Because of increasing frequency of falls probably related to instability due to his Parkinson's disease we have decided mutually based on "risk/benefit" to discontinue his Eliquis.  Coronary artery calcification History of coronary calcification by CT scan 09/24/2019.  He did have a negative stress test 04/23/2018.  He is completely asymptomatic.     Runell Gess MD FACP,FACC,FAHA, [facility] 11/05/2023 10:39 AM

## 2023-11-05 NOTE — Telephone Encounter (Signed)
Spoke with pharmacy, this is the cost with insurance so no prior Berkley Harvey is needed.  Left message on machine for patient to call back.   Pt can call insurance to see if they cover any other similar wt loss meds.

## 2023-11-05 NOTE — Assessment & Plan Note (Signed)
History of coronary calcification by CT scan 09/24/2019.  He did have a negative stress test 04/23/2018.  He is completely asymptomatic.

## 2023-11-05 NOTE — Assessment & Plan Note (Signed)
History of essential hypertension her blood pressure measured today at 114/74.  He is on hydrochlorothiazide.

## 2023-11-05 NOTE — Transitions of Care (Post Inpatient/ED Visit) (Signed)
   11/05/2023  Name: Walter Brown MRN: [id number] DOB: 03/10/1941  Today's TOC FU Call Status: Today's TOC FU Call Status:: Unsuccessful Call (1st Attempt) Unsuccessful Call (1st Attempt) Date: 11/05/23  Attempted to reach the patient regarding the most recent Inpatient/ED visit.  Follow Up Plan: Additional outreach attempts will be made to reach the patient to complete the Transitions of Care (Post Inpatient/ED visit) call.   Signature Karena Addison, LPN [facility] Nurse Health Advisor Direct Dial [phone]

## 2023-11-06 DIAGNOSIS — N319 Neuromuscular dysfunction of bladder, unspecified: Secondary | ICD-10-CM | POA: Diagnosis not present

## 2023-11-06 DIAGNOSIS — N401 Enlarged prostate with lower urinary tract symptoms: Secondary | ICD-10-CM | POA: Diagnosis not present

## 2023-11-06 DIAGNOSIS — R3915 Urgency of urination: Secondary | ICD-10-CM | POA: Diagnosis not present

## 2023-11-07 DIAGNOSIS — D649 Anemia, unspecified: Secondary | ICD-10-CM | POA: Diagnosis not present

## 2023-11-07 DIAGNOSIS — J45909 Unspecified asthma, uncomplicated: Secondary | ICD-10-CM | POA: Diagnosis not present

## 2023-11-07 DIAGNOSIS — I480 Paroxysmal atrial fibrillation: Secondary | ICD-10-CM | POA: Diagnosis not present

## 2023-11-07 DIAGNOSIS — I2510 Atherosclerotic heart disease of native coronary artery without angina pectoris: Secondary | ICD-10-CM | POA: Diagnosis not present

## 2023-11-07 DIAGNOSIS — M1990 Unspecified osteoarthritis, unspecified site: Secondary | ICD-10-CM | POA: Diagnosis not present

## 2023-11-07 DIAGNOSIS — F411 Generalized anxiety disorder: Secondary | ICD-10-CM | POA: Diagnosis not present

## 2023-11-07 DIAGNOSIS — E782 Mixed hyperlipidemia: Secondary | ICD-10-CM | POA: Diagnosis not present

## 2023-11-07 DIAGNOSIS — G20B1 Parkinson's disease with dyskinesia, without mention of fluctuations: Secondary | ICD-10-CM | POA: Diagnosis not present

## 2023-11-07 DIAGNOSIS — I10 Essential (primary) hypertension: Secondary | ICD-10-CM | POA: Diagnosis not present

## 2023-11-09 NOTE — Assessment & Plan Note (Signed)
 [id number] acceptable, minimize simple carbs. Increase exercise as tolerated.

## 2023-11-09 NOTE — Assessment & Plan Note (Signed)
 Tolerating statin, encouraged heart healthy diet, avoid trans fats, minimize simple carbs and saturated fats. Increase exercise as tolerated

## 2023-11-09 NOTE — Assessment & Plan Note (Signed)
Following with Neurology but he notes his disease is progressing and he is getting weaker and weaker. His wife is with him and they note ADLs are getting difficult and he is falling more. Discussed him getting a [employer] light weight electric wheelchair to help with activities and he is a good candidate. They will contact the company and we can proceed. He notes PT has helped his strength

## 2023-11-09 NOTE — Assessment & Plan Note (Signed)
Has improved some since recent treatment

## 2023-11-09 NOTE — Assessment & Plan Note (Signed)
 Well controlled, no changes to meds. Encouraged heart healthy diet such as the DASH diet and exercise as tolerated.

## 2023-11-10 ENCOUNTER — Ambulatory Visit (INDEPENDENT_AMBULATORY_CARE_PROVIDER_SITE_OTHER): Payer: Medicare HMO | Admitting: Family Medicine

## 2023-11-10 ENCOUNTER — Ambulatory Visit (HOSPITAL_BASED_OUTPATIENT_CLINIC_OR_DEPARTMENT_OTHER)
Admission: RE | Admit: 2023-11-10 | Discharge: 2023-11-10 | Disposition: A | Discharge status: Home / Self Care | Payer: Medicare HMO | Source: Ambulatory Visit | Attending: Family Medicine | Admitting: Family Medicine

## 2023-11-10 ENCOUNTER — Encounter: Payer: Self-pay | Admitting: Family Medicine

## 2023-11-10 VITALS — BP 136/80 | HR 63 | Temp 97.7°F | Resp 16 | Ht 65.0 in | Wt 182.0 lb

## 2023-11-10 DIAGNOSIS — R052 Subacute cough: Secondary | ICD-10-CM

## 2023-11-10 DIAGNOSIS — G20A1 Parkinson's disease without dyskinesia, without mention of fluctuations: Secondary | ICD-10-CM | POA: Diagnosis not present

## 2023-11-10 DIAGNOSIS — J989 Respiratory disorder, unspecified: Secondary | ICD-10-CM | POA: Diagnosis not present

## 2023-11-10 DIAGNOSIS — R059 Cough, unspecified: Secondary | ICD-10-CM | POA: Diagnosis not present

## 2023-11-10 DIAGNOSIS — K6389 Other specified diseases of intestine: Secondary | ICD-10-CM | POA: Diagnosis not present

## 2023-11-10 DIAGNOSIS — R0989 Other specified symptoms and signs involving the circulatory and respiratory systems: Secondary | ICD-10-CM | POA: Diagnosis not present

## 2023-11-10 DIAGNOSIS — R9389 Abnormal findings on diagnostic imaging of other specified body structures: Secondary | ICD-10-CM | POA: Diagnosis not present

## 2023-11-10 DIAGNOSIS — E782 Mixed hyperlipidemia: Secondary | ICD-10-CM | POA: Diagnosis not present

## 2023-11-10 DIAGNOSIS — R739 Hyperglycemia, unspecified: Secondary | ICD-10-CM

## 2023-11-10 DIAGNOSIS — I10 Essential (primary) hypertension: Secondary | ICD-10-CM | POA: Diagnosis not present
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
## 2023-11-11 ENCOUNTER — Ambulatory Visit: Payer: PPO | Admitting: Physician Assistant

## 2023-11-11 NOTE — Transitions of Care (Post Inpatient/ED Visit) (Signed)
   11/11/2023  Name: Walter Brown MRN: [id number] DOB: [birth year]  Today's TOC FU Call Status: Today's TOC FU Call Status:: Unsuccessful Call (2nd Attempt) Unsuccessful Call (1st Attempt) Date: 11/05/23 Unsuccessful Call (2nd Attempt) Date: 11/11/23  Attempted to reach the patient regarding the most recent Inpatient/ED visit.  Follow Up Plan: Additional outreach attempts will be made to reach the patient to complete the Transitions of Care (Post Inpatient/ED visit) call.   Signature Julian Lemmings, LPN [facility] Nurse Health Advisor Direct Dial [phone]

## 2023-11-11 NOTE — Transitions of Care (Post Inpatient/ED Visit) (Signed)
   11/11/2023  Name: Walter Brown MRN: [id number] DOB: [birth year]  Today's TOC FU Call Status: Today's TOC FU Call Status:: Unsuccessful Call (3rd Attempt) Unsuccessful Call (1st Attempt) Date: 11/05/23 Unsuccessful Call (2nd Attempt) Date: 11/11/23 Unsuccessful Call (3rd Attempt) Date: 11/11/23  Attempted to reach the patient regarding the most recent Inpatient/ED visit.  Follow Up Plan: No further outreach attempts will be made at this time. We have been unable to contact the patient.  Signature Julian Lemmings, LPN [facility] Nurse Health Advisor Direct Dial [phone]

## 2023-11-13 ENCOUNTER — Ambulatory Visit: Payer: Self-pay | Admitting: Family Medicine

## 2023-11-13 DIAGNOSIS — F339 Major depressive disorder, recurrent, unspecified: Secondary | ICD-10-CM | POA: Diagnosis not present

## 2023-11-13 DIAGNOSIS — I10 Essential (primary) hypertension: Secondary | ICD-10-CM | POA: Diagnosis not present

## 2023-11-13 DIAGNOSIS — D649 Anemia, unspecified: Secondary | ICD-10-CM | POA: Diagnosis not present

## 2023-11-13 DIAGNOSIS — I480 Paroxysmal atrial fibrillation: Secondary | ICD-10-CM | POA: Diagnosis not present

## 2023-11-13 DIAGNOSIS — F0284 Dementia in other diseases classified elsewhere, unspecified severity, with anxiety: Secondary | ICD-10-CM | POA: Diagnosis not present

## 2023-11-13 DIAGNOSIS — I2510 Atherosclerotic heart disease of native coronary artery without angina pectoris: Secondary | ICD-10-CM | POA: Diagnosis not present

## 2023-11-13 DIAGNOSIS — F411 Generalized anxiety disorder: Secondary | ICD-10-CM | POA: Diagnosis not present

## 2023-11-13 DIAGNOSIS — G20A1 Parkinson's disease without dyskinesia, without mention of fluctuations: Secondary | ICD-10-CM | POA: Diagnosis not present

## 2023-11-13 DIAGNOSIS — F0283 Dementia in other diseases classified elsewhere, unspecified severity, with mood disturbance: Secondary | ICD-10-CM | POA: Diagnosis not present

## 2023-11-13 NOTE — Telephone Encounter (Signed)
 Called patient and let him know about the status of the medication. He said he would call his insurance company and let us  know if they cover any other weight loss medications

## 2023-11-13 NOTE — Telephone Encounter (Signed)
 2nd attempt to call patient. No answer. LVM  Copied from CRM [id number]. Topic: Clinical - Red Word Triage >> Nov 13, 2023  4:41 PM Corin V wrote: [facility] that prompted transfer to Nurse Triage: Patient had a fall yesterday without injury.

## 2023-11-13 NOTE — Telephone Encounter (Signed)
 1st attempt to call patient. No answer. LVM  Copied from CRM [id number]. Topic: Clinical - Red Word Triage >> Nov 13, 2023  4:41 PM Corin V wrote: [facility] that prompted transfer to Nurse Triage: Patient had a fall yesterday without injury.

## 2023-11-13 NOTE — Telephone Encounter (Signed)
 3rd attempt to call patient. No answer. LVM. Routing to clinic.   Copied from CRM [id number]. Topic: Clinical - Red Word Triage >> Nov 13, 2023  4:41 PM Corin V wrote: [facility] that prompted transfer to Nurse Triage: Patient had a fall yesterday without injury.

## 2023-11-14 ENCOUNTER — Telehealth: Payer: Self-pay

## 2023-11-14 ENCOUNTER — Emergency Department (HOSPITAL_BASED_OUTPATIENT_CLINIC_OR_DEPARTMENT_OTHER)
Admission: EM | Admit: 2023-11-14 | Discharge: 2023-11-14 | Disposition: A | Discharge status: Home / Self Care | Payer: Medicare HMO | Attending: Emergency Medicine | Admitting: Emergency Medicine

## 2023-11-14 ENCOUNTER — Other Ambulatory Visit: Payer: Self-pay

## 2023-11-14 ENCOUNTER — Encounter (HOSPITAL_BASED_OUTPATIENT_CLINIC_OR_DEPARTMENT_OTHER): Payer: Self-pay | Admitting: Emergency Medicine

## 2023-11-14 DIAGNOSIS — G20A1 Parkinson's disease without dyskinesia, without mention of fluctuations: Secondary | ICD-10-CM | POA: Diagnosis not present

## 2023-11-14 DIAGNOSIS — S0101XD Laceration without foreign body of scalp, subsequent encounter: Secondary | ICD-10-CM | POA: Diagnosis not present

## 2023-11-14 DIAGNOSIS — D649 Anemia, unspecified: Secondary | ICD-10-CM | POA: Diagnosis not present

## 2023-11-14 DIAGNOSIS — Z4802 Encounter for removal of sutures: Secondary | ICD-10-CM | POA: Diagnosis not present

## 2023-11-14 DIAGNOSIS — F0283 Dementia in other diseases classified elsewhere, unspecified severity, with mood disturbance: Secondary | ICD-10-CM | POA: Diagnosis not present

## 2023-11-14 DIAGNOSIS — I10 Essential (primary) hypertension: Secondary | ICD-10-CM | POA: Diagnosis not present

## 2023-11-14 DIAGNOSIS — F411 Generalized anxiety disorder: Secondary | ICD-10-CM | POA: Diagnosis not present

## 2023-11-14 DIAGNOSIS — I2510 Atherosclerotic heart disease of native coronary artery without angina pectoris: Secondary | ICD-10-CM | POA: Diagnosis not present

## 2023-11-14 DIAGNOSIS — I480 Paroxysmal atrial fibrillation: Secondary | ICD-10-CM | POA: Diagnosis not present

## 2023-11-14 DIAGNOSIS — F0284 Dementia in other diseases classified elsewhere, unspecified severity, with anxiety: Secondary | ICD-10-CM | POA: Diagnosis not present

## 2023-11-14 DIAGNOSIS — F339 Major depressive disorder, recurrent, unspecified: Secondary | ICD-10-CM | POA: Diagnosis not present

## 2023-11-14 NOTE — Discharge Instructions (Addendum)
 2 staples were removed today.  No signs of infection.  Follow-up with PCP as needed.  Return for any concerning symptoms.

## 2023-11-14 NOTE — ED Triage Notes (Signed)
 Pt POV in wheelchair with spouse- pt here for suture removal from scalp.  Denies concern for infection.

## 2023-11-14 NOTE — Telephone Encounter (Signed)
 Copied from CRM [id number]. Topic: Clinical - Home Health Verbal Orders >> Nov 13, 2023  4:43 PM Corin V wrote: Caller/Agency:[facility] with [facility] Number: [phone] Service Requested: Speech Therapy Frequency: 1 week 2 for 5 weeks Any new concerns about the patient? No  Verbal order was given to [facility] [phone] for  Once a week for 5 weeks Just a FYI

## 2023-11-14 NOTE — ED Provider Notes (Signed)
 [facility] Provider Note   CSN: [id number] Arrival date & time: 11/14/23  1538     History  Chief Complaint  Patient presents with   Suture / Staple Removal    Walter Brown is a 83 y.o. male.  83 year old male presents with his wife for concern of staple removal.  These were placed on 1/28.  He has tube in place.  No signs of infection reported.  Denies any concerning symptoms.  He has no other complaints.  The history is provided by the patient. No language interpreter was used.       Home Medications Prior to Admission medications   Medication Sig Start Date End Date Taking? Authorizing Provider  acetaminophen  (TYLENOL ) 500 MG tablet Take 2 tablets (1,000 mg total) by mouth every 6 (six) hours as needed for moderate pain (pain score 4-6). 08/13/23   Nivia Colon, PA-C  amiodarone  (PACERONE ) 200 MG tablet TAKE 1/2 TABLET BY MOUTH DAILY 10/02/23   Court Dorn PARAS, MD  bacitracin  ointment Apply 1 Application topically 2 (two) times daily. 11/04/23   Donnajean Lynwood DEL, PA-C  benzonatate  (TESSALON  PERLES) 100 MG capsule Take 1-2 capsules (100-200 mg total) by mouth 2 (two) times daily as needed for cough. 10/30/23   Domenica Harlene LABOR, MD  calcium -vitamin D  (OSCAL WITH D) 500-5 MG-MCG tablet Take 1 tablet by mouth.    [provider]  carbidopa -levodopa  (SINEMET  CR) 50-200 MG tablet TAKE 1 TABLET BY MOUTH EVERYDAY AT BEDTIME 10/02/23   Tat, Asberry RAMAN, DO  carbidopa -levodopa  (SINEMET  IR) 25-100 MG tablet 2 at 7am, 2 at 11am, for now continue 1 at 3pm, 7pm 10/23/23   Tat, Rebecca S, DO  divalproex  (DEPAKOTE ) 125 MG DR tablet TAKE 1 TABLET (125 MG TOTAL) BY MOUTH AT BEDTIME 10/29/23   Wertman, Sara E, PA-C  ezetimibe  (ZETIA ) 10 MG tablet TAKE 1 TABLET BY MOUTH EVERY DAY 02/27/23   Walker, Caitlin S, NP  hydrochlorothiazide  (HYDRODIURIL ) 25 MG tablet TAKE 1 TABLET BY MOUTH EVERY DAY 10/02/23   Domenica Harlene LABOR, MD  levalbuterol  (XOPENEX   HFA) 45 MCG/ACT inhaler Inhale 1-2 puffs into the lungs every 6 (six) hours as needed for wheezing. 10/30/23   Domenica Harlene LABOR, MD  Melatonin 10 MG TABS Take 10 mg by mouth daily as needed.    [provider]  mirabegron  ER (MYRBETRIQ ) 50 MG TB24 tablet Take 50 mg by mouth daily.    [provider]  Multiple Vitamins-Minerals (ICAPS AREDS 2 PO) Take 1 capsule by mouth in the morning and at bedtime.    [provider]  pantoprazole  (PROTONIX ) 40 MG tablet Take 1 tablet (40 mg total) by mouth daily as needed. 07/14/23   O'Sullivan, Melissa, NP  Semaglutide -Weight Management (WEGOVY ) 0.25 MG/0.5ML SOAJ Inject 0.25 mg into the skin once a week. 10/30/23   Domenica Harlene LABOR, MD  sertraline  (ZOLOFT ) 100 MG tablet TAKE 1 TABLET BY MOUTH EVERY DAY 10/02/23   Domenica Harlene LABOR, MD  tamsulosin  (FLOMAX ) 0.4 MG CAPS capsule Take 1 capsule (0.4 mg total) by mouth daily. 07/14/23   O'Sullivan, Melissa, NP      Allergies    Albuterol  sulfate, Azithromycin, and Doxycycline hyclate    Review of Systems   Review of Systems  Constitutional:  Negative for chills and fever.  Skin:  Positive for wound.  All other systems reviewed and are negative.   Physical Exam Updated Vital Signs BP (!) 154/81 (BP Location: Left  Arm)   Pulse (!) 59   Temp 98.2 F (36.8 C) (Oral)   Resp 16   Ht 5' 5 (1.651 m)   Wt 82.6 kg   SpO2 99%   BMI 30.29 kg/m  Physical Exam Vitals and nursing note reviewed.  Constitutional:      General: He is not in acute distress.    Appearance: Normal appearance. He is not ill-appearing.  HENT:     Head: Normocephalic and atraumatic.     Comments: Wound to the right side of the scalp.  2 staples placed.  There is a scab.  Otherwise no signs of infection.    Nose: Nose normal.  Eyes:     Conjunctiva/sclera: Conjunctivae normal.  Pulmonary:     Effort: Pulmonary effort is normal. No respiratory distress.  Musculoskeletal:        General: No deformity.  Skin:     Findings: No rash.  Neurological:     Mental Status: He is alert.     ED Results / Procedures / Treatments   Labs (all labs ordered are listed, but only abnormal results are displayed) Labs Reviewed - No data to display  EKG None  Radiology No results found.  Procedures Suture Removal  Date/Time: 11/14/2023 4:42 PM  Performed by: Hildegard Loge, PA-C Authorized by: Hildegard Loge, PA-C   Consent:    Consent obtained:  Verbal   Consent given by:  Patient   Risks discussed:  Bleeding and pain   Alternatives discussed:  No treatment and delayed treatment Universal protocol:    Procedure explained and questions answered to patient or proxy's satisfaction: yes     Relevant documents present and verified: yes     Patient identity confirmed:  Verbally with patient Location:    Location:  Head/neck   Head/neck location:  Scalp Procedure details:    Wound appearance:  No signs of infection   Number of staples removed:  2 Post-procedure details:    Procedure completion:  Tolerated well, no immediate complications     Medications Ordered in ED Medications - No data to display  ED Course/ Medical Decision Making/ A&P                                 Medical Decision Making  83 year old male presents today for staple removal.  Initially placed on 1/28.  2 staples removed.  No signs of infection.  Discussed follow-up with PCP.  Patient discharged in stable condition.  Return precaution discussed.   Final Clinical Impression(s) / ED Diagnoses Final diagnoses:  Encounter for staple removal    Rx / DC Orders ED Discharge Orders     None         Hildegard Loge, PA-C 11/14/23 1644    Geraldene Hamilton, MD 11/14/23 2355

## 2023-11-18 ENCOUNTER — Ambulatory Visit: Payer: Medicare HMO | Admitting: Family Medicine

## 2023-11-18 ENCOUNTER — Telehealth: Payer: Self-pay

## 2023-11-18 NOTE — Transitions of Care (Post Inpatient/ED Visit) (Unsigned)
   11/18/2023  Name: Walter Brown MRN: [id number] DOB: 11/14/1940  Today's TOC FU Call Status: Today's TOC FU Call Status:: Unsuccessful Call (1st Attempt) Unsuccessful Call (1st Attempt) Date: 11/18/23  Attempted to reach the patient regarding the most recent Inpatient/ED visit.  Follow Up Plan: Additional outreach attempts will be made to reach the patient to complete the Transitions of Care (Post Inpatient/ED visit) call.   Signature Karena Addison, LPN [facility] Nurse Health Advisor Direct Dial [phone]

## 2023-11-19 ENCOUNTER — Telehealth: Payer: Self-pay

## 2023-11-19 DIAGNOSIS — F0284 Dementia in other diseases classified elsewhere, unspecified severity, with anxiety: Secondary | ICD-10-CM | POA: Diagnosis not present

## 2023-11-19 DIAGNOSIS — F411 Generalized anxiety disorder: Secondary | ICD-10-CM | POA: Diagnosis not present

## 2023-11-19 DIAGNOSIS — G20A1 Parkinson's disease without dyskinesia, without mention of fluctuations: Secondary | ICD-10-CM | POA: Diagnosis not present

## 2023-11-19 DIAGNOSIS — I10 Essential (primary) hypertension: Secondary | ICD-10-CM | POA: Diagnosis not present

## 2023-11-19 DIAGNOSIS — I480 Paroxysmal atrial fibrillation: Secondary | ICD-10-CM | POA: Diagnosis not present

## 2023-11-19 DIAGNOSIS — I2510 Atherosclerotic heart disease of native coronary artery without angina pectoris: Secondary | ICD-10-CM | POA: Diagnosis not present

## 2023-11-19 DIAGNOSIS — F0283 Dementia in other diseases classified elsewhere, unspecified severity, with mood disturbance: Secondary | ICD-10-CM | POA: Diagnosis not present

## 2023-11-19 DIAGNOSIS — D649 Anemia, unspecified: Secondary | ICD-10-CM | POA: Diagnosis not present

## 2023-11-19 DIAGNOSIS — F339 Major depressive disorder, recurrent, unspecified: Secondary | ICD-10-CM | POA: Diagnosis not present

## 2023-11-19 NOTE — Telephone Encounter (Signed)
Copied from CRM [id number]. Topic: Clinical - Home Health Verbal Orders >> Nov 19, 2023 12:11 PM Sonny Dandy B wrote: Caller/Agency: Efraim Kaufmann from [facility] Callback Number: [phone] Service Requested: Occupational Therapy Frequency:  Any new concerns about the patient? Yes  called to report pt is still struggling to get rid of a cough, from previously having the flu. Pt is unable to sleep at night. Pt wife wants to know what can she do.

## 2023-11-20 ENCOUNTER — Telehealth: Payer: Self-pay | Admitting: Family Medicine

## 2023-11-20 DIAGNOSIS — F411 Generalized anxiety disorder: Secondary | ICD-10-CM | POA: Diagnosis not present

## 2023-11-20 DIAGNOSIS — G20A1 Parkinson's disease without dyskinesia, without mention of fluctuations: Secondary | ICD-10-CM | POA: Diagnosis not present

## 2023-11-20 DIAGNOSIS — I2510 Atherosclerotic heart disease of native coronary artery without angina pectoris: Secondary | ICD-10-CM | POA: Diagnosis not present

## 2023-11-20 DIAGNOSIS — I10 Essential (primary) hypertension: Secondary | ICD-10-CM | POA: Diagnosis not present

## 2023-11-20 DIAGNOSIS — F0283 Dementia in other diseases classified elsewhere, unspecified severity, with mood disturbance: Secondary | ICD-10-CM | POA: Diagnosis not present

## 2023-11-20 DIAGNOSIS — F339 Major depressive disorder, recurrent, unspecified: Secondary | ICD-10-CM | POA: Diagnosis not present

## 2023-11-20 DIAGNOSIS — I480 Paroxysmal atrial fibrillation: Secondary | ICD-10-CM | POA: Diagnosis not present

## 2023-11-20 DIAGNOSIS — D649 Anemia, unspecified: Secondary | ICD-10-CM | POA: Diagnosis not present

## 2023-11-20 DIAGNOSIS — F0284 Dementia in other diseases classified elsewhere, unspecified severity, with anxiety: Secondary | ICD-10-CM | POA: Diagnosis not present

## 2023-11-20 NOTE — Transitions of Care (Post Inpatient/ED Visit) (Signed)
   11/20/2023  Name: Walter Brown MRN: [id number] DOB: [birth year]  Today's TOC FU Call Status: Today's TOC FU Call Status:: Unsuccessful Call (1st Attempt) Unsuccessful Call (1st Attempt) Date: 11/18/23  Attempted to reach the patient regarding the most recent Inpatient/ED visit.  Follow Up Plan: No further outreach attempts will be made at this time. We have been unable to contact the patient. Patient already seen in office Signature Karena Addison, LPN [facility] Nurse Health Advisor Direct Dial [phone]

## 2023-11-20 NOTE — Telephone Encounter (Signed)
Called and spoke with patient to get clarification on what he needs. He said he needs a power wheelchair and wants to know if you could write a letter or prescription so the insurance will cover it

## 2023-11-20 NOTE — Telephone Encounter (Signed)
Copied from CRM [id number]. Topic: General - Other >> Nov 19, 2023  3:55 PM Corin V wrote: Reason for CRM: Patient called and stated he needed Dr, Rogelia Rohrer to write a letter for the "car people stating he needs a Zenaida Niece accessible vehicle". He was unable to provide additional clarification on what the letter needed. He requested the letter be emailed to him at [email or web address]. Agent advised we likely will have to mail it or have them pick it up, but he requested that it be emailed.

## 2023-11-20 NOTE — Telephone Encounter (Signed)
Called and spoke with patient. He would like another round of steroids and antibiotics

## 2023-11-21 DIAGNOSIS — F339 Major depressive disorder, recurrent, unspecified: Secondary | ICD-10-CM | POA: Diagnosis not present

## 2023-11-21 DIAGNOSIS — G20A1 Parkinson's disease without dyskinesia, without mention of fluctuations: Secondary | ICD-10-CM | POA: Diagnosis not present

## 2023-11-21 DIAGNOSIS — F411 Generalized anxiety disorder: Secondary | ICD-10-CM | POA: Diagnosis not present

## 2023-11-21 DIAGNOSIS — F0284 Dementia in other diseases classified elsewhere, unspecified severity, with anxiety: Secondary | ICD-10-CM | POA: Diagnosis not present

## 2023-11-21 DIAGNOSIS — F0283 Dementia in other diseases classified elsewhere, unspecified severity, with mood disturbance: Secondary | ICD-10-CM | POA: Diagnosis not present

## 2023-11-21 DIAGNOSIS — D649 Anemia, unspecified: Secondary | ICD-10-CM | POA: Diagnosis not present

## 2023-11-21 DIAGNOSIS — I2510 Atherosclerotic heart disease of native coronary artery without angina pectoris: Secondary | ICD-10-CM | POA: Diagnosis not present

## 2023-11-21 DIAGNOSIS — I10 Essential (primary) hypertension: Secondary | ICD-10-CM | POA: Diagnosis not present

## 2023-11-21 DIAGNOSIS — I480 Paroxysmal atrial fibrillation: Secondary | ICD-10-CM | POA: Diagnosis not present

## 2023-11-23 ENCOUNTER — Encounter: Payer: Self-pay | Admitting: Family Medicine

## 2023-11-24 NOTE — Telephone Encounter (Signed)
 Edit: changed encounter name

## 2023-11-24 NOTE — Telephone Encounter (Signed)
 Called patient to get some more information. He stated he will find out and call back to let us know

## 2023-11-26 ENCOUNTER — Other Ambulatory Visit: Payer: Self-pay | Admitting: Neurology

## 2023-11-26 DIAGNOSIS — G20A1 Parkinson's disease without dyskinesia, without mention of fluctuations: Secondary | ICD-10-CM

## 2023-11-26 DIAGNOSIS — G20A2 Parkinson's disease without dyskinesia, with fluctuations: Secondary | ICD-10-CM

## 2023-12-01 DIAGNOSIS — F0284 Dementia in other diseases classified elsewhere, unspecified severity, with anxiety: Secondary | ICD-10-CM | POA: Diagnosis not present

## 2023-12-01 DIAGNOSIS — I2510 Atherosclerotic heart disease of native coronary artery without angina pectoris: Secondary | ICD-10-CM | POA: Diagnosis not present

## 2023-12-01 DIAGNOSIS — I480 Paroxysmal atrial fibrillation: Secondary | ICD-10-CM | POA: Diagnosis not present

## 2023-12-01 DIAGNOSIS — F411 Generalized anxiety disorder: Secondary | ICD-10-CM | POA: Diagnosis not present

## 2023-12-01 DIAGNOSIS — G20A1 Parkinson's disease without dyskinesia, without mention of fluctuations: Secondary | ICD-10-CM | POA: Diagnosis not present

## 2023-12-01 DIAGNOSIS — I10 Essential (primary) hypertension: Secondary | ICD-10-CM | POA: Diagnosis not present

## 2023-12-01 DIAGNOSIS — F339 Major depressive disorder, recurrent, unspecified: Secondary | ICD-10-CM | POA: Diagnosis not present

## 2023-12-01 DIAGNOSIS — D649 Anemia, unspecified: Secondary | ICD-10-CM | POA: Diagnosis not present

## 2023-12-01 DIAGNOSIS — F0283 Dementia in other diseases classified elsewhere, unspecified severity, with mood disturbance: Secondary | ICD-10-CM | POA: Diagnosis not present

## 2023-12-02 ENCOUNTER — Encounter: Payer: Self-pay | Admitting: Pharmacist

## 2023-12-02 ENCOUNTER — Ambulatory Visit: Payer: Medicare HMO | Admitting: Internal Medicine

## 2023-12-02 NOTE — Progress Notes (Signed)
 12/02/2023 Name: Walter Brown MRN: [id number] DOB: 22-Feb-1941  No chief complaint on file.   Walter Brown is a 83 y.o. year old male who presented for a telephone visit.   They were referred to the pharmacist by their PCP for assistance in managing medication access.    Subjective:  Received the following request regarding coverage of [employer].  Walter Canary, MD  You; Walter Brown, CMA; Walter Brown D, CMA9 days ago    Does he have any paperwork or a contact at [employer] that we can reach out to? I do not think there is anything we can do about the [facility] but have attached pharmacy to this note to confirm. Thanks   Walter Brown, Walter Brown, CMA  Walter Brown, MD11 days ago    Called patient and he stated he would like to use the company [employer].  He also states that the [facility] is going to be close to $600 for him to get. He is wanting to know if we can do a PA or appeal for this medication?      Care Team: Primary Care Provider: [name], MD ; Next Scheduled Visit: not currently scheduled Neurologist - Dr Tat - next appointment 04/26/2024   Medication Access/Adherence  Current Pharmacy:  [facility] [phone] - [place], [place] - [address] [place]  [address] Phone: [phone] Fax: [phone]   Patient reports affordability concerns with their medications: Yes  - Patient reports that cost of [employer] was $600 per month Patient reports access/transportation concerns to their pharmacy: No  Patient reports adherence concerns with their medications:  No        Objective:  Lab Results  Component Value Date   HGBA1C 5.4 10/30/2023    Lab Results  Component Value Date   CREATININE 0.94 10/30/2023   BUN 18 10/30/2023   NA 139 10/30/2023   K 3.5 10/30/2023   CL 101 10/30/2023   CO2 31 10/30/2023    Lab Results  Component Value Date   CHOL 158 10/30/2023   HDL 63.60 10/30/2023   LDLCALC 78  10/30/2023   TRIG 84.0 10/30/2023   CHOLHDL 2 10/30/2023    Medications Reviewed Today     Reviewed by Walter Brown, Walter Brown (Pharmacist) on 12/02/23 at 1612  Med List Status: <None>   Medication Order Taking? Sig Documenting Provider Last Dose Status Informant  acetaminophen (TYLENOL) 500 MG tablet [id number]  Take 2 tablets (1,000 mg total) by mouth every 6 (six) hours as needed for moderate pain (pain score 4-6). Walter Helper, PA-C  Active   amiodarone (PACERONE) 200 MG tablet [id number]  TAKE 1/2 TABLET BY MOUTH DAILY Walter Gess, MD  Active   bacitracin ointment [id number]  Apply 1 Application topically 2 (two) times daily. Walter Decamp, PA-C  Active   benzonatate (TESSALON PERLES) 100 MG capsule [id number]  Take 1-2 capsules (100-200 mg total) by mouth 2 (two) times daily as needed for cough. Walter Canary, MD  Active   calcium-vitamin D Ruthell Rummage WITH D) 500-5 MG-MCG tablet [id number]  Take 1 tablet by mouth. [provider]  Active   carbidopa-levodopa (SINEMET CR) 50-200 MG tablet [id number]  TAKE 1 TABLET BY MOUTH EVERYDAY AT BEDTIME Tat, Walter S, DO  Active   carbidopa-levodopa (SINEMET IR) 25-100 MG tablet [id number]  2 at 7am, 2 at 11am, for now continue 1 at 3pm, 7pm Tat, Walter Batty,  DO  Active   divalproex (DEPAKOTE) 125 MG DR tablet [id number]  TAKE 1 TABLET (125 MG TOTAL) BY MOUTH AT BEDTIME Walter Eke, PA-C  Active   ezetimibe (ZETIA) 10 MG tablet [id number]  TAKE 1 TABLET BY MOUTH EVERY DAY Walter Sorrow, NP  Active   hydrochlorothiazide (HYDRODIURIL) 25 MG tablet [id number]  TAKE 1 TABLET BY MOUTH EVERY DAY Walter Canary, MD  Active   levalbuterol [facility] HFA) 45 MCG/ACT inhaler [id number]  Inhale 1-2 puffs into the lungs every 6 (six) hours as needed for wheezing. Walter Canary, MD  Active   Melatonin 10 MG TABS [id number]  Take 10 mg by mouth daily as needed. [provider]  Active   mirabegron ER (MYRBETRIQ) 50 MG TB24 tablet [id number]   Take 50 mg by mouth daily. [provider]  Active   Multiple Vitamins-Minerals (ICAPS AREDS 2 PO) [id number]  Take 1 capsule by mouth in the morning and at bedtime. [provider]  Active   pantoprazole (PROTONIX) 40 MG tablet [id number]  Take 1 tablet (40 mg total) by mouth daily as needed. Walter Craze, NP  Active   Semaglutide-Weight Management [facility] 0.25 MG/0.5ML Ivory Broad [id number] No Inject 0.25 mg into the skin once a week.  Patient not taking: Reported on 12/02/2023   Walter Canary, MD Not Taking Active   sertraline (ZOLOFT) 100 MG tablet [id number]  TAKE 1 TABLET BY MOUTH EVERY DAY Walter Canary, MD  Active   tamsulosin (FLOMAX) 0.4 MG CAPS capsule [id number]  Take 1 capsule (0.4 mg total) by mouth daily. Walter Craze, NP  Active               Assessment/Plan:   Medication Access  - prior authorization had already been approved fro [facility] by [facility] from 10/08/2023 thru 10/06/2024, however it is still non preferrd status tier 4 which cost is 40% of medications cost which is why patient'Brown copay is still $600.  - I called [employer] to see if we could start a tier exception because pt has ASCVD and past TIA but per representative, there is no tier expection for [facility]. - patient notified of above by phone message and my chart.     Walter Brown, PharmD Clinical Pharmacist [facility]  Population Health [phone]

## 2023-12-03 DIAGNOSIS — F411 Generalized anxiety disorder: Secondary | ICD-10-CM | POA: Diagnosis not present

## 2023-12-03 DIAGNOSIS — F0283 Dementia in other diseases classified elsewhere, unspecified severity, with mood disturbance: Secondary | ICD-10-CM | POA: Diagnosis not present

## 2023-12-03 DIAGNOSIS — D649 Anemia, unspecified: Secondary | ICD-10-CM | POA: Diagnosis not present

## 2023-12-03 DIAGNOSIS — F339 Major depressive disorder, recurrent, unspecified: Secondary | ICD-10-CM | POA: Diagnosis not present

## 2023-12-03 DIAGNOSIS — G20A1 Parkinson's disease without dyskinesia, without mention of fluctuations: Secondary | ICD-10-CM | POA: Diagnosis not present

## 2023-12-03 DIAGNOSIS — I2510 Atherosclerotic heart disease of native coronary artery without angina pectoris: Secondary | ICD-10-CM | POA: Diagnosis not present

## 2023-12-03 DIAGNOSIS — I10 Essential (primary) hypertension: Secondary | ICD-10-CM | POA: Diagnosis not present

## 2023-12-03 DIAGNOSIS — I480 Paroxysmal atrial fibrillation: Secondary | ICD-10-CM | POA: Diagnosis not present

## 2023-12-03 DIAGNOSIS — F0284 Dementia in other diseases classified elsewhere, unspecified severity, with anxiety: Secondary | ICD-10-CM | POA: Diagnosis not present

## 2023-12-05 ENCOUNTER — Encounter: Payer: Self-pay | Admitting: Internal Medicine

## 2023-12-05 ENCOUNTER — Ambulatory Visit (INDEPENDENT_AMBULATORY_CARE_PROVIDER_SITE_OTHER): Payer: Medicare HMO | Admitting: Internal Medicine

## 2023-12-05 VITALS — BP 132/84 | HR 64 | Ht 65.0 in | Wt 164.8 lb

## 2023-12-05 DIAGNOSIS — F411 Generalized anxiety disorder: Secondary | ICD-10-CM | POA: Diagnosis not present

## 2023-12-05 DIAGNOSIS — N401 Enlarged prostate with lower urinary tract symptoms: Secondary | ICD-10-CM | POA: Diagnosis not present

## 2023-12-05 DIAGNOSIS — F0284 Dementia in other diseases classified elsewhere, unspecified severity, with anxiety: Secondary | ICD-10-CM | POA: Diagnosis not present

## 2023-12-05 DIAGNOSIS — G20A1 Parkinson's disease without dyskinesia, without mention of fluctuations: Secondary | ICD-10-CM | POA: Diagnosis not present

## 2023-12-05 DIAGNOSIS — I2510 Atherosclerotic heart disease of native coronary artery without angina pectoris: Secondary | ICD-10-CM | POA: Diagnosis not present

## 2023-12-05 DIAGNOSIS — F339 Major depressive disorder, recurrent, unspecified: Secondary | ICD-10-CM | POA: Diagnosis not present

## 2023-12-05 DIAGNOSIS — R052 Subacute cough: Secondary | ICD-10-CM | POA: Diagnosis not present

## 2023-12-05 DIAGNOSIS — F0283 Dementia in other diseases classified elsewhere, unspecified severity, with mood disturbance: Secondary | ICD-10-CM | POA: Diagnosis not present

## 2023-12-05 DIAGNOSIS — I10 Essential (primary) hypertension: Secondary | ICD-10-CM | POA: Diagnosis not present

## 2023-12-05 DIAGNOSIS — I480 Paroxysmal atrial fibrillation: Secondary | ICD-10-CM | POA: Diagnosis not present

## 2023-12-05 DIAGNOSIS — D649 Anemia, unspecified: Secondary | ICD-10-CM | POA: Diagnosis not present

## 2023-12-05 DIAGNOSIS — R350 Frequency of micturition: Secondary | ICD-10-CM | POA: Diagnosis not present

## 2023-12-05 MED ORDER — GUAIFENESIN 100 MG/5ML PO SYRP: 200.0000 mg | ORAL_SOLUTION | Freq: Three times a day (TID) | ORAL | Status: AC | PRN

## 2023-12-05 MED ORDER — BUDESONIDE-FORMOTEROL FUMARATE 160-4.5 MCG/ACT IN AERO: 2.0000 | INHALATION_SPRAY | Freq: Two times a day (BID) | RESPIRATORY_TRACT | 3 refills | Status: DC

## 2023-12-05 MED ORDER — PREDNISONE 10 MG PO TABS: ORAL_TABLET | ORAL | 0 refills | Status: DC

## 2023-12-05 NOTE — Patient Instructions (Addendum)
 For cough and chest congestion.  Take prednisone for few days We are adding Symbicort: 2 puffs twice a day every day Continue Xopenex, take it as needed if the cough and congestion continue. Okay to take over-the-counter Robitussin as needed for cough  Please call in 10 days if you are not getting better, you might need antibiotics.   Please schedule a follow-up with Dr. Abner Greenspan or one  of [place] in 2 months.   Please call your urologist regarding questions of your prostate medicines.

## 2023-12-05 NOTE — Progress Notes (Signed)
 Subjective:    Patient ID: Walter Brown, male    DOB: Aug 01, 1941, 83 y.o.   MRN: [id number]  DOS:  12/05/2023 Type of visit - description: Acute visit, here with his wife and his son.  The patient was treated with antibiotics 10/30/2023. Was seen here by PCP 11/10/2023, respiratory symptoms were somewhat better. Was prescribed Tessalon Perles and inhaler. Chest x-ray was negative.  He is here today because the cough is slightly improved but not gone. Still coughing up sputum, off-white and occasionally greenish. No fever or chills No chest pain Not feeling particularly short of breath.  Review of Systems See above   Past Medical History:  Diagnosis Date   A-fib [facility])    Allergy    Anemia    mild   Arthritis 04/06/2017   Asthma    childhood   ASVD (arteriosclerotic vascular disease) 09/07/2018   Back pain with radiation 04/26/2013   Low back with LLE radiculopathy   Basal cell carcinoma    skin- on nose- basal cell (20 yrs ago) forehead 1 year ago     Sees Dr Elmon Else of dermatology   BPH (benign prostatic hyperplasia)    Bradycardia 06/28/2018   Cataract 10/07/2014   Cerumen impaction 08/10/2012   Chicken pox as child   Coronary artery calcification 11/23/2019   Dysphagia 09/11/2021   Elevated LFTs    Essential hypertension    ETD (Eustachian tube dysfunction), right 01/17/2022   Generalized anxiety disorder    [place] measles as a child   Hemoptysis 01/17/2022   Hyperglycemia 04/25/2020   Hyperlipidemia    Insomnia 02/06/2021   Internal hemorrhoids    Irregular cardiac rhythm 03/10/2018   Lipoma 08/04/2018   Macular degeneration    Major depressive disorder 08/01/2012   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   Nocturia 12/04/2022   Obstructive sleep apnea 11/09/2018   Patient reports mild symptoms; was not prescribed a CPAP machine   Otitis externa 08/10/2012   PAF (paroxysmal atrial fibrillation)    Palpitations  09/07/2018   Parkinson's disease (HCC) 05/19/2019   Recurrent falls 12/04/2022   Thrombocytopenia    TIA (transient ischemic attack) 03/10/2018   Unsteady gait 09/11/2021    Past Surgical History:  Procedure Laterality Date   BELPHAROPTOSIS REPAIR     very young, b/l   EXCISIONAL HEMORRHOIDECTOMY     EYE SURGERY  2017   eyelids   HYDROCELE EXCISION / REPAIR  2012   b/l   SKIN CANCER EXCISION     nose and forehead, basal cell CA   UPPER GASTROINTESTINAL ENDOSCOPY      Current Outpatient Medications  Medication Instructions   acetaminophen (TYLENOL) 1,000 mg, Oral, Every 6 hours PRN   amiodarone (PACERONE) 100 mg, Oral, Daily   bacitracin ointment 1 Application, Topical, 2 times daily   benzonatate (TESSALON PERLES) 100-200 mg, Oral, 2 times daily PRN   budesonide-formoterol (SYMBICORT) 160-4.5 MCG/ACT inhaler 2 puffs, Inhalation, 2 times daily   calcium-vitamin D (OSCAL WITH D) 500-5 MG-MCG tablet 1 tablet   carbidopa-levodopa (SINEMET CR) 50-200 MG tablet TAKE 1 TABLET BY MOUTH EVERYDAY AT BEDTIME   carbidopa-levodopa (SINEMET IR) 25-100 MG tablet 2 at 7am, 2 at 11am, for now continue 1 at 3pm, 7pm   divalproex (DEPAKOTE) 125 MG DR tablet TAKE 1 TABLET (125 MG TOTAL) BY MOUTH AT BEDTIME   ezetimibe (ZETIA) 10 mg, Oral, Daily   guaifenesin (ROBITUSSIN) 200 mg, Oral, 3 times daily PRN  hydrochlorothiazide (HYDRODIURIL) 25 mg, Oral, Daily   levalbuterol (XOPENEX HFA) 45 MCG/ACT inhaler 1-2 puffs, Inhalation, Every 6 hours PRN   Melatonin 10 mg, Daily PRN   mirabegron ER (MYRBETRIQ) 50 mg, Daily   Multiple Vitamins-Minerals (ICAPS AREDS 2 PO) 1 capsule, 2 times daily   pantoprazole (PROTONIX) 40 mg, Oral, Daily PRN   predniSONE (DELTASONE) 10 MG tablet 3 tabs x 3 days, 2 tabs x 3 days, 1 tab x 3 days   sertraline (ZOLOFT) 100 mg, Oral, Daily   solifenacin (VESICARE) 5 mg, Daily   tamsulosin (FLOMAX) 0.4 mg, Oral, Daily   Wegovy 0.25 mg, Subcutaneous, Weekly        Objective:   Physical Exam BP 132/84 (BP Location: Left Arm, Patient Position: Sitting, Cuff Size: Normal)   Pulse 64   Ht 5\' 5"  (1.651 m)   Wt 164 lb 12.8 oz (74.8 kg)   SpO2 95%   BMI 27.42 kg/m  General:   Well developed, NAD, BMI noted. HEENT:  Normocephalic . Face symmetric, atraumatic Lungs:  Few rhonchi with cough, very mild large airway congestion with cough, increased expiratory time. Normal respiratory effort, no intercostal retractions, no accessory muscle use. Heart: RRR,  no murmur.  Lower extremities: no pretibial edema bilaterally  Skin: Not pale. Not jaundice Neurologic:  alert & oriented X3.  Speech normal, gait not tested.    psych--  Cognition and judgment appear intact.  Cooperative with normal attention span and concentration.  Behavior appropriate. No anxious or depressed appearing.      Assessment   83 year old gentleman, PMH includes BPH, A-fib, TIA, Parkinson, depression, on multiple meds including Pacerone, Sinemet, Depakote, presents with  Cough: Having respiratory symptoms for a while, took antibiotics 10/30/2023, started Xoponex and had a neg CXR on  He is improving not completely better. He reports history of asthma as a child, he is a former smoker. Suspect cough is at least in part due to bronchospasm. Plan: Round of prednisone. Add Symbicort, Xopenex as needed Robitussin Call in 10 days, if not better consider round of antibiotics. Call anytime if symptoms severe BPH: Symptoms not well-controlled, still have nocturia, last seen by urology 11/06/2023, recommend to see them for further advice. RTC to this office 2 months, sooner if needed

## 2023-12-08 DIAGNOSIS — D044 Carcinoma in situ of skin of scalp and neck: Secondary | ICD-10-CM | POA: Diagnosis not present

## 2023-12-08 DIAGNOSIS — L821 Other seborrheic keratosis: Secondary | ICD-10-CM | POA: Diagnosis not present

## 2023-12-08 DIAGNOSIS — Z85828 Personal history of other malignant neoplasm of skin: Secondary | ICD-10-CM | POA: Diagnosis not present

## 2023-12-08 DIAGNOSIS — Z86018 Personal history of other benign neoplasm: Secondary | ICD-10-CM | POA: Diagnosis not present

## 2023-12-08 DIAGNOSIS — L570 Actinic keratosis: Secondary | ICD-10-CM | POA: Diagnosis not present

## 2023-12-08 DIAGNOSIS — L578 Other skin changes due to chronic exposure to nonionizing radiation: Secondary | ICD-10-CM | POA: Diagnosis not present

## 2023-12-08 DIAGNOSIS — D225 Melanocytic nevi of trunk: Secondary | ICD-10-CM | POA: Diagnosis not present

## 2023-12-08 DIAGNOSIS — L814 Other melanin hyperpigmentation: Secondary | ICD-10-CM | POA: Diagnosis not present

## 2023-12-08 DIAGNOSIS — L219 Seborrheic dermatitis, unspecified: Secondary | ICD-10-CM | POA: Diagnosis not present

## 2023-12-09 ENCOUNTER — Telehealth: Payer: Self-pay

## 2023-12-09 NOTE — Telephone Encounter (Unsigned)
 Copied from CRM [id number]. Topic: General - Other >> Dec 09, 2023  4:13 PM Prudencio Pair wrote: Reason for CRM: Patient's son, Demetrious Rainford, called in stating that his dad has Parkinson's and he has an accessible Zenaida Niece now. [name] stated that his dad has a power chair & it needs a bracket installed in it. He wants to know if Dr. Abner Greenspan can write a letter stating that it's medically necessary & the company will waive the tax. Please give him a call back to advise whether this can be completed. CB #: [id number].

## 2023-12-11 NOTE — Telephone Encounter (Signed)
 Pt called and lvm to return call

## 2023-12-16 ENCOUNTER — Other Ambulatory Visit: Payer: Self-pay | Admitting: Neurology

## 2023-12-16 ENCOUNTER — Encounter: Payer: Self-pay | Admitting: Emergency Medicine

## 2023-12-16 ENCOUNTER — Other Ambulatory Visit: Payer: Self-pay | Admitting: Family Medicine

## 2023-12-16 DIAGNOSIS — G20A2 Parkinson's disease without dyskinesia, with fluctuations: Secondary | ICD-10-CM

## 2023-12-17 ENCOUNTER — Telehealth: Payer: Self-pay | Admitting: Emergency Medicine

## 2023-12-17 DIAGNOSIS — G20A1 Parkinson's disease without dyskinesia, without mention of fluctuations: Secondary | ICD-10-CM | POA: Diagnosis not present

## 2023-12-17 DIAGNOSIS — I2510 Atherosclerotic heart disease of native coronary artery without angina pectoris: Secondary | ICD-10-CM | POA: Diagnosis not present

## 2023-12-17 DIAGNOSIS — F411 Generalized anxiety disorder: Secondary | ICD-10-CM | POA: Diagnosis not present

## 2023-12-17 DIAGNOSIS — F0284 Dementia in other diseases classified elsewhere, unspecified severity, with anxiety: Secondary | ICD-10-CM | POA: Diagnosis not present

## 2023-12-17 DIAGNOSIS — D649 Anemia, unspecified: Secondary | ICD-10-CM | POA: Diagnosis not present

## 2023-12-17 DIAGNOSIS — I10 Essential (primary) hypertension: Secondary | ICD-10-CM | POA: Diagnosis not present

## 2023-12-17 DIAGNOSIS — F339 Major depressive disorder, recurrent, unspecified: Secondary | ICD-10-CM | POA: Diagnosis not present

## 2023-12-17 DIAGNOSIS — F0283 Dementia in other diseases classified elsewhere, unspecified severity, with mood disturbance: Secondary | ICD-10-CM | POA: Diagnosis not present

## 2023-12-17 DIAGNOSIS — I480 Paroxysmal atrial fibrillation: Secondary | ICD-10-CM | POA: Diagnosis not present

## 2023-12-17 NOTE — Telephone Encounter (Signed)
 Copied from CRM [id number]. Topic: Clinical - Medical Advice >> Dec 17, 2023 12:12 PM Elizebeth Brooking wrote: Reason for CRM: Britta Mccreedy Speech Therapist [facility] called in wanting to report  Falls had no injury no bruises vitals are normal - Patient wife is requesting Dr.Blyth gives paperwork that states he requires a wheelchair accessible Zenaida Niece for the passenger side  6th  9th 11th    [phone]

## 2023-12-17 NOTE — Telephone Encounter (Signed)
 Called to follow up with [facility]. No answer. LVM

## 2023-12-18 DIAGNOSIS — N319 Neuromuscular dysfunction of bladder, unspecified: Secondary | ICD-10-CM | POA: Diagnosis not present

## 2023-12-18 DIAGNOSIS — N401 Enlarged prostate with lower urinary tract symptoms: Secondary | ICD-10-CM | POA: Diagnosis not present

## 2023-12-18 DIAGNOSIS — R3912 Poor urinary stream: Secondary | ICD-10-CM | POA: Diagnosis not present

## 2023-12-19 DIAGNOSIS — F0284 Dementia in other diseases classified elsewhere, unspecified severity, with anxiety: Secondary | ICD-10-CM | POA: Diagnosis not present

## 2023-12-19 DIAGNOSIS — G20A1 Parkinson's disease without dyskinesia, without mention of fluctuations: Secondary | ICD-10-CM | POA: Diagnosis not present

## 2023-12-19 DIAGNOSIS — F411 Generalized anxiety disorder: Secondary | ICD-10-CM | POA: Diagnosis not present

## 2023-12-19 DIAGNOSIS — D649 Anemia, unspecified: Secondary | ICD-10-CM | POA: Diagnosis not present

## 2023-12-19 DIAGNOSIS — I480 Paroxysmal atrial fibrillation: Secondary | ICD-10-CM | POA: Diagnosis not present

## 2023-12-19 DIAGNOSIS — I10 Essential (primary) hypertension: Secondary | ICD-10-CM | POA: Diagnosis not present

## 2023-12-19 DIAGNOSIS — I2510 Atherosclerotic heart disease of native coronary artery without angina pectoris: Secondary | ICD-10-CM | POA: Diagnosis not present

## 2023-12-19 DIAGNOSIS — F339 Major depressive disorder, recurrent, unspecified: Secondary | ICD-10-CM | POA: Diagnosis not present

## 2023-12-19 DIAGNOSIS — F0283 Dementia in other diseases classified elsewhere, unspecified severity, with mood disturbance: Secondary | ICD-10-CM | POA: Diagnosis not present

## 2023-12-23 ENCOUNTER — Emergency Department (HOSPITAL_BASED_OUTPATIENT_CLINIC_OR_DEPARTMENT_OTHER)
Admission: EM | Admit: 2023-12-23 | Discharge: 2023-12-23 | Disposition: A | Discharge status: Home / Self Care | Attending: Emergency Medicine | Admitting: Emergency Medicine

## 2023-12-23 ENCOUNTER — Telehealth: Payer: Self-pay | Admitting: Emergency Medicine

## 2023-12-23 ENCOUNTER — Emergency Department (HOSPITAL_BASED_OUTPATIENT_CLINIC_OR_DEPARTMENT_OTHER)

## 2023-12-23 ENCOUNTER — Other Ambulatory Visit: Payer: Self-pay

## 2023-12-23 ENCOUNTER — Encounter (HOSPITAL_BASED_OUTPATIENT_CLINIC_OR_DEPARTMENT_OTHER): Payer: Self-pay

## 2023-12-23 DIAGNOSIS — I10 Essential (primary) hypertension: Secondary | ICD-10-CM | POA: Diagnosis not present

## 2023-12-23 DIAGNOSIS — G20A1 Parkinson's disease without dyskinesia, without mention of fluctuations: Secondary | ICD-10-CM | POA: Diagnosis not present

## 2023-12-23 DIAGNOSIS — R42 Dizziness and giddiness: Secondary | ICD-10-CM | POA: Diagnosis not present

## 2023-12-23 DIAGNOSIS — I2510 Atherosclerotic heart disease of native coronary artery without angina pectoris: Secondary | ICD-10-CM | POA: Diagnosis not present

## 2023-12-23 DIAGNOSIS — G919 Hydrocephalus, unspecified: Secondary | ICD-10-CM | POA: Diagnosis not present

## 2023-12-23 DIAGNOSIS — R404 Transient alteration of awareness: Secondary | ICD-10-CM | POA: Diagnosis not present

## 2023-12-23 DIAGNOSIS — I480 Paroxysmal atrial fibrillation: Secondary | ICD-10-CM | POA: Diagnosis not present

## 2023-12-23 DIAGNOSIS — F0283 Dementia in other diseases classified elsewhere, unspecified severity, with mood disturbance: Secondary | ICD-10-CM | POA: Diagnosis not present

## 2023-12-23 DIAGNOSIS — G912 (Idiopathic) normal pressure hydrocephalus: Secondary | ICD-10-CM | POA: Insufficient documentation

## 2023-12-23 DIAGNOSIS — F339 Major depressive disorder, recurrent, unspecified: Secondary | ICD-10-CM | POA: Diagnosis not present

## 2023-12-23 DIAGNOSIS — F0284 Dementia in other diseases classified elsewhere, unspecified severity, with anxiety: Secondary | ICD-10-CM | POA: Diagnosis not present

## 2023-12-23 DIAGNOSIS — R4182 Altered mental status, unspecified: Secondary | ICD-10-CM | POA: Diagnosis not present

## 2023-12-23 DIAGNOSIS — D649 Anemia, unspecified: Secondary | ICD-10-CM | POA: Diagnosis not present

## 2023-12-23 DIAGNOSIS — F411 Generalized anxiety disorder: Secondary | ICD-10-CM | POA: Diagnosis not present

## 2023-12-23 LAB — URINALYSIS, ROUTINE W REFLEX MICROSCOPIC
Bilirubin Urine: NEGATIVE
Glucose, UA: NEGATIVE mg/dL
Hgb urine dipstick: NEGATIVE
Ketones, ur: NEGATIVE mg/dL
Leukocytes,Ua: NEGATIVE
Nitrite: NEGATIVE
Protein, ur: NEGATIVE mg/dL
Specific Gravity, Urine: >=1.03 (ref 1.005–1.030)
pH: 6 (ref 5.0–8.0)

## 2023-12-23 LAB — BASIC METABOLIC PANEL
Anion gap: 8 (ref 5–15)
BUN: 20 mg/dL (ref 8–23)
CO2: 28 mmol/L (ref 22–32)
Calcium: 8.7 mg/dL — ABNORMAL LOW (ref 8.9–10.3)
Chloride: 102 mmol/L (ref 98–111)
Creatinine, Ser: 0.99 mg/dL (ref 0.61–1.24)
GFR, Estimated: >60 mL/min (ref >60)
Glucose, Bld: 94 mg/dL (ref 70–99)
Potassium: 3.4 mmol/L — ABNORMAL LOW (ref 3.5–5.1)
Sodium: 138 mmol/L (ref 135–145)

## 2023-12-23 LAB — CBC
HCT: 40 % (ref 39.0–52.0)
Hemoglobin: 13.7 g/dL (ref 13.0–17.0)
MCH: 32.2 pg (ref 26.0–34.0)
MCHC: 34.3 g/dL (ref 30.0–36.0)
MCV: 94.1 fL (ref 80.0–100.0)
Platelets: 153 10*3/uL (ref 150–400)
RBC: 4.25 MIL/uL (ref 4.22–5.81)
RDW: 13.3 % (ref 11.5–15.5)
WBC: 7.9 10*3/uL (ref 4.0–10.5)
nRBC: 0 % (ref 0.0–0.2)

## 2023-12-23 LAB — RESP PANEL BY RT-PCR (RSV, FLU A&B, COVID)  RVPGX2
Influenza A by PCR: NEGATIVE
Influenza B by PCR: NEGATIVE
Resp Syncytial Virus by PCR: NEGATIVE
SARS Coronavirus 2 by RT PCR: NEGATIVE

## 2023-12-23 LAB — CBG MONITORING, ED: Glucose-Capillary: 91 mg/dL (ref 70–99)

## 2023-12-23 MED ORDER — ONDANSETRON 4 MG PO TBDP
4.0000 mg | ORAL_TABLET | Freq: Once | ORAL | Status: DC
Start: 2023-12-23 — End: 2023-12-24
  Filled 2023-12-23: qty 1

## 2023-12-23 MED ORDER — LACTATED RINGERS IV BOLUS
1000.0000 mL | Freq: Once | INTRAVENOUS | Status: DC
Start: 2023-12-23 — End: 2023-12-23

## 2023-12-23 NOTE — Telephone Encounter (Signed)
 Copied from CRM [id number]. Topic: Clinical - Medical Advice >> Dec 23, 2023  3:22 PM Alcus Dad wrote: Reason for CRM: Melissa from [facility] called and stated that she is sending patient to the hospital. Patient has had a TIA or mini stroke. Patient is declining and can barely stand. [phone]

## 2023-12-23 NOTE — Telephone Encounter (Signed)
 Called home health facility to contact [facility] for an update, but got the voicemail.

## 2023-12-23 NOTE — ED Provider Notes (Signed)
 [place] [facility] Provider Note   CSN: [id number] Arrival date & time: 12/23/23  1639     History Chief Complaint  Patient presents with   Dizziness   Altered Mental Status    HPI Walter Brown is a 83 y.o. male presenting for falls x2 a few times over the past few weeks. HHA felt that he was more confused than his baseline and generally weaker as well as more confused than even earlier this week.  He appears to have improved throughout the day today per family who is at bedside today.  States he tends to have bad days especially in the morning but felt that today was indeed disproportionately worse than his baseline.   Patient's recorded medical, surgical, social, medication list and allergies were reviewed in the Snapshot window as part of the initial history.   Review of Systems   Review of Systems  Constitutional:  Positive for fatigue. Negative for chills and fever.  HENT:  Negative for ear pain and sore throat.   Eyes:  Negative for pain and visual disturbance.  Respiratory:  Negative for cough and shortness of breath.   Cardiovascular:  Negative for chest pain and palpitations.  Gastrointestinal:  Negative for abdominal pain and vomiting.  Genitourinary:  Negative for dysuria and hematuria.  Musculoskeletal:  Negative for arthralgias and back pain.  Skin:  Negative for color change and rash.  Neurological:  Positive for weakness. Negative for seizures and syncope.  Psychiatric/Behavioral:  Positive for confusion.   All other systems reviewed and are negative.   Physical Exam Updated Vital Signs BP (!) 153/96   Pulse 65   Temp 97.7 F (36.5 C)   Resp 18   Ht 5\' 5"  (1.651 m)   Wt 81.6 kg   SpO2 97%   BMI 29.95 kg/m  Physical Exam Vitals and nursing note reviewed.  Constitutional:      General: He is not in acute distress.    Appearance: He is well-developed.  HENT:     Head: Normocephalic and atraumatic.  Eyes:      Conjunctiva/sclera: Conjunctivae normal.  Cardiovascular:     Rate and Rhythm: Normal rate and regular rhythm.     Heart sounds: No murmur heard. Pulmonary:     Effort: Pulmonary effort is normal. No respiratory distress.     Breath sounds: Normal breath sounds.  Abdominal:     Palpations: Abdomen is soft.     Tenderness: There is no abdominal tenderness.  Musculoskeletal:        General: No swelling.     Cervical back: Neck supple.  Skin:    General: Skin is warm and dry.     Capillary Refill: Capillary refill takes less than 2 seconds.  Neurological:     Mental Status: He is alert.  Psychiatric:        Mood and Affect: Mood normal.      ED Course/ Medical Decision Making/ A&P    Procedures Procedures   Medications Ordered in ED Medications  ondansetron (ZOFRAN-ODT) disintegrating tablet 4 mg (4 mg Oral Not Given 12/23/23 2042)   Medical Decision Making:   Walter Brown is a 83 y.o. male who presented to the ED today with altered mental status detailed above.    Additional history discussed with patient's family/caregivers.  Patient placed on continuous vitals and telemetry monitoring while in ED which was reviewed periodically.  Complete initial physical exam performed, notably the patient  was HDS in NAD.    Reviewed and confirmed nursing documentation for past medical history, family history, social history.    Initial Assessment:   With the patient's presentation of altered mental status, most likely diagnosis is delerium 2/2 infectious etiology (UTI/CAP/URI) vs metabolic abnormality (Na/K/Mg/Ca) vs nonspecific etiology. Other diagnoses were considered including (but not limited to) CVA, ICH, intracranial mass, critical dehydration, heptatic dysfunction, uremia, hypercarbia, intoxication, endrocrine abnormality, toxidrome. These are considered less likely due to history of present illness and physical exam findings.   This is most consistent with an acute life/limb  threatening illness complicated by underlying chronic conditions.  Initial Plan:  CTH to evaluate for intracranial etiology of patient's symptoms  Screening labs including CBC and Metabolic panel to evaluate for infectious or metabolic etiology of disease.  Urinalysis with reflex culture ordered to evaluate for UTI or relevant urologic/nephrologic pathology.  CXR to evaluate for structural/infectious intrathoracic pathology.  EKG to evaluate for cardiac pathology Objective evaluation as below reviewed   Initial Study Results:   Laboratory  All laboratory results reviewed without evidence of clinically relevant pathology.    EKG EKG was reviewed independently. Rate, rhythm, axis, intervals all examined and without medically relevant abnormality. ST segments without concerns for elevations.    Radiology:  All images reviewed independently. Agree with radiology report at this time.   DG Chest Portable 1 View Result Date: 12/23/2023 CLINICAL DATA:  Altered mental status, dizziness. EXAM: PORTABLE CHEST 1 VIEW COMPARISON:  November 10, 2023. FINDINGS: The heart size and mediastinal contours are within normal limits. Both lungs are clear. The visualized skeletal structures are unremarkable. IMPRESSION: No active disease. Electronically Signed   By: Lupita Raider M.D.   On: 12/23/2023 20:10   CT Head Wo Contrast Result Date: 12/23/2023 CLINICAL DATA:  Altered level of consciousness, dizziness for 2 days, history of Parkinson's disease EXAM: CT HEAD WITHOUT CONTRAST TECHNIQUE: Contiguous axial images were obtained from the base of the skull through the vertex without intravenous contrast. RADIATION DOSE REDUCTION: This exam was performed according to the departmental dose-optimization program which includes automated exposure control, adjustment of the mA and/or kV according to patient size and/or use of iterative reconstruction technique. COMPARISON:  11/04/2023 FINDINGS: Brain: No evidence of acute  infarct or hemorrhage. Stable prominence of the lateral ventricles out of proportion to the degree of cerebral atrophy, which may reflect underlying normal pressure hydrocephalus. Remaining midline structures are unremarkable. No acute extra-axial fluid collections. No mass effect. Vascular: No hyperdense vessel or unexpected calcification. Skull: Normal. Negative for fracture or focal lesion. Sinuses/Orbits: No acute finding. Other: None. IMPRESSION: 1. No acute intracranial process. 2. Stable prominence of the lateral ventricles out of proportion to the degree of cerebral atrophy, which may reflect underlying normal pressure hydrocephalus. Electronically Signed   By: Sharlet Salina M.D.   On: 12/23/2023 20:10  Final Assessment and Plan:   Patient's history of present illness physicals and findings are most consistent with likely developing normal pressure hydrocephalus.   They feel comfortable with following up with his established neurologist. Message sent via in basket to primary neurologist to help expedite care though family was reinforced to call the clinic in the morning for most expeditious management. Patient does not require any further workup at this time given stability of syndrome.  Patient ambulatory tolerating p.o. intake at time of discharge. Disposition:  I have considered need for hospitalization, however, considering all of the above, I believe this patient is stable for discharge  at this time.  Patient/family educated about specific return precautions for given chief complaint and symptoms.  Patient/family educated about follow-up with PCP/Neurology.     Patient/family expressed understanding of return precautions and need for follow-up. Patient spoken to regarding all imaging and laboratory results and appropriate follow up for these results. All education provided in verbal form with additional information in written form. Time was allowed for answering of patient questions. Patient  discharged.    Emergency Department Medication Summary:   Medications  ondansetron (ZOFRAN-ODT) disintegrating tablet 4 mg (4 mg Oral Not Given 12/23/23 2042)          Clinical Impression:  1. Normal pressure hydrocephalus [facility])      Discharge   Final Clinical Impression(s) / ED Diagnoses Final diagnoses:  Normal pressure hydrocephalus [facility])    Rx / DC Orders ED Discharge Orders     None         Glyn Ade, MD 12/23/23 2118

## 2023-12-23 NOTE — Discharge Instructions (Addendum)
 You were seen today for some confusion, weakness and difficulty moving. You likely have a disease called normal pressure hydrocephalus that is causing the symptoms. This is a disease managed by your neurologist and they may need to schedule you for a lumbar puncture and MRI.  You should call your primary neurologist's office tomorrow for further recommendations. The disease will be progressive in the outpatient setting and will likely slowly get worse it is important that you pursue this expeditiously.

## 2023-12-23 NOTE — Telephone Encounter (Signed)
 FYI

## 2023-12-23 NOTE — ED Triage Notes (Addendum)
 Patient arrives with complaints of increased altered mental status, dizziness x2 days. Patients family reports that the patient also has parkinson's disease and he has fallen twice over the past two weeks, hitting his head. He does not take blood thinners.

## 2023-12-25 ENCOUNTER — Telehealth: Payer: Self-pay | Admitting: Neurology

## 2023-12-25 NOTE — Telephone Encounter (Signed)
 Pt's son called in and left a message. Pt was taken to ED and was diagnosed with Hydrocephalus and they recommended the fluid be drained. They are wanting to get that done.

## 2023-12-26 NOTE — Telephone Encounter (Signed)
 Can not speak to son as he is not on the DPR  Called apteitns wife to assure her Dr. Arbutus Leas looked at the ED notes and patient is stable . He needs to make an appointment

## 2023-12-30 DIAGNOSIS — F0284 Dementia in other diseases classified elsewhere, unspecified severity, with anxiety: Secondary | ICD-10-CM | POA: Diagnosis not present

## 2023-12-30 DIAGNOSIS — F411 Generalized anxiety disorder: Secondary | ICD-10-CM | POA: Diagnosis not present

## 2023-12-30 DIAGNOSIS — D649 Anemia, unspecified: Secondary | ICD-10-CM | POA: Diagnosis not present

## 2023-12-30 DIAGNOSIS — I480 Paroxysmal atrial fibrillation: Secondary | ICD-10-CM | POA: Diagnosis not present

## 2023-12-30 DIAGNOSIS — G20A1 Parkinson's disease without dyskinesia, without mention of fluctuations: Secondary | ICD-10-CM | POA: Diagnosis not present

## 2023-12-30 DIAGNOSIS — I10 Essential (primary) hypertension: Secondary | ICD-10-CM | POA: Diagnosis not present

## 2023-12-30 DIAGNOSIS — F339 Major depressive disorder, recurrent, unspecified: Secondary | ICD-10-CM | POA: Diagnosis not present

## 2023-12-30 DIAGNOSIS — I2510 Atherosclerotic heart disease of native coronary artery without angina pectoris: Secondary | ICD-10-CM | POA: Diagnosis not present

## 2023-12-30 DIAGNOSIS — F0283 Dementia in other diseases classified elsewhere, unspecified severity, with mood disturbance: Secondary | ICD-10-CM | POA: Diagnosis not present

## 2023-12-30 NOTE — Progress Notes (Unsigned)
 Assessment/Plan:   1.  Parkinsons Disease  -DaT scan abnormal in the past  -continue  carbidopa/levodopa, 2 at 7am, 2 at 11am, 1 at 3pm, 7pm  -continue carbidopa/levodopa 50/200 at bed   -Patient had 2nd opinion at [facility] in 02/2023 and dx was agreed upon  -only walk with someone directly walking with him with gait belt and walker.  Otherwise, no walking  -he has a scooter and an electric wheelchair but I want him to get rid of the scooter.  Discussed this in detail today and at last visit.  He has had several falls from the scooter in which he hit his head.  -they plan to access his LTC insurance and I support that wholey  2.  Memory change  -Patient with evidence of MCI on neurocog testing in 12/2022.  Suspect PDD now.  He is scheduled for repeat testing in June.  -Patient used to like to read and he is not doing it any longer.  We talked about trying to write his life story with the help of his caregivers.  -  He is currently on Depakote, 125 mg bid.    3.  Depression  -Primary care prescribing sertraline, 100 mg daily.    4.  Urinary incontinenece  -follows with [facility].  5.  Incidental infarct, 04/2023  -This was completely asymptomatic and noted on MRI of the brain that was done by our PA for many change  -MRA head and neck was unremarkable.  -Patient's last lipids were in February, 2024 and his LDL really was at goal at that point in time.  His LDL was 71.  He has an upcoming physical and will have lipids then.  He is not fasting today.  6.  Atrial fibrillation  -Dr. Allyson Sabal has stopped his Eliquis because of a number of falls.  7.  Abnormal brain scan  -Patient has prominence of the ventricles.  Whether this is due to atrophy or not has been up for debate.  His ventricle size has been stable for a long time and most of the radiologists felt that the issue was due to atrophy but the most recent radiologist felt that ventricles were out of proportion to atrophy.  the  radiologist recently did agree, however, that ventricle size has been stable.  regardless, I think the idea of NPH is likely very low on the differential, especially given the fact that he has had an abnormal DaTscan.  I certainly offered them a lumbar puncture now and we discussed risks and benefits of this.  This is a bit easier now that he is off of the apixaban.  We could also just proceed with skin biopsy for alpha-synuclein.  He previously had a second opinion at [facility] less than a year ago and they also thought he had idiopathic Parkinsons disease.  After quite some discussion, they would like to proceed with a lumbar puncture.  We will get that scheduled.  Subjective:   Walter Brown was seen today in follow up for Parkinsons disease.  My previous records were reviewed prior to todays visit as well as outside records available to me.  Son accompanies the patient and supplements the history.  Last visit, I did send a note to cardiology because I was concerned about the number of falls given the fact that he was on Eliquis.  Dr. Allyson Sabal saw him not long thereafter and ended up discontinuing the Eliquis.  Patient was in the emergency room January 28  with a fall.  He was riding his scooter (which I told him not to ride anymore last visit) and he fell and hit his head on a wooden board.  He previously had a similar injury and hit his head on a cabinet.  CT brain was nonacute, but he did require staples.  They did note "stable prominence of the lateral ventricles may be related to central predominant atrophy, although a component of normal pressure/communicating hydrocephalus cannot be excluded."  Films are reviewed.  Patient was in the emergency room March 18 with dizziness and several falls.  CT brain again was done, this time read by different radiologist and noted "stable prominence of the lateral ventricles out of proportion to the degree of cerebral atrophy, which may reflect underlying normal pressure  hydrocephalus."  Patient was told by the emergency room physician that his physical exam was most consistent with "developing normal pressure hydrocephalus."  He presented today to follow-up.   Current prescribed movement disorder medications: Carbidopa/levodopa 25/100, 2 at 7am, 2 at 11am, 1 at 3pm, 7pm (increased) Carbidopa/levodopa 50/200 at bedtime   ALLERGIES:   Allergies  Allergen Reactions   Albuterol Sulfate Palpitations   Azithromycin Other (See Comments)    Hepatotoxicity   Doxycycline Hyclate Other (See Comments)    Taken with Azithromycin and had Heaptotoxicity    CURRENT MEDICATIONS:  Outpatient Encounter Medications as of 01/01/2024  Medication Sig   acetaminophen (TYLENOL) 500 MG tablet Take 2 tablets (1,000 mg total) by mouth every 6 (six) hours as needed for moderate pain (pain score 4-6).   amiodarone (PACERONE) 200 MG tablet TAKE 1/2 TABLET BY MOUTH DAILY   bacitracin ointment Apply 1 Application topically 2 (two) times daily.   benzonatate (TESSALON) 100 MG capsule TAKE 1-2 CAPSULES (100-200 MG TOTAL) BY MOUTH 2 (TWO) TIMES DAILY AS NEEDED FOR COUGH.   budesonide-formoterol (SYMBICORT) 160-4.5 MCG/ACT inhaler Inhale 2 puffs into the lungs 2 (two) times daily.   calcium-vitamin D (OSCAL WITH D) 500-5 MG-MCG tablet Take 1 tablet by mouth.   carbidopa-levodopa (SINEMET CR) 50-200 MG tablet TAKE 1 TABLET BY MOUTH EVERYDAY AT BEDTIME   carbidopa-levodopa (SINEMET IR) 25-100 MG tablet TAKE 1 TABLET BY MOUTH 3 (THREE) TIMES DAILY. 7AM/11AM/4PM CAN TAKE AN EXTRA AS NEEDED   divalproex (DEPAKOTE) 125 MG DR tablet TAKE 1 TABLET (125 MG TOTAL) BY MOUTH AT BEDTIME   ezetimibe (ZETIA) 10 MG tablet TAKE 1 TABLET BY MOUTH EVERY DAY   guaifenesin (ROBITUSSIN) 100 MG/5ML syrup Take 10 mLs (200 mg total) by mouth 3 (three) times daily as needed for cough.   hydrochlorothiazide (HYDRODIURIL) 25 MG tablet TAKE 1 TABLET BY MOUTH EVERY DAY   levalbuterol (XOPENEX HFA) 45 MCG/ACT inhaler  INHALE 1 TO 2 PUFFS BY MOUTH EVERY 6 HOURS AS NEEDED FOR WHEEZE   Melatonin 10 MG TABS Take 10 mg by mouth daily as needed.   mirabegron ER (MYRBETRIQ) 50 MG TB24 tablet Take 50 mg by mouth daily.   Multiple Vitamins-Minerals (ICAPS AREDS 2 PO) Take 1 capsule by mouth in the morning and at bedtime.   pantoprazole (PROTONIX) 40 MG tablet Take 1 tablet (40 mg total) by mouth daily as needed.   predniSONE (DELTASONE) 10 MG tablet 3 tabs x 3 days, 2 tabs x 3 days, 1 tab x 3 days   Semaglutide-Weight Management (WEGOVY) 0.25 MG/0.5ML SOAJ Inject 0.25 mg into the skin once a week.   sertraline (ZOLOFT) 100 MG tablet TAKE 1 TABLET BY MOUTH EVERY DAY  solifenacin (VESICARE) 5 MG tablet Take 5 mg by mouth daily.   tamsulosin (FLOMAX) 0.4 MG CAPS capsule Take 1 capsule (0.4 mg total) by mouth daily.   No facility-administered encounter medications on file as of 01/01/2024.    Objective:   PHYSICAL EXAMINATION:    VITALS:   There were no vitals filed for this visit.   GEN:  The patient appears stated age and is in NAD. HEENT:  Normocephalic, atraumatic.  The mucous membranes are moist.  Cardiovascular: Regular rate rhythm (no A-fib today) Lungs: Clear to auscultation bilaterally Neck: No bruits Neurological examination:  Orientation: The patient is alert and oriented x3.   Cranial nerves: There is good facial symmetry with facial hypomimia. The speech is fluent and clear. Soft palate rises symmetrically and there is no tongue deviation. Hearing is intact to conversational tone. Sensation: Sensation is intact to light touch throughout Motor: Strength is at least antigravity x4.  Movement examination: Tone: There is mild increased tone in the RUE Abnormal movements: there is RLE rest tremor Coordination:  There is minimal decremation with hand opening and closing on the right. Gait and Station: The patient pushes off to arise.  He is given a walker.  He is short stepped.  He drags the right  leg.  His right leg freezes.  I have reviewed and interpreted the following labs independently    Chemistry      Component Value Date/Time   NA 138 12/23/2023 1655   NA 140 07/09/2023 1236   K 3.4 (L) 12/23/2023 1655   CL 102 12/23/2023 1655   CO2 28 12/23/2023 1655   BUN 20 12/23/2023 1655   BUN 17 07/09/2023 1236   CREATININE 0.99 12/23/2023 1655   CREATININE 1.16 10/20/2020 1426      Component Value Date/Time   CALCIUM 8.7 (L) 12/23/2023 1655   ALKPHOS 54 10/30/2023 1200   AST 16 10/30/2023 1200   ALT 6 10/30/2023 1200   BILITOT 0.8 10/30/2023 1200   BILITOT 0.6 07/09/2023 1236       Lab Results  Component Value Date   WBC 7.9 12/23/2023   HGB 13.7 12/23/2023   HCT 40.0 12/23/2023   MCV 94.1 12/23/2023   PLT 153 12/23/2023    Lab Results  Component Value Date   TSH 1.10 10/30/2023     Total time spent on today's visit was 57 minutes, including both face-to-face time and nonface-to-face time.  Time included that spent on review of records (prior notes available to me/labs/imaging if pertinent), discussing treatment and goals, answering patient's questions and coordinating care.  Cc:  [name], MD

## 2024-01-01 ENCOUNTER — Encounter: Payer: Self-pay | Admitting: Neurology

## 2024-01-01 ENCOUNTER — Ambulatory Visit: Admitting: Neurology

## 2024-01-01 VITALS — BP 128/78 | HR 64

## 2024-01-01 DIAGNOSIS — G20A2 Parkinson's disease without dyskinesia, with fluctuations: Secondary | ICD-10-CM

## 2024-01-01 DIAGNOSIS — G20A1 Parkinson's disease without dyskinesia, without mention of fluctuations: Secondary | ICD-10-CM | POA: Diagnosis not present

## 2024-01-01 DIAGNOSIS — F02818 Dementia in other diseases classified elsewhere, unspecified severity, with other behavioral disturbance: Secondary | ICD-10-CM

## 2024-01-01 DIAGNOSIS — G919 Hydrocephalus, unspecified: Secondary | ICD-10-CM

## 2024-01-02 ENCOUNTER — Other Ambulatory Visit: Payer: Self-pay

## 2024-01-02 ENCOUNTER — Telehealth: Payer: Self-pay | Admitting: Neurology

## 2024-01-02 ENCOUNTER — Telehealth: Payer: Self-pay | Admitting: Emergency Medicine

## 2024-01-02 ENCOUNTER — Telehealth: Payer: Self-pay

## 2024-01-02 ENCOUNTER — Ambulatory Visit: Admitting: Physician Assistant

## 2024-01-02 ENCOUNTER — Encounter: Payer: Self-pay | Admitting: Neurology

## 2024-01-02 DIAGNOSIS — F0284 Dementia in other diseases classified elsewhere, unspecified severity, with anxiety: Secondary | ICD-10-CM | POA: Diagnosis not present

## 2024-01-02 DIAGNOSIS — G20A2 Parkinson's disease without dyskinesia, with fluctuations: Secondary | ICD-10-CM

## 2024-01-02 DIAGNOSIS — G919 Hydrocephalus, unspecified: Secondary | ICD-10-CM

## 2024-01-02 DIAGNOSIS — I2510 Atherosclerotic heart disease of native coronary artery without angina pectoris: Secondary | ICD-10-CM | POA: Diagnosis not present

## 2024-01-02 DIAGNOSIS — F411 Generalized anxiety disorder: Secondary | ICD-10-CM | POA: Diagnosis not present

## 2024-01-02 DIAGNOSIS — I10 Essential (primary) hypertension: Secondary | ICD-10-CM | POA: Diagnosis not present

## 2024-01-02 DIAGNOSIS — I480 Paroxysmal atrial fibrillation: Secondary | ICD-10-CM | POA: Diagnosis not present

## 2024-01-02 DIAGNOSIS — G20A1 Parkinson's disease without dyskinesia, without mention of fluctuations: Secondary | ICD-10-CM | POA: Diagnosis not present

## 2024-01-02 DIAGNOSIS — D649 Anemia, unspecified: Secondary | ICD-10-CM | POA: Diagnosis not present

## 2024-01-02 DIAGNOSIS — F339 Major depressive disorder, recurrent, unspecified: Secondary | ICD-10-CM | POA: Diagnosis not present

## 2024-01-02 DIAGNOSIS — F0283 Dementia in other diseases classified elsewhere, unspecified severity, with mood disturbance: Secondary | ICD-10-CM | POA: Diagnosis not present

## 2024-01-02 NOTE — Telephone Encounter (Signed)
 Copied from CRM [id number]. Topic: General - Other >> Jan 02, 2024  3:31 PM Jon Gills C wrote: Reason for CRM: Melissa from [facility] called stated she Sent Aaric to ER last week had fluid on his brain has a total of 23 falls in the last 30 days - saw Dr. Arbutus Leas suppose to make a referral but hasnt have any response wanted to know if Dr.Blyth could do something for him about that referral was put on hold and then hung up, was unable to get Melissa back on the line

## 2024-01-02 NOTE — Telephone Encounter (Signed)
 Walter Brown with [facility] called. Wants to know when the lumbar puncture is going to be set up. The pt wife is helping the pt and they dont want her to get hurt. She wants a call today to make sure this is getting done

## 2024-01-02 NOTE — Telephone Encounter (Signed)
 Prior Auth is approved and I called central scheduling and they directed me to call main PT office at [facility] as they schedule all there own LP and PT assist . Called patients wife to let her know I was unable to reach anyone and had to leave them a detailed message to please call and get patient scheduled

## 2024-01-02 NOTE — Telephone Encounter (Signed)
 I called Melissa back with [facility] to let her know I had left the patients wife a detailed message stating that the PA was done and I had called PT to get the lumbar puncture completed ASAP for this patient

## 2024-01-05 ENCOUNTER — Ambulatory Visit: Admitting: Physical Therapy

## 2024-01-05 ENCOUNTER — Other Ambulatory Visit: Payer: Self-pay

## 2024-01-05 DIAGNOSIS — Z0189 Encounter for other specified special examinations: Secondary | ICD-10-CM

## 2024-01-05 DIAGNOSIS — G919 Hydrocephalus, unspecified: Secondary | ICD-10-CM

## 2024-01-05 NOTE — Telephone Encounter (Signed)
 Appointment has been made for 01/06/24 at 9 am

## 2024-01-05 NOTE — Telephone Encounter (Signed)
 Called and spoke with Melissa from [facility]. She said Walter Brown was discharged from occupational therapy on Friday (01/02/24). He is having a lumbar puncture done today by Dr Tat. The next steps are physical and occupational therapy. Pt has an office visit scheduled for 01/08/24. Per Efraim Kaufmann, his wife would really benefit from speaking with provider.

## 2024-01-05 NOTE — Telephone Encounter (Signed)
 Called and spoke with patient's wife. She said she can wait and talk with provider at office visit this Thursday.

## 2024-01-06 ENCOUNTER — Ambulatory Visit: Admitting: Family Medicine

## 2024-01-06 NOTE — Assessment & Plan Note (Signed)
 He notes his balance is worsening some. He is struggling with increasing falls and instability.

## 2024-01-06 NOTE — Assessment & Plan Note (Signed)
 Blood thinner stopped due to increased rate of falls

## 2024-01-06 NOTE — Assessment & Plan Note (Signed)
 Well controlled, no changes to meds. Encouraged heart healthy diet such as the DASH diet and exercise as tolerated.

## 2024-01-06 NOTE — Assessment & Plan Note (Signed)
 [id number] acceptable, minimize simple carbs. Increase exercise as tolerated.

## 2024-01-08 ENCOUNTER — Ambulatory Visit (INDEPENDENT_AMBULATORY_CARE_PROVIDER_SITE_OTHER): Admitting: Family Medicine

## 2024-01-08 ENCOUNTER — Other Ambulatory Visit (HOSPITAL_COMMUNITY): Payer: Self-pay | Admitting: Student

## 2024-01-08 VITALS — BP 120/71 | HR 60 | Temp 98.1°F | Resp 16 | Ht 65.0 in | Wt 178.0 lb

## 2024-01-08 DIAGNOSIS — F0670 Mild neurocognitive disorder due to known physiological condition without behavioral disturbance: Secondary | ICD-10-CM

## 2024-01-08 DIAGNOSIS — R296 Repeated falls: Secondary | ICD-10-CM | POA: Diagnosis not present

## 2024-01-08 DIAGNOSIS — I10 Essential (primary) hypertension: Secondary | ICD-10-CM

## 2024-01-08 DIAGNOSIS — R739 Hyperglycemia, unspecified: Secondary | ICD-10-CM | POA: Diagnosis not present

## 2024-01-08 DIAGNOSIS — G20A1 Parkinson's disease without dyskinesia, without mention of fluctuations: Secondary | ICD-10-CM | POA: Diagnosis not present

## 2024-01-08 DIAGNOSIS — G20A2 Parkinson's disease without dyskinesia, with fluctuations: Secondary | ICD-10-CM

## 2024-01-08 DIAGNOSIS — I480 Paroxysmal atrial fibrillation: Secondary | ICD-10-CM | POA: Diagnosis not present

## 2024-01-08 DIAGNOSIS — G919 Hydrocephalus, unspecified: Secondary | ICD-10-CM

## 2024-01-08 NOTE — Patient Instructions (Signed)
Preventing Falls and Fractures  Falls can be very serious, especially for older adults or people with osteoporosis  Falls can be caused by:  Tripping or slipping  Slow reflexes  Balance problems  Reduced muscle strength  Poor vision or a recent change in prescription  Illness and some medications (especially blood pressure pills, diuretics, heart medicines, muscle relaxants and sleep medications)  Drinking alcohol  To prevent falls outdoors:  Use a can or walker if needed  Wear rubber-soled shoes so you don't slip  DO NOT buy "shape up" shoes with rocker bottom soles if you have balance problems.  The thick soles and shape make it more difficult to keep your balance.  Put kitty litter or salt on icy sidewalks  Walk on the grass if the sidewalks are slick  Avoid walking on uneven ground whenever possible  T prevent falls indoors:  Keep rooms clutter-free, especially hallways, stairs and paths to light switches  Remove throw rugs  Install night lights, especially to and in the bathroom  Turn on lights before going downstairs  Keep a flashlight next to your bed  Buy a cordless phone to keep with you instead of jumping up to answer the phone  Install grab bars in the bathroom near the shower and toilet  Install rails on both sides of the stairs.  Make sure the stairs are well lit  Wear slippers with non-skid soles.  Do not walk around in stockings or socks  Balance problems and dizziness are not a normal part of growing older.  If you begin having balance problems or dizziness see your doctor.  Physical Therapy can help you with many balance problems, strengthening hip and leg muscles and with gait training.  To keep your bones healthy make sure you are getting enough calcium and Vitamin D each day.  Ask your doctor or pharmacist about supplements.  Regular weight-bearing exercise like walking, lifting weights or dancing can help strengthen bones and prevent  osteoporosis.  

## 2024-01-09 ENCOUNTER — Encounter: Payer: Self-pay | Admitting: Family Medicine

## 2024-01-09 ENCOUNTER — Telehealth: Payer: Self-pay

## 2024-01-09 NOTE — Progress Notes (Signed)
 Subjective:    Patient ID: Walter Brown, male    DOB: 04-30-1941, 83 y.o.   MRN: [id number]  Chief Complaint  Patient presents with   Follow-up    Hospital    HPI Discussed the use of AI scribe software for clinical note transcription with the patient, who gave verbal consent to proceed.  History of Present Illness USTIN CRUICKSHANK is a 83 year old male with Parkinson's disease and hydrocephalus who presents with recurrent falls and voice changes. He is accompanied by [name], his son and his wife  He is experiencing recurrent falls, which have become more frequent recently, with incidents on March 24th, 25th, and 26th. He has difficulty with balance and unsteadiness, impacting his ability to perform daily activities safely. No specific injuries have been reported from these falls.  He reports a change in his voice, describing it as 'fading' over the last six weeks. This symptom developed after the increase in fall frequency. A caregiver noticed a significant change in his condition over a week, prompting further medical evaluation.  A CT scan revealed fluid on the brain, diagnosed as normal pressure hydrocephalus. There is no history of trauma associated with this finding.  He has Parkinson's disease, which contributes to his mobility issues. There have been discussions about adjusting his Parkinson's medication in the past, but specific details were not provided in this conversation.  He uses a wheelchair for mobility, but there are concerns about his ability to operate it safely due to a shaky joystick, which is being addressed with a part replacement.  He struggles with maintaining adequate hydration, particularly with water intake, and primarily consumes diet green tea. He uses a laxative to manage bowel movements and reports no blood in stools. No headaches, vision, or hearing changes. He leans to one side, which has changed direction over time.    Past Medical History:  Diagnosis  Date   A-fib [facility])    Allergy    Anemia    mild   Arthritis 04/06/2017   Asthma    childhood   ASVD (arteriosclerotic vascular disease) 09/07/2018   Back pain with radiation 04/26/2013   Low back with LLE radiculopathy   Basal cell carcinoma    skin- on nose- basal cell (20 yrs ago) forehead 1 year ago     Sees Dr Elmon Else of dermatology   BPH (benign prostatic hyperplasia)    Bradycardia 06/28/2018   Cataract 10/07/2014   Cerumen impaction 08/10/2012   Chicken pox as child   Coronary artery calcification 11/23/2019   Dysphagia 09/11/2021   Elevated LFTs    Essential hypertension    ETD (Eustachian tube dysfunction), right 01/17/2022   Generalized anxiety disorder    [place] measles as a child   Hemoptysis 01/17/2022   Hyperglycemia 04/25/2020   Hyperlipidemia    Insomnia 02/06/2021   Internal hemorrhoids    Irregular cardiac rhythm 03/10/2018   Lipoma 08/04/2018   Macular degeneration    Major depressive disorder 08/01/2012   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   Nocturia 12/04/2022   Obstructive sleep apnea 11/09/2018   Patient reports mild symptoms; was not prescribed a CPAP machine   Otitis externa 08/10/2012   PAF (paroxysmal atrial fibrillation)    Palpitations 09/07/2018   Parkinson's disease (HCC) 05/19/2019   Recurrent falls 12/04/2022   Thrombocytopenia    TIA (transient ischemic attack) 03/10/2018   Unsteady gait 09/11/2021    Past Surgical History:  Procedure Laterality  Date   BELPHAROPTOSIS REPAIR     very young, b/l   EXCISIONAL HEMORRHOIDECTOMY     EYE SURGERY  2017   eyelids   HYDROCELE EXCISION / REPAIR  2012   b/l   SKIN CANCER EXCISION     nose and forehead, basal cell CA   UPPER GASTROINTESTINAL ENDOSCOPY      Family History  Problem Relation Age of Onset   Hypertension Mother    Other Mother        MRSA   Heart disease Mother        stents   ADD / ADHD Mother    Arthritis Mother     Asthma Mother    Depression Mother    Vision loss Mother    Heart disease Father    Hypertension Father    Prostate cancer Father 77   Parkinson's disease Father    Cancer Father 60   Hearing loss Father    Stroke Father    Vision loss Father    Varicose Veins Father    Stroke Maternal Grandmother    Prostate cancer Maternal Grandfather    Asthma Maternal Grandfather    Cancer Maternal Grandfather    Cancer Paternal Grandfather        EYE   Hearing loss Paternal Grandfather    ADD / ADHD Son        ADHD   Other Son 24       part of 1 lung removed- due to infection   Alcohol abuse Son    Depression Son    Alcohol abuse Son    Alcohol abuse Maternal Uncle    Depression Maternal Uncle    Asthma Maternal Uncle    Heart disease Paternal Uncle    Colon cancer Neg Hx    Esophageal cancer Neg Hx    Rectal cancer Neg Hx    Stomach cancer Neg Hx     Social History   Socioeconomic History   Marital status: Married    Spouse name: Not on file   Number of children: 2   Years of education: 16   Highest education level: Bachelor's degree (e.g., BA, AB, BS)  Occupational History   Occupation: Retired  Tobacco Use   Smoking status: Former    Current packs/day: 0.00    Average packs/day: 1.5 packs/day for 15.0 years (22.5 ttl pk-yrs)    Types: Cigarettes    Start date: 10/06/1978    Quit date: 10/07/1978    Years since quitting: 45.2   Smokeless tobacco: Never   Tobacco comments:    QUIT 1980  Vaping Use   Vaping status: Never Used  Substance and Sexual Activity   Alcohol use: Yes    Alcohol/week: 7.0 standard drinks of alcohol    Types: 7 Glasses of wine per week    Comment: wine with meals   Drug use: Not Currently   Sexual activity: Yes    Comment: lives with wife, 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  Other Topics Concern   Not on file  Social History Narrative   Right handed    Occasionally caffeine   Retired    Lives with wife   Social Drivers of Corporate investment banker Strain: Low Risk  (03/26/2022)   Overall Financial Resource Strain (CARDIA)    Difficulty of Paying Living Expenses: Not hard at all  Food Insecurity: No Food Insecurity (04/01/2023)   Hunger Vital Sign  Worried About Programme researcher, broadcasting/film/video in the Last Year: Never true    Ran Out of Food in the Last Year: Never true  Transportation Needs: No Transportation Needs (04/01/2023)   PRAPARE - Administrator, Civil Service (Medical): No    Lack of Transportation (Non-Medical): No  Physical Activity: Sufficiently Active (03/26/2022)   Exercise Vital Sign    Days of Exercise per Week: 5 days    Minutes of Exercise per Session: 50 min  Stress: Stress Concern Present (03/26/2022)   [employer] of Occupational Health - Occupational Stress Questionnaire    Feeling of Stress : To some extent  Social Connections: Moderately Isolated (03/26/2022)   Social Connection and Isolation Panel [NHANES]    Frequency of Communication with Friends and Family: More than three times a week    Frequency of Social Gatherings with Friends and Family: Once a week    Attends Religious Services: Never    Database administrator or Organizations: No    Attends Banker Meetings: Never    Marital Status: Married  Catering manager Violence: Not At Risk (03/26/2022)   Humiliation, Afraid, Rape, and Kick questionnaire    Fear of Current or Ex-Partner: No    Emotionally Abused: No    Physically Abused: No    Sexually Abused: No    Outpatient Medications Prior to Visit  Medication Sig Dispense Refill   acetaminophen (TYLENOL) 500 MG tablet Take 2 tablets (1,000 mg total) by mouth every 6 (six) hours as needed for moderate pain (pain score 4-6). 90 tablet 0   amiodarone (PACERONE) 200 MG tablet TAKE 1/2 TABLET BY MOUTH DAILY 45 tablet 3   bacitracin ointment Apply 1 Application topically 2 (two) times daily. 120 g 0   benzonatate  (TESSALON) 100 MG capsule TAKE 1-2 CAPSULES (100-200 MG TOTAL) BY MOUTH 2 (TWO) TIMES DAILY AS NEEDED FOR COUGH. 40 capsule 1   budesonide-formoterol (SYMBICORT) 160-4.5 MCG/ACT inhaler Inhale 2 puffs into the lungs 2 (two) times daily. 1 each 3   calcium-vitamin D (OSCAL WITH D) 500-5 MG-MCG tablet Take 1 tablet by mouth.     carbidopa-levodopa (SINEMET CR) 50-200 MG tablet TAKE 1 TABLET BY MOUTH EVERYDAY AT BEDTIME 90 tablet 0   carbidopa-levodopa (SINEMET IR) 25-100 MG tablet TAKE 1 TABLET BY MOUTH 3 (THREE) TIMES DAILY. 7AM/11AM/4PM CAN TAKE AN EXTRA AS NEEDED 270 tablet 0   divalproex (DEPAKOTE) 125 MG DR tablet TAKE 1 TABLET (125 MG TOTAL) BY MOUTH AT BEDTIME 90 tablet 3   ezetimibe (ZETIA) 10 MG tablet TAKE 1 TABLET BY MOUTH EVERY DAY 90 tablet 3   guaifenesin (ROBITUSSIN) 100 MG/5ML syrup Take 10 mLs (200 mg total) by mouth 3 (three) times daily as needed for cough.     hydrochlorothiazide (HYDRODIURIL) 25 MG tablet TAKE 1 TABLET BY MOUTH EVERY DAY 90 tablet 1   levalbuterol (XOPENEX HFA) 45 MCG/ACT inhaler INHALE 1 TO 2 PUFFS BY MOUTH EVERY 6 HOURS AS NEEDED FOR WHEEZE 15 each 1   Melatonin 10 MG TABS Take 10 mg by mouth daily as needed.     mirabegron ER (MYRBETRIQ) 50 MG TB24 tablet Take 50 mg by mouth daily.     Multiple Vitamins-Minerals (ICAPS AREDS 2 PO) Take 1 capsule by mouth in the morning and at bedtime.     pantoprazole (PROTONIX) 40 MG tablet Take 1 tablet (40 mg total) by mouth daily as needed.     predniSONE (DELTASONE) 10 MG tablet  3 tabs x 3 days, 2 tabs x 3 days, 1 tab x 3 days 18 tablet 0   Semaglutide-Weight Management (WEGOVY) 0.25 MG/0.5ML SOAJ Inject 0.25 mg into the skin once a week. 2 mL 1   sertraline (ZOLOFT) 100 MG tablet TAKE 1 TABLET BY MOUTH EVERY DAY 90 tablet 1   solifenacin (VESICARE) 5 MG tablet Take 5 mg by mouth daily.     tamsulosin (FLOMAX) 0.4 MG CAPS capsule Take 1 capsule (0.4 mg total) by mouth daily.     No facility-administered medications  prior to visit.    Allergies  Allergen Reactions   Albuterol Sulfate Palpitations   Azithromycin Other (See Comments)    Hepatotoxicity   Doxycycline Hyclate Other (See Comments)    Taken with Azithromycin and had Heaptotoxicity    Review of Systems  Constitutional:  Positive for malaise/fatigue. Negative for fever.  HENT:  Negative for congestion.   Eyes:  Negative for blurred vision.  Respiratory:  Negative for shortness of breath.   Cardiovascular:  Negative for chest pain, palpitations and leg swelling.  Gastrointestinal:  Negative for abdominal pain, blood in stool and nausea.  Genitourinary:  Negative for dysuria and frequency.  Musculoskeletal:  Positive for falls.  Skin:  Negative for rash.  Neurological:  Positive for weakness. Negative for dizziness, loss of consciousness and headaches.  Endo/Heme/Allergies:  Negative for environmental allergies.  Psychiatric/Behavioral:  Negative for depression. The patient is not nervous/anxious.        Objective:    Physical Exam Vitals reviewed.  Constitutional:      Appearance: Normal appearance. He is not ill-appearing.     Comments: In a wheelchair  HENT:     Head: Normocephalic and atraumatic.     Nose: Nose normal.  Eyes:     Conjunctiva/sclera: Conjunctivae normal.  Cardiovascular:     Rate and Rhythm: Normal rate.     Pulses: Normal pulses.     Heart sounds: Normal heart sounds. No murmur heard. Pulmonary:     Effort: Pulmonary effort is normal.     Breath sounds: Normal breath sounds. No wheezing.  Abdominal:     Palpations: Abdomen is soft. There is no mass.     Tenderness: There is no abdominal tenderness.  Musculoskeletal:     Cervical back: Normal range of motion.     Right lower leg: No edema.     Left lower leg: No edema.  Skin:    General: Skin is warm and dry.  Neurological:     Mental Status: He is alert and oriented to person, place, and time.  Psychiatric:        Mood and Affect: Mood normal.      BP 120/71 (BP Location: Left Arm, Patient Position: Sitting, Cuff Size: Normal)   Pulse 60   Temp 98.1 F (36.7 C) (Oral)   Resp 16   Ht 5\' 5"  (1.651 m)   Wt 178 lb (80.7 kg)   SpO2 98%   BMI 29.62 kg/m  Wt Readings from Last 3 Encounters:  01/08/24 178 lb (80.7 kg)  12/23/23 180 lb (81.6 kg)  12/05/23 164 lb 12.8 oz (74.8 kg)    Diabetic Foot Exam - Simple   No data filed    Lab Results  Component Value Date   WBC 7.9 12/23/2023   HGB 13.7 12/23/2023   HCT 40.0 12/23/2023   PLT 153 12/23/2023   GLUCOSE 94 12/23/2023   CHOL 158 10/30/2023   TRIG 84.0 10/30/2023  HDL 63.60 10/30/2023   LDLCALC 78 10/30/2023   ALT 6 10/30/2023   AST 16 10/30/2023   NA 138 12/23/2023   K 3.4 (L) 12/23/2023   CL 102 12/23/2023   CREATININE 0.99 12/23/2023   BUN 20 12/23/2023   CO2 28 12/23/2023   TSH 1.10 10/30/2023   INR 1.0 12/15/2017   HGBA1C 5.4 10/30/2023    Lab Results  Component Value Date   TSH 1.10 10/30/2023   Lab Results  Component Value Date   WBC 7.9 12/23/2023   HGB 13.7 12/23/2023   HCT 40.0 12/23/2023   MCV 94.1 12/23/2023   PLT 153 12/23/2023   Lab Results  Component Value Date   NA 138 12/23/2023   K 3.4 (L) 12/23/2023   CO2 28 12/23/2023   GLUCOSE 94 12/23/2023   BUN 20 12/23/2023   CREATININE 0.99 12/23/2023   BILITOT 0.8 10/30/2023   ALKPHOS 54 10/30/2023   AST 16 10/30/2023   ALT 6 10/30/2023   PROT 6.3 10/30/2023   ALBUMIN 4.1 10/30/2023   CALCIUM 8.7 (L) 12/23/2023   ANIONGAP 8 12/23/2023   EGFR 74 07/09/2023   GFR 75.47 10/30/2023   Lab Results  Component Value Date   CHOL 158 10/30/2023   Lab Results  Component Value Date   HDL 63.60 10/30/2023   Lab Results  Component Value Date   LDLCALC 78 10/30/2023   Lab Results  Component Value Date   TRIG 84.0 10/30/2023   Lab Results  Component Value Date   CHOLHDL 2 10/30/2023   Lab Results  Component Value Date   HGBA1C 5.4 10/30/2023       Assessment & Plan:   Hyperglycemia Assessment & Plan: hgba1c acceptable, minimize simple carbs. Increase exercise as tolerated.    Mild neurocognitive disorder due to Parkinson's disease [facility]) Assessment & Plan: He notes his balance is worsening some. He is struggling with increasing falls and instability.   PAF (paroxysmal atrial fibrillation) Assessment & Plan: Blood thinner stopped due to increased rate of falls   Essential hypertension Assessment & Plan: Well controlled, no changes to meds. Encouraged heart healthy diet such as the DASH diet and exercise as tolerated.     Parkinson's disease with fluctuating manifestations, unspecified whether dyskinesia present (HCC) -     AMB Referral [facility] Care Management -     Ambulatory referral to Home Health  Hydrocephalus, unspecified type (HCC) -     AMB Referral [facility] Care Management -     Ambulatory referral to Home Health  Recurrent falls -     AMB Referral [facility] Care Management -     Ambulatory referral to Home Health    Assessment and Plan Assessment & Plan Normal pressure hydrocephalus Normal pressure hydrocephalus with increased falls and weaker voice. Lumbar puncture scheduled for symptom evaluation. - Proceed with scheduled lumbar puncture. - Coordinate with physical therapy post-procedure. - Ensure ongoing occupational therapy. - Refer to value-based care team for support.  Recurrent falls Increased falls due to unsteadiness. Ongoing therapy required. - Order ongoing occupational and physical therapy. - Activate long-term health care insurance for personal care services. - Refer to value-based care team for support.  Parkinson's disease Parkinson's with fluctuating symptoms and medication management concerns. Wheelchair joystick stabilization needed. - Continue current Parkinson's medication regimen. - Monitor for symptom changes and adjust treatment as necessary. - Consult neurologist for potential medication  adjustments.  Goals of Care Goal to maintain home care with adequate support. Long-term care facilities  as last resort. - Activate long-term care insurance for personal care services. - Refer to value-based care team for support options. - Discuss potential reimbursement for medical equipment with insurance.  Follow-up Plan to assess progress and address needs in a few weeks. - Schedule a virtual follow-up appointment in 3-4 weeks. - Ensure all home health services and equipment needs are addressed. - Provide prescriptions for necessary medical equipment if required.     Danise Edge, MD

## 2024-01-14 NOTE — Progress Notes (Signed)
 Complex Care Management Note  Care Guide Note 01/14/2024 Name: Walter Brown MRN: [id number] DOB: 1941/02/19  Walter Brown is a 83 y.o. year old male who sees [name], MD for primary care. I reached out to [name] by phone today to offer complex care management services.  [name] was given information about Complex Care Management services today including:   The Complex Care Management services include support from the care team which includes your Nurse Care Manager, Clinical Social Worker, or Pharmacist.  The Complex Care Management team is here to help remove barriers to the health concerns and goals most important to you. Complex Care Management services are voluntary, and the patient may decline or stop services at any time by request to their care team member.   Complex Care Management Consent Status: Patient agreed to services and verbal consent obtained.   Follow up plan:  Telephone appointment with complex care management team member scheduled for:  01/22/24 & 01/23/24.  Encounter Outcome:  Patient Scheduled  [facility]  [facility] Care Management Assistant Direct Dial: [phone]  Fax: [phone]

## 2024-01-15 ENCOUNTER — Telehealth: Payer: Self-pay

## 2024-01-15 DIAGNOSIS — H353112 Nonexudative age-related macular degeneration, right eye, intermediate dry stage: Secondary | ICD-10-CM | POA: Diagnosis not present

## 2024-01-15 DIAGNOSIS — H353221 Exudative age-related macular degeneration, left eye, with active choroidal neovascularization: Secondary | ICD-10-CM | POA: Diagnosis not present

## 2024-01-15 DIAGNOSIS — H35372 Puckering of macula, left eye: Secondary | ICD-10-CM | POA: Diagnosis not present

## 2024-01-15 DIAGNOSIS — H43813 Vitreous degeneration, bilateral: Secondary | ICD-10-CM | POA: Diagnosis not present

## 2024-01-15 NOTE — Telephone Encounter (Signed)
 Noted.

## 2024-01-15 NOTE — Telephone Encounter (Signed)
 Copied from CRM [id number]. Topic: Clinical - Home Health Verbal Orders >> Jan 15, 2024  9:39 AM Fredrich Romans wrote: Caller/Ag[facility]elle [facility] Callback Number: [phone]   Frances Furbish called to report that Patient has requested to delay his start of care until 01/17/2024

## 2024-01-16 ENCOUNTER — Telehealth: Payer: Self-pay | Admitting: Neurology

## 2024-01-16 ENCOUNTER — Other Ambulatory Visit: Payer: Self-pay

## 2024-01-16 ENCOUNTER — Ambulatory Visit (HOSPITAL_COMMUNITY)
Admission: RE | Admit: 2024-01-16 | Discharge: 2024-01-16 | Disposition: A | Discharge status: Home / Self Care | Source: Ambulatory Visit | Attending: Neurology | Admitting: Neurology

## 2024-01-16 ENCOUNTER — Ambulatory Visit (HOSPITAL_COMMUNITY)
Admission: RE | Admit: 2024-01-16 | Discharge: 2024-01-16 | Disposition: A | Discharge status: Home / Self Care | Source: Ambulatory Visit | Attending: Family Medicine | Admitting: Family Medicine

## 2024-01-16 DIAGNOSIS — R42 Dizziness and giddiness: Secondary | ICD-10-CM | POA: Insufficient documentation

## 2024-01-16 DIAGNOSIS — G919 Hydrocephalus, unspecified: Secondary | ICD-10-CM | POA: Insufficient documentation

## 2024-01-16 LAB — CSF CELL COUNT WITH DIFFERENTIAL
RBC Count, CSF: 1 /mm3 — ABNORMAL HIGH
Tube #: 3
WBC, CSF: 1 /mm3 (ref 0–5)

## 2024-01-16 LAB — PROTEIN AND GLUCOSE, CSF
Glucose, CSF: 54 mg/dL (ref 40–70)
Total  Protein, CSF: 66 mg/dL — ABNORMAL HIGH (ref 15–45)

## 2024-01-16 MED ORDER — LIDOCAINE 1 % OPTIME INJ - NO CHARGE
5.0000 mL | Freq: Once | INTRAMUSCULAR | Status: AC
  Administered 2024-01-16: 5 mL via INTRADERMAL
  Filled 2024-01-16: qty 6

## 2024-01-16 NOTE — Telephone Encounter (Signed)
 I left a Voicemail for the patient to callback to make a VV follow up with Dr Tat will try back later

## 2024-01-16 NOTE — Procedures (Signed)
 PROCEDURE SUMMARY:  Successful fluoroscopic guided lumbar puncture at the level of L4-5.  Opening pressure was 25 cm H2O ~30 mL clear colorless fluid collected and sent for labs.  No immediate complications.  Pt tolerated well.   EBL = none  Please see full dictation in imaging section of Epic for procedure details.   Electronically Signed: Jama Flavors, PA-C 01/16/2024, 11:15 AM

## 2024-01-18 ENCOUNTER — Encounter: Payer: Self-pay | Admitting: Neurology

## 2024-01-18 DIAGNOSIS — I10 Essential (primary) hypertension: Secondary | ICD-10-CM | POA: Diagnosis not present

## 2024-01-18 DIAGNOSIS — Z9181 History of falling: Secondary | ICD-10-CM | POA: Diagnosis not present

## 2024-01-18 DIAGNOSIS — Z7951 Long term (current) use of inhaled steroids: Secondary | ICD-10-CM | POA: Diagnosis not present

## 2024-01-18 DIAGNOSIS — G919 Hydrocephalus, unspecified: Secondary | ICD-10-CM | POA: Diagnosis not present

## 2024-01-18 DIAGNOSIS — I480 Paroxysmal atrial fibrillation: Secondary | ICD-10-CM | POA: Diagnosis not present

## 2024-01-18 DIAGNOSIS — G20A2 Parkinson's disease without dyskinesia, with fluctuations: Secondary | ICD-10-CM | POA: Diagnosis not present

## 2024-01-18 DIAGNOSIS — G3184 Mild cognitive impairment, so stated: Secondary | ICD-10-CM | POA: Diagnosis not present

## 2024-01-19 ENCOUNTER — Telehealth: Payer: Self-pay | Admitting: Neurology

## 2024-01-19 LAB — CSF CULTURE W GRAM STAIN
Culture: NO GROWTH
Gram Stain: NONE SEEN

## 2024-01-19 NOTE — Telephone Encounter (Signed)
 Left message with for patient to call back and make a VV with Tat

## 2024-01-19 NOTE — Telephone Encounter (Signed)
 Pt. Wife calling looking for a nurse call or sooner appt as husband has had Lumbar Puncture and would like to know next steps

## 2024-01-20 NOTE — Telephone Encounter (Signed)
 This patient has been called several times with messages left to schedule. Please call back to schedule

## 2024-01-20 NOTE — Telephone Encounter (Signed)
 I called pt 826am 01/20/24 to see if he would like to come see Dr.Tat on 01/21/24. Had to leave a message

## 2024-01-21 ENCOUNTER — Telehealth: Payer: Self-pay

## 2024-01-21 ENCOUNTER — Ambulatory Visit: Attending: Neurology | Admitting: Physical Therapy

## 2024-01-21 DIAGNOSIS — G919 Hydrocephalus, unspecified: Secondary | ICD-10-CM | POA: Diagnosis not present

## 2024-01-21 DIAGNOSIS — I10 Essential (primary) hypertension: Secondary | ICD-10-CM | POA: Diagnosis not present

## 2024-01-21 DIAGNOSIS — Z9181 History of falling: Secondary | ICD-10-CM | POA: Diagnosis not present

## 2024-01-21 DIAGNOSIS — G3184 Mild cognitive impairment, so stated: Secondary | ICD-10-CM | POA: Diagnosis not present

## 2024-01-21 DIAGNOSIS — I480 Paroxysmal atrial fibrillation: Secondary | ICD-10-CM | POA: Diagnosis not present

## 2024-01-21 DIAGNOSIS — Z7951 Long term (current) use of inhaled steroids: Secondary | ICD-10-CM | POA: Diagnosis not present

## 2024-01-21 DIAGNOSIS — G20A2 Parkinson's disease without dyskinesia, with fluctuations: Secondary | ICD-10-CM | POA: Diagnosis not present

## 2024-01-21 NOTE — Telephone Encounter (Signed)
 Copied from CRM [id number]. Topic: General - Other >> Jan 21, 2024  4:21 PM Aisha D wrote: Reason for CRM: [name] from [facility] is calling to report that the patient recently fell. [name] stated that she seen the patient today and he has no injuries or bruises and the patient stated that he feels ok. [name] stated that she just wanted to report that to Dr.Blyth to keep her updated.

## 2024-01-22 ENCOUNTER — Telehealth: Payer: Self-pay

## 2024-01-22 ENCOUNTER — Other Ambulatory Visit: Payer: Self-pay

## 2024-01-22 NOTE — Patient Outreach (Signed)
 Complex Care Management   Visit Note  01/22/2024  Name:  Walter Brown MRN: [id number] DOB: October 24, 1940  Situation: Referral received for Complex Care Management related to  Parkinsons Disease  I obtained verbal consent from Patient.  Visit completed with patient and spouse   on the phone  Background:   Past Medical History:  Diagnosis Date   A-fib [facility])    Allergy    Anemia    mild   Arthritis 04/06/2017   Asthma    childhood   ASVD (arteriosclerotic vascular disease) 09/07/2018   Back pain with radiation 04/26/2013   Low back with LLE radiculopathy   Basal cell carcinoma    skin- on nose- basal cell (20 yrs ago) forehead 1 year ago     Sees Dr Elmon Else of dermatology   BPH (benign prostatic hyperplasia)    Bradycardia 06/28/2018   Cataract 10/07/2014   Cerumen impaction 08/10/2012   Chicken pox as child   Coronary artery calcification 11/23/2019   Dysphagia 09/11/2021   Elevated LFTs    Essential hypertension    ETD (Eustachian tube dysfunction), right 01/17/2022   Generalized anxiety disorder    [place] measles as a child   Hemoptysis 01/17/2022   Hyperglycemia 04/25/2020   Hyperlipidemia    Insomnia 02/06/2021   Internal hemorrhoids    Irregular cardiac rhythm 03/10/2018   Lipoma 08/04/2018   Macular degeneration    Major depressive disorder 08/01/2012   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   Nocturia 12/04/2022   Obstructive sleep apnea 11/09/2018   Patient reports mild symptoms; was not prescribed a CPAP machine   Otitis externa 08/10/2012   PAF (paroxysmal atrial fibrillation)    Palpitations 09/07/2018   Parkinson's disease (HCC) 05/19/2019   Recurrent falls 12/04/2022   Thrombocytopenia    TIA (transient ischemic attack) 03/10/2018   Unsteady gait 09/11/2021    Assessment: Patient Reported Symptoms:  Cognitive Cognitive Status: Alert and oriented to person, place, and time      Neurological   Neurological  Conditions: Parkinson's disease Neurological Management Strategies: Adequate rest, Coping strategies, Medication therapy Neurological Self-Management Outcome: 3 (uncertain)  HEENT HEENT Symptoms Reported: No symptoms reported, Change or loss of hearing (wears hearing aid) HEENT Management Strategies: Medical device    Cardiovascular Cardiovascular Symptoms Reported: No symptoms reported (has Afib, states no symptoms in more than a year) Cardiovascular Management Strategies: Medication therapy, Routine screening Cardiovascular Self-Management Outcome: 4 (good)  Respiratory Respiratory Symptoms Reported: Productive cough Additional Respiratory Details: medication prescribed    Endocrine Patient reports the following symptoms related to hypoglycemia or hyperglycemia : No symptoms reported    Gastrointestinal Gastrointestinal Symptoms Reported: Incontinence Gastrointestinal Conditions: Fecal incontinence Gastrointestinal Management Strategies: Incontinence garment/pad Gastrointestinal Self-Management Outcome: 4 (good) Nutrition Risk Screen (CP): No indicators present  Genitourinary   Genitourinary Management Strategies: Incontinence garment/pad  Integumentary Integumentary Symptoms Reported: No symptoms reported    Musculoskeletal Musculoskelatal Symptoms Reviewed: Difficulty walking, Unsteady gait, Weakness (due to Parkinsons Disease) Musculoskeletal Conditions: Unsteady gait (currently under treatment with HH PT OT) Musculoskeletal Management Strategies: Medical device, Exercise Musculoskeletal Self-Management Outcome: 2 (bad) Falls in the past year?: Yes Number of falls in past year: 2 or more Was there an injury with Fall?: Yes (concussion injury) Fall Risk Category Calculator: 3 Patient Fall Risk Level: High Fall Risk Patient at Risk for Falls Due to: History of fall(s), Impaired balance/gait, Impaired mobility Fall risk Follow up: Falls evaluation completed, Education provided,  Falls prevention discussed  Psychosocial Psychosocial Symptoms Reported: Depression - if selected complete PHQ 2-9   Major Change/Loss/Stressor/Fears (CP): Medical condition, self Behaviors When Feeling Stressed/Fearful: watch tv, talk to his spouse, has appt with LCSW 01/23/24 Techniques to Cope with Loss/Stress/Change: Counseling, Diversional activities Quality of Family Relationships: helpful, involved, supportive Do you feel physically threatened by others?: No      12/05/2023    1:47 PM  Depression screen PHQ 2/9  Decreased Interest 3  Down, Depressed, Hopeless 1  PHQ - 2 Score 4  Altered sleeping 0  Tired, decreased energy 3  Change in appetite 0  Feeling bad or failure about yourself  2  Trouble concentrating 2  Moving slowly or fidgety/restless 3  Suicidal thoughts 0  PHQ-9 Score 14  Difficult doing work/chores Somewhat difficult    Vitals:   01/15/24 1556  BP: 134/84    Medications Reviewed Today     Reviewed by Ruel Favors, RN (Registered Nurse) on 01/22/24 at 1610  Med List Status: <None>   Medication Order Taking? Sig Documenting Provider Last Dose Status Informant  acetaminophen (TYLENOL) 500 MG tablet [id number] No Take 2 tablets (1,000 mg total) by mouth every 6 (six) hours as needed for moderate pain (pain score 4-6).  Patient not taking: Reported on 01/22/2024   Fayrene Helper, PA-C Not Taking Active   amiodarone (PACERONE) 200 MG tablet [id number] Yes TAKE 1/2 TABLET BY MOUTH DAILY Runell Gess, MD Taking Active   bacitracin ointment [id number] No Apply 1 Application topically 2 (two) times daily.  Patient not taking: Reported on 01/22/2024   Halford Decamp, PA-C Not Taking Active            Med Note Julian Reil, Jeris Easterly   Thu Jan 22, 2024  4:00 PM) Completed course  benzonatate (TESSALON) 100 MG capsule [id number] Yes TAKE 1-2 CAPSULES (100-200 MG TOTAL) BY MOUTH 2 (TWO) TIMES DAILY AS NEEDED FOR COUGH. [name], MD Taking Active    budesonide-formoterol [facility]) 160-4.5 MCG/ACT inhaler [id number] Yes Inhale 2 puffs into the lungs 2 (two) times daily. Wanda Plump, MD Taking Active   calcium-vitamin D Ruthell Rummage WITH D) 500-5 MG-MCG tablet [id number] Yes Take 1 tablet by mouth. [provider] Taking Active   carbidopa-levodopa (SINEMET CR) 50-200 MG tablet [id number] Yes TAKE 1 TABLET BY MOUTH EVERYDAY AT BEDTIME Tat, Octaviano Batty, DO Taking Active            Med Note Julian Reil, Nakeisha Greenhouse   Thu Jan 22, 2024  4:02 PM) Patient reports dosage changed to 2 50 mg tablets at bedtime  carbidopa-levodopa (SINEMET IR) 25-100 MG tablet [id number] Yes TAKE 1 TABLET BY MOUTH 3 (THREE) TIMES DAILY. 7AM/11AM/4PM CAN TAKE AN EXTRA AS NEEDED Tat, Octaviano Batty, DO Taking Active   divalproex (DEPAKOTE) 125 MG DR tablet [id number] Yes TAKE 1 TABLET (125 MG TOTAL) BY MOUTH AT BEDTIME Marcos Eke, PA-C Taking Active   ezetimibe (ZETIA) 10 MG tablet [id number] Yes TAKE 1 TABLET BY MOUTH EVERY DAY Alver Sorrow, NP Taking Active   guaifenesin (ROBITUSSIN) 100 MG/5ML syrup [id number] Yes Take 10 mLs (200 mg total) by mouth 3 (three) times daily as needed for cough. Wanda Plump, MD Taking Active   hydrochlorothiazide (HYDRODIURIL) 25 MG tablet [id number] Yes TAKE 1 TABLET BY MOUTH EVERY DAY [name], MD Taking Active   levalbuterol [facility] HFA) 45 MCG/ACT inhaler [id number] Yes INHALE 1 TO 2 PUFFS BY MOUTH EVERY 6 HOURS AS NEEDED FOR WHEEZE [name],  MD Taking Active   Melatonin 10 MG TABS [id number] Yes Take 10 mg by mouth daily as needed. [provider] Taking Active   mirabegron ER (MYRBETRIQ) 50 MG TB24 tablet [id number] No Take 50 mg by mouth daily.  Patient not taking: Reported on 01/22/2024   [provider] Not Taking Active   Multiple Vitamins-Minerals (ICAPS AREDS 2 PO) [id number] Yes Take 1 capsule by mouth in the morning and at bedtime. [provider] Taking Active   pantoprazole (PROTONIX) 40 MG tablet  [id number] Yes Take 1 tablet (40 mg total) by mouth daily as needed. Dorrene Gaucher, NP Taking Active   predniSONE (DELTASONE) 10 MG tablet [id number] No 3 tabs x 3 days, 2 tabs x 3 days, 1 tab x 3 days  Patient not taking: Reported on 01/22/2024   Ezell Hollow, MD Not Taking Active            Med Note Burley Carpenter, Governor Matos   Thu Jan 22, 2024  4:04 PM) Completed course  Semaglutide-Weight Management (WEGOVY) 0.25 MG/0.5ML Stevens Eland [id number]  Inject 0.25 mg into the skin once a week. Neda Balk, MD  Active            Med Note Burley Carpenter, Aubreana Cornacchia   Thu Jan 22, 2024  4:08 PM) Patient cost is prohibitive even with PA from [place].  Message sent to PCP to inquire about option  sertraline (ZOLOFT) 100 MG tablet [id number] Yes TAKE 1 TABLET BY MOUTH EVERY DAY Neda Balk, MD Taking Active   solifenacin (VESICARE) 5 MG tablet [id number] No Take 5 mg by mouth daily.  Patient not taking: Reported on 01/22/2024   [provider] Not Taking Active   tamsulosin (FLOMAX) 0.4 MG CAPS capsule [id number] No Take 1 capsule (0.4 mg total) by mouth daily.  Patient not taking: Reported on 01/22/2024   Dorrene Gaucher, NP Not Taking Active            Med Note Burley Carpenter, Marlet Korte   Thu Jan 22, 2024  4:10 PM) Patient not taking, denies urinary/bladder concerns            Recommendation:   PCP Follow-up RNCM sent message to PCP to inquire about possible lower cost alternatives to [facility]  Follow Up Plan:   Telephone follow up appointment date/time:  02/06/24 at 3:00   Clarnce Crow BSN RN CCM [place]  [facility], [name] RN Care Manager Direct Dial: [phone] Fax: [phone]

## 2024-01-22 NOTE — Patient Instructions (Signed)
 Visit Information  Thank you for taking time to visit with me today. Please don't hesitate to contact me if I can be of assistance to you before our next scheduled appointment.  Our next appointment is by telephone on 02/06/24 at 3:00 Please call the care guide team at [phone] if you need to cancel or reschedule your appointment.   Following is a copy of your care plan:   Goals Addressed             This Visit's Progress    5       Problems:  Chronic Disease Management support and education needs related to Parkinsons Disease  Goal: Over the next 30 days the Patient will attend all scheduled medical appointments: 4/l18/25 VBCI SW, 01/27/24 Neurology, 02/03/24 PCP, 03/03/24 PCP, 03/30/24 Neuropsyche as evidenced by chart review and patient report        continue to work with RN Care Manager and/or Social Worker to address care management and care coordination needs related to Parkinsons Disease as evidenced by adherence to care management team scheduled appointments     demonstrate ongoing self health care management ability to reduce fall risk as evidenced by     not experience hospital admission as evidenced by review of electronic medical record. Hospital Admissions in last 6 months = 5 take all medications exactly as prescribed and will call provider for medication related questions as evidenced by chart review and patient report    verbalize understanding of plan for management of fall risk as evidenced by verbal teach back, asking questions, attending all physical therapy home health appointments, using all assistive devices as instructed, and calling providers office with concerns. work with Child psychotherapist to address ADL IADL limitations and Inability to perform ADL's independently related to the management of Parkinsons Disease as evidenced by review of electronic medical record and patient or social worker report      Interventions:   Evaluation of current treatment plan related  to  Parkinsons Disease , Level of care concerns, ADL IADL limitations, Inability to perform ADL's independently, and Lacks knowledge of community resource: fall risk  self-management and patient's adherence to plan as established by provider. Discussed plans with patient for ongoing care management follow up and provided patient with direct contact information for care management team Evaluation of current treatment plan related to Parkinsons Disease and patient's adherence to plan as established by provider Provided education to patient re: safety assessment and transfer techniques Reviewed medications with patient and discussed calling pharmacy for refill requests Reviewed scheduled/upcoming provider appointments including PCP and specialists Discussed plans with patient for ongoing care management follow up and provided patient with direct contact information for care management team Advised patient to discuss weakening voice concerns with provider Screening for signs and symptoms of depression related to chronic disease state   Patient Self-Care Activities:  Attend all scheduled provider appointments Call provider office for new concerns or questions  Take medications as prescribed   Work with the social worker to address care coordination needs and will continue to work with the clinical team to address health care and disease management related needs Work with the pharmacist to address medication management needs and will continue to work with the clinical team to address health care and disease management related needs  Plan:  Follow up with provider re: option for alternative to [facility] that will be lower cost to patient. Telephone follow up appointment with care management team member scheduled for:  02/06/24 at  3:00             Please call the Suicide and Crisis Lifeline: 988 call the [place]  National Suicide Prevention Lifeline: 5747504890 or TTY: 681-857-5434 TTY  321-126-3609) to talk to a trained counselor call 1-800-273-TALK (toll free, 24 hour hotline) if you are experiencing a Mental Health or Behavioral Health Crisis or need someone to talk to.  Patient verbalizes understanding of instructions and care plan provided today and agrees to view in MyChart. Active MyChart status and patient understanding of how to access instructions and care plan via MyChart confirmed with patient.      Clarnce Crow BSN RN CCM [place]  [facility], [facility] RN Care Manager Direct Dial: [phone] Fax: [phone]

## 2024-01-22 NOTE — Progress Notes (Signed)
 Complex Care Management Care Guide Note  01/22/2024 Name: Walter Brown MRN: [id number] DOB: 05/01/1941  Walter Brown is a 83 y.o. year old male who is a primary care patient of Neda Balk, MD and is actively engaged with the care management team. I reached out to Meridee Standing by phone today to assist with re-scheduling  with the RN Case Manager.  Follow up plan: Telephone appointment with complex care management team member scheduled for:  01/22/24 at 3:30 p.m.   [facility]  [place] [place] [facility], [facility] Care Management Assistant Direct Dial: [phone]  Fax: [phone]

## 2024-01-23 ENCOUNTER — Other Ambulatory Visit: Payer: Self-pay | Admitting: Licensed Clinical Social Worker

## 2024-01-23 NOTE — Patient Outreach (Signed)
Called pt twice left voicemail

## 2024-01-26 ENCOUNTER — Telehealth: Payer: Self-pay | Admitting: Pharmacist

## 2024-01-26 NOTE — Telephone Encounter (Signed)
 01/26/2024 Name: [name] MRN: [id number] DOB: 04-05-1941  No chief complaint on file.   Walter Brown is a 83 y.o. year old male who presented for a telephone visit.   They were referred to the pharmacist by their PCP for assistance in managing medication access.    Subjective:  Received the message below from Walter Brown, [facility]  Walter Brown, Walter Brown, [place]  Walter Crow, RN; Walter Balk, MD; Cecilie Coffee, RPH-CPP Good afternoon,  I am passing this message along to my teammate Walter Brown to loop her in; because she is the clinical pharmacist that previously spoke with this patient.  Thank you!  Linn Rich, PharmD, DPLA   From: Walter Crow, RN On: 01/22/2024 04:50 PM  To: Walter Balk, MD, Linn Rich, [facility]  Priority: Routine  Routing Comments:  Dr. Rodrick Clapper  I would like to know if there is a cheaper alternative for the rx for Wegovy ?  As per Walter Brown's discussion with patient insurance, even with PA the cost is prohibitive.   Walter Brown BSN RN CCM [place]  [facility], [facility] RN Care Manager Direct Dial: [phone] Fax: [phone]     Care Team: Primary Care Provider: Neda Balk, MD ; Next Scheduled Visit: 02/03/2024 Neurologist - Dr Tat - next appointment 01/27/2024   Medication Access/Adherence  Current Pharmacy:  [phone] - [place], [place] - [address] [place]  [address] Phone: [phone] Fax: [phone]   Patient reports affordability concerns with their medications: Yes  - Patient reports that cost of Wegovy  was $600 per month Patient reports access/transportation concerns to their pharmacy: No  Patient reports adherence concerns with their medications:  No     Patient has ASCVD and was prescribed Wegovy  for secondary prevention. We got prior authorization for Wegovy  in Feb 2025 but on his [facility] plan it is tier 4 which means  his portion of the cost of Wegovy  is 40% of cost or around $600. Patient has not filled Wegovy  yet.    Objective:  Lab Results  Component Value Date   HGBA1C 5.4 10/30/2023    Lab Results  Component Value Date   CREATININE 0.99 12/23/2023   BUN 20 12/23/2023   NA 138 12/23/2023   K 3.4 (L) 12/23/2023   CL 102 12/23/2023   CO2 28 12/23/2023    Lab Results  Component Value Date   CHOL 158 10/30/2023   HDL 63.60 10/30/2023   LDLCALC 78 10/30/2023   TRIG 84.0 10/30/2023   CHOLHDL 2 10/30/2023    Medications Reviewed Today   Medications were not reviewed in this encounter       Assessment/Plan:   Medication Access  - Prior authorization has been approved for Wegovy  by [employer] from 10/08/2023 thru 10/06/2024, however it is still non preferrd status tier 4 which cost is 40% of medications cost which is why patient's copay is still $600.  - [employer] has no tier expection for Wegovy . It is likely after he spend $2000 out of pocket that cost will decreased to $0 per new 2025 Medicare rules. He could also ask to use the Medicare payment option which would spread the $2000 out over the last 9 to 10 months (cost would be about $200 to $250 per month.  Called [employer] - patient still has $450 deductible to meet. He has spent $637 out of $2000 out of pocket so far in  2025.  - In March 2025, Novo Nordisk started a direct to [employer], quantity. Walter Brown could possibly use this option and lower Wegovy  cost to $499 / month. LM on patient's VM to call me to discuss. Will also sent MyChart message with information. If he is interested in this option he can either contact me or discuss with Dr Rodrick Clapper at visit next week. (Using Novo Care option will NOT count toward any insurance deductible, or max out of pocket expenses)  - If he chooses to use Novo Care option, PCP would send Rx to [facility]. Patient would then receive information with directions on how to create a [facility] account  where he would make payments and schedule prescription delivery. Wegovy  would be shipped directed to his home from [facility] pharmacy partner)     Cecilie Coffee, PharmD Clinical Pharmacist [facility] Primary Care  Population Health [phone]

## 2024-01-26 NOTE — Progress Notes (Unsigned)
 Assessment/Plan:   1.  Parkinsons Disease  -DaT scan abnormal in the past  -continue  carbidopa /levodopa , 2 at 7am, 2 at 11am, 1 at 3pm, 7pm  -continue carbidopa /levodopa  50/200 at bed   -Patient had 2nd opinion at [facility] in 02/2023 and dx was agreed upon  -only walk with someone directly walking with him with gait belt and walker.  Otherwise, no walking  -he has a scooter and an electric wheelchair but I want him to get rid of the scooter.   -they plan to access his LTC insurance and I support that   -he asks about DBS.  Don't think likely good candidate but he is also well treated medically and dbs doesn't help falls (his biggest issue).  We will see what neurocog testing shows in June  2.  Memory change  -Patient with evidence of MCI on neurocog testing in 12/2022.  Suspect PDD now.  He is scheduled for repeat testing in June.  -Patient used to like to read and he is not doing it any longer.  We talked about trying to write his life story with the help of his caregivers.  - He is currently on Depakote , 125 mg bid.    3.  Depression  -Primary care prescribing sertraline , 100 mg daily.    4.  Urinary incontinenece  -follows with [facility].  5.  Incidental infarct, 04/2023  -This was completely asymptomatic and noted on MRI of the brain that was done by our PA for many change  -MRA head and neck was unremarkable.   6.  Atrial fibrillation  -Dr. Katheryne Pane has stopped his Eliquis  because of a number of falls.  7.  Abnormal brain scan  -Patient has prominence of the ventricles.  Whether this is due to atrophy or not has been up for debate.  His ventricle size has been stable for a long time and most of the radiologists felt that the issue was due to atrophy but the most recent radiologist felt that ventricles were out of proportion to atrophy.  the radiologist recently did agree, however, that ventricle size has been stable.  He subsequently underwent high low-volume lumbar puncture  January 16, 2024.  PT felt that he did better post lumbar puncture than prelumbar puncture.  I am very leery about the idea of undergoing shunt in the presence of an abnormal DaTscan, although patient's could possibly have an abnormal DaTscan with nph.  I think we should go ahead and proceed with skin biopsy for alpha-synuclein and see what that looks like before he proceeds with a surgery for shunt.  They are agreeable to do that but they also would like concurrent referral to neurosx for possible shunt.  I sent email correspondence/staff message to Dr. Larrie Po about the patient since he is complicated case. Subjective:   Walter Brown was seen today in follow up for Parkinsons disease.  My previous records were reviewed prior to todays visit as well as outside records available to me.  Son accompanies the patient and supplements the history.  I saw the patient less than a month ago.  At that point in time, they wanted to proceed with high-volume lumbar puncture.  Physical therapy felt that overall he improved post lumbar puncture.  Wife states that walking was a "little better" and speech was better.     Current prescribed movement disorder medications: Carbidopa /levodopa  25/100, 2 at 7am, 2 at 11am, 1 at 3pm, 7pm (increased) Carbidopa /levodopa  50/200 at bedtime  ALLERGIES:   Allergies  Allergen Reactions   Albuterol  Sulfate Palpitations   Azithromycin Other (See Comments)    Hepatotoxicity   Doxycycline Hyclate Other (See Comments)    Taken with Azithromycin and had Heaptotoxicity    CURRENT MEDICATIONS:  Outpatient Encounter Medications as of 01/27/2024  Medication Sig   acetaminophen  (TYLENOL ) 500 MG tablet Take 2 tablets (1,000 mg total) by mouth every 6 (six) hours as needed for moderate pain (pain score 4-6).   amiodarone  (PACERONE ) 200 MG tablet TAKE 1/2 TABLET BY MOUTH DAILY   bacitracin  ointment Apply 1 Application topically 2 (two) times daily.   benzonatate  (TESSALON ) 100 MG  capsule TAKE 1-2 CAPSULES (100-200 MG TOTAL) BY MOUTH 2 (TWO) TIMES DAILY AS NEEDED FOR COUGH.   budesonide -formoterol  (SYMBICORT ) 160-4.5 MCG/ACT inhaler Inhale 2 puffs into the lungs 2 (two) times daily.   calcium -vitamin D (OSCAL WITH D) 500-5 MG-MCG tablet Take 1 tablet by mouth.   carbidopa -levodopa  (SINEMET  CR) 50-200 MG tablet TAKE 1 TABLET BY MOUTH EVERYDAY AT BEDTIME   carbidopa -levodopa  (SINEMET  IR) 25-100 MG tablet TAKE 1 TABLET BY MOUTH 3 (THREE) TIMES DAILY. 7AM/11AM/4PM CAN TAKE AN EXTRA AS NEEDED   divalproex  (DEPAKOTE ) 125 MG DR tablet TAKE 1 TABLET (125 MG TOTAL) BY MOUTH AT BEDTIME   ezetimibe  (ZETIA ) 10 MG tablet TAKE 1 TABLET BY MOUTH EVERY DAY   guaifenesin  (ROBITUSSIN) 100 MG/5ML syrup Take 10 mLs (200 mg total) by mouth 3 (three) times daily as needed for cough.   hydrochlorothiazide  (HYDRODIURIL ) 25 MG tablet TAKE 1 TABLET BY MOUTH EVERY DAY   levalbuterol  (XOPENEX  HFA) 45 MCG/ACT inhaler INHALE 1 TO 2 PUFFS BY MOUTH EVERY 6 HOURS AS NEEDED FOR WHEEZE   Melatonin 10 MG TABS Take 10 mg by mouth daily as needed.   mirabegron ER (MYRBETRIQ) 50 MG TB24 tablet Take 50 mg by mouth daily.   Multiple Vitamins-Minerals (ICAPS AREDS 2 PO) Take 1 capsule by mouth in the morning and at bedtime.   pantoprazole  (PROTONIX ) 40 MG tablet Take 1 tablet (40 mg total) by mouth daily as needed.   Semaglutide -Weight Management (WEGOVY ) 0.25 MG/0.5ML SOAJ Inject 0.25 mg into the skin once a week.   sertraline  (ZOLOFT ) 100 MG tablet TAKE 1 TABLET BY MOUTH EVERY DAY   solifenacin (VESICARE) 5 MG tablet Take 5 mg by mouth daily.   tamsulosin  (FLOMAX ) 0.4 MG CAPS capsule Take 1 capsule (0.4 mg total) by mouth daily.   No facility-administered encounter medications on file as of 01/27/2024.    Objective:   PHYSICAL EXAMINATION:    VITALS:   Vitals:   01/27/24 1545  BP: (!) 140/80  Pulse: 71  SpO2: 98%  Height: 5\' 5"  (1.651 m)   Virtually 100% of visit in counseling today  GEN:  The  patient appears stated age and is in NAD.   Orientation: The patient is alert and oriented x3.   Cranial nerves: There is good facial symmetry with facial hypomimia. The speech is fluent and clear but hypophonic.   I have reviewed and interpreted the following labs independently    Chemistry      Component Value Date/Time   NA 138 12/23/2023 1655   NA 140 07/09/2023 1236   K 3.4 (L) 12/23/2023 1655   CL 102 12/23/2023 1655   CO2 28 12/23/2023 1655   BUN 20 12/23/2023 1655   BUN 17 07/09/2023 1236   CREATININE 0.99 12/23/2023 1655   CREATININE 1.16 10/20/2020 1426  Component Value Date/Time   CALCIUM  8.7 (L) 12/23/2023 1655   ALKPHOS 54 10/30/2023 1200   AST 16 10/30/2023 1200   ALT 6 10/30/2023 1200   BILITOT 0.8 10/30/2023 1200   BILITOT 0.6 07/09/2023 1236       Lab Results  Component Value Date   WBC 7.9 12/23/2023   HGB 13.7 12/23/2023   HCT 40.0 12/23/2023   MCV 94.1 12/23/2023   PLT 153 12/23/2023    Lab Results  Component Value Date   TSH 1.10 10/30/2023     Total time spent on today's visit was 30 minutes, including both face-to-face time and nonface-to-face time.  Time included that spent on review of records (prior notes available to me/labs/imaging if pertinent), discussing treatment and goals, answering patient's questions and coordinating care.  Cc:  Neda Balk, MD

## 2024-01-27 ENCOUNTER — Ambulatory Visit: Admitting: Neurology

## 2024-01-27 ENCOUNTER — Other Ambulatory Visit: Payer: Self-pay | Admitting: Neurology

## 2024-01-27 ENCOUNTER — Encounter: Payer: Self-pay | Admitting: Neurology

## 2024-01-27 VITALS — BP 140/80 | HR 71 | Ht 65.0 in

## 2024-01-27 DIAGNOSIS — R413 Other amnesia: Secondary | ICD-10-CM | POA: Diagnosis not present

## 2024-01-27 DIAGNOSIS — G919 Hydrocephalus, unspecified: Secondary | ICD-10-CM

## 2024-01-27 DIAGNOSIS — G20A2 Parkinson's disease without dyskinesia, with fluctuations: Secondary | ICD-10-CM

## 2024-01-28 ENCOUNTER — Telehealth: Payer: Self-pay

## 2024-01-28 ENCOUNTER — Telehealth: Payer: Self-pay | Admitting: Neurology

## 2024-01-28 DIAGNOSIS — I10 Essential (primary) hypertension: Secondary | ICD-10-CM | POA: Diagnosis not present

## 2024-01-28 DIAGNOSIS — G919 Hydrocephalus, unspecified: Secondary | ICD-10-CM | POA: Diagnosis not present

## 2024-01-28 DIAGNOSIS — I480 Paroxysmal atrial fibrillation: Secondary | ICD-10-CM | POA: Diagnosis not present

## 2024-01-28 DIAGNOSIS — Z9181 History of falling: Secondary | ICD-10-CM | POA: Diagnosis not present

## 2024-01-28 DIAGNOSIS — Z7951 Long term (current) use of inhaled steroids: Secondary | ICD-10-CM | POA: Diagnosis not present

## 2024-01-28 DIAGNOSIS — G20A2 Parkinson's disease without dyskinesia, with fluctuations: Secondary | ICD-10-CM | POA: Diagnosis not present

## 2024-01-28 DIAGNOSIS — G3184 Mild cognitive impairment, so stated: Secondary | ICD-10-CM | POA: Diagnosis not present

## 2024-01-28 NOTE — Progress Notes (Signed)
 Complex Care Management Note  Care Guide Note 01/28/2024 Name: Walter Brown MRN: [id number] DOB: 05/04/1941  Walter Brown is a 83 y.o. year old male who sees Neda Balk, MD for primary care. I reached out to Meridee Standing by phone today to offer complex care management services.  [name] was given information about Complex Care Management services today including:   The Complex Care Management services include support from the care team which includes your Nurse Care Manager, Clinical Social Worker, or Pharmacist.  The Complex Care Management team is here to help remove barriers to the health concerns and goals most important to you. Complex Care Management services are voluntary, and the patient may decline or stop services at any time by request to their care team member.   Complex Care Management Consent Status: Patient agreed to services and verbal consent obtained.   Follow up plan:  Telephone appointment with complex care management team member scheduled for:  01/30/24 at 11:00 a.m.   Encounter Outcome:  Patient Scheduled  [facility]  [facility], [facility] Health Care Management Assistant Direct Dial: [phone]  Fax: [phone]

## 2024-01-28 NOTE — Telephone Encounter (Signed)
 Went over dosage with patients wife

## 2024-01-28 NOTE — Telephone Encounter (Signed)
 Pt.s wife Would like to discuss dosage and needs assistance to provide correct dosage to spouse

## 2024-01-29 ENCOUNTER — Ambulatory Visit: Payer: Self-pay

## 2024-01-29 DIAGNOSIS — Z7951 Long term (current) use of inhaled steroids: Secondary | ICD-10-CM | POA: Diagnosis not present

## 2024-01-29 DIAGNOSIS — G919 Hydrocephalus, unspecified: Secondary | ICD-10-CM | POA: Diagnosis not present

## 2024-01-29 DIAGNOSIS — G20A2 Parkinson's disease without dyskinesia, with fluctuations: Secondary | ICD-10-CM | POA: Diagnosis not present

## 2024-01-29 DIAGNOSIS — G3184 Mild cognitive impairment, so stated: Secondary | ICD-10-CM | POA: Diagnosis not present

## 2024-01-29 DIAGNOSIS — I480 Paroxysmal atrial fibrillation: Secondary | ICD-10-CM | POA: Diagnosis not present

## 2024-01-29 DIAGNOSIS — I10 Essential (primary) hypertension: Secondary | ICD-10-CM | POA: Diagnosis not present

## 2024-01-29 DIAGNOSIS — Z9181 History of falling: Secondary | ICD-10-CM | POA: Diagnosis not present

## 2024-01-29 NOTE — Telephone Encounter (Signed)
 Copied from CRM [id number]. Topic: Clinical - Red Word Triage >> Jan 29, 2024  3:05 PM Alpha Arts wrote: Red Word that prompted transfer to Nurse Triage: [facility] Nurse Loris Ros stated patient had another fall on Sunday, 4/20 with no injuries   Chief Complaint: Provider to Provider Call   Frequency: Last Weekend, Sunday   Disposition: [] ED /[] Urgent Care (no appt availability in office) / [] Appointment(In office/virtual)/ []  [facility]/ [] Home Care/ [] Refused Recommended Disposition /[] [facility]/ [x]  Follow-up with PCP  Additional Notes: [facility] Nurse, Loris Ros called to inform practice of the patient having a fall with no injury this past Sunday, 01-25-2024  Spoke with Janie on the CAL to relay information.

## 2024-01-29 NOTE — Telephone Encounter (Signed)
 Patient has appointment scheduled on 02/03/24.

## 2024-01-30 ENCOUNTER — Other Ambulatory Visit: Payer: Self-pay | Admitting: Licensed Clinical Social Worker

## 2024-01-30 DIAGNOSIS — Z9181 History of falling: Secondary | ICD-10-CM | POA: Diagnosis not present

## 2024-01-30 DIAGNOSIS — Z7951 Long term (current) use of inhaled steroids: Secondary | ICD-10-CM | POA: Diagnosis not present

## 2024-01-30 DIAGNOSIS — I10 Essential (primary) hypertension: Secondary | ICD-10-CM | POA: Diagnosis not present

## 2024-01-30 DIAGNOSIS — G3184 Mild cognitive impairment, so stated: Secondary | ICD-10-CM | POA: Diagnosis not present

## 2024-01-30 DIAGNOSIS — G919 Hydrocephalus, unspecified: Secondary | ICD-10-CM | POA: Diagnosis not present

## 2024-01-30 DIAGNOSIS — G20A2 Parkinson's disease without dyskinesia, with fluctuations: Secondary | ICD-10-CM | POA: Diagnosis not present

## 2024-01-30 DIAGNOSIS — I480 Paroxysmal atrial fibrillation: Secondary | ICD-10-CM | POA: Diagnosis not present

## 2024-01-30 NOTE — Patient Instructions (Signed)
 Visit Information  Thank you for taking time to visit with me today. Please don't hesitate to contact me if I can be of assistance to you before our next scheduled appointment.   Patient has met all care management goals. Care Management case will be closed. Patient has been provided contact information should new needs arise.   Please call the care guide team at [phone] if you need to cancel, schedule, or reschedule an appointment.   Please call 911 if you are experiencing a Mental Health or Behavioral Health Crisis or need someone to talk to.  Gwyndolyn Saxon MSW, LCSW Licensed Clinical Social Worker  [facility], Population Health Direct Dial: [phone]  Fax: [phone]

## 2024-01-30 NOTE — Patient Outreach (Signed)
 Complex Care Management   Visit Note  01/30/2024  Name:  Walter Brown MRN: [id number] DOB: 11-Sep-1941  Situation: Referral received for Complex Care Management related to  pcs assistance  I obtained verbal consent from Caregiver.  Visit completed with Walter Brown pt spouse   on the phone  Background:   Past Medical History:  Diagnosis Date   A-fib [facility])    Allergy    Anemia    mild   Arthritis 04/06/2017   Asthma    childhood   ASVD (arteriosclerotic vascular disease) 09/07/2018   Back pain with radiation 04/26/2013   Low back with LLE radiculopathy   Basal cell carcinoma    skin- on nose- basal cell (20 yrs ago) forehead 1 year ago     Sees Dr Thais Fill of dermatology   BPH (benign prostatic hyperplasia)    Bradycardia 06/28/2018   Cataract 10/07/2014   Cerumen impaction 08/10/2012   Chicken pox as child   Coronary artery calcification 11/23/2019   Dysphagia 09/11/2021   Elevated LFTs    Essential hypertension    ETD (Eustachian tube dysfunction), right 01/17/2022   Generalized anxiety disorder    [place] measles as a child   Hemoptysis 01/17/2022   Hyperglycemia 04/25/2020   Hyperlipidemia    Insomnia 02/06/2021   Internal hemorrhoids    Irregular cardiac rhythm 03/10/2018   Lipoma 08/04/2018   Macular degeneration    Major depressive disorder 08/01/2012   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   Nocturia 12/04/2022   Obstructive sleep apnea 11/09/2018   Patient reports mild symptoms; was not prescribed a CPAP machine   Otitis externa 08/10/2012   PAF (paroxysmal atrial fibrillation)    Palpitations 09/07/2018   Parkinson's disease (HCC) 05/19/2019   Recurrent falls 12/04/2022   Thrombocytopenia    TIA (transient ischemic attack) 03/10/2018   Unsteady gait 09/11/2021    Assessment: Patient Reported Symptoms:  Cognitive        Neurological      HEENT        Cardiovascular      Respiratory      Endocrine       Gastrointestinal        Genitourinary      Integumentary      Musculoskeletal          Psychosocial       Do you feel physically threatened by others?: No      12/05/2023    1:47 PM  Depression screen PHQ 2/9  Decreased Interest 3  Down, Depressed, Hopeless 1  PHQ - 2 Score 4  Altered sleeping 0  Tired, decreased energy 3  Change in appetite 0  Feeling bad or failure about yourself  2  Trouble concentrating 2  Moving slowly or fidgety/restless 3  Suicidal thoughts 0  PHQ-9 Score 14  Difficult doing work/chores Somewhat difficult    There were no vitals filed for this visit.  Medications Reviewed Today   Medications were not reviewed in this encounter     Recommendation:   No further recommendations. Pt spouse advise PCS services are already active in the home and family does not required further assistance.  Follow Up Plan:   Closing From:  Complex Care Management  Fletcher Humble MSW, LCSW Licensed Clinical Social Worker  [facility], Population Health Direct Dial: [phone]  Fax: [phone]

## 2024-02-03 ENCOUNTER — Encounter: Payer: Self-pay | Admitting: Family Medicine

## 2024-02-03 ENCOUNTER — Telehealth (INDEPENDENT_AMBULATORY_CARE_PROVIDER_SITE_OTHER): Admitting: Family Medicine

## 2024-02-03 DIAGNOSIS — R002 Palpitations: Secondary | ICD-10-CM

## 2024-02-03 DIAGNOSIS — E782 Mixed hyperlipidemia: Secondary | ICD-10-CM

## 2024-02-03 DIAGNOSIS — R739 Hyperglycemia, unspecified: Secondary | ICD-10-CM

## 2024-02-03 NOTE — Assessment & Plan Note (Signed)
 No recent flares

## 2024-02-03 NOTE — Assessment & Plan Note (Signed)
 [id number] acceptable, minimize simple carbs. Increase exercise as tolerated.

## 2024-02-03 NOTE — Progress Notes (Signed)
 MyChart Video Visit    Virtual Visit via Video Note   This patient is at least at moderate risk for complications without adequate follow up. This format is felt to be most appropriate for this patient at this time. Physical exam was limited by quality of the video and audio technology used for the visit. Porsha, CMA was able to get the patient set up on a video visit.  Patient location: Home Patient and provider in visit Provider location: Office  I discussed the limitations of evaluation and management by telemedicine and the availability of in person appointments. The patient expressed understanding and agreed to proceed.  Visit Date: 02/03/2024  Today's healthcare provider: Randie Bustle, MD     Subjective:    Patient ID: Walter Brown, male    DOB: 05-08-1941, 83 y.o.   MRN: [id number]  Chief Complaint  Patient presents with   Medical Management of Chronic Issues    Patient presents today for month follow-up.   Quality Metric Gaps    AWV    HPI Discussed the use of AI scribe software for clinical note transcription with the patient, who gave verbal consent to proceed.  History of Present Illness ANDREN WAITKUS is an 83 year old male with normal pressure hydrocephalus who presents with balance issues and dizziness.  He has ongoing balance issues and dizziness, which have remained stable without improvement. No headaches are present. He requires assistance for mobility, utilizing a walker or scooter, and has recently acquired a four-wheel scooter and a lift chair to aid in daily activities.  He is receiving physical therapy from [facility] and occupational therapy from [place]. Personal care services from an individual caregiver are also in place, which he finds beneficial. A nurse has reviewed his medications, although regular visits are not scheduled.  He has undergone one lumbar puncture without complications and is awaiting further consultation regarding potential  surgical interventions for continuous cerebrospinal fluid drainage. Considering a shunt placement.     Past Medical History:  Diagnosis Date   A-fib [facility])    Allergy    Anemia    mild   Arthritis 04/06/2017   Asthma    childhood   ASVD (arteriosclerotic vascular disease) 09/07/2018   Back pain with radiation 04/26/2013   Low back with LLE radiculopathy   Basal cell carcinoma    skin- on nose- basal cell (20 yrs ago) forehead 1 year ago     Sees Dr Thais Fill of dermatology   BPH (benign prostatic hyperplasia)    Bradycardia 06/28/2018   Cataract 10/07/2014   Cerumen impaction 08/10/2012   Chicken pox as child   Coronary artery calcification 11/23/2019   Dysphagia 09/11/2021   Elevated LFTs    Essential hypertension    ETD (Eustachian tube dysfunction), right 01/17/2022   Generalized anxiety disorder    [place] measles as a child   Hemoptysis 01/17/2022   Hyperglycemia 04/25/2020   Hyperlipidemia    Insomnia 02/06/2021   Internal hemorrhoids    Irregular cardiac rhythm 03/10/2018   Lipoma 08/04/2018   Macular degeneration    Major depressive disorder 08/01/2012   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   Nocturia 12/04/2022   Obstructive sleep apnea 11/09/2018   Patient reports mild symptoms; was not prescribed a CPAP machine   Otitis externa 08/10/2012   PAF (paroxysmal atrial fibrillation)    Palpitations 09/07/2018   Parkinson's disease (HCC) 05/19/2019   Recurrent falls 12/04/2022   Thrombocytopenia  TIA (transient ischemic attack) 03/10/2018   Unsteady gait 09/11/2021    Past Surgical History:  Procedure Laterality Date   BELPHAROPTOSIS REPAIR     very young, b/l   EXCISIONAL HEMORRHOIDECTOMY     EYE SURGERY  2017   eyelids   HYDROCELE EXCISION / REPAIR  2012   b/l   SKIN CANCER EXCISION     nose and forehead, basal cell CA   UPPER GASTROINTESTINAL ENDOSCOPY      Family History  Problem Relation Age of Onset    Hypertension Mother    Other Mother        MRSA   Heart disease Mother        stents   ADD / ADHD Mother    Arthritis Mother    Asthma Mother    Depression Mother    Vision loss Mother    Heart disease Father    Hypertension Father    Prostate cancer Father 54   Parkinson's disease Father    Cancer Father 52   Hearing loss Father    Stroke Father    Vision loss Father    Varicose Veins Father    Stroke Maternal Grandmother    Prostate cancer Maternal Grandfather    Asthma Maternal Grandfather    Cancer Maternal Grandfather    Cancer Paternal Grandfather        EYE   Hearing loss Paternal Grandfather    ADD / ADHD Son        ADHD   Other Son 24       part of 1 lung removed- due to infection   Alcohol abuse Son    Depression Son    Alcohol abuse Son    Alcohol abuse Maternal Uncle    Depression Maternal Uncle    Asthma Maternal Uncle    Heart disease Paternal Uncle    Colon cancer Neg Hx    Esophageal cancer Neg Hx    Rectal cancer Neg Hx    Stomach cancer Neg Hx     Social History   Socioeconomic History   Marital status: Married    Spouse name: Not on file   Number of children: 2   Years of education: 16   Highest education level: Bachelor's degree (e.g., BA, AB, BS)  Occupational History   Occupation: Retired  Tobacco Use   Smoking status: Former    Current packs/day: 0.00    Average packs/day: 1.5 packs/day for 15.0 years (22.5 ttl pk-yrs)    Types: Cigarettes    Start date: 10/06/1978    Quit date: 10/07/1978    Years since quitting: 45.3   Smokeless tobacco: Never   Tobacco comments:    QUIT 1980  Vaping Use   Vaping status: Never Used  Substance and Sexual Activity   Alcohol use: Yes    Alcohol/week: 7.0 standard drinks of alcohol    Types: 7 Glasses of wine per week    Comment: wine with meals   Drug use: Not Currently   Sexual activity: Yes    Comment: lives with wife, retired from Chief Strategy Officer in [employer] , no major  dietary restructions.  Other Topics Concern   Not on file  Social History Narrative   Right handed    Occasionally caffeine   Retired   Lives with wife   Social Drivers of Corporate investment banker Strain: Low Risk  (03/26/2022)   Overall Financial Resource Strain (CARDIA)    Difficulty of Paying  Living Expenses: Not hard at all  Food Insecurity: No Food Insecurity (01/22/2024)   Hunger Vital Sign    Worried About Running Out of Food in the Last Year: Never true    Ran Out of Food in the Last Year: Never true  Transportation Needs: No Transportation Needs (01/22/2024)   PRAPARE - Administrator, Civil Service (Medical): No    Lack of Transportation (Non-Medical): No  Physical Activity: Sufficiently Active (03/26/2022)   Exercise Vital Sign    Days of Exercise per Week: 5 days    Minutes of Exercise per Session: 50 min  Stress: Stress Concern Present (03/26/2022)   [employer] of Occupational Health - Occupational Stress Questionnaire    Feeling of Stress : To some extent  Social Connections: Moderately Isolated (03/26/2022)   Social Connection and Isolation Panel [NHANES]    Frequency of Communication with Friends and Family: More than three times a week    Frequency of Social Gatherings with Friends and Family: Once a week    Attends Religious Services: Never    Database administrator or Organizations: No    Attends Banker Meetings: Never    Marital Status: Married  Catering manager Violence: Not At Risk (01/22/2024)   Humiliation, Afraid, Rape, and Kick questionnaire    Fear of Current or Ex-Partner: No    Emotionally Abused: No    Physically Abused: No    Sexually Abused: No    Outpatient Medications Prior to Visit  Medication Sig Dispense Refill   acetaminophen  (TYLENOL ) 500 MG tablet Take 2 tablets (1,000 mg total) by mouth every 6 (six) hours as needed for moderate pain (pain score 4-6). 90 tablet 0   amiodarone  (PACERONE ) 200 MG tablet  TAKE 1/2 TABLET BY MOUTH DAILY 45 tablet 3   bacitracin  ointment Apply 1 Application topically 2 (two) times daily. 120 g 0   benzonatate  (TESSALON ) 100 MG capsule TAKE 1-2 CAPSULES (100-200 MG TOTAL) BY MOUTH 2 (TWO) TIMES DAILY AS NEEDED FOR COUGH. 40 capsule 1   budesonide -formoterol  (SYMBICORT ) 160-4.5 MCG/ACT inhaler Inhale 2 puffs into the lungs 2 (two) times daily. 1 each 3   calcium -vitamin D (OSCAL WITH D) 500-5 MG-MCG tablet Take 1 tablet by mouth.     carbidopa -levodopa  (SINEMET  CR) 50-200 MG tablet TAKE 1 TABLET BY MOUTH EVERYDAY AT BEDTIME 90 tablet 0   carbidopa -levodopa  (SINEMET  IR) 25-100 MG tablet 2 AT 7AM, 2 AT 11AM, Take  1 AT 3PM, Take 1 at 7 PM 540 tablet 0   divalproex  (DEPAKOTE ) 125 MG DR tablet TAKE 1 TABLET (125 MG TOTAL) BY MOUTH AT BEDTIME 90 tablet 3   ezetimibe  (ZETIA ) 10 MG tablet TAKE 1 TABLET BY MOUTH EVERY DAY 90 tablet 3   guaifenesin  (ROBITUSSIN) 100 MG/5ML syrup Take 10 mLs (200 mg total) by mouth 3 (three) times daily as needed for cough.     hydrochlorothiazide  (HYDRODIURIL ) 25 MG tablet TAKE 1 TABLET BY MOUTH EVERY DAY 90 tablet 1   levalbuterol  (XOPENEX  HFA) 45 MCG/ACT inhaler INHALE 1 TO 2 PUFFS BY MOUTH EVERY 6 HOURS AS NEEDED FOR WHEEZE 15 each 1   Melatonin 10 MG TABS Take 10 mg by mouth daily as needed.     mirabegron ER (MYRBETRIQ) 50 MG TB24 tablet Take 50 mg by mouth daily.     Multiple Vitamins-Minerals (ICAPS AREDS 2 PO) Take 1 capsule by mouth in the morning and at bedtime.     pantoprazole  (PROTONIX ) 40 MG  tablet Take 1 tablet (40 mg total) by mouth daily as needed.     Semaglutide -Weight Management (WEGOVY ) 0.25 MG/0.5ML SOAJ Inject 0.25 mg into the skin once a week. 2 mL 1   sertraline  (ZOLOFT ) 100 MG tablet TAKE 1 TABLET BY MOUTH EVERY DAY 90 tablet 1   solifenacin (VESICARE) 5 MG tablet Take 5 mg by mouth daily.     tamsulosin  (FLOMAX ) 0.4 MG CAPS capsule Take 1 capsule (0.4 mg total) by mouth daily.     No facility-administered medications  prior to visit.    Allergies  Allergen Reactions   Albuterol  Sulfate Palpitations   Azithromycin Other (See Comments)    Hepatotoxicity   Doxycycline Hyclate Other (See Comments)    Taken with Azithromycin and had Heaptotoxicity    Review of Systems  Constitutional:  Positive for malaise/fatigue. Negative for fever.  HENT:  Negative for congestion.   Eyes:  Negative for blurred vision.  Respiratory:  Negative for shortness of breath.   Cardiovascular:  Negative for chest pain, palpitations and leg swelling.  Gastrointestinal:  Negative for abdominal pain, blood in stool and nausea.  Genitourinary:  Negative for dysuria and frequency.  Musculoskeletal:  Negative for falls.  Skin:  Negative for rash.  Neurological:  Positive for dizziness and weakness. Negative for loss of consciousness and headaches.  Endo/Heme/Allergies:  Negative for environmental allergies.  Psychiatric/Behavioral:  Negative for depression. The patient is not nervous/anxious.        Objective:    Physical Exam Constitutional:      General: He is not in acute distress.    Appearance: Normal appearance. He is not ill-appearing or toxic-appearing.  HENT:     Head: Normocephalic and atraumatic.     Right Ear: External ear normal.     Left Ear: External ear normal.     Nose: Nose normal.  Eyes:     General:        Right eye: No discharge.        Left eye: No discharge.  Pulmonary:     Effort: Pulmonary effort is normal.  Skin:    Findings: No rash.  Neurological:     Mental Status: He is alert and oriented to person, place, and time.  Psychiatric:        Behavior: Behavior normal.     There were no vitals taken for this visit. Wt Readings from Last 3 Encounters:  01/16/24 179 lb (81.2 kg)  01/08/24 178 lb (80.7 kg)  12/23/23 180 lb (81.6 kg)       Assessment & Plan:  Hyperglycemia Assessment & Plan: hgba1c acceptable, minimize simple carbs. Increase exercise as tolerated.   Orders: -      Comprehensive metabolic panel with GFR; Future -     Hemoglobin A1c; Future  Mixed hyperlipidemia Assessment & Plan: Tolerating statin, encouraged heart healthy diet, avoid trans fats, minimize simple carbs and saturated fats. Increase exercise as tolerated  Orders: -     Lipid panel; Future  Palpitations Assessment & Plan: No recent flares  Orders: -     CBC with Differential/Platelet; Future -     TSH; Future     Assessment and Plan Assessment & Plan Normal pressure hydrocephalus Symptoms persist with dizziness and balance issues. No headaches. Evaluation for shunt placement ongoing. Awaiting neurosurgical consultation with Dr. Larrie Po. Dr. Hattie Lints recommends considering shunt placement. - Consult with Dr. Larrie Po regarding shunt placement for continuous fluid drainage.  Recurrent falls Balance and dizziness issues require mobility  assistance. Uses walker, scooter, and recently acquired four-wheel scooter and lift chair. Personal care services assist with daily activities. - Continue using mobility aids such as walker and scooter. - Ensure proper assembly and use of the four-wheel scooter and lift chair. - Maintain personal care services for assistance with daily activities.  Parkinson's disease with mild neurocognitive disorder Condition well-managed with ongoing treatment. Undergoing lumbar punctures. Discussion of shunt placement for hydrocephalus, not deep brain stimulation. - Continue current management plan for Parkinson's disease. - Follow up on lumbar puncture results and any further recommendations from Dr. Hattie Lints.     I discussed the assessment and treatment plan with the patient. The patient was provided an opportunity to ask questions and all were answered. The patient agreed with the plan and demonstrated an understanding of the instructions.   The patient was advised to call back or seek an in-person evaluation if the symptoms worsen or if the condition fails to  improve as anticipated.  Randie Bustle, MD [facility] Primary [facility] at [facility] [phone] (phone) [phone] (fax)  [facility]

## 2024-02-03 NOTE — Assessment & Plan Note (Signed)
 Tolerating statin, encouraged heart healthy diet, avoid trans fats, minimize simple carbs and saturated fats. Increase exercise as tolerated

## 2024-02-04 ENCOUNTER — Other Ambulatory Visit (INDEPENDENT_AMBULATORY_CARE_PROVIDER_SITE_OTHER)

## 2024-02-04 ENCOUNTER — Encounter: Payer: Self-pay | Admitting: Family Medicine

## 2024-02-04 DIAGNOSIS — E782 Mixed hyperlipidemia: Secondary | ICD-10-CM

## 2024-02-04 DIAGNOSIS — I480 Paroxysmal atrial fibrillation: Secondary | ICD-10-CM | POA: Diagnosis not present

## 2024-02-04 DIAGNOSIS — G919 Hydrocephalus, unspecified: Secondary | ICD-10-CM | POA: Diagnosis not present

## 2024-02-04 DIAGNOSIS — G20A2 Parkinson's disease without dyskinesia, with fluctuations: Secondary | ICD-10-CM | POA: Diagnosis not present

## 2024-02-04 DIAGNOSIS — R002 Palpitations: Secondary | ICD-10-CM

## 2024-02-04 DIAGNOSIS — Z9181 History of falling: Secondary | ICD-10-CM | POA: Diagnosis not present

## 2024-02-04 DIAGNOSIS — I10 Essential (primary) hypertension: Secondary | ICD-10-CM | POA: Diagnosis not present

## 2024-02-04 DIAGNOSIS — R739 Hyperglycemia, unspecified: Secondary | ICD-10-CM

## 2024-02-04 DIAGNOSIS — G3184 Mild cognitive impairment, so stated: Secondary | ICD-10-CM | POA: Diagnosis not present

## 2024-02-04 DIAGNOSIS — Z7951 Long term (current) use of inhaled steroids: Secondary | ICD-10-CM | POA: Diagnosis not present

## 2024-02-04 LAB — COMPREHENSIVE METABOLIC PANEL WITH GFR
ALT: 5 U/L (ref 0–53)
AST: 15 U/L (ref 0–37)
Albumin: 4.3 g/dL (ref 3.5–5.2)
Alkaline Phosphatase: 56 U/L (ref 39–117)
BUN: 17 mg/dL (ref 6–23)
CO2: 28 meq/L (ref 19–32)
Calcium: 9.5 mg/dL (ref 8.4–10.5)
Chloride: 106 meq/L (ref 96–112)
Creatinine, Ser: 0.92 mg/dL (ref 0.40–1.50)
GFR: 77.3 mL/min (ref >60.00)
Glucose, Bld: 92 mg/dL (ref 70–99)
Potassium: 3.7 meq/L (ref 3.5–5.1)
Sodium: 142 meq/L (ref 135–145)
Total Bilirubin: 0.8 mg/dL (ref 0.2–1.2)
Total Protein: 6.4 g/dL (ref 6.0–8.3)

## 2024-02-04 LAB — CBC WITH DIFFERENTIAL/PLATELET
Basophils Absolute: 0.1 10*3/uL (ref 0.0–0.1)
Basophils Relative: 1.2 % (ref 0.0–3.0)
Eosinophils Absolute: 0.1 10*3/uL (ref 0.0–0.7)
Eosinophils Relative: 1.5 % (ref 0.0–5.0)
HCT: 43.2 % (ref 39.0–52.0)
Hemoglobin: 14.4 g/dL (ref 13.0–17.0)
Lymphocytes Relative: 14.3 % (ref 12.0–46.0)
Lymphs Abs: 1.1 10*3/uL (ref 0.7–4.0)
MCHC: 33.2 g/dL (ref 30.0–36.0)
MCV: 96.1 fl (ref 78.0–100.0)
Monocytes Absolute: 0.8 10*3/uL (ref 0.1–1.0)
Monocytes Relative: 10.6 % (ref 3.0–12.0)
Neutro Abs: 5.7 10*3/uL (ref 1.4–7.7)
Neutrophils Relative %: 72.4 % (ref 43.0–77.0)
Platelets: 175 10*3/uL (ref 150.0–400.0)
RBC: 4.5 Mil/uL (ref 4.22–5.81)
RDW: 14.2 % (ref 11.5–15.5)
WBC: 7.9 10*3/uL (ref 4.0–10.5)

## 2024-02-04 LAB — TSH: TSH: 1.74 u[IU]/mL (ref 0.35–5.50)

## 2024-02-04 LAB — HEMOGLOBIN A1C: Hgb A1c MFr Bld: 5.2 % (ref 4.6–6.5)

## 2024-02-04 LAB — LIPID PANEL
Cholesterol: 166 mg/dL (ref 0–200)
HDL: 69.3 mg/dL (ref >39.00)
LDL Cholesterol: 79 mg/dL (ref 0–99)
NonHDL: 97.19
Total CHOL/HDL Ratio: 2
Triglycerides: 92 mg/dL (ref 0.0–149.0)
VLDL: 18.4 mg/dL (ref 0.0–40.0)

## 2024-02-05 ENCOUNTER — Telehealth: Payer: Self-pay | Admitting: Neurology

## 2024-02-05 NOTE — Telephone Encounter (Signed)
 Called patients wife I received a email from [facility] that the insurance is requiring a formal investigation and it could take up to 10-14 days to get those resulted. I do not want patient to received a large copay so waiting to hear back from [facility]

## 2024-02-05 NOTE — Telephone Encounter (Signed)
 Caller left message with AN. [employer] insurance has approved the biopsy

## 2024-02-06 ENCOUNTER — Other Ambulatory Visit: Payer: Self-pay

## 2024-02-06 NOTE — Patient Instructions (Signed)
 Visit Information  Thank you for taking time to visit with me today. Please don't hesitate to contact me if I can be of assistance to you before our next scheduled appointment.  Our next appointment is by telephone on 03/12/24 at 3:00 Please call the care guide team at [phone] if you need to cancel or reschedule your appointment.   Following is a copy of your care plan:   Goals Addressed             This Visit's Progress    VBCI RN Care Plan       Problems:  Chronic Disease Management support and education needs related to Parkinsons Disease  Goal: Over the next 30 days the Patient will attend all scheduled medical appointments: with PCP and Neurology as evidenced by chart review and patient report        continue to work with RN Care Manager and/or Social Worker to address care management and care coordination needs related to Parkinsons Disease as evidenced by adherence to care management team scheduled appointments     take all medications exactly as prescribed and will call provider for medication related questions as evidenced by chart review and patient report    verbalize basic understanding of Parkinsons Disease disease process and self health management plan as evidenced by patient asking questions and engaging in discussion with providers  Interventions:   Evaluation of current treatment plan related to  Parkinsons Disease , Transportation, Level of care concerns, and Inability to perform ADL's independently self-management and patient's adherence to plan as established by provider. Discussed plans with patient for ongoing care management follow up and provided patient with direct contact information for care management team Reviewed medications with patient and discussed future caregiving needs   Patient Self-Care Activities:  Attend all scheduled provider appointments Call pharmacy for medication refills 3-7 days in advance of running out of medications Call  provider office for new concerns or questions  Take medications as prescribed    Plan:  Telephone follow up appointment with care management team member scheduled for:  06/ 06/25 at 3:00             Please call the Suicide and Crisis Lifeline: 988 call the [place]  National Suicide Prevention Lifeline: (873)091-5693 or TTY: 518-798-2006 TTY 385 159 4748) to talk to a trained counselor call 1-800-273-TALK (toll free, 24 hour hotline) if you are experiencing a Mental Health or Behavioral Health Crisis or need someone to talk to.  Patient verbalizes understanding of instructions and care plan provided today and agrees to view in MyChart. Active MyChart status and patient understanding of how to access instructions and care plan via MyChart confirmed with patient.     Clarnce Crow BSN RN CCM [place]  [facility], [facility] RN Care Manager Direct Dial: [phone] Fax: [phone]

## 2024-02-06 NOTE — Patient Outreach (Signed)
 Complex Care Management   Visit Note  02/06/2024  Name:  Walter Brown MRN: [id number] DOB: Mar 13, 1941  Situation: Referral received for Complex Care Management related to  Parkinsons Disease.  I obtained verbal consent from Patient.  Visit completed with patient and patient spouse   on the phone  Background:   Past Medical History:  Diagnosis Date   A-fib [facility])    Allergy    Anemia    mild   Arthritis 04/06/2017   Asthma    childhood   ASVD (arteriosclerotic vascular disease) 09/07/2018   Back pain with radiation 04/26/2013   Low back with LLE radiculopathy   Basal cell carcinoma    skin- on nose- basal cell (20 yrs ago) forehead 1 year ago     Sees Dr Thais Fill of dermatology   BPH (benign prostatic hyperplasia)    Bradycardia 06/28/2018   Cataract 10/07/2014   Cerumen impaction 08/10/2012   Chicken pox as child   Coronary artery calcification 11/23/2019   Dysphagia 09/11/2021   Elevated LFTs    Essential hypertension    ETD (Eustachian tube dysfunction), right 01/17/2022   Generalized anxiety disorder    [place] measles as a child   Hemoptysis 01/17/2022   Hyperglycemia 04/25/2020   Hyperlipidemia    Insomnia 02/06/2021   Internal hemorrhoids    Irregular cardiac rhythm 03/10/2018   Lipoma 08/04/2018   Macular degeneration    Major depressive disorder 08/01/2012   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   Nocturia 12/04/2022   Obstructive sleep apnea 11/09/2018   Patient reports mild symptoms; was not prescribed a CPAP machine   Otitis externa 08/10/2012   PAF (paroxysmal atrial fibrillation)    Palpitations 09/07/2018   Parkinson's disease (HCC) 05/19/2019   Recurrent falls 12/04/2022   Thrombocytopenia    TIA (transient ischemic attack) 03/10/2018   Unsteady gait 09/11/2021    Assessment: Patient Reported Symptoms:  Cognitive Cognitive Status: Alert and oriented to person, place, and time      Neurological  Neurological Review of Symptoms: Dizziness, Weakness (currently treated by Neurology) Neurological Conditions: Parkinson's disease Neurological Management Strategies: Medication therapy, Routine screening, Exercise (patient receiving HH physical therapy which patient reports is helping) Neurological Self-Management Outcome: 3 (uncertain)  HEENT HEENT Symptoms Reported: Change or loss of hearing (bilateral hearing aids) HEENT Conditions: Ear problem(s) HEENT Management Strategies: Medical device HEENT Self-Management Outcome: 4 (good) Ear problem(s)  Cardiovascular Cardiovascular Symptoms Reported: No symptoms reported (patient with Afib, no symptoms reported) Cardiovascular Conditions: Dysrhythmia Cardiovascular Management Strategies: Medication therapy, Routine screening  Respiratory Respiratory Symptoms Reported: No symptoms reported    Endocrine Patient reports the following symptoms related to hypoglycemia or hyperglycemia :  (5.2) Is patient diabetic?: No (last A1C 5.2) Is patient checking blood sugars at home?: No    Gastrointestinal Gastrointestinal Symptoms Reported: Incontinence, No symptoms reported Gastrointestinal Conditions: Fecal incontinence Gastrointestinal Management Strategies: Incontinence garment/pad Gastrointestinal Self-Management Outcome: 4 (good) Nutrition Risk Screen (CP): No indicators present  Genitourinary Genitourinary Symptoms Reported: Frequency, Incontinence Genitourinary Conditions:  (BPH) Genitourinary Management Strategies: Incontinence garment/pad Genitourinary Self-Management Outcome: 3 (uncertain)  Integumentary Integumentary Symptoms Reported: No symptoms reported    Musculoskeletal Musculoskelatal Symptoms Reviewed: No symptoms reported (symptoms unchanged from last assessment) Musculoskeletal Management Strategies: Coping strategies, Adequate rest, Exercise, Routine screening, Medication therapy (receiving PT at home for improving gait and  strength) Musculoskeletal Self-Management Outcome: 3 (uncertain) Falls in the past year?: No (no falls since 4/20 report from [facility] RN) Patient at  Risk for Falls Due to: History of fall(s), Impaired balance/gait, Impaired mobility Fall risk Follow up: Falls evaluation completed, Education provided, Falls prevention discussed  Psychosocial Psychosocial Symptoms Reported: No symptoms reported            12/05/2023    1:47 PM  Depression screen PHQ 2/9  Decreased Interest 3  Down, Depressed, Hopeless 1  PHQ - 2 Score 4  Altered sleeping 0  Tired, decreased energy 3  Change in appetite 0  Feeling bad or failure about yourself  2  Trouble concentrating 2  Moving slowly or fidgety/restless 3  Suicidal thoughts 0  PHQ-9 Score 14  Difficult doing work/chores Somewhat difficult    There were no vitals filed for this visit.  Medications Reviewed Today   Medications were not reviewed in this encounter     Recommendation:   PCP Follow-up  Follow Up Plan:   Telephone follow up appointment date/time:  03/12/24 mat 3:00   Clarnce Crow BSN RN CCM [place]  [facility], [facility] RN Care Manager Direct Dial: [phone] Fax: [phone]

## 2024-02-10 DIAGNOSIS — I480 Paroxysmal atrial fibrillation: Secondary | ICD-10-CM | POA: Diagnosis not present

## 2024-02-10 DIAGNOSIS — G3184 Mild cognitive impairment, so stated: Secondary | ICD-10-CM | POA: Diagnosis not present

## 2024-02-10 DIAGNOSIS — Z7951 Long term (current) use of inhaled steroids: Secondary | ICD-10-CM | POA: Diagnosis not present

## 2024-02-10 DIAGNOSIS — Z9181 History of falling: Secondary | ICD-10-CM | POA: Diagnosis not present

## 2024-02-10 DIAGNOSIS — G20A2 Parkinson's disease without dyskinesia, with fluctuations: Secondary | ICD-10-CM | POA: Diagnosis not present

## 2024-02-10 DIAGNOSIS — I10 Essential (primary) hypertension: Secondary | ICD-10-CM | POA: Diagnosis not present

## 2024-02-10 DIAGNOSIS — G919 Hydrocephalus, unspecified: Secondary | ICD-10-CM | POA: Diagnosis not present

## 2024-02-12 DIAGNOSIS — I480 Paroxysmal atrial fibrillation: Secondary | ICD-10-CM | POA: Diagnosis not present

## 2024-02-12 DIAGNOSIS — G3184 Mild cognitive impairment, so stated: Secondary | ICD-10-CM | POA: Diagnosis not present

## 2024-02-12 DIAGNOSIS — Z7951 Long term (current) use of inhaled steroids: Secondary | ICD-10-CM | POA: Diagnosis not present

## 2024-02-12 DIAGNOSIS — G919 Hydrocephalus, unspecified: Secondary | ICD-10-CM | POA: Diagnosis not present

## 2024-02-12 DIAGNOSIS — Z9181 History of falling: Secondary | ICD-10-CM | POA: Diagnosis not present

## 2024-02-12 DIAGNOSIS — I10 Essential (primary) hypertension: Secondary | ICD-10-CM | POA: Diagnosis not present

## 2024-02-12 DIAGNOSIS — G20A2 Parkinson's disease without dyskinesia, with fluctuations: Secondary | ICD-10-CM | POA: Diagnosis not present

## 2024-02-13 ENCOUNTER — Telehealth: Payer: Self-pay | Admitting: Neurology

## 2024-02-13 NOTE — Telephone Encounter (Signed)
 Left message for patient to call back to schedule a skin biopsy with Dr Tat. It would be a 60 mins on a botox Friday

## 2024-02-16 DIAGNOSIS — I480 Paroxysmal atrial fibrillation: Secondary | ICD-10-CM | POA: Diagnosis not present

## 2024-02-16 DIAGNOSIS — Z9181 History of falling: Secondary | ICD-10-CM | POA: Diagnosis not present

## 2024-02-16 DIAGNOSIS — G919 Hydrocephalus, unspecified: Secondary | ICD-10-CM | POA: Diagnosis not present

## 2024-02-16 DIAGNOSIS — I10 Essential (primary) hypertension: Secondary | ICD-10-CM | POA: Diagnosis not present

## 2024-02-16 DIAGNOSIS — G20A2 Parkinson's disease without dyskinesia, with fluctuations: Secondary | ICD-10-CM | POA: Diagnosis not present

## 2024-02-16 DIAGNOSIS — Z7951 Long term (current) use of inhaled steroids: Secondary | ICD-10-CM | POA: Diagnosis not present

## 2024-02-16 DIAGNOSIS — G3184 Mild cognitive impairment, so stated: Secondary | ICD-10-CM | POA: Diagnosis not present

## 2024-02-17 DIAGNOSIS — N319 Neuromuscular dysfunction of bladder, unspecified: Secondary | ICD-10-CM | POA: Diagnosis not present

## 2024-02-17 DIAGNOSIS — R3915 Urgency of urination: Secondary | ICD-10-CM | POA: Diagnosis not present

## 2024-02-18 ENCOUNTER — Encounter: Payer: Self-pay | Admitting: Neurology

## 2024-02-19 DIAGNOSIS — Z7951 Long term (current) use of inhaled steroids: Secondary | ICD-10-CM | POA: Diagnosis not present

## 2024-02-19 DIAGNOSIS — I480 Paroxysmal atrial fibrillation: Secondary | ICD-10-CM | POA: Diagnosis not present

## 2024-02-19 DIAGNOSIS — Z9181 History of falling: Secondary | ICD-10-CM | POA: Diagnosis not present

## 2024-02-19 DIAGNOSIS — I10 Essential (primary) hypertension: Secondary | ICD-10-CM | POA: Diagnosis not present

## 2024-02-19 DIAGNOSIS — G20A2 Parkinson's disease without dyskinesia, with fluctuations: Secondary | ICD-10-CM | POA: Diagnosis not present

## 2024-02-19 DIAGNOSIS — G919 Hydrocephalus, unspecified: Secondary | ICD-10-CM | POA: Diagnosis not present

## 2024-02-19 DIAGNOSIS — G3184 Mild cognitive impairment, so stated: Secondary | ICD-10-CM | POA: Diagnosis not present

## 2024-02-20 ENCOUNTER — Telehealth: Payer: Self-pay | Admitting: Family Medicine

## 2024-02-20 NOTE — Telephone Encounter (Signed)
 Copied from CRM [id number]. Topic: Referral - Question >> Feb 20, 2024 10:11 AM Magdalene [employer] wrote: Reason for CRM: [name], Anastacio Balm is requesting to extend physical therapy and occupational therapy because it has been helping a lot.

## 2024-02-23 DIAGNOSIS — G919 Hydrocephalus, unspecified: Secondary | ICD-10-CM | POA: Diagnosis not present

## 2024-02-23 DIAGNOSIS — I10 Essential (primary) hypertension: Secondary | ICD-10-CM | POA: Diagnosis not present

## 2024-02-23 DIAGNOSIS — Z7951 Long term (current) use of inhaled steroids: Secondary | ICD-10-CM | POA: Diagnosis not present

## 2024-02-23 DIAGNOSIS — G20A2 Parkinson's disease without dyskinesia, with fluctuations: Secondary | ICD-10-CM | POA: Diagnosis not present

## 2024-02-23 DIAGNOSIS — G3184 Mild cognitive impairment, so stated: Secondary | ICD-10-CM | POA: Diagnosis not present

## 2024-02-23 DIAGNOSIS — I480 Paroxysmal atrial fibrillation: Secondary | ICD-10-CM | POA: Diagnosis not present

## 2024-02-23 DIAGNOSIS — Z9181 History of falling: Secondary | ICD-10-CM | POA: Diagnosis not present

## 2024-02-23 NOTE — Telephone Encounter (Signed)
 Called patient's wife and no answer left vm to return call.

## 2024-02-24 ENCOUNTER — Telehealth: Payer: Self-pay | Admitting: Family Medicine

## 2024-02-24 NOTE — Telephone Encounter (Signed)
 Called patient's wife and no answer and couldn't leave a message to return call.

## 2024-02-24 NOTE — Telephone Encounter (Signed)
 Copied from CRM [id number]. Topic: Referral - Question >> Feb 20, 2024 10:11 AM Magdalene [employer] wrote: Reason for CRM: [name],Claire is requesting to extend physical therapy and occupational therapy because it has been helping a lot. >> Feb 24, 2024  2:20 PM Caliyah H wrote: Patient  wife is returning missed calls from the nurse from yesterday and today regarding the extension of occupational and physical therapy.        Returned patients call and no answer left vm to return call.

## 2024-02-24 NOTE — Telephone Encounter (Signed)
 Copied from CRM [id number]. Topic: Referral - Question >> Feb 20, 2024 10:11 AM Magdalene [employer] wrote: Reason for CRM: [name], Anastacio Balm is requesting to extend physical therapy and occupational therapy because it has been helping a lot. >> Feb 24, 2024  2:20 PM Caliyah H wrote: Patient  wife is returning missed calls from the nurse from yesterday and today regarding the extension of occupational and physical therapy.

## 2024-02-24 NOTE — Telephone Encounter (Signed)
 [name] returned call and wanted Dr. Rodrick Clapper to reach out to [facility] to see about order extension  for PT and OT.  Called [place] and the office stated that he has orders coming in the fax soon for his PT to be extended but OT will not due to he reach his goals for OT.  [name] was advised that PT orders will get extended but not OT orders and she verbalized understanding.

## 2024-03-01 ENCOUNTER — Other Ambulatory Visit: Payer: Self-pay

## 2024-03-01 ENCOUNTER — Encounter (HOSPITAL_COMMUNITY): Payer: Self-pay | Admitting: Emergency Medicine

## 2024-03-01 ENCOUNTER — Emergency Department (HOSPITAL_COMMUNITY)

## 2024-03-01 ENCOUNTER — Inpatient Hospital Stay (HOSPITAL_COMMUNITY)
Admission: EM | Admit: 2024-03-01 | Discharge: 2024-03-04 | DRG: 871 | Disposition: A | Discharge status: Home Health | Attending: Internal Medicine | Admitting: Internal Medicine

## 2024-03-01 DIAGNOSIS — I480 Paroxysmal atrial fibrillation: Secondary | ICD-10-CM | POA: Diagnosis present

## 2024-03-01 DIAGNOSIS — Z881 Allergy status to other antibiotic agents status: Secondary | ICD-10-CM

## 2024-03-01 DIAGNOSIS — N401 Enlarged prostate with lower urinary tract symptoms: Secondary | ICD-10-CM | POA: Diagnosis not present

## 2024-03-01 DIAGNOSIS — E872 Acidosis, unspecified: Secondary | ICD-10-CM | POA: Diagnosis present

## 2024-03-01 DIAGNOSIS — Z822 Family history of deafness and hearing loss: Secondary | ICD-10-CM

## 2024-03-01 DIAGNOSIS — Z87891 Personal history of nicotine dependence: Secondary | ICD-10-CM | POA: Diagnosis not present

## 2024-03-01 DIAGNOSIS — Z7901 Long term (current) use of anticoagulants: Secondary | ICD-10-CM

## 2024-03-01 DIAGNOSIS — R0989 Other specified symptoms and signs involving the circulatory and respiratory systems: Secondary | ICD-10-CM | POA: Diagnosis not present

## 2024-03-01 DIAGNOSIS — R296 Repeated falls: Secondary | ICD-10-CM | POA: Diagnosis not present

## 2024-03-01 DIAGNOSIS — Z821 Family history of blindness and visual loss: Secondary | ICD-10-CM

## 2024-03-01 DIAGNOSIS — Z79899 Other long term (current) drug therapy: Secondary | ICD-10-CM

## 2024-03-01 DIAGNOSIS — Z85828 Personal history of other malignant neoplasm of skin: Secondary | ICD-10-CM

## 2024-03-01 DIAGNOSIS — Z8673 Personal history of transient ischemic attack (TIA), and cerebral infarction without residual deficits: Secondary | ICD-10-CM

## 2024-03-01 DIAGNOSIS — E785 Hyperlipidemia, unspecified: Secondary | ICD-10-CM | POA: Diagnosis present

## 2024-03-01 DIAGNOSIS — R0689 Other abnormalities of breathing: Secondary | ICD-10-CM | POA: Diagnosis not present

## 2024-03-01 DIAGNOSIS — I959 Hypotension, unspecified: Secondary | ICD-10-CM | POA: Diagnosis not present

## 2024-03-01 DIAGNOSIS — Z7985 Long-term (current) use of injectable non-insulin antidiabetic drugs: Secondary | ICD-10-CM

## 2024-03-01 DIAGNOSIS — G9341 Metabolic encephalopathy: Secondary | ICD-10-CM

## 2024-03-01 DIAGNOSIS — Z1152 Encounter for screening for COVID-19: Secondary | ICD-10-CM | POA: Diagnosis not present

## 2024-03-01 DIAGNOSIS — R2681 Unsteadiness on feet: Secondary | ICD-10-CM

## 2024-03-01 DIAGNOSIS — G4489 Other headache syndrome: Secondary | ICD-10-CM | POA: Diagnosis not present

## 2024-03-01 DIAGNOSIS — I6782 Cerebral ischemia: Secondary | ICD-10-CM | POA: Diagnosis not present

## 2024-03-01 DIAGNOSIS — Z7951 Long term (current) use of inhaled steroids: Secondary | ICD-10-CM

## 2024-03-01 DIAGNOSIS — G912 (Idiopathic) normal pressure hydrocephalus: Secondary | ICD-10-CM | POA: Diagnosis present

## 2024-03-01 DIAGNOSIS — F39 Unspecified mood [affective] disorder: Secondary | ICD-10-CM | POA: Diagnosis present

## 2024-03-01 DIAGNOSIS — I10 Essential (primary) hypertension: Secondary | ICD-10-CM | POA: Diagnosis present

## 2024-03-01 DIAGNOSIS — R6520 Severe sepsis without septic shock: Secondary | ICD-10-CM | POA: Insufficient documentation

## 2024-03-01 DIAGNOSIS — K219 Gastro-esophageal reflux disease without esophagitis: Secondary | ICD-10-CM | POA: Diagnosis present

## 2024-03-01 DIAGNOSIS — Z818 Family history of other mental and behavioral disorders: Secondary | ICD-10-CM

## 2024-03-01 DIAGNOSIS — R509 Fever, unspecified: Secondary | ICD-10-CM | POA: Diagnosis not present

## 2024-03-01 DIAGNOSIS — G9389 Other specified disorders of brain: Secondary | ICD-10-CM | POA: Diagnosis not present

## 2024-03-01 DIAGNOSIS — Z743 Need for continuous supervision: Secondary | ICD-10-CM | POA: Diagnosis not present

## 2024-03-01 DIAGNOSIS — R531 Weakness: Secondary | ICD-10-CM | POA: Diagnosis not present

## 2024-03-01 DIAGNOSIS — A419 Sepsis, unspecified organism: Principal | ICD-10-CM | POA: Diagnosis present

## 2024-03-01 DIAGNOSIS — D696 Thrombocytopenia, unspecified: Secondary | ICD-10-CM | POA: Diagnosis present

## 2024-03-01 DIAGNOSIS — E876 Hypokalemia: Secondary | ICD-10-CM | POA: Insufficient documentation

## 2024-03-01 DIAGNOSIS — Z9181 History of falling: Secondary | ICD-10-CM

## 2024-03-01 DIAGNOSIS — N400 Enlarged prostate without lower urinary tract symptoms: Secondary | ICD-10-CM | POA: Diagnosis present

## 2024-03-01 DIAGNOSIS — G20A1 Parkinson's disease without dyskinesia, without mention of fluctuations: Secondary | ICD-10-CM | POA: Diagnosis present

## 2024-03-01 DIAGNOSIS — Z823 Family history of stroke: Secondary | ICD-10-CM

## 2024-03-01 DIAGNOSIS — Z8042 Family history of malignant neoplasm of prostate: Secondary | ICD-10-CM

## 2024-03-01 DIAGNOSIS — N390 Urinary tract infection, site not specified: Secondary | ICD-10-CM | POA: Diagnosis present

## 2024-03-01 DIAGNOSIS — J4489 Other specified chronic obstructive pulmonary disease: Secondary | ICD-10-CM | POA: Diagnosis present

## 2024-03-01 DIAGNOSIS — Z888 Allergy status to other drugs, medicaments and biological substances status: Secondary | ICD-10-CM

## 2024-03-01 DIAGNOSIS — I2510 Atherosclerotic heart disease of native coronary artery without angina pectoris: Secondary | ICD-10-CM | POA: Diagnosis present

## 2024-03-01 DIAGNOSIS — Z820 Family history of epilepsy and other diseases of the nervous system: Secondary | ICD-10-CM

## 2024-03-01 DIAGNOSIS — Z8249 Family history of ischemic heart disease and other diseases of the circulatory system: Secondary | ICD-10-CM

## 2024-03-01 DIAGNOSIS — Z825 Family history of asthma and other chronic lower respiratory diseases: Secondary | ICD-10-CM

## 2024-03-01 DIAGNOSIS — R410 Disorientation, unspecified: Secondary | ICD-10-CM | POA: Diagnosis not present

## 2024-03-01 DIAGNOSIS — R059 Cough, unspecified: Secondary | ICD-10-CM | POA: Diagnosis not present

## 2024-03-01 DIAGNOSIS — Z8261 Family history of arthritis: Secondary | ICD-10-CM

## 2024-03-01 LAB — COMPREHENSIVE METABOLIC PANEL WITH GFR
ALT: 7 U/L (ref 0–44)
AST: 22 U/L (ref 15–41)
Albumin: 3.4 g/dL — ABNORMAL LOW (ref 3.5–5.0)
Alkaline Phosphatase: 44 U/L (ref 38–126)
Anion gap: 12 (ref 5–15)
BUN: 19 mg/dL (ref 8–23)
CO2: 25 mmol/L (ref 22–32)
Calcium: 9.3 mg/dL (ref 8.9–10.3)
Chloride: 101 mmol/L (ref 98–111)
Creatinine, Ser: 1.11 mg/dL (ref 0.61–1.24)
GFR, Estimated: >60 mL/min (ref >60)
Glucose, Bld: 143 mg/dL — ABNORMAL HIGH (ref 70–99)
Potassium: 3.4 mmol/L — ABNORMAL LOW (ref 3.5–5.1)
Sodium: 138 mmol/L (ref 135–145)
Total Bilirubin: 1 mg/dL (ref 0.0–1.2)
Total Protein: 6 g/dL — ABNORMAL LOW (ref 6.5–8.1)

## 2024-03-01 LAB — I-STAT CG4 LACTIC ACID, ED
Lactic Acid, Venous: 0.9 mmol/L (ref 0.5–1.9)
Lactic Acid, Venous: 2.4 mmol/L (ref 0.5–1.9)

## 2024-03-01 LAB — CBC WITH DIFFERENTIAL/PLATELET
Abs Immature Granulocytes: 0.11 10*3/uL — ABNORMAL HIGH (ref 0.00–0.07)
Basophils Absolute: 0.1 10*3/uL (ref 0.0–0.1)
Basophils Relative: 0 %
Eosinophils Absolute: 0.1 10*3/uL (ref 0.0–0.5)
Eosinophils Relative: 0 %
HCT: 37.4 % — ABNORMAL LOW (ref 39.0–52.0)
Hemoglobin: 12.6 g/dL — ABNORMAL LOW (ref 13.0–17.0)
Immature Granulocytes: 1 %
Lymphocytes Relative: 4 %
Lymphs Abs: 0.7 10*3/uL (ref 0.7–4.0)
MCH: 32.1 pg (ref 26.0–34.0)
MCHC: 33.7 g/dL (ref 30.0–36.0)
MCV: 95.4 fL (ref 80.0–100.0)
Monocytes Absolute: 2.2 10*3/uL — ABNORMAL HIGH (ref 0.1–1.0)
Monocytes Relative: 12 %
Neutro Abs: 15.7 10*3/uL — ABNORMAL HIGH (ref 1.7–7.7)
Neutrophils Relative %: 83 %
Platelets: 137 10*3/uL — ABNORMAL LOW (ref 150–400)
RBC: 3.92 MIL/uL — ABNORMAL LOW (ref 4.22–5.81)
RDW: 13.4 % (ref 11.5–15.5)
WBC: 18.8 10*3/uL — ABNORMAL HIGH (ref 4.0–10.5)
nRBC: 0 % (ref 0.0–0.2)

## 2024-03-01 LAB — RESP PANEL BY RT-PCR (RSV, FLU A&B, COVID)  RVPGX2
Influenza A by PCR: NEGATIVE
Influenza B by PCR: NEGATIVE
Resp Syncytial Virus by PCR: NEGATIVE
SARS Coronavirus 2 by RT PCR: NEGATIVE

## 2024-03-01 LAB — PROTIME-INR
INR: 1.1 (ref 0.8–1.2)
Prothrombin Time: 14.3 s (ref 11.4–15.2)

## 2024-03-01 LAB — BRAIN NATRIURETIC PEPTIDE: B Natriuretic Peptide: 155.3 pg/mL — ABNORMAL HIGH (ref 0.0–100.0)

## 2024-03-01 MED ORDER — SODIUM CHLORIDE 0.9 % IV SOLN
1.0000 g | Freq: Once | INTRAVENOUS | Status: DC
  Filled 2024-03-01: qty 10

## 2024-03-01 MED ORDER — LACTATED RINGERS IV BOLUS
1000.0000 mL | Freq: Once | INTRAVENOUS | Status: AC
  Administered 2024-03-01: 1000 mL via INTRAVENOUS

## 2024-03-01 MED ORDER — PIPERACILLIN-TAZOBACTAM 3.375 G IVPB 30 MIN
3.3750 g | Freq: Once | INTRAVENOUS | Status: AC
  Administered 2024-03-01: 3.375 g via INTRAVENOUS
  Filled 2024-03-01: qty 50

## 2024-03-01 NOTE — ED Triage Notes (Signed)
 BIB [facility]. Called out for generalized weakness with decreased appetite. Pt disoriented to time. Fever reported by EMS of 101. Decreased lung sounds with Rhonchi reported.   BP 148/78 HR 80 SpO2 98/99 ETCO2 23 18 g LAC - 750 mL of LR 650 mg tylenol  PO

## 2024-03-01 NOTE — ED Provider Notes (Signed)
  EMERGENCY DEPARTMENT AT [facility] Provider Note   CSN: [id number] Arrival date & time: 03/01/24  2119     History {Add pertinent medical, surgical, social history, OB history to HPI:1} Chief Complaint  Patient presents with   Weakness    Walter Brown is a 83 y.o. male with PMH as listed below who presents BIB [facility] called out for generalized weakness with decreased appetite. Pt disoriented to time. Fever reported by EMS of 101 F. Decreased lung sounds with rhonchi reported.    EMS vitals: BP 148/78 HR 80 SpO2 98% ETCO2 23 18 g LAC - 750 mL of LR 650 mg tylenol  PO   Past Medical History:  Diagnosis Date   A-fib (HCC)    Allergy    Anemia    mild   Arthritis 04/06/2017   Asthma    childhood   ASVD (arteriosclerotic vascular disease) 09/07/2018   Back pain with radiation 04/26/2013   Low back with LLE radiculopathy   Basal cell carcinoma    skin- on nose- basal cell (20 yrs ago) forehead 1 year ago     Sees Dr Thais Fill of dermatology   BPH (benign prostatic hyperplasia)    Bradycardia 06/28/2018   Cataract 10/07/2014   Cerumen impaction 08/10/2012   Chicken pox as child   Coronary artery calcification 11/23/2019   Dysphagia 09/11/2021   Elevated LFTs    Essential hypertension    ETD (Eustachian tube dysfunction), right 01/17/2022   Generalized anxiety disorder    [place] measles as a child   Hemoptysis 01/17/2022   Hyperglycemia 04/25/2020   Hyperlipidemia    Insomnia 02/06/2021   Internal hemorrhoids    Irregular cardiac rhythm 03/10/2018   Lipoma 08/04/2018   Macular degeneration    Major depressive disorder 08/01/2012   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 Authorizing Provider  acetaminophen  (TYLENOL ) 500 MG tablet Take 2 tablets (1,000 mg total) by mouth every 6 (six) hours as needed for moderate pain (pain score 4-6). 08/13/23   Debbra Fairy, PA-C  amiodarone  (PACERONE ) 200 MG tablet TAKE 1/2 TABLET BY MOUTH DAILY 10/02/23   Avanell Leigh, MD  bacitracin  ointment Apply 1 Application topically 2 (two) times daily. Patient not taking: Reported on 02/06/2024 11/04/23   Felicie Horning, PA-C  benzonatate  (TESSALON ) 100 MG capsule TAKE 1-2 CAPSULES (100-200 MG TOTAL) BY MOUTH 2 (TWO) TIMES DAILY AS NEEDED FOR COUGH. 12/16/23   Neda Balk, MD  budesonide -formoterol  (SYMBICORT ) 160-4.5 MCG/ACT inhaler Inhale 2 puffs into the lungs 2 (two) times daily. 12/05/23   Ezell Hollow, MD  calcium -vitamin D (OSCAL WITH D) 500-5 MG-MCG tablet Take 1 tablet by mouth.    [provider]  carbidopa -levodopa  (SINEMET  CR) 50-200 MG tablet TAKE 1 TABLET BY MOUTH EVERYDAY AT BEDTIME 11/27/23   Tat, Rebecca S, DO  carbidopa -levodopa  (SINEMET  IR) 25-100 MG tablet 2 AT 7AM, 2 AT 11AM, Take  1 AT 3PM, Take 1 at  7 PM 01/28/24   Tat, Von Grumbling, DO  divalproex  (DEPAKOTE ) 125 MG DR tablet TAKE 1 TABLET (125 MG TOTAL) BY MOUTH AT BEDTIME 10/29/23   Wertman, Sara E, PA-C  ezetimibe  (ZETIA ) 10 MG tablet TAKE 1 TABLET BY MOUTH EVERY DAY 02/27/23   Walker, Caitlin S, NP  guaifenesin  (ROBITUSSIN) 100 MG/5ML syrup Take 10 mLs (200 mg total) by mouth 3 (three) times daily as needed for cough. 12/05/23   Ezell Hollow, MD  hydrochlorothiazide  (HYDRODIURIL ) 25 MG tablet TAKE 1 TABLET BY MOUTH EVERY DAY 10/02/23   Neda Balk, MD  levalbuterol  (XOPENEX  HFA) 45 MCG/ACT inhaler INHALE 1 TO 2 PUFFS BY MOUTH EVERY 6 HOURS AS NEEDED FOR  WHEEZE 12/16/23   Neda Balk, MD  Melatonin 10 MG TABS Take 10 mg by mouth daily as needed.    [provider]  mirabegron ER (MYRBETRIQ) 50 MG TB24 tablet Take 50 mg by mouth daily.    [provider]  Multiple Vitamins-Minerals (ICAPS AREDS 2 PO) Take 1 capsule by mouth in the morning and at bedtime.    [provider]  pantoprazole  (PROTONIX ) 40 MG tablet Take 1 tablet (40 mg total) by mouth daily as needed. 07/14/23   O'Sullivan, Melissa, NP  Semaglutide -Weight Management (WEGOVY ) 0.25 MG/0.5ML SOAJ Inject 0.25 mg into the skin once a week. Patient not taking: Reported on 02/06/2024 10/30/23   Neda Balk, MD  sertraline  (ZOLOFT ) 100 MG tablet TAKE 1 TABLET BY MOUTH EVERY DAY 10/02/23   Neda Balk, MD  solifenacin (VESICARE) 5 MG tablet Take 5 mg by mouth daily. 11/06/23   [provider]  tamsulosin  (FLOMAX ) 0.4 MG CAPS capsule Take 1 capsule (0.4 mg total) by mouth daily. Patient not taking: Reported on 02/06/2024 07/14/23   O'Sullivan, Melissa, NP      Allergies    Albuterol  sulfate, Azithromycin, and Doxycycline hyclate    Review of Systems   Review of Systems A 10 point review of systems was performed and is negative unless otherwise reported in HPI.  Physical Exam Updated Vital Signs Ht 5\' 7"  (1.702 m)   Wt 81.6 kg   BMI 28.19 kg/m  Physical Exam General: Normal appearing {Desc; male/male:11659}, lying in bed.  HEENT: PERRLA, Sclera anicteric, MMM, trachea midline.  Cardiology: RRR, no murmurs/rubs/gallops. BL radial and DP pulses equal bilaterally.  Resp: Normal respiratory rate and effort. CTAB, no wheezes, rhonchi, crackles.  Abd: Soft, non-tender, non-distended. No rebound tenderness or guarding.  GU: Deferred. MSK: No peripheral edema or signs of trauma. Extremities without deformity or TTP. No cyanosis or clubbing. Skin: warm, dry. No rashes or lesions. Back: No CVA tenderness Neuro: A&Ox4, CNs II-XII grossly intact. MAEs.  Sensation grossly intact.  Psych: Normal mood and affect.   ED Results / Procedures / Treatments   Labs (all labs ordered are listed, but only abnormal results are displayed) Labs Reviewed - No data to display  EKG None  Radiology No results found.  Procedures Procedures  {Document cardiac monitor, telemetry assessment procedure when appropriate:1}  Medications Ordered in ED Medications - No data to display  ED Course/ Medical Decision Making/ A&P                          Medical Decision Making   This patient presents to the ED for concern of ***, this involves an extensive number of treatment options, and is a complaint that carries with it a  high risk of complications and morbidity.  I considered the following differential and admission for this acute, potentially life threatening condition.   MDM:    ***     Labs: I Ordered, and personally interpreted labs.  The pertinent results include:  ***  Imaging Studies ordered: I ordered imaging studies including *** I independently visualized and interpreted imaging. I agree with the radiologist interpretation  Additional history obtained from ***.  External records from outside source obtained and reviewed including ***  Cardiac Monitoring: The patient was maintained on a cardiac monitor.  I personally viewed and interpreted the cardiac monitored which showed an underlying rhythm of: ***  Reevaluation: After the interventions noted above, I reevaluated the patient and found that they have :{resolved/improved/worsened:23923::"improved"}  Social Determinants of Health: ***  Disposition:  ***  Co morbidities that complicate the patient evaluation  Past Medical History:  Diagnosis Date   A-fib (HCC)    Allergy    Anemia    mild   Arthritis 04/06/2017   Asthma    childhood   ASVD (arteriosclerotic vascular disease) 09/07/2018   Back pain with radiation 04/26/2013   Low back with LLE radiculopathy   Basal  cell carcinoma    skin- on nose- basal cell (20 yrs ago) forehead 1 year ago     Sees Dr Thais Fill of dermatology   BPH (benign prostatic hyperplasia)    Bradycardia 06/28/2018   Cataract 10/07/2014   Cerumen impaction 08/10/2012   Chicken pox as child   Coronary artery calcification 11/23/2019   Dysphagia 09/11/2021   Elevated LFTs    Essential hypertension    ETD (Eustachian tube dysfunction), right 01/17/2022   Generalized anxiety disorder    [place] measles as a child   Hemoptysis 01/17/2022   Hyperglycemia 04/25/2020   Hyperlipidemia    Insomnia 02/06/2021   Internal hemorrhoids    Irregular cardiac rhythm 03/10/2018   Lipoma 08/04/2018   Macular degeneration    Major depressive disorder 08/01/2012   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   I have reviewed the patients home medicines and have made adjustments as needed  Problem List / ED Course: Problem List Items Addressed This Visit   None        {Document critical care time when appropriate:1} {Document review of labs and clinical decision tools ie heart score, Chads2Vasc2 etc:1}  {Document your independent review of radiology images, and any outside records:1} {Document your discussion with family members, caretakers, and with consultants:1} {Document social determinants of health affecting pt's care:1} {Document your decision making why or why not admission, treatments were needed:1}  This note was created using  dictation software, which may contain spelling or grammatical errors.

## 2024-03-02 ENCOUNTER — Observation Stay (HOSPITAL_COMMUNITY)

## 2024-03-02 DIAGNOSIS — Z7951 Long term (current) use of inhaled steroids: Secondary | ICD-10-CM

## 2024-03-02 DIAGNOSIS — D696 Thrombocytopenia, unspecified: Secondary | ICD-10-CM | POA: Diagnosis present

## 2024-03-02 DIAGNOSIS — I959 Hypotension, unspecified: Secondary | ICD-10-CM | POA: Diagnosis not present

## 2024-03-02 DIAGNOSIS — Z818 Family history of other mental and behavioral disorders: Secondary | ICD-10-CM

## 2024-03-02 DIAGNOSIS — K219 Gastro-esophageal reflux disease without esophagitis: Secondary | ICD-10-CM | POA: Diagnosis present

## 2024-03-02 DIAGNOSIS — G912 (Idiopathic) normal pressure hydrocephalus: Secondary | ICD-10-CM | POA: Diagnosis present

## 2024-03-02 DIAGNOSIS — E876 Hypokalemia: Secondary | ICD-10-CM

## 2024-03-02 DIAGNOSIS — I480 Paroxysmal atrial fibrillation: Secondary | ICD-10-CM | POA: Diagnosis present

## 2024-03-02 DIAGNOSIS — Z743 Need for continuous supervision: Secondary | ICD-10-CM | POA: Diagnosis not present

## 2024-03-02 DIAGNOSIS — A419 Sepsis, unspecified organism: Secondary | ICD-10-CM | POA: Diagnosis present

## 2024-03-02 DIAGNOSIS — F39 Unspecified mood [affective] disorder: Secondary | ICD-10-CM | POA: Diagnosis present

## 2024-03-02 DIAGNOSIS — G20A1 Parkinson's disease without dyskinesia, without mention of fluctuations: Secondary | ICD-10-CM | POA: Diagnosis present

## 2024-03-02 DIAGNOSIS — Z888 Allergy status to other drugs, medicaments and biological substances status: Secondary | ICD-10-CM

## 2024-03-02 DIAGNOSIS — Z8042 Family history of malignant neoplasm of prostate: Secondary | ICD-10-CM

## 2024-03-02 DIAGNOSIS — Z1152 Encounter for screening for COVID-19: Secondary | ICD-10-CM

## 2024-03-02 DIAGNOSIS — Z821 Family history of blindness and visual loss: Secondary | ICD-10-CM

## 2024-03-02 DIAGNOSIS — G9341 Metabolic encephalopathy: Secondary | ICD-10-CM | POA: Diagnosis present

## 2024-03-02 DIAGNOSIS — J4489 Other specified chronic obstructive pulmonary disease: Secondary | ICD-10-CM | POA: Diagnosis present

## 2024-03-02 DIAGNOSIS — Z8249 Family history of ischemic heart disease and other diseases of the circulatory system: Secondary | ICD-10-CM

## 2024-03-02 DIAGNOSIS — Z85828 Personal history of other malignant neoplasm of skin: Secondary | ICD-10-CM | POA: Diagnosis not present

## 2024-03-02 DIAGNOSIS — Z87891 Personal history of nicotine dependence: Secondary | ICD-10-CM

## 2024-03-02 DIAGNOSIS — Z823 Family history of stroke: Secondary | ICD-10-CM

## 2024-03-02 DIAGNOSIS — Z79899 Other long term (current) drug therapy: Secondary | ICD-10-CM

## 2024-03-02 DIAGNOSIS — I10 Essential (primary) hypertension: Secondary | ICD-10-CM

## 2024-03-02 DIAGNOSIS — Z820 Family history of epilepsy and other diseases of the nervous system: Secondary | ICD-10-CM

## 2024-03-02 DIAGNOSIS — Z825 Family history of asthma and other chronic lower respiratory diseases: Secondary | ICD-10-CM

## 2024-03-02 DIAGNOSIS — I6782 Cerebral ischemia: Secondary | ICD-10-CM | POA: Diagnosis not present

## 2024-03-02 DIAGNOSIS — Z881 Allergy status to other antibiotic agents status: Secondary | ICD-10-CM

## 2024-03-02 DIAGNOSIS — Z822 Family history of deafness and hearing loss: Secondary | ICD-10-CM

## 2024-03-02 DIAGNOSIS — Z7985 Long-term (current) use of injectable non-insulin antidiabetic drugs: Secondary | ICD-10-CM

## 2024-03-02 DIAGNOSIS — Z9181 History of falling: Secondary | ICD-10-CM

## 2024-03-02 DIAGNOSIS — R410 Disorientation, unspecified: Secondary | ICD-10-CM | POA: Diagnosis not present

## 2024-03-02 DIAGNOSIS — Z8673 Personal history of transient ischemic attack (TIA), and cerebral infarction without residual deficits: Secondary | ICD-10-CM

## 2024-03-02 DIAGNOSIS — E785 Hyperlipidemia, unspecified: Secondary | ICD-10-CM | POA: Diagnosis present

## 2024-03-02 DIAGNOSIS — N400 Enlarged prostate without lower urinary tract symptoms: Secondary | ICD-10-CM | POA: Diagnosis present

## 2024-03-02 DIAGNOSIS — R6520 Severe sepsis without septic shock: Secondary | ICD-10-CM | POA: Insufficient documentation

## 2024-03-02 DIAGNOSIS — E872 Acidosis, unspecified: Secondary | ICD-10-CM | POA: Diagnosis present

## 2024-03-02 DIAGNOSIS — I2510 Atherosclerotic heart disease of native coronary artery without angina pectoris: Secondary | ICD-10-CM | POA: Diagnosis present

## 2024-03-02 DIAGNOSIS — R296 Repeated falls: Secondary | ICD-10-CM | POA: Diagnosis present

## 2024-03-02 DIAGNOSIS — G4489 Other headache syndrome: Secondary | ICD-10-CM | POA: Diagnosis not present

## 2024-03-02 DIAGNOSIS — G9389 Other specified disorders of brain: Secondary | ICD-10-CM | POA: Diagnosis not present

## 2024-03-02 DIAGNOSIS — Z7901 Long term (current) use of anticoagulants: Secondary | ICD-10-CM | POA: Diagnosis not present

## 2024-03-02 DIAGNOSIS — Z8261 Family history of arthritis: Secondary | ICD-10-CM

## 2024-03-02 DIAGNOSIS — N401 Enlarged prostate with lower urinary tract symptoms: Secondary | ICD-10-CM | POA: Diagnosis not present

## 2024-03-02 DIAGNOSIS — R509 Fever, unspecified: Secondary | ICD-10-CM | POA: Diagnosis not present

## 2024-03-02 DIAGNOSIS — N390 Urinary tract infection, site not specified: Secondary | ICD-10-CM | POA: Diagnosis present

## 2024-03-02 HISTORY — DX: Hypokalemia: E87.6

## 2024-03-02 HISTORY — DX: Metabolic encephalopathy: G93.41

## 2024-03-02 LAB — BLOOD CULTURE ID PANEL (REFLEXED) - BCID2

## 2024-03-02 LAB — URINALYSIS, W/ REFLEX TO CULTURE (INFECTION SUSPECTED)
Bilirubin Urine: NEGATIVE
Glucose, UA: NEGATIVE mg/dL
Hgb urine dipstick: NEGATIVE
Ketones, ur: 5 mg/dL — AB
Nitrite: POSITIVE — AB
Protein, ur: NEGATIVE mg/dL
Specific Gravity, Urine: 1.015 (ref 1.005–1.030)
pH: 6 (ref 5.0–8.0)

## 2024-03-02 LAB — URINE CULTURE: Culture: <10000 — AB

## 2024-03-02 MED ORDER — CARBIDOPA-LEVODOPA ER 50-200 MG PO TBCR
1.0000 | EXTENDED_RELEASE_TABLET | Freq: Every day | ORAL | Status: DC
Start: 2024-03-02 — End: 2024-03-04
  Administered 2024-03-02 – 2024-03-03 (×2): 1 via ORAL
  Filled 2024-03-02 (×2): qty 1

## 2024-03-02 MED ORDER — EZETIMIBE 10 MG PO TABS
10.0000 mg | ORAL_TABLET | Freq: Every day | ORAL | Status: DC
  Administered 2024-03-02 – 2024-03-04 (×3): 10 mg via ORAL
  Filled 2024-03-02 (×3): qty 1

## 2024-03-02 MED ORDER — MOMETASONE FURO-FORMOTEROL FUM 200-5 MCG/ACT IN AERO: 2.0000 | INHALATION_SPRAY | Freq: Two times a day (BID) | RESPIRATORY_TRACT | Status: DC

## 2024-03-02 MED ORDER — POTASSIUM CHLORIDE CRYS ER 20 MEQ PO TBCR
40.0000 meq | EXTENDED_RELEASE_TABLET | Freq: Once | ORAL | Status: AC
  Administered 2024-03-02: 40 meq via ORAL
  Filled 2024-03-02: qty 2

## 2024-03-02 MED ORDER — SODIUM CHLORIDE 0.9 % IV SOLN
1.0000 g | INTRAVENOUS | Status: DC
  Administered 2024-03-02: 1 g via INTRAVENOUS
  Filled 2024-03-02: qty 10

## 2024-03-02 MED ORDER — CARBIDOPA-LEVODOPA 25-250 MG PO TABS
2.0000 | ORAL_TABLET | ORAL | Status: DC
  Administered 2024-03-02 – 2024-03-04 (×5): 2 via ORAL
  Filled 2024-03-02 (×6): qty 2

## 2024-03-02 MED ORDER — FESOTERODINE FUMARATE ER 4 MG PO TB24
4.0000 mg | ORAL_TABLET | Freq: Every day | ORAL | Status: DC
  Administered 2024-03-02 – 2024-03-04 (×3): 4 mg via ORAL
  Filled 2024-03-02 (×4): qty 1

## 2024-03-02 MED ORDER — AMIODARONE HCL 200 MG PO TABS
100.0000 mg | ORAL_TABLET | Freq: Every day | ORAL | Status: DC
  Administered 2024-03-02 – 2024-03-04 (×3): 100 mg via ORAL
  Filled 2024-03-02 (×3): qty 1

## 2024-03-02 MED ORDER — SODIUM CHLORIDE 0.9 % IV SOLN
2.0000 g | INTRAVENOUS | Status: DC
  Administered 2024-03-03: 2 g via INTRAVENOUS
  Filled 2024-03-02: qty 20

## 2024-03-02 MED ORDER — CARBIDOPA-LEVODOPA 25-250 MG PO TABS
1.0000 | ORAL_TABLET | ORAL | Status: DC
  Administered 2024-03-02 – 2024-03-03 (×3): 1 via ORAL
  Filled 2024-03-02 (×6): qty 1

## 2024-03-02 MED ORDER — ACETAMINOPHEN 325 MG PO TABS: 650.0000 mg | ORAL_TABLET | Freq: Four times a day (QID) | ORAL | Status: DC | PRN

## 2024-03-02 MED ORDER — SENNA 8.6 MG PO TABS
1.0000 | ORAL_TABLET | Freq: Two times a day (BID) | ORAL | Status: DC
  Administered 2024-03-02 – 2024-03-04 (×5): 8.6 mg via ORAL
  Filled 2024-03-02 (×5): qty 1

## 2024-03-02 MED ORDER — CARBIDOPA-LEVODOPA 25-100 MG PO TABS: 1.0000 | ORAL_TABLET | ORAL | Status: DC

## 2024-03-02 MED ORDER — SERTRALINE HCL 100 MG PO TABS
100.0000 mg | ORAL_TABLET | Freq: Every day | ORAL | Status: DC
Start: 2024-03-02 — End: 2024-03-04
  Administered 2024-03-02 – 2024-03-04 (×3): 100 mg via ORAL
  Filled 2024-03-02 (×3): qty 1

## 2024-03-02 MED ORDER — SODIUM CHLORIDE 0.9 % IV SOLN
1.0000 g | INTRAVENOUS | Status: AC
  Administered 2024-03-02: 1 g via INTRAVENOUS
  Filled 2024-03-02: qty 10

## 2024-03-02 MED ORDER — DIVALPROEX SODIUM 125 MG PO DR TAB
125.0000 mg | DELAYED_RELEASE_TABLET | Freq: Every day | ORAL | Status: DC
  Administered 2024-03-02 – 2024-03-03 (×2): 125 mg via ORAL
  Filled 2024-03-02 (×2): qty 1

## 2024-03-02 MED ORDER — SODIUM CHLORIDE 0.9 % IV SOLN: INTRAVENOUS | Status: AC

## 2024-03-02 MED ORDER — ACETAMINOPHEN 650 MG RE SUPP: 650.0000 mg | Freq: Four times a day (QID) | RECTAL | Status: DC | PRN

## 2024-03-02 NOTE — Plan of Care (Signed)

## 2024-03-02 NOTE — H&P (Signed)
 History and Physical    KENNIEL BERGSMA JYN:[id number] DOB: 1941-03-29 DOA: 03/01/2024  PCP: Neda Balk, MD  Patient coming from: Home  Chief Complaint: AMS  HPI: Walter Brown is a 83 y.o. male with medical history significant of Parkinson's disease, NPH, TIA, hypertension, hyperlipidemia, paroxysmal A-fib no longer on anticoagulation due to recurrent falls, COPD, BPH, depression, GERD presenting for evaluation of altered mental status.  Patient is somnolent.  He is able to wake up briefly to answer a few questions but then falls asleep again.  He is oriented to person, place, and time.  He has no complaints.  Denies headaches, neck pain, shortness of breath, chest pain, or abdominal pain.  History provided mostly by his wife and caregiver at bedside.  Wife states patient has history of falls due to Parkinson's disease and normal pressure hydrocephalus.  A week ago as he was transferring from his wheelchair to a chair he had a fall and hit his head.  He did not lose consciousness.  He was not confused or somnolent at that time.  However, since yesterday patient has become somnolent, lethargic, and not eating or drinking.  No fevers reported.  Wife states patient wears a diaper due to chronic urinary incontinence.  He has been coughing for the past week but not had any shortness of breath.  No nausea or vomiting and patient has not complained of any abdominal pain.  ED Course: Temperature 101 F with EMS and was given Tylenol .  Labs showing WBC count 18.8, hemoglobin 12.6 (baseline 13-14), platelet count 137k, potassium 3.4, glucose 143, COVID/influenza/RSV PCR negative, BNP 155, lactic acid 2.4> 0.9, blood cultures in process.  UA with positive nitrite, small amount of leukocytes, and microscopy showing 11-20 WBCs and few bacteria.  Urine culture in process.  Chest x-ray showing no acute cardiopulmonary process. Patient was given Zosyn and 1 L LR in the ED.  Review of Systems:  Review of Systems   All other systems reviewed and are negative.   Past Medical History:  Diagnosis Date   A-fib [facility])    Allergy    Anemia    mild   Arthritis 04/06/2017   Asthma    childhood   ASVD (arteriosclerotic vascular disease) 09/07/2018   Back pain with radiation 04/26/2013   Low back with LLE radiculopathy   Basal cell carcinoma    skin- on nose- basal cell (20 yrs ago) forehead 1 year ago     Sees Dr Thais Fill of dermatology   BPH (benign prostatic hyperplasia)    Bradycardia 06/28/2018   Cataract 10/07/2014   Cerumen impaction 08/10/2012   Chicken pox as child   Coronary artery calcification 11/23/2019   Dysphagia 09/11/2021   Elevated LFTs    Essential hypertension    ETD (Eustachian tube dysfunction), right 01/17/2022   Generalized anxiety disorder    [place] measles as a child   Hemoptysis 01/17/2022   Hyperglycemia 04/25/2020   Hyperlipidemia    Insomnia 02/06/2021   Internal hemorrhoids    Irregular cardiac rhythm 03/10/2018   Lipoma 08/04/2018   Macular degeneration    Major depressive disorder 08/01/2012   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   Nocturia 12/04/2022   Obstructive sleep apnea 11/09/2018   Patient reports mild symptoms; was not prescribed a CPAP machine   Otitis externa 08/10/2012   PAF (paroxysmal atrial fibrillation)    Palpitations 09/07/2018   Parkinson's disease (HCC) 05/19/2019   Recurrent falls 12/04/2022  Thrombocytopenia    TIA (transient ischemic attack) 03/10/2018   Unsteady gait 09/11/2021    Past Surgical History:  Procedure Laterality Date   BELPHAROPTOSIS REPAIR     very young, b/l   EXCISIONAL HEMORRHOIDECTOMY     EYE SURGERY  2017   eyelids   HYDROCELE EXCISION / REPAIR  2012   b/l   SKIN CANCER EXCISION     nose and forehead, basal cell CA   UPPER GASTROINTESTINAL ENDOSCOPY       reports that he quit smoking about 45 years ago. His smoking use included cigarettes. He started  smoking about 45 years ago. He has a 22.5 pack-year smoking history. He has never used smokeless tobacco. He reports current alcohol use of about 7.0 standard drinks of alcohol per week. He reports that he does not currently use drugs.  Allergies  Allergen Reactions   Albuterol  Sulfate Palpitations   Azithromycin Other (See Comments)    Hepatotoxicity   Doxycycline Hyclate Other (See Comments)    Taken with Azithromycin and had Heaptotoxicity    Family History  Problem Relation Age of Onset   Hypertension Mother    Other Mother        MRSA   Heart disease Mother        stents   ADD / ADHD Mother    Arthritis Mother    Asthma Mother    Depression Mother    Vision loss Mother    Heart disease Father    Hypertension Father    Prostate cancer Father 18   Parkinson's disease Father    Cancer Father 75   Hearing loss Father    Stroke Father    Vision loss Father    Varicose Veins Father    Stroke Maternal Grandmother    Prostate cancer Maternal Grandfather    Asthma Maternal Grandfather    Cancer Maternal Grandfather    Cancer Paternal Grandfather        EYE   Hearing loss Paternal Grandfather    ADD / ADHD Son        ADHD   Other Son 24       part of 1 lung removed- due to infection   Alcohol abuse Son    Depression Son    Alcohol abuse Son    Alcohol abuse Maternal Uncle    Depression Maternal Uncle    Asthma Maternal Uncle    Heart disease Paternal Uncle    Colon cancer Neg Hx    Esophageal cancer Neg Hx    Rectal cancer Neg Hx    Stomach cancer Neg Hx     Prior to Admission medications   Medication Sig Start Date End Date Taking? Authorizing Provider  acetaminophen  (TYLENOL ) 500 MG tablet Take 2 tablets (1,000 mg total) by mouth every 6 (six) hours as needed for moderate pain (pain score 4-6). 08/13/23  Yes Debbra Fairy, PA-C  amiodarone  (PACERONE ) 200 MG tablet TAKE 1/2 TABLET BY MOUTH DAILY 10/02/23  Yes Avanell Leigh, MD  benzonatate  (TESSALON ) 100 MG  capsule TAKE 1-2 CAPSULES (100-200 MG TOTAL) BY MOUTH 2 (TWO) TIMES DAILY AS NEEDED FOR COUGH. 12/16/23  Yes Neda Balk, MD  budesonide -formoterol  (SYMBICORT ) 160-4.5 MCG/ACT inhaler Inhale 2 puffs into the lungs 2 (two) times daily. 12/05/23  Yes Paz, Jose E, MD  carbidopa -levodopa  (SINEMET  CR) 50-200 MG tablet TAKE 1 TABLET BY MOUTH EVERYDAY AT BEDTIME 11/27/23  Yes Tat, Von Grumbling, DO  carbidopa -levodopa  (SINEMET  IR) 25-100 MG  tablet 2 AT 7AM, 2 AT 11AM, Take  1 AT 3PM, Take 1 at 7 PM 01/28/24  Yes Tat, Von Grumbling, DO  cholecalciferol (VITAMIN D3) 25 MCG (1000 UNIT) tablet Take 2,000 Units by mouth daily.   Yes [provider]  divalproex  (DEPAKOTE ) 125 MG DR tablet TAKE 1 TABLET (125 MG TOTAL) BY MOUTH AT BEDTIME 10/29/23  Yes Wertman, Sara E, PA-C  ezetimibe  (ZETIA ) 10 MG tablet TAKE 1 TABLET BY MOUTH EVERY DAY 02/27/23  Yes Walker, Caitlin S, NP  guaifenesin  (ROBITUSSIN) 100 MG/5ML syrup Take 10 mLs (200 mg total) by mouth 3 (three) times daily as needed for cough. 12/05/23  Yes Ezell Hollow, MD  hydrochlorothiazide  (HYDRODIURIL ) 25 MG tablet TAKE 1 TABLET BY MOUTH EVERY DAY 10/02/23  Yes Neda Balk, MD  levalbuterol  (XOPENEX  HFA) 45 MCG/ACT inhaler INHALE 1 TO 2 PUFFS BY MOUTH EVERY 6 HOURS AS NEEDED FOR WHEEZE 12/16/23  Yes Neda Balk, MD  Melatonin 10 MG TABS Take 10 mg by mouth daily as needed.   Yes [provider]  mirabegron ER (MYRBETRIQ) 50 MG TB24 tablet Take 50 mg by mouth daily.   Yes [provider]  Multiple Vitamins-Minerals (ICAPS AREDS 2 PO) Take 1 capsule by mouth in the morning and at bedtime.   Yes [provider]  pantoprazole  (PROTONIX ) 40 MG tablet Take 1 tablet (40 mg total) by mouth daily as needed. 07/14/23  Yes Dorrene Gaucher, NP  senna (SENOKOT) 8.6 MG TABS tablet Take 1 tablet by mouth in the morning and at bedtime.   Yes [provider]  sertraline  (ZOLOFT ) 100 MG tablet TAKE 1 TABLET BY MOUTH EVERY DAY 10/02/23   Yes Neda Balk, MD  solifenacin (VESICARE) 5 MG tablet Take 5 mg by mouth daily. 11/06/23  Yes [provider]    Physical Exam: Vitals:   03/02/24 0245 03/02/24 0300 03/02/24 0430 03/02/24 0607  BP: (!) 149/65 (!) 142/88 (!) 141/82   Pulse: 69 71 66   Resp: (!) 21 (!) 24 (!) 21   Temp:    97.8 F (36.6 C)  TempSrc:    Oral  SpO2: 96% 99% 97%   Weight:      Height:        Physical Exam Vitals reviewed.  Constitutional:      General: He is not in acute distress. HENT:     Head: Normocephalic and atraumatic.     Mouth/Throat:     Mouth: Mucous membranes are dry.  Eyes:     Extraocular Movements: Extraocular movements intact.  Cardiovascular:     Rate and Rhythm: Normal rate and regular rhythm.     Pulses: Normal pulses.  Pulmonary:     Effort: Pulmonary effort is normal. No respiratory distress.     Breath sounds: Normal breath sounds. No wheezing or rales.  Abdominal:     General: Bowel sounds are normal. There is no distension.     Palpations: Abdomen is soft.     Tenderness: There is no abdominal tenderness. There is no guarding.  Musculoskeletal:     Cervical back: Normal range of motion.     Right lower leg: No edema.     Left lower leg: No edema.  Skin:    General: Skin is warm and dry.  Neurological:     General: No focal deficit present.     Mental Status: He is alert.     Cranial Nerves: No cranial nerve deficit.  Sensory: No sensory deficit.     Motor: No weakness.     Comments: Somnolent but arousable Following commands appropriately     Labs on Admission: I have personally reviewed following labs and imaging studies  CBC: Recent Labs  Lab 03/01/24 2145  WBC 18.8*  NEUTROABS 15.7*  HGB 12.6*  HCT 37.4*  MCV 95.4  PLT 137*   Basic Metabolic Panel: Recent Labs  Lab 03/01/24 2145  NA 138  K 3.4*  CL 101  CO2 25  GLUCOSE 143*  BUN 19  CREATININE 1.11  CALCIUM  9.3   GFR: Estimated Creatinine Clearance: 52.5 mL/min  (by C-G formula based on SCr of 1.11 mg/dL). Liver Function Tests: Recent Labs  Lab 03/01/24 2145  AST 22  ALT 7  ALKPHOS 44  BILITOT 1.0  PROT 6.0*  ALBUMIN 3.4*   No results for input(s): "LIPASE", "AMYLASE" in the last 168 hours. No results for input(s): "AMMONIA" in the last 168 hours. Coagulation Profile: Recent Labs  Lab 03/01/24 2145  INR 1.1   Cardiac Enzymes: No results for input(s): "CKTOTAL", "CKMB", "CKMBINDEX", "TROPONINI" in the last 168 hours. BNP (last 3 results) No results for input(s): "PROBNP" in the last 8760 hours. HbA1C: No results for input(s): "HGBA1C" in the last 72 hours. CBG: No results for input(s): "GLUCAP" in the last 168 hours. Lipid Profile: No results for input(s): "CHOL", "HDL", "LDLCALC", "TRIG", "CHOLHDL", "LDLDIRECT" in the last 72 hours. Thyroid  Function Tests: No results for input(s): "TSH", "T4TOTAL", "FREET4", "T3FREE", "THYROIDAB" in the last 72 hours. Anemia Panel: No results for input(s): "VITAMINB12", "FOLATE", "FERRITIN", "TIBC", "IRON", "RETICCTPCT" in the last 72 hours. Urine analysis:    Component Value Date/Time   COLORURINE YELLOW 03/02/2024 0033   APPEARANCEUR CLEAR 03/02/2024 0033   LABSPEC 1.015 03/02/2024 0033   PHURINE 6.0 03/02/2024 0033   GLUCOSEU NEGATIVE 03/02/2024 0033   GLUCOSEU NEGATIVE 03/11/2023 1210   HGBUR NEGATIVE 03/02/2024 0033   BILIRUBINUR NEGATIVE 03/02/2024 0033   KETONESUR 5 (A) 03/02/2024 0033   PROTEINUR NEGATIVE 03/02/2024 0033   UROBILINOGEN 0.2 03/11/2023 1210   NITRITE POSITIVE (A) 03/02/2024 0033   LEUKOCYTESUR SMALL (A) 03/02/2024 0033    Radiological Exams on Admission: DG Chest 2 View Result Date: 03/01/2024 CLINICAL DATA:  Generalized weakness and decreased appetite EXAM: CHEST - 2 VIEW COMPARISON:  12/23/2023 FINDINGS: Stable cardiomediastinal silhouette. Low lung volumes accentuate pulmonary vascularity. No focal consolidation, pleural effusion, or pneumothorax. No displaced  rib fractures. IMPRESSION: Low lung volumes without acute cardiopulmonary process. Electronically Signed   By: Rozell Cornet M.D.   On: 03/01/2024 22:08    Assessment and Plan  Sepsis secondary to UTI Meets criteria for sepsis with fever, leukocytosis, and mild lactic acidosis. UA with positive nitrite, small amount of leukocytes, and microscopy showing 11-20 WBCs and few bacteria.  Mild lactic acidosis has resolved after 1 L IV fluids.  Not hypotensive.  Continue gentle IV fluid hydration until his p.o. intake improves.  Continue antibiotic coverage with ceftriaxone.  Follow-up urine and blood cultures.  Acute metabolic encephalopathy Likely due to UTI.  Family reporting fall a week ago and patient hit his head.  No focal neurologic deficit on exam.  Stat CT head ordered.  Recurrent falls In the setting of Parkinson's disease and NPH.  CT head ordered as mentioned previously.  PT/OT eval, fall precautions.  Mild hypokalemia Monitor potassium and magnesium levels, continue to replenish as needed.  Parkinson's disease Mood disorder Continue Sinemet , Zoloft , and Depakote .  Hypertension  Resume home antihypertensive if CT head is negative for stroke.  Paroxysmal A-fib No longer on anticoagulation due to history of recurrent falls.  Continue home amiodarone  and EKG ordered to check QT interval.  COPD Stable, no signs of acute exacerbation.  Continue home inhaler.  Hyperlipidemia Continue Zetia .  DVT prophylaxis: SCDs until CT head is done Code Status: Full Code (discussed with the patient's wife) Family Communication: [name] and caregiver at bedside. Level of care: Progressive Care Unit Admission status: It is my clinical opinion that referral for OBSERVATION is reasonable and necessary in this patient based on the above information provided. The aforementioned taken together are felt to place the patient at high risk for further clinical deterioration. However, it is  anticipated that the patient may be medically stable for discharge from the hospital within 24 to 48 hours.  Juliette Oh MD [facility]  If 7PM-7AM, please contact night-coverage [email or web address]  03/02/2024, 6:21 AM

## 2024-03-02 NOTE — Progress Notes (Signed)
 CCMD notified this nurse that patient now in Afib on telemetry with HR 110-130s nonsustaining. Dr. Bonita Bussing notified and directed this nurse to continue to observe.

## 2024-03-02 NOTE — Progress Notes (Signed)
 PT Cancellation Note  Patient Details Name: Walter Brown MRN: [id number] DOB: Jun 08, 1941   Cancelled Treatment:    Reason Eval/Treat Not Completed: Medical issues which prohibited therapy New onset afib.  Abelina Hoes PT Acute Rehabilitation Services Office [phone]   Dareen Ebbing 03/02/2024, 4:23 PM

## 2024-03-02 NOTE — Progress Notes (Signed)
 [facility] PROGRESS NOTE    Progress Note  Walter Brown  WGN:[id number] DOB: Nov 11, 1940 DOA: 03/01/2024 PCP: Neda Balk, MD     Brief Narrative:   Walter Brown is an 83 y.o. male past medical history of Parkinson's disease, normal pressure hydrocephalus, recurrent falls, paroxysmal atrial fibrillation on Eliquis  is brought into the ED for somnolence poor appetite and a temperature of 101, white count of 18,000 UA was positive for nitrates and bacteria urine cultures were sent he was started empirically on antibiotics chest x-ray showed no acute findings.  Assessment/Plan:   Severe sepsis likely due to  UTI (urinary tract infection) Lactic acid was 2.4 he was fluid resuscitated started empirically on antibiotics. Urine cultures and blood cultures were sent. In the ED he received IV Zosyn transition to IV Rocephin. Tachycardia has resolved.  Acute metabolic encephalopathy: Likely due to infectious etiology. Still encephalopathic this morning.  Recurrent falls: The setting of Parkinson disease and normal pressure hydrocephalus. CT showed no acute findings of the head. PT OT has been consulted.  Chronic thrombocytopenia Appears to be chronic.  Essential hypertension: Hold antihypertensive medication.  BPH (benign prostatic hyperplasia) Continue Flomax .  PAF (paroxysmal atrial fibrillation): No longer on anticoagulation continue home dose of amiodarone .  Parkinson's disease (HCC) Sinemet  Zoloft  and Depakote .  Hypokalemia: Replete recheck in the morning    DVT prophylaxis: loveno Family Communication:wife Status is: Observation The patient remains OBS appropriate and will d/c before 2 midnights.    Code Status:     IV Access:   Peripheral IV   Procedures and diagnostic studies:   DG Chest 2 View Result Date: 03/01/2024 CLINICAL DATA:  Generalized weakness and decreased appetite EXAM: CHEST - 2 VIEW COMPARISON:  12/23/2023 FINDINGS: Stable  cardiomediastinal silhouette. Low lung volumes accentuate pulmonary vascularity. No focal consolidation, pleural effusion, or pneumothorax. No displaced rib fractures. IMPRESSION: Low lung volumes without acute cardiopulmonary process. Electronically Signed   By: Rozell Cornet M.D.   On: 03/01/2024 22:08     Medical Consultants:   None.   Subjective:    Walter Brown confused this morning  Objective:    Vitals:   03/02/24 0245 03/02/24 0300 03/02/24 0430 03/02/24 0607  BP: (!) 149/65 (!) 142/88 (!) 141/82   Pulse: 69 71 66   Resp: (!) 21 (!) 24 (!) 21   Temp:    97.8 F (36.6 C)  TempSrc:    Oral  SpO2: 96% 99% 97%   Weight:      Height:       SpO2: 97 %  No intake or output data in the 24 hours ending 03/02/24 0654 Filed Weights   03/01/24 2126  Weight: 81.6 kg    Exam: General exam: In no acute distress. Respiratory system: Good air movement and clear to auscultation. Cardiovascular system: S1 & S2 heard, RRR. No JVD. Gastrointestinal system: Abdomen is nondistended, soft and nontender.  Central nervous system: Alert and oriented to tactile stimuli somnolent Extremities: No pedal edema. Skin: No rashes, lesions or ulcers Psychiatry: No judgment or insight in medical condition.   Data Reviewed:    Labs: Basic Metabolic Panel: Recent Labs  Lab 03/01/24 2145  NA 138  K 3.4*  CL 101  CO2 25  GLUCOSE 143*  BUN 19  CREATININE 1.11  CALCIUM  9.3   GFR Estimated Creatinine Clearance: 52.5 mL/min (by C-G formula based on SCr of 1.11 mg/dL). Liver Function Tests: Recent Labs  Lab 03/01/24 2145  AST 22  ALT 7  ALKPHOS 44  BILITOT 1.0  PROT 6.0*  ALBUMIN 3.4*   No results for input(s): "LIPASE", "AMYLASE" in the last 168 hours. No results for input(s): "AMMONIA" in the last 168 hours. Coagulation profile Recent Labs  Lab 03/01/24 2145  INR 1.1   COVID-19 Labs  No results for input(s): "DDIMER", "FERRITIN", "LDH", "CRP" in the last 72  hours.  Lab Results  Component Value Date   SARSCOV2NAA NEGATIVE 03/01/2024   SARSCOV2NAA NEGATIVE 12/23/2023   SARSCOV2NAA NEGATIVE 09/29/2022   SARSCOV2NAA NEGATIVE 03/29/2019    CBC: Recent Labs  Lab 03/01/24 2145  WBC 18.8*  NEUTROABS 15.7*  HGB 12.6*  HCT 37.4*  MCV 95.4  PLT 137*   Cardiac Enzymes: No results for input(s): "CKTOTAL", "CKMB", "CKMBINDEX", "TROPONINI" in the last 168 hours. BNP (last 3 results) No results for input(s): "PROBNP" in the last 8760 hours. CBG: No results for input(s): "GLUCAP" in the last 168 hours. D-Dimer: No results for input(s): "DDIMER" in the last 72 hours. Hgb A1c: No results for input(s): "HGBA1C" in the last 72 hours. Lipid Profile: No results for input(s): "CHOL", "HDL", "LDLCALC", "TRIG", "CHOLHDL", "LDLDIRECT" in the last 72 hours. Thyroid  function studies: No results for input(s): "TSH", "T4TOTAL", "T3FREE", "THYROIDAB" in the last 72 hours.  Invalid input(s): "FREET3" Anemia work up: No results for input(s): "VITAMINB12", "FOLATE", "FERRITIN", "TIBC", "IRON", "RETICCTPCT" in the last 72 hours. Sepsis Labs: Recent Labs  Lab 03/01/24 2145 03/01/24 2153 03/01/24 2340  WBC 18.8*  --   --   LATICACIDVEN  --  2.4* 0.9   Microbiology Recent Results (from the past 240 hours)  Resp panel by RT-PCR (RSV, Flu A&B, Covid)     Status: None   Collection Time: 03/01/24  9:29 PM   Specimen: Nasal Swab  Result Value Ref Range Status   SARS Coronavirus 2 by RT PCR NEGATIVE NEGATIVE Final   Influenza A by PCR NEGATIVE NEGATIVE Final   Influenza B by PCR NEGATIVE NEGATIVE Final    Comment: (NOTE) The Xpert Xpress SARS-CoV-2/FLU/RSV plus assay is intended as an aid in the diagnosis of influenza from Nasopharyngeal swab specimens and should not be used as a sole basis for treatment. Nasal washings and aspirates are unacceptable for Xpert Xpress SARS-CoV-2/FLU/RSV testing.  Fact Sheet for  Patients: BloggerCourse.com  Fact Sheet for Healthcare Providers: SeriousBroker.it  This test is not yet approved or cleared by the [place]  FDA and has been authorized for detection and/or diagnosis of SARS-CoV-2 by FDA under an Emergency Use Authorization (EUA). This EUA will remain in effect (meaning this test can be used) for the duration of the COVID-19 declaration under Section 564(b)(1) of the Act, 21 U.S.C. section 360bbb-3(b)(1), unless the authorization is terminated or revoked.     Resp Syncytial Virus by PCR NEGATIVE NEGATIVE Final    Comment: (NOTE) Fact Sheet for Patients: BloggerCourse.com  Fact Sheet for Healthcare Providers: SeriousBroker.it  This test is not yet approved or cleared by the [place]  FDA and has been authorized for detection and/or diagnosis of SARS-CoV-2 by FDA under an Emergency Use Authorization (EUA). This EUA will remain in effect (meaning this test can be used) for the duration of the COVID-19 declaration under Section 564(b)(1) of the Act, 21 U.S.C. section 360bbb-3(b)(1), unless the authorization is terminated or revoked.  Performed at [facility], [address], [place], [place] [address]      Medications:    Continuous Infusions:    LOS: 0 days  Walter Brown  [facility]  03/02/2024, 6:54 AM

## 2024-03-02 NOTE — ED Provider Notes (Signed)
  Provider Note MRN:  [id number]  Arrival date & time: 03/02/24    ED Course and Medical Decision Making  Assumed care of patient at sign-out or upon transfer.  Sepsis of unclear source, history of Parkinson's, fever at home and some increased somnolence.  Still awaiting urinalysis.  Anticipating admission.  .Critical Care  Performed by: Edson Graces, MD Authorized by: Edson Graces, MD   Critical care provider statement:    Critical care time (minutes):  30   Critical care was necessary to treat or prevent imminent or life-threatening deterioration of the following conditions:  Sepsis   Critical care was time spent personally by me on the following activities:  Development of treatment plan with patient or surrogate, discussions with consultants, evaluation of patient's response to treatment, examination of patient, ordering and review of laboratory studies, ordering and review of radiographic studies, ordering and performing treatments and interventions, pulse oximetry, re-evaluation of patient's condition and review of old charts   I assumed direction of critical care for this patient from another provider in my specialty: yes     Care discussed with: admitting provider     Final Clinical Impressions(s) / ED Diagnoses     ICD-10-CM   1. Sepsis, due to unspecified organism, unspecified whether acute organ dysfunction present [facility])  A41.9       ED Discharge Orders     None       Discharge Instructions   None     Merrick Abe. Harless Lien, MD [facility] [email or web address]    Edson Graces, MD 03/02/24 [phone]

## 2024-03-02 NOTE — ED Notes (Signed)
 [facility] called, eta "sending out now"

## 2024-03-02 NOTE — Progress Notes (Signed)
 PHARMACY - PHYSICIAN COMMUNICATION CRITICAL VALUE ALERT - BLOOD CULTURE IDENTIFICATION (BCID)  Walter Brown is an 83 y.o. male who presented to [facility] on 03/01/2024 with AMS.  He was started on ceftriaxone on admission for suspected sepsis secondary to UTI.  Name of physician (or Provider) Contacted: Dr. Versa Gore  Current antibiotics: Ceftriaxone 1gm q24h  Changes to prescribed antibiotics recommended:  - increase ceftriaxone dose to 2gm q24h  Results for orders placed or performed during the hospital encounter of 03/01/24  Blood Culture ID Panel (Reflexed) (Collected: 03/01/2024 10:15 PM)  Result Value Ref Range   Enterococcus faecalis NOT DETECTED NOT DETECTED   Enterococcus Faecium NOT DETECTED NOT DETECTED   Listeria monocytogenes NOT DETECTED NOT DETECTED   Staphylococcus species NOT DETECTED NOT DETECTED   Staphylococcus aureus (BCID) NOT DETECTED NOT DETECTED   Staphylococcus epidermidis NOT DETECTED NOT DETECTED   Staphylococcus lugdunensis NOT DETECTED NOT DETECTED   Streptococcus species NOT DETECTED NOT DETECTED   Streptococcus agalactiae NOT DETECTED NOT DETECTED   Streptococcus pneumoniae NOT DETECTED NOT DETECTED   Streptococcus pyogenes NOT DETECTED NOT DETECTED   A.calcoaceticus-baumannii NOT DETECTED NOT DETECTED   Bacteroides fragilis NOT DETECTED NOT DETECTED   Enterobacterales DETECTED (A) NOT DETECTED   Enterobacter cloacae complex NOT DETECTED NOT DETECTED   Escherichia coli DETECTED (A) NOT DETECTED   Klebsiella aerogenes NOT DETECTED NOT DETECTED   Klebsiella oxytoca NOT DETECTED NOT DETECTED   Klebsiella pneumoniae NOT DETECTED NOT DETECTED   Proteus species NOT DETECTED NOT DETECTED   Salmonella species NOT DETECTED NOT DETECTED   Serratia marcescens NOT DETECTED NOT DETECTED   Haemophilus influenzae NOT DETECTED NOT DETECTED   Neisseria meningitidis NOT DETECTED NOT DETECTED   Pseudomonas aeruginosa NOT DETECTED NOT DETECTED    Stenotrophomonas maltophilia NOT DETECTED NOT DETECTED   Candida albicans NOT DETECTED NOT DETECTED   Candida auris NOT DETECTED NOT DETECTED   Candida glabrata NOT DETECTED NOT DETECTED   Candida krusei NOT DETECTED NOT DETECTED   Candida parapsilosis NOT DETECTED NOT DETECTED   Candida tropicalis NOT DETECTED NOT DETECTED   Cryptococcus neoformans/gattii NOT DETECTED NOT DETECTED   CTX-M ESBL NOT DETECTED NOT DETECTED   Carbapenem resistance IMP NOT DETECTED NOT DETECTED   Carbapenem resistance KPC NOT DETECTED NOT DETECTED   Carbapenem resistance NDM NOT DETECTED NOT DETECTED   Carbapenem resist OXA 48 LIKE NOT DETECTED NOT DETECTED   Carbapenem resistance VIM NOT DETECTED NOT DETECTED    Spurgeon Dyer 03/02/2024  12:28 PM

## 2024-03-02 NOTE — TOC Initial Note (Signed)
 Transition of Care [facility]) - Initial/Assessment Note    Patient Details  Name: Walter Brown MRN: [id number] Date of Birth: January 30, 1941  Transition of [facility]) CM/SW Contact:    Ruben Corolla, RN Phone Number: 03/02/2024, 9:51 AM  Clinical Narrative:  Spoke to spouse Anastacio Balm about d/c plans. Has rw,scooter. D/C plan home. Await PT eval.                 Expected Discharge Plan:  (TBD) Barriers to Discharge: Continued Medical Work up   Patient Goals and CMS Choice Patient states their goals for this hospitalization and ongoing recovery are:: Home CMS Medicare.gov Compare Post Acute Care list provided to:: Patient Represenative (must comment) (Claire(spouse)) Choice offered to / list presented to : Spouse      Expected Discharge Plan and Services                                              Prior Living Arrangements/Services                       Activities of Daily Living   ADL Screening (condition at time of admission) Independently performs ADLs?: No Does the patient have a NEW difficulty with bathing/dressing/toileting/self-feeding that is expected to last >3 days?: Yes (Initiates electronic notice to provider for possible OT consult) Does the patient have a NEW difficulty with getting in/out of bed, walking, or climbing stairs that is expected to last >3 days?: Yes (Initiates electronic notice to provider for possible PT consult) Does the patient have a NEW difficulty with communication that is expected to last >3 days?: No Is the patient deaf or have difficulty hearing?: No Does the patient have difficulty seeing, even when wearing glasses/contacts?: No Does the patient have difficulty concentrating, remembering, or making decisions?: No  Permission Sought/Granted                  Emotional Assessment              Admission diagnosis:  UTI (urinary tract infection) [N39.0] Sepsis, due to unspecified organism, unspecified whether acute  organ dysfunction present [facility]) [A41.9] Patient Active Problem List   Diagnosis Date Noted   UTI (urinary tract infection) 03/02/2024   Severe sepsis (HCC) 03/02/2024   Acute metabolic encephalopathy 03/02/2024   Hypokalemia 03/02/2024   Recurrent falls 12/04/2022   Nocturia 12/04/2022   Hemoptysis 01/17/2022   ETD (Eustachian tube dysfunction), right 01/17/2022   Dysphagia 09/11/2021   Unsteady gait 09/11/2021   Insomnia 02/06/2021   Major depressive disorder 04/25/2020   Hyperglycemia 04/25/2020   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   Coronary artery calcification 11/23/2019   Parkinson's disease (HCC) 05/19/2019   Obstructive sleep apnea 11/09/2018   PAF (paroxysmal atrial fibrillation) 09/09/2018   Palpitations 09/07/2018   ASVD (arteriosclerotic vascular disease) 09/07/2018   Lipoma 08/04/2018   Bradycardia 06/28/2018   TIA (transient ischemic attack) 03/10/2018   Arthritis 04/06/2017   Macular degeneration 03/03/2015   Back pain with radiation 04/26/2013   Respiratory illness    Hyperlipidemia    Essential hypertension    BPH (benign prostatic hyperplasia)    Basal cell carcinoma    Thrombocytopenia    PCP:  Neda Balk, MD Pharmacy:   [facility] [phone] - [place], [place] - [address]  [address] [place] [address] Phone: [phone] Fax: [phone]     Social Drivers of Health (SDOH) Social History: SDOH Screenings   Food Insecurity: No Food Insecurity (03/02/2024)  Housing: Low Risk  (03/02/2024)  Transportation Needs: No Transportation Needs (03/02/2024)  Utilities: Not At Risk (03/02/2024)  Alcohol Screen: Low Risk  (04/01/2023)  Depression (PHQ2-9): High Risk (12/05/2023)  Financial Resource Strain: Low Risk  (03/26/2022)  Physical Activity: Sufficiently Active (03/26/2022)  Social Connections: Moderately Isolated (03/02/2024)  Stress: Stress Concern Present (03/26/2022)  Tobacco  Use: Medium Risk (03/01/2024)   SDOH Interventions:     Readmission Risk Interventions     No data to display

## 2024-03-02 NOTE — Progress Notes (Signed)
 OT Cancellation Note  Patient Details Name: Walter Brown MRN: [id number] DOB: 25-Jan-1941   Cancelled Treatment:    Reason Eval/Treat Not Completed: Medical issues which prohibited therapy Patient with new onset of A fib. OT to continue to follow and check back as schedule will allow.  Wynette Heckler, MS Acute Rehabilitation Department Office# [phone]  03/02/2024, 3:30 PM

## 2024-03-03 ENCOUNTER — Ambulatory Visit: Payer: Medicare HMO | Admitting: Physician Assistant

## 2024-03-03 DIAGNOSIS — D696 Thrombocytopenia, unspecified: Secondary | ICD-10-CM

## 2024-03-03 DIAGNOSIS — N401 Enlarged prostate with lower urinary tract symptoms: Secondary | ICD-10-CM

## 2024-03-03 DIAGNOSIS — G9341 Metabolic encephalopathy: Secondary | ICD-10-CM | POA: Diagnosis not present

## 2024-03-03 DIAGNOSIS — A419 Sepsis, unspecified organism: Secondary | ICD-10-CM | POA: Diagnosis not present

## 2024-03-03 DIAGNOSIS — G20A1 Parkinson's disease without dyskinesia, without mention of fluctuations: Secondary | ICD-10-CM

## 2024-03-03 DIAGNOSIS — R6520 Severe sepsis without septic shock: Secondary | ICD-10-CM

## 2024-03-03 DIAGNOSIS — R296 Repeated falls: Secondary | ICD-10-CM

## 2024-03-03 DIAGNOSIS — I480 Paroxysmal atrial fibrillation: Secondary | ICD-10-CM

## 2024-03-03 DIAGNOSIS — N390 Urinary tract infection, site not specified: Secondary | ICD-10-CM | POA: Diagnosis not present

## 2024-03-03 MED ORDER — MELATONIN 3 MG PO TABS
3.0000 mg | ORAL_TABLET | Freq: Every day | ORAL | Status: DC
  Administered 2024-03-03: 3 mg via ORAL
  Filled 2024-03-03: qty 1

## 2024-03-03 MED ORDER — MELATONIN 5 MG PO TABS: 5.0000 mg | ORAL_TABLET | Freq: Every evening | ORAL | Status: DC | PRN

## 2024-03-03 NOTE — Progress Notes (Signed)
 PROGRESS NOTE    [name]  ZOX:[id number] DOB: 11-06-1940 DOA: 03/01/2024 PCP: Neda Balk, MD   Brief Narrative:  Walter Brown is an 83 y.o. male past medical history of Parkinson's disease, normal pressure hydrocephalus, recurrent falls, paroxysmal atrial fibrillation on Eliquis  is brought into the ED for somnolence poor appetite and a temperature of 101, white count of 18,000 UA was positive for nitrates and bacteria urine cultures were sent he was started empirically on antibiotics chest x-ray showed no acute findings.  Assessment & Plan:   Principal Problem:   UTI (urinary tract infection) Active Problems:   Thrombocytopenia   Essential hypertension   BPH (benign prostatic hyperplasia)   PAF (paroxysmal atrial fibrillation)   Parkinson's disease (HCC)   Recurrent falls   Severe sepsis (HCC)   Acute metabolic encephalopathy   Hypokalemia  Severe sepsis likely due to  UTI (urinary tract infection) POA Lactic acidosis, resolved Lactic acid elevated to 2.4, downtrending appropriately Continue empiric antibiotics with ceftriaxone. Urine and blood cultures were sent. Sepsis criteria including leukocytosis and tachycardia improving   Acute metabolic encephalopathy, improving Secondary to above, continue to follow clinically Per wife patient is improving but not yet back to baseline. Alert to person and general situation, localizes to [facility] , unknown date   Parkinson's disease Normal pressure hydrocephalus Ambulatory dysfunction  Recurrent falls: Continue home Sinemet  Zoloft  and Depakote . CT showed no acute findings of the head. PT OT has been consulted to evaluate.   Chronic thrombocytopenia Stable, near baseline Essential hypertension: Hold antihypertensive medication. BPH (benign prostatic hyperplasia) Continue Flomax . PAF (paroxysmal atrial fibrillation): No longer on anticoagulation continue home dose of amiodarone . Hypokalemia: Repleted, follow  repeat labs   DVT prophylaxis: SCDs Start: 03/02/24 0738 Code Status:   Code Status: Full Code Family Communication: Wife updated over the phone  Status is: Inpatient  Dispo: The patient is from: Home              Anticipated d/c is to: Home              Anticipated d/c date is: 24 to 48 hours              Patient currently not medically stable for discharge  Consultants:  None  Procedures:  None  Antimicrobials:  Ceftriaxone  Subjective: No acute issues or events overnight, mental status improving but not yet back to baseline.  Patient denies nausea vomiting diarrhea constipation any fevers chills or chest pain  Objective: Vitals:   03/02/24 1229 03/02/24 1638 03/02/24 2035 03/03/24 0459  BP: 131/88 131/81 128/79 108/62  Pulse: (!) 107 71 85 65  Resp: 19 19 19 16   Temp: 98.3 F (36.8 C) (!) 97.5 F (36.4 C) 98.1 F (36.7 C) 97.8 F (36.6 C)  TempSrc: Oral Oral Oral Oral  SpO2: 96% 97% 100% 94%  Weight:      Height:        Intake/Output Summary (Last 24 hours) at 03/03/2024 0826 Last data filed at 03/03/2024 0600 Gross per 24 hour  Intake 916.91 ml  Output 850 ml  Net 66.91 ml   Filed Weights   03/01/24 2126  Weight: 81.6 kg    Examination:  General:  Pleasantly resting in bed, No acute distress.  Alert to person and general situation only HEENT:  Normocephalic atraumatic.  Sclerae nonicteric, noninjected.  Extraocular movements intact bilaterally. Neck:  Without mass or deformity.  Trachea is midline. Lungs:  Clear to auscultate bilaterally without rhonchi,  wheeze, or rales. Heart:  Regular rate and rhythm.  Without murmurs, rubs, or gallops. Abdomen:  Soft, nontender, nondistended.  Without guarding or rebound. Extremities: Without cyanosis, clubbing, edema, or obvious deformity. Skin:  Warm and dry, no erythema.  Data Reviewed: I have personally reviewed following labs and imaging studies  CBC: Recent Labs  Lab 03/01/24 2145  WBC 18.8*   NEUTROABS 15.7*  HGB 12.6*  HCT 37.4*  MCV 95.4  PLT 137*   Basic Metabolic Panel: Recent Labs  Lab 03/01/24 2145  NA 138  K 3.4*  CL 101  CO2 25  GLUCOSE 143*  BUN 19  CREATININE 1.11  CALCIUM  9.3   GFR: Estimated Creatinine Clearance: 52.5 mL/min (by C-G formula based on SCr of 1.11 mg/dL). Liver Function Tests: Recent Labs  Lab 03/01/24 2145  AST 22  ALT 7  ALKPHOS 44  BILITOT 1.0  PROT 6.0*  ALBUMIN 3.4*   No results for input(s): "LIPASE", "AMYLASE" in the last 168 hours. No results for input(s): "AMMONIA" in the last 168 hours. Coagulation Profile: Recent Labs  Lab 03/01/24 2145  INR 1.1   Cardiac Enzymes: No results for input(s): "CKTOTAL", "CKMB", "CKMBINDEX", "TROPONINI" in the last 168 hours. BNP (last 3 results) No results for input(s): "PROBNP" in the last 8760 hours. HbA1C: No results for input(s): "HGBA1C" in the last 72 hours. CBG: No results for input(s): "GLUCAP" in the last 168 hours. Lipid Profile: No results for input(s): "CHOL", "HDL", "LDLCALC", "TRIG", "CHOLHDL", "LDLDIRECT" in the last 72 hours. Thyroid  Function Tests: No results for input(s): "TSH", "T4TOTAL", "FREET4", "T3FREE", "THYROIDAB" in the last 72 hours. Anemia Panel: No results for input(s): "VITAMINB12", "FOLATE", "FERRITIN", "TIBC", "IRON", "RETICCTPCT" in the last 72 hours. Sepsis Labs: Recent Labs  Lab 03/01/24 2153 03/01/24 2340  LATICACIDVEN 2.4* 0.9    Recent Results (from the past 240 hours)  Resp panel by RT-PCR (RSV, Flu A&B, Covid)     Status: None   Collection Time: 03/01/24  9:29 PM   Specimen: Nasal Swab  Result Value Ref Range Status   SARS Coronavirus 2 by RT PCR NEGATIVE NEGATIVE Final   Influenza A by PCR NEGATIVE NEGATIVE Final   Influenza B by PCR NEGATIVE NEGATIVE Final    Comment: (NOTE) The Xpert Xpress SARS-CoV-2/FLU/RSV plus assay is intended as an aid in the diagnosis of influenza from Nasopharyngeal swab specimens and should not be  used as a sole basis for treatment. Nasal washings and aspirates are unacceptable for Xpert Xpress SARS-CoV-2/FLU/RSV testing.  Fact Sheet for Patients: BloggerCourse.com  Fact Sheet for Healthcare Providers: SeriousBroker.it  This test is not yet approved or cleared by the [place]  FDA and has been authorized for detection and/or diagnosis of SARS-CoV-2 by FDA under an Emergency Use Authorization (EUA). This EUA will remain in effect (meaning this test can be used) for the duration of the COVID-19 declaration under Section 564(b)(1) of the Act, 21 U.S.C. section 360bbb-3(b)(1), unless the authorization is terminated or revoked.     Resp Syncytial Virus by PCR NEGATIVE NEGATIVE Final    Comment: (NOTE) Fact Sheet for Patients: BloggerCourse.com  Fact Sheet for Healthcare Providers: SeriousBroker.it  This test is not yet approved or cleared by the [place]  FDA and has been authorized for detection and/or diagnosis of SARS-CoV-2 by FDA under an Emergency Use Authorization (EUA). This EUA will remain in effect (meaning this test can be used) for the duration of the COVID-19 declaration under Section 564(b)(1) of the Act, 21 U.S.C.  section 360bbb-3(b)(1), unless the authorization is terminated or revoked.  Performed at [facility], [address] [place], [place] [address]   Culture, blood (Routine x 2)     Status: None (Preliminary result)   Collection Time: 03/01/24  9:45 PM   Specimen: BLOOD RIGHT FOREARM  Result Value Ref Range Status   Specimen Description BLOOD RIGHT FOREARM  Final   Special Requests   Final    BOTTLES DRAWN AEROBIC AND ANAEROBIC Blood Culture results may not be optimal due to an inadequate volume of blood received in culture bottles   Culture   Final    NO GROWTH 2 DAYS Performed at [facility], [address], [place],  [place] [address]    Report Status PENDING  Incomplete  Culture, blood (Routine x 2)     Status: None (Preliminary result)   Collection Time: 03/01/24 10:15 PM   Specimen: BLOOD  Result Value Ref Range Status   Specimen Description BLOOD RIGHT ANTECUBITAL  Final   Special Requests   Final    BOTTLES DRAWN AEROBIC AND ANAEROBIC Blood Culture adequate volume   Culture  Setup Time   Final    GRAM NEGATIVE RODS IN BOTH AEROBIC AND ANAEROBIC BOTTLES CRITICAL RESULT CALLED TO, READ BACK BY AND VERIFIED WITH: PHARMD M BELL [id number] AT 1213 BY CM Performed at [facility], [address], [place], [place] [address]    Culture GRAM NEGATIVE RODS  Final   Report Status PENDING  Incomplete  Blood Culture ID Panel (Reflexed)     Status: Abnormal   Collection Time: 03/01/24 10:15 PM  Result Value Ref Range Status   Enterococcus faecalis NOT DETECTED NOT DETECTED Final   Enterococcus Faecium NOT DETECTED NOT DETECTED Final   Listeria monocytogenes NOT DETECTED NOT DETECTED Final   Staphylococcus species NOT DETECTED NOT DETECTED Final   Staphylococcus aureus (BCID) NOT DETECTED NOT DETECTED Final   Staphylococcus epidermidis NOT DETECTED NOT DETECTED Final   Staphylococcus lugdunensis NOT DETECTED NOT DETECTED Final   Streptococcus species NOT DETECTED NOT DETECTED Final   Streptococcus agalactiae NOT DETECTED NOT DETECTED Final   Streptococcus pneumoniae NOT DETECTED NOT DETECTED Final   Streptococcus pyogenes NOT DETECTED NOT DETECTED Final   A.calcoaceticus-baumannii NOT DETECTED NOT DETECTED Final   Bacteroides fragilis NOT DETECTED NOT DETECTED Final   Enterobacterales DETECTED (A) NOT DETECTED Final    Comment: Enterobacterales represent a large order of gram negative bacteria, not a single organism. CRITICAL RESULT CALLED TO, READ BACK BY AND VERIFIED WITH: PHARMD M BELL [id number] AT 1213 BY CM    Enterobacter cloacae complex NOT DETECTED NOT DETECTED Final   Escherichia coli DETECTED (A)  NOT DETECTED Final    Comment: CRITICAL RESULT CALLED TO, READ BACK BY AND VERIFIED WITH: PHARMD M BELL [id number] AT 1213 BY CM    Klebsiella aerogenes NOT DETECTED NOT DETECTED Final   Klebsiella oxytoca NOT DETECTED NOT DETECTED Final   Klebsiella pneumoniae NOT DETECTED NOT DETECTED Final   Proteus species NOT DETECTED NOT DETECTED Final   Salmonella species NOT DETECTED NOT DETECTED Final   Serratia marcescens NOT DETECTED NOT DETECTED Final   Haemophilus influenzae NOT DETECTED NOT DETECTED Final   Neisseria meningitidis NOT DETECTED NOT DETECTED Final   Pseudomonas aeruginosa NOT DETECTED NOT DETECTED Final   Stenotrophomonas maltophilia NOT DETECTED NOT DETECTED Final   Candida albicans NOT DETECTED NOT DETECTED Final   Candida auris NOT DETECTED NOT DETECTED Final  Candida glabrata NOT DETECTED NOT DETECTED Final   Candida krusei NOT DETECTED NOT DETECTED Final   Candida parapsilosis NOT DETECTED NOT DETECTED Final   Candida tropicalis NOT DETECTED NOT DETECTED Final   Cryptococcus neoformans/gattii NOT DETECTED NOT DETECTED Final   CTX-M ESBL NOT DETECTED NOT DETECTED Final   Carbapenem resistance IMP NOT DETECTED NOT DETECTED Final   Carbapenem resistance KPC NOT DETECTED NOT DETECTED Final   Carbapenem resistance NDM NOT DETECTED NOT DETECTED Final   Carbapenem resist OXA 48 LIKE NOT DETECTED NOT DETECTED Final   Carbapenem resistance VIM NOT DETECTED NOT DETECTED Final    Comment: Performed at [facility], [address], [place], [place] [address]  Urine Culture     Status: Abnormal   Collection Time: 03/02/24 12:33 AM   Specimen: Urine, Random  Result Value Ref Range Status   Specimen Description URINE, RANDOM  Final   Special Requests NONE Reflexed from [id number]  Final   Culture (A)  Final    <10,000 COLONIES/mL INSIGNIFICANT GROWTH Performed at [facility], [address], [place], [place] [address]    Report Status 03/02/2024 FINAL  Final          Radiology Studies: CT HEAD WO CONTRAST ( ) Result Date: 03/02/2024 CLINICAL DATA:  Delirium. EXAM: CT HEAD WITHOUT CONTRAST TECHNIQUE: Contiguous axial images were obtained from the base of the skull through the vertex without intravenous contrast. RADIATION DOSE REDUCTION: This exam was performed according to the departmental dose-optimization program which includes automated exposure control, adjustment of the mA and/or kV according to patient size and/or use of iterative reconstruction technique. COMPARISON:  12/23/2023 FINDINGS: Brain: There is no evidence for acute hemorrhage, hydrocephalus, mass lesion, or abnormal extra-axial fluid collection. No definite CT evidence for acute infarction. Diffuse loss of parenchymal volume is consistent with atrophy. Patchy low attenuation in the deep hemispheric and periventricular white matter is nonspecific, but likely reflects chronic microvascular ischemic demyelination. As noted on prior studies, enlargement of the lateral ventricles is at upper portion to the degree of atrophy. 8 mm extra-axial left frontal dural-based lesion is similar to prior and comparing back to an older study from 10/06/2023, compatible with small meningioma. Vascular: No hyperdense vessel or unexpected calcification. Skull: No evidence for fracture. No worrisome lytic or sclerotic lesion. Sinuses/Orbits: The visualized paranasal sinuses and mastoid air cells are clear. Visualized portions of the globes and intraorbital fat are unremarkable. Other: None. IMPRESSION: 1. No acute intracranial abnormality. 2. Atrophy with chronic small vessel ischemic disease. 3. Ventriculomegaly, similar to prior and potentially related to central atrophy but component of underlying hydrocephalus not excluded. 4. 8 mm extra-axial left frontal dural-based lesion is similar to prior and comparing back to an older study from 10/06/2023, compatible with small meningioma. Electronically Signed   By: Donnal Fusi M.D.   On: 03/02/2024 08:10   DG Chest 2 View Result Date: 03/01/2024 CLINICAL DATA:  Generalized weakness and decreased appetite EXAM: CHEST - 2 VIEW COMPARISON:  12/23/2023 FINDINGS: Stable cardiomediastinal silhouette. Low lung volumes accentuate pulmonary vascularity. No focal consolidation, pleural effusion, or pneumothorax. No displaced rib fractures. IMPRESSION: Low lung volumes without acute cardiopulmonary process. Electronically Signed   By: Rozell Cornet M.D.   On: 03/01/2024 22:08    Scheduled Meds:  amiodarone   100 mg Oral Daily   carbidopa -levodopa   1 tablet Oral QHS   carbidopa -levodopa   2 tablet Oral 2 times per day   And   carbidopa -levodopa   1  tablet Oral 2 times per day   divalproex   125 mg Oral QHS   ezetimibe   10 mg Oral Daily   fesoterodine  4 mg Oral Daily   senna  1 tablet Oral BID   sertraline   100 mg Oral Daily   Continuous Infusions:  cefTRIAXone (ROCEPHIN)  IV       LOS: 1 day   Time spent:  Haydee Lipa, DO [facility]  If 7PM-7AM, please contact night-coverage [email or web address]  03/03/2024, 8:26 AM

## 2024-03-03 NOTE — Evaluation (Signed)
 Occupational Therapy Evaluation Patient Details Name: Walter Brown MRN: [id number] DOB: [birth year] Today's Date: 03/03/2024   History of Present Illness   Patient is a 83 year old male who presented on 5/26 with somnolence, fever and poor appetite. Patient was admitted with severe sepsis, UTI, acute metabolic encephalopathy, and chronic thrombocytopenia. PMHL recurrent falls, BPH, HTN, parkinson's disease, hypokalemia, COPD     Clinical Impressions Patient is a 83 year old male who was admitted for above. [name] in room able to provide PLOF with wife indicating patient is high falls risk with transfers to scooter to get around house. Patient has absent safety awareness today with physical A needed to prevent falls during session. Patient demonstrated a strong posterior lean with standing and is TD for LB ADLs at this time. Patient was noted to have decreased functional activity tolerance, decreased endurance, decreased standing balance, decreased safety awareness, and decreased knowledge of AD/AE impacting participation in ADLs. Patients wife is pending back surgery herself and is unable to offer physical A. Patient will benefit from continued inpatient follow up therapy, <3 hours/day     If plan is discharge home, recommend the following:   A lot of help with bathing/dressing/bathroom;Assistance with cooking/housework;Assist for transportation;Direct supervision/assist for medications management;Direct supervision/assist for financial management;Help with stairs or ramp for entrance;A lot of help with walking and/or transfers     Functional Status Assessment   Patient has had a recent decline in their functional status and demonstrates the ability to make significant improvements in function in a reasonable and predictable amount of time.       Precautions/Restrictions   Precautions Precautions: Fall Recall of Precautions/Restrictions: Impaired Restrictions Weight  Bearing Restrictions Per Provider Order: No     Mobility Bed Mobility Overal bed mobility: Needs Assistance             General bed mobility comments: patient was up in recliner and returned to the same.         Balance Overall balance assessment: Needs assistance Sitting-balance support: Feet supported, Bilateral upper extremity supported Sitting balance-Leahy Scale: Fair     Standing balance support: Reliant on assistive device for balance, Bilateral upper extremity supported Standing balance-Leahy Scale: Zero Standing balance comment: required physical A to maintian balance.         ADL either performed or assessed with clinical judgement   ADL Overall ADL's : Needs assistance/impaired Eating/Feeding: Set up;Supervision/ safety;Sitting   Grooming: Sitting;Minimal assistance   Upper Body Bathing: Sitting;Moderate assistance   Lower Body Bathing: Sitting/lateral leans;Total assistance   Upper Body Dressing : Sitting;Moderate assistance   Lower Body Dressing: Sitting/lateral leans;Maximal assistance   Toilet Transfer: Moderate assistance;Ambulation;Rolling walker (2 wheels) Toilet Transfer Details (indicate cue type and reason): patient requesting to use bathroom upon entrance to room. patient provided with RW with patient having LOB into chair with strong posterior lean. patient provided with physical A to had safe landing into recliner. patient had no warning or awareness of LOB. patient's wife present in room. patient was mod A to trnasfer from recliner in room to bathroom with cues for proper positioning. patient having difficult time with side stepping with patient unable to process without continued multimodal cues. Toileting- Architect and Hygiene: Sit to/from stand;Total assistance Toileting - Clothing Manipulation Details (indicate cue type and reason): with mod A for physical standing with posterior leaning.             Vision   Vision  Assessment?: No apparent visual deficits  Pertinent Vitals/Pain Pain Assessment Pain Assessment: No/denies pain     Extremity/Trunk Assessment Upper Extremity Assessment Upper Extremity Assessment: Generalized weakness;Difficult to assess due to impaired cognition   Lower Extremity Assessment Lower Extremity Assessment: Defer to PT evaluation       Communication     Cognition Arousal: Lethargic Behavior During Therapy: Flat affect Cognition: Difficult to assess             OT - Cognition Comments: patient was a poor historian with wife in room having difficulty communicating PLOF as well. patient was able to follow some commands appropriately. patient has no safety awareness.                 Following commands: Impaired Following commands impaired: Follows one step commands inconsistently     Cueing  General Comments   Cueing Techniques: Gestural cues;Verbal cues;Tactile cues              Home Living Family/patient expects to be discharged to:: Private residence Living Arrangements: Spouse/significant other Available Help at Discharge: Family;Available PRN/intermittently Type of Home: House Home Access: Ramped entrance     Home Layout: One level     Bathroom Shower/Tub: Walk-in shower         Home Equipment: Shower seat;Electric scooter;Grab bars - tub/shower;Grab bars - toilet          Prior Functioning/Environment Prior Level of Function : Needs assist               ADLs Comments: patient was able to complete toileting transfers independently with electric scooter.    OT Problem List: Decreased cognition;Decreased knowledge of precautions;Impaired balance (sitting and/or standing);Decreased safety awareness;Decreased knowledge of use of DME or AE;Decreased activity tolerance   OT Treatment/Interventions: Self-care/ADL training;DME and/or AE instruction;Therapeutic activities;Balance training;Therapeutic exercise;Energy  conservation;Patient/family education      OT Goals(Current goals can be found in the care plan section)   Acute Rehab OT Goals Patient Stated Goal: none stated OT Goal Formulation: With patient/family Time For Goal Achievement: 03/17/24 Potential to Achieve Goals: Fair   OT Frequency:  Min 2X/week       AM-PAC OT "6 Clicks" Daily Activity     Outcome Measure Help from another person eating meals?: A Little Help from another person taking care of personal grooming?: A Little Help from another person toileting, which includes using toliet, bedpan, or urinal?: A Lot Help from another person bathing (including washing, rinsing, drying)?: A Lot Help from another person to put on and taking off regular upper body clothing?: A Little Help from another person to put on and taking off regular lower body clothing?: A Lot 6 Click Score: 15   End of Session Equipment Utilized During Treatment: Gait belt;Rolling walker (2 wheels)  Activity Tolerance: Patient limited by fatigue Patient left: in chair;with call bell/phone within reach;with chair alarm set  OT Visit Diagnosis: Unsteadiness on feet (R26.81);Other abnormalities of gait and mobility (R26.89);History of falling (Z91.81);Repeated falls (R29.6);Muscle weakness (generalized) (M62.81);Other symptoms and signs involving cognitive function                Time: 1121-1147 OT Time Calculation (min): 26 min Charges:  OT General Charges $OT Visit: 1 Visit OT Evaluation $OT Eval Low Complexity: 1 Low OT Treatments $Self Care/Home Management : 8-22 mins  Wynette Heckler, MS Acute Rehabilitation Department Office# [phone]   Jame Maze 03/03/2024, 2:11 PM

## 2024-03-03 NOTE — Evaluation (Signed)
 Physical Therapy Evaluation Patient Details Name: Walter Brown MRN: [id number] DOB: March 30, 1941 Today's Date: 03/03/2024  History of Present Illness  Patient is a 83 year old male who presented on 5/26 with somnolence, fever and poor appetite. Patient was admitted with severe sepsis, UTI, acute metabolic encephalopathy, and chronic thrombocytopenia. PMHL recurrent falls, BPH, HTN, parkinson's disease, hypokalemia, COPD  Clinical Impression  Pt admitted with above diagnosis.  Pt currently with functional limitations due to the deficits listed below (see PT Problem List). Pt will benefit from acute skilled PT to increase their independence and safety with mobility to allow discharge.     The patient presents with significant difference in function than earlier today with OT where he was able to ambulate in the room  using RW This visit at 1600, patient demonstrating poor  sitting balance and movements very  tardive. Patient listing to the left when sitting. Patient was unable to safely pivot to recliner. Patient had  last sinemet  at 1457.  [name] not present but according to OT report, wife reports that she is having difficulty managing  patient at home.  Patient transfers only at baseline due to fall risk with ambulation.  Patients PCA present and stated that patient is usually able to transfer and get his own shower in AM, reports that patient has more difficulty with mobility at the "end of the day".  Patient will benefit from continued inpatient follow up therapy, <3 hours/day     If plan is discharge home, recommend the following: Two people to help with walking and/or transfers;A lot of help with bathing/dressing/bathroom;Assistance with cooking/housework;Assist for transportation;Help with stairs or ramp for entrance   Can travel by private vehicle    no    Equipment Recommendations None recommended by PT  Recommendations for Other Services       Functional Status Assessment  Patient has had a recent decline in their functional status and demonstrates the ability to make significant improvements in function in a reasonable and predictable amount of time.     Precautions / Restrictions Precautions Precautions: Fall Recall of Precautions/Restrictions: Impaired Restrictions Weight Bearing Restrictions Per Provider Order: No      Mobility  Bed Mobility Overal bed mobility: Needs Assistance Bed Mobility: Supine to Sit, Sit to Supine, Rolling Rolling: Contact guard assist   Supine to sit: Max assist Sit to supine: Max assist   General bed mobility comments: slow to roll and move legs over bed edge, max support to sit upright, then propped head on rail when trying to reach for recliner arm, assist legs back into bed    Transfers                   General transfer comment: unable to safely  get patient to pivot to recliner, assisted back into bed.    Ambulation/Gait                  Stairs            Wheelchair Mobility     Tilt Bed    Modified Rankin (Stroke Patients Only)       Balance Overall balance assessment: Needs assistance, History of Falls Sitting-balance support: Feet supported, Bilateral upper extremity supported Sitting balance-Leahy Scale: Poor Sitting balance - Comments: forward leaning  and to left Postural control: Left lateral lean     Standing balance comment: unable to stand  Pertinent Vitals/Pain Pain Assessment Pain Assessment: No/denies pain    Home Living Family/patient expects to be discharged to:: Private residence Living Arrangements: Spouse/significant other Available Help at Discharge: Personal care attendant Type of Home: House Home Access: Ramped entrance       Home Layout: One level Home Equipment: Shower seat;Electric scooter;Grab bars - tub/shower;Grab bars - toilet      Prior Function Prior Level of Function : Needs assist              Mobility Comments: transfers to scooter, does not amb due to falls, ADLs Comments: patient was able to complete toileting transfers independently with electric scooter. and shower with no assist(per sitter)     Extremity/Trunk Assessment   Upper Extremity Assessment Upper Extremity Assessment: Generalized weakness    Lower Extremity Assessment Lower Extremity Assessment: RLE deficits/detail;LLE deficits/detail RLE Deficits / Details: slow movements, tends to be  stiff in legs    Cervical / Trunk Assessment Cervical / Trunk Assessment: Other exceptions Cervical / Trunk Exceptions: forward flexed when seated on bed  Communication        Cognition Arousal: Alert     PT - Cognitive impairments: Attention, Awareness, Safety/Judgement                       PT - Cognition Comments: patient kept trying to stand with right hand on recliner and not being able to pivot Following commands: Impaired Following commands impaired: Follows one step commands inconsistently     Cueing Cueing Techniques: Gestural cues, Verbal cues, Tactile cues     General Comments      Exercises     Assessment/Plan    PT Assessment Patient needs continued PT services  PT Problem List Decreased activity tolerance;Decreased mobility;Decreased safety awareness;Decreased knowledge of precautions       PT Treatment Interventions DME instruction;Therapeutic activities;Cognitive remediation;Gait training;Functional mobility training;Therapeutic exercise;Patient/family education    PT Goals (Current goals can be found in the Care Plan section)  Acute Rehab PT Goals Patient Stated Goal: to go home PT Goal Formulation: With patient/family Time For Goal Achievement: 03/17/24 Potential to Achieve Goals: Fair    Frequency Min 2X/week     Co-evaluation               AM-PAC PT "6 Clicks" Mobility  Outcome Measure Help needed turning from your back to your side while in a flat bed  without using bedrails?: A Lot Help needed moving from lying on your back to sitting on the side of a flat bed without using bedrails?: Total Help needed moving to and from a bed to a chair (including a wheelchair)?: Total Help needed standing up from a chair using your arms (e.g., wheelchair or bedside chair)?: Total Help needed to walk in hospital room?: Total Help needed climbing 3-5 steps with a railing? : Total 6 Click Score: 7    End of Session Equipment Utilized During Treatment: Gait belt Activity Tolerance: Patient limited by fatigue;Treatment limited secondary to medical complications (Comment) Patient left: in bed;with call bell/phone within reach;with bed alarm set;with family/visitor present Nurse Communication: Mobility status PT Visit Diagnosis: Unsteadiness on feet (R26.81);Other abnormalities of gait and mobility (R26.89);Difficulty in walking, not elsewhere classified (R26.2);Other symptoms and signs involving the nervous system (R29.898)    Time: 1610-9604 PT Time Calculation (min) (ACUTE ONLY): 23 min   Charges:   PT Evaluation $PT Eval Low Complexity: 1 Low PT Treatments $Therapeutic Activity: 8-22 mins PT General Charges $$  ACUTE PT VISIT: 1 Visit         Abelina Hoes PT Acute Rehabilitation Services Office [phone]   Dareen Ebbing 03/03/2024, 4:47 PM

## 2024-03-03 NOTE — Discharge Summary (Signed)
 Physician Discharge Summary  STRYKER VEASEY AOZ:[id number] DOB: August 25, 1941 DOA: 03/01/2024  PCP: Neda Balk, MD  Admit date: 03/01/2024 Discharge date: 03/04/2024  Admitted From: Home Disposition:  Home  Recommendations for Outpatient Follow-up:  Follow up with PCP on Monday as scheduled  Home Health: PT OT Equipment/Devices: None  Discharge Condition: Stable CODE STATUS: Full Diet recommendation: As tolerated  Brief/Interim Summary: Walter Brown is an 83 y.o. male past medical history of Parkinson's disease, normal pressure hydrocephalus, recurrent falls, paroxysmal atrial fibrillation on Eliquis  is brought into the ED for somnolence poor appetite and a temperature of 101, white count of 18,000 UA was positive for nitrates and bacteria urine cultures were sent he was started empirically on antibiotics chest x-ray showed no acute findings.  Patient admitted as above with acute encephalopathy and severe sepsis in the setting of UTI with lactic acidosis.  Sepsis criteria resolved quite quickly, mental status is well improved back to baseline, wife at bedside today indicates patient is markedly improved from prior.  Will continue antibiotics with cefadroxil at discharge to complete 5-day course given patient's age and complications.  Continue home medications as below, no changes unless otherwise specified.  Patient's other chronic comorbid conditions appear well-controlled at this time.  Discharge Diagnoses:  Principal Problem:   UTI (urinary tract infection) Active Problems:   Thrombocytopenia   Essential hypertension   BPH (benign prostatic hyperplasia)   PAF (paroxysmal atrial fibrillation)   Parkinson's disease (HCC)   Recurrent falls   Severe sepsis (HCC)   Acute metabolic encephalopathy   Hypokalemia    Discharge Instructions  Discharge Instructions     Call MD for:   Complete by: As directed    Worsening mental status   Call MD for:  extreme fatigue   Complete  by: As directed    Call MD for:  temperature >100.4   Complete by: As directed    Diet - low sodium heart healthy   Complete by: As directed    Discharge instructions   Complete by: As directed    Follow-up with your PCP on Monday as scheduled   Face-to-face encounter (required for Medicare/Medicaid patients)   Complete by: As directed    I Haydee Lipa certify that this patient is under my care and that I, or a nurse practitioner or physician's assistant working with me, had a face-to-face encounter that meets the physician face-to-face encounter requirements with this patient on 03/04/2024. The encounter with the patient was in whole, or in part for the following medical condition(s) which is the primary reason for home health care (List medical condition): Ambulatory dysfunction, fall   The encounter with the patient was in whole, or in part, for the following medical condition, which is the primary reason for home health care: Ambulatory dysfunction, fall   I certify that, based on my findings, the following services are medically necessary home health services: Physical therapy   Reason for Medically Necessary Home Health Services:  Skilled Nursing- Change/Decline in Patient Status Therapy- Investment banker, operational, Transfer Training and Stair Training     My clinical findings support the need for the above services: Unable to leave home safely without assistance and/or assistive device   Further, I certify that my clinical findings support that this patient is homebound due to: Unable to leave home safely without assistance   Home Health   Complete by: As directed    To provide the following care/treatments:  PT OT Social work  Increase activity slowly   Complete by: As directed       Allergies as of 03/04/2024       Reactions   Albuterol  Sulfate Palpitations   Azithromycin Other (See Comments)   Hepatotoxicity   Doxycycline Hyclate Other (See Comments)   Taken with  Azithromycin and had Heaptotoxicity        Medication List     TAKE these medications    acetaminophen  500 MG tablet Commonly known as: TYLENOL  Take 2 tablets (1,000 mg total) by mouth every 6 (six) hours as needed for moderate pain (pain score 4-6).   amiodarone  200 MG tablet Commonly known as: PACERONE  TAKE 1/2 TABLET BY MOUTH DAILY   benzonatate  100 MG capsule Commonly known as: TESSALON  TAKE 1-2 CAPSULES (100-200 MG TOTAL) BY MOUTH 2 (TWO) TIMES DAILY AS NEEDED FOR COUGH.   budesonide -formoterol  160-4.5 MCG/ACT inhaler Commonly known as: SYMBICORT  Inhale 2 puffs into the lungs 2 (two) times daily.   carbidopa -levodopa  50-200 MG tablet Commonly known as: SINEMET  CR TAKE 1 TABLET BY MOUTH EVERYDAY AT BEDTIME   carbidopa -levodopa  25-100 MG tablet Commonly known as: SINEMET  IR 2 AT 7AM, 2 AT 11AM, Take  1 AT 3PM, Take 1 at 7 PM   cefadroxil 500 MG capsule Commonly known as: DURICEF Take 2 capsules (1,000 mg total) by mouth 2 (two) times daily for 2 days.   cholecalciferol 25 MCG (1000 UNIT) tablet Commonly known as: VITAMIN D3 Take 2,000 Units by mouth daily.   divalproex  125 MG DR tablet Commonly known as: DEPAKOTE  TAKE 1 TABLET (125 MG TOTAL) BY MOUTH AT BEDTIME   ezetimibe  10 MG tablet Commonly known as: ZETIA  TAKE 1 TABLET BY MOUTH EVERY DAY   guaifenesin  100 MG/5ML syrup Commonly known as: ROBITUSSIN Take 10 mLs (200 mg total) by mouth 3 (three) times daily as needed for cough.   hydrochlorothiazide  25 MG tablet Commonly known as: HYDRODIURIL  TAKE 1 TABLET BY MOUTH EVERY DAY   ICAPS AREDS 2 PO Take 1 capsule by mouth in the morning and at bedtime.   levalbuterol  45 MCG/ACT inhaler Commonly known as: XOPENEX  HFA INHALE 1 TO 2 PUFFS BY MOUTH EVERY 6 HOURS AS NEEDED FOR WHEEZE   Melatonin 10 MG Tabs Take 10 mg by mouth daily as needed.   Myrbetriq 50 MG Tb24 tablet Generic drug: mirabegron ER Take 50 mg by mouth daily.   pantoprazole  40 MG  tablet Commonly known as: PROTONIX  Take 1 tablet (40 mg total) by mouth daily as needed.   senna 8.6 MG Tabs tablet Commonly known as: SENOKOT Take 1 tablet by mouth in the morning and at bedtime.   sertraline  100 MG tablet Commonly known as: ZOLOFT  TAKE 1 TABLET BY MOUTH EVERY DAY   solifenacin 5 MG tablet Commonly known as: VESICARE Take 5 mg by mouth daily.        Follow-up Information     [facility], [facility] Follow up.   Specialty: Home Health Services Why: PT/OT/ social work Solicitor information: [address] [place] [address] [phone]                Allergies  Allergen Reactions   Albuterol  Sulfate Palpitations   Azithromycin Other (See Comments)    Hepatotoxicity   Doxycycline Hyclate Other (See Comments)    Taken with Azithromycin and had Heaptotoxicity    Consultations: None  Procedures/Studies: CT HEAD WO CONTRAST ( ) Result Date: 03/02/2024 CLINICAL DATA:  Delirium. EXAM: CT HEAD WITHOUT CONTRAST  TECHNIQUE: Contiguous axial images were obtained from the base of the skull through the vertex without intravenous contrast. RADIATION DOSE REDUCTION: This exam was performed according to the departmental dose-optimization program which includes automated exposure control, adjustment of the mA and/or kV according to patient size and/or use of iterative reconstruction technique. COMPARISON:  12/23/2023 FINDINGS: Brain: There is no evidence for acute hemorrhage, hydrocephalus, mass lesion, or abnormal extra-axial fluid collection. No definite CT evidence for acute infarction. Diffuse loss of parenchymal volume is consistent with atrophy. Patchy low attenuation in the deep hemispheric and periventricular white matter is nonspecific, but likely reflects chronic microvascular ischemic demyelination. As noted on prior studies, enlargement of the lateral ventricles is at upper portion to the degree of atrophy. 8 mm extra-axial left frontal  dural-based lesion is similar to prior and comparing back to an older study from 10/06/2023, compatible with small meningioma. Vascular: No hyperdense vessel or unexpected calcification. Skull: No evidence for fracture. No worrisome lytic or sclerotic lesion. Sinuses/Orbits: The visualized paranasal sinuses and mastoid air cells are clear. Visualized portions of the globes and intraorbital fat are unremarkable. Other: None. IMPRESSION: 1. No acute intracranial abnormality. 2. Atrophy with chronic small vessel ischemic disease. 3. Ventriculomegaly, similar to prior and potentially related to central atrophy but component of underlying hydrocephalus not excluded. 4. 8 mm extra-axial left frontal dural-based lesion is similar to prior and comparing back to an older study from 10/06/2023, compatible with small meningioma. Electronically Signed   By: Donnal Fusi M.D.   On: 03/02/2024 08:10   DG Chest 2 View Result Date: 03/01/2024 CLINICAL DATA:  Generalized weakness and decreased appetite EXAM: CHEST - 2 VIEW COMPARISON:  12/23/2023 FINDINGS: Stable cardiomediastinal silhouette. Low lung volumes accentuate pulmonary vascularity. No focal consolidation, pleural effusion, or pneumothorax. No displaced rib fractures. IMPRESSION: Low lung volumes without acute cardiopulmonary process. Electronically Signed   By: Rozell Cornet M.D.   On: 03/01/2024 22:08     Subjective: No acute issues or events overnight   Discharge Exam: Vitals:   03/04/24 0655 03/04/24 1233  BP: (!) 168/91 122/82  Pulse: 89 79  Resp: 16 20  Temp: 97.7 F (36.5 C) 97.8 F (36.6 C)  SpO2: 98% 99%   Vitals:   03/03/24 2054 03/04/24 0654 03/04/24 0655 03/04/24 1233  BP: (!) 152/97 (!) 179/91 (!) 168/91 122/82  Pulse: 70 67 89 79  Resp: 16 16 16 20   Temp: 98 F (36.7 C) 97.7 F (36.5 C) 97.7 F (36.5 C) 97.8 F (36.6 C)  TempSrc: Oral Oral Oral Oral  SpO2: 97% 98% 98% 99%  Weight:      Height:        General: Pt is  alert, awake, not in acute distress Cardiovascular: RRR, S1/S2 +, no rubs, no gallops Respiratory: CTA bilaterally, no wheezing, no rhonchi Abdominal: Soft, NT, ND, bowel sounds + Extremities: no edema, no cyanosis    The results of significant diagnostics from this hospitalization (including imaging, microbiology, ancillary and laboratory) are listed below for reference.     Microbiology: Recent Results (from the past 240 hours)  Resp panel by RT-PCR (RSV, Flu A&B, Covid)     Status: None   Collection Time: 03/01/24  9:29 PM   Specimen: Nasal Swab  Result Value Ref Range Status   SARS Coronavirus 2 by RT PCR NEGATIVE NEGATIVE Final   Influenza A by PCR NEGATIVE NEGATIVE Final   Influenza B by PCR NEGATIVE NEGATIVE Final    Comment: (NOTE) The  Xpert Xpress SARS-CoV-2/FLU/RSV plus assay is intended as an aid in the diagnosis of influenza from Nasopharyngeal swab specimens and should not be used as a sole basis for treatment. Nasal washings and aspirates are unacceptable for Xpert Xpress SARS-CoV-2/FLU/RSV testing.  Fact Sheet for Patients: BloggerCourse.com  Fact Sheet for Healthcare Providers: SeriousBroker.it  This test is not yet approved or cleared by the [place]  FDA and has been authorized for detection and/or diagnosis of SARS-CoV-2 by FDA under an Emergency Use Authorization (EUA). This EUA will remain in effect (meaning this test can be used) for the duration of the COVID-19 declaration under Section 564(b)(1) of the Act, 21 U.S.C. section 360bbb-3(b)(1), unless the authorization is terminated or revoked.     Resp Syncytial Virus by PCR NEGATIVE NEGATIVE Final    Comment: (NOTE) Fact Sheet for Patients: BloggerCourse.com  Fact Sheet for Healthcare Providers: SeriousBroker.it  This test is not yet approved or cleared by the [place]  FDA and has been  authorized for detection and/or diagnosis of SARS-CoV-2 by FDA under an Emergency Use Authorization (EUA). This EUA will remain in effect (meaning this test can be used) for the duration of the COVID-19 declaration under Section 564(b)(1) of the Act, 21 U.S.C. section 360bbb-3(b)(1), unless the authorization is terminated or revoked.  Performed at [facility], [address], [place], [place] [address]   Culture, blood (Routine x 2)     Status: None (Preliminary result)   Collection Time: 03/01/24  9:45 PM   Specimen: BLOOD RIGHT FOREARM  Result Value Ref Range Status   Specimen Description BLOOD RIGHT FOREARM  Final   Special Requests   Final    BOTTLES DRAWN AEROBIC AND ANAEROBIC Blood Culture results may not be optimal due to an inadequate volume of blood received in culture bottles   Culture   Final    NO GROWTH 3 DAYS Performed at [facility], [address], [place], [place] [address]    Report Status PENDING  Incomplete  Culture, blood (Routine x 2)     Status: Abnormal   Collection Time: 03/01/24 10:15 PM   Specimen: BLOOD  Result Value Ref Range Status   Specimen Description BLOOD RIGHT ANTECUBITAL  Final   Special Requests   Final    BOTTLES DRAWN AEROBIC AND ANAEROBIC Blood Culture adequate volume   Culture  Setup Time   Final    GRAM NEGATIVE RODS IN BOTH AEROBIC AND ANAEROBIC BOTTLES CRITICAL RESULT CALLED TO, READ BACK BY AND VERIFIED WITH: PHARMD M BELL [id number] AT 1213 BY CM Performed at [facility], [address] [place], [place] [address]    Culture ESCHERICHIA COLI (A)  Final   Report Status 03/04/2024 FINAL  Final   Organism ID, Bacteria ESCHERICHIA COLI  Final   Organism ID, Bacteria ESCHERICHIA COLI  Final      Susceptibility   Escherichia coli - KIRBY BAUER*    CEFAZOLIN SENSITIVE Sensitive    Escherichia coli - MIC*    AMPICILLIN <=2 SENSITIVE Sensitive     CEFEPIME <=0.12 SENSITIVE Sensitive     CEFTAZIDIME <=1 SENSITIVE  Sensitive     CEFTRIAXONE <=0.25 SENSITIVE Sensitive     CIPROFLOXACIN <=0.25 SENSITIVE Sensitive     GENTAMICIN <=1 SENSITIVE Sensitive     IMIPENEM <=0.25 SENSITIVE Sensitive     TRIMETH/SULFA <=20 SENSITIVE Sensitive     AMPICILLIN/SULBACTAM <=2 SENSITIVE Sensitive     PIP/TAZO <=4 SENSITIVE Sensitive ug/mL    *  ESCHERICHIA COLI    ESCHERICHIA COLI  Blood Culture ID Panel (Reflexed)     Status: Abnormal   Collection Time: 03/01/24 10:15 PM  Result Value Ref Range Status   Enterococcus faecalis NOT DETECTED NOT DETECTED Final   Enterococcus Faecium NOT DETECTED NOT DETECTED Final   Listeria monocytogenes NOT DETECTED NOT DETECTED Final   Staphylococcus species NOT DETECTED NOT DETECTED Final   Staphylococcus aureus (BCID) NOT DETECTED NOT DETECTED Final   Staphylococcus epidermidis NOT DETECTED NOT DETECTED Final   Staphylococcus lugdunensis NOT DETECTED NOT DETECTED Final   Streptococcus species NOT DETECTED NOT DETECTED Final   Streptococcus agalactiae NOT DETECTED NOT DETECTED Final   Streptococcus pneumoniae NOT DETECTED NOT DETECTED Final   Streptococcus pyogenes NOT DETECTED NOT DETECTED Final   A.calcoaceticus-baumannii NOT DETECTED NOT DETECTED Final   Bacteroides fragilis NOT DETECTED NOT DETECTED Final   Enterobacterales DETECTED (A) NOT DETECTED Final    Comment: Enterobacterales represent a large order of gram negative bacteria, not a single organism. CRITICAL RESULT CALLED TO, READ BACK BY AND VERIFIED WITH: PHARMD M BELL [id number] AT 1213 BY CM    Enterobacter cloacae complex NOT DETECTED NOT DETECTED Final   Escherichia coli DETECTED (A) NOT DETECTED Final    Comment: CRITICAL RESULT CALLED TO, READ BACK BY AND VERIFIED WITH: PHARMD M BELL [id number] AT 1213 BY CM    Klebsiella aerogenes NOT DETECTED NOT DETECTED Final   Klebsiella oxytoca NOT DETECTED NOT DETECTED Final   Klebsiella pneumoniae NOT DETECTED NOT DETECTED Final   Proteus species NOT DETECTED NOT  DETECTED Final   Salmonella species NOT DETECTED NOT DETECTED Final   Serratia marcescens NOT DETECTED NOT DETECTED Final   Haemophilus influenzae NOT DETECTED NOT DETECTED Final   Neisseria meningitidis NOT DETECTED NOT DETECTED Final   Pseudomonas aeruginosa NOT DETECTED NOT DETECTED Final   Stenotrophomonas maltophilia NOT DETECTED NOT DETECTED Final   Candida albicans NOT DETECTED NOT DETECTED Final   Candida auris NOT DETECTED NOT DETECTED Final   Candida glabrata NOT DETECTED NOT DETECTED Final   Candida krusei NOT DETECTED NOT DETECTED Final   Candida parapsilosis NOT DETECTED NOT DETECTED Final   Candida tropicalis NOT DETECTED NOT DETECTED Final   Cryptococcus neoformans/gattii NOT DETECTED NOT DETECTED Final   CTX-M ESBL NOT DETECTED NOT DETECTED Final   Carbapenem resistance IMP NOT DETECTED NOT DETECTED Final   Carbapenem resistance KPC NOT DETECTED NOT DETECTED Final   Carbapenem resistance NDM NOT DETECTED NOT DETECTED Final   Carbapenem resist OXA 48 LIKE NOT DETECTED NOT DETECTED Final   Carbapenem resistance VIM NOT DETECTED NOT DETECTED Final    Comment: Performed at [facility], [address] [place], [place] [address]  Urine Culture     Status: Abnormal   Collection Time: 03/02/24 12:33 AM   Specimen: Urine, Random  Result Value Ref Range Status   Specimen Description URINE, RANDOM  Final   Special Requests NONE Reflexed from [id number]  Final   Culture (A)  Final    <10,000 COLONIES/mL INSIGNIFICANT GROWTH Performed at [facility], [address] [place], [place] [address]    Report Status 03/02/2024 FINAL  Final     Labs: BNP (last 3 results) Recent Labs    03/01/24 2145  BNP 155.3*   Basic Metabolic Panel: Recent Labs  Lab 03/01/24 2145 03/04/24 0505  NA 138 136  K 3.4* 3.4*  CL 101 103  CO2 25 24  GLUCOSE 143* 88  BUN 19 15  CREATININE 1.11 1.09  CALCIUM  9.3 8.4*   Liver Function Tests: Recent Labs  Lab 03/01/24 2145  AST  22  ALT 7  ALKPHOS 44  BILITOT 1.0  PROT 6.0*  ALBUMIN 3.4*   No results for input(s): "LIPASE", "AMYLASE" in the last 168 hours. No results for input(s): "AMMONIA" in the last 168 hours. CBC: Recent Labs  Lab 03/01/24 2145 03/04/24 0505  WBC 18.8* 7.3  NEUTROABS 15.7*  --   HGB 12.6* 11.7*  HCT 37.4* 36.4*  MCV 95.4 100.0  PLT 137* 117*   Cardiac Enzymes: No results for input(s): "CKTOTAL", "CKMB", "CKMBINDEX", "TROPONINI" in the last 168 hours. BNP: Invalid input(s): "POCBNP" CBG: No results for input(s): "GLUCAP" in the last 168 hours. D-Dimer No results for input(s): "DDIMER" in the last 72 hours. Hgb A1c No results for input(s): "HGBA1C" in the last 72 hours. Lipid Profile No results for input(s): "CHOL", "HDL", "LDLCALC", "TRIG", "CHOLHDL", "LDLDIRECT" in the last 72 hours. Thyroid  function studies No results for input(s): "TSH", "T4TOTAL", "T3FREE", "THYROIDAB" in the last 72 hours.  Invalid input(s): "FREET3" Anemia work up No results for input(s): "VITAMINB12", "FOLATE", "FERRITIN", "TIBC", "IRON", "RETICCTPCT" in the last 72 hours. Urinalysis    Component Value Date/Time   COLORURINE YELLOW 03/02/2024 0033   APPEARANCEUR CLEAR 03/02/2024 0033   LABSPEC 1.015 03/02/2024 0033   PHURINE 6.0 03/02/2024 0033   GLUCOSEU NEGATIVE 03/02/2024 0033   GLUCOSEU NEGATIVE 03/11/2023 1210   HGBUR NEGATIVE 03/02/2024 0033   BILIRUBINUR NEGATIVE 03/02/2024 0033   KETONESUR 5 (A) 03/02/2024 0033   PROTEINUR NEGATIVE 03/02/2024 0033   UROBILINOGEN 0.2 03/11/2023 1210   NITRITE POSITIVE (A) 03/02/2024 0033   LEUKOCYTESUR SMALL (A) 03/02/2024 0033   Sepsis Labs Recent Labs  Lab 03/01/24 2145 03/04/24 0505  WBC 18.8* 7.3   Microbiology Recent Results (from the past 240 hours)  Resp panel by RT-PCR (RSV, Flu A&B, Covid)     Status: None   Collection Time: 03/01/24  9:29 PM   Specimen: Nasal Swab  Result Value Ref Range Status   SARS Coronavirus 2 by RT PCR  NEGATIVE NEGATIVE Final   Influenza A by PCR NEGATIVE NEGATIVE Final   Influenza B by PCR NEGATIVE NEGATIVE Final    Comment: (NOTE) The Xpert Xpress SARS-CoV-2/FLU/RSV plus assay is intended as an aid in the diagnosis of influenza from Nasopharyngeal swab specimens and should not be used as a sole basis for treatment. Nasal washings and aspirates are unacceptable for Xpert Xpress SARS-CoV-2/FLU/RSV testing.  Fact Sheet for Patients: BloggerCourse.com  Fact Sheet for Healthcare Providers: SeriousBroker.it  This test is not yet approved or cleared by the [place]  FDA and has been authorized for detection and/or diagnosis of SARS-CoV-2 by FDA under an Emergency Use Authorization (EUA). This EUA will remain in effect (meaning this test can be used) for the duration of the COVID-19 declaration under Section 564(b)(1) of the Act, 21 U.S.C. section 360bbb-3(b)(1), unless the authorization is terminated or revoked.     Resp Syncytial Virus by PCR NEGATIVE NEGATIVE Final    Comment: (NOTE) Fact Sheet for Patients: BloggerCourse.com  Fact Sheet for Healthcare Providers: SeriousBroker.it  This test is not yet approved or cleared by the [place]  FDA and has been authorized for detection and/or diagnosis of SARS-CoV-2 by FDA under an Emergency Use Authorization (EUA). This EUA will remain in effect (meaning this test can be used) for the duration of the COVID-19 declaration under Section 564(b)(1) of the  Act, 21 U.S.C. section 360bbb-3(b)(1), unless the authorization is terminated or revoked.  Performed at [facility], [address] [place], [place] [address]   Culture, blood (Routine x 2)     Status: None (Preliminary result)   Collection Time: 03/01/24  9:45 PM   Specimen: BLOOD RIGHT FOREARM  Result Value Ref Range Status   Specimen Description BLOOD RIGHT  FOREARM  Final   Special Requests   Final    BOTTLES DRAWN AEROBIC AND ANAEROBIC Blood Culture results may not be optimal due to an inadequate volume of blood received in culture bottles   Culture   Final    NO GROWTH 3 DAYS Performed at [facility], [address] [place], [place] [address]    Report Status PENDING  Incomplete  Culture, blood (Routine x 2)     Status: Abnormal   Collection Time: 03/01/24 10:15 PM   Specimen: BLOOD  Result Value Ref Range Status   Specimen Description BLOOD RIGHT ANTECUBITAL  Final   Special Requests   Final    BOTTLES DRAWN AEROBIC AND ANAEROBIC Blood Culture adequate volume   Culture  Setup Time   Final    GRAM NEGATIVE RODS IN BOTH AEROBIC AND ANAEROBIC BOTTLES CRITICAL RESULT CALLED TO, READ BACK BY AND VERIFIED WITH: PHARMD M BELL [id number] AT 1213 BY CM Performed at [facility], [address] [place], [place] [address]    Culture ESCHERICHIA COLI (A)  Final   Report Status 03/04/2024 FINAL  Final   Organism ID, Bacteria ESCHERICHIA COLI  Final   Organism ID, Bacteria ESCHERICHIA COLI  Final      Susceptibility   Escherichia coli - KIRBY BAUER*    CEFAZOLIN SENSITIVE Sensitive    Escherichia coli - MIC*    AMPICILLIN <=2 SENSITIVE Sensitive     CEFEPIME <=0.12 SENSITIVE Sensitive     CEFTAZIDIME <=1 SENSITIVE Sensitive     CEFTRIAXONE  <=0.25 SENSITIVE Sensitive     CIPROFLOXACIN <=0.25 SENSITIVE Sensitive     GENTAMICIN <=1 SENSITIVE Sensitive     IMIPENEM <=0.25 SENSITIVE Sensitive     TRIMETH/SULFA <=20 SENSITIVE Sensitive     AMPICILLIN/SULBACTAM <=2 SENSITIVE Sensitive     PIP/TAZO <=4 SENSITIVE Sensitive ug/mL    * ESCHERICHIA COLI    ESCHERICHIA COLI  Blood Culture ID Panel (Reflexed)     Status: Abnormal   Collection Time: 03/01/24 10:15 PM  Result Value Ref Range Status   Enterococcus faecalis NOT DETECTED NOT DETECTED Final   Enterococcus Faecium NOT DETECTED NOT DETECTED Final   Listeria monocytogenes NOT  DETECTED NOT DETECTED Final   Staphylococcus species NOT DETECTED NOT DETECTED Final   Staphylococcus aureus (BCID) NOT DETECTED NOT DETECTED Final   Staphylococcus epidermidis NOT DETECTED NOT DETECTED Final   Staphylococcus lugdunensis NOT DETECTED NOT DETECTED Final   Streptococcus species NOT DETECTED NOT DETECTED Final   Streptococcus agalactiae NOT DETECTED NOT DETECTED Final   Streptococcus pneumoniae NOT DETECTED NOT DETECTED Final   Streptococcus pyogenes NOT DETECTED NOT DETECTED Final   A.calcoaceticus-baumannii NOT DETECTED NOT DETECTED Final   Bacteroides fragilis NOT DETECTED NOT DETECTED Final   Enterobacterales DETECTED (A) NOT DETECTED Final    Comment: Enterobacterales represent a large order of gram negative bacteria, not a single organism. CRITICAL RESULT CALLED TO, READ BACK BY AND VERIFIED WITH: PHARMD M BELL [id number] AT 1213 BY CM    Enterobacter cloacae complex NOT DETECTED NOT DETECTED Final   Escherichia coli  DETECTED (A) NOT DETECTED Final    Comment: CRITICAL RESULT CALLED TO, READ BACK BY AND VERIFIED WITH: PHARMD M BELL [id number] AT 1213 BY CM    Klebsiella aerogenes NOT DETECTED NOT DETECTED Final   Klebsiella oxytoca NOT DETECTED NOT DETECTED Final   Klebsiella pneumoniae NOT DETECTED NOT DETECTED Final   Proteus species NOT DETECTED NOT DETECTED Final   Salmonella species NOT DETECTED NOT DETECTED Final   Serratia marcescens NOT DETECTED NOT DETECTED Final   Haemophilus influenzae NOT DETECTED NOT DETECTED Final   Neisseria meningitidis NOT DETECTED NOT DETECTED Final   Pseudomonas aeruginosa NOT DETECTED NOT DETECTED Final   Stenotrophomonas maltophilia NOT DETECTED NOT DETECTED Final   Candida albicans NOT DETECTED NOT DETECTED Final   Candida auris NOT DETECTED NOT DETECTED Final   Candida glabrata NOT DETECTED NOT DETECTED Final   Candida krusei NOT DETECTED NOT DETECTED Final   Candida parapsilosis NOT DETECTED NOT DETECTED Final   Candida  tropicalis NOT DETECTED NOT DETECTED Final   Cryptococcus neoformans/gattii NOT DETECTED NOT DETECTED Final   CTX-M ESBL NOT DETECTED NOT DETECTED Final   Carbapenem resistance IMP NOT DETECTED NOT DETECTED Final   Carbapenem resistance KPC NOT DETECTED NOT DETECTED Final   Carbapenem resistance NDM NOT DETECTED NOT DETECTED Final   Carbapenem resist OXA 48 LIKE NOT DETECTED NOT DETECTED Final   Carbapenem resistance VIM NOT DETECTED NOT DETECTED Final    Comment: Performed at [facility], [address] [place], [place] [address]  Urine Culture     Status: Abnormal   Collection Time: 03/02/24 12:33 AM   Specimen: Urine, Random  Result Value Ref Range Status   Specimen Description URINE, RANDOM  Final   Special Requests NONE Reflexed from [id number]  Final   Culture (A)  Final    <10,000 COLONIES/mL INSIGNIFICANT GROWTH Performed at [facility], [address] [place], [place] [address]    Report Status 03/02/2024 FINAL  Final     Time coordinating discharge: Over 30 minutes  SIGNED:   Haydee Lipa, DO [facility] 03/04/2024, 12:56 PM Pager   If 7PM-7AM, please contact night-coverage [email or web address]

## 2024-03-03 NOTE — TOC Progression Note (Signed)
 Transition of Care [facility]) - Progression Note    Patient Details  Name: DVID PENDRY MRN: [id number] Date of Birth: 06-May-1941  Transition of Care [facility]) CM/SW Contact  Maddock Finigan, Thersia Flax, RN Phone Number: 03/03/2024, 2:17 PM  Clinical Narrative: Spoke to spouse-d/c plan home used [facility] in past.Uses a scooter, & trapeze bar. Await PT recc. Has own transport home.     Expected Discharge Plan:  (TBD) Barriers to Discharge: Continued Medical Work up  Expected Discharge Plan and Services                                               Social Determinants of Health (SDOH) Interventions SDOH Screenings   Food Insecurity: No Food Insecurity (03/02/2024)  Housing: Low Risk  (03/02/2024)  Transportation Needs: No Transportation Needs (03/02/2024)  Utilities: Not At Risk (03/02/2024)  Alcohol Screen: Low Risk  (04/01/2023)  Depression (PHQ2-9): High Risk (12/05/2023)  Financial Resource Strain: Low Risk  (03/26/2022)  Physical Activity: Sufficiently Active (03/26/2022)  Social Connections: Moderately Isolated (03/02/2024)  Stress: Stress Concern Present (03/26/2022)  Tobacco Use: Medium Risk (03/01/2024)    Readmission Risk Interventions     No data to display

## 2024-03-04 ENCOUNTER — Other Ambulatory Visit (HOSPITAL_COMMUNITY): Payer: Self-pay

## 2024-03-04 DIAGNOSIS — R296 Repeated falls: Secondary | ICD-10-CM | POA: Diagnosis not present

## 2024-03-04 DIAGNOSIS — A419 Sepsis, unspecified organism: Secondary | ICD-10-CM | POA: Diagnosis not present

## 2024-03-04 DIAGNOSIS — N390 Urinary tract infection, site not specified: Secondary | ICD-10-CM | POA: Diagnosis not present

## 2024-03-04 DIAGNOSIS — G9341 Metabolic encephalopathy: Secondary | ICD-10-CM | POA: Diagnosis not present

## 2024-03-04 LAB — CBC
HCT: 36.4 % — ABNORMAL LOW (ref 39.0–52.0)
Hemoglobin: 11.7 g/dL — ABNORMAL LOW (ref 13.0–17.0)
MCH: 32.1 pg (ref 26.0–34.0)
MCHC: 32.1 g/dL (ref 30.0–36.0)
MCV: 100 fL (ref 80.0–100.0)
Platelets: 117 10*3/uL — ABNORMAL LOW (ref 150–400)
RBC: 3.64 MIL/uL — ABNORMAL LOW (ref 4.22–5.81)
RDW: 13.2 % (ref 11.5–15.5)
WBC: 7.3 10*3/uL (ref 4.0–10.5)
nRBC: 0 % (ref 0.0–0.2)

## 2024-03-04 LAB — BASIC METABOLIC PANEL WITH GFR
Anion gap: 9 (ref 5–15)
BUN: 15 mg/dL (ref 8–23)
CO2: 24 mmol/L (ref 22–32)
Calcium: 8.4 mg/dL — ABNORMAL LOW (ref 8.9–10.3)
Chloride: 103 mmol/L (ref 98–111)
Creatinine, Ser: 1.09 mg/dL (ref 0.61–1.24)
GFR, Estimated: >60 mL/min (ref >60)
Glucose, Bld: 88 mg/dL (ref 70–99)
Potassium: 3.4 mmol/L — ABNORMAL LOW (ref 3.5–5.1)
Sodium: 136 mmol/L (ref 135–145)

## 2024-03-04 LAB — CULTURE, BLOOD (ROUTINE X 2): Special Requests: ADEQUATE

## 2024-03-04 MED ORDER — CEFADROXIL 500 MG PO CAPS
1000.0000 mg | ORAL_CAPSULE | Freq: Two times a day (BID) | ORAL | 0 refills | Status: AC
  Filled 2024-03-04: qty 8, 2d supply, fill #0

## 2024-03-04 MED ORDER — CEFADROXIL 500 MG PO CAPS
1000.0000 mg | ORAL_CAPSULE | Freq: Two times a day (BID) | ORAL | Status: DC
  Administered 2024-03-04: 1000 mg via ORAL
  Filled 2024-03-04: qty 2

## 2024-03-04 NOTE — Progress Notes (Signed)
 Discharge medications delivered to patient at bedside [facility]

## 2024-03-04 NOTE — TOC Transition Note (Signed)
 Transition of Care [facility]) - Discharge Note   Patient Details  Name: Walter Brown MRN: [id number] Date of Birth: November 22, 1940  Transition of Care [facility]) CM/SW Contact:  Ruben Corolla, RN Phone Number: 03/04/2024, 10:24 AM   Clinical Narrative: Spoke to spouse declined PT recc [facility]-d/c plan home w/[facility]/OT/csw;already has private duty 24/7. Has own transport home. No further CM needs.      Final next level of care: Home w Home Health Services Barriers to Discharge: No Barriers Identified   Patient Goals and CMS Choice Patient states their goals for this hospitalization and ongoing recovery are:: Home CMS Medicare.gov Compare Post Acute Care list provided to:: Patient Represenative (must comment) Choice offered to / list presented to : Spouse [name] ownership interest in [facility].provided to:: Spouse    Discharge Placement                       Discharge Plan and Services Additional resources added to the After Visit Summary for     Discharge Planning Services: CM Consult Post Acute Care Choice: Home Health                    HH Arranged: PT, OT, Social Work [facility] Agency: [facility] Date [facility] Agency Contacted: 03/04/24 Time HH Agency Contacted: 1024 Representative spoke with at [facility] Agency: Randel Buss  Social Drivers of Health (SDOH) Interventions SDOH Screenings   Food Insecurity: No Food Insecurity (03/02/2024)  Housing: Low Risk  (03/02/2024)  Transportation Needs: No Transportation Needs (03/02/2024)  Utilities: Not At Risk (03/02/2024)  Alcohol Screen: Low Risk  (04/01/2023)  Depression (PHQ2-9): High Risk (12/05/2023)  Financial Resource Strain: Low Risk  (03/26/2022)  Physical Activity: Sufficiently Active (03/26/2022)  Social Connections: Moderately Isolated (03/02/2024)  Stress: Stress Concern Present (03/26/2022)  Tobacco Use: Medium Risk (03/01/2024)     Readmission Risk Interventions     No data to display

## 2024-03-04 NOTE — Progress Notes (Signed)
 Physical Therapy Treatment Patient Details Name: Walter Brown MRN: [id number] DOB: 02-25-1941 Today's Date: 03/04/2024   History of Present Illness Patient is a 83 year old male who presented on 5/26 with somnolence, fever and poor appetite. Patient was admitted with severe sepsis, UTI, acute metabolic encephalopathy, and chronic thrombocytopenia. PMHL recurrent falls, BPH, HTN, parkinson's disease, hypokalemia, COPD    PT Comments  Pt assisted with OOB to recliner this morning and requiring at least min physical assist as well as increased multimodal cues for technique and safety.  Per PT evaluation, pt transfers to scooter and does not ambulate due to falls.  Patient will benefit from continued inpatient follow up therapy, <3 hours/day.    If plan is discharge home, recommend the following: A lot of help with bathing/dressing/bathroom;Assistance with cooking/housework;Assist for transportation;Help with stairs or ramp for entrance;A lot of help with walking and/or transfers   Can travel by private vehicle        Equipment Recommendations  None recommended by PT    Recommendations for Other Services       Precautions / Restrictions Precautions Precautions: Fall Recall of Precautions/Restrictions: Impaired     Mobility  Bed Mobility Overal bed mobility: Needs Assistance Bed Mobility: Supine to Sit     Supine to sit: Min assist, HOB elevated, Used rails     General bed mobility comments: multimodal cues for technique; assist for trunk support, increased time required    Transfers Overall transfer level: Needs assistance Equipment used: Rolling walker (2 wheels) Transfers: Sit to/from Stand, Bed to chair/wheelchair/BSC Sit to Stand: Min assist   Step pivot transfers: Contact guard assist       General transfer comment: multimodal cues for technique, assist to rise and stabilize due to posterior lean; per PT evaluation - pt only transfer to scoot at baseline due to  fall risk    Ambulation/Gait                   Stairs             Wheelchair Mobility     Tilt Bed    Modified Rankin (Stroke Patients Only)       Balance Overall balance assessment: Needs assistance, History of Falls Sitting-balance support: Feet supported, Bilateral upper extremity supported Sitting balance-Leahy Scale: Poor   Postural control: Posterior lean Standing balance support: Bilateral upper extremity supported, During functional activity, Reliant on assistive device for balance Standing balance-Leahy Scale: Poor Standing balance comment: requires UE support and min assist to improve posterior bias                            Communication    Cognition Arousal: Alert Behavior During Therapy: Flat affect   PT - Cognitive impairments: Attention, Awareness, Safety/Judgement                         Following commands: Impaired Following commands impaired: Follows one step commands inconsistently, Follows one step commands with increased time    Cueing Cueing Techniques: Gestural cues, Verbal cues, Tactile cues  Exercises      General Comments        Pertinent Vitals/Pain Pain Assessment Pain Assessment: No/denies pain    Home Living                          Prior Function  PT Goals (current goals can now be found in the care plan section) Progress towards PT goals: Progressing toward goals    Frequency    Min 2X/week      PT Plan      Co-evaluation              AM-PAC PT "6 Clicks" Mobility   Outcome Measure  Help needed turning from your back to your side while in a flat bed without using bedrails?: A Lot Help needed moving from lying on your back to sitting on the side of a flat bed without using bedrails?: A Lot Help needed moving to and from a bed to a chair (including a wheelchair)?: A Lot Help needed standing up from a chair using your arms (e.g., wheelchair or  bedside chair)?: A Lot Help needed to walk in hospital room?: A Lot Help needed climbing 3-5 steps with a railing? : Total 6 Click Score: 11    End of Session Equipment Utilized During Treatment: Gait belt Activity Tolerance: Patient tolerated treatment well Patient left: in chair;with call bell/phone within reach;with chair alarm set   PT Visit Diagnosis: Other symptoms and signs involving the nervous system (R29.898);Other abnormalities of gait and mobility (R26.89)     Time: 1051-1100 PT Time Calculation (min) (ACUTE ONLY): 9 min  Charges:    $Therapeutic Activity: 8-22 mins PT General Charges $$ ACUTE PT VISIT: 1 Visit                    Henretta Lodge PT, DPT Physical Therapist Acute Rehabilitation Services Office: [phone]    Myna Asal Payson 03/04/2024, 12:25 PM

## 2024-03-04 NOTE — Progress Notes (Signed)
 Patient received discharge orders to go home with home health. Patient received discharge paperwork/instructions. RN went over discharge paperwork/instructions with the patient's spouse. Any questions/concerns were addressed/answered to the best of RN's ability. Patient left the hospital stable, had discharge paperwork/instructions and discharge medication, and had all personal belongings.

## 2024-03-05 ENCOUNTER — Telehealth: Payer: Self-pay

## 2024-03-05 NOTE — Transitions of Care (Post Inpatient/ED Visit) (Signed)
 03/05/2024  Name: Walter Brown MRN: [id number] DOB: [birth year]  Today's TOC FU Call Status: Today's TOC FU Call Status:: Successful TOC FU Call Completed TOC FU Call Complete Date: 03/05/24 Patient's Name and Date of Birth confirmed.  Transition Care Management Follow-up Telephone Call Date of Discharge: 03/04/24 Discharge Facility: [facility]) Type of Discharge: Inpatient Admission Primary Inpatient Discharge Diagnosis:: UTI How have you been since you were released from the hospital?: Better Any questions or concerns?: No  Items Reviewed: Did you receive and understand the discharge instructions provided?: Yes Medications obtained,verified, and reconciled?: Yes (Medications Reviewed) Any new allergies since your discharge?: No Dietary orders reviewed?: Yes Type of Diet Ordered:: Low Sodium Heart Healthy Do you have support at home?: Yes People in Home [RPT]: spouse Name of Support/Comfort Primary Source: Muhamad Serano, 24/7 paid caregivers  Medications Reviewed Today: Medications Reviewed Today     Reviewed by Claudene Crystal, RN (Case Manager) on 03/05/24 at 1004  Med List Status: <None>   Medication Order Taking? Sig Documenting Provider Last Dose Status Informant  acetaminophen  (TYLENOL ) 500 MG tablet [id number] No Take 2 tablets (1,000 mg total) by mouth every 6 (six) hours as needed for moderate pain (pain score 4-6). Debbra Fairy, PA-C Past Week Active Spouse/Significant Other, Care Giver  amiodarone  (PACERONE ) 200 MG tablet [id number] No TAKE 1/2 TABLET BY MOUTH DAILY Avanell Leigh, MD 03/01/2024 Morning Active Spouse/Significant Other, Care Giver  benzonatate  (TESSALON ) 100 MG capsule [id number] No TAKE 1-2 CAPSULES (100-200 MG TOTAL) BY MOUTH 2 (TWO) TIMES DAILY AS NEEDED FOR COUGH. Neda Balk, MD 03/01/2024 Morning Active Spouse/Significant Other, Care Giver  budesonide -formoterol  (SYMBICORT ) 160-4.5 MCG/ACT inhaler [id number] No Inhale 2 puffs into the  lungs 2 (two) times daily. Paz, Jose E, MD 02/29/2024 Bedtime Active Spouse/Significant Other, Care Giver  carbidopa -levodopa  (SINEMET  CR) 50-200 MG tablet [id number] No TAKE 1 TABLET BY MOUTH EVERYDAY AT BEDTIME Tat, Von Grumbling, DO 02/29/2024 Bedtime Active Spouse/Significant Other, Care Giver           Med Note (WHITE, Nieves Bars   Mon Mar 01, 2024 10:55 PM)    carbidopa -levodopa  (SINEMET  IR) 25-100 MG tablet [id number] No 2 AT 7AM, 2 AT 11AM, Take  1 AT 3PM, Take 1 at 7 PM Tat, Von Grumbling, DO 03/01/2024  7:00 PM Active Spouse/Significant Other, Care Giver  cefadroxil (DURICEF) 500 MG capsule [id number]  Take 2 capsules (1,000 mg total) by mouth 2 (two) times daily for 2 days. Haydee Lipa, MD  Active   cholecalciferol (VITAMIN D3) 25 MCG (1000 UNIT) tablet [id number] No Take 2,000 Units by mouth daily. [provider] 03/01/2024 Morning Active Spouse/Significant Other, Care Giver  divalproex  (DEPAKOTE ) 125 MG DR tablet [id number] No TAKE 1 TABLET (125 MG TOTAL) BY MOUTH AT BEDTIME Wertman, Sara E, PA-C 02/29/2024 Bedtime Active Spouse/Significant Other, Care Giver  ezetimibe  (ZETIA ) 10 MG tablet [id number] No TAKE 1 TABLET BY MOUTH EVERY DAY Walker, Caitlin S, NP 03/01/2024 Morning Active Spouse/Significant Other, Care Giver  guaifenesin  (ROBITUSSIN) 100 MG/5ML syrup [id number] No Take 10 mLs (200 mg total) by mouth 3 (three) times daily as needed for cough. Ezell Hollow, MD Past Week Active Spouse/Significant Other, Care Giver  hydrochlorothiazide  (HYDRODIURIL ) 25 MG tablet [id number] No TAKE 1 TABLET BY MOUTH EVERY DAY Neda Balk, MD 03/01/2024 Morning Active Spouse/Significant Other, Care Giver  levalbuterol  (XOPENEX  HFA) 45 MCG/ACT inhaler [id number] No INHALE 1 TO 2 PUFFS BY MOUTH EVERY 6 HOURS AS NEEDED FOR WHEEZE Rodrick Clapper,  Bonita Bussing, MD 02/29/2024 Noon Active Spouse/Significant Other, Care Giver  Melatonin 10 MG TABS [id number] No Take 10 mg by mouth daily as needed. [provider] Past  Week Active Spouse/Significant Other, Care Giver  mirabegron ER (MYRBETRIQ) 50 MG TB24 tablet [id number] No Take 50 mg by mouth daily. [provider] 03/01/2024 Morning Active Spouse/Significant Other, Care Giver  Multiple Vitamins-Minerals (ICAPS AREDS 2 PO) [id number] No Take 1 capsule by mouth in the morning and at bedtime. [provider] 03/01/2024 Morning Active Spouse/Significant Other, Care Giver  pantoprazole  (PROTONIX ) 40 MG tablet [id number] No Take 1 tablet (40 mg total) by mouth daily as needed. Dorrene Gaucher, NP Past Week Active Spouse/Significant Other, Care Giver  senna (SENOKOT) 8.6 MG TABS tablet [id number] No Take 1 tablet by mouth in the morning and at bedtime. [provider] 03/01/2024 Morning Active Spouse/Significant Other, Care Giver  sertraline  (ZOLOFT ) 100 MG tablet [id number] No TAKE 1 TABLET BY MOUTH EVERY DAY Neda Balk, MD 03/01/2024 Morning Active Spouse/Significant Other, Care Giver  solifenacin (VESICARE) 5 MG tablet [id number] No Take 5 mg by mouth daily. [provider] 03/01/2024 Morning Active Spouse/Significant Other, Care Giver            Home Care and Equipment/Supplies: Were Home Health Services Ordered?: Yes Name of Home Health Agency:: [facility] Has Agency set up a time to come to your home?: No EMR reviewed for Home Health Orders: Orders present/patient has not received call (refer to CM for follow-up) Any new equipment or medical supplies ordered?: No  Functional Questionnaire: Do you need assistance with bathing/showering or dressing?: Yes (Occasionally) Do you need assistance with meal preparation?: Yes (Dependnet on spouse and caregivers) Do you need assistance with eating?: No Do you have difficulty maintaining continence: Yes (Urine) Do you need assistance with getting out of bed/getting out of a chair/moving?: Yes (Occasionally. Has a pole to assist) Do you have difficulty managing or taking your  medications?: Yes (Dependent)  Follow up appointments reviewed: PCP Follow-up appointment confirmed?: Yes Date of PCP follow-up appointment?: 03/08/24 Follow-up Provider: Randie Bustle Specialist [facility] Follow-up appointment confirmed?: Yes Date of Specialist follow-up appointment?: 04/06/24 Follow-Up Specialty Provider:: Rob Chihuahua Do you need transportation to your follow-up appointment?: No Do you understand care options if your condition(s) worsen?: Yes-patient verbalized understanding  SDOH Interventions Today    Flowsheet Row Most Recent Value  SDOH Interventions   Food Insecurity Interventions Intervention Not Indicated  Housing Interventions Intervention Not Indicated  Transportation Interventions Intervention Not Indicated  Utilities Interventions Intervention Not Indicated       Gareld June, BSN, RN [place]  [facility] - [facility] RN Care Manager [phone]

## 2024-03-06 LAB — CULTURE, BLOOD (ROUTINE X 2): Culture: NO GROWTH

## 2024-03-07 DIAGNOSIS — G20A2 Parkinson's disease without dyskinesia, with fluctuations: Secondary | ICD-10-CM | POA: Diagnosis not present

## 2024-03-07 DIAGNOSIS — I10 Essential (primary) hypertension: Secondary | ICD-10-CM | POA: Diagnosis not present

## 2024-03-07 DIAGNOSIS — G3184 Mild cognitive impairment, so stated: Secondary | ICD-10-CM | POA: Diagnosis not present

## 2024-03-07 DIAGNOSIS — Z9181 History of falling: Secondary | ICD-10-CM | POA: Diagnosis not present

## 2024-03-07 DIAGNOSIS — Z7951 Long term (current) use of inhaled steroids: Secondary | ICD-10-CM | POA: Diagnosis not present

## 2024-03-07 DIAGNOSIS — D649 Anemia, unspecified: Secondary | ICD-10-CM | POA: Insufficient documentation

## 2024-03-07 DIAGNOSIS — I480 Paroxysmal atrial fibrillation: Secondary | ICD-10-CM | POA: Diagnosis not present

## 2024-03-07 DIAGNOSIS — G919 Hydrocephalus, unspecified: Secondary | ICD-10-CM | POA: Diagnosis not present

## 2024-03-07 NOTE — Assessment & Plan Note (Signed)
 Will monitor labs and supplement as needed

## 2024-03-07 NOTE — Assessment & Plan Note (Signed)
 In setting of sepsis and UTI, has returned home after recent hospitalization. Has been much better since returning home repeat labs today

## 2024-03-07 NOTE — Assessment & Plan Note (Signed)
 Lives at home with wife and is working closely with Neurology

## 2024-03-07 NOTE — Assessment & Plan Note (Signed)
 [id number] acceptable, minimize simple carbs. Increase exercise as tolerated.

## 2024-03-07 NOTE — Assessment & Plan Note (Signed)
 Tolerating statin, encouraged heart healthy diet, avoid trans fats, minimize simple carbs and saturated fats. Increase exercise as tolerated

## 2024-03-07 NOTE — Assessment & Plan Note (Signed)
 Supplement and monitor

## 2024-03-07 NOTE — Assessment & Plan Note (Signed)
 Well controlled, no changes to meds. Encouraged heart healthy diet such as the DASH diet and exercise as tolerated.

## 2024-03-08 ENCOUNTER — Ambulatory Visit (INDEPENDENT_AMBULATORY_CARE_PROVIDER_SITE_OTHER): Admitting: Family Medicine

## 2024-03-08 ENCOUNTER — Ambulatory Visit: Payer: Self-pay | Admitting: Family Medicine

## 2024-03-08 ENCOUNTER — Telehealth: Payer: Self-pay

## 2024-03-08 ENCOUNTER — Encounter: Payer: Self-pay | Admitting: Family Medicine

## 2024-03-08 VITALS — BP 123/80 | HR 64 | Temp 98.3°F | Resp 16 | Ht 67.0 in

## 2024-03-08 DIAGNOSIS — R059 Cough, unspecified: Secondary | ICD-10-CM

## 2024-03-08 DIAGNOSIS — Z9181 History of falling: Secondary | ICD-10-CM | POA: Diagnosis not present

## 2024-03-08 DIAGNOSIS — E782 Mixed hyperlipidemia: Secondary | ICD-10-CM

## 2024-03-08 DIAGNOSIS — G3184 Mild cognitive impairment, so stated: Secondary | ICD-10-CM | POA: Diagnosis not present

## 2024-03-08 DIAGNOSIS — I10 Essential (primary) hypertension: Secondary | ICD-10-CM

## 2024-03-08 DIAGNOSIS — E876 Hypokalemia: Secondary | ICD-10-CM

## 2024-03-08 DIAGNOSIS — I480 Paroxysmal atrial fibrillation: Secondary | ICD-10-CM | POA: Diagnosis not present

## 2024-03-08 DIAGNOSIS — G20A1 Parkinson's disease without dyskinesia, without mention of fluctuations: Secondary | ICD-10-CM | POA: Diagnosis not present

## 2024-03-08 DIAGNOSIS — D649 Anemia, unspecified: Secondary | ICD-10-CM

## 2024-03-08 DIAGNOSIS — G9341 Metabolic encephalopathy: Secondary | ICD-10-CM

## 2024-03-08 DIAGNOSIS — R739 Hyperglycemia, unspecified: Secondary | ICD-10-CM

## 2024-03-08 DIAGNOSIS — G919 Hydrocephalus, unspecified: Secondary | ICD-10-CM | POA: Diagnosis not present

## 2024-03-08 DIAGNOSIS — H6123 Impacted cerumen, bilateral: Secondary | ICD-10-CM | POA: Diagnosis not present

## 2024-03-08 DIAGNOSIS — E559 Vitamin D deficiency, unspecified: Secondary | ICD-10-CM

## 2024-03-08 DIAGNOSIS — G20A2 Parkinson's disease without dyskinesia, with fluctuations: Secondary | ICD-10-CM | POA: Diagnosis not present

## 2024-03-08 DIAGNOSIS — Z7951 Long term (current) use of inhaled steroids: Secondary | ICD-10-CM | POA: Diagnosis not present

## 2024-03-08 LAB — COMPREHENSIVE METABOLIC PANEL WITH GFR
ALT: 10 U/L (ref 0–53)
AST: 16 U/L (ref 0–37)
Albumin: 4.2 g/dL (ref 3.5–5.2)
Alkaline Phosphatase: 57 U/L (ref 39–117)
BUN: 12 mg/dL (ref 6–23)
CO2: 30 meq/L (ref 19–32)
Calcium: 9.7 mg/dL (ref 8.4–10.5)
Chloride: 102 meq/L (ref 96–112)
Creatinine, Ser: 0.91 mg/dL (ref 0.40–1.50)
GFR: 78.27 mL/min (ref >60.00)
Glucose, Bld: 96 mg/dL (ref 70–99)
Potassium: 3.6 meq/L (ref 3.5–5.1)
Sodium: 141 meq/L (ref 135–145)
Total Bilirubin: 0.6 mg/dL (ref 0.2–1.2)
Total Protein: 6.5 g/dL (ref 6.0–8.3)

## 2024-03-08 LAB — CBC WITH DIFFERENTIAL/PLATELET
Basophils Absolute: 0.1 10*3/uL (ref 0.0–0.1)
Basophils Relative: 0.7 % (ref 0.0–3.0)
Eosinophils Absolute: 0.2 10*3/uL (ref 0.0–0.7)
Eosinophils Relative: 2 % (ref 0.0–5.0)
HCT: 39.9 % (ref 39.0–52.0)
Hemoglobin: 13.5 g/dL (ref 13.0–17.0)
Lymphocytes Relative: 14.7 % (ref 12.0–46.0)
Lymphs Abs: 1.2 10*3/uL (ref 0.7–4.0)
MCHC: 33.8 g/dL (ref 30.0–36.0)
MCV: 94.1 fl (ref 78.0–100.0)
Monocytes Absolute: 0.8 10*3/uL (ref 0.1–1.0)
Monocytes Relative: 9.8 % (ref 3.0–12.0)
Neutro Abs: 5.9 10*3/uL (ref 1.4–7.7)
Neutrophils Relative %: 72.8 % (ref 43.0–77.0)
Platelets: 224 10*3/uL (ref 150.0–400.0)
RBC: 4.24 Mil/uL (ref 4.22–5.81)
RDW: 14 % (ref 11.5–15.5)
WBC: 8.1 10*3/uL (ref 4.0–10.5)

## 2024-03-08 LAB — VITAMIN D 25 HYDROXY (VIT D DEFICIENCY, FRACTURES): VITD: 22.31 ng/mL — ABNORMAL LOW (ref 30.00–100.00)

## 2024-03-08 MED ORDER — BENZONATATE 100 MG PO CAPS: 100.0000 mg | ORAL_CAPSULE | Freq: Two times a day (BID) | ORAL | 1 refills | Status: DC | PRN

## 2024-03-08 NOTE — Patient Instructions (Addendum)
 Orgain meal shakes and increase protein  Try Docusate with Senna 1 of each daily and then if no response go to 2 of each daily and let us  how you resond   Protein in Different Foods Protein is a nutrient that you get through your diet. Depending on your health, you may need more or less protein in your diet. You should eat a variety of protein foods to make sure that you get all the nutrients you need. Talk with your health care provider about how much protein you need each day. Protein helps your body: Fix and make cells and tissues. Fight infection. Have energy. Grow and develop. What are tips for getting more protein in your diet? Try to replace processed carbohydrates with high-quality protein. Snack on nuts and seeds instead of chips. Replace baked desserts with [place] yogurt. Eat protein foods from both plant and animal sources. Add beans and peas to salads, soups, and side dishes. Include a protein food with each meal and snack. Eat more whole grains. Add powdered milk or protein powder to hot cereals. Add peanut butter to toast or crackers instead of butter. Reading food labels You can find the amount of protein in a food item by looking at the nutrition facts label. Use the total grams listed to help you reach your daily goal. What foods are high in protein?  High-protein foods contain 4 grams (g) or more of protein per serving. They include: Grains Quinoa (cooked) -- 1 cup (185 g) has 8 g of protein. Whole wheat pasta (cooked) -- 1 cup (140 g) has 6 g of protein. Dairy Cottage cheese --  cup (114 g) has 13.4 g of protein. Milk -- 1 cup (237 mL) has 8 g of protein. Cheese (hard) -- 1 oz (28 g) has 7 g of protein. Yogurt, regular -- 6 oz (170 g) has 8 g of protein. Greek yogurt -- 6 oz (200 g) has 18 g protein. Meat Beef, ground sirloin (cooked) -- 3 oz (85 g) has 24 g of protein. Chicken breast, boneless and skinless (cooked) -- 3 oz (85 g) has 25 g of protein. Egg --  1 egg has 6 g of protein. Fish, filet (cooked) -- 3 oz (85g ) has 21-23 g of protein. Lamb (cooked) -- 3 oz (85 g) has 24 g of protein. Pork tenderloin (cooked) -- 3 oz (85 g) has 23 g of protein. Tuna (canned in water) -- 3 oz (85 g) has 20 g of protein. Plant protein Garbanzo beans (canned or cooked) --  cup (130 g) has 6-7 g of protein. Kidney beans (canned or cooked) --  cup (130 g) has 6-7 g of protein. Nuts (peanuts, pistachios, almonds) -- 1 oz (28 g) has 6 g of protein. Peanut butter -- 1 oz (32 g) has 7-8 g of protein. Pumpkin seeds -- 1 oz (28 g) has 8.5 g of protein. Soybeans (roasted) -- 1 oz (28 g) has 8 g of protein. Soybeans (cooked) --  cup (90 g) has 11 g of protein. Soy milk -- 1 cup (250 mL) has 5-10 g of protein. Soy or vegetable patty -- 1 patty has 11 g of protein. Sunflower seeds -- 1 oz (28 g) has 5.5 g of protein. Buckwheat -- 1 oz (33 g) has 4.3 g of protein. Tofu (firm) --  cup (124 g) has 20 g of protein. Tempeh --  cup (83 g) has 16 g of protein. The items listed above may not be a  complete list of foods that are high in protein. Actual amounts of protein may be different depending on processing. Talk with an expert in healthy eating called a dietitian to learn more. What foods are low in protein?  Low-protein foods contain 3 grams (g) or less of protein per serving. They include: Fruits Fruit or vegetable juice --  cup (125 mL) has 1 g of protein. Vegetables Beets (raw or cooked) --  cup (68 g) has 1.5 g of protein. Broccoli (raw or cooked) --  cup (44 g) has 2 g of protein. Collard greens (raw or cooked) --  cup (42 g) has 2 g of protein. Green beans (raw or cooked) --  cup (83 g) has 1 g of protein. Green peas (canned) --  cup (80 g) has 3.5 g of protein. Potato (baked with skin) -- 1 medium potato (173 g) has 3 g of protein. Spinach (cooked) --  cup (90 g) has 3 g of protein. Squash (cooked) --  cup (90 g) has 1.5 g of protein. Avocado  -- 1 cup (146 g) has 2.7 g of protein. Grains Bran cereal --  cup (30 g) has 3 g of protein. Whole wheat bread -- 1 slice has 3 g of protein. Corn (fresh or cooked) --  cup (77 g) has 2 g of protein. Flour tortilla -- One 6-inch (15 cm) tortilla has 2.5 g of protein. Muffins -- 1 small muffin (2 oz or 57 g) has 3 g of protein. Oatmeal (cooked) --  cup (40 g) has 3 g of protein. Brown rice (cooked) --  cup (78 g) has 2.5 g of protein. Dairy Cream cheese -- 1 oz (29 g) has 2 g of protein. Creamer (half-and-half) -- 1 oz (29 mL) has 1 g of protein. Frozen yogurt --  cup (72 g) has 3 g of protein. Sour cream --  cup (75 g) has 2.5 g of protein. The items listed above may not be a complete list of foods that are low in protein. Actual amounts of protein may be different depending on processing. Talk with an expert in healthy eating to learn more. This information is not intended to replace advice given to you by your health care provider. Make sure you discuss any questions you have with your health care provider. Document Revised: 02/17/2023 Document Reviewed: 02/17/2023 Elsevier Patient Education  2024 [employer].

## 2024-03-08 NOTE — Progress Notes (Signed)
 Subjective:    Patient ID: Walter Brown, male    DOB: [birth year], 83 y.o.   MRN: [id number]  Chief Complaint  Patient presents with   Hospitalization Follow-up    Patient presents today for a hospital follow-up. He was admitted into [facility] on 03/01/24-03/04/24 for urinary tract infection.    HPI Discussed the use of AI scribe software for clinical note transcription with the patient, who gave verbal consent to proceed.  History of Present Illness Walter Brown is an 83 year old male with Parkinson's disease who presents for follow-up regarding hypocalcemia and anemia.  He continues to improve since returning home, although he feels weaker than before his recent hospitalization. His memory remains intact, and he is consuming some food at home but acknowledges the need to increase his protein intake due to low protein levels noted during his hospital stay. He and his wife really appreciate the increased help at home and he has resumed PT at home since returning home from the hospital. His labs in hospital also showed hypocalcemia but denies CP/palp/SOB/HA/congestion/fevers or GU c/o. Taking meds as prescribed. He notes his appetite is improving and he eats a decent dinner but did loose weight this past month. Wife notes his hearing is diminished  He is experiencing ongoing bowel issues, similar to those before his hospitalization. He is currently using senna, adjusting the dose between one to three tablets depending on the frequency of bowel movements.  He was noted to be slightly anemic during his hospital stay, and his potassium and calcium  levels were slightly below normal.  He has a persistent cough, which may be related to his Parkinson's disease affecting his ability to protect his airways. The cough has been ongoing for a while. No fever, chills, or production of thick green sputum. He has been using Tessalon  Perles as needed for the cough, which helps by numbing the vocal  apparatus.    Past Medical History:  Diagnosis Date   A-fib [facility])    Allergy    Anemia    mild   Arthritis 04/06/2017   Asthma    childhood   ASVD (arteriosclerotic vascular disease) 09/07/2018   Back pain with radiation 04/26/2013   Low back with LLE radiculopathy   Basal cell carcinoma    skin- on nose- basal cell (20 yrs ago) forehead 1 year ago     Sees Dr Thais Fill of dermatology   BPH (benign prostatic hyperplasia)    Bradycardia 06/28/2018   Cataract 10/07/2014   Cerumen impaction 08/10/2012   Chicken pox as child   Coronary artery calcification 11/23/2019   Dysphagia 09/11/2021   Elevated LFTs    Essential hypertension    ETD (Eustachian tube dysfunction), right 01/17/2022   Generalized anxiety disorder    [place] measles as a child   Hemoptysis 01/17/2022   Hyperglycemia 04/25/2020   Hyperlipidemia    Insomnia 02/06/2021   Internal hemorrhoids    Irregular cardiac rhythm 03/10/2018   Lipoma 08/04/2018   Macular degeneration    Major depressive disorder 08/01/2012   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   Nocturia 12/04/2022   Obstructive sleep apnea 11/09/2018   Patient reports mild symptoms; was not prescribed a CPAP machine   Otitis externa 08/10/2012   PAF (paroxysmal atrial fibrillation)    Palpitations 09/07/2018   Parkinson's disease (HCC) 05/19/2019   Recurrent falls 12/04/2022   Thrombocytopenia    TIA (transient ischemic attack) 03/10/2018  Unsteady gait 09/11/2021    Past Surgical History:  Procedure Laterality Date   BELPHAROPTOSIS REPAIR     very young, b/l   EXCISIONAL HEMORRHOIDECTOMY     EYE SURGERY  2017   eyelids   HYDROCELE EXCISION / REPAIR  2012   b/l   SKIN CANCER EXCISION     nose and forehead, basal cell CA   UPPER GASTROINTESTINAL ENDOSCOPY      Family History  Problem Relation Age of Onset   Hypertension Mother    Other Mother        MRSA   Heart disease Mother        stents    ADD / ADHD Mother    Arthritis Mother    Asthma Mother    Depression Mother    Vision loss Mother    Heart disease Father    Hypertension Father    Prostate cancer Father 63   Parkinson's disease Father    Cancer Father 64   Hearing loss Father    Stroke Father    Vision loss Father    Varicose Veins Father    Stroke Maternal Grandmother    Prostate cancer Maternal Grandfather    Asthma Maternal Grandfather    Cancer Maternal Grandfather    Cancer Paternal Grandfather        EYE   Hearing loss Paternal Grandfather    ADD / ADHD Son        ADHD   Other Son 24       part of 1 lung removed- due to infection   Alcohol abuse Son    Depression Son    Alcohol abuse Son    Alcohol abuse Maternal Uncle    Depression Maternal Uncle    Asthma Maternal Uncle    Heart disease Paternal Uncle    Colon cancer Neg Hx    Esophageal cancer Neg Hx    Rectal cancer Neg Hx    Stomach cancer Neg Hx     Social History   Socioeconomic History   Marital status: Married    Spouse name: Not on file   Number of children: 2   Years of education: 16   Highest education level: Bachelor's degree (e.g., BA, AB, BS)  Occupational History   Occupation: Retired  Tobacco Use   Smoking status: Former    Current packs/day: 0.00    Average packs/day: 1.5 packs/day for 15.0 years (22.5 ttl pk-yrs)    Types: Cigarettes    Start date: 10/06/1978    Quit date: 10/07/1978    Years since quitting: 45.4   Smokeless tobacco: Never   Tobacco comments:    QUIT 1980  Vaping Use   Vaping status: Never Used  Substance and Sexual Activity   Alcohol use: Yes    Alcohol/week: 7.0 standard drinks of alcohol    Types: 7 Glasses of wine per week    Comment: wine with meals   Drug use: Not Currently   Sexual activity: Yes    Comment: lives with wife, 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  Other Topics Concern   Not on file  Social History Narrative    Right handed    Occasionally caffeine   Retired   Lives with wife   Social Drivers of Corporate investment banker Strain: Low Risk  (03/26/2022)   Overall Financial Resource Strain (CARDIA)    Difficulty of Paying Living Expenses: Not hard at all  Food Insecurity: No Food Insecurity (03/05/2024)   Hunger Vital Sign    Worried About Running Out of Food in the Last Year: Never true    Ran Out of Food in the Last Year: Never true  Transportation Needs: No Transportation Needs (03/05/2024)   PRAPARE - Administrator, Civil Service (Medical): No    Lack of Transportation (Non-Medical): No  Physical Activity: Sufficiently Active (03/26/2022)   Exercise Vital Sign    Days of Exercise per Week: 5 days    Minutes of Exercise per Session: 50 min  Stress: Stress Concern Present (03/26/2022)   [employer] of Occupational Health - Occupational Stress Questionnaire    Feeling of Stress : To some extent  Social Connections: Moderately Isolated (03/02/2024)   Social Connection and Isolation Panel [NHANES]    Frequency of Communication with Friends and Family: More than three times a week    Frequency of Social Gatherings with Friends and Family: Once a week    Attends Religious Services: Never    Database administrator or Organizations: No    Attends Banker Meetings: Never    Marital Status: Married  Catering manager Violence: Not At Risk (03/05/2024)   Humiliation, Afraid, Rape, and Kick questionnaire    Fear of Current or Ex-Partner: No    Emotionally Abused: No    Physically Abused: No    Sexually Abused: No    Outpatient Medications Prior to Visit  Medication Sig Dispense Refill   acetaminophen  (TYLENOL ) 500 MG tablet Take 2 tablets (1,000 mg total) by mouth every 6 (six) hours as needed for moderate pain (pain score 4-6). 90 tablet 0   amiodarone  (PACERONE ) 200 MG tablet TAKE 1/2 TABLET BY MOUTH DAILY 45 tablet 3   budesonide -formoterol  (SYMBICORT ) 160-4.5  MCG/ACT inhaler Inhale 2 puffs into the lungs 2 (two) times daily. 1 each 3   carbidopa -levodopa  (SINEMET  CR) 50-200 MG tablet TAKE 1 TABLET BY MOUTH EVERYDAY AT BEDTIME 90 tablet 0   carbidopa -levodopa  (SINEMET  IR) 25-100 MG tablet 2 AT 7AM, 2 AT 11AM, Take  1 AT 3PM, Take 1 at 7 PM 540 tablet 0   cholecalciferol (VITAMIN D3) 25 MCG (1000 UNIT) tablet Take 2,000 Units by mouth daily.     divalproex  (DEPAKOTE ) 125 MG DR tablet TAKE 1 TABLET (125 MG TOTAL) BY MOUTH AT BEDTIME 90 tablet 3   ezetimibe  (ZETIA ) 10 MG tablet TAKE 1 TABLET BY MOUTH EVERY DAY 90 tablet 3   guaifenesin  (ROBITUSSIN) 100 MG/5ML syrup Take 10 mLs (200 mg total) by mouth 3 (three) times daily as needed for cough.     hydrochlorothiazide  (HYDRODIURIL ) 25 MG tablet TAKE 1 TABLET BY MOUTH EVERY DAY 90 tablet 1   levalbuterol  (XOPENEX  HFA) 45 MCG/ACT inhaler INHALE 1 TO 2 PUFFS BY MOUTH EVERY 6 HOURS AS NEEDED FOR WHEEZE 15 each 1   Melatonin 10 MG TABS Take 10 mg by mouth daily as needed.     mirabegron ER (MYRBETRIQ) 50 MG TB24 tablet Take 50 mg by mouth daily.     Multiple Vitamins-Minerals (ICAPS AREDS 2 PO) Take 1 capsule by mouth in the morning and at bedtime.     pantoprazole  (PROTONIX ) 40 MG tablet Take 1 tablet (40 mg total) by mouth daily as needed.     senna (SENOKOT) 8.6 MG TABS tablet Take 1 tablet by mouth in the morning and at bedtime.     sertraline  (ZOLOFT ) 100 MG tablet TAKE 1 TABLET BY MOUTH  EVERY DAY 90 tablet 1   solifenacin (VESICARE) 5 MG tablet Take 5 mg by mouth daily.     benzonatate  (TESSALON ) 100 MG capsule TAKE 1-2 CAPSULES (100-200 MG TOTAL) BY MOUTH 2 (TWO) TIMES DAILY AS NEEDED FOR COUGH. 40 capsule 1   No facility-administered medications prior to visit.    Allergies  Allergen Reactions   Albuterol  Sulfate Palpitations   Azithromycin Other (See Comments)    Hepatotoxicity   Doxycycline Hyclate Other (See Comments)    Taken with Azithromycin and had Heaptotoxicity    Review of Systems   Constitutional:  Positive for malaise/fatigue. Negative for fever.  HENT:  Positive for hearing loss. Negative for congestion.   Eyes:  Negative for blurred vision.  Respiratory:  Negative for shortness of breath.   Cardiovascular:  Negative for chest pain, palpitations and leg swelling.  Gastrointestinal:  Negative for abdominal pain, blood in stool and nausea.  Genitourinary:  Negative for dysuria and frequency.  Musculoskeletal:  Negative for falls.  Skin:  Negative for rash.  Neurological:  Positive for weakness. Negative for dizziness, loss of consciousness and headaches.  Endo/Heme/Allergies:  Negative for environmental allergies.  Psychiatric/Behavioral:  Positive for memory loss. Negative for depression. The patient is not nervous/anxious.        Objective:     Physical Exam Vitals reviewed.  Constitutional:      Appearance: Normal appearance. He is not ill-appearing.  HENT:     Head: Normocephalic and atraumatic.     Right Ear: There is impacted cerumen.     Left Ear: There is impacted cerumen.     Nose: Nose normal.  Eyes:     Conjunctiva/sclera: Conjunctivae normal.  Cardiovascular:     Rate and Rhythm: Normal rate.     Pulses: Normal pulses.     Heart sounds: Normal heart sounds. No murmur heard. Pulmonary:     Effort: Pulmonary effort is normal.     Breath sounds: Normal breath sounds. No wheezing.  Abdominal:     Palpations: Abdomen is soft. There is no mass.     Tenderness: There is no abdominal tenderness.  Musculoskeletal:     Cervical back: Normal range of motion.     Right lower leg: No edema.     Left lower leg: No edema.  Skin:    General: Skin is warm and dry.  Neurological:     General: No focal deficit present.     Mental Status: He is alert and oriented to person, place, and time.  Psychiatric:        Mood and Affect: Mood normal.     BP 123/80   Pulse 64   Temp 98.3 F (36.8 C)   Resp 16   Ht 5\' 7"  (1.702 m)   SpO2 98%   BMI  28.19 kg/m  Wt Readings from Last 3 Encounters:  03/05/24 180 lb (81.6 kg)  03/01/24 180 lb (81.6 kg)  01/16/24 179 lb (81.2 kg)    Diabetic Foot Exam - Simple   No data filed    Lab Results  Component Value Date   WBC 7.3 03/04/2024   HGB 11.7 (L) 03/04/2024   HCT 36.4 (L) 03/04/2024   PLT 117 (L) 03/04/2024   GLUCOSE 88 03/04/2024   CHOL 166 02/04/2024   TRIG 92.0 02/04/2024   HDL 69.30 02/04/2024   LDLCALC 79 02/04/2024   ALT 7 03/01/2024   AST 22 03/01/2024   NA 136 03/04/2024   K 3.4 (L)  03/04/2024   CL 103 03/04/2024   CREATININE 1.09 03/04/2024   BUN 15 03/04/2024   CO2 24 03/04/2024   TSH 1.74 02/04/2024   INR 1.1 03/01/2024   HGBA1C 5.2 02/04/2024    Lab Results  Component Value Date   TSH 1.74 02/04/2024   Lab Results  Component Value Date   WBC 7.3 03/04/2024   HGB 11.7 (L) 03/04/2024   HCT 36.4 (L) 03/04/2024   MCV 100.0 03/04/2024   PLT 117 (L) 03/04/2024   Lab Results  Component Value Date   NA 136 03/04/2024   K 3.4 (L) 03/04/2024   CO2 24 03/04/2024   GLUCOSE 88 03/04/2024   BUN 15 03/04/2024   CREATININE 1.09 03/04/2024   BILITOT 1.0 03/01/2024   ALKPHOS 44 03/01/2024   AST 22 03/01/2024   ALT 7 03/01/2024   PROT 6.0 (L) 03/01/2024   ALBUMIN 3.4 (L) 03/01/2024   CALCIUM  8.4 (L) 03/04/2024   ANIONGAP 9 03/04/2024   EGFR 74 07/09/2023   GFR 77.30 02/04/2024   Lab Results  Component Value Date   CHOL 166 02/04/2024   Lab Results  Component Value Date   HDL 69.30 02/04/2024   Lab Results  Component Value Date   LDLCALC 79 02/04/2024   Lab Results  Component Value Date   TRIG 92.0 02/04/2024   Lab Results  Component Value Date   CHOLHDL 2 02/04/2024   Lab Results  Component Value Date   HGBA1C 5.2 02/04/2024       Assessment & Plan:  Acute metabolic encephalopathy Assessment & Plan: In setting of sepsis and UTI, has returned home after recent hospitalization. Has been much better since returning home repeat  labs today   Essential hypertension Assessment & Plan: Well controlled, no changes to meds. Encouraged heart healthy diet such as the DASH diet and exercise as tolerated.     Hyperglycemia Assessment & Plan: hgba1c acceptable, minimize simple carbs. Increase exercise as tolerated.    Mixed hyperlipidemia Assessment & Plan: Tolerating statin, encouraged heart healthy diet, avoid trans fats, minimize simple carbs and saturated fats. Increase exercise as tolerated   Parkinson's disease, unspecified whether dyskinesia present, unspecified whether manifestations fluctuate (HCC) Assessment & Plan: Lives at home with wife and is working closely with Neurology   Anemia, unspecified type Assessment & Plan: Will monitor labs and supplement as needed  Orders: -     CBC with Differential/Platelet -     Iron, TIBC and Ferritin Panel  Hypokalemia Assessment & Plan: Supplement and monitor    Hypocalcemia -     Comprehensive metabolic panel with GFR -     VITAMIN D 25 Hydroxy (Vit-D Deficiency, Fractures)  Bilateral hearing loss due to cerumen impaction Assessment & Plan: His wife has noticed that even with his hearing aides he is not hearing her as well. On exam he has bilateral cerumen impaction. They are are advised to apply 3-5 drops of peroxide to b/l ears daily x 7 days then return for disimpaction   Cough, unspecified type Assessment & Plan: Noted about a month again. Not productive and no fevers. Maybe improving some. Given refill on Tessalon  perles to use prn and report if worsens   Other orders -     Benzonatate ; Take 1-2 capsules (100-200 mg total) by mouth 2 (two) times daily as needed for cough.  Dispense: 40 capsule; Refill: 1    Assessment and Plan Assessment & Plan       Randie Bustle, MD

## 2024-03-08 NOTE — Telephone Encounter (Signed)
 Returned Baron Border w/ [facility] call and no answer left vm to return call.   Copied from CRM [id number]. Topic: Clinical - Home Health Verbal Orders >> Mar 08, 2024  2:58 PM Alyse July wrote: Caller/Agency: [facility] Callback Number: [phone] Service Requested: Occupational Therapy Frequency: Once a week for 5 weeks Any new concerns about the patient? No

## 2024-03-08 NOTE — Assessment & Plan Note (Signed)
 His wife has noticed that even with his hearing aides he is not hearing her as well. On exam he has bilateral cerumen impaction. They are are advised to apply 3-5 drops of peroxide to b/l ears daily x 7 days then return for disimpaction

## 2024-03-08 NOTE — Assessment & Plan Note (Signed)
 Noted about a month again. Not productive and no fevers. Maybe improving some. Given refill on Tessalon  perles to use prn and report if worsens

## 2024-03-09 DIAGNOSIS — G912 (Idiopathic) normal pressure hydrocephalus: Secondary | ICD-10-CM | POA: Diagnosis not present

## 2024-03-09 LAB — IRON,TIBC AND FERRITIN PANEL
%SAT: 21 % (ref 20–48)
Ferritin: 97 ng/mL (ref 24–380)
Iron: 68 ug/dL (ref 50–180)
TIBC: 318 ug/dL (ref 250–425)

## 2024-03-09 MED ORDER — VITAMIN D (ERGOCALCIFEROL) 1.25 MG (50000 UNIT) PO CAPS: 50000.0000 [IU] | ORAL_CAPSULE | ORAL | 4 refills | Status: AC

## 2024-03-09 NOTE — Telephone Encounter (Signed)
 Copied from CRM [id number]. Topic: Clinical - Lab/Test Results >> Mar 09, 2024 11:28 AM [place] J wrote: Reason for CRM: [name] is calling in to get lab results relayed. Results were successfully relayed and they have questions at this time.

## 2024-03-09 NOTE — Telephone Encounter (Signed)
 Copied from CRM [id number]. Topic: Clinical - Home Health Verbal Orders >> Mar 09, 2024  3:29 PM Chuck Crater wrote: Walter Brown is returning a call from [facility] in regards to verbal orders. [name] said that her voicemail is protected if you want to say yes or no to the verbal order.   Returned [name]'s call and she was advised ok to move forward with the verbal order.

## 2024-03-09 NOTE — Telephone Encounter (Deleted)
 Copied from CRM [id number]. Topic: Clinical - Home Health Verbal Orders >> Mar 09, 2024  3:29 PM Chuck Crater wrote: Marilyne Shu is returning a call from [facility] in regards to verbal orders. [name] said that her voicemail is protected if you want to say yes or no to the verbal order.

## 2024-03-10 DIAGNOSIS — G3184 Mild cognitive impairment, so stated: Secondary | ICD-10-CM | POA: Diagnosis not present

## 2024-03-10 DIAGNOSIS — G20A2 Parkinson's disease without dyskinesia, with fluctuations: Secondary | ICD-10-CM | POA: Diagnosis not present

## 2024-03-10 DIAGNOSIS — G919 Hydrocephalus, unspecified: Secondary | ICD-10-CM | POA: Diagnosis not present

## 2024-03-10 DIAGNOSIS — Z9181 History of falling: Secondary | ICD-10-CM | POA: Diagnosis not present

## 2024-03-10 DIAGNOSIS — I480 Paroxysmal atrial fibrillation: Secondary | ICD-10-CM | POA: Diagnosis not present

## 2024-03-10 DIAGNOSIS — Z7951 Long term (current) use of inhaled steroids: Secondary | ICD-10-CM | POA: Diagnosis not present

## 2024-03-10 DIAGNOSIS — I10 Essential (primary) hypertension: Secondary | ICD-10-CM | POA: Diagnosis not present

## 2024-03-11 DIAGNOSIS — G20A2 Parkinson's disease without dyskinesia, with fluctuations: Secondary | ICD-10-CM | POA: Diagnosis not present

## 2024-03-11 DIAGNOSIS — I10 Essential (primary) hypertension: Secondary | ICD-10-CM | POA: Diagnosis not present

## 2024-03-11 DIAGNOSIS — Z7951 Long term (current) use of inhaled steroids: Secondary | ICD-10-CM | POA: Diagnosis not present

## 2024-03-11 DIAGNOSIS — G919 Hydrocephalus, unspecified: Secondary | ICD-10-CM | POA: Diagnosis not present

## 2024-03-11 DIAGNOSIS — Z9181 History of falling: Secondary | ICD-10-CM | POA: Diagnosis not present

## 2024-03-11 DIAGNOSIS — I480 Paroxysmal atrial fibrillation: Secondary | ICD-10-CM | POA: Diagnosis not present

## 2024-03-11 DIAGNOSIS — G3184 Mild cognitive impairment, so stated: Secondary | ICD-10-CM | POA: Diagnosis not present

## 2024-03-12 ENCOUNTER — Other Ambulatory Visit: Payer: Self-pay

## 2024-03-12 NOTE — Patient Instructions (Signed)
 Visit Information  Thank you for taking time to visit with me today. Please don't hesitate to contact me if I can be of assistance to you before our next scheduled appointment.  Our next appointment is by telephone on 03/26/24 at 2:00 Please call the care guide team at [phone] if you need to cancel or reschedule your appointment.   Following is a copy of your care plan:   Goals Addressed             This Visit's Progress    VBCI RN Care Plan   Worsening    Problems:  Chronic Disease Management support and education needs related to Parkinsons Disease  Goal: Over the next 30 days the Patient will attend all scheduled medical appointments: with PCP and Neurology as evidenced by chart review and patient report        continue to work with RN Care Manager and/or Social Worker to address care management and care coordination needs related to Parkinsons Disease as evidenced by adherence to care management team scheduled appointments     not experience hospital admission as evidenced by review of electronic medical record. Hospital Admissions in last 6 months = 1 take all medications exactly as prescribed and will call provider for medication related questions as evidenced by chart review and patient report    verbalize basic understanding of Parkinsons Disease disease process and self health management plan as evidenced by patient asking questions and engaging in discussion with providers Patient will contact Neurology provider to schedule shunt placement for Normal Pressure Hydrocephalus  Interventions:   Evaluation of current treatment plan related to Parkinsons Disease, Transportation, Level of care concerns, and Inability to perform ADL's independently self-management and patient's adherence to plan as established by provider. Discussed plans with patient for ongoing care management follow up and provided patient with direct contact information for care management team Reviewed  medications with patient and discussed future caregiving needs  Confirmed patient has HH PT/OT coming 1x weekly Discussed plan to have stent placement to treat NPH  Patient Self-Care Activities:  Attend all scheduled provider appointments Call pharmacy for medication refills 3-7 days in advance of running out of medications Call provider office for new concerns or questions  Take medications as prescribed    Plan:  Telephone follow up appointment with care management team member scheduled for:  03/26/24 at 2:00             Please call the Suicide and Crisis Lifeline: 988 call the [place]  National Suicide Prevention Lifeline: 4086414390 or TTY: 437-269-3160 TTY 413-034-3277) to talk to a trained counselor call 1-800-273-TALK (toll free, 24 hour hotline) if you are experiencing a Mental Health or Behavioral Health Crisis or need someone to talk to.  Patient verbalizes understanding of instructions and care plan provided today and agrees to view in MyChart. Active MyChart status and patient understanding of how to access instructions and care plan via MyChart confirmed with patient.      Clarnce Crow BSN RN CCM [employer]  [facility], [facility] RN Care Manager Direct Dial: [phone] Fax: [phone]

## 2024-03-12 NOTE — Patient Outreach (Signed)
 Complex Care Management   Visit Note  03/12/2024  Name:  Walter Brown MRN: [id number] DOB: [birth year]  Situation: Referral received for Complex Care Management related to Parkinsons Disease and Normal Pressure Hydrocephalus I obtained verbal consent from Patient.  Visit completed with patient and spouse  on the phone  Background:   Past Medical History:  Diagnosis Date   A-fib [facility])    Allergy    Anemia    mild   Arthritis 04/06/2017   Asthma    childhood   ASVD (arteriosclerotic vascular disease) 09/07/2018   Back pain with radiation 04/26/2013   Low back with LLE radiculopathy   Basal cell carcinoma    skin- on nose- basal cell (20 yrs ago) forehead 1 year ago     Sees Dr Thais Fill of dermatology   BPH (benign prostatic hyperplasia)    Bradycardia 06/28/2018   Cataract 10/07/2014   Cerumen impaction 08/10/2012   Chicken pox as child   Coronary artery calcification 11/23/2019   Dysphagia 09/11/2021   Elevated LFTs    Essential hypertension    ETD (Eustachian tube dysfunction), right 01/17/2022   Generalized anxiety disorder    [place] measles as a child   Hemoptysis 01/17/2022   Hyperglycemia 04/25/2020   Hyperlipidemia    Insomnia 02/06/2021   Internal hemorrhoids    Irregular cardiac rhythm 03/10/2018   Lipoma 08/04/2018   Macular degeneration    Major depressive disorder 08/01/2012   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   Nocturia 12/04/2022   Obstructive sleep apnea 11/09/2018   Patient reports mild symptoms; was not prescribed a CPAP machine   Otitis externa 08/10/2012   PAF (paroxysmal atrial fibrillation)    Palpitations 09/07/2018   Parkinson's disease (HCC) 05/19/2019   Recurrent falls 12/04/2022   Thrombocytopenia    TIA (transient ischemic attack) 03/10/2018   Unsteady gait 09/11/2021    Assessment: Patient Reported Symptoms:  Cognitive Cognitive Status: Alert and oriented to person, place, and time       Neurological Neurological Review of Symptoms: Weakness, Hearing changes (PD symptoms of imbalance, has HH PT/OT 1 weekly) Neurological Conditions: Parkinson's disease Neurological Management Strategies: Routine screening, Medication therapy (Normal Pressure Hydrocephalus  will be scheduled to have stent placement with Dr Larrie Po) Neurological Self-Management Outcome: 3 (uncertain)  HEENT HEENT Symptoms Reported: Change or loss of hearing (using peroxide drops bilateral ears, to return to clinic 6/10 for cerumen removal) HEENT Conditions: Ear problem(s) HEENT Management Strategies: Routine screening Ear problem(s)  Cardiovascular Cardiovascular Symptoms Reported: No symptoms reported Cardiovascular Conditions: Dysrhythmia, Hypertension Cardiovascular Management Strategies: Routine screening, Medication therapy Cardiovascular Self-Management Outcome: 3 (uncertain)  Respiratory Respiratory Symptoms Reported: Dry cough Additional Respiratory Details: using cough syrup and Tessalon  Respiratory Conditions: Cough  Endocrine Patient reports the following symptoms related to hypoglycemia or hyperglycemia : No symptoms reported    Gastrointestinal Gastrointestinal Symptoms Reported: Constipation Additional Gastrointestinal Details: takes Senna Gastrointestinal Conditions: Constipation Gastrointestinal Management Strategies: Medication therapy, Fluid modification, Diet modification Gastrointestinal Comment: encouraged diet changes Nutrition Risk Screen (CP): No indicators present  Genitourinary Additional Genitourinary Details: completed abx, no incontinence    Integumentary Integumentary Symptoms Reported: No symptoms reported Skin Conditions: Wound Skin Comment: scraped back on commode,  have since padded that spot  Musculoskeletal Musculoskelatal Symptoms Reviewed: Difficulty walking, Unsteady gait, Weakness Additional Musculoskeletal Details: patient currently receiving HH PT/OT 1x  weekly Musculoskeletal Conditions: Mobility limited, Unsteady gait Musculoskeletal Management Strategies: Medical device, Medication therapy, Exercise, Routine screening Falls in the  past year?: No (no falls since discharge from hospital, using cane) Patient at Risk for Falls Due to: History of fall(s), Impaired balance/gait, Impaired mobility  Psychosocial Psychosocial Symptoms Reported: No symptoms reported     Quality of Family Relationships: helpful, involved, supportive Do you feel physically threatened by others?: No      12/05/2023    1:47 PM  Depression screen PHQ 2/9  Decreased Interest 3  Down, Depressed, Hopeless 1  PHQ - 2 Score 4  Altered sleeping 0  Tired, decreased energy 3  Change in appetite 0  Feeling bad or failure about yourself  2  Trouble concentrating 2  Moving slowly or fidgety/restless 3  Suicidal thoughts 0  PHQ-9 Score 14  Difficult doing work/chores Somewhat difficult    There were no vitals filed for this visit.  Medications Reviewed Today   Medications were not reviewed in this encounter     Recommendation:   Specialty provider follow-up patient to call Neuro to schedule shunt placement  Follow Up Plan:   Telephone follow up appointment date/time:  03/26/24   Clarnce Crow BSN RN CCM [place]  [facility], [facility] RN Care Manager Direct Dial: [phone] Fax: [phone]

## 2024-03-15 DIAGNOSIS — G3184 Mild cognitive impairment, so stated: Secondary | ICD-10-CM | POA: Diagnosis not present

## 2024-03-15 DIAGNOSIS — G919 Hydrocephalus, unspecified: Secondary | ICD-10-CM | POA: Diagnosis not present

## 2024-03-15 DIAGNOSIS — G20A2 Parkinson's disease without dyskinesia, with fluctuations: Secondary | ICD-10-CM | POA: Diagnosis not present

## 2024-03-15 DIAGNOSIS — Z7951 Long term (current) use of inhaled steroids: Secondary | ICD-10-CM | POA: Diagnosis not present

## 2024-03-15 DIAGNOSIS — I10 Essential (primary) hypertension: Secondary | ICD-10-CM | POA: Diagnosis not present

## 2024-03-15 DIAGNOSIS — I480 Paroxysmal atrial fibrillation: Secondary | ICD-10-CM | POA: Diagnosis not present

## 2024-03-15 DIAGNOSIS — Z9181 History of falling: Secondary | ICD-10-CM | POA: Diagnosis not present

## 2024-03-16 ENCOUNTER — Other Ambulatory Visit: Payer: Self-pay | Admitting: Neurosurgery

## 2024-03-16 ENCOUNTER — Ambulatory Visit

## 2024-03-16 ENCOUNTER — Telehealth: Payer: Self-pay

## 2024-03-16 ENCOUNTER — Encounter: Payer: Self-pay | Admitting: Student

## 2024-03-16 ENCOUNTER — Ambulatory Visit (INDEPENDENT_AMBULATORY_CARE_PROVIDER_SITE_OTHER): Admitting: Student

## 2024-03-16 VITALS — BP 119/65 | HR 52 | Temp 97.5°F | Resp 12

## 2024-03-16 DIAGNOSIS — G919 Hydrocephalus, unspecified: Secondary | ICD-10-CM | POA: Diagnosis not present

## 2024-03-16 DIAGNOSIS — H6123 Impacted cerumen, bilateral: Secondary | ICD-10-CM

## 2024-03-16 DIAGNOSIS — Z9181 History of falling: Secondary | ICD-10-CM | POA: Diagnosis not present

## 2024-03-16 DIAGNOSIS — G20A2 Parkinson's disease without dyskinesia, with fluctuations: Secondary | ICD-10-CM | POA: Diagnosis not present

## 2024-03-16 DIAGNOSIS — I10 Essential (primary) hypertension: Secondary | ICD-10-CM | POA: Diagnosis not present

## 2024-03-16 DIAGNOSIS — I480 Paroxysmal atrial fibrillation: Secondary | ICD-10-CM | POA: Diagnosis not present

## 2024-03-16 DIAGNOSIS — G3184 Mild cognitive impairment, so stated: Secondary | ICD-10-CM | POA: Diagnosis not present

## 2024-03-16 DIAGNOSIS — Z7951 Long term (current) use of inhaled steroids: Secondary | ICD-10-CM | POA: Diagnosis not present

## 2024-03-16 HISTORY — DX: Impacted cerumen, bilateral: H61.23

## 2024-03-16 NOTE — Telephone Encounter (Signed)
 Copied from CRM [id number]. Topic: Clinical - Home Health Verbal Orders >> Mar 16, 2024 12:13 PM Allyne Areola wrote: Caller/Agency: [facility] Callback Number: [phone] Service Requested: Occupational Therapy Frequency: 1 time a week for 5 weeks Any new concerns about the patient? No  Ammon Bales w/ [facility] was given a verbal order for OT for  Once a week for 5 weeks. She reports that they needed a new order for a new cycle.

## 2024-03-16 NOTE — Progress Notes (Signed)
 Subjective:     Patient ID: Walter Brown, male    DOB: 20-Oct-1940, 83 y.o.   MRN: [id number]  Chief Complaint  Patient presents with   billateral cerumen impaction    HPI  History of Present Illness        DEMONT Brown iis a 83 y.o. male who presents for bilateral cerumen impaction.  Presents with wife and caregiver.   On physical examination, cerumen impairs clinically significant portions of the external auditory canal, and tympanic membrane. Noted obstructive, copious cerumen that cannot be removed without magnification and instrumentations requiring professional removal.   Consent: Discussed benefits and risks of procedure and verbal consent obtained  Procedure: Patient was prepped for the procedure. An otoscope was used to examine the ear canal and tympanic membrane, noting the presence, amount, and location of cerumen. Gentle irrigation was performed using a mixture of liquid hydrogen peroxide and water warmed to body temperature. A soft plastic curette was then used to carefully remove impacted cerumen. Excess water drained by gravity and ear canal(s) dried with clean guaze.  Otoscopic examination reveals complete cerumen removal with no damage to the auditory canal, tympanic membrane, or surrounding tissue.   Patient tolerated procedure well.   Patient denies fever, chills, SOB, CP, palpitations, dyspnea, edema, HA, vision changes, N/V/D, abdominal pain, urinary symptoms, rash, weight changes, and recent illness or hospitalizations.   Health Maintenance Due  Topic Date Due   Medicare Annual Wellness (AWV)  03/31/2024    Past Medical History:  Diagnosis Date   A-fib [facility])    Allergy    Anemia    mild   Arthritis 04/06/2017   Asthma    childhood   ASVD (arteriosclerotic vascular disease) 09/07/2018   Back pain with radiation 04/26/2013   Low back with LLE radiculopathy   Basal cell carcinoma    skin- on nose- basal cell (20 yrs ago) forehead 1 year ago      Sees Dr Thais Fill of dermatology   BPH (benign prostatic hyperplasia)    Bradycardia 06/28/2018   Cataract 10/07/2014   Cerumen impaction 08/10/2012   Chicken pox as child   Coronary artery calcification 11/23/2019   Dysphagia 09/11/2021   Elevated LFTs    Essential hypertension    ETD (Eustachian tube dysfunction), right 01/17/2022   Generalized anxiety disorder    [place] measles as a child   Hemoptysis 01/17/2022   Hyperglycemia 04/25/2020   Hyperlipidemia    Insomnia 02/06/2021   Internal hemorrhoids    Irregular cardiac rhythm 03/10/2018   Lipoma 08/04/2018   Macular degeneration    Major depressive disorder 08/01/2012   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   Nocturia 12/04/2022   Obstructive sleep apnea 11/09/2018   Patient reports mild symptoms; was not prescribed a CPAP machine   Otitis externa 08/10/2012   PAF (paroxysmal atrial fibrillation)    Palpitations 09/07/2018   Parkinson's disease (HCC) 05/19/2019   Recurrent falls 12/04/2022   Thrombocytopenia    TIA (transient ischemic attack) 03/10/2018   Unsteady gait 09/11/2021    Past Surgical History:  Procedure Laterality Date   BELPHAROPTOSIS REPAIR     very young, b/l   EXCISIONAL HEMORRHOIDECTOMY     EYE SURGERY  2017   eyelids   HYDROCELE EXCISION / REPAIR  2012   b/l   SKIN CANCER EXCISION     nose and forehead, basal cell CA   UPPER GASTROINTESTINAL ENDOSCOPY  Family History  Problem Relation Age of Onset   Hypertension Mother    Other Mother        MRSA   Heart disease Mother        stents   ADD / ADHD Mother    Arthritis Mother    Asthma Mother    Depression Mother    Vision loss Mother    Heart disease Father    Hypertension Father    Prostate cancer Father 24   Parkinson's disease Father    Cancer Father 75   Hearing loss Father    Stroke Father    Vision loss Father    Varicose Veins Father    Stroke Maternal Grandmother    Prostate  cancer Maternal Grandfather    Asthma Maternal Grandfather    Cancer Maternal Grandfather    Cancer Paternal Grandfather        EYE   Hearing loss Paternal Grandfather    ADD / ADHD Son        ADHD   Other Son 24       part of 1 lung removed- due to infection   Alcohol abuse Son    Depression Son    Alcohol abuse Son    Alcohol abuse Maternal Uncle    Depression Maternal Uncle    Asthma Maternal Uncle    Heart disease Paternal Uncle    Colon cancer Neg Hx    Esophageal cancer Neg Hx    Rectal cancer Neg Hx    Stomach cancer Neg Hx     Social History   Socioeconomic History   Marital status: Married    Spouse name: Not on file   Number of children: 2   Years of education: 16   Highest education level: Bachelor's degree (e.g., BA, AB, BS)  Occupational History   Occupation: Retired  Tobacco Use   Smoking status: Former    Current packs/day: 0.00    Average packs/day: 1.5 packs/day for 15.0 years (22.5 ttl pk-yrs)    Types: Cigarettes    Start date: 10/06/1978    Quit date: 10/07/1978    Years since quitting: 45.4   Smokeless tobacco: Never   Tobacco comments:    QUIT 1980  Vaping Use   Vaping status: Never Used  Substance and Sexual Activity   Alcohol use: Yes    Alcohol/week: 7.0 standard drinks of alcohol    Types: 7 Glasses of wine per week    Comment: wine with meals   Drug use: Not Currently   Sexual activity: Yes    Comment: lives with wife, 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  Other Topics Concern   Not on file  Social History Narrative   Right handed    Occasionally caffeine   Retired   Lives with wife   Social Drivers of Corporate investment banker Strain: Low Risk  (03/26/2022)   Overall Financial Resource Strain (CARDIA)    Difficulty of Paying Living Expenses: Not hard at all  Food Insecurity: No Food Insecurity (03/12/2024)   Hunger Vital Sign    Worried About Running Out of Food in the Last  Year: Never true    Ran Out of Food in the Last Year: Never true  Transportation Needs: No Transportation Needs (03/12/2024)   PRAPARE - Administrator, Civil Service (Medical): No    Lack of Transportation (Non-Medical): No  Physical Activity: Sufficiently Active (03/26/2022)  Exercise Vital Sign    Days of Exercise per Week: 5 days    Minutes of Exercise per Session: 50 min  Stress: Stress Concern Present (03/26/2022)   [employer] of Occupational Health - Occupational Stress Questionnaire    Feeling of Stress : To some extent  Social Connections: Moderately Isolated (03/02/2024)   Social Connection and Isolation Panel [NHANES]    Frequency of Communication with Friends and Family: More than three times a week    Frequency of Social Gatherings with Friends and Family: Once a week    Attends Religious Services: Never    Database administrator or Organizations: No    Attends Banker Meetings: Never    Marital Status: Married  Catering manager Violence: Not At Risk (03/12/2024)   Humiliation, Afraid, Rape, and Kick questionnaire    Fear of Current or Ex-Partner: No    Emotionally Abused: No    Physically Abused: No    Sexually Abused: No    Outpatient Medications Prior to Visit  Medication Sig Dispense Refill   acetaminophen  (TYLENOL ) 500 MG tablet Take 2 tablets (1,000 mg total) by mouth every 6 (six) hours as needed for moderate pain (pain score 4-6). 90 tablet 0   amiodarone  (PACERONE ) 200 MG tablet TAKE 1/2 TABLET BY MOUTH DAILY 45 tablet 3   benzonatate  (TESSALON ) 100 MG capsule Take 1-2 capsules (100-200 mg total) by mouth 2 (two) times daily as needed for cough. 40 capsule 1   budesonide -formoterol  (SYMBICORT ) 160-4.5 MCG/ACT inhaler Inhale 2 puffs into the lungs 2 (two) times daily. 1 each 3   carbidopa -levodopa  (SINEMET  CR) 50-200 MG tablet TAKE 1 TABLET BY MOUTH EVERYDAY AT BEDTIME 90 tablet 0   carbidopa -levodopa  (SINEMET  IR) 25-100 MG tablet 2 AT  7AM, 2 AT 11AM, Take  1 AT 3PM, Take 1 at 7 PM 540 tablet 0   cholecalciferol (VITAMIN D3) 25 MCG (1000 UNIT) tablet Take 2,000 Units by mouth daily.     divalproex  (DEPAKOTE ) 125 MG DR tablet TAKE 1 TABLET (125 MG TOTAL) BY MOUTH AT BEDTIME 90 tablet 3   ezetimibe  (ZETIA ) 10 MG tablet TAKE 1 TABLET BY MOUTH EVERY DAY 90 tablet 3   guaifenesin  (ROBITUSSIN) 100 MG/5ML syrup Take 10 mLs (200 mg total) by mouth 3 (three) times daily as needed for cough.     hydrochlorothiazide  (HYDRODIURIL ) 25 MG tablet TAKE 1 TABLET BY MOUTH EVERY DAY 90 tablet 1   levalbuterol  (XOPENEX  HFA) 45 MCG/ACT inhaler INHALE 1 TO 2 PUFFS BY MOUTH EVERY 6 HOURS AS NEEDED FOR WHEEZE 15 each 1   Melatonin 10 MG TABS Take 10 mg by mouth daily as needed.     mirabegron ER (MYRBETRIQ) 50 MG TB24 tablet Take 50 mg by mouth daily.     Multiple Vitamins-Minerals (ICAPS AREDS 2 PO) Take 1 capsule by mouth in the morning and at bedtime.     pantoprazole  (PROTONIX ) 40 MG tablet Take 1 tablet (40 mg total) by mouth daily as needed.     senna (SENOKOT) 8.6 MG TABS tablet Take 1 tablet by mouth in the morning and at bedtime.     sertraline  (ZOLOFT ) 100 MG tablet TAKE 1 TABLET BY MOUTH EVERY DAY 90 tablet 1   solifenacin (VESICARE) 5 MG tablet Take 5 mg by mouth daily.     Vitamin D , Ergocalciferol , (DRISDOL ) 1.25 MG (50000 UNIT) CAPS capsule Take 1 capsule (50,000 Units total) by mouth every 7 (seven) days. 4 capsule 4  No facility-administered medications prior to visit.    Allergies  Allergen Reactions   Albuterol  Sulfate Palpitations   Azithromycin Other (See Comments)    Hepatotoxicity   Doxycycline Hyclate Other (See Comments)    Taken with Azithromycin and had Heaptotoxicity    ROS    See HPI Objective:     Physical Exam  General: No acute distress. Awake and conversant.  Eyes: Normal conjunctiva, anicteric. Round symmetric pupils.  ENT: +bilateral TM cerumen impaction Respiratory: CTAB. Respirations are  non-labored. No wheezing.  Skin: Warm. No rashes or ulcers.  Psych: Alert and oriented. Cooperative, Appropriate mood and affect, Normal judgment.  CV: RRR. No murmur. No lower extremity edema.  MSK: Normal ambulation. No clubbing or cyanosis.      BP 119/65 (BP Location: Right Arm, Patient Position: Sitting, Cuff Size: Normal)   Pulse (!) 52   Temp (!) 97.5 F (36.4 C) (Oral)   Resp 12   SpO2 97%  Wt Readings from Last 3 Encounters:  03/05/24 180 lb (81.6 kg)  03/01/24 180 lb (81.6 kg)  01/16/24 179 lb (81.2 kg)       Assessment & Plan:   Problem List Items Addressed This Visit     Bilateral impacted cerumen - Primary   Patient made aware that they may experience temporary vertigo, temporary changes in hearing, and temporary discomfort. If these symptom last for more than 24 hours to call the clinic or proceed to the ED for further evaluation. Discussed avoiding placing objects into the ear canal for cleaning. Use Debrox to help soften this and utilize bulb syringe to remove at home as needed.    I am having Auburn O. Panetta maintain his Multiple Vitamins-Minerals (ICAPS AREDS 2 PO), Melatonin, mirabegron ER, ezetimibe , pantoprazole , acetaminophen , sertraline , amiodarone , hydrochlorothiazide , divalproex , carbidopa -levodopa , solifenacin, budesonide -formoterol , guaifenesin , levalbuterol , carbidopa -levodopa , cholecalciferol, senna, benzonatate , and Vitamin D  (Ergocalciferol ).  No orders of the defined types were placed in this encounter.

## 2024-03-18 NOTE — Pre-Procedure Instructions (Signed)
 Surgical Instructions   Your procedure is scheduled on Monday, June 23rd. Report to [facility] at 12:45 P.M., then check in with the Admitting office. Any questions or running late day of surgery: call [phone]  Questions prior to your surgery date: call [phone], Monday-Friday, 8am-4pm. If you experience any cold or flu symptoms such as cough, fever, chills, shortness of breath, etc. between now and your scheduled surgery, please notify us  at the above number.     Remember:  Do not eat after midnight the night before your surgery  You may drink clear liquids until 11:45 AM the morning of your surgery.   Clear liquids allowed are: Water, Non-Citrus Juices (without pulp), Carbonated Beverages, Clear Tea (no milk, honey, etc.), Black Coffee Only (NO MILK, CREAM OR POWDERED CREAMER of any kind), and Gatorade.    Take these medicines the morning of surgery with A SIP OF WATER  amiodarone  (PACERONE )  budesonide -formoterol  (SYMBICORT )  carbidopa -levodopa  (SINEMET  IR)  ezetimibe  (ZETIA )  mirabegron ER (MYRBETRIQ)  sertraline  (ZOLOFT )  solifenacin (VESICARE)    May take these medicines IF NEEDED: acetaminophen  (TYLENOL )  benzonatate  (TESSALON )  levalbuterol  (XOPENEX  HFA)- bring inhaler with you on day of surgery  pantoprazole  (PROTONIX )    One week prior to surgery, STOP taking any Aspirin (unless otherwise instructed by your surgeon) Aleve, Naproxen, Ibuprofen, Motrin, Advil, Goody's, BC's, all herbal medications, fish oil, and non-prescription vitamins.                     Do NOT Smoke (Tobacco/Vaping) for 24 hours prior to your procedure.  If you use a CPAP at night, you may bring your mask/headgear for your overnight stay.   You will be asked to remove any contacts, glasses, piercing's, hearing aid's, dentures/partials prior to surgery. Please bring cases for these items if needed.    Patients discharged the day of surgery will not be allowed to drive  home, and someone needs to stay with them for 24 hours.  SURGICAL WAITING ROOM VISITATION Patients may have no more than 2 support people in the waiting area - these visitors may rotate.   Pre-op nurse will coordinate an appropriate time for 1 ADULT support person, who may not rotate, to accompany patient in pre-op.  Children under the age of 32 must have an adult with them who is not the patient and must remain in the main waiting area with an adult.  If the patient needs to stay at the hospital during part of their recovery, the visitor guidelines for inpatient rooms apply.  Please refer to the [facility] website for the visitor guidelines for any additional information.   If you received a COVID test during your pre-op visit  it is requested that you wear a mask when out in public, stay away from anyone that may not be feeling well and notify your surgeon if you develop symptoms. If you have been in contact with anyone that has tested positive in the last 10 days please notify you surgeon.      Pre-operative 5 CHG Bathing Instructions   You can play a key role in reducing the risk of infection after surgery. Your skin needs to be as free of germs as possible. You can reduce the number of germs on your skin by washing with CHG (chlorhexidine gluconate) soap before surgery. CHG is an antiseptic soap that kills germs and continues to kill germs even after washing.   DO NOT use if you  have an allergy to chlorhexidine/CHG or antibacterial soaps. If your skin becomes reddened or irritated, stop using the CHG and notify one of our RNs at [phone].   Please shower with the CHG soap starting 4 days before surgery using the following schedule:     Please keep in mind the following:  DO NOT shave, including legs and underarms, starting the day of your first shower.   You may shave your face at any point before/day of surgery.  Place clean sheets on your bed the day you start using CHG  soap. Use a clean washcloth (not used since being washed) for each shower. DO NOT sleep with pets once you start using the CHG.   CHG Shower Instructions:  Wash your face and private area with normal soap. If you choose to wash your hair, wash first with your normal shampoo.  After you use shampoo/soap, rinse your hair and body thoroughly to remove shampoo/soap residue.  Turn the water OFF and apply about 3 tablespoons (45 ml) of CHG soap to a CLEAN washcloth.  Apply CHG soap ONLY FROM YOUR NECK DOWN TO YOUR TOES (washing for 3-5 minutes)  DO NOT use CHG soap on face, private areas, open wounds, or sores.  Pay special attention to the area where your surgery is being performed.  If you are having back surgery, having someone wash your back for you may be helpful. Wait 2 minutes after CHG soap is applied, then you may rinse off the CHG soap.  Pat dry with a clean towel  Put on clean clothes/pajamas   If you choose to wear lotion, please use ONLY the CHG-compatible lotions that are listed below.  Additional instructions for the day of surgery: DO NOT APPLY any lotions, deodorants, cologne, or perfumes.   Do not bring valuables to the hospital. [facility] is not responsible for any belongings/valuables. Do not wear nail polish, gel polish, artificial nails, or any other type of covering on natural nails (fingers and toes) Do not wear jewelry or makeup Put on clean/comfortable clothes.  Please brush your teeth.  Ask your nurse before applying any prescription medications to the skin.     CHG Compatible Lotions   Aveeno Moisturizing lotion  Cetaphil Moisturizing Cream  Cetaphil Moisturizing Lotion  Clairol Herbal Essence Moisturizing Lotion, Dry Skin  Clairol Herbal Essence Moisturizing Lotion, Extra Dry Skin  Clairol Herbal Essence Moisturizing Lotion, Normal Skin  Curel Age Defying Therapeutic Moisturizing Lotion with Alpha Hydroxy  Curel Extreme Care Body Lotion  Curel Soothing  Hands Moisturizing Hand Lotion  Curel Therapeutic Moisturizing Cream, Fragrance-Free  Curel Therapeutic Moisturizing Lotion, Fragrance-Free  Curel Therapeutic Moisturizing Lotion, Original Formula  Eucerin Daily Replenishing Lotion  Eucerin Dry Skin Therapy Plus Alpha Hydroxy Crme  Eucerin Dry Skin Therapy Plus Alpha Hydroxy Lotion  Eucerin Original Crme  Eucerin Original Lotion  Eucerin Plus Crme Eucerin Plus Lotion  Eucerin TriLipid Replenishing Lotion  Keri Anti-Bacterial Hand Lotion  Keri Deep Conditioning Original Lotion Dry Skin Formula Softly Scented  Keri Deep Conditioning Original Lotion, Fragrance Free Sensitive Skin Formula  Keri Lotion Fast Absorbing Fragrance Free Sensitive Skin Formula  Keri Lotion Fast Absorbing Softly Scented Dry Skin Formula  Keri Original Lotion  Keri Skin Renewal Lotion Keri Silky Smooth Lotion  Keri Silky Smooth Sensitive Skin Lotion  Nivea Body Creamy Conditioning Oil  Nivea Body Extra Enriched Teacher, adult education Moisturizing Lotion Nivea Crme  Nivea Skin Firming Lotion  NutraDerm  30 Skin Lotion  NutraDerm Skin Lotion  NutraDerm Therapeutic Skin Cream  NutraDerm Therapeutic Skin Lotion  ProShield Protective Hand Cream  Provon moisturizing lotion  Please read over the following fact sheets that you were given.

## 2024-03-19 ENCOUNTER — Other Ambulatory Visit: Payer: Self-pay

## 2024-03-19 ENCOUNTER — Encounter (HOSPITAL_COMMUNITY): Payer: Self-pay

## 2024-03-19 ENCOUNTER — Encounter (HOSPITAL_COMMUNITY)
Admission: RE | Admit: 2024-03-19 | Discharge: 2024-03-19 | Disposition: A | Discharge status: Home / Self Care | Source: Ambulatory Visit | Attending: Neurosurgery | Admitting: Neurosurgery

## 2024-03-19 ENCOUNTER — Other Ambulatory Visit: Admitting: Neurology

## 2024-03-19 DIAGNOSIS — G3184 Mild cognitive impairment, so stated: Secondary | ICD-10-CM | POA: Diagnosis not present

## 2024-03-19 DIAGNOSIS — Z9889 Other specified postprocedural states: Secondary | ICD-10-CM | POA: Insufficient documentation

## 2024-03-19 DIAGNOSIS — Z9181 History of falling: Secondary | ICD-10-CM | POA: Insufficient documentation

## 2024-03-19 DIAGNOSIS — I480 Paroxysmal atrial fibrillation: Secondary | ICD-10-CM | POA: Diagnosis not present

## 2024-03-19 DIAGNOSIS — G912 (Idiopathic) normal pressure hydrocephalus: Secondary | ICD-10-CM | POA: Diagnosis not present

## 2024-03-19 DIAGNOSIS — G4733 Obstructive sleep apnea (adult) (pediatric): Secondary | ICD-10-CM | POA: Diagnosis not present

## 2024-03-19 DIAGNOSIS — H3530 Unspecified macular degeneration: Secondary | ICD-10-CM | POA: Diagnosis not present

## 2024-03-19 DIAGNOSIS — I10 Essential (primary) hypertension: Secondary | ICD-10-CM | POA: Diagnosis not present

## 2024-03-19 DIAGNOSIS — R2681 Unsteadiness on feet: Secondary | ICD-10-CM | POA: Insufficient documentation

## 2024-03-19 DIAGNOSIS — K219 Gastro-esophageal reflux disease without esophagitis: Secondary | ICD-10-CM | POA: Insufficient documentation

## 2024-03-19 DIAGNOSIS — Z01812 Encounter for preprocedural laboratory examination: Secondary | ICD-10-CM | POA: Insufficient documentation

## 2024-03-19 DIAGNOSIS — D649 Anemia, unspecified: Secondary | ICD-10-CM | POA: Insufficient documentation

## 2024-03-19 DIAGNOSIS — R1310 Dysphagia, unspecified: Secondary | ICD-10-CM | POA: Insufficient documentation

## 2024-03-19 DIAGNOSIS — Z8673 Personal history of transient ischemic attack (TIA), and cerebral infarction without residual deficits: Secondary | ICD-10-CM | POA: Diagnosis not present

## 2024-03-19 DIAGNOSIS — G20A1 Parkinson's disease without dyskinesia, without mention of fluctuations: Secondary | ICD-10-CM | POA: Diagnosis not present

## 2024-03-19 DIAGNOSIS — Z87891 Personal history of nicotine dependence: Secondary | ICD-10-CM | POA: Diagnosis not present

## 2024-03-19 DIAGNOSIS — Z01818 Encounter for other preprocedural examination: Secondary | ICD-10-CM | POA: Diagnosis present

## 2024-03-19 HISTORY — DX: Gastro-esophageal reflux disease without esophagitis: K21.9

## 2024-03-19 NOTE — Pre-Procedure Instructions (Signed)
 Surgical Instructions   Your procedure is scheduled on Monday, June 23rd. Report to [facility] at 12:45 P.M., then check in with the Admitting office. Any questions or running late day of surgery: call [phone]  Questions prior to your surgery date: call [phone], Monday-Friday, 8am-4pm. If you experience any cold or flu symptoms such as cough, fever, chills, shortness of breath, etc. between now and your scheduled surgery, please notify us  at the above number.     Remember:  Do not eat after midnight the night before your surgery   You may drink clear liquids until 11:45 AM the morning of your surgery.   Clear liquids allowed are: Water, Non-Citrus Juices (without pulp), Carbonated Beverages, Clear Tea (no milk, honey, etc.), Black Coffee Only (NO MILK, CREAM OR POWDERED CREAMER of any kind), and Gatorade.    Take these medicines the morning of surgery with A SIP OF WATER  amiodarone  (PACERONE )  budesonide -formoterol  (SYMBICORT )  carbidopa -levodopa  (SINEMET  IR)  ezetimibe  (ZETIA )  mirabegron ER (MYRBETRIQ)  sertraline  (ZOLOFT )  solifenacin (VESICARE)      May take these medicines IF NEEDED: acetaminophen  (TYLENOL )  benzonatate  (TESSALON )  levalbuterol  (XOPENEX  HFA)- bring inhaler with you on day of surgery  pantoprazole  (PROTONIX )     One week prior to surgery, STOP taking any Aspirin (unless otherwise instructed by your surgeon) Aleve, Naproxen, Ibuprofen, Motrin, Advil, Goody's, BC's, all herbal medications, fish oil, and non-prescription vitamins.                     Do NOT Smoke (Tobacco/Vaping) for 24 hours prior to your procedure.  If you use a CPAP at night, you may bring your mask/headgear for your overnight stay.   You will be asked to remove any contacts, glasses, piercing's, hearing aid's, dentures/partials prior to surgery. Please bring cases for these items if needed.    Patients discharged the day of surgery will not be allowed to  drive home, and someone needs to stay with them for 24 hours.  SURGICAL WAITING ROOM VISITATION Patients may have no more than 2 support people in the waiting area - these visitors may rotate.   Pre-op nurse will coordinate an appropriate time for 1 ADULT support person, who may not rotate, to accompany patient in pre-op.  Children under the age of 94 must have an adult with them who is not the patient and must remain in the main waiting area with an adult.  If the patient needs to stay at the hospital during part of their recovery, the visitor guidelines for inpatient rooms apply.  Please refer to the [facility] website for the visitor guidelines for any additional information.   If you received a COVID test during your pre-op visit  it is requested that you wear a mask when out in public, stay away from anyone that may not be feeling well and notify your surgeon if you develop symptoms. If you have been in contact with anyone that has tested positive in the last 10 days please notify you surgeon.      Pre-operative CHG Bathing Instructions   You can play a key role in reducing the risk of infection after surgery. Your skin needs to be as free of germs as possible. You can reduce the number of germs on your skin by washing with CHG (chlorhexidine gluconate) soap before surgery. CHG is an antiseptic soap that kills germs and continues to kill germs even after washing.   DO NOT  use if you have an allergy to chlorhexidine/CHG or antibacterial soaps. If your skin becomes reddened or irritated, stop using the CHG and notify one of our RNs at [phone].              TAKE A SHOWER THE NIGHT BEFORE SURGERY AND THE DAY OF SURGERY    Please keep in mind the following:  DO NOT shave, including legs and underarms, 48 hours prior to surgery.   You may shave your face before/day of surgery.  Place clean sheets on your bed the night before surgery Use a clean washcloth (not used since being  washed) for each shower. DO NOT sleep with pet's night before surgery.  CHG Shower Instructions:  Wash your face and private area with normal soap. If you choose to wash your hair, wash first with your normal shampoo.  After you use shampoo/soap, rinse your hair and body thoroughly to remove shampoo/soap residue.  Turn the water OFF and apply half the bottle of CHG soap to a CLEAN washcloth.  Apply CHG soap ONLY FROM YOUR NECK DOWN TO YOUR TOES (washing for 3-5 minutes)  DO NOT use CHG soap on face, private areas, open wounds, or sores.  Pay special attention to the area where your surgery is being performed.  If you are having back surgery, having someone wash your back for you may be helpful. Wait 2 minutes after CHG soap is applied, then you may rinse off the CHG soap.  Pat dry with a clean towel  Put on clean pajamas    Additional instructions for the day of surgery: DO NOT APPLY any lotions, deodorants, cologne, or perfumes.   Do not wear jewelry or makeup Do not wear nail polish, gel polish, artificial nails, or any other type of covering on natural nails (fingers and toes) Do not bring valuables to the hospital. [facility] is not responsible for valuables/personal belongings. Put on clean/comfortable clothes.  Please brush your teeth.  Ask your nurse before applying any prescription medications to the skin.

## 2024-03-19 NOTE — Progress Notes (Signed)
 PCP - Dr. Randie Bustle Cardiologist - Dr. Lauro Portal  PPM/ICD - denies   Chest x-ray - 03/01/24 EKG - 12/23/23 Stress Test - 04/23/18 ECHO - 02/28/18 Cardiac Cath - denies  Sleep Study - Pt states he has mild OSA, no CPAP recommended/needed CPAP - denies  DM- denies  Last dose of GLP1 agonist-  n/a   ASA/Blood Thinner Instructions: n/a   ERAS Protcol - clears until 1145   COVID TEST- n/a   Anesthesia review: yes, cardiac hx  Patient denies shortness of breath, fever, cough and chest pain at PAT appointment   All instructions explained to the patient, with a verbal understanding of the material. Patient agrees to go over the instructions while at home for a better understanding.  The opportunity to ask questions was provided.

## 2024-03-22 ENCOUNTER — Encounter (HOSPITAL_COMMUNITY): Payer: Self-pay

## 2024-03-22 ENCOUNTER — Telehealth: Payer: Self-pay | Admitting: Neurology

## 2024-03-22 ENCOUNTER — Telehealth: Payer: Self-pay | Admitting: Psychology

## 2024-03-22 DIAGNOSIS — I480 Paroxysmal atrial fibrillation: Secondary | ICD-10-CM | POA: Diagnosis not present

## 2024-03-22 DIAGNOSIS — D696 Thrombocytopenia, unspecified: Secondary | ICD-10-CM | POA: Diagnosis not present

## 2024-03-22 DIAGNOSIS — G3184 Mild cognitive impairment, so stated: Secondary | ICD-10-CM | POA: Diagnosis not present

## 2024-03-22 DIAGNOSIS — Z87440 Personal history of urinary (tract) infections: Secondary | ICD-10-CM | POA: Diagnosis not present

## 2024-03-22 DIAGNOSIS — I10 Essential (primary) hypertension: Secondary | ICD-10-CM | POA: Diagnosis not present

## 2024-03-22 DIAGNOSIS — Z7951 Long term (current) use of inhaled steroids: Secondary | ICD-10-CM | POA: Diagnosis not present

## 2024-03-22 DIAGNOSIS — G912 (Idiopathic) normal pressure hydrocephalus: Secondary | ICD-10-CM | POA: Diagnosis not present

## 2024-03-22 DIAGNOSIS — J449 Chronic obstructive pulmonary disease, unspecified: Secondary | ICD-10-CM | POA: Diagnosis not present

## 2024-03-22 DIAGNOSIS — G20A2 Parkinson's disease without dyskinesia, with fluctuations: Secondary | ICD-10-CM | POA: Diagnosis not present

## 2024-03-22 NOTE — Progress Notes (Addendum)
 Anesthesia Chart Review:  Case: [id number] Date/Time: 03/29/24 1447   Procedure: SHUNT INSERTION VENTRICULAR-PERITONEAL (Right) - RT VP SHUNT PLACEMENT   Anesthesia type: General   Diagnosis: Normal pressure hydrocephalus (HCC) [G91.2]   Pre-op diagnosis: NPH   Location: [facility] ROOM 20 / [facility]   Surgeons: Garry Kansas, MD       DISCUSSION: Patient is an 83 year old male scheduled for the above procedure. Diagnosis: normal pressure hydrocephalus.  Other history includes former smoker (quit 1980), Parkinson's disease, mild cognitive impairment, HTN, coronary calcifications (09/2019 chest CT, negative exercise Myoview  stress 04/23/18), PAF, bradycardia, TIA (2019), OSA (mild, does not use CPAP), elevated LFTs (11/2017, suspected drug induced from abx), thrombocytopenia (mild), anemia, dysphagia, GERD, unsteady gait/recurrent falls, macular degeneration, skin cancer excision, bilateral hydrocelectomy (04/23/11).   [facility] admission 03/01/24-03/04/24 for acute encephalopathy and severe sepsis in setting of UTI with lactic acidosis. 03/02/24 UA positive for nitrates and bacteria and was started on ceftriaxone . Urine culture sent which showed insignificant growth. Blood cultures sent with one of two blood cultures positive for Enterobacterales, E. Coli, sensitive to ceftriaxone . Sepsis criteria resolved quickly and mental statu improved to baseline., Discharged on cefadroxil  to complete 5 day course of antibiotics. He had PCP follow-up with Dr. Rodrick Clapper on 03/08/24 and was doing much better.  He had evaluation with Dr. Larrie Po on 03/09/24 for ventriculomegaly on imaging, initially felt due to atropy, but more recently thought ventricles were out of proportion to atrophy. Known Parkinson's disease, but with increasing memory difficulties, falls, urinary incontinence. On 01/16/24 he underwent  high volume LP, and PT felt he did better. Per 01/27/24 visit with neurologist Dr. Winferd Hatter, I am very leery about the idea of  undergoing shunt in the presence of an abnormal DaTscan, although patient's could possibly have an abnormal DaTscan with nph. Initially discussed undergoing a skin biopsy for alpha-synuclein first, but after meeting with neurosurgeon he opted to forgo skin biopsy and proceed with VP shunt.   Last cardiology visit with Dr. Katheryne Pane was on 11/05/23. Known PAF and coronary calcification history. No chest pain or SOB. Recent mechanical falls with progressive Parkinson's symptoms. Joint decision to discontinue Eliquis  due to bleeding risks in setting of increased fall risk. 12 month follow-up planned. He remains on amiodarone .  As above, recent admission with sepsis felt related to UTI with E.coli in one of two cultures (the negative culture had inadequate volume). He improved on appropriate antibiotic therapy and had evaluation with both PCP and Dr. Larrie Po afterwards. Since unclear if Dr. Larrie Po' was aware, I notified Landa Pine  of + blood culture from 03/01/24 (s/p treatment) in case it would influence timing of procedure from a neurosurgery standpoint, although he will be nearly one month out by his current surgery date. She will have Dr. Larrie Po review.    Anesthesia team to evaluate on the day of surgery.    VS: BP 118/68   Pulse 67   Temp 36.7 C   Resp 16   Ht 5' 5 (1.651 m)   Wt 72.6 kg   SpO2 97%   BMI 26.63 kg/m    PROVIDERS: Neda Balk, MD is PCP  Tat, Ivette Marks, DO is neurologist Lauro Portal, MD is cardiologist   LABS:  Lab Results  Component Value Date   WBC 8.1 03/08/2024   HGB 13.5 03/08/2024   HCT 39.9 03/08/2024   PLT 224.0 03/08/2024   GLUCOSE 96 03/08/2024   CHOL 166 02/04/2024   TRIG 92.0 02/04/2024  HDL 69.30 02/04/2024   LDLCALC 79 02/04/2024   ALT 10 03/08/2024   AST 16 03/08/2024   NA 141 03/08/2024   K 3.6 03/08/2024   CL 102 03/08/2024   CREATININE 0.91 03/08/2024   BUN 12 03/08/2024   CO2 30 03/08/2024   TSH 1.74 02/04/2024   INR 1.1 03/01/2024    HGBA1C 5.2 02/04/2024     IMAGES: CT Head 03/02/24: IMPRESSION: 1. No acute intracranial abnormality. 2. Atrophy with chronic small vessel ischemic disease. 3. Ventriculomegaly, similar to prior and potentially related to central atrophy but component of underlying hydrocephalus not excluded. 4. 8 mm extra-axial left frontal dural-based lesion is similar to prior and comparing back to an older study from 10/06/2023, compatible with small meningioma.   CXR 03/01/24: FINDINGS: Stable cardiomediastinal silhouette. Low lung volumes accentuate pulmonary vascularity. No focal consolidation, pleural effusion, or pneumothorax. No displaced rib fractures. IMPRESSION: Low lung volumes without acute cardiopulmonary process.   CT Head/C-spine 11/04/23: MPRESSION: 1.  No acute intracranial abnormality. 2. No acute displaced fracture or traumatic listhesis of the cervical spine. 3. Stable prominence of the lateral ventricles may be related to central predominant atrophy, although a component of normal pressure/communicating hydrocephalus cannot be excluded.   EKG:  EKG 12/23/23: Ventricular rate 78 bpm Atrial flutter with varied AV block, Borderline repolarization abnormality Confirmed by Owen Blowers ([id number]) on 12/25/2023 1:49:35 PM  EKG 11/05/23: NSR   CV: Zio XT 08/29/20 -09/11/20: 1. SR/SB (occasionally in the 30s in AM hours)/ St  2. PACs and short runs of SVT  3. Runs of Aflutter with variable VR    Nuclear stress test 04/23/18: Nuclear stress EF: 55%. Blood pressure demonstrated a normal response to exercise. There was no ST segment deviation noted during stress. The study is normal. This is a low risk study. The left ventricular ejection fraction is normal (55-65%).   Normal exercise nuclear stress test with no evidence for prior infarct or ischemia.    Past Medical History:  Diagnosis Date   A-fib [facility])    Allergy    Anemia    mild   Arthritis 04/06/2017    Asthma    childhood   ASVD (arteriosclerotic vascular disease) 09/07/2018   Back pain with radiation 04/26/2013   Low back with LLE radiculopathy   Basal cell carcinoma    skin- on nose- basal cell (20 yrs ago) forehead 1 year ago     Sees Dr Thais Fill of dermatology   BPH (benign prostatic hyperplasia)    Bradycardia 06/28/2018   Cataract 10/07/2014   Cerumen impaction 08/10/2012   Chicken pox as child   Coronary artery calcification 11/23/2019   Dysphagia 09/11/2021   Elevated LFTs    Essential hypertension    ETD (Eustachian tube dysfunction), right 01/17/2022   Generalized anxiety disorder    GERD (gastroesophageal reflux disease)    [place] measles as a child   Hemoptysis 01/17/2022   Hyperglycemia 04/25/2020   Hyperlipidemia    Insomnia 02/06/2021   Internal hemorrhoids    Irregular cardiac rhythm 03/10/2018   Lipoma 08/04/2018   Macular degeneration    Major depressive disorder 08/01/2012   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   Nocturia 12/04/2022   Obstructive sleep apnea 11/09/2018   Patient reports mild symptoms; was not prescribed a CPAP machine   Otitis externa 08/10/2012   PAF (paroxysmal atrial fibrillation)    Palpitations 09/07/2018   Parkinson's disease (HCC) 05/19/2019   Recurrent falls 12/04/2022  Thrombocytopenia    TIA (transient ischemic attack) 03/10/2018   Unsteady gait 09/11/2021    Past Surgical History:  Procedure Laterality Date   BELPHAROPTOSIS REPAIR     very young, b/l   EXCISIONAL HEMORRHOIDECTOMY     EYE SURGERY  2017   eyelids   HYDROCELE EXCISION / REPAIR  2012   b/l   SKIN CANCER EXCISION     nose and forehead, basal cell CA   UPPER GASTROINTESTINAL ENDOSCOPY      MEDICATIONS:  acetaminophen  (TYLENOL ) 500 MG tablet   amiodarone  (PACERONE ) 200 MG tablet   benzonatate  (TESSALON ) 100 MG capsule   budesonide -formoterol  (SYMBICORT ) 160-4.5 MCG/ACT inhaler   carbidopa -levodopa  (SINEMET   CR) 50-200 MG tablet   carbidopa -levodopa  (SINEMET  IR) 25-100 MG tablet   cholecalciferol (VITAMIN D3) 25 MCG (1000 UNIT) tablet   divalproex  (DEPAKOTE ) 125 MG DR tablet   ezetimibe  (ZETIA ) 10 MG tablet   guaifenesin  (ROBITUSSIN) 100 MG/5ML syrup   hydrochlorothiazide  (HYDRODIURIL ) 25 MG tablet   levalbuterol  (XOPENEX  HFA) 45 MCG/ACT inhaler   Melatonin 10 MG TABS   mirabegron ER (MYRBETRIQ) 50 MG TB24 tablet   Multiple Vitamins-Minerals (ICAPS AREDS 2 PO)   pantoprazole  (PROTONIX ) 40 MG tablet   senna (SENOKOT) 8.6 MG TABS tablet   sertraline  (ZOLOFT ) 100 MG tablet   solifenacin (VESICARE) 5 MG tablet   Vitamin D , Ergocalciferol , (DRISDOL ) 1.25 MG (50000 UNIT) CAPS capsule   No current facility-administered medications for this encounter.    Ella Gun, PA-C Surgical Short Stay/Anesthesiology [facility] Phone [phone] [facility] Phone [phone] 03/22/2024 3:17 PM

## 2024-03-22 NOTE — Telephone Encounter (Signed)
 Spoke with patient wife trying to get him schedule for the Skin biopsy with Dr Tat. She asked if that was something that has to be done and I informed her after speaking to Dr Tat that it was the patient choice. The skin Biopsy would show if he had parkinson the patient wife said they knew he had parkinson.She said that they did not want to move forward with the Skin Biopsy.

## 2024-03-22 NOTE — Anesthesia Preprocedure Evaluation (Addendum)
 Anesthesia Evaluation  Patient identified by MRN, date of birth, ID band Patient awake    Reviewed: Allergy & Precautions, NPO status , Patient's Chart, lab work & pertinent test results  Airway Mallampati: III  TM Distance: >3 FB Neck ROM: Full    Dental no notable dental hx.    Pulmonary asthma , sleep apnea , former smoker   Pulmonary exam normal        Cardiovascular hypertension, Normal cardiovascular exam+ dysrhythmias Atrial Fibrillation      Neuro/Psych  PSYCHIATRIC DISORDERS Anxiety Depression    Parkinson's disease  TIA   GI/Hepatic negative GI ROS, Neg liver ROS,,,  Endo/Other  negative endocrine ROS    Renal/GU negative Renal ROS     Musculoskeletal  (+) Arthritis ,    Abdominal   Peds  Hematology negative hematology ROS (+)   Anesthesia Other Findings Normal pressure hydrocephalus  Reproductive/Obstetrics                              Anesthesia Physical Anesthesia Plan  ASA: 3  Anesthesia Plan: General   Post-op Pain Management:    Induction: Intravenous  PONV Risk Score and Plan: 2 and Ondansetron , Dexamethasone  and Treatment may vary due to age or medical condition  Airway Management Planned: Oral ETT  Additional Equipment:   Intra-op Plan:   Post-operative Plan: Extubation in OR  Informed Consent: I have reviewed the patients History and Physical, chart, labs and discussed the procedure including the risks, benefits and alternatives for the proposed anesthesia with the patient or authorized representative who has indicated his/her understanding and acceptance.     Dental advisory given  Plan Discussed with: CRNA  Anesthesia Plan Comments: (PAT note written 03/22/2024 by Allison Zelenak, PA-C.  )        Anesthesia Quick Evaluation

## 2024-03-22 NOTE — Telephone Encounter (Signed)
 Pt. Cncld appt as Pt will have  Procedure Laterality Anesthesia  SHUNT INSERTION VENTRICULAR-PERITONEAL    03/29/24 Please evaluate when Pt can come back and let front desk know when to sched Pt.

## 2024-03-23 DIAGNOSIS — I480 Paroxysmal atrial fibrillation: Secondary | ICD-10-CM | POA: Diagnosis not present

## 2024-03-23 DIAGNOSIS — D696 Thrombocytopenia, unspecified: Secondary | ICD-10-CM | POA: Diagnosis not present

## 2024-03-23 DIAGNOSIS — G20A2 Parkinson's disease without dyskinesia, with fluctuations: Secondary | ICD-10-CM | POA: Diagnosis not present

## 2024-03-23 DIAGNOSIS — Z87440 Personal history of urinary (tract) infections: Secondary | ICD-10-CM | POA: Diagnosis not present

## 2024-03-23 DIAGNOSIS — G912 (Idiopathic) normal pressure hydrocephalus: Secondary | ICD-10-CM | POA: Diagnosis not present

## 2024-03-23 DIAGNOSIS — Z7951 Long term (current) use of inhaled steroids: Secondary | ICD-10-CM | POA: Diagnosis not present

## 2024-03-23 DIAGNOSIS — I10 Essential (primary) hypertension: Secondary | ICD-10-CM | POA: Diagnosis not present

## 2024-03-23 DIAGNOSIS — G3184 Mild cognitive impairment, so stated: Secondary | ICD-10-CM | POA: Diagnosis not present

## 2024-03-23 DIAGNOSIS — J449 Chronic obstructive pulmonary disease, unspecified: Secondary | ICD-10-CM | POA: Diagnosis not present

## 2024-03-26 ENCOUNTER — Other Ambulatory Visit: Payer: Self-pay

## 2024-03-26 NOTE — Patient Outreach (Signed)
 Complex Care Management   Visit Note  03/26/2024  Name:  Walter Brown MRN: [id number] DOB: 07-04-1941  Situation: Referral received for Complex Care Management related to Parkinson's Disease I obtained verbal consent from Patient.  Visit completed with patient and spouse  on the phone  Background:   Past Medical History:  Diagnosis Date   A-fib [facility])    Allergy    Anemia    mild   Arthritis 04/06/2017   Asthma    childhood   ASVD (arteriosclerotic vascular disease) 09/07/2018   Back pain with radiation 04/26/2013   Low back with LLE radiculopathy   Basal cell carcinoma    skin- on nose- basal cell (20 yrs ago) forehead 1 year ago     Sees Dr Thais Fill of dermatology   BPH (benign prostatic hyperplasia)    Bradycardia 06/28/2018   Cataract 10/07/2014   Cerumen impaction 08/10/2012   Chicken pox as child   Coronary artery calcification 11/23/2019   Dysphagia 09/11/2021   Elevated LFTs 11/2017   suspected drug/antibiotic induced   Essential hypertension    ETD (Eustachian tube dysfunction), right 01/17/2022   Generalized anxiety disorder    GERD (gastroesophageal reflux disease)    [place] measles as a child   Hemoptysis 01/17/2022   Hyperglycemia 04/25/2020   Hyperlipidemia    Insomnia 02/06/2021   Internal hemorrhoids    Irregular cardiac rhythm 03/10/2018   Lipoma 08/04/2018   Macular degeneration    Major depressive disorder 08/01/2012   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   Nocturia 12/04/2022   Obstructive sleep apnea 11/09/2018   Patient reports mild symptoms; was not prescribed a CPAP machine   Otitis externa 08/10/2012   PAF (paroxysmal atrial fibrillation)    Palpitations 09/07/2018   Parkinson's disease (HCC) 05/19/2019   Recurrent falls 12/04/2022   Thrombocytopenia    TIA (transient ischemic attack) 03/10/2018   Unsteady gait 09/11/2021    Assessment: Patient Reported Symptoms:  Cognitive Cognitive Status:  Alert and oriented to person, place, and time      Neurological Neurological Review of Symptoms: Not assessed Neurological Conditions: Parkinson's disease Neurological Management Strategies: Medication therapy, Routine screening  HEENT HEENT Symptoms Reported: Change or loss of hearing (patient seen for ear lavage, reports no change to hearing loss symptoms) HEENT Conditions: Ear problem(s) HEENT Management Strategies: Routine screening Ear problem(s)  Cardiovascular Cardiovascular Symptoms Reported: No symptoms reported Does patient have uncontrolled Hypertension?: No Cardiovascular Conditions: Hypertension, Dysrhythmia Cardiovascular Management Strategies: Medication therapy, Routine screening  Respiratory Respiratory Symptoms Reported: Dry cough Respiratory Conditions: Cough  Endocrine Patient reports the following symptoms related to hypoglycemia or hyperglycemia : No symptoms reported Is patient diabetic?: No    Gastrointestinal Gastrointestinal Symptoms Reported: Constipation Additional Gastrointestinal Details: managed with Senna Gastrointestinal Conditions: Constipation Gastrointestinal Management Strategies: Medication therapy, Diet modification, Fluid modification    Genitourinary Genitourinary Symptoms Reported: No symptoms reported    Integumentary Integumentary Symptoms Reported: No symptoms reported    Musculoskeletal Musculoskelatal Symptoms Reviewed: Difficulty walking, Unsteady gait Additional Musculoskeletal Details: patient scheduled for shunt placement, will resume PT/OT after discharge Musculoskeletal Conditions: Mobility limited Musculoskeletal Management Strategies: Medication therapy, Routine screening, Exercise, Medical device (uses cane and walker, has electric scooter) Falls in the past year?: No Patient at Risk for Falls Due to: Impaired balance/gait, Impaired mobility  Psychosocial Psychosocial Symptoms Reported: No symptoms reported             03/16/2024    3:33 PM  Depression  screen PHQ 2/9  Decreased Interest 0  Down, Depressed, Hopeless 0  PHQ - 2 Score 0  Altered sleeping 0  Tired, decreased energy 0  Change in appetite 0  Feeling bad or failure about yourself  0  Trouble concentrating 0  Moving slowly or fidgety/restless 0  Suicidal thoughts 0  PHQ-9 Score 0    There were no vitals filed for this visit.  Medications Reviewed Today     Reviewed by Clarnce Crow, RN (Registered Nurse) on 03/26/24 at 1406  Med List Status: <None>   Medication Order Taking? Sig Documenting Provider Last Dose Status Informant  acetaminophen  (TYLENOL ) 500 MG tablet [id number] No Take 2 tablets (1,000 mg total) by mouth every 6 (six) hours as needed for moderate pain (pain score 4-6). Debbra Fairy, PA-C Taking Active Spouse/Significant Other, Care Giver  amiodarone  (PACERONE ) 200 MG tablet [id number] No TAKE 1/2 TABLET BY MOUTH DAILY Avanell Leigh, MD Taking Active Spouse/Significant Other, Care Giver  benzonatate  (TESSALON ) 100 MG capsule [id number] No Take 1-2 capsules (100-200 mg total) by mouth 2 (two) times daily as needed for cough. Neda Balk, MD Taking Active   budesonide -formoterol  (SYMBICORT ) 160-4.5 MCG/ACT inhaler [id number] No Inhale 2 puffs into the lungs 2 (two) times daily. Ezell Hollow, MD Taking Active Spouse/Significant Other, Care Giver  carbidopa -levodopa  (SINEMET  CR) 50-200 MG tablet [id number] No TAKE 1 TABLET BY MOUTH EVERYDAY AT BEDTIME Tat, Von Grumbling, DO Taking Active Spouse/Significant Other, Care Giver           Med Note (WHITE, Nieves Bars   Mon Mar 01, 2024 10:55 PM)    carbidopa -levodopa  (SINEMET  IR) 25-100 MG tablet [id number] No 2 AT 7AM, 2 AT 11AM, Take  1 AT 3PM, Take 1 at 7 PM Tat, Rebecca S, DO Taking Active Spouse/Significant Other, Care Giver  cholecalciferol (VITAMIN D3) 25 MCG (1000 UNIT) tablet [id number] No Take 2,000 Units by mouth daily. [provider] Taking Active Spouse/Significant Other,  Care Giver  divalproex  (DEPAKOTE ) 125 MG DR tablet [id number] No TAKE 1 TABLET (125 MG TOTAL) BY MOUTH AT BEDTIME Wertman, Sara E, PA-C Taking Active Spouse/Significant Other, Care Giver  ezetimibe  (ZETIA ) 10 MG tablet [id number] No TAKE 1 TABLET BY MOUTH EVERY DAY Walker, Caitlin S, NP Taking Active Spouse/Significant Other, Care Giver  guaifenesin  (ROBITUSSIN) 100 MG/5ML syrup [id number] No Take 10 mLs (200 mg total) by mouth 3 (three) times daily as needed for cough. Ezell Hollow, MD Taking Active Spouse/Significant Other, Care Giver  hydrochlorothiazide  (HYDRODIURIL ) 25 MG tablet [id number] No TAKE 1 TABLET BY MOUTH EVERY DAY Neda Balk, MD Taking Active Spouse/Significant Other, Care Giver  levalbuterol  (XOPENEX  HFA) 45 MCG/ACT inhaler [id number] No INHALE 1 TO 2 PUFFS BY MOUTH EVERY 6 HOURS AS NEEDED FOR WHEEZE Neda Balk, MD Taking Active Spouse/Significant Other, Care Giver  Melatonin 10 MG TABS [id number] No Take 10 mg by mouth daily as needed. [provider] Taking Active Spouse/Significant Other, Care Giver  mirabegron ER (MYRBETRIQ) 50 MG TB24 tablet [id number] No Take 50 mg by mouth daily. [provider] Taking Active Spouse/Significant Other, Care Giver  Multiple Vitamins-Minerals (ICAPS AREDS 2 PO) [id number] No Take 1 capsule by mouth in the morning and at bedtime. [provider] Taking Active Spouse/Significant Other, Care Giver  pantoprazole  (PROTONIX ) 40 MG tablet [id number] No Take 1 tablet (40 mg total) by mouth daily as needed. Dorrene Gaucher, NP Taking Active Spouse/Significant Other, Care Giver  senna (SENOKOT) 8.6 MG TABS tablet [id number]  No Take 1 tablet by mouth in the morning and at bedtime. [provider] Taking Active Spouse/Significant Other, Care Giver  sertraline  (ZOLOFT ) 100 MG tablet [id number] No TAKE 1 TABLET BY MOUTH EVERY DAY Neda Balk, MD Taking Active Spouse/Significant Other, Care Giver  solifenacin (VESICARE) 5  MG tablet [id number] No Take 5 mg by mouth daily. [provider] Taking Active Spouse/Significant Other, Care Giver  Vitamin D , Ergocalciferol , (DRISDOL ) 1.25 MG (50000 UNIT) CAPS capsule [id number] No Take 1 capsule (50,000 Units total) by mouth every 7 (seven) days. Neda Balk, MD Taking Active             Recommendation:   Continue Current Plan of Care  Follow Up Plan:   Telephone follow up appointment date/time:  04/02/24 at 2:00   Clarnce Crow BSN RN CCM [place]  [facility], [facility] RN Care Manager Direct Dial: [phone] Fax: [phone]

## 2024-03-26 NOTE — Patient Instructions (Signed)
 Visit Information  Thank you for taking time to visit with me today. Please don't hesitate to contact me if I can be of assistance to you before our next scheduled appointment.  Our next appointment is by telephone on 04/02/24 at 2:00 Please call the care guide team at [phone] if you need to cancel or reschedule your appointment.   Following is a copy of your care plan:   Goals Addressed             This Visit's Progress    VBCI RN Care Plan   No change    Problems:  Chronic Disease Management support and education needs related to Parkinsons Disease  Goal: Over the next 30 days the Patient will attend all scheduled medical appointments: with PCP and Neurology as evidenced by chart review and patient report        continue to work with RN Care Manager and/or Social Worker to address care management and care coordination needs related to Parkinsons Disease as evidenced by adherence to care management team scheduled appointments     not experience hospital admission as evidenced by review of electronic medical record. Hospital Admissions in last 6 months = 1 take all medications exactly as prescribed and will call provider for medication related questions as evidenced by chart review and patient report    verbalize basic understanding of Parkinsons Disease disease process and self health management plan as evidenced by patient asking questions and engaging in discussion with providers Patient will contact Neurology provider to schedule shunt placement for Normal Pressure Hydrocephalus  Interventions:   Evaluation of current treatment plan related to Parkinsons Disease, Transportation, Level of care concerns, and Inability to perform ADL's independently self-management and patient's adherence to plan as established by provider. Discussed plans with patient for ongoing care management follow up and provided patient with direct contact information for care management team Reviewed  medications with patient and discussed future caregiving needs  Confirmed patient has [facility] PT/OT coming 1x weekly (will be paused and resume after hospital discharge) Discussed plan to have stent placement to treat NPH (scheduled for 03/29/24)    Patient Self-Care Activities:  Attend all scheduled provider appointments Call pharmacy for medication refills 3-7 days in advance of running out of medications Call provider office for new concerns or questions  Take medications as prescribed    Plan:  Telephone follow up appointment with care management team member scheduled for:  04/02/24 at 2:00             Please call the Suicide and Crisis Lifeline: 988 call the [place]  National Suicide Prevention Lifeline: 678-550-3905 or TTY: 905-670-3380 TTY 343-283-8626) to talk to a trained counselor call 1-800-273-TALK (toll free, 24 hour hotline) if you are experiencing a Mental Health or Behavioral Health Crisis or need someone to talk to.  Patient verbalizes understanding of instructions and care plan provided today and agrees to view in MyChart. Active MyChart status and patient understanding of how to access instructions and care plan via MyChart confirmed with patient.      Clarnce Crow BSN RN CCM [place]  [facility], [facility] RN Care Manager Direct Dial: [phone] Fax: [phone]

## 2024-03-29 ENCOUNTER — Encounter (HOSPITAL_COMMUNITY): Payer: Self-pay | Admitting: Neurosurgery

## 2024-03-29 ENCOUNTER — Inpatient Hospital Stay (HOSPITAL_COMMUNITY)
Admission: RE | Admit: 2024-03-29 | Discharge: 2024-04-01 | DRG: 032 | Disposition: A | Discharge status: Home Health | Attending: Neurosurgery | Admitting: Neurosurgery

## 2024-03-29 ENCOUNTER — Inpatient Hospital Stay (HOSPITAL_COMMUNITY)

## 2024-03-29 ENCOUNTER — Encounter (HOSPITAL_COMMUNITY)
Admission: RE | Disposition: A | Discharge status: Home Health | Payer: Self-pay | Source: Home / Self Care | Attending: Neurosurgery

## 2024-03-29 ENCOUNTER — Other Ambulatory Visit: Payer: Self-pay

## 2024-03-29 ENCOUNTER — Inpatient Hospital Stay (HOSPITAL_COMMUNITY): Payer: Self-pay | Admitting: Vascular Surgery

## 2024-03-29 DIAGNOSIS — K219 Gastro-esophageal reflux disease without esophagitis: Secondary | ICD-10-CM | POA: Diagnosis present

## 2024-03-29 DIAGNOSIS — Z79899 Other long term (current) drug therapy: Secondary | ICD-10-CM

## 2024-03-29 DIAGNOSIS — N400 Enlarged prostate without lower urinary tract symptoms: Secondary | ICD-10-CM | POA: Diagnosis not present

## 2024-03-29 DIAGNOSIS — I480 Paroxysmal atrial fibrillation: Secondary | ICD-10-CM | POA: Diagnosis not present

## 2024-03-29 DIAGNOSIS — I4891 Unspecified atrial fibrillation: Secondary | ICD-10-CM

## 2024-03-29 DIAGNOSIS — Z888 Allergy status to other drugs, medicaments and biological substances status: Secondary | ICD-10-CM | POA: Diagnosis not present

## 2024-03-29 DIAGNOSIS — Z821 Family history of blindness and visual loss: Secondary | ICD-10-CM

## 2024-03-29 DIAGNOSIS — I2510 Atherosclerotic heart disease of native coronary artery without angina pectoris: Secondary | ICD-10-CM | POA: Diagnosis present

## 2024-03-29 DIAGNOSIS — Z8249 Family history of ischemic heart disease and other diseases of the circulatory system: Secondary | ICD-10-CM

## 2024-03-29 DIAGNOSIS — Z818 Family history of other mental and behavioral disorders: Secondary | ICD-10-CM | POA: Diagnosis not present

## 2024-03-29 DIAGNOSIS — T8509XA Other mechanical complication of ventricular intracranial (communicating) shunt, initial encounter: Secondary | ICD-10-CM | POA: Diagnosis not present

## 2024-03-29 DIAGNOSIS — Z825 Family history of asthma and other chronic lower respiratory diseases: Secondary | ICD-10-CM | POA: Diagnosis not present

## 2024-03-29 DIAGNOSIS — I10 Essential (primary) hypertension: Secondary | ICD-10-CM | POA: Diagnosis not present

## 2024-03-29 DIAGNOSIS — R410 Disorientation, unspecified: Secondary | ICD-10-CM | POA: Diagnosis not present

## 2024-03-29 DIAGNOSIS — Z87891 Personal history of nicotine dependence: Secondary | ICD-10-CM

## 2024-03-29 DIAGNOSIS — Z820 Family history of epilepsy and other diseases of the nervous system: Secondary | ICD-10-CM

## 2024-03-29 DIAGNOSIS — G912 (Idiopathic) normal pressure hydrocephalus: Secondary | ICD-10-CM | POA: Diagnosis not present

## 2024-03-29 DIAGNOSIS — J45909 Unspecified asthma, uncomplicated: Secondary | ICD-10-CM | POA: Diagnosis not present

## 2024-03-29 DIAGNOSIS — G919 Hydrocephalus, unspecified: Secondary | ICD-10-CM | POA: Diagnosis not present

## 2024-03-29 DIAGNOSIS — Z822 Family history of deafness and hearing loss: Secondary | ICD-10-CM

## 2024-03-29 DIAGNOSIS — Z8042 Family history of malignant neoplasm of prostate: Secondary | ICD-10-CM | POA: Diagnosis not present

## 2024-03-29 DIAGNOSIS — T8502XA Displacement of ventricular intracranial (communicating) shunt, initial encounter: Secondary | ICD-10-CM | POA: Diagnosis not present

## 2024-03-29 DIAGNOSIS — Z7951 Long term (current) use of inhaled steroids: Secondary | ICD-10-CM

## 2024-03-29 DIAGNOSIS — Z85828 Personal history of other malignant neoplasm of skin: Secondary | ICD-10-CM | POA: Diagnosis not present

## 2024-03-29 DIAGNOSIS — Z881 Allergy status to other antibiotic agents status: Secondary | ICD-10-CM

## 2024-03-29 DIAGNOSIS — E785 Hyperlipidemia, unspecified: Secondary | ICD-10-CM | POA: Diagnosis present

## 2024-03-29 DIAGNOSIS — Z823 Family history of stroke: Secondary | ICD-10-CM | POA: Diagnosis not present

## 2024-03-29 DIAGNOSIS — F418 Other specified anxiety disorders: Secondary | ICD-10-CM | POA: Diagnosis not present

## 2024-03-29 DIAGNOSIS — G4733 Obstructive sleep apnea (adult) (pediatric): Secondary | ICD-10-CM | POA: Diagnosis not present

## 2024-03-29 DIAGNOSIS — T82838A Hemorrhage of vascular prosthetic devices, implants and grafts, initial encounter: Secondary | ICD-10-CM | POA: Diagnosis not present

## 2024-03-29 DIAGNOSIS — Y831 Surgical operation with implant of artificial internal device as the cause of abnormal reaction of the patient, or of later complication, without mention of misadventure at the time of the procedure: Secondary | ICD-10-CM | POA: Diagnosis not present

## 2024-03-29 DIAGNOSIS — Z982 Presence of cerebrospinal fluid drainage device: Secondary | ICD-10-CM | POA: Diagnosis not present

## 2024-03-29 DIAGNOSIS — Z8261 Family history of arthritis: Secondary | ICD-10-CM

## 2024-03-29 DIAGNOSIS — G20A1 Parkinson's disease without dyskinesia, without mention of fluctuations: Secondary | ICD-10-CM | POA: Diagnosis present

## 2024-03-29 DIAGNOSIS — Z8673 Personal history of transient ischemic attack (TIA), and cerebral infarction without residual deficits: Secondary | ICD-10-CM

## 2024-03-29 DIAGNOSIS — G9389 Other specified disorders of brain: Secondary | ICD-10-CM | POA: Diagnosis not present

## 2024-03-29 HISTORY — PX: VENTRICULOPERITONEAL SHUNT: SHX204

## 2024-03-29 HISTORY — DX: (Idiopathic) normal pressure hydrocephalus: G91.2

## 2024-03-29 LAB — MRSA NEXT GEN BY PCR, NASAL: MRSA by PCR Next Gen: NOT DETECTED

## 2024-03-29 SURGERY — SHUNT INSERTION VENTRICULAR-PERITONEAL
Anesthesia: General | Site: Head | Laterality: Right

## 2024-03-29 MED ORDER — LIDOCAINE 2% (20 MG/ML) 5 ML SYRINGE
INTRAMUSCULAR | Status: DC | PRN
  Administered 2024-03-29: 60 mg via INTRAVENOUS

## 2024-03-29 MED ORDER — ONDANSETRON HCL 4 MG/2ML IJ SOLN
INTRAMUSCULAR | Status: DC | PRN
  Administered 2024-03-29: 4 mg via INTRAVENOUS

## 2024-03-29 MED ORDER — ONDANSETRON HCL 4 MG PO TABS: 4.0000 mg | ORAL_TABLET | ORAL | Status: DC | PRN

## 2024-03-29 MED ORDER — LEVETIRACETAM (KEPPRA) 500 MG/5 ML ADULT IV PUSH
500.0000 mg | Freq: Two times a day (BID) | INTRAVENOUS | Status: DC
Start: 2024-03-29 — End: 2024-03-31
  Administered 2024-03-29 – 2024-03-31 (×4): 500 mg via INTRAVENOUS
  Filled 2024-03-29 (×4): qty 5

## 2024-03-29 MED ORDER — CARBIDOPA-LEVODOPA 25-100 MG PO TABS
2.0000 | ORAL_TABLET | ORAL | Status: DC
  Administered 2024-03-30 – 2024-04-01 (×6): 2 via ORAL
  Filled 2024-03-29 (×6): qty 2

## 2024-03-29 MED ORDER — PHENYLEPHRINE HCL-NACL 20-0.9 MG/250ML-% IV SOLN
INTRAVENOUS | Status: DC | PRN
  Administered 2024-03-29: 30 ug/min via INTRAVENOUS

## 2024-03-29 MED ORDER — CEFAZOLIN SODIUM-DEXTROSE 2-4 GM/100ML-% IV SOLN
2.0000 g | Freq: Three times a day (TID) | INTRAVENOUS | Status: AC
  Administered 2024-03-29 – 2024-03-31 (×6): 2 g via INTRAVENOUS
  Filled 2024-03-29 (×6): qty 100

## 2024-03-29 MED ORDER — CARBIDOPA-LEVODOPA ER 50-200 MG PO TBCR
1.0000 | EXTENDED_RELEASE_TABLET | Freq: Every day | ORAL | Status: DC
  Administered 2024-03-29 – 2024-03-31 (×3): 1 via ORAL
  Filled 2024-03-29 (×4): qty 1

## 2024-03-29 MED ORDER — CEFAZOLIN SODIUM-DEXTROSE 2-4 GM/100ML-% IV SOLN
INTRAVENOUS | Status: AC
  Filled 2024-03-29: qty 100

## 2024-03-29 MED ORDER — MIRABEGRON ER 50 MG PO TB24
50.0000 mg | ORAL_TABLET | Freq: Every day | ORAL | Status: DC
  Administered 2024-03-30 – 2024-04-01 (×3): 50 mg via ORAL
  Filled 2024-03-29 (×3): qty 1

## 2024-03-29 MED ORDER — PROMETHAZINE HCL 25 MG PO TABS: 12.5000 mg | ORAL_TABLET | ORAL | Status: DC | PRN

## 2024-03-29 MED ORDER — ROCURONIUM BROMIDE 10 MG/ML (PF) SYRINGE
PREFILLED_SYRINGE | INTRAVENOUS | Status: AC
  Filled 2024-03-29: qty 10

## 2024-03-29 MED ORDER — LACTATED RINGERS IV SOLN: INTRAVENOUS | Status: DC

## 2024-03-29 MED ORDER — THROMBIN 5000 UNITS EX SOLR
CUTANEOUS | Status: DC | PRN
  Administered 2024-03-29: 5 mL via TOPICAL

## 2024-03-29 MED ORDER — FLUTICASONE FUROATE-VILANTEROL 200-25 MCG/ACT IN AEPB
1.0000 | INHALATION_SPRAY | Freq: Every day | RESPIRATORY_TRACT | Status: DC
  Administered 2024-03-31 – 2024-04-01 (×2): 1 via RESPIRATORY_TRACT
  Filled 2024-03-29: qty 28

## 2024-03-29 MED ORDER — PANTOPRAZOLE SODIUM 40 MG IV SOLR
40.0000 mg | Freq: Every day | INTRAVENOUS | Status: DC
  Administered 2024-03-29 – 2024-03-30 (×2): 40 mg via INTRAVENOUS
  Filled 2024-03-29 (×2): qty 10

## 2024-03-29 MED ORDER — ONDANSETRON HCL 4 MG/2ML IJ SOLN: 4.0000 mg | INTRAMUSCULAR | Status: DC | PRN

## 2024-03-29 MED ORDER — ORAL CARE MOUTH RINSE
15.0000 mL | OROMUCOSAL | Status: DC | PRN
Start: 2024-03-29 — End: 2024-04-01

## 2024-03-29 MED ORDER — CARBIDOPA-LEVODOPA 25-100 MG PO TABS
1.0000 | ORAL_TABLET | ORAL | Status: DC
  Administered 2024-03-30 – 2024-04-01 (×4): 1 via ORAL
  Filled 2024-03-29 (×6): qty 1

## 2024-03-29 MED ORDER — PROPOFOL 10 MG/ML IV BOLUS
INTRAVENOUS | Status: DC | PRN
  Administered 2024-03-29: 100 mg via INTRAVENOUS

## 2024-03-29 MED ORDER — CHLORHEXIDINE GLUCONATE CLOTH 2 % EX PADS: 6.0000 | MEDICATED_PAD | Freq: Once | CUTANEOUS | Status: DC

## 2024-03-29 MED ORDER — SUGAMMADEX SODIUM 200 MG/2ML IV SOLN
INTRAVENOUS | Status: DC | PRN
  Administered 2024-03-29: 50 mg via INTRAVENOUS
  Administered 2024-03-29: 150 mg via INTRAVENOUS

## 2024-03-29 MED ORDER — PROPOFOL 10 MG/ML IV BOLUS
INTRAVENOUS | Status: AC
  Filled 2024-03-29: qty 20

## 2024-03-29 MED ORDER — FENTANYL CITRATE (PF) 250 MCG/5ML IJ SOLN
INTRAMUSCULAR | Status: AC
  Filled 2024-03-29: qty 5

## 2024-03-29 MED ORDER — CEFAZOLIN SODIUM-DEXTROSE 2-4 GM/100ML-% IV SOLN
2.0000 g | INTRAVENOUS | Status: AC
  Administered 2024-03-29: 2 g via INTRAVENOUS

## 2024-03-29 MED ORDER — LIDOCAINE 2% (20 MG/ML) 5 ML SYRINGE
INTRAMUSCULAR | Status: AC
  Filled 2024-03-29: qty 5

## 2024-03-29 MED ORDER — ROCURONIUM BROMIDE 10 MG/ML (PF) SYRINGE
PREFILLED_SYRINGE | INTRAVENOUS | Status: DC | PRN
  Administered 2024-03-29 (×2): 30 mg via INTRAVENOUS

## 2024-03-29 MED ORDER — AMISULPRIDE (ANTIEMETIC) 5 MG/2ML IV SOLN: 10.0000 mg | Freq: Once | INTRAVENOUS | Status: DC | PRN

## 2024-03-29 MED ORDER — CHLORHEXIDINE GLUCONATE CLOTH 2 % EX PADS
6.0000 | MEDICATED_PAD | Freq: Every day | CUTANEOUS | Status: DC
  Administered 2024-03-29 – 2024-04-01 (×4): 6 via TOPICAL

## 2024-03-29 MED ORDER — ACETAMINOPHEN 650 MG RE SUPP: 650.0000 mg | RECTAL | Status: DC | PRN

## 2024-03-29 MED ORDER — EPHEDRINE SULFATE-NACL 50-0.9 MG/10ML-% IV SOSY
PREFILLED_SYRINGE | INTRAVENOUS | Status: DC | PRN
  Administered 2024-03-29: 10 mg via INTRAVENOUS

## 2024-03-29 MED ORDER — HYDROCODONE-ACETAMINOPHEN 5-325 MG PO TABS
1.0000 | ORAL_TABLET | ORAL | Status: DC | PRN
  Filled 2024-03-29: qty 1

## 2024-03-29 MED ORDER — FENTANYL CITRATE (PF) 250 MCG/5ML IJ SOLN
INTRAMUSCULAR | Status: DC | PRN
  Administered 2024-03-29: 100 ug via INTRAVENOUS

## 2024-03-29 MED ORDER — POTASSIUM CHLORIDE IN NACL 20-0.9 MEQ/L-% IV SOLN
INTRAVENOUS | Status: AC
Start: 2024-03-29 — End: 2024-03-30
  Filled 2024-03-29: qty 1000

## 2024-03-29 MED ORDER — LEVALBUTEROL HCL 0.63 MG/3ML IN NEBU: 0.6300 mg | INHALATION_SOLUTION | Freq: Four times a day (QID) | RESPIRATORY_TRACT | Status: DC | PRN

## 2024-03-29 MED ORDER — ACETAMINOPHEN 10 MG/ML IV SOLN: 1000.0000 mg | Freq: Once | INTRAVENOUS | Status: DC | PRN

## 2024-03-29 MED ORDER — SODIUM CHLORIDE 0.9% FLUSH: 3.0000 mL | INTRAVENOUS | Status: DC | PRN

## 2024-03-29 MED ORDER — THROMBIN 5000 UNITS EX SOLR: CUTANEOUS | Status: DC | PRN

## 2024-03-29 MED ORDER — MORPHINE SULFATE (PF) 2 MG/ML IV SOLN: 2.0000 mg | INTRAVENOUS | Status: DC | PRN

## 2024-03-29 MED ORDER — EPHEDRINE 5 MG/ML INJ
INTRAVENOUS | Status: AC
  Filled 2024-03-29: qty 5

## 2024-03-29 MED ORDER — POLYETHYLENE GLYCOL 3350 17 G PO PACK
17.0000 g | PACK | Freq: Every day | ORAL | Status: DC | PRN
  Administered 2024-03-30: 17 g via ORAL
  Filled 2024-03-29: qty 1

## 2024-03-29 MED ORDER — SENNA 8.6 MG PO TABS
1.0000 | ORAL_TABLET | Freq: Every day | ORAL | Status: DC
  Administered 2024-03-30 – 2024-04-01 (×3): 8.6 mg via ORAL
  Filled 2024-03-29 (×3): qty 1

## 2024-03-29 MED ORDER — HYDROCHLOROTHIAZIDE 25 MG PO TABS
25.0000 mg | ORAL_TABLET | Freq: Every day | ORAL | Status: DC
  Administered 2024-03-30 – 2024-04-01 (×3): 25 mg via ORAL
  Filled 2024-03-29 (×3): qty 1

## 2024-03-29 MED ORDER — FESOTERODINE FUMARATE ER 4 MG PO TB24
4.0000 mg | ORAL_TABLET | Freq: Every day | ORAL | Status: DC
  Administered 2024-03-30 – 2024-04-01 (×3): 4 mg via ORAL
  Filled 2024-03-29 (×3): qty 1

## 2024-03-29 MED ORDER — LACTATED RINGERS IV SOLN: INTRAVENOUS | Status: DC | PRN

## 2024-03-29 MED ORDER — ONDANSETRON HCL 4 MG/2ML IJ SOLN: 4.0000 mg | Freq: Once | INTRAMUSCULAR | Status: DC | PRN

## 2024-03-29 MED ORDER — BUPIVACAINE-EPINEPHRINE 0.5% -1:200000 IJ SOLN
INTRAMUSCULAR | Status: DC | PRN
  Administered 2024-03-29: 20 mL

## 2024-03-29 MED ORDER — SODIUM CHLORIDE 0.9% FLUSH
3.0000 mL | Freq: Two times a day (BID) | INTRAVENOUS | Status: DC
  Administered 2024-03-29 – 2024-03-31 (×5): 10 mL via INTRAVENOUS
  Administered 2024-04-01: 3 mL via INTRAVENOUS

## 2024-03-29 MED ORDER — DEXAMETHASONE SODIUM PHOSPHATE 10 MG/ML IJ SOLN
INTRAMUSCULAR | Status: AC
Start: 2024-03-29 — End: 2024-03-29
  Filled 2024-03-29: qty 1

## 2024-03-29 MED ORDER — BUPIVACAINE-EPINEPHRINE (PF) 0.5% -1:200000 IJ SOLN
INTRAMUSCULAR | Status: AC
  Filled 2024-03-29: qty 30

## 2024-03-29 MED ORDER — ACETAMINOPHEN 325 MG PO TABS
650.0000 mg | ORAL_TABLET | ORAL | Status: DC | PRN
  Administered 2024-03-29 – 2024-03-31 (×2): 650 mg via ORAL
  Filled 2024-03-29 (×2): qty 2

## 2024-03-29 MED ORDER — EZETIMIBE 10 MG PO TABS
10.0000 mg | ORAL_TABLET | Freq: Every day | ORAL | Status: DC
  Administered 2024-03-30 – 2024-04-01 (×3): 10 mg via ORAL
  Filled 2024-03-29 (×3): qty 1

## 2024-03-29 MED ORDER — FENTANYL CITRATE (PF) 100 MCG/2ML IJ SOLN: 25.0000 ug | INTRAMUSCULAR | Status: DC | PRN

## 2024-03-29 MED ORDER — BACITRACIN ZINC 500 UNIT/GM EX OINT
TOPICAL_OINTMENT | CUTANEOUS | Status: AC
  Filled 2024-03-29: qty 28.35

## 2024-03-29 MED ORDER — ALBUTEROL SULFATE (2.5 MG/3ML) 0.083% IN NEBU: 3.0000 mL | INHALATION_SOLUTION | Freq: Four times a day (QID) | RESPIRATORY_TRACT | Status: DC | PRN

## 2024-03-29 MED ORDER — THROMBIN 5000 UNITS EX KIT
PACK | CUTANEOUS | Status: AC
  Filled 2024-03-29: qty 1

## 2024-03-29 MED ORDER — ORAL CARE MOUTH RINSE: 15.0000 mL | Freq: Once | OROMUCOSAL | Status: AC

## 2024-03-29 MED ORDER — ONDANSETRON HCL 4 MG/2ML IJ SOLN
INTRAMUSCULAR | Status: AC
  Filled 2024-03-29: qty 2

## 2024-03-29 MED ORDER — LABETALOL HCL 5 MG/ML IV SOLN
10.0000 mg | INTRAVENOUS | Status: DC | PRN
  Administered 2024-03-30: 10 mg via INTRAVENOUS
  Filled 2024-03-29: qty 4

## 2024-03-29 MED ORDER — AMIODARONE HCL 200 MG PO TABS
100.0000 mg | ORAL_TABLET | Freq: Every day | ORAL | Status: DC
  Administered 2024-03-30 – 2024-04-01 (×3): 100 mg via ORAL
  Filled 2024-03-29 (×3): qty 1

## 2024-03-29 MED ORDER — SERTRALINE HCL 50 MG PO TABS
100.0000 mg | ORAL_TABLET | Freq: Every day | ORAL | Status: DC
  Administered 2024-03-30 – 2024-04-01 (×3): 100 mg via ORAL
  Filled 2024-03-29 (×3): qty 2

## 2024-03-29 MED ORDER — CHLORHEXIDINE GLUCONATE 0.12 % MT SOLN
OROMUCOSAL | Status: AC
  Administered 2024-03-29: 15 mL via OROMUCOSAL
  Filled 2024-03-29: qty 15

## 2024-03-29 MED ORDER — PANTOPRAZOLE SODIUM 40 MG PO TBEC: 40.0000 mg | DELAYED_RELEASE_TABLET | Freq: Every day | ORAL | Status: DC

## 2024-03-29 MED ORDER — DIVALPROEX SODIUM 125 MG PO DR TAB
125.0000 mg | DELAYED_RELEASE_TABLET | Freq: Every day | ORAL | Status: DC
  Administered 2024-03-29 – 2024-03-31 (×3): 125 mg via ORAL
  Filled 2024-03-29 (×4): qty 1

## 2024-03-29 MED ORDER — CHLORHEXIDINE GLUCONATE 0.12 % MT SOLN
15.0000 mL | Freq: Once | OROMUCOSAL | Status: AC
Start: 2024-03-29 — End: 2024-03-29

## 2024-03-29 SURGICAL SUPPLY — 53 items
BAG COUNTER SPONGE SURGICOUNT (BAG) ×1 IMPLANT
BENZOIN TINCTURE PRP APPL 2/3 (GAUZE/BANDAGES/DRESSINGS) ×1 IMPLANT
BLADE CLIPPER SURG (BLADE) ×2 IMPLANT
BUR PRECISION FLUTE 6.0 (BURR) ×1 IMPLANT
CANISTER SUCTION 3000ML PPV (SUCTIONS) ×1 IMPLANT
CLAMP SUTURE YELLOW 5 PAIRS (MISCELLANEOUS) ×1 IMPLANT
CLIP RANEY DISP (INSTRUMENTS) IMPLANT
DRAPE INCISE IOBAN 85X60 (DRAPES) ×1 IMPLANT
DRAPE SURG 17X23 STRL (DRAPES) ×6 IMPLANT
DRAPE SURG ORHT 6 SPLT 77X108 (DRAPES) ×2 IMPLANT
DRSG OPSITE POSTOP 3X4 (GAUZE/BANDAGES/DRESSINGS) IMPLANT
DRSG OPSITE POSTOP 4X6 (GAUZE/BANDAGES/DRESSINGS) IMPLANT
ELECTRODE REM PT RTRN 9FT ADLT (ELECTROSURGICAL) ×1 IMPLANT
GAUZE 4X4 16PLY ~~LOC~~+RFID DBL (SPONGE) IMPLANT
GLOVE BIO SURGEON STRL SZ8 (GLOVE) ×1 IMPLANT
GLOVE BIO SURGEON STRL SZ8.5 (GLOVE) ×1 IMPLANT
GLOVE EXAM NITRILE XL STR (GLOVE) IMPLANT
GOWN STRL REUS W/ TWL LRG LVL3 (GOWN DISPOSABLE) IMPLANT
GOWN STRL REUS W/ TWL XL LVL3 (GOWN DISPOSABLE) IMPLANT
HEMOSTAT POWDER KIT SURGIFOAM (HEMOSTASIS) IMPLANT
KIT BASIN OR (CUSTOM PROCEDURE TRAY) ×1 IMPLANT
KIT TURNOVER KIT B (KITS) ×1 IMPLANT
NDL HYPO 22X1.5 SAFETY MO (MISCELLANEOUS) ×1 IMPLANT
NEEDLE HYPO 22X1.5 SAFETY MO (MISCELLANEOUS) ×1 IMPLANT
NS IRRIG 1000ML POUR BTL (IV SOLUTION) ×1 IMPLANT
PACK LAMINECTOMY NEURO (CUSTOM PROCEDURE TRAY) ×1 IMPLANT
PAD ARMBOARD POSITIONER FOAM (MISCELLANEOUS) ×3 IMPLANT
PATTIES SURGICAL .5 X.5 (GAUZE/BANDAGES/DRESSINGS) ×1 IMPLANT
PATTIES SURGICAL 1X1 (DISPOSABLE) ×1 IMPLANT
SHEATH PERITONEAL INTRO 46 (SHEATH) IMPLANT
SHEATH PERITONEAL INTRO 61 (SHEATH) IMPLANT
SPONGE SURGIFOAM ABS GEL SZ50 (HEMOSTASIS) ×1 IMPLANT
SPONGE T-LAP 4X18 ~~LOC~~+RFID (SPONGE) IMPLANT
STAPLER SKIN PROX 35W (STAPLE) ×1 IMPLANT
STAPLER SKIN PROX WIDE 3.9 (STAPLE) IMPLANT
STRIP CLOSURE SKIN 1/2X4 (GAUZE/BANDAGES/DRESSINGS) ×1 IMPLANT
SUT ETHILON 3 0 PS 1 (SUTURE) IMPLANT
SUT NURALON 4 0 TR CR/8 (SUTURE) IMPLANT
SUT SILK 0 100YDS SPOOL (SUTURE) IMPLANT
SUT SILK 2 0 TIES 10X30 (SUTURE) IMPLANT
SUT SILK 3 0 REEL (SUTURE) IMPLANT
SUT SILK 3 0 SH 30 (SUTURE) ×1 IMPLANT
SUT VIC AB 1 CT1 18XBRD ANBCTR (SUTURE) IMPLANT
SUT VIC AB 2-0 CP2 18 (SUTURE) ×1 IMPLANT
SUT VIC AB 3-0 SH 8-18 (SUTURE) ×1 IMPLANT
SYR 30ML SLIP (SYRINGE) ×1 IMPLANT
SYR CONTROL 10ML LL (SYRINGE) ×1 IMPLANT
TOWEL GREEN STERILE (TOWEL DISPOSABLE) ×1 IMPLANT
TOWEL GREEN STERILE FF (TOWEL DISPOSABLE) ×1 IMPLANT
TRAY FOLEY MTR SLVR 16FR STAT (SET/KITS/TRAYS/PACK) IMPLANT
UNDERPAD 30X36 HEAVY ABSORB (UNDERPADS AND DIAPERS) ×1 IMPLANT
VALVE CERTAS PLUS SYSTEM (Valve) IMPLANT
WATER STERILE IRR 1000ML POUR (IV SOLUTION) ×1 IMPLANT

## 2024-03-29 NOTE — Anesthesia Procedure Notes (Signed)
 Procedure Name: Intubation Date/Time: 03/29/2024 3:24 PM  Performed by: Elby Raelene SAUNDERS, CRNAPre-anesthesia Checklist: Patient identified, Emergency Drugs available, Suction available and Patient being monitored Patient Re-evaluated:Patient Re-evaluated prior to induction Oxygen  Delivery Method: Circle System Utilized Preoxygenation: Pre-oxygenation with 100% oxygen  Induction Type: IV induction Ventilation: Mask ventilation without difficulty Laryngoscope Size: Miller and 3 Grade View: Grade I Tube type: Oral Tube size: 7.5 mm Number of attempts: 1 Airway Equipment and Method: Stylet and Oral airway Placement Confirmation: ETT inserted through vocal cords under direct vision, positive ETCO2 and breath sounds checked- equal and bilateral Secured at: 22 cm Tube secured with: Tape Dental Injury: Teeth and Oropharynx as per pre-operative assessment

## 2024-03-29 NOTE — H&P (Signed)
 Subjective: The patient is an 83 year old right-handed white male who has complained of unsteady gait, incontinence and some memory difficulties.  He was worked up with a brain MRI which demonstrated ventriculomegaly.  I discussed the possibility of normal pressure hydrocephalus with the patient and his family.  We discussed the various treatment options.  He has decided with placement of a ventriculoperitoneal shunt.  Past Medical History:  Diagnosis Date   A-fib [facility])    Allergy    Anemia    mild   Arthritis 04/06/2017   Asthma    childhood   ASVD (arteriosclerotic vascular disease) 09/07/2018   Back pain with radiation 04/26/2013   Low back with LLE radiculopathy   Basal cell carcinoma    skin- on nose- basal cell (20 yrs ago) forehead 1 year ago     Sees Dr Darice Reusing of dermatology   BPH (benign prostatic hyperplasia)    Bradycardia 06/28/2018   Cataract 10/07/2014   Cerumen impaction 08/10/2012   Chicken pox as child   Coronary artery calcification 11/23/2019   Dysphagia 09/11/2021   Elevated LFTs 11/2017   suspected drug/antibiotic induced   Essential hypertension    ETD (Eustachian tube dysfunction), right 01/17/2022   Generalized anxiety disorder    GERD (gastroesophageal reflux disease)    [place] measles as a child   Hemoptysis 01/17/2022   Hyperglycemia 04/25/2020   Hyperlipidemia    Insomnia 02/06/2021   Internal hemorrhoids    Irregular cardiac rhythm 03/10/2018   Lipoma 08/04/2018   Macular degeneration    Major depressive disorder 08/01/2012   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   Nocturia 12/04/2022   Obstructive sleep apnea 11/09/2018   Patient reports mild symptoms; was not prescribed a CPAP machine   Otitis externa 08/10/2012   PAF (paroxysmal atrial fibrillation)    Palpitations 09/07/2018   Parkinson's disease (HCC) 05/19/2019   Recurrent falls 12/04/2022   Thrombocytopenia    TIA (transient ischemic attack)  03/10/2018   Unsteady gait 09/11/2021    Past Surgical History:  Procedure Laterality Date   BELPHAROPTOSIS REPAIR     very young, b/l   EXCISIONAL HEMORRHOIDECTOMY     EYE SURGERY  2017   eyelids   HYDROCELE EXCISION / REPAIR  2012   b/l   SKIN CANCER EXCISION     nose and forehead, basal cell CA   UPPER GASTROINTESTINAL ENDOSCOPY      Allergies  Allergen Reactions   Albuterol  Sulfate Palpitations   Azithromycin Other (See Comments)    Hepatotoxicity   Doxycycline Hyclate Other (See Comments)    Taken with Azithromycin and had Heaptotoxicity    Social History   Tobacco Use   Smoking status: Former    Current packs/day: 0.00    Average packs/day: 1.5 packs/day for 15.0 years (22.5 ttl pk-yrs)    Types: Cigarettes    Start date: 10/06/1978    Quit date: 10/07/1978    Years since quitting: 45.5   Smokeless tobacco: Never   Tobacco comments:    QUIT 1980  Substance Use Topics   Alcohol use: Yes    Alcohol/week: 7.0 standard drinks of alcohol    Types: 7 Glasses of wine per week    Comment: wine with meals    Family History  Problem Relation Age of Onset   Hypertension Mother    Other Mother        MRSA   Heart disease Mother  stents   ADD / ADHD Mother    Arthritis Mother    Asthma Mother    Depression Mother    Vision loss Mother    Heart disease Father    Hypertension Father    Prostate cancer Father 60   Parkinson's disease Father    Cancer Father 35   Hearing loss Father    Stroke Father    Vision loss Father    Varicose Veins Father    Stroke Maternal Grandmother    Prostate cancer Maternal Grandfather    Asthma Maternal Grandfather    Cancer Maternal Grandfather    Cancer Paternal Grandfather        EYE   Hearing loss Paternal Grandfather    ADD / ADHD Son        ADHD   Other Son 24       part of 1 lung removed- due to infection   Alcohol abuse Son    Depression Son    Alcohol abuse Son    Alcohol abuse Maternal Uncle    Depression  Maternal Uncle    Asthma Maternal Uncle    Heart disease Paternal Uncle    Colon cancer Neg Hx    Esophageal cancer Neg Hx    Rectal cancer Neg Hx    Stomach cancer Neg Hx    Prior to Admission medications   Medication Sig Start Date End Date Taking? Authorizing Provider  amiodarone  (PACERONE ) 200 MG tablet TAKE 1/2 TABLET BY MOUTH DAILY 10/02/23  Yes Court Dorn PARAS, MD  benzonatate  (TESSALON ) 100 MG capsule Take 1-2 capsules (100-200 mg total) by mouth 2 (two) times daily as needed for cough. 03/08/24  Yes Domenica Harlene LABOR, MD  budesonide -formoterol  (SYMBICORT ) 160-4.5 MCG/ACT inhaler Inhale 2 puffs into the lungs 2 (two) times daily. 12/05/23  Yes Amon Aloysius BRAVO, MD  carbidopa -levodopa  (SINEMET  CR) 50-200 MG tablet TAKE 1 TABLET BY MOUTH EVERYDAY AT BEDTIME 11/27/23  Yes Tat, Asberry RAMAN, DO  carbidopa -levodopa  (SINEMET  IR) 25-100 MG tablet 2 AT 7AM, 2 AT 11AM, Take  1 AT 3PM, Take 1 at 7 PM 01/28/24  Yes Tat, Asberry RAMAN, DO  cholecalciferol (VITAMIN D3) 25 MCG (1000 UNIT) tablet Take 2,000 Units by mouth daily.   Yes [provider]  divalproex  (DEPAKOTE ) 125 MG DR tablet TAKE 1 TABLET (125 MG TOTAL) BY MOUTH AT BEDTIME 10/29/23  Yes Wertman, Sara E, PA-C  ezetimibe  (ZETIA ) 10 MG tablet TAKE 1 TABLET BY MOUTH EVERY DAY 02/27/23  Yes Walker, Caitlin S, NP  hydrochlorothiazide  (HYDRODIURIL ) 25 MG tablet TAKE 1 TABLET BY MOUTH EVERY DAY 10/02/23  Yes Domenica Harlene LABOR, MD  levalbuterol  (XOPENEX  HFA) 45 MCG/ACT inhaler INHALE 1 TO 2 PUFFS BY MOUTH EVERY 6 HOURS AS NEEDED FOR WHEEZE 12/16/23  Yes Domenica Harlene LABOR, MD  Melatonin 10 MG TABS Take 10 mg by mouth daily as needed.   Yes [provider]  Multiple Vitamins-Minerals (ICAPS AREDS 2 PO) Take 1 capsule by mouth in the morning and at bedtime.   Yes [provider]  senna (SENOKOT) 8.6 MG TABS tablet Take 1 tablet by mouth in the morning and at bedtime.   Yes [provider]  sertraline  (ZOLOFT ) 100 MG tablet TAKE 1  TABLET BY MOUTH EVERY DAY 10/02/23  Yes Domenica Harlene LABOR, MD  solifenacin (VESICARE) 5 MG tablet Take 5 mg by mouth daily. 11/06/23  Yes [provider]  acetaminophen  (TYLENOL ) 500 MG tablet Take 2 tablets (1,000 mg  total) by mouth every 6 (six) hours as needed for moderate pain (pain score 4-6). 08/13/23   Nivia Colon, PA-C  guaifenesin  (ROBITUSSIN) 100 MG/5ML syrup Take 10 mLs (200 mg total) by mouth 3 (three) times daily as needed for cough. 12/05/23   Amon Aloysius BRAVO, MD  mirabegron ER (MYRBETRIQ) 50 MG TB24 tablet Take 50 mg by mouth daily.    [provider]  pantoprazole  (PROTONIX ) 40 MG tablet Take 1 tablet (40 mg total) by mouth daily as needed. 07/14/23   O'Sullivan, Melissa, NP  Vitamin D , Ergocalciferol , (DRISDOL ) 1.25 MG (50000 UNIT) CAPS capsule Take 1 capsule (50,000 Units total) by mouth every 7 (seven) days. 03/09/24   Domenica Harlene LABOR, MD     Review of Systems  Positive ROS: As above  All other systems have been reviewed and were otherwise negative with the exception of those mentioned in the HPI and as above.  Objective: Vital signs in last 24 hours: Temp:  [97.8 F (36.6 C)] 97.8 F (36.6 C) (06/23 1233) Pulse Rate:  [56] 56 (06/23 1233) Resp:  [17] 17 (06/23 1233) BP: (167)/(86) 167/86 (06/23 1233) SpO2:  [96 %] 96 % (06/23 1233) Weight:  [72.6 kg] 72.6 kg (06/23 1233) Estimated body mass index is 26.63 kg/m as calculated from the following:   Height as of this encounter: 5' 5 (1.651 m).   Weight as of this encounter: 72.6 kg.   General Appearance: Alert Head: Normocephalic, without obvious abnormality, atraumatic Eyes: PERRL, conjunctiva/corneas clear, EOM's intact,    Ears: Normal  Throat: Normal  Neck: Supple, Back: unremarkable Lungs: Clear to auscultation bilaterally, respirations unlabored Heart: Regular rate and rhythm, no murmur, rub or gallop Abdomen: Soft, non-tender Extremities: Extremities normal, atraumatic, no cyanosis or  edema Skin: unremarkable  NEUROLOGIC:   Mental status: alert and oriented,Motor Exam - grossly normal Sensory Exam - grossly normal Reflexes:  Coordination - grossly normal Gait -unsteady Balance - grossly normal Cranial Nerves: I: smell Not tested  II: visual acuity  OS: Normal  OD: Normal   II: visual fields Full to confrontation  II: pupils Equal, round, reactive to light  III,VII: ptosis None  III,IV,VI: extraocular muscles  Full ROM  V: mastication Normal  V: facial light touch sensation  Normal  V,VII: corneal reflex  Present  VII: facial muscle function - upper  Normal  VII: facial muscle function - lower Normal  VIII: hearing Not tested  IX: soft palate elevation  Normal  IX,X: gag reflex Present  XI: trapezius strength  5/5  XI: sternocleidomastoid strength 5/5  XI: neck flexion strength  5/5  XII: tongue strength  Normal    Data Review Lab Results  Component Value Date   WBC 8.1 03/08/2024   HGB 13.5 03/08/2024   HCT 39.9 03/08/2024   MCV 94.1 03/08/2024   PLT 224.0 03/08/2024   Lab Results  Component Value Date   NA 141 03/08/2024   K 3.6 03/08/2024   CL 102 03/08/2024   CO2 30 03/08/2024   BUN 12 03/08/2024   CREATININE 0.91 03/08/2024   GLUCOSE 96 03/08/2024   Lab Results  Component Value Date   INR 1.1 03/01/2024    Assessment/Plan: Normal pressure hydrocephalus: I discussed situation with the patient and his family.  We discussed the various treatment options including placement of a ventriculoperitoneal shunt.  I described the surgery to him.  We have discussed the risk, benefits, alternatives, expected postoperative course, and likelihood of achieving our goals with  surgery.  I have answered all their questions.  He has decided proceed with surgery.   Reyes JONETTA Budge 03/29/2024 2:18 PM

## 2024-03-29 NOTE — Transfer of Care (Signed)
 Immediate Anesthesia Transfer of Care Note  Patient: Walter Brown  Procedure(s) Performed: SHUNT INSERTION VENTRICULAR-PERITONEAL (Right: Head)  Patient Location: PACU  Anesthesia Type:General  Level of Consciousness: sedated  Airway & Oxygen  Therapy: Patient Spontanous Breathing and Patient connected to nasal cannula oxygen   Post-op Assessment: Report given to RN and Post -op Vital signs reviewed and stable  Post vital signs: Reviewed and stable  Last Vitals:  Vitals Value Taken Time  BP 140/98 03/29/24 17:10  Temp    Pulse 99 03/29/24 17:13  Resp 11 03/29/24 17:13  SpO2 98 % 03/29/24 17:13  Vitals shown include unfiled device data.  Last Pain:  Vitals:   03/29/24 1338  TempSrc:   PainSc: 0-No pain         Complications: No notable events documented.

## 2024-03-29 NOTE — Consult Note (Signed)
 NAME:  Walter Brown, MRN:  [id number], DOB:  [birth year], LOS: 0 ADMISSION DATE:  03/29/2024, CONSULTATION DATE:  03/29/24 REFERRING MD:  Mavis, CHIEF COMPLAINT:  s/p VPS   History of Present Illness:  83 yo M with Afib not on AC, Parkinsons, NPH presented for VPS 6/23. Case unremarkable. There were no progressive beds post operatively so is admitted to the ICU  PCCM is called in this setting   Pertinent  Medical History  NPH  Parkinsons Afib HTN HLD GERD  Significant Hospital Events: Including procedures, antibiotic start and stop dates in addition to other pertinent events   VPS  Interim History / Subjective:  POD 0 VPS  Objective    Blood pressure (!) 151/110, pulse (!) 118, temperature 98 F (36.7 C), temperature source Oral, resp. rate 18, height 5' 5 (1.651 m), weight 72.6 kg, SpO2 94%.        Intake/Output Summary (Last 24 hours) at 03/29/2024 1809 Last data filed at 03/29/2024 1629 Gross per 24 hour  Intake 400 ml  Output 15 ml  Net 385 ml   Filed Weights   03/29/24 1233  Weight: 72.6 kg    Examination: General: chronically ill well nourished elderly M  HENT: R surgical site is dressed  Lungs: even unlabored  Cardiovascular: irir  Abdomen: soft. Surgical site is dressed  Extremities: no obvious joint deformity Neuro: Awake, following commands GU: defer   Resolved problem list   Assessment and Plan   NPH s/p VPS (03/29/24) P -post op per NSGY -PT/OT -neuro monitoring -PRN analgesia  -adv diet as tolerated -keppra  Afib HTN HLD P -resume home meds when taking POs  -not on eliquis  w fq falls   Hx constipation [facility] and PRN bowel reg -mobility   BPH -home meds when taking POs   Parkinsons , memory disturbance  -depakote  and carbidopa /levadopa (home meds)  -delirium precautions   GERD -PPI   Best Practice (right click and Reselect all SmartList Selections daily)   Diet/type: adv as tolerated  DVT prophylaxis SCD Pressure  ulcer(s): pressure ulcer assessment deferred  GI prophylaxis: PPI Lines: N/A Foley:  N/A Code Status:  full code Last date of multidisciplinary goals of care discussion [--]  Labs   CBC: No results for input(s): WBC, NEUTROABS, HGB, HCT, MCV, PLT in the last 168 hours.  Basic Metabolic Panel: No results for input(s): NA, K, CL, CO2, GLUCOSE, BUN, CREATININE, CALCIUM , MG, PHOS in the last 168 hours. GFR: CrCl cannot be calculated (Patient's most recent lab result is older than the maximum 21 days allowed.). No results for input(s): PROCALCITON, WBC, LATICACIDVEN in the last 168 hours.  Liver Function Tests: No results for input(s): AST, ALT, ALKPHOS, BILITOT, PROT, ALBUMIN in the last 168 hours. No results for input(s): LIPASE, AMYLASE in the last 168 hours. No results for input(s): AMMONIA in the last 168 hours.  ABG No results found for: PHART, PCO2ART, PO2ART, HCO3, TCO2, ACIDBASEDEF, O2SAT   Coagulation Profile: No results for input(s): INR, PROTIME in the last 168 hours.  Cardiac Enzymes: No results for input(s): CKTOTAL, CKMB, CKMBINDEX, TROPONINI in the last 168 hours.  HbA1C: Hgb A1c MFr Bld  Date/Time Value Ref Range Status  02/04/2024 11:22 AM 5.2 4.6 - 6.5 % Final    Comment:    Glycemic Control Guidelines for People with Diabetes:Non Diabetic:  <6%Goal of Therapy: <7%Additional Action Suggested:  >8%   10/30/2023 12:00 PM 5.4 4.6 - 6.5 % Final    Comment:  Glycemic Control Guidelines for People with Diabetes:Non Diabetic:  <6%Goal of Therapy: <7%Additional Action Suggested:  >8%     CBG: No results for input(s): GLUCAP in the last 168 hours.  Review of Systems:   + recent constipation + mild HA   Past Medical History:  He,  has a past medical history of A-fib (HCC), Allergy, Anemia, Arthritis (04/06/2017), Asthma, ASVD (arteriosclerotic vascular disease) (09/07/2018),  Back pain with radiation (04/26/2013), Basal cell carcinoma, BPH (benign prostatic hyperplasia), Bradycardia (06/28/2018), Cataract (10/07/2014), Cerumen impaction (08/10/2012), Chicken pox (as child), Coronary artery calcification (11/23/2019), Dysphagia (09/11/2021), Elevated LFTs (11/2017), Essential hypertension, ETD (Eustachian tube dysfunction), right (01/17/2022), Generalized anxiety disorder, GERD (gastroesophageal reflux disease), [place] measles (as a child), Hemoptysis (01/17/2022), Hyperglycemia (04/25/2020), Hyperlipidemia, Insomnia (02/06/2021), Internal hemorrhoids, Irregular cardiac rhythm (03/10/2018), Lipoma (08/04/2018), Macular degeneration, Major depressive disorder (08/01/2012), 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   Surgical History:   Past Surgical History:  Procedure Laterality Date   BELPHAROPTOSIS REPAIR     very young, b/l   EXCISIONAL HEMORRHOIDECTOMY     EYE SURGERY  2017   eyelids   HYDROCELE EXCISION / REPAIR  2012   b/l   SKIN CANCER EXCISION     nose and forehead, basal cell CA   UPPER GASTROINTESTINAL ENDOSCOPY       Social History:   reports that he quit smoking about 45 years ago. His smoking use included cigarettes. He started smoking about 45 years ago. He has a 22.5 pack-year smoking history. He has never used smokeless tobacco. He reports current alcohol use of about 7.0 standard drinks of alcohol per week. He reports that he does not currently use drugs.   Family History:  His family history includes ADD / ADHD in his mother and son; Alcohol abuse in his maternal uncle, son, and son; Arthritis in his  mother; Asthma in his maternal grandfather, maternal uncle, and mother; Cancer in his maternal grandfather and paternal grandfather; Cancer (age of onset: 100) in his father; Depression in his maternal uncle, mother, and son; Hearing loss in his father and paternal grandfather; Heart disease in his father, mother, and paternal uncle; Hypertension in his father and mother; Other in his mother; Other (age of onset: 29) in his son; Parkinson's disease in his father; Prostate cancer in his maternal grandfather; Prostate cancer (age of onset: 57) in his father; Stroke in his father and maternal grandmother; Varicose Veins in his father; Vision loss in his father and mother. There is no history of Colon cancer, Esophageal cancer, Rectal cancer, or Stomach cancer.   Allergies Allergies  Allergen Reactions   Albuterol  Sulfate Palpitations   Azithromycin Other (See Comments)    Hepatotoxicity   Doxycycline Hyclate Other (See Comments)    Taken with Azithromycin and had Heaptotoxicity     Home Medications  Prior to Admission medications   Medication Sig Start Date End Date Taking? Authorizing Provider  amiodarone  (PACERONE ) 200 MG tablet TAKE 1/2 TABLET BY MOUTH DAILY 10/02/23  Yes Court Dorn PARAS, MD  benzonatate  (TESSALON ) 100 MG capsule Take 1-2 capsules (100-200 mg total) by mouth 2 (two) times daily as needed for cough. 03/08/24  Yes Domenica Harlene LABOR, MD  budesonide -formoterol  (SYMBICORT ) 160-4.5 MCG/ACT inhaler Inhale 2 puffs into the lungs 2 (two) times daily. 12/05/23  Yes Paz, Jose E, MD  carbidopa -levodopa  (SINEMET  CR) 50-200 MG tablet TAKE 1 TABLET BY MOUTH EVERYDAY AT BEDTIME 11/27/23  Yes Tat, Asberry RAMAN, DO  carbidopa -levodopa  (SINEMET  IR) 25-100 MG tablet 2 AT 7AM, 2 AT 11AM, Take  1 AT 3PM, Take 1 at 7 PM 01/28/24  Yes Tat, Asberry RAMAN, DO  cholecalciferol (VITAMIN D3) 25 MCG (1000 UNIT) tablet Take 2,000 Units by mouth daily.   Yes [provider]  divalproex  (DEPAKOTE ) 125 MG DR tablet  TAKE 1 TABLET (125 MG TOTAL) BY MOUTH AT BEDTIME 10/29/23  Yes Wertman, Sara E, PA-C  ezetimibe  (ZETIA ) 10 MG tablet TAKE 1 TABLET BY MOUTH EVERY DAY 02/27/23  Yes Walker, Caitlin S, NP  hydrochlorothiazide  (HYDRODIURIL ) 25 MG tablet TAKE 1 TABLET BY MOUTH EVERY DAY 10/02/23  Yes Domenica Harlene LABOR, MD  levalbuterol  (XOPENEX  HFA) 45 MCG/ACT inhaler INHALE 1 TO 2 PUFFS BY MOUTH EVERY 6 HOURS AS NEEDED FOR WHEEZE 12/16/23  Yes Domenica Harlene LABOR, MD  Melatonin 10 MG TABS Take 10 mg by mouth daily as needed.   Yes [provider]  Multiple Vitamins-Minerals (ICAPS AREDS 2 PO) Take 1 capsule by mouth in the morning and at bedtime.   Yes [provider]  senna (SENOKOT) 8.6 MG TABS tablet Take 1 tablet by mouth in the morning and at bedtime.   Yes [provider]  sertraline  (ZOLOFT ) 100 MG tablet TAKE 1 TABLET BY MOUTH EVERY DAY 10/02/23  Yes Domenica Harlene LABOR, MD  solifenacin (VESICARE) 5 MG tablet Take 5 mg by mouth daily. 11/06/23  Yes [provider]  acetaminophen  (TYLENOL ) 500 MG tablet Take 2 tablets (1,000 mg total) by mouth every 6 (six) hours as needed for moderate pain (pain score 4-6). 08/13/23   Nivia Colon, PA-C  guaifenesin  (ROBITUSSIN) 100 MG/5ML syrup Take 10 mLs (200 mg total) by mouth 3 (three) times daily as needed for cough. 12/05/23   Amon Aloysius BRAVO, MD  mirabegron ER (MYRBETRIQ) 50 MG TB24 tablet Take 50 mg by mouth daily.    [provider]  pantoprazole  (PROTONIX ) 40 MG tablet Take 1 tablet (40 mg total) by mouth daily as needed. 07/14/23   O'Sullivan, Melissa, NP  Vitamin D , Ergocalciferol , (DRISDOL ) 1.25 MG (50000 UNIT) CAPS capsule Take 1 capsule (50,000 Units total) by mouth every 7 (seven) days. 03/09/24   Domenica Harlene LABOR, MD     Critical care time: na     Ronnald Gave MSN, AGACNP-BC [facility] Pulmonary/Critical Care Medicine Amion for pager 03/29/2024, 6:34 PM

## 2024-03-29 NOTE — Progress Notes (Signed)
 eLink Physician-Brief Progress Note Patient Name: Walter Brown DOB: December 14, 1940 MRN: [id number]   Date of Service  03/29/2024  HPI/Events of Note  Normal pressure hydrocephalus status post VP shunt placement, admitted to ICU for postoperative care  Requesting a diet, passed bedside swallow  eICU Interventions  Advance diet as tolerated, regular diet      Intervention Category Minor Interventions: Routine modifications to care plan (e.g. PRN medications for pain, fever)  Remijio Holleran 03/29/2024, 8:39 PM

## 2024-03-29 NOTE — Op Note (Signed)
 Brief history: The patient is an 83 year old right-handed white male has complained of gait ataxia, incontinence and some memory difficulties consistent with normal pressure hydrocephalus.  He was worked up with a brain MRI which demonstrated ventriculomegaly.  I discussed the diagnosis and possible treatments including surgery.  He has decided with placement of a ventriculoperitoneal shunt.  Preop diagnosis: Normal pressure hydrocephalus  Postop diagnosis: The same  Procedure: Placement of right retroauricular ventriculoperitoneal shunt (Certas 2 programmable shunt set at 4)  Surgeon: Dr. Chyrl Budge  Assistant: None  Anesthesia: General Tracheal  Estimated blood loss: 75 cc  Specimens: None  Complications: None  Description of procedure: The patient was brought to the operating room by the anesthesia team.  General endotracheal anesthesia was induced.  The patient remained in the supine position.  A roll was placed on her right flank.  His head was turned to the left exposing his right retroauricular region.  This region his neck thorax and abdomen were then prepared with Betadine scrub and Betadine solution.  Sterile drapes were applied.  I then injected the area to be incised with Marcaine with epinephrine solution.  I used a scalpel to make an incision in the patient's right upper quadrant of his abdomen.  I dissected down to the anterior rectus sheath and divided it with electrocautery.  I dissected through the rectus muscle and identified the posterior rectus sheath and divided it with the Metzenbaum scissors.  I then identified the underlying peritoneum which I incised with the Metzenbaum scissors during the peritoneal cavity.  I used the [employer] to identify the patient's abdominal structures and liver.  I then made a second incision in the patient's right retroauricular region.  I used the cerebellar retractor for exposure.  I then used the shunt passer to run the peritoneal  catheter from the scalp incision down to the abdominal incision.  I connected the catheter to the valve which we flushed out with saline.  I then used a high-speed drill to create a right retroauricular bur hole.  I coagulated the underlying dura with electrocautery.  I then cannulated the patient's ventricular system with 1 pass with the ventricular needle at approximately 5 cm.  I threaded the ventricular catheter bit deeper without the stylette.  There was free flow of spinal fluid under quite low pressure.  I then connected the ventricular catheter to the valve and secured the ventricular catheter and peritoneal catheter connections with silk ties.  I confirmed free flow of spinal fluid through the distal end of the peritoneal catheter.  I then placed the distal end of the peritoneal catheter into the peritoneum.  I then removed the retractors and then reapproximated the patient's galea with interrupted 2-0 Vicryl suture.  I reapproximated the patient's anterior rectus sheath with a rapid 2-0 Vicryl suture.  I reapproximated his subcutaneous tissue with interrupted 2-0 Vicryl suture.  I then reapproximated the patient's scalp and abdominal skin with stainless steel staples.  The wound was then coated with bacitracin  ointment.  A sterile dressing was applied.  The drapes were removed.  By report all sponge, instrument, and needle counts were correct at the end of this case.

## 2024-03-30 ENCOUNTER — Inpatient Hospital Stay (HOSPITAL_COMMUNITY)

## 2024-03-30 ENCOUNTER — Encounter (HOSPITAL_COMMUNITY): Payer: Self-pay | Admitting: Neurosurgery

## 2024-03-30 ENCOUNTER — Institutional Professional Consult (permissible substitution): Payer: PPO | Admitting: Psychology

## 2024-03-30 ENCOUNTER — Ambulatory Visit: Payer: Self-pay

## 2024-03-30 DIAGNOSIS — G912 (Idiopathic) normal pressure hydrocephalus: Secondary | ICD-10-CM | POA: Diagnosis not present

## 2024-03-30 DIAGNOSIS — I10 Essential (primary) hypertension: Secondary | ICD-10-CM | POA: Diagnosis not present

## 2024-03-30 DIAGNOSIS — E785 Hyperlipidemia, unspecified: Secondary | ICD-10-CM | POA: Diagnosis not present

## 2024-03-30 DIAGNOSIS — N400 Enlarged prostate without lower urinary tract symptoms: Secondary | ICD-10-CM

## 2024-03-30 DIAGNOSIS — I4891 Unspecified atrial fibrillation: Secondary | ICD-10-CM | POA: Diagnosis not present

## 2024-03-30 MED ORDER — POTASSIUM CHLORIDE IN NACL 20-0.9 MEQ/L-% IV SOLN
INTRAVENOUS | Status: AC
  Filled 2024-03-30 (×2): qty 1000

## 2024-03-30 MED ORDER — RAMELTEON 8 MG PO TABS
8.0000 mg | ORAL_TABLET | Freq: Every day | ORAL | Status: DC
  Administered 2024-03-30 – 2024-03-31 (×2): 8 mg via ORAL
  Filled 2024-03-30 (×3): qty 1

## 2024-03-30 NOTE — Progress Notes (Signed)
 I discussed the results of the patient's  postoperative head CT with the patient's wife.  We discussed the various treatment options.  I suggested that we proceed with revision of his ventricular catheter to a better position to decrease the chance of shunt malfunction and decrease the chance that the shunt will benefit the patient.  We discussed the procedure.  I have answered all her questions.   We are going to proceed with a shunt revision tomorrow.

## 2024-03-30 NOTE — Evaluation (Signed)
 Physical Therapy Evaluation Patient Details Name: Walter Brown MRN: [id number] DOB: [birth year] Today's Date: 03/30/2024  History of Present Illness  Patient is an 83 y/o male admitted 03/29/24 due to symptoms of NPH with MRI suggestive of ventriculomegaly and he underwent VP shunt placement.  PMH positive for Parkinson's disease, normal pressure hydrocephalus, recurrent falls, paroxysmal atrial fibrillation, GERD, macular degeneration, OSA, and recent admission 02/2024 due to UTI.  Clinical Impression  Patient presents with decreased mobility due to generalized weakness and decreased activity tolerance post procedure.  RN reports to return to OR tomorrow for optimization of shunt placement.  Patient able to transfer to stand from chair with min A using rail and standing balance with CGA during BP measurements.  Patient previously transferred to scooter using rail and pole in his bedroom and beside the lift chair in the family room at home.  Feel he will benefit from skilled PT in the acute setting to progress mobility as tolerated and for d/c home with continued HHPT which was already in place prior to admission.         If plan is discharge home, recommend the following: A little help with walking and/or transfers;A little help with bathing/dressing/bathroom;Supervision due to cognitive status   Can travel by private vehicle        Equipment Recommendations None recommended by PT  Recommendations for Other Services       Functional Status Assessment Patient has had a recent decline in their functional status and demonstrates the ability to make significant improvements in function in a reasonable and predictable amount of time.     Precautions / Restrictions Precautions Precautions: Fall Recall of Precautions/Restrictions: Intact Precaution/Restrictions Comments: reports one recent fall in bathroom at home      Mobility  Bed Mobility               General bed mobility  comments: in recliner    Transfers Overall transfer level: Needs assistance Equipment used:  (bed rail in front of pt) Transfers: Sit to/from Stand Sit to Stand: Min assist           General transfer comment: performed x 2 for practice and BP check, assist for positioning in front of bed rail, at home uses pole and bed rail for transition to his electric scooter    Ambulation/Gait               General Gait Details: non-ambulatory at baseline  Stairs            Wheelchair Mobility     Tilt Bed    Modified Rankin (Stroke Patients Only)       Balance Overall balance assessment: Needs assistance   Sitting balance-Leahy Scale: Fair Sitting balance - Comments: on edge of chair with S no UE support   Standing balance support: Single extremity supported Standing balance-Leahy Scale: Poor Standing balance comment: CGA in standing with one UE support for BP Check                             Pertinent Vitals/Pain Pain Assessment Pain Assessment: No/denies pain    Home Living Family/patient expects to be discharged to:: Private residence Living Arrangements: Spouse/significant other Available Help at Discharge: Personal care attendant Type of Home: House Home Access: Ramped entrance       Home Layout: One level Home Equipment: Shower seat;Electric scooter;Grab bars - tub/shower;Grab bars - toilet;Hand held shower head;Lift chair (adjustable  bed) Additional Comments: aide 7-8 hours 5 days a week    Prior Function Prior Level of Function : Needs assist             Mobility Comments: does transfers to scooter usually on his own, one fall in bathroom in past 6 months; had therapists from [place]; has rails on his bed and pole beside the bed to get to scooter ADLs Comments: has help sometimes to shower     Extremity/Trunk Assessment   Upper Extremity Assessment Upper Extremity Assessment: Generalized weakness    Lower Extremity  Assessment Lower Extremity Assessment: Generalized weakness    Cervical / Trunk Assessment Cervical / Trunk Assessment: Kyphotic  Communication   Communication Communication: Impaired Factors Affecting Communication: Hearing impaired    Cognition Arousal: Alert Behavior During Therapy: WFL for tasks assessed/performed   PT - Cognitive impairments: History of cognitive impairments, Problem solving                       PT - Cognition Comments: slow processing Following commands: Impaired Following commands impaired: Only follows one step commands consistently, Follows one step commands with increased time     Cueing Cueing Techniques: Verbal cues     General Comments General comments (skin integrity, edema, etc.): BP WNL sitting with about 12 point drop in SBP to standing pt denies symptoms; wife present and aide present and supportive    Exercises     Assessment/Plan    PT Assessment Patient needs continued PT services  PT Problem List Decreased balance;Decreased mobility;Decreased activity tolerance       PT Treatment Interventions DME instruction;Functional mobility training;Therapeutic exercise;Therapeutic activities;Balance training;Patient/family education    PT Goals (Current goals can be found in the Care Plan section)  Acute Rehab PT Goals Patient Stated Goal: return to independent with transfers PT Goal Formulation: With patient/family Time For Goal Achievement: 04/13/24 Potential to Achieve Goals: Good    Frequency Min 2X/week     Co-evaluation               AM-PAC PT 6 Clicks Mobility  Outcome Measure Help needed turning from your back to your side while in a flat bed without using bedrails?: A Little Help needed moving from lying on your back to sitting on the side of a flat bed without using bedrails?: A Lot Help needed moving to and from a bed to a chair (including a wheelchair)?: A Little Help needed standing up from a chair  using your arms (e.g., wheelchair or bedside chair)?: A Little Help needed to walk in hospital room?: Total Help needed climbing 3-5 steps with a railing? : Total 6 Click Score: 13    End of Session Equipment Utilized During Treatment: Gait belt Activity Tolerance: Patient tolerated treatment well Patient left: in chair;with call bell/phone within reach;with chair alarm set;with family/visitor present   PT Visit Diagnosis: Other abnormalities of gait and mobility (R26.89);Other symptoms and signs involving the nervous system (R29.898);History of falling (Z91.81)    Time: 8352-8284 PT Time Calculation (min) (ACUTE ONLY): 28 min   Charges:   PT Evaluation $PT Eval Low Complexity: 1 Low PT Treatments $Therapeutic Activity: 8-22 mins PT General Charges $$ ACUTE PT VISIT: 1 Visit         Micheline Portal, PT Acute Rehabilitation Services Office:[phone] 03/30/2024   Montie Portal 03/30/2024, 7:39 PM

## 2024-03-30 NOTE — Progress Notes (Signed)
 NAME:  Walter Brown, MRN:  [id number], DOB:  1941/06/16, LOS: 1 ADMISSION DATE:  03/29/2024, CONSULTATION DATE:  03/29/24 REFERRING MD:  Dr Mavis, CHIEF COMPLAINT:  s/p VPS   History of Present Illness:  83 yo M with Afib not on AC, Parkinsons, NPH presented for VPS 6/23. Case unremarkable. There were no progressive beds post operatively so is admitted to the ICU   PCCM is called in this setting   Pertinent  Medical History   Past Medical History:  Diagnosis Date   A-fib [facility])    Allergy    Anemia    mild   Arthritis 04/06/2017   Asthma    childhood   ASVD (arteriosclerotic vascular disease) 09/07/2018   Back pain with radiation 04/26/2013   Low back with LLE radiculopathy   Basal cell carcinoma    skin- on nose- basal cell (20 yrs ago) forehead 1 year ago     Sees Dr Darice Reusing of dermatology   BPH (benign prostatic hyperplasia)    Bradycardia 06/28/2018   Cataract 10/07/2014   Cerumen impaction 08/10/2012   Chicken pox as child   Coronary artery calcification 11/23/2019   Dysphagia 09/11/2021   Elevated LFTs 11/2017   suspected drug/antibiotic induced   Essential hypertension    ETD (Eustachian tube dysfunction), right 01/17/2022   Generalized anxiety disorder    GERD (gastroesophageal reflux disease)    [place] measles as a child   Hemoptysis 01/17/2022   Hyperglycemia 04/25/2020   Hyperlipidemia    Insomnia 02/06/2021   Internal hemorrhoids    Irregular cardiac rhythm 03/10/2018   Lipoma 08/04/2018   Macular degeneration    Major depressive disorder 08/01/2012   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   Nocturia 12/04/2022   Obstructive sleep apnea 11/09/2018   Patient reports mild symptoms; was not prescribed a CPAP machine   Otitis externa 08/10/2012   PAF (paroxysmal atrial fibrillation)    Palpitations 09/07/2018   Parkinson's disease (HCC) 05/19/2019   Recurrent falls 12/04/2022   Thrombocytopenia    TIA (transient  ischemic attack) 03/10/2018   Unsteady gait 09/11/2021   Significant Hospital Events: Including procedures, antibiotic start and stop dates in addition to other pertinent events   6/23-ventriculoperitoneal shunt placed  Interim History / Subjective:  Slightly confused, easily redirectable Uneventful night  Objective    Blood pressure (!) 140/66, pulse (!) 57, temperature (!) 97.5 F (36.4 C), temperature source Axillary, resp. rate 19, height 5' 5 (1.651 m), weight 72.6 kg, SpO2 95%.        Intake/Output Summary (Last 24 hours) at 03/30/2024 9167 Last data filed at 03/30/2024 0800 Gross per 24 hour  Intake 1088.42 ml  Output 1115 ml  Net -26.58 ml   Filed Weights   03/29/24 1233  Weight: 72.6 kg    Examination: General: Elderly, does not appear to be in distress HENT: Moist oral mucosa, surgical site dressed Lungs: Clear breath sounds Cardiovascular: S1-S2 appreciated Abdomen: Soft, bowel sounds appreciated Extremities: No clubbing, no edema Neuro: Awake alert oriented to person GU:   Resolved problem list   Assessment and Plan   Normal pressure hydrocephalus status post ventriculoperitoneal shunt - Postop management per neurosurgery - CT findings noted - Continue neuromonitoring - As needed analgesics - Keppra  Atrial fibrillation Hypertension Hyperlipidemia - Will be able to resume home medications when able to take p.o. - Not on anticoagulation  BPH - resume home meds  History of Parkinson's - Depakote  and  carbidopa /levodopa  - Delirium precautions  GERD - PPI  Best Practice (right click and Reselect all SmartList Selections daily)   Diet/type: Regular consistency (see orders) DVT prophylaxis SCD Pressure ulcer(s): N/A GI prophylaxis: PPI Lines: N/A Foley:  N/A Code Status:  full code Last date of multidisciplinary goals of care discussion [Per primary]  Labs   CBC: No results for input(s): WBC, NEUTROABS, HGB, HCT, MCV,  PLT in the last 168 hours.  Basic Metabolic Panel: No results for input(s): NA, K, CL, CO2, GLUCOSE, BUN, CREATININE, CALCIUM , MG, PHOS in the last 168 hours. GFR: CrCl cannot be calculated (Patient's most recent lab result is older than the maximum 21 days allowed.). No results for input(s): PROCALCITON, WBC, LATICACIDVEN in the last 168 hours.  Liver Function Tests: No results for input(s): AST, ALT, ALKPHOS, BILITOT, PROT, ALBUMIN in the last 168 hours. No results for input(s): LIPASE, AMYLASE in the last 168 hours. No results for input(s): AMMONIA in the last 168 hours.  ABG No results found for: PHART, PCO2ART, PO2ART, HCO3, TCO2, ACIDBASEDEF, O2SAT   Coagulation Profile: No results for input(s): INR, PROTIME in the last 168 hours.  Cardiac Enzymes: No results for input(s): CKTOTAL, CKMB, CKMBINDEX, TROPONINI in the last 168 hours.  HbA1C: Hgb A1c MFr Bld  Date/Time Value Ref Range Status  02/04/2024 11:22 AM 5.2 4.6 - 6.5 % Final    Comment:    Glycemic Control Guidelines for People with Diabetes:Non Diabetic:  <6%Goal of Therapy: <7%Additional Action Suggested:  >8%   10/30/2023 12:00 PM 5.4 4.6 - 6.5 % Final    Comment:    Glycemic Control Guidelines for People with Diabetes:Non Diabetic:  <6%Goal of Therapy: <7%Additional Action Suggested:  >8%     CBG: No results for input(s): GLUCAP in the last 168 hours.  Review of Systems:   Denies a headache Generally well  Past Medical History:  He,  has a past medical history of A-fib (HCC), Allergy, Anemia, Arthritis (04/06/2017), Asthma, ASVD (arteriosclerotic vascular disease) (09/07/2018), Back pain with radiation (04/26/2013), Basal cell carcinoma, BPH (benign prostatic hyperplasia), Bradycardia (06/28/2018), Cataract (10/07/2014), Cerumen impaction (08/10/2012), Chicken pox (as child), Coronary artery calcification (11/23/2019), Dysphagia  (09/11/2021), Elevated LFTs (11/2017), Essential hypertension, ETD (Eustachian tube dysfunction), right (01/17/2022), Generalized anxiety disorder, GERD (gastroesophageal reflux disease), [place] measles (as a child), Hemoptysis (01/17/2022), Hyperglycemia (04/25/2020), Hyperlipidemia, Insomnia (02/06/2021), Internal hemorrhoids, Irregular cardiac rhythm (03/10/2018), Lipoma (08/04/2018), Macular degeneration, Major depressive disorder (08/01/2012), 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   Surgical History:   Past Surgical History:  Procedure Laterality Date   BELPHAROPTOSIS REPAIR     very young, b/l   EXCISIONAL HEMORRHOIDECTOMY     EYE SURGERY  2017   eyelids   HYDROCELE EXCISION / REPAIR  2012   b/l   SKIN CANCER EXCISION     nose and forehead, basal cell CA   UPPER GASTROINTESTINAL ENDOSCOPY       Social History:   reports that he quit smoking about 45 years ago. His smoking use included cigarettes. He started smoking about 45 years ago. He has a 22.5 pack-year smoking history. He has never used smokeless tobacco. He reports current alcohol use of about 7.0 standard drinks of alcohol per week. He reports that he does not currently use drugs.   Family  History:  His family history includes ADD / ADHD in his mother and son; Alcohol abuse in his maternal uncle, son, and son; Arthritis in his mother; Asthma in his maternal grandfather, maternal uncle, and mother; Cancer in his maternal grandfather and paternal grandfather; Cancer (age of onset: 40) in his father; Depression in his maternal uncle, mother, and son; Hearing loss in his father and  paternal grandfather; Heart disease in his father, mother, and paternal uncle; Hypertension in his father and mother; Other in his mother; Other (age of onset: 18) in his son; Parkinson's disease in his father; Prostate cancer in his maternal grandfather; Prostate cancer (age of onset: 33) in his father; Stroke in his father and maternal grandmother; Varicose Veins in his father; Vision loss in his father and mother. There is no history of Colon cancer, Esophageal cancer, Rectal cancer, or Stomach cancer.   Allergies Allergies  Allergen Reactions   Albuterol  Sulfate Palpitations   Azithromycin Other (See Comments)    Hepatotoxicity   Doxycycline Hyclate Other (See Comments)    Taken with Azithromycin and had Heaptotoxicity    Jennet Epley, MD [place] [facility] Pager: See Tracey

## 2024-03-30 NOTE — Plan of Care (Signed)
  Problem: Clinical Measurements: Goal: Will remain free from infection Outcome: Progressing Goal: Diagnostic test results will improve Outcome: Progressing   Problem: Activity: Goal: Risk for activity intolerance will decrease Outcome: Progressing   Problem: Nutrition: Goal: Adequate nutrition will be maintained Outcome: Progressing   Problem: Coping: Goal: Level of anxiety will decrease Outcome: Progressing   Problem: Elimination: Goal: Will not experience complications related to urinary retention Outcome: Progressing   Problem: Pain Managment: Goal: General experience of comfort will improve and/or be controlled Outcome: Progressing   Problem: Safety: Goal: Ability to remain free from injury will improve Outcome: Progressing   Problem: Skin Integrity: Goal: Risk for impaired skin integrity will decrease Outcome: Progressing   Problem: Clinical Measurements: Goal: Usual level of consciousness will be regained or maintained. Outcome: Progressing Goal: Neurologic status will improve Outcome: Progressing

## 2024-03-30 NOTE — TOC CM/SW Note (Signed)
 Transition of Care [facility]) - Inpatient Brief Assessment   Patient Details  Name: Walter Brown MRN: [id number] Date of Birth: [birth year]  Transition of Care [facility]) CM/SW Contact:    Tom-Johnson, Manvi Guilliams Daphne, RN Phone Number: 03/30/2024, 3:35 PM   Clinical Narrative:  Patient presented with Unsteady Gait, Incontinence and some Memory difficulties. MRI Brain showed Ventriculomegaly.  Patient underwent placement of Rt Retroauricular Ventriculoperitoneal Shunt yesterday 03/30/23 by Neuro Sx. Post-op CT showed Small volume Postop Pneumocephalus, plan for a Shunt Revision tomorrow 03/31/24 by Neuro sx.  Continues on IV abx.  Patient not Medically ready for discharge.  CM will continue to follow for needs as patient progresses with care towards discharge.            Transition of Care Asessment:

## 2024-03-30 NOTE — Progress Notes (Signed)
 Subjective: The patient is alert and pleasant.  He has been slightly confused.  He is in no apparent distress.  Objective: Vital signs in last 24 hours: Temp:  [97.5 F (36.4 C)-98.2 F (36.8 C)] 97.5 F (36.4 C) (06/24 0000) Pulse Rate:  [54-135] 55 (06/24 0700) Resp:  [15-27] 17 (06/24 0700) BP: (100-167)/(56-101) 138/70 (06/24 0700) SpO2:  [91 %-99 %] 97 % (06/24 0700) Weight:  [72.6 kg] 72.6 kg (06/23 1233) Estimated body mass index is 26.63 kg/m as calculated from the following:   Height as of this encounter: 5' 5 (1.651 m).   Weight as of this encounter: 72.6 kg.   Intake/Output from previous day: 06/23 0701 - 06/24 0700 In: 1059.4 [I.V.:754.4; IV Piggyback:305.1] Out: 815 [Urine:800; Blood:15] Intake/Output this shift: No intake/output data recorded.  Physical exam  The patient is alert and pleasant.  He is mildly confused.  He knows its at the hospital.  He is moving all 4 extremities well.  His dressings are clean and dry.  I reviewed the patient's head CT.  His ventriculostomy is not quite in the ventricle.  It does however go through his capacious insula.  Lab Results: No results for input(s): WBC, HGB, HCT, PLT in the last 72 hours. BMET No results for input(s): NA, K, CL, CO2, GLUCOSE, BUN, CREATININE, CALCIUM  in the last 72 hours.  Studies/Results: No results found.  Assessment/Plan: Status post ventriculoperitoneal shunt: His CAT scan as above shows his ventricular catheter is not in the ventricle but does traverse his large CSF filled insula.  At surgery I got the free flow of spinal fluid from the ventricular catheter and out the distal end of the peritoneal catheter indicative of patency.  I am inclined to leave the ventricular catheter where it is as it since although it may not be ideally placed it is draining spinal fluid.  I will plan to see how he does with this shunt and not revise it for now.  I will discuss this with his  family.  LOS: 1 day     Reyes JONETTA Budge 03/30/2024, 7:42 AM     Patient ID: Walter Brown, male   DOB: 02-19-1941, 83 y.o.   MRN: [id number]

## 2024-03-31 ENCOUNTER — Encounter (HOSPITAL_COMMUNITY): Payer: Self-pay | Admitting: Neurosurgery

## 2024-03-31 ENCOUNTER — Inpatient Hospital Stay (HOSPITAL_COMMUNITY): Payer: Self-pay

## 2024-03-31 ENCOUNTER — Encounter (HOSPITAL_COMMUNITY)
Admission: RE | Disposition: A | Discharge status: Home Health | Payer: Self-pay | Source: Home / Self Care | Attending: Neurosurgery

## 2024-03-31 DIAGNOSIS — I4891 Unspecified atrial fibrillation: Secondary | ICD-10-CM | POA: Diagnosis not present

## 2024-03-31 DIAGNOSIS — E785 Hyperlipidemia, unspecified: Secondary | ICD-10-CM | POA: Diagnosis not present

## 2024-03-31 DIAGNOSIS — I10 Essential (primary) hypertension: Secondary | ICD-10-CM | POA: Diagnosis not present

## 2024-03-31 DIAGNOSIS — G912 (Idiopathic) normal pressure hydrocephalus: Secondary | ICD-10-CM | POA: Diagnosis not present

## 2024-03-31 DIAGNOSIS — I2510 Atherosclerotic heart disease of native coronary artery without angina pectoris: Secondary | ICD-10-CM

## 2024-03-31 HISTORY — PX: SHUNT REVISION VENTRICULAR-PERITONEAL: SHX6094

## 2024-03-31 SURGERY — REVISION, SHUNT, VENTRICULOPERITONEAL
Anesthesia: General | Site: Head | Laterality: Right

## 2024-03-31 MED ORDER — PROPOFOL 10 MG/ML IV BOLUS
INTRAVENOUS | Status: AC
  Filled 2024-03-31: qty 20

## 2024-03-31 MED ORDER — CEFAZOLIN SODIUM-DEXTROSE 2-4 GM/100ML-% IV SOLN
2.0000 g | Freq: Three times a day (TID) | INTRAVENOUS | Status: DC
  Administered 2024-03-31 – 2024-04-01 (×3): 2 g via INTRAVENOUS
  Filled 2024-03-31 (×3): qty 100

## 2024-03-31 MED ORDER — ONDANSETRON HCL 4 MG/2ML IJ SOLN: 4.0000 mg | Freq: Once | INTRAMUSCULAR | Status: DC | PRN

## 2024-03-31 MED ORDER — FENTANYL CITRATE (PF) 250 MCG/5ML IJ SOLN
INTRAMUSCULAR | Status: DC | PRN
  Administered 2024-03-31 (×2): 50 ug via INTRAVENOUS

## 2024-03-31 MED ORDER — LEVETIRACETAM 500 MG PO TABS
500.0000 mg | ORAL_TABLET | Freq: Two times a day (BID) | ORAL | Status: DC
  Administered 2024-03-31 – 2024-04-01 (×2): 500 mg via ORAL
  Filled 2024-03-31 (×2): qty 1

## 2024-03-31 MED ORDER — CHLORHEXIDINE GLUCONATE 0.12 % MT SOLN: 15.0000 mL | Freq: Once | OROMUCOSAL | Status: AC

## 2024-03-31 MED ORDER — FENTANYL CITRATE (PF) 100 MCG/2ML IJ SOLN: 25.0000 ug | INTRAMUSCULAR | Status: DC | PRN

## 2024-03-31 MED ORDER — BACITRACIN ZINC 500 UNIT/GM EX OINT
TOPICAL_OINTMENT | CUTANEOUS | Status: AC
  Filled 2024-03-31: qty 28.35

## 2024-03-31 MED ORDER — SUGAMMADEX SODIUM 200 MG/2ML IV SOLN
INTRAVENOUS | Status: DC | PRN
  Administered 2024-03-31: 200 mg via INTRAVENOUS

## 2024-03-31 MED ORDER — OXYCODONE HCL 5 MG PO TABS: 5.0000 mg | ORAL_TABLET | Freq: Once | ORAL | Status: DC | PRN

## 2024-03-31 MED ORDER — CHLORHEXIDINE GLUCONATE 0.12 % MT SOLN
OROMUCOSAL | Status: AC
  Administered 2024-03-31: 15 mL via OROMUCOSAL
  Filled 2024-03-31: qty 15

## 2024-03-31 MED ORDER — ONDANSETRON HCL 4 MG/2ML IJ SOLN
INTRAMUSCULAR | Status: DC | PRN
  Administered 2024-03-31: 4 mg via INTRAVENOUS

## 2024-03-31 MED ORDER — ROCURONIUM BROMIDE 10 MG/ML (PF) SYRINGE
PREFILLED_SYRINGE | INTRAVENOUS | Status: AC
  Filled 2024-03-31: qty 10

## 2024-03-31 MED ORDER — ORAL CARE MOUTH RINSE: 15.0000 mL | Freq: Once | OROMUCOSAL | Status: AC

## 2024-03-31 MED ORDER — FENTANYL CITRATE (PF) 100 MCG/2ML IJ SOLN
INTRAMUSCULAR | Status: AC
  Filled 2024-03-31: qty 2

## 2024-03-31 MED ORDER — EPHEDRINE 5 MG/ML INJ
INTRAVENOUS | Status: AC
  Filled 2024-03-31: qty 5

## 2024-03-31 MED ORDER — ACETAMINOPHEN 10 MG/ML IV SOLN: 1000.0000 mg | Freq: Once | INTRAVENOUS | Status: DC | PRN

## 2024-03-31 MED ORDER — ONDANSETRON HCL 4 MG/2ML IJ SOLN
INTRAMUSCULAR | Status: AC
Start: 2024-03-31 — End: 2024-03-31
  Filled 2024-03-31: qty 2

## 2024-03-31 MED ORDER — LIDOCAINE 2% (20 MG/ML) 5 ML SYRINGE
INTRAMUSCULAR | Status: AC
  Filled 2024-03-31: qty 5

## 2024-03-31 MED ORDER — DEXAMETHASONE SODIUM PHOSPHATE 10 MG/ML IJ SOLN
INTRAMUSCULAR | Status: DC | PRN
  Administered 2024-03-31: 5 mg via INTRAVENOUS

## 2024-03-31 MED ORDER — ROCURONIUM BROMIDE 10 MG/ML (PF) SYRINGE
PREFILLED_SYRINGE | INTRAVENOUS | Status: DC | PRN
  Administered 2024-03-31: 60 mg via INTRAVENOUS

## 2024-03-31 MED ORDER — PROPOFOL 10 MG/ML IV BOLUS
INTRAVENOUS | Status: DC | PRN
  Administered 2024-03-31: 90 mg via INTRAVENOUS

## 2024-03-31 MED ORDER — SODIUM CHLORIDE 0.9 % IV SOLN: INTRAVENOUS | Status: DC

## 2024-03-31 MED ORDER — PHENYLEPHRINE 80 MCG/ML (10ML) SYRINGE FOR IV PUSH (FOR BLOOD PRESSURE SUPPORT)
PREFILLED_SYRINGE | INTRAVENOUS | Status: AC
  Filled 2024-03-31: qty 10

## 2024-03-31 MED ORDER — PANTOPRAZOLE SODIUM 40 MG PO TBEC
40.0000 mg | DELAYED_RELEASE_TABLET | Freq: Every day | ORAL | Status: DC
  Administered 2024-03-31: 40 mg via ORAL
  Filled 2024-03-31: qty 1

## 2024-03-31 MED ORDER — LIDOCAINE 2% (20 MG/ML) 5 ML SYRINGE
INTRAMUSCULAR | Status: DC | PRN
  Administered 2024-03-31: 100 mg via INTRAVENOUS

## 2024-03-31 MED ORDER — OXYCODONE HCL 5 MG/5ML PO SOLN: 5.0000 mg | Freq: Once | ORAL | Status: DC | PRN

## 2024-03-31 MED ORDER — EPHEDRINE SULFATE-NACL 50-0.9 MG/10ML-% IV SOSY
PREFILLED_SYRINGE | INTRAVENOUS | Status: DC | PRN
  Administered 2024-03-31: 10 mg via INTRAVENOUS
  Administered 2024-03-31: 5 mg via INTRAVENOUS

## 2024-03-31 SURGICAL SUPPLY — 60 items
BAG COUNTER SPONGE SURGICOUNT (BAG) ×1 IMPLANT
BENZOIN TINCTURE PRP APPL 2/3 (GAUZE/BANDAGES/DRESSINGS) ×1 IMPLANT
BLADE CLIPPER SURG (BLADE) ×2 IMPLANT
BLADE SURG 10 STRL SS (BLADE) ×2 IMPLANT
BLADE SURG 15 STRL LF DISP TIS (BLADE) ×1 IMPLANT
BUR ACORN 6.0 PRECISION (BURR) IMPLANT
CABLE BIPOLOR RESECTION CORD (MISCELLANEOUS) ×1 IMPLANT
CANISTER SUCTION 3000ML PPV (SUCTIONS) ×1 IMPLANT
CATH VENTRICULAR 14CMX1.4MM (Miscellaneous) IMPLANT
CLAMP SUTURE YELLOW 5 PAIRS (MISCELLANEOUS) ×1 IMPLANT
CLIP RANEY DISP (INSTRUMENTS) IMPLANT
COVER MAYO STAND STRL (DRAPES) ×1 IMPLANT
DRAPE HALF SHEET 40X57 (DRAPES) ×1 IMPLANT
DRAPE INCISE IOBAN 85X60 (DRAPES) ×1 IMPLANT
DRAPE SURG 17X23 STRL (DRAPES) ×6 IMPLANT
DRAPE SURG ORHT 6 SPLT 77X108 (DRAPES) ×1 IMPLANT
DRSG OPSITE POSTOP 4X6 (GAUZE/BANDAGES/DRESSINGS) IMPLANT
ELECT CAUTERY BLADE 6.4 (BLADE) ×1 IMPLANT
ELECTRODE REM PT RTRN 9FT ADLT (ELECTROSURGICAL) ×1 IMPLANT
GAUZE 4X4 16PLY ~~LOC~~+RFID DBL (SPONGE) ×2 IMPLANT
GLOVE BIO SURGEON STRL SZ8 (GLOVE) ×1 IMPLANT
GLOVE BIO SURGEON STRL SZ8.5 (GLOVE) ×1 IMPLANT
GLOVE EXAM NITRILE XL STR (GLOVE) IMPLANT
GOWN STRL REUS W/ TWL LRG LVL3 (GOWN DISPOSABLE) IMPLANT
GOWN STRL REUS W/ TWL XL LVL3 (GOWN DISPOSABLE) IMPLANT
KIT BASIN OR (CUSTOM PROCEDURE TRAY) ×1 IMPLANT
KIT TURNOVER KIT B (KITS) ×1 IMPLANT
NDL HYPO 22X1.5 SAFETY MO (MISCELLANEOUS) ×1 IMPLANT
NEEDLE HYPO 22X1.5 SAFETY MO (MISCELLANEOUS) ×1 IMPLANT
NS IRRIG 1000ML POUR BTL (IV SOLUTION) ×1 IMPLANT
PACK EENT II TURBAN DRAPE (CUSTOM PROCEDURE TRAY) ×1 IMPLANT
PAD ARMBOARD POSITIONER FOAM (MISCELLANEOUS) ×3 IMPLANT
PATTIES SURGICAL .5 X.5 (GAUZE/BANDAGES/DRESSINGS) ×1 IMPLANT
PATTIES SURGICAL 1X1 (DISPOSABLE) ×1 IMPLANT
PENCIL BUTTON HOLSTER BLD 10FT (ELECTRODE) ×1 IMPLANT
SHEATH PERITONEAL INTRO 46 (SHEATH) IMPLANT
SHEATH PERITONEAL INTRO 61 (SHEATH) IMPLANT
SPONGE T-LAP 4X18 ~~LOC~~+RFID (SPONGE) ×1 IMPLANT
STAPLER SKIN PROX 35W (STAPLE) ×1 IMPLANT
STRIP CLOSURE SKIN 1/2X4 (GAUZE/BANDAGES/DRESSINGS) ×1 IMPLANT
SUT BONE WAX W31G (SUTURE) IMPLANT
SUT ETHILON 3 0 PS 1 (SUTURE) IMPLANT
SUT NURALON 4 0 TR CR/8 (SUTURE) IMPLANT
SUT SILK 0 100YDS SPOOL (SUTURE) IMPLANT
SUT SILK 3 0 SH 30 (SUTURE) ×1 IMPLANT
SUT SILK 3 0 TIES 10X30 (SUTURE) IMPLANT
SUT SILK 3-0 18XBRD TIE BLK (SUTURE) IMPLANT
SUT VIC AB 1 CT1 18XBRD ANBCTR (SUTURE) IMPLANT
SUT VIC AB 2-0 CP2 18 (SUTURE) ×1 IMPLANT
SUT VIC AB 3-0 SH 8-18 (SUTURE) ×1 IMPLANT
SYR 30ML SLIP (SYRINGE) ×1 IMPLANT
SYR 5ML LL (SYRINGE) IMPLANT
SYR BULB EAR ULCER 3OZ GRN STR (SYRINGE) ×1 IMPLANT
SYR CONTROL 10ML LL (SYRINGE) ×1 IMPLANT
TOWEL GREEN STERILE (TOWEL DISPOSABLE) ×1 IMPLANT
TOWEL GREEN STERILE FF (TOWEL DISPOSABLE) ×1 IMPLANT
TRAY FOLEY MTR SLVR 16FR STAT (SET/KITS/TRAYS/PACK) IMPLANT
TUBE CONNECTING 12X1/4 (SUCTIONS) ×1 IMPLANT
UNDERPAD 30X36 HEAVY ABSORB (UNDERPADS AND DIAPERS) ×1 IMPLANT
WATER STERILE IRR 1000ML POUR (IV SOLUTION) ×1 IMPLANT

## 2024-03-31 NOTE — TOC Initial Note (Signed)
 Transition of Care [facility]) - Initial/Assessment Note    Patient Details  Name: Walter Brown MRN: [id number] Date of Birth: [birth year]  Transition of Care [facility]) CM/SW Contact:    Marval Gell, RN Phone Number: 03/31/2024, 9:53 AM  Clinical Narrative:                  Patient admitted from home w wife for hydrocephalus. VP shunt placed 6/23. Confirmed patient is active with [facility] prior to admission for [facility] PT.  TOC will continue to follow for DC planning    Expected Discharge Plan: Home w Home Health Services Barriers to Discharge: Continued Medical Work up   Patient Goals and CMS Choice            Expected Discharge Plan and Services   Discharge Planning Services: CM Consult                               HH Arranged: PT [facility] Agency: [facility] Date [facility] Agency Contacted: 03/31/24 Time HH Agency Contacted: [phone] Representative spoke with at [facility] Agency: Confirmed w Darleene patient is active for [facility] PT prior to admission  Prior Living Arrangements/Services                       Activities of Daily Living      Permission Sought/Granted                  Emotional Assessment              Admission diagnosis:  Normal pressure hydrocephalus (HCC) [G91.2] (Idiopathic) normal pressure hydrocephalus (HCC) [G91.2] Patient Active Problem List   Diagnosis Date Noted   (Idiopathic) normal pressure hydrocephalus (HCC) 03/29/2024   Normal pressure hydrocephalus (HCC) 03/29/2024   Bilateral impacted cerumen 03/16/2024   Bilateral hearing loss due to cerumen impaction 03/08/2024   Anemia 03/07/2024   UTI (urinary tract infection) 03/02/2024   Severe sepsis (HCC) 03/02/2024   Acute metabolic encephalopathy 03/02/2024   Hypokalemia 03/02/2024   Recurrent falls 12/04/2022   Nocturia 12/04/2022   Hemoptysis 01/17/2022   ETD (Eustachian tube dysfunction), right 01/17/2022   Dysphagia 09/11/2021   Unsteady gait 09/11/2021   Insomnia 02/06/2021    Major depressive disorder 04/25/2020   Hyperglycemia 04/25/2020   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   Coronary artery calcification 11/23/2019   Parkinson's disease (HCC) 05/19/2019   Obstructive sleep apnea 11/09/2018   PAF (paroxysmal atrial fibrillation) 09/09/2018   Palpitations 09/07/2018   ASVD (arteriosclerotic vascular disease) 09/07/2018   Lipoma 08/04/2018   Bradycardia 06/28/2018   Cough 06/23/2018   TIA (transient ischemic attack) 03/10/2018   Arthritis 04/06/2017   Macular degeneration 03/03/2015   Back pain with radiation 04/26/2013   Respiratory illness    Hyperlipidemia    Essential hypertension    BPH (benign prostatic hyperplasia)    Basal cell carcinoma    Thrombocytopenia    PCP:  Domenica Harlene LABOR, MD Pharmacy:   [facility] [phone] - [place], [place] - [address] [place] [address] Phone: [phone] Fax: [phone]  [facility] [address] [place] [address] Phone: [phone] Fax: [phone]     Social Drivers of Health (SDOH) Social History: SDOH Screenings   Food Insecurity: No Food Insecurity (03/29/2024)  Housing: Low Risk  (  03/29/2024)  Transportation Needs: No Transportation Needs (03/29/2024)  Utilities: Not At Risk (03/29/2024)  Alcohol Screen: Low Risk  (04/01/2023)  Depression (PHQ2-9): Low Risk  (03/16/2024)  Financial Resource Strain: Low Risk  (03/26/2022)  Physical Activity: Sufficiently Active (03/26/2022)  Social Connections: Moderately Isolated (03/29/2024)  Stress: Stress Concern Present (03/26/2022)  Tobacco Use: Medium Risk (03/29/2024)   SDOH Interventions: Transportation Interventions: Inpatient TOC   Readmission Risk Interventions     No data to display

## 2024-03-31 NOTE — Anesthesia Procedure Notes (Signed)
 Procedure Name: Intubation Date/Time: 03/31/2024 4:52 PM  Performed by: Jerl Donald LABOR, CRNAPre-anesthesia Checklist: Patient identified, Emergency Drugs available, Suction available and Patient being monitored Patient Re-evaluated:Patient Re-evaluated prior to induction Oxygen  Delivery Method: Circle System Utilized Preoxygenation: Pre-oxygenation with 100% oxygen  Induction Type: IV induction Ventilation: Mask ventilation without difficulty Laryngoscope Size: Mac and 4 Grade View: Grade II Tube type: Oral Tube size: 7.5 mm Number of attempts: 1 Airway Equipment and Method: Stylet Placement Confirmation: ETT inserted through vocal cords under direct vision, positive ETCO2 and breath sounds checked- equal and bilateral Secured at: 23 cm Tube secured with: Tape Dental Injury: Teeth and Oropharynx as per pre-operative assessment

## 2024-03-31 NOTE — Progress Notes (Signed)
 Subjective: The patient is alert and pleasant.  He is in no apparent distress.  Objective: Vital signs in last 24 hours: Temp:  [97.6 F (36.4 C)-98.1 F (36.7 C)] 97.6 F (36.4 C) (06/25 0400) Pulse Rate:  [53-96] 96 (06/25 0700) Resp:  [15-23] 18 (06/25 0700) BP: (106-170)/(55-101) 130/93 (06/25 0700) SpO2:  [87 %-99 %] 97 % (06/25 0732) FiO2 (%):  [21 %-28 %] 28 % (06/25 0400) Estimated body mass index is 26.63 kg/m as calculated from the following:   Height as of this encounter: 5' 5 (1.651 m).   Weight as of this encounter: 72.6 kg.   Intake/Output from previous day: 06/24 0701 - 06/25 0700 In: 1653.6 [P.O.:240; I.V.:1103.4; IV Piggyback:310.2] Out: 2425 [Urine:2425] Intake/Output this shift: Total I/O In: -  Out: 600 [Urine:600]  Physical exam the patient is alert and oriented.  He is in no apparent distress.  He is moving all 4 extremities well.  Lab Results: No results for input(s): WBC, HGB, HCT, PLT in the last 72 hours. BMET No results for input(s): NA, K, CL, CO2, GLUCOSE, BUN, CREATININE, CALCIUM  in the last 72 hours.  Studies/Results: CT HEAD WO CONTRAST Result Date: 03/30/2024 CLINICAL DATA:  83 year old male with recent delirium and nonspecific ventriculomegaly. EXAM: CT HEAD WITHOUT CONTRAST TECHNIQUE: Contiguous axial images were obtained from the base of the skull through the vertex without intravenous contrast. RADIATION DOSE REDUCTION: This exam was performed according to the departmental dose-optimization program which includes automated exposure control, adjustment of the mA and/or kV according to patient size and/or use of iterative reconstruction technique. COMPARISON:  Head CT 03/02/2024 the and earlier, including 07/02/2012. FINDINGS: Brain: New right posterolateral approach ventriculostomy. The catheter does not appear to communicate with the ventricular system (series 3, image 17 and coronal image 36. Small volume  postoperative pneumocephalus. Unchanged ventricle size and configuration. No pneumo ventricle identified. No superimposed midline shift, mass effect, evidence of mass lesion, intracranial hemorrhage or evidence of cortically based acute infarction. Stable gray-white matter differentiation throughout the brain. Vascular: No suspicious intracranial vascular hyperdensity. Skull: New right posterior lateral burr hole. Otherwise stable and intact. Sinuses/Orbits: Visualized paranasal sinuses and mastoids are stable and well aerated. Other: New postoperative changes to the right scalp. Skin staples are in place. Right posterolateral convexity shunt reservoir and tubing with regional mild postoperative soft tissue gas. Visualized orbit soft tissues are within normal limits. IMPRESSION: 1. New right posterolateral approach ventriculostomy. The catheter does Not appear to communicate with the ventricular system. Small volume postoperative pneumocephalus. 2. No other acute intracranial abnormality. Unchanged ventricle size and configuration. Dr. REYES Brown Progress Note on this patient from 0742 hours today indicates he is aware of #1. Electronically Signed   By: [name]   On: 03/30/2024 14:28    Assessment/Plan: Ventriculoperitoneal shunt malplacement: I discussed situation with patient and recommended repositioning of the ventricular catheter.  I have answered all his questions.  He wants to proceed.  LOS: 2 days     Walter Brown 03/31/2024, 8:01 AM     Patient ID: Walter Brown, male   DOB: 18-Apr-1941, 83 y.o.   MRN: [id number]

## 2024-03-31 NOTE — Anesthesia Preprocedure Evaluation (Addendum)
 Anesthesia Evaluation  Patient identified by MRN, date of birth, ID band Patient awake    Reviewed: Allergy & Precautions, NPO status , Patient's Chart, lab work & pertinent test results, reviewed documented beta blocker date and time   History of Anesthesia Complications Negative for: history of anesthetic complications  Airway Mallampati: II  TM Distance: >3 FB   Mouth opening: Limited Mouth Opening  Dental no notable dental hx.    Pulmonary asthma , sleep apnea , former smoker   breath sounds clear to auscultation       Cardiovascular hypertension, + CAD  (-) Past MI, (-) Cardiac Stents and (-) CABG + dysrhythmias Atrial Fibrillation  Rhythm:Regular Rate:Normal  (2019)  Nuclear stress EF: 55%.  Blood pressure demonstrated a normal response to exercise.  There was no ST segment deviation noted during stress.  The study is normal.  This is a low risk study.  The left ventricular ejection fraction is normal (55-65%).      Neuro/Psych neg Seizures PSYCHIATRIC DISORDERS Anxiety Depression    NPH PD TIA   GI/Hepatic ,GERD  ,,(+) neg Cirrhosis        Endo/Other    Renal/GU Renal disease     Musculoskeletal  (+) Arthritis ,    Abdominal   Peds  Hematology  (+) Blood dyscrasia, anemia   Anesthesia Other Findings   Reproductive/Obstetrics                             Anesthesia Physical Anesthesia Plan  ASA: 3  Anesthesia Plan: General   Post-op Pain Management:    Induction: Intravenous  PONV Risk Score and Plan: 2 and Ondansetron  and Dexamethasone   Airway Management Planned: Oral ETT  Additional Equipment:   Intra-op Plan:   Post-operative Plan: Extubation in OR  Informed Consent: I have reviewed the patients History and Physical, chart, labs and discussed the procedure including the risks, benefits and alternatives for the proposed anesthesia with the patient or  authorized representative who has indicated his/her understanding and acceptance.     Dental advisory given  Plan Discussed with: CRNA  Anesthesia Plan Comments:         Anesthesia Quick Evaluation

## 2024-03-31 NOTE — Anesthesia Postprocedure Evaluation (Signed)
 Anesthesia Post Note  Patient: Walter Brown  Procedure(s) Performed: SHUNT INSERTION VENTRICULAR-PERITONEAL (Right: Head)     Patient location during evaluation: PACU Anesthesia Type: General Level of consciousness: awake Pain management: pain level controlled Vital Signs Assessment: post-procedure vital signs reviewed and stable Respiratory status: spontaneous breathing, nonlabored ventilation and respiratory function stable Cardiovascular status: blood pressure returned to baseline and stable Postop Assessment: no apparent nausea or vomiting Anesthetic complications: no   No notable events documented.  Last Vitals:  Vitals:   03/31/24 0500 03/31/24 0600  BP: 139/69 (!) 140/82  Pulse: (!) 53 87  Resp: 18 15  Temp:    SpO2: 98% 99%    Last Pain:  Vitals:   03/31/24 0600  TempSrc:   PainSc: 0-No pain                 Nickcole Bralley P Hayven Fatima

## 2024-03-31 NOTE — Progress Notes (Signed)
 NAME:  Walter Brown, MRN:  [id number], DOB:  1941/05/15, LOS: 2 ADMISSION DATE:  03/29/2024, CONSULTATION DATE:  03/29/24 REFERRING MD:  Dr Mavis, CHIEF COMPLAINT:  s/p VPS   History of Present Illness:  83 yo M with Afib not on AC, Parkinsons, NPH presented for VPS 6/23. Case unremarkable. There were no progressive beds post operatively so is admitted to the ICU   PCCM is called in this setting   Pertinent  Medical History   Past Medical History:  Diagnosis Date   A-fib [facility])    Allergy    Anemia    mild   Arthritis 04/06/2017   Asthma    childhood   ASVD (arteriosclerotic vascular disease) 09/07/2018   Back pain with radiation 04/26/2013   Low back with LLE radiculopathy   Basal cell carcinoma    skin- on nose- basal cell (20 yrs ago) forehead 1 year ago     Sees Dr Darice Reusing of dermatology   BPH (benign prostatic hyperplasia)    Bradycardia 06/28/2018   Cataract 10/07/2014   Cerumen impaction 08/10/2012   Chicken pox as child   Coronary artery calcification 11/23/2019   Dysphagia 09/11/2021   Elevated LFTs 11/2017   suspected drug/antibiotic induced   Essential hypertension    ETD (Eustachian tube dysfunction), right 01/17/2022   Generalized anxiety disorder    GERD (gastroesophageal reflux disease)    [place] measles as a child   Hemoptysis 01/17/2022   Hyperglycemia 04/25/2020   Hyperlipidemia    Insomnia 02/06/2021   Internal hemorrhoids    Irregular cardiac rhythm 03/10/2018   Lipoma 08/04/2018   Macular degeneration    Major depressive disorder 08/01/2012   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   Nocturia 12/04/2022   Obstructive sleep apnea 11/09/2018   Patient reports mild symptoms; was not prescribed a CPAP machine   Otitis externa 08/10/2012   PAF (paroxysmal atrial fibrillation)    Palpitations 09/07/2018   Parkinson's disease (HCC) 05/19/2019   Recurrent falls 12/04/2022   Thrombocytopenia    TIA (transient  ischemic attack) 03/10/2018   Unsteady gait 09/11/2021   Significant Hospital Events: Including procedures, antibiotic start and stop dates in addition to other pertinent events   6/23-ventriculoperitoneal shunt placed  Interim History / Subjective:  Awake alert interactive Does not appear to be in distress  Objective    Blood pressure (!) 124/103, pulse 71, temperature 98.6 F (37 C), temperature source Oral, resp. rate 18, height 5' 5 (1.651 m), weight 72.6 kg, SpO2 96%.    FiO2 (%):  [21 %-28 %] 28 %   Intake/Output Summary (Last 24 hours) at 03/31/2024 1024 Last data filed at 03/31/2024 0800 Gross per 24 hour  Intake 1376.01 ml  Output 2475 ml  Net -1098.99 ml   Filed Weights   03/29/24 1233  Weight: 72.6 kg    Examination: General: Elderly, appears comfortable HENT: Moist oral mucosa, surgical site dressed  Lungs: Clear breath sounds Cardiovascular: S1-S2 appreciated Abdomen: Bowel sounds appreciated Extremities: No clubbing, no edema Neuro: Awake alert oriented to person GU:   Resolved problem list   Assessment and Plan   Normal pressure hydrocephalus s/p VP shunt placement - Postop management per neurosurgery - Based on CT scan findings, repositioning of shunt advised and patient agrees to proceed  - Continue neuromonitoring  Atrial fibrillation Hypertension Hyperlipidemia - Not on anticoagulation - Home meds resumed  BPH - Home meds resumed  History of Parkinson's disease - Depakote ,  carbidopa /levodopa  -Delirium precautions  GERD - PPI  Best Practice (right click and Reselect all SmartList Selections daily)   Diet/type: Regular consistency (see orders) DVT prophylaxis SCD Pressure ulcer(s): N/A GI prophylaxis: PPI Lines: N/A Foley:  N/A Code Status:  full code Last date of multidisciplinary goals of care discussion [Per primary]  Labs   CBC: No results for input(s): WBC, NEUTROABS, HGB, HCT, MCV, PLT in the last 168  hours.  Basic Metabolic Panel: No results for input(s): NA, K, CL, CO2, GLUCOSE, BUN, CREATININE, CALCIUM , MG, PHOS in the last 168 hours. GFR: CrCl cannot be calculated (Patient's most recent lab result is older than the maximum 21 days allowed.). No results for input(s): PROCALCITON, WBC, LATICACIDVEN in the last 168 hours.  Liver Function Tests: No results for input(s): AST, ALT, ALKPHOS, BILITOT, PROT, ALBUMIN in the last 168 hours. No results for input(s): LIPASE, AMYLASE in the last 168 hours. No results for input(s): AMMONIA in the last 168 hours.  ABG No results found for: PHART, PCO2ART, PO2ART, HCO3, TCO2, ACIDBASEDEF, O2SAT   Coagulation Profile: No results for input(s): INR, PROTIME in the last 168 hours.  Cardiac Enzymes: No results for input(s): CKTOTAL, CKMB, CKMBINDEX, TROPONINI in the last 168 hours.  HbA1C: Hgb A1c MFr Bld  Date/Time Value Ref Range Status  02/04/2024 11:22 AM 5.2 4.6 - 6.5 % Final    Comment:    Glycemic Control Guidelines for People with Diabetes:Non Diabetic:  <6%Goal of Therapy: <7%Additional Action Suggested:  >8%   10/30/2023 12:00 PM 5.4 4.6 - 6.5 % Final    Comment:    Glycemic Control Guidelines for People with Diabetes:Non Diabetic:  <6%Goal of Therapy: <7%Additional Action Suggested:  >8%     CBG: No results for input(s): GLUCAP in the last 168 hours.  Review of Systems:   Denies a headache Generally well  Past Medical History:  He,  has a past medical history of A-fib (HCC), Allergy, Anemia, Arthritis (04/06/2017), Asthma, ASVD (arteriosclerotic vascular disease) (09/07/2018), Back pain with radiation (04/26/2013), Basal cell carcinoma, BPH (benign prostatic hyperplasia), Bradycardia (06/28/2018), Cataract (10/07/2014), Cerumen impaction (08/10/2012), Chicken pox (as child), Coronary artery calcification (11/23/2019), Dysphagia (09/11/2021), Elevated LFTs  (11/2017), Essential hypertension, ETD (Eustachian tube dysfunction), right (01/17/2022), Generalized anxiety disorder, GERD (gastroesophageal reflux disease), [place] measles (as a child), Hemoptysis (01/17/2022), Hyperglycemia (04/25/2020), Hyperlipidemia, Insomnia (02/06/2021), Internal hemorrhoids, Irregular cardiac rhythm (03/10/2018), Lipoma (08/04/2018), Macular degeneration, Major depressive disorder (08/01/2012), 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   Surgical History:   Past Surgical History:  Procedure Laterality Date   BELPHAROPTOSIS REPAIR     very young, b/l   EXCISIONAL HEMORRHOIDECTOMY     EYE SURGERY  2017   eyelids   HYDROCELE EXCISION / REPAIR  2012   b/l   SKIN CANCER EXCISION     nose and forehead, basal cell CA   UPPER GASTROINTESTINAL ENDOSCOPY     VENTRICULOPERITONEAL SHUNT Right 03/29/2024   Procedure: SHUNT INSERTION VENTRICULAR-PERITONEAL;  Surgeon: Mavis Purchase, MD;  Location: [facility] OR;  Service: Neurosurgery;  Laterality: Right;  RT VP SHUNT PLACEMENT     Social History:   reports that he quit smoking about 45 years ago. His smoking use included cigarettes. He started smoking about 45 years ago. He has a 22.5 pack-year smoking history. He  has never used smokeless tobacco. He reports current alcohol use of about 7.0 standard drinks of alcohol per week. He reports that he does not currently use drugs.   Family History:  His family history includes ADD / ADHD in his mother and son; Alcohol abuse in his maternal uncle, son, and son; Arthritis in his mother; Asthma in his maternal grandfather, maternal uncle, and  mother; Cancer in his maternal grandfather and paternal grandfather; Cancer (age of onset: 66) in his father; Depression in his maternal uncle, mother, and son; Hearing loss in his father and paternal grandfather; Heart disease in his father, mother, and paternal uncle; Hypertension in his father and mother; Other in his mother; Other (age of onset: 62) in his son; Parkinson's disease in his father; Prostate cancer in his maternal grandfather; Prostate cancer (age of onset: 34) in his father; Stroke in his father and maternal grandmother; Varicose Veins in his father; Vision loss in his father and mother. There is no history of Colon cancer, Esophageal cancer, Rectal cancer, or Stomach cancer.   Allergies Allergies  Allergen Reactions   Albuterol  Sulfate Palpitations   Azithromycin Other (See Comments)    Hepatotoxicity   Doxycycline Hyclate Other (See Comments)    Taken with Azithromycin and had Heaptotoxicity    Jennet Epley, MD [place] Pager: See Tracey

## 2024-03-31 NOTE — Progress Notes (Signed)
 OT Cancellation Note  Patient Details Name: CAIN FITZHENRY MRN: [id number] DOB: [birth year]   Cancelled Treatment:    Reason Eval/Treat Not Completed: Other (comment) (Pt scheduled for shunt revision today. Will follow up tomorrow as appropriate.)  Georga Stys,HILLARY 03/31/2024, 6:21 AM Kreg Sink, OT/L   Acute OT Clinical Specialist Acute Rehabilitation Services Pager [phone] Office [phone]

## 2024-03-31 NOTE — Op Note (Signed)
 Brief history: The patient is an 83 year old white male whom I placed a right ventriculoperitoneal shunt on 03/29/2024.  His postoperative scan demonstrated the ventricular catheter was malpositioned.  Discussed the situation with the patient and his wife and recommended revision.  He has decided to proceed with surgery.  Pre-Op diagnosis: Ventriculoperitoneal shunt valve malplacement  Postop diagnosis: Same  Procedure: Ventriculoperitoneal shunt revision (replacement of ventricular catheter)  Surgeon: Dr. Chyrl Budge  Assistant: Duwaine Beck, NP  Anesthesia: General Tracheal  Estimated blood loss: Minimal  Specimens: None  Drains: None  Complications: None  Description of procedure: The patient was brought to the op room by the anesthesia team.  General endotracheal anesthesia was was induced.  A roll was placed on his right shoulder.  His head was turned to the left.  I removed the staples from the scalp incision.  His scalp upper neck and thorax were then prepared with Betadine scrub of Betadine solution.  Sterile drapes were applied.  I then used the scalpel to excise with the patient's fresh scalp incision.  I used the wheat Hydrographic surveyor for exposure.  This exposed the ventricular catheter and the valve.  I used a 15 blade scalpel to ligate the silk suture securing the ventricular catheter to the valve.  I then disconnected the ventricular catheter.  There was free-flowing spinal fluid through the ventricular catheter indicating its patency.  Nonetheless, because of its malposition on the postoperative CT scan, I removed the catheter.  I then cannulated the patient's ventricular system on the first pass with a new ventricular catheter.  I then threaded the ventricular catheter over the stylette a few more centimeters and continue to get brisk spinal fluid.  I then connected the new ventricular catheter to the shunt valve and secured it with a silk tie.  The wound was then irrigated  with solution.  I removed the retractor.  We obtained hemostasis with bipolar cautery.  I reapproximated the patient's galea with interrupted 2-0 Vicryl suture.  I reapproximated the skin with stainless steel staples.  His wound was then coated with bacitracin  ointment.  A sterile dressing was applied.  The drapes were removed.  By report all sponge instrument, and needle counts were correct at the end of this case.

## 2024-03-31 NOTE — Transfer of Care (Signed)
 Immediate Anesthesia Transfer of Care Note  Patient: Walter Brown  Procedure(s) Performed: REVISION, SHUNT, VENTRICULOPERITONEAL (Right: Head)  Patient Location: PACU  Anesthesia Type:General  Level of Consciousness: sleeping, drowsy  Airway & Oxygen  Therapy: Patient Spontanous Breathing and Patient connected to face mask oxygen   Post-op Assessment: Report given to RN and Post -op Vital signs reviewed and stable  Post vital signs: Reviewed and stable  Last Vitals:  Vitals Value Taken Time  BP 142/80 03/31/24 17:53  Temp    Pulse 68 03/31/24 17:54  Resp 15 03/31/24 17:54  SpO2 96 % 03/31/24 17:54  Vitals shown include unfiled device data.  Last Pain:  Vitals:   03/31/24 1553  TempSrc: Oral  PainSc: 0-No pain      Patients Stated Pain Goal: 2 (03/31/24 1553)  Complications: No notable events documented.

## 2024-04-01 ENCOUNTER — Other Ambulatory Visit: Payer: Self-pay

## 2024-04-01 ENCOUNTER — Ambulatory Visit

## 2024-04-01 ENCOUNTER — Inpatient Hospital Stay (HOSPITAL_COMMUNITY)

## 2024-04-01 ENCOUNTER — Other Ambulatory Visit (HOSPITAL_COMMUNITY): Payer: Self-pay

## 2024-04-01 ENCOUNTER — Encounter (HOSPITAL_COMMUNITY): Payer: Self-pay | Admitting: Neurosurgery

## 2024-04-01 DIAGNOSIS — I10 Essential (primary) hypertension: Secondary | ICD-10-CM | POA: Diagnosis not present

## 2024-04-01 DIAGNOSIS — G912 (Idiopathic) normal pressure hydrocephalus: Secondary | ICD-10-CM | POA: Diagnosis not present

## 2024-04-01 DIAGNOSIS — E785 Hyperlipidemia, unspecified: Secondary | ICD-10-CM | POA: Diagnosis not present

## 2024-04-01 DIAGNOSIS — I4891 Unspecified atrial fibrillation: Secondary | ICD-10-CM | POA: Diagnosis not present

## 2024-04-01 LAB — GLUCOSE, CAPILLARY: Glucose-Capillary: 120 mg/dL — ABNORMAL HIGH (ref 70–99)

## 2024-04-01 MED ORDER — HYDROCODONE-ACETAMINOPHEN 5-325 MG PO TABS: 1.0000 | ORAL_TABLET | Freq: Four times a day (QID) | ORAL | 0 refills | Status: AC | PRN

## 2024-04-01 MED ORDER — HYDROCODONE-ACETAMINOPHEN 5-325 MG PO TABS: 1.0000 | ORAL_TABLET | Freq: Four times a day (QID) | ORAL | 0 refills | Status: DC | PRN

## 2024-04-01 MED ORDER — POLYETHYLENE GLYCOL 3350 17 G PO PACK: 17.0000 g | PACK | Freq: Every day | ORAL | 0 refills | Status: AC | PRN

## 2024-04-01 NOTE — Discharge Instructions (Signed)
 Wound Care Keep incision covered and dry until post op day 3. You may remove the Honeycomb dressing on post op day 3. Staples will be removed at your first post operative appointment. Do not put any creams, lotions, or ointments on incision. You are fine to shower. Let water run over incision and pat dry.  Activity Walk each and every day, increasing distance each day. No lifting greater than 5 lbs.  Avoid excessive back motion. Driving will be discussed at your post op appointment; may ride as a passenger locally.  Diet Resume your normal diet.  Call Your Doctor If Any of These Occur Redness, drainage, or swelling at the wound.  Temperature greater than 101 degrees. Severe pain not relieved by pain medication. Incision starts to come apart.  Follow Up Appt Call [phone] today for appointment in 2-3 weeks if you don't already have one or for any problems.

## 2024-04-01 NOTE — Progress Notes (Signed)
 NAME:  Walter Brown, MRN:  [id number], DOB:  07-01-1941, LOS: 3 ADMISSION DATE:  03/29/2024, CONSULTATION DATE:  03/29/24 REFERRING MD:  Dr Mavis, CHIEF COMPLAINT:  s/p VPS   History of Present Illness:  83 yo M with Afib not on AC, Parkinsons, NPH presented for VPS 6/23. Case unremarkable. There were no progressive beds post operatively so is admitted to the ICU   PCCM is called in this setting   Pertinent  Medical History   Past Medical History:  Diagnosis Date   A-fib [facility])    Allergy    Anemia    mild   Arthritis 04/06/2017   Asthma    childhood   ASVD (arteriosclerotic vascular disease) 09/07/2018   Back pain with radiation 04/26/2013   Low back with LLE radiculopathy   Basal cell carcinoma    skin- on nose- basal cell (20 yrs ago) forehead 1 year ago     Sees Dr Darice Reusing of dermatology   BPH (benign prostatic hyperplasia)    Bradycardia 06/28/2018   Cataract 10/07/2014   Cerumen impaction 08/10/2012   Chicken pox as child   Coronary artery calcification 11/23/2019   Dysphagia 09/11/2021   Elevated LFTs 11/2017   suspected drug/antibiotic induced   Essential hypertension    ETD (Eustachian tube dysfunction), right 01/17/2022   Generalized anxiety disorder    GERD (gastroesophageal reflux disease)    [place] measles as a child   Hemoptysis 01/17/2022   Hyperglycemia 04/25/2020   Hyperlipidemia    Insomnia 02/06/2021   Internal hemorrhoids    Irregular cardiac rhythm 03/10/2018   Lipoma 08/04/2018   Macular degeneration    Major depressive disorder 08/01/2012   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   Nocturia 12/04/2022   Obstructive sleep apnea 11/09/2018   Patient reports mild symptoms; was not prescribed a CPAP machine   Otitis externa 08/10/2012   PAF (paroxysmal atrial fibrillation)    Palpitations 09/07/2018   Parkinson's disease (HCC) 05/19/2019   Recurrent falls 12/04/2022   Thrombocytopenia    TIA (transient  ischemic attack) 03/10/2018   Unsteady gait 09/11/2021   Significant Hospital Events: Including procedures, antibiotic start and stop dates in addition to other pertinent events   6/23-ventriculoperitoneal shunt placed 6/25-repositioning of his VP shunt   Interim History / Subjective:  Awake alert interactive Not in distress Uneventful overnight  Objective    Blood pressure (!) 146/75, pulse 63, temperature (!) 97.5 F (36.4 C), temperature source Oral, resp. rate 19, height 5' 5 (1.651 m), weight 72.6 kg, SpO2 93%.        Intake/Output Summary (Last 24 hours) at 04/01/2024 0918 Last data filed at 04/01/2024 0700 Gross per 24 hour  Intake 1729.59 ml  Output 1860 ml  Net -130.41 ml   Filed Weights   03/29/24 1233  Weight: 72.6 kg    Examination: General: Elderly, does appear comfortable HENT: Moist oral mucosa, surgical site dressings Lungs: Clear breath sounds S1-S2 appreciated Cardiovascular: S1-S2 appreciated Abdomen: Bowel sounds appreciated Extremities: No clubbing, no edema Neuro: Awake alert oriented to person, moving all extremities GU:   Resolved problem list   Assessment and Plan   Normal pressure hydrocephalus s/p VP shunt placement - Had redo of his VP shunt 6/25 - Tolerated procedure well - Continue neuromonitoring - Possibility of being discharged today  Atrial fibrillation Hypertension Hyperlipidemia - Not on anticoagulation - Resumed his home meds  BPH - Home meds renewed  History of Parkinson's disease  Depakote , carbidopa /levodopa  - Delirium precautions  For GERD - PPI  Jennet Epley, MD [place] [facility] Pager: See Tracey

## 2024-04-01 NOTE — Anesthesia Postprocedure Evaluation (Signed)
 Anesthesia Post Note  Patient: Walter Brown  Procedure(s) Performed: REVISION, SHUNT, VENTRICULOPERITONEAL (Right: Head)     Patient location during evaluation: PACU Anesthesia Type: General Level of consciousness: awake and alert and lethargic Pain management: pain level controlled Vital Signs Assessment: post-procedure vital signs reviewed and stable Respiratory status: spontaneous breathing, nonlabored ventilation, respiratory function stable and patient connected to nasal cannula oxygen  Cardiovascular status: blood pressure returned to baseline and stable Postop Assessment: no apparent nausea or vomiting Anesthetic complications: no   No notable events documented.  Last Vitals:  Vitals:   04/01/24 0900 04/01/24 1200  BP: (!) 149/94   Pulse: 66   Resp: 19   Temp:  36.5 C  SpO2: 90%     Last Pain:  Vitals:   04/01/24 1200  TempSrc: Oral  PainSc:                  Lynwood MARLA Cornea

## 2024-04-01 NOTE — TOC Transition Note (Signed)
 Transition of Care (TOC) - Discharge Note Rayfield Gobble RN,BSN Transitions of Care Unit 4NP (Non Trauma)- RN Case Manager See Treatment Team for direct Phone #   Patient Details  Name: Walter Brown MRN: [id number] Date of Birth: 1941/05/23  Transition of Care [facility]) CM/SW Contact:  Gobble Rayfield Hurst, RN Phone Number: 04/01/2024, 2:24 PM   Clinical Narrative:    Pt stable for transition home, PT/OT recommending HH f/u. Note per previous CM note- pt active with [facility].   Request for [facility] sent to PA w/ Neuro.   CM notified [facility] liaison for resumption of HH, Hedda will follow up with pt.   Family to transport home.    Final next level of care: Home w Home Health Services Barriers to Discharge: Barriers Resolved   Patient Goals and CMS Choice Patient states their goals for this hospitalization and ongoing recovery are:: return home   Choice offered to / list presented to : Patient      Discharge Placement               Home w/ [facility]        Discharge Plan and Services Additional resources added to the After Visit Summary for     Discharge Planning Services: CM Consult Post Acute Care Choice: Home Health, Resumption of Svcs/PTA Provider          DME Arranged: N/A DME Agency: NA       HH Arranged: PT, OT HH Agency: [facility] Date [facility] Agency Contacted: 03/31/24 Time HH Agency Contacted: [phone] Representative spoke with at [facility] Agency: Confirmed w Darleene patient is active for [facility] PT prior to admission  Social Drivers of Health (SDOH) Interventions SDOH Screenings   Food Insecurity: No Food Insecurity (03/29/2024)  Housing: Low Risk  (03/29/2024)  Transportation Needs: No Transportation Needs (03/29/2024)  Utilities: Not At Risk (03/29/2024)  Alcohol Screen: Low Risk  (04/01/2023)  Depression (PHQ2-9): Low Risk  (03/16/2024)  Financial Resource Strain: Low Risk  (03/26/2022)  Physical Activity: Sufficiently Active (03/26/2022)  Social Connections:  Moderately Isolated (03/29/2024)  Stress: Stress Concern Present (03/26/2022)  Tobacco Use: Medium Risk (03/31/2024)     Readmission Risk Interventions    04/01/2024    2:24 PM  Readmission Risk Prevention Plan  Transportation Screening Complete  Home Care Screening Complete  Medication Review (RN CM) Complete

## 2024-04-01 NOTE — Progress Notes (Signed)
 Physical Therapy Treatment Patient Details Name: Walter Brown MRN: [id number] DOB: Feb 17, 1941 Today's Date: 04/01/2024   History of Present Illness Patient is an 83 y/o male admitted 03/29/24 due to symptoms of NPH with MRI suggestive of ventriculomegaly and he underwent VP shunt placement. S/p ventriculoperitoneal shunt revision 6/25. PMH: AMD, Parkinson's disease, normal pressure hydrocephalus, recurrent falls, paroxysmal atrial fibrillation, GERD, macular degeneration, OSA, and recent admission 02/2024 due to UTI. Shunt revision completed 6/25.    PT Comments  Focused session on transfer training in preparation for d/c home. The pt tends to lean posteriorly and thereby stagger posteriorly with transfers and standing mobility, placing him at risk for falls. Educated pt on shifting his weight more anteriorly, which he was able to successfully do during sit to stand transfers, resulting in him progressing from needing minA to only needing CGA with subsequent reps. He still required minA for step pivot transfers though. Educated pt and his wife on his risk for falls and recs to have someone assist him with all transfers/standing mobility at d/c. They verbalized understanding. Will continue to follow acutely.     If plan is discharge home, recommend the following: A little help with walking and/or transfers;A little help with bathing/dressing/bathroom;Supervision due to cognitive status;Assistance with cooking/housework;Assist for transportation;Help with stairs or ramp for entrance   Can travel by private vehicle        Equipment Recommendations  None recommended by PT    Recommendations for Other Services       Precautions / Restrictions Precautions Precautions: Fall Recall of Precautions/Restrictions: Intact Precaution/Restrictions Comments: parkinsons, AMD L eye, L lateral lean Restrictions Weight Bearing Restrictions Per Provider Order: No     Mobility  Bed Mobility Overal bed  mobility: Needs Assistance Bed Mobility: Sit to Supine       Sit to supine: Mod assist   General bed mobility comments: ModA at legs to lift and pivot onto bed with return to supine from sitting L EOB.    Transfers Overall transfer level: Needs assistance Equipment used: Rolling walker (2 wheels) Transfers: Sit to/from Stand, Bed to chair/wheelchair/BSC Sit to Stand: Min assist, Contact guard assist   Step pivot transfers: Min assist       General transfer comment: Pt kept bil hands on RW with sit <> stand transfers. Pt tends to lean posteriorly, resulting in him needing minA to shift his weight anteriorly during transfers. However, with cuing to bring nose over toes and place weight through his toes and not so much through his heels, he did progress to transferring to stand from sit with just CGA. However, he required minA for all step pivot transfers due to posterior stagger intermittently. Pt performed >10 sit <> stand reps and x4 step pivot reps with RW support    Ambulation/Gait Ambulation/Gait assistance: Min assist Gait Distance (Feet): 3 Feet (x4 bouts of ~3 ft each bout) Assistive device: Rolling walker (2 wheels) Gait Pattern/deviations: Step-to pattern, Decreased step length - right, Decreased step length - left, Decreased stride length, Trunk flexed, Shuffle, Leaning posteriorly Gait velocity: reduced Gait velocity interpretation: <1.31 ft/sec, indicative of household ambulator   General Gait Details: Pt takes slow, shuffling steps with a flexed posture and a tendency to stagger posteriorly, needing minA for recovery and balance. Cues provided to shift weight more anteriorly.   Stairs             Wheelchair Mobility     Tilt Bed    Modified Rankin (Stroke Patients Only)  Balance Overall balance assessment: History of Falls, Needs assistance Sitting-balance support: No upper extremity supported, Feet supported Sitting balance-Leahy Scale:  Fair   Postural control: Posterior lean Standing balance support: Bilateral upper extremity supported, Reliant on assistive device for balance, During functional activity Standing balance-Leahy Scale: Poor Standing balance comment: reliant on up to minA to prevent posterior LOB as pt tends to stagger and lean posteriorly, uses RW for support                            Communication Communication Communication: Impaired Factors Affecting Communication: Hearing impaired  Cognition Arousal: Alert Behavior During Therapy: WFL for tasks assessed/performed   PT - Cognitive impairments: History of cognitive impairments, Problem solving                       PT - Cognition Comments: slow processing with decreased insight into deficits and safety at times Following commands: Impaired Following commands impaired: Only follows one step commands consistently, Follows one step commands with increased time    Cueing Cueing Techniques: Verbal cues  Exercises      General Comments General comments (skin integrity, edema, etc.): VSS on RA; educated pt and wife on his risk for falls and recs for someone to assist him with gait belt (has one already) with all standing mobility, they verbalized understanding      Pertinent Vitals/Pain Pain Assessment Pain Assessment: Faces Faces Pain Scale: Hurts a little bit Pain Location: buttocks Pain Descriptors / Indicators: Sore Pain Intervention(s): Limited activity within patient's tolerance, Monitored during session, Repositioned, Other (comment) (applied sacral foam (no wound noted))    Home Living Family/patient expects to be discharged to:: Private residence Living Arrangements: Spouse/significant other Available Help at Discharge: Personal care attendant Type of Home: House Home Access: Ramped entrance       Home Layout: One level Home Equipment: Shower seat;Electric scooter;Grab bars - tub/shower;Grab bars - toilet;Hand  held shower head;Lift chair;Rollator (4 wheels) (adjustable bed) Additional Comments: aide 7-8 hours 5 days a week    Prior Function            PT Goals (current goals can now be found in the care plan section) Acute Rehab PT Goals Patient Stated Goal: to go home PT Goal Formulation: With patient/family Time For Goal Achievement: 04/13/24 Potential to Achieve Goals: Good Progress towards PT goals: Progressing toward goals    Frequency    Min 2X/week      PT Plan      Co-evaluation              AM-PAC PT 6 Clicks Mobility   Outcome Measure  Help needed turning from your back to your side while in a flat bed without using bedrails?: A Little Help needed moving from lying on your back to sitting on the side of a flat bed without using bedrails?: A Lot Help needed moving to and from a bed to a chair (including a wheelchair)?: A Little Help needed standing up from a chair using your arms (e.g., wheelchair or bedside chair)?: A Little Help needed to walk in hospital room?: Total Help needed climbing 3-5 steps with a railing? : Total 6 Click Score: 13    End of Session Equipment Utilized During Treatment: Gait belt Activity Tolerance: Patient tolerated treatment well Patient left: in bed;with call bell/phone within reach;with bed alarm set;with family/visitor present Nurse Communication: Mobility status PT Visit Diagnosis: Other  abnormalities of gait and mobility (R26.89);Other symptoms and signs involving the nervous system (R29.898);History of falling (Z91.81);Unsteadiness on feet (R26.81)     Time: 8646-8577 PT Time Calculation (min) (ACUTE ONLY): 29 min  Charges:    $Therapeutic Activity: 23-37 mins PT General Charges $$ ACUTE PT VISIT: 1 Visit                     Theo Ferretti, PT, DPT Acute Rehabilitation Services  Office: [phone]    Theo CHRISTELLA Ferretti 04/01/2024, 3:10 PM

## 2024-04-01 NOTE — Evaluation (Signed)
 Occupational Therapy Evaluation Patient Details Name: Walter Brown MRN: [id number] DOB: October 28, 1940 Today's Date: 04/01/2024   History of Present Illness   Patient is an 83 y/o male admitted 03/29/24 due to symptoms of NPH with MRI suggestive of ventriculomegaly and he underwent VP shunt placement.  PMH: AMD, Parkinson's disease, normal pressure hydrocephalus, recurrent falls, paroxysmal atrial fibrillation, GERD, macular degeneration, OSA, and recent admission 02/2024 due to UTI. Shunt revision completed 6/25.     Clinical Impressions Pt admitted with the above diagnosis. Pt currently with functional limitations due to the deficits listed below (see OT Problem List). Prior to admit, pt was living at home with spouse with assist from personal care attendant. At baseline, pt reports that he receives assistance PRN for bathing and dressing. He either walks with his rolator or uses electric scooter for mobility. Pt will benefit from acute skilled OT to increase their safety and independence with ADL and functional mobility for ADL to facilitate discharge. Recommend HHOT services at discharge and use of BSC as pt reports that he is unable to fit into his bathroom with his current AD.       If plan is discharge home, recommend the following:   A little help with walking and/or transfers;A lot of help with bathing/dressing/bathroom;Help with stairs or ramp for entrance;Assistance with cooking/housework;Assist for transportation     Functional Status Assessment   Patient has had a recent decline in their functional status and demonstrates the ability to make significant improvements in function in a reasonable and predictable amount of time.     Equipment Recommendations   BSC/3in1      Precautions/Restrictions   Precautions Precautions: Fall Recall of Precautions/Restrictions: Intact Precaution/Restrictions Comments: parkinsons, AMD L eye, L lateral lean Restrictions Weight Bearing  Restrictions Per Provider Order: No     Mobility Bed Mobility Overal bed mobility: Needs Assistance Bed Mobility: Supine to Sit     Supine to sit: Min assist, HOB elevated, Used rails     General bed mobility comments: VC and visual cues provided for sequencing and technique. Required some assistance to bring trunk up off HOB.    Transfers Overall transfer level: Needs assistance Equipment used: Rolling walker (2 wheels) Transfers: Sit to/from Stand, Bed to chair/wheelchair/BSC Sit to Stand: Min assist, From elevated surface     Step pivot transfers: Min assist     General transfer comment: VC for hand placement during RW management prior to sit to stand transition. Pt was able to tolerate static standing for 1-2 minutes at EOB holding RW for external support.      Balance Overall balance assessment: History of Falls, Mild deficits observed, not formally tested Sitting-balance support: No upper extremity supported, Feet supported Sitting balance-Leahy Scale: Fair   Postural control: Left lateral lean (seen when seated in recliner only) Standing balance support: Bilateral upper extremity supported, During functional activity, Reliant on assistive device for balance Standing balance-Leahy Scale: Poor     ADL either performed or assessed with clinical judgement   ADL Overall ADL's : Needs assistance/impaired     Grooming: Wash/dry face;Oral care;Set up;Sitting   Upper Body Bathing: Set up;Sitting   Lower Body Bathing: Maximal assistance;Sit to/from stand   Upper Body Dressing : Set up;Sitting   Lower Body Dressing: Total assistance;Sit to/from stand   Toilet Transfer: Minimal assistance;Rolling walker (2 wheels);Stand-pivot   Toileting- Architect and Hygiene: Total assistance;Sit to/from stand        Vision Baseline Vision/History: 6 Macular Degeneration Ability  to See in Adequate Light: 0 Adequate Patient Visual Report: No change from  baseline Vision Assessment?: No apparent visual deficits     Perception Perception: Not tested       Praxis Praxis: Not tested       Pertinent Vitals/Pain Pain Assessment Pain Assessment: No/denies pain     Extremity/Trunk Assessment Upper Extremity Assessment Upper Extremity Assessment: Generalized weakness   Lower Extremity Assessment Lower Extremity Assessment: Generalized weakness   Cervical / Trunk Assessment Cervical / Trunk Assessment: Kyphotic   Communication Communication Communication: Impaired Factors Affecting Communication: Hearing impaired   Cognition Arousal: Alert Behavior During Therapy: WFL for tasks assessed/performed Cognition: No apparent impairments     Following commands: Intact Following commands impaired: Only follows one step commands consistently     Cueing  General Comments   Cueing Techniques: Verbal cues  VSS on RA.           Home Living Family/patient expects to be discharged to:: Private residence Living Arrangements: Spouse/significant other Available Help at Discharge: Personal care attendant Type of Home: House Home Access: Ramped entrance     Home Layout: One level     Bathroom Shower/Tub: Producer, television/film/video: Handicapped height     Home Equipment: Adult nurse scooter;Grab bars - tub/shower;Grab bars - toilet;Hand held Orthoptist (4 wheels) (adjustable bed)   Additional Comments: aide 7-8 hours 5 days a week      Prior Functioning/Environment Prior Level of Function : Needs assist       Physical Assist : ADLs (physical)     Mobility Comments: does transfers to scooter usually on his own, one fall in bathroom in past 6 months; had therapists from [place]; has rails on his bed and pole beside the bed to get to scooter ADLs Comments: has help sometimes to shower and dress    OT Problem List: Decreased strength;Impaired balance (sitting and/or standing)   OT  Treatment/Interventions: Self-care/ADL training;Therapeutic exercise;Therapeutic activities;Neuromuscular education;DME and/or AE instruction;Patient/family education;Manual therapy;Balance training      OT Goals(Current goals can be found in the care plan section)   Acute Rehab OT Goals Patient Stated Goal: for the shunt to work OT Goal Formulation: With patient Time For Goal Achievement: 04/15/24 Potential to Achieve Goals: Good   OT Frequency:  Min 2X/week       AM-PAC OT 6 Clicks Daily Activity     Outcome Measure Help from another person eating meals?: A Little Help from another person taking care of personal grooming?: A Little Help from another person toileting, which includes using toliet, bedpan, or urinal?: Total Help from another person bathing (including washing, rinsing, drying)?: A Lot Help from another person to put on and taking off regular upper body clothing?: A Little Help from another person to put on and taking off regular lower body clothing?: Total 6 Click Score: 13   End of Session Equipment Utilized During Treatment: Gait belt;Rolling walker (2 wheels) Nurse Communication: Mobility status  Activity Tolerance: Patient tolerated treatment well Patient left: in chair;with call bell/phone within reach;with chair alarm set  OT Visit Diagnosis: History of falling (Z91.81);Muscle weakness (generalized) (M62.81)                Time: 8978-8894 OT Time Calculation (min): 44 min Charges:  OT General Charges $OT Visit: 1 Visit OT Evaluation $OT Eval High Complexity: 1 High OT Treatments $Self Care/Home Management : 23-37 mins  [employer], OTR/L,CBIS  Supplemental OT - [facility] and [employer]  Secure Chat Preferred    [place] Hert, Leita BIRCH 04/01/2024, 11:24 AM

## 2024-04-01 NOTE — Discharge Summary (Signed)
 Physician Discharge Summary     Providing Compassionate, Quality Care - Together   Patient ID: Walter Brown MRN: [id number] DOB/AGE: 1940/10/10 83 y.o.  Admit date: 03/29/2024 Discharge date: 04/01/2024  Admission Diagnoses: Normal Pressure Hydrocephalus  Discharge Diagnoses:  Principal Problem:   (Idiopathic) normal pressure hydrocephalus (HCC) Active Problems:   Normal pressure hydrocephalus [facility])   Discharged Condition: good  Hospital Course: Patient underwent placement of a VP shunt by Dr. Mavis on 03/29/2024. He was admitted to the ICU following recovery from anesthesia in the PACU. He returned to the OR for proximal shunt revision by Dr. Mavis on 03/31/2024. He has worked with both physical and occupational therapies who feel the patient is ready for discharge home with home health therapies. He is ambulating with assistance. He is tolerating a normal diet. He is not having any bowel or bladder dysfunction. His pain is well-controlled with oral pain medication. He is ready for discharge home with home health therapies.   Consults: PT/OT/TOC  Significant Diagnostic Studies: radiology: CT HEAD WO CONTRAST Result Date: 04/01/2024 CLINICAL DATA:  Provided history: Hydrocephalus. EXAM: CT HEAD WITHOUT CONTRAST TECHNIQUE: Contiguous axial images were obtained from the base of the skull through the vertex without intravenous contrast. RADIATION DOSE REDUCTION: This exam was performed according to the departmental dose-optimization program which includes automated exposure control, adjustment of the mA and/or kV according to patient size and/or use of iterative reconstruction technique. COMPARISON:  Head CT 03/30/2024. FINDINGS: Brain: Generalized cerebral atrophy. Interval repositioning of a right parietal approach ventricular shunt catheter which now crosses midline and terminates in the body of the left lateral ventricle. Unchanged size and configuration of the ventricular system.  Trace postoperative hemorrhage along the catheter tract, similar to the prior exam. Small-volume postoperative pneumocephalus scattered along the right cerebral hemisphere. 7 mm extra-axial mass overlying the anterior left frontal lobe, unchanged most consistent with a meningioma (series 2, image 22). No demarcated cortical infarct. No midline shift. Vascular: No hyperdense vessel.  Atherosclerotic calcifications. Skull: Right parietal burr hole with traversing ventricular shunt catheter. Sinuses/Orbits: No mass or acute finding within the imaged orbits. No significant paranasal sinus disease. Other: Right parietal scalp staples. IMPRESSION: 1. Interval repositioning of a right parietal approach ventricular shunt catheter which now terminates in the body of the left lateral ventricle. Unchanged size and configuration of the ventricular system. Trace postoperative hemorrhage along the catheter tract, similar to the prior exam. Small-volume postoperative pneumocephalus. 2. Redemonstrated small meningioma overlying the anterior left frontal lobe. Electronically Signed   By: [name]   On: 04/01/2024 08:27   CT HEAD WO CONTRAST Result Date: 03/30/2024 CLINICAL DATA:  83 year old male with recent delirium and nonspecific ventriculomegaly. EXAM: CT HEAD WITHOUT CONTRAST TECHNIQUE: Contiguous axial images were obtained from the base of the skull through the vertex without intravenous contrast. RADIATION DOSE REDUCTION: This exam was performed according to the departmental dose-optimization program which includes automated exposure control, adjustment of the mA and/or kV according to patient size and/or use of iterative reconstruction technique. COMPARISON:  Head CT 03/02/2024 the and earlier, including 07/02/2012. FINDINGS: Brain: New right posterolateral approach ventriculostomy. The catheter does not appear to communicate with the ventricular system (series 3, image 17 and coronal image 36. Small volume  postoperative pneumocephalus. Unchanged ventricle size and configuration. No pneumo ventricle identified. No superimposed midline shift, mass effect, evidence of mass lesion, intracranial hemorrhage or evidence of cortically based acute infarction. Stable gray-white matter differentiation throughout the brain. Vascular: No suspicious  intracranial vascular hyperdensity. Skull: New right posterior lateral burr hole. Otherwise stable and intact. Sinuses/Orbits: Visualized paranasal sinuses and mastoids are stable and well aerated. Other: New postoperative changes to the right scalp. Skin staples are in place. Right posterolateral convexity shunt reservoir and tubing with regional mild postoperative soft tissue gas. Visualized orbit soft tissues are within normal limits. IMPRESSION: 1. New right posterolateral approach ventriculostomy. The catheter does Not appear to communicate with the ventricular system. Small volume postoperative pneumocephalus. 2. No other acute intracranial abnormality. Unchanged ventricle size and configuration. [name] Progress Note on this patient from 0742 hours today indicates he is aware of #1. Electronically Signed   By: [name]   On: 03/30/2024 14:28     Treatments: surgery:   03/29/2024: Placement of right retroauricular ventriculoperitoneal shunt (Certas 2 programmable shunt set at 4)   03/31/2024: Ventriculoperitoneal shunt revision (replacement of ventricular catheter)  Discharge Exam: Blood pressure (!) 149/94, pulse 66, temperature 97.7 F (36.5 C), temperature source Oral, resp. rate 19, height 5' 5 (1.651 m), weight 72.6 kg, SpO2 90%.  Alert and oriented x 4 PERRLA CN II-XII grossly intact MAE, Strength and sensation at baseline Incision is covered with Honeycomb dressing; Dressing is clean, dry, and intact   Disposition: Discharge disposition: 01-Home or Self Care       Discharge Instructions     Call MD for:  difficulty breathing,  headache or visual disturbances   Complete by: As directed    Call MD for:  extreme fatigue   Complete by: As directed    Call MD for:  hives   Complete by: As directed    Call MD for:  persistant dizziness or light-headedness   Complete by: As directed    Call MD for:  persistant nausea and vomiting   Complete by: As directed    Call MD for:  redness, tenderness, or signs of infection (pain, swelling, redness, odor or green/yellow discharge around incision site)   Complete by: As directed    Call MD for:  severe uncontrolled pain   Complete by: As directed    Diet - low sodium heart healthy   Complete by: As directed    Face-to-face encounter (required for Medicare/Medicaid patients)   Complete by: As directed    I Gerard JONETTA Beck certify that this patient is under my care and that I, or a nurse practitioner or physician's assistant working with me, had a face-to-face encounter that meets the physician face-to-face encounter requirements with this patient on 04/01/2024. The encounter with the patient was in whole, or in part for the following medical condition(s) which is the primary reason for home health care (List medical condition): Normal Pressure Hydrocephalus   The encounter with the patient was in whole, or in part, for the following medical condition, which is the primary reason for home health care: Normal Pressure Hydrocephalus   I certify that, based on my findings, the following services are medically necessary home health services: Physical therapy   Reason for Medically Necessary Home Health Services: Therapy- Therapeutic Exercises to Increase Strength and Endurance   My clinical findings support the need for the above services: Unable to leave home safely without assistance and/or assistive device   Further, I certify that my clinical findings support that this patient is homebound due to: Unsafe ambulation due to balance issues   Home Health   Complete by: As directed     To provide the following care/treatments:  PT  OT     If the dressing is still on your incision site when you go home, remove it on the third day after your surgery date. Remove dressing if it begins to fall off, or if it is dirty or damaged before the third day.   Complete by: As directed    Increase activity slowly   Complete by: As directed       Allergies as of 04/01/2024       Reactions   Albuterol  Sulfate Palpitations   Azithromycin Other (See Comments)   Hepatotoxicity   Doxycycline Hyclate Other (See Comments)   Taken with Azithromycin and had Heaptotoxicity        Medication List     TAKE these medications    acetaminophen  500 MG tablet Commonly known as: TYLENOL  Take 2 tablets (1,000 mg total) by mouth every 6 (six) hours as needed for moderate pain (pain score 4-6).   amiodarone  200 MG tablet Commonly known as: PACERONE  TAKE 1/2 TABLET BY MOUTH DAILY   benzonatate  100 MG capsule Commonly known as: TESSALON  Take 1-2 capsules (100-200 mg total) by mouth 2 (two) times daily as needed for cough.   budesonide -formoterol  160-4.5 MCG/ACT inhaler Commonly known as: SYMBICORT  Inhale 2 puffs into the lungs 2 (two) times daily.   carbidopa -levodopa  50-200 MG tablet Commonly known as: SINEMET  CR TAKE 1 TABLET BY MOUTH EVERYDAY AT BEDTIME   carbidopa -levodopa  25-100 MG tablet Commonly known as: SINEMET  IR 2 AT 7AM, 2 AT 11AM, Take  1 AT 3PM, Take 1 at 7 PM   cholecalciferol 25 MCG (1000 UNIT) tablet Commonly known as: VITAMIN D3 Take 2,000 Units by mouth daily.   divalproex  125 MG DR tablet Commonly known as: DEPAKOTE  TAKE 1 TABLET (125 MG TOTAL) BY MOUTH AT BEDTIME   ezetimibe  10 MG tablet Commonly known as: ZETIA  TAKE 1 TABLET BY MOUTH EVERY DAY   guaifenesin  100 MG/5ML syrup Commonly known as: ROBITUSSIN Take 10 mLs (200 mg total) by mouth 3 (three) times daily as needed for cough.   hydrochlorothiazide  25 MG tablet Commonly known as:  HYDRODIURIL  TAKE 1 TABLET BY MOUTH EVERY DAY   HYDROcodone -acetaminophen  5-325 MG tablet Commonly known as: NORCO/VICODIN Take 1 tablet by mouth every 6 (six) hours as needed for up to 5 days for moderate pain (pain score 4-6).   ICAPS AREDS 2 PO Take 1 capsule by mouth in the morning and at bedtime.   levalbuterol  45 MCG/ACT inhaler Commonly known as: XOPENEX  HFA INHALE 1 TO 2 PUFFS BY MOUTH EVERY 6 HOURS AS NEEDED FOR WHEEZE   Melatonin 10 MG Tabs Take 10 mg by mouth daily as needed.   Myrbetriq 50 MG Tb24 tablet Generic drug: mirabegron ER Take 50 mg by mouth daily.   pantoprazole  40 MG tablet Commonly known as: PROTONIX  Take 1 tablet (40 mg total) by mouth daily as needed.   polyethylene glycol 17 g packet Commonly known as: MIRALAX / GLYCOLAX Take 17 g by mouth daily as needed for mild constipation.   senna 8.6 MG Tabs tablet Commonly known as: SENOKOT Take 1 tablet by mouth in the morning and at bedtime.   sertraline  100 MG tablet Commonly known as: ZOLOFT  TAKE 1 TABLET BY MOUTH EVERY DAY   solifenacin 5 MG tablet Commonly known as: VESICARE Take 5 mg by mouth daily.   Vitamin D  (Ergocalciferol ) 1.25 MG (50000 UNIT) Caps capsule Commonly known as: DRISDOL  Take 1 capsule (50,000 Units total) by mouth every 7 (seven) days.  Discharge Care Instructions  (From admission, onward)           Start     Ordered   04/01/24 0000  If the dressing is still on your incision site when you go home, remove it on the third day after your surgery date. Remove dressing if it begins to fall off, or if it is dirty or damaged before the third day.        04/01/24 1416            Follow-up Information     Mavis Purchase, MD. Go on 04/20/2024.   Specialty: Neurosurgery Why: First post op appointment is on 04/20/2024 at 8:30 AM. Contact information: [address] [place] [address] [phone]         Care, [facility] Follow up.   Specialty: Home Health Services Why: [facility] will resume on discharge (PT/OT) they will contact you to schedule Contact information: [address] [place] [address] [phone]                 Signed: Gerard Beck, DNP, AGNP-C Nurse Practitioner  [facility] [address], [address], [place], [place] [address] P: [phone]    F: [phone]  04/01/2024, 2:40 PM

## 2024-04-02 ENCOUNTER — Telehealth: Payer: Self-pay

## 2024-04-02 NOTE — Transitions of Care (Post Inpatient/ED Visit) (Signed)
   04/02/2024  Name: DEVINE KLINGEL MRN: [id number] DOB: 10-16-1940  Today's TOC FU Call Status: Today's TOC FU Call Status:: Unsuccessful Call (1st Attempt) Unsuccessful Call (1st Attempt) Date: 04/02/24  Attempted to reach the patient regarding the most recent Inpatient/ED visit.  Follow Up Plan: Additional outreach attempts will be made to reach the patient to complete the Transitions of Care (Post Inpatient/ED visit) call.   Medford Balboa, BSN, RN [place]  VBCI - [employer] [facility] RN Care Manager [phone]

## 2024-04-05 ENCOUNTER — Telehealth: Payer: Self-pay

## 2024-04-05 DIAGNOSIS — Z7951 Long term (current) use of inhaled steroids: Secondary | ICD-10-CM | POA: Diagnosis not present

## 2024-04-05 DIAGNOSIS — J449 Chronic obstructive pulmonary disease, unspecified: Secondary | ICD-10-CM | POA: Diagnosis not present

## 2024-04-05 DIAGNOSIS — Z87440 Personal history of urinary (tract) infections: Secondary | ICD-10-CM | POA: Diagnosis not present

## 2024-04-05 DIAGNOSIS — I480 Paroxysmal atrial fibrillation: Secondary | ICD-10-CM | POA: Diagnosis not present

## 2024-04-05 DIAGNOSIS — G912 (Idiopathic) normal pressure hydrocephalus: Secondary | ICD-10-CM | POA: Diagnosis not present

## 2024-04-05 DIAGNOSIS — D696 Thrombocytopenia, unspecified: Secondary | ICD-10-CM | POA: Diagnosis not present

## 2024-04-05 DIAGNOSIS — G20A2 Parkinson's disease without dyskinesia, with fluctuations: Secondary | ICD-10-CM | POA: Diagnosis not present

## 2024-04-05 DIAGNOSIS — I10 Essential (primary) hypertension: Secondary | ICD-10-CM | POA: Diagnosis not present

## 2024-04-05 DIAGNOSIS — G3184 Mild cognitive impairment, so stated: Secondary | ICD-10-CM | POA: Diagnosis not present

## 2024-04-05 NOTE — Transitions of Care (Post Inpatient/ED Visit) (Signed)
 04/05/2024  Name: Walter Brown MRN: [id number] DOB: [birth year]  Today's TOC FU Call Status: Today's TOC FU Call Status:: Successful TOC FU Call Completed TOC FU Call Complete Date: 04/05/24 Patient's Name and Date of Birth confirmed.  Transition Care Management Follow-up Telephone Call Date of Discharge: 04/01/24 Discharge Facility: [facility]) Type of Discharge: Inpatient Admission Primary Inpatient Discharge Diagnosis:: Placement of VP shunt How have you been since you were released from the hospital?: Better Any questions or concerns?: Yes Patient Questions/Concerns:: Increase in urine output Patient Questions/Concerns Addressed: Other: (Instructed the spouse to call the Neurosugeon to inform)  Items Reviewed: Did you receive and understand the discharge instructions provided?: Yes Medications obtained,verified, and reconciled?: Yes (Medications Reviewed) Any new allergies since your discharge?: No Dietary orders reviewed?: Yes Type of Diet Ordered:: Low sodium heart healthy Do you have support at home?: Yes People in Home [RPT]: spouse Name of Support/Comfort Primary Source: Estefana  Medications Reviewed Today: Medications Reviewed Today     Reviewed by Moises Reusing, RN (Case Manager) on 04/05/24 at 1608  Med List Status: <None>   Medication Order Taking? Sig Documenting Provider Last Dose Status Informant  acetaminophen  (TYLENOL ) 500 MG tablet [id number] Yes Take 2 tablets (1,000 mg total) by mouth every 6 (six) hours as needed for moderate pain (pain score 4-6). Nivia Colon, PA-C  Active Spouse/Significant Other, Care Giver  amiodarone  (PACERONE ) 200 MG tablet [id number] Yes TAKE 1/2 TABLET BY MOUTH DAILY Court Dorn PARAS, MD  Active Spouse/Significant Other, Care Giver  benzonatate  (TESSALON ) 100 MG capsule [id number] Yes Take 1-2 capsules (100-200 mg total) by mouth 2 (two) times daily as needed for cough. Domenica Harlene LABOR, MD  Active   budesonide -formoterol   (SYMBICORT ) 160-4.5 MCG/ACT inhaler [id number] Yes Inhale 2 puffs into the lungs 2 (two) times daily. Paz, Jose E, MD  Active Spouse/Significant Other, Care Giver  carbidopa -levodopa  (SINEMET  CR) 50-200 MG tablet [id number] Yes TAKE 1 TABLET BY MOUTH EVERYDAY AT BEDTIME Tat, Asberry RAMAN, DO  Active Spouse/Significant Other, Care Giver           Med Note (WHITE, DONETA RAMAN   Mon Mar 01, 2024 10:55 PM)    carbidopa -levodopa  (SINEMET  IR) 25-100 MG tablet [id number] Yes 2 AT 7AM, 2 AT 11AM, Take  1 AT 3PM, Take 1 at 7 PM Tat, Rebecca S, DO  Active Spouse/Significant Other, Care Giver  cholecalciferol (VITAMIN D3) 25 MCG (1000 UNIT) tablet [id number] Yes Take 2,000 Units by mouth daily. [provider]  Active Spouse/Significant Other, Care Giver  divalproex  (DEPAKOTE ) 125 MG DR tablet [id number] Yes TAKE 1 TABLET (125 MG TOTAL) BY MOUTH AT BEDTIME Wertman, Sara E, PA-C  Active Spouse/Significant Other, Care Giver  ezetimibe  (ZETIA ) 10 MG tablet [id number] Yes TAKE 1 TABLET BY MOUTH EVERY DAY Walker, Caitlin S, NP  Active Spouse/Significant Other, Care Giver  guaifenesin  (ROBITUSSIN) 100 MG/5ML syrup [id number] Yes Take 10 mLs (200 mg total) by mouth 3 (three) times daily as needed for cough. Paz, Jose E, MD  Active Spouse/Significant Other, Care Giver  hydrochlorothiazide  (HYDRODIURIL ) 25 MG tablet [id number] Yes TAKE 1 TABLET BY MOUTH EVERY DAY Domenica Harlene LABOR, MD  Active Spouse/Significant Other, Care Giver  HYDROcodone -acetaminophen  (NORCO/VICODIN) 5-325 MG tablet [id number] Yes Take 1 tablet by mouth every 6 (six) hours as needed for up to 5 days for moderate pain (pain score 4-6). Bergman, Meghan D, NP  Active   levalbuterol  (XOPENEX  HFA) 45 MCG/ACT inhaler [id number] Yes INHALE 1 TO 2 PUFFS BY  MOUTH EVERY 6 HOURS AS NEEDED FOR WHEEZE Domenica Harlene LABOR, MD  Active Spouse/Significant Other, Care Giver  Melatonin 10 MG TABS [id number] Yes Take 10 mg by mouth daily as needed. [provider]  Active  Spouse/Significant Other, Care Giver  mirabegron  ER (MYRBETRIQ ) 50 MG TB24 tablet [id number] Yes Take 50 mg by mouth daily. [provider]  Active Spouse/Significant Other, Care Giver  Multiple Vitamins-Minerals (ICAPS AREDS 2 PO) [id number] Yes Take 1 capsule by mouth in the morning and at bedtime. [provider]  Active Spouse/Significant Other, Care Giver  pantoprazole  (PROTONIX ) 40 MG tablet [id number] Yes Take 1 tablet (40 mg total) by mouth daily as needed. Daryl Setter, NP  Active Spouse/Significant Other, Care Giver  polyethylene glycol (MIRALAX  / GLYCOLAX ) 17 g packet [id number] Yes Take 17 g by mouth daily as needed for mild constipation. Bergman, Meghan D, NP  Active   senna (SENOKOT) 8.6 MG TABS tablet [id number] Yes Take 1 tablet by mouth in the morning and at bedtime. [provider]  Active Spouse/Significant Other, Care Giver  sertraline  (ZOLOFT ) 100 MG tablet [id number] Yes TAKE 1 TABLET BY MOUTH EVERY DAY Domenica Harlene LABOR, MD  Active Spouse/Significant Other, Care Giver  solifenacin (VESICARE) 5 MG tablet [id number] Yes Take 5 mg by mouth daily. [provider]  Active Spouse/Significant Other, Care Giver  Vitamin D , Ergocalciferol , (DRISDOL ) 1.25 MG (50000 UNIT) CAPS capsule [id number] Yes Take 1 capsule (50,000 Units total) by mouth every 7 (seven) days. Domenica Harlene LABOR, MD  Active             Home Care and Equipment/Supplies: Were Home Health Services Ordered?: Yes Name of Home Health Agency:: [facility] Has Agency set up a time to come to your home?: Yes First Home Health Visit Date: 04/05/24 Any new equipment or medical supplies ordered?: No  Functional Questionnaire: Do you need assistance with bathing/showering or dressing?: Yes (Has private duty caregivers and spouse to assist) Do you need assistance with meal preparation?: Yes (Has private duty caregivers and spouse to assist) Do you need assistance with eating?: No Do you have  difficulty maintaining continence: Yes (Has private duty caregivers and spouse to assist. Wears incontinent products) Do you need assistance with getting out of bed/getting out of a chair/moving?: Yes (Has private duty caregivers and spouse to assist) Do you have difficulty managing or taking your medications?: Yes (Has private duty caregivers and spouse to assist)  Follow up appointments reviewed: PCP Follow-up appointment confirmed?: No (The patient's spouse does not want to move up the August appt) MD Provider Line Number:[phone] Given: No Specialist Hospital Follow-up appointment confirmed?: Yes Date of Specialist follow-up appointment?: 07/01/24 Follow-Up Specialty Provider:: Dr. Evonnie Do you need transportation to your follow-up appointment?: No Do you understand care options if your condition(s) worsen?: Yes-patient verbalized understanding  SDOH Interventions Today    Flowsheet Row Most Recent Value  SDOH Interventions   Food Insecurity Interventions Intervention Not Indicated  Housing Interventions Intervention Not Indicated  Transportation Interventions Intervention Not Indicated  Utilities Interventions Intervention Not Indicated    Medford Balboa, BSN, RN [place]  VBCI - [facility] RN Care Manager [phone]

## 2024-04-06 ENCOUNTER — Encounter: Payer: PPO | Admitting: Psychology

## 2024-04-07 ENCOUNTER — Telehealth: Payer: Self-pay

## 2024-04-07 NOTE — Progress Notes (Signed)
 Complex Care Management Care Guide Note  04/07/2024 Name: Walter Brown MRN: [id number] DOB: 06-30-1941  Walter Brown is a 83 y.o. year old male who is a primary care patient of Domenica Harlene LABOR, MD and is actively engaged with the care management team. I reached out to [name] by phone today to assist with provider appointment scheduling  with the RN Case Manager.  Follow up plan: Telephone appointment with complex care management team member scheduled for:  04/13/24 at 3:30 p.m.   [facility]  [facility], [facility] Care Management Assistant Direct Dial: [phone]  Fax: [phone]

## 2024-04-08 ENCOUNTER — Ambulatory Visit: Admitting: Family

## 2024-04-08 ENCOUNTER — Encounter: Payer: Self-pay | Admitting: Family

## 2024-04-08 VITALS — BP 138/82 | HR 69 | Ht 65.0 in

## 2024-04-08 DIAGNOSIS — R300 Dysuria: Secondary | ICD-10-CM | POA: Diagnosis not present

## 2024-04-08 DIAGNOSIS — R350 Frequency of micturition: Secondary | ICD-10-CM | POA: Diagnosis not present

## 2024-04-08 LAB — PSA: PSA: 5.44 ng/mL — ABNORMAL HIGH (ref 0.10–4.00)

## 2024-04-08 LAB — POCT URINALYSIS DIPSTICK
Bilirubin, UA: NEGATIVE
Blood, UA: NEGATIVE
Glucose, UA: NEGATIVE
Ketones, UA: NEGATIVE
Nitrite, UA: NEGATIVE
Protein, UA: NEGATIVE
Spec Grav, UA: <=1.005 — AB (ref 1.010–1.025)
Urobilinogen, UA: 0.2 U/dL
pH, UA: 7 (ref 5.0–8.0)

## 2024-04-08 LAB — HEMOGLOBIN A1C: Hgb A1c MFr Bld: 5.4 % (ref 4.6–6.5)

## 2024-04-08 MED ORDER — NITROFURANTOIN MONOHYD MACRO 100 MG PO CAPS
100.0000 mg | ORAL_CAPSULE | Freq: Two times a day (BID) | ORAL | 0 refills | Status: DC
Start: 2024-04-08 — End: 2024-05-24

## 2024-04-08 NOTE — Progress Notes (Signed)
 Walter Brown is a 83 y.o. male with the following history as recorded in EpicCare:  Patient Active Problem List   Diagnosis Date Noted   (Idiopathic) normal pressure hydrocephalus (HCC) 03/29/2024   Normal pressure hydrocephalus (HCC) 03/29/2024   Bilateral impacted cerumen 03/16/2024   Bilateral hearing loss due to cerumen impaction 03/08/2024   Anemia 03/07/2024   UTI (urinary tract infection) 03/02/2024   Severe sepsis (HCC) 03/02/2024   Acute metabolic encephalopathy 03/02/2024   Hypokalemia 03/02/2024   Recurrent falls 12/04/2022   Nocturia 12/04/2022   Hemoptysis 01/17/2022   ETD (Eustachian tube dysfunction), right 01/17/2022   Dysphagia 09/11/2021   Unsteady gait 09/11/2021   Insomnia 02/06/2021   Major depressive disorder 04/25/2020   Hyperglycemia 04/25/2020   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   Coronary artery calcification 11/23/2019   Parkinson's disease (HCC) 05/19/2019   Obstructive sleep apnea 11/09/2018   PAF (paroxysmal atrial fibrillation) 09/09/2018   Palpitations 09/07/2018   ASVD (arteriosclerotic vascular disease) 09/07/2018   Lipoma 08/04/2018   Bradycardia 06/28/2018   Cough 06/23/2018   TIA (transient ischemic attack) 03/10/2018   Arthritis 04/06/2017   Macular degeneration 03/03/2015   Back pain with radiation 04/26/2013   Respiratory illness    Hyperlipidemia    Essential hypertension    BPH (benign prostatic hyperplasia)    Basal cell carcinoma    Thrombocytopenia     Current Outpatient Medications  Medication Sig Dispense Refill   acetaminophen  (TYLENOL ) 500 MG tablet Take 2 tablets (1,000 mg total) by mouth every 6 (six) hours as needed for moderate pain (pain score 4-6). 90 tablet 0   amiodarone  (PACERONE ) 200 MG tablet TAKE 1/2 TABLET BY MOUTH DAILY 45 tablet 3   benzonatate  (TESSALON ) 100 MG capsule Take 1-2 capsules (100-200 mg total) by mouth 2 (two) times daily as needed for cough. 40 capsule  1   budesonide -formoterol  (SYMBICORT ) 160-4.5 MCG/ACT inhaler Inhale 2 puffs into the lungs 2 (two) times daily. 1 each 3   carbidopa -levodopa  (SINEMET  CR) 50-200 MG tablet TAKE 1 TABLET BY MOUTH EVERYDAY AT BEDTIME 90 tablet 0   carbidopa -levodopa  (SINEMET  IR) 25-100 MG tablet 2 AT 7AM, 2 AT 11AM, Take  1 AT 3PM, Take 1 at 7 PM 540 tablet 0   cholecalciferol (VITAMIN D3) 25 MCG (1000 UNIT) tablet Take 2,000 Units by mouth daily.     divalproex  (DEPAKOTE ) 125 MG DR tablet TAKE 1 TABLET (125 MG TOTAL) BY MOUTH AT BEDTIME 90 tablet 3   ezetimibe  (ZETIA ) 10 MG tablet TAKE 1 TABLET BY MOUTH EVERY DAY 90 tablet 3   guaifenesin  (ROBITUSSIN) 100 MG/5ML syrup Take 10 mLs (200 mg total) by mouth 3 (three) times daily as needed for cough.     hydrochlorothiazide  (HYDRODIURIL ) 25 MG tablet TAKE 1 TABLET BY MOUTH EVERY DAY 90 tablet 1   levalbuterol  (XOPENEX  HFA) 45 MCG/ACT inhaler INHALE 1 TO 2 PUFFS BY MOUTH EVERY 6 HOURS AS NEEDED FOR WHEEZE 15 each 1   Melatonin 10 MG TABS Take 10 mg by mouth daily as needed.     mirabegron  ER (MYRBETRIQ ) 50 MG TB24 tablet Take 50 mg by mouth daily.     Multiple Vitamins-Minerals (ICAPS AREDS 2 PO) Take 1 capsule by mouth in the morning and at bedtime.     nitrofurantoin, macrocrystal-monohydrate, (MACROBID) 100 MG capsule Take 1 capsule (100 mg total) by mouth 2 (two) times daily. 14 capsule 0   pantoprazole  (PROTONIX ) 40 MG tablet Take 1  tablet (40 mg total) by mouth daily as needed.     polyethylene glycol (MIRALAX  / GLYCOLAX ) 17 g packet Take 17 g by mouth daily as needed for mild constipation. 14 each 0   senna (SENOKOT) 8.6 MG TABS tablet Take 1 tablet by mouth in the morning and at bedtime.     sertraline  (ZOLOFT ) 100 MG tablet TAKE 1 TABLET BY MOUTH EVERY DAY 90 tablet 1   solifenacin (VESICARE) 5 MG tablet Take 5 mg by mouth daily.     Vitamin D , Ergocalciferol , (DRISDOL ) 1.25 MG (50000 UNIT) CAPS capsule Take 1 capsule (50,000 Units total) by mouth every 7  (seven) days. 4 capsule 4   No current facility-administered medications for this visit.    Allergies: Albuterol  sulfate, Azithromycin, and Doxycycline hyclate  Past Medical History:  Diagnosis Date   A-fib (HCC)    Allergy    Anemia    mild   Arthritis 04/06/2017   Asthma    childhood   ASVD (arteriosclerotic vascular disease) 09/07/2018   Back pain with radiation 04/26/2013   Low back with LLE radiculopathy   Basal cell carcinoma    skin- on nose- basal cell (20 yrs ago) forehead 1 year ago     Sees Dr Darice Reusing of dermatology   BPH (benign prostatic hyperplasia)    Bradycardia 06/28/2018   Cataract 10/07/2014   Cerumen impaction 08/10/2012   Chicken pox as child   Coronary artery calcification 11/23/2019   Dysphagia 09/11/2021   Elevated LFTs 11/2017   suspected drug/antibiotic induced   Essential hypertension    ETD (Eustachian tube dysfunction), right 01/17/2022   Generalized anxiety disorder    GERD (gastroesophageal reflux disease)    [place] measles as a child   Hemoptysis 01/17/2022   Hyperglycemia 04/25/2020   Hyperlipidemia    Insomnia 02/06/2021   Internal hemorrhoids    Irregular cardiac rhythm 03/10/2018   Lipoma 08/04/2018   Macular degeneration    Major depressive disorder 08/01/2012   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   Nocturia 12/04/2022   Obstructive sleep apnea 11/09/2018   Patient reports mild symptoms; was not prescribed a CPAP machine   Otitis externa 08/10/2012   PAF (paroxysmal atrial fibrillation)    Palpitations 09/07/2018   Parkinson's disease (HCC) 05/19/2019   Recurrent falls 12/04/2022   Thrombocytopenia    TIA (transient ischemic attack) 03/10/2018   Unsteady gait 09/11/2021    Past Surgical History:  Procedure Laterality Date   BELPHAROPTOSIS REPAIR     very young, b/l   EXCISIONAL HEMORRHOIDECTOMY     EYE SURGERY  2017   eyelids   HYDROCELE EXCISION / REPAIR  2012   b/l   SHUNT  REVISION VENTRICULAR-PERITONEAL Right 03/31/2024   Procedure: REVISION, SHUNT, VENTRICULOPERITONEAL;  Surgeon: Mavis Purchase, MD;  Location: [facility] OR;  Service: Neurosurgery;  Laterality: Right;  SHUNT REVISION   SKIN CANCER EXCISION     nose and forehead, basal cell CA   UPPER GASTROINTESTINAL ENDOSCOPY     VENTRICULOPERITONEAL SHUNT Right 03/29/2024   Procedure: SHUNT INSERTION VENTRICULAR-PERITONEAL;  Surgeon: Mavis Purchase, MD;  Location: [facility] OR;  Service: Neurosurgery;  Laterality: Right;  RT VP SHUNT PLACEMENT    Family History  Problem Relation Age of Onset   Hypertension Mother    Other Mother        MRSA   Heart disease Mother        stents   ADD / ADHD Mother  Arthritis Mother    Asthma Mother    Depression Mother    Vision loss Mother    Heart disease Father    Hypertension Father    Prostate cancer Father 10   Parkinson's disease Father    Cancer Father 56   Hearing loss Father    Stroke Father    Vision loss Father    Varicose Veins Father    Stroke Maternal Grandmother    Prostate cancer Maternal Grandfather    Asthma Maternal Grandfather    Cancer Maternal Grandfather    Cancer Paternal Grandfather        EYE   Hearing loss Paternal Grandfather    ADD / ADHD Son        ADHD   Other Son 24       part of 1 lung removed- due to infection   Alcohol abuse Son    Depression Son    Alcohol abuse Son    Alcohol abuse Maternal Uncle    Depression Maternal Uncle    Asthma Maternal Uncle    Heart disease Paternal Uncle    Colon cancer Neg Hx    Esophageal cancer Neg Hx    Rectal cancer Neg Hx    Stomach cancer Neg Hx     Social History   Tobacco Use   Smoking status: Former    Current packs/day: 0.00    Average packs/day: 1.5 packs/day for 15.0 years (22.5 ttl pk-yrs)    Types: Cigarettes    Start date: 10/06/1978    Quit date: 10/07/1978    Years since quitting: 45.5   Smokeless tobacco: Never   Tobacco comments:    QUIT 1980  Substance Use Topics    Alcohol use: Yes    Alcohol/week: 7.0 standard drinks of alcohol    Types: 7 Glasses of wine per week    Comment: wine with meals    Subjective:   Accompanied by wife; concerned for possible UTI; symptoms of urinary urgency and frequency for past few weeks; is having some increased thirst as well;   Was hospitalized with UTI in 02/2024; had shunt revision in hospital at end of June and did use a catheter during that time;   No blood in urine; no back or abdominal pain;   Objective:  Vitals:   04/08/24 1137  BP: 138/82  Pulse: 69  SpO2: 98%  Height: 5' 5 (1.651 m)    General: Well developed, well nourished, in no acute distress  Skin : Warm and dry.  Head: Normocephalic and atraumatic  Eyes: Sclera and conjunctiva clear; pupils round and reactive to light; extraocular movements intact  Lungs: Respirations unlabored; clear to auscultation bilaterally without wheeze, rales, rhonchi  CVS exam: normal rate and regular rhythm.  Neurologic: Alert and oriented; speech intact; face symmetrical; in wheelchair;  Assessment:  1. Dysuria   2. Urinary frequency     Plan:  Concern that original infection from May 2025 did not clear completely or 2nd infection may have developed after hospitalization;  Check U/A and urine culture today; check CBC, CMP, PSA, hgba1c today;  Start Macrobid 100 mg bid x 7 days- checked interaction with Amiodarone ; follow up to be determined based on culture results;   Return in about 2 weeks (around 04/22/2024) for lab appointment/ drop off urine.  Orders Placed This Encounter  Procedures   Urine Culture   Urine Culture    Standing Status:   Future    Expiration Date:  04/08/2025   Hemoglobin A1c   PSA   POCT Urinalysis Dipstick    Requested Prescriptions   Signed Prescriptions Disp Refills   nitrofurantoin, macrocrystal-monohydrate, (MACROBID) 100 MG capsule 14 capsule 0    Sig: Take 1 capsule (100 mg total) by mouth 2 (two) times daily.

## 2024-04-08 NOTE — Patient Instructions (Signed)
 Please bring back the sample in 2 weeks after you have finished your antibiotics so we can be sure that the infection has cleared completely; please schedule lab appointment today to drop off that urine.

## 2024-04-09 LAB — URINE CULTURE
MICRO NUMBER:: 16657498
SPECIMEN QUALITY:: ADEQUATE

## 2024-04-13 ENCOUNTER — Other Ambulatory Visit: Payer: Self-pay

## 2024-04-13 ENCOUNTER — Ambulatory Visit: Payer: Self-pay | Admitting: Family

## 2024-04-13 DIAGNOSIS — G20A2 Parkinson's disease without dyskinesia, with fluctuations: Secondary | ICD-10-CM | POA: Diagnosis not present

## 2024-04-13 DIAGNOSIS — I10 Essential (primary) hypertension: Secondary | ICD-10-CM | POA: Diagnosis not present

## 2024-04-13 DIAGNOSIS — G912 (Idiopathic) normal pressure hydrocephalus: Secondary | ICD-10-CM | POA: Diagnosis not present

## 2024-04-13 DIAGNOSIS — J449 Chronic obstructive pulmonary disease, unspecified: Secondary | ICD-10-CM | POA: Diagnosis not present

## 2024-04-13 DIAGNOSIS — I480 Paroxysmal atrial fibrillation: Secondary | ICD-10-CM | POA: Diagnosis not present

## 2024-04-13 DIAGNOSIS — G3184 Mild cognitive impairment, so stated: Secondary | ICD-10-CM | POA: Diagnosis not present

## 2024-04-13 DIAGNOSIS — Z7951 Long term (current) use of inhaled steroids: Secondary | ICD-10-CM | POA: Diagnosis not present

## 2024-04-13 DIAGNOSIS — D696 Thrombocytopenia, unspecified: Secondary | ICD-10-CM | POA: Diagnosis not present

## 2024-04-13 DIAGNOSIS — Z87440 Personal history of urinary (tract) infections: Secondary | ICD-10-CM | POA: Diagnosis not present

## 2024-04-13 NOTE — Patient Instructions (Signed)
 Visit Information  Thank you for taking time to visit with me today. Please don't hesitate to contact me if I can be of assistance to you before our next scheduled appointment.  Our next appointment is by telephone on 04/28/24 at 2:00 Please call the care guide team at [phone] if you need to cancel or reschedule your appointment.   Following is a copy of your care plan:   Goals Addressed             This Visit's Progress    VBCI RN Care Plan   On track    Problems:  Chronic Disease Management support and education needs related to Parkinsons Disease  Goal: Over the next 30 days the Patient will attend all scheduled medical appointments: with  04/22/24 lab visit, 04/20/24 Post op visit with Dr. Mavis, 05/24/24 PCP as evidenced by chart review and patient report        continue to work with RN Care Manager and/or Social Worker to address care management and care coordination needs related to Parkinsons Disease as evidenced by adherence to care management team scheduled appointments     not experience hospital admission as evidenced by review of electronic medical record. Hospital Admissions in last 6 months = 1 take all medications exactly as prescribed and will call provider for medication related questions as evidenced by chart review and patient report    verbalize basic understanding of Parkinsons Disease disease process and self health management plan as evidenced by patient asking questions and engaging in discussion with providers Patient reports significant improvement in mobility since shunt placement.  Now able to ambulate in the home independently, without the use of a walker.  Still receiving HH RN/PT/OT   Interventions:   Evaluation of current treatment plan related to Parkinsons Disease, Transportation, Level of care concerns, and Inability to perform ADL's independently self-management and patient's adherence to plan as established by provider. Discussed plans with  patient for ongoing care management follow up and provided patient with direct contact information for care management team Reviewed medications with patient and discussed future caregiving needs  Confirmed patient has HH PT/OT coming 1x weekly (will be paused and resume after hospital discharge) Discussed s/sx of infection and when to call the doctor.    Patient Self-Care Activities:  Attend all scheduled provider appointments Call pharmacy for medication refills 3-7 days in advance of running out of medications Call provider office for new concerns or questions  Take medications as prescribed    Plan:  Telephone follow up appointment with care management team member scheduled for:  04/28/24 at 2:00             Please call the Suicide and Crisis Lifeline: 988 call the [place]  National Suicide Prevention Lifeline: 708-621-8926 or TTY: 706-378-4112 TTY 617 128 7325) to talk to a trained counselor call 1-800-273-TALK (toll free, 24 hour hotline) if you are experiencing a Mental Health or Behavioral Health Crisis or need someone to talk to.  Patient verbalizes understanding of instructions and care plan provided today and agrees to view in MyChart. Active MyChart status and patient understanding of how to access instructions and care plan via MyChart confirmed with patient.     SIGNATURE  Olam Idol BSN RN CCM [place]  [facility], [facility] RN Care Manager Direct Dial: [phone] Fax: [phone]

## 2024-04-13 NOTE — Patient Outreach (Signed)
 Complex Care Management   Visit Note  04/13/2024  Name:  Walter Brown MRN: [id number] DOB: 13-Jul-1941  Situation: Referral received for Complex Care Management related to Parkinsons Disease and post operative shunt placement  I obtained verbal consent from Patient.  Visit completed with patient and spouse Walter Brown  on the phone  Background:   Past Medical History:  Diagnosis Date   A-fib [facility])    Allergy    Anemia    mild   Arthritis 04/06/2017   Asthma    childhood   ASVD (arteriosclerotic vascular disease) 09/07/2018   Back pain with radiation 04/26/2013   Low back with LLE radiculopathy   Basal cell carcinoma    skin- on nose- basal cell (20 yrs ago) forehead 1 year ago     Sees Dr Darice Reusing of dermatology   BPH (benign prostatic hyperplasia)    Bradycardia 06/28/2018   Cataract 10/07/2014   Cerumen impaction 08/10/2012   Chicken pox as child   Coronary artery calcification 11/23/2019   Dysphagia 09/11/2021   Elevated LFTs 11/2017   suspected drug/antibiotic induced   Essential hypertension    ETD (Eustachian tube dysfunction), right 01/17/2022   Generalized anxiety disorder    GERD (gastroesophageal reflux disease)    [place] measles as a child   Hemoptysis 01/17/2022   Hyperglycemia 04/25/2020   Hyperlipidemia    Insomnia 02/06/2021   Internal hemorrhoids    Irregular cardiac rhythm 03/10/2018   Lipoma 08/04/2018   Macular degeneration    Major depressive disorder 08/01/2012   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   Nocturia 12/04/2022   Obstructive sleep apnea 11/09/2018   Patient reports mild symptoms; was not prescribed a CPAP machine   Otitis externa 08/10/2012   PAF (paroxysmal atrial fibrillation)    Palpitations 09/07/2018   Parkinson's disease (HCC) 05/19/2019   Recurrent falls 12/04/2022   Thrombocytopenia    TIA (transient ischemic attack) 03/10/2018   Unsteady gait 09/11/2021    Assessment: Patient Reported  Symptoms:  Cognitive Cognitive Status: Alert and oriented to person, place, and time      Neurological Neurological Review of Symptoms: Weakness Neurological Management Strategies: Medication therapy, Routine screening, Medical device Neurological Comment: patient had shunt placement,6/23  now has [facility] PT/OT and RN coming.  patient now walking without walker  HEENT HEENT Symptoms Reported: No symptoms reported      Cardiovascular Cardiovascular Symptoms Reported: No symptoms reported Does patient have uncontrolled Hypertension?: No Cardiovascular Management Strategies: Routine screening, Medication therapy  Respiratory Respiratory Symptoms Reported: No symptoms reported Respiratory Management Strategies: Routine screening, Medication therapy  Endocrine Endocrine Symptoms Reported: No symptoms reported Is patient diabetic?: No    Gastrointestinal Gastrointestinal Symptoms Reported: Constipation Additional Gastrointestinal Details: managed with senna or miralax  Gastrointestinal Management Strategies: Diet modification, Medication therapy    Genitourinary Genitourinary Symptoms Reported: Incontinence Additional Genitourinary Details: patient seen 04/08/24 for UTI, taking macrobid , symptoms have improved.  still has incontinencre, using bedside commode Genitourinary Management Strategies: Incontinence garment/pad  Integumentary Integumentary Symptoms Reported: Incision Additional Integumentary Details: incision to scalp and abdomen healing well, staples to be removed 04/20/24 Skin Management Strategies: Dressing changes, Routine screening  Musculoskeletal Musculoskelatal Symptoms Reviewed: Unsteady gait, Weakness Additional Musculoskeletal Details: currently PT/OT HH coming 1x weekly  patient walking without walker in home. Musculoskeletal Management Strategies: Medication therapy, Routine screening, Medical device Falls in the past year?: No    Psychosocial Psychosocial Symptoms Reported:  No symptoms reported  03/16/2024    3:33 PM  Depression screen PHQ 2/9  Decreased Interest 0  Down, Depressed, Hopeless 0  PHQ - 2 Score 0  Altered sleeping 0  Tired, decreased energy 0  Change in appetite 0  Feeling bad or failure about yourself  0  Trouble concentrating 0  Moving slowly or fidgety/restless 0  Suicidal thoughts 0  PHQ-9 Score 0    There were no vitals filed for this visit.  Medications Reviewed Today     Reviewed by Lonzell Planas, RN (Registered Nurse) on 04/13/24 at 1514  Med List Status: <None>   Medication Order Taking? Sig Documenting Provider Last Dose Status Informant  acetaminophen  (TYLENOL ) 500 MG tablet [id number] Yes Take 2 tablets (1,000 mg total) by mouth every 6 (six) hours as needed for moderate pain (pain score 4-6). Nivia Colon, PA-C  Active Spouse/Significant Other, Care Giver  amiodarone  (PACERONE ) 200 MG tablet [id number] Yes TAKE 1/2 TABLET BY MOUTH DAILY Court Dorn PARAS, MD  Active Spouse/Significant Other, Care Giver  benzonatate  (TESSALON ) 100 MG capsule [id number] Yes Take 1-2 capsules (100-200 mg total) by mouth 2 (two) times daily as needed for cough. Domenica Harlene LABOR, MD  Active   budesonide -formoterol  (SYMBICORT ) 160-4.5 MCG/ACT inhaler [id number] Yes Inhale 2 puffs into the lungs 2 (two) times daily. Paz, Jose E, MD  Active Spouse/Significant Other, Care Giver  carbidopa -levodopa  (SINEMET  CR) 50-200 MG tablet [id number] Yes TAKE 1 TABLET BY MOUTH EVERYDAY AT BEDTIME Tat, Asberry RAMAN, DO  Active Spouse/Significant Other, Care Giver           Med Note (WHITE, DONETA RAMAN   Mon Mar 01, 2024 10:55 PM)    carbidopa -levodopa  (SINEMET  IR) 25-100 MG tablet [id number] Yes 2 AT 7AM, 2 AT 11AM, Take  1 AT 3PM, Take 1 at 7 PM Tat, Rebecca S, DO  Active Spouse/Significant Other, Care Giver  cholecalciferol (VITAMIN D3) 25 MCG (1000 UNIT) tablet [id number] Yes Take 2,000 Units by mouth daily. [provider]  Active Spouse/Significant Other,  Care Giver  divalproex  (DEPAKOTE ) 125 MG DR tablet [id number] Yes TAKE 1 TABLET (125 MG TOTAL) BY MOUTH AT BEDTIME Wertman, Sara E, PA-C  Active Spouse/Significant Other, Care Giver  ezetimibe  (ZETIA ) 10 MG tablet [id number] Yes TAKE 1 TABLET BY MOUTH EVERY DAY Walker, Caitlin S, NP  Active Spouse/Significant Other, Care Giver  guaifenesin  (ROBITUSSIN) 100 MG/5ML syrup [id number] Yes Take 10 mLs (200 mg total) by mouth 3 (three) times daily as needed for cough. Paz, Jose E, MD  Active Spouse/Significant Other, Care Giver  hydrochlorothiazide  (HYDRODIURIL ) 25 MG tablet [id number] Yes TAKE 1 TABLET BY MOUTH EVERY DAY Domenica Harlene LABOR, MD  Active Spouse/Significant Other, Care Giver  levalbuterol  (XOPENEX  HFA) 45 MCG/ACT inhaler [id number] Yes INHALE 1 TO 2 PUFFS BY MOUTH EVERY 6 HOURS AS NEEDED FOR WHEEZE Domenica Harlene LABOR, MD  Active Spouse/Significant Other, Care Giver  Melatonin 10 MG TABS [id number] Yes Take 10 mg by mouth daily as needed. [provider]  Active Spouse/Significant Other, Care Giver  mirabegron  ER (MYRBETRIQ ) 50 MG TB24 tablet [id number] Yes Take 50 mg by mouth daily. [provider]  Active Spouse/Significant Other, Care Giver  Multiple Vitamins-Minerals (ICAPS AREDS 2 PO) [id number] Yes Take 1 capsule by mouth in the morning and at bedtime. [provider]  Active Spouse/Significant Other, Care Giver  nitrofurantoin , macrocrystal-monohydrate, (MACROBID ) 100 MG capsule [id number] Yes Take 1 capsule (100 mg total) by mouth 2 (two) times daily. Jason Leita Repine, FNP  Active  pantoprazole  (PROTONIX ) 40 MG tablet [id number] Yes Take 1 tablet (40 mg total) by mouth daily as needed. Daryl Setter, NP  Active Spouse/Significant Other, Care Giver  polyethylene glycol (MIRALAX  / GLYCOLAX ) 17 g packet [id number] Yes Take 17 g by mouth daily as needed for mild constipation. Bergman, Meghan D, NP  Active   senna (SENOKOT) 8.6 MG TABS tablet [id number] Yes Take 1 tablet by  mouth in the morning and at bedtime. [provider]  Active Spouse/Significant Other, Care Giver  sertraline  (ZOLOFT ) 100 MG tablet [id number] Yes TAKE 1 TABLET BY MOUTH EVERY DAY Domenica Harlene LABOR, MD  Active Spouse/Significant Other, Care Giver  solifenacin (VESICARE) 5 MG tablet [id number] Yes Take 5 mg by mouth daily. [provider]  Active Spouse/Significant Other, Care Giver  Vitamin D , Ergocalciferol , (DRISDOL ) 1.25 MG (50000 UNIT) CAPS capsule [id number] Yes Take 1 capsule (50,000 Units total) by mouth every 7 (seven) days. Domenica Harlene LABOR, MD  Active             Recommendation:   Continue Current Plan of Care  Follow Up Plan:   Telephone follow up appointment date/time:  04/28/24 at 2:00  SIG  Olam Idol BSN RN CCM [place]  [facility], [facility] RN Care Manager Direct Dial: [phone] Fax: [phone]

## 2024-04-16 ENCOUNTER — Other Ambulatory Visit: Admitting: Neurology

## 2024-04-16 DIAGNOSIS — N319 Neuromuscular dysfunction of bladder, unspecified: Secondary | ICD-10-CM | POA: Diagnosis not present

## 2024-04-16 DIAGNOSIS — R3915 Urgency of urination: Secondary | ICD-10-CM | POA: Diagnosis not present

## 2024-04-16 DIAGNOSIS — N401 Enlarged prostate with lower urinary tract symptoms: Secondary | ICD-10-CM | POA: Diagnosis not present

## 2024-04-17 ENCOUNTER — Other Ambulatory Visit: Payer: Self-pay | Admitting: Family Medicine

## 2024-04-17 ENCOUNTER — Other Ambulatory Visit (HOSPITAL_BASED_OUTPATIENT_CLINIC_OR_DEPARTMENT_OTHER): Payer: Self-pay | Admitting: Family

## 2024-04-19 DIAGNOSIS — I10 Essential (primary) hypertension: Secondary | ICD-10-CM | POA: Diagnosis not present

## 2024-04-19 DIAGNOSIS — G912 (Idiopathic) normal pressure hydrocephalus: Secondary | ICD-10-CM | POA: Diagnosis not present

## 2024-04-19 DIAGNOSIS — I480 Paroxysmal atrial fibrillation: Secondary | ICD-10-CM | POA: Diagnosis not present

## 2024-04-19 DIAGNOSIS — I2510 Atherosclerotic heart disease of native coronary artery without angina pectoris: Secondary | ICD-10-CM | POA: Diagnosis not present

## 2024-04-19 DIAGNOSIS — Z48811 Encounter for surgical aftercare following surgery on the nervous system: Secondary | ICD-10-CM | POA: Diagnosis not present

## 2024-04-19 DIAGNOSIS — D696 Thrombocytopenia, unspecified: Secondary | ICD-10-CM | POA: Diagnosis not present

## 2024-04-19 DIAGNOSIS — G20A2 Parkinson's disease without dyskinesia, with fluctuations: Secondary | ICD-10-CM | POA: Diagnosis not present

## 2024-04-19 DIAGNOSIS — G3184 Mild cognitive impairment, so stated: Secondary | ICD-10-CM | POA: Diagnosis not present

## 2024-04-19 DIAGNOSIS — J4489 Other specified chronic obstructive pulmonary disease: Secondary | ICD-10-CM | POA: Diagnosis not present

## 2024-04-22 ENCOUNTER — Other Ambulatory Visit

## 2024-04-22 DIAGNOSIS — R300 Dysuria: Secondary | ICD-10-CM

## 2024-04-23 LAB — URINE CULTURE
MICRO NUMBER:: 16712031
SPECIMEN QUALITY:: ADEQUATE

## 2024-04-24 ENCOUNTER — Other Ambulatory Visit: Payer: Self-pay | Admitting: Neurology

## 2024-04-24 DIAGNOSIS — G20A2 Parkinson's disease without dyskinesia, with fluctuations: Secondary | ICD-10-CM

## 2024-04-26 ENCOUNTER — Ambulatory Visit: Payer: Medicare HMO | Admitting: Neurology

## 2024-04-26 DIAGNOSIS — D696 Thrombocytopenia, unspecified: Secondary | ICD-10-CM | POA: Diagnosis not present

## 2024-04-26 DIAGNOSIS — G912 (Idiopathic) normal pressure hydrocephalus: Secondary | ICD-10-CM | POA: Diagnosis not present

## 2024-04-26 DIAGNOSIS — I2510 Atherosclerotic heart disease of native coronary artery without angina pectoris: Secondary | ICD-10-CM | POA: Diagnosis not present

## 2024-04-26 DIAGNOSIS — J4489 Other specified chronic obstructive pulmonary disease: Secondary | ICD-10-CM | POA: Diagnosis not present

## 2024-04-26 DIAGNOSIS — I480 Paroxysmal atrial fibrillation: Secondary | ICD-10-CM | POA: Diagnosis not present

## 2024-04-26 DIAGNOSIS — I10 Essential (primary) hypertension: Secondary | ICD-10-CM | POA: Diagnosis not present

## 2024-04-26 DIAGNOSIS — G20A2 Parkinson's disease without dyskinesia, with fluctuations: Secondary | ICD-10-CM | POA: Diagnosis not present

## 2024-04-26 DIAGNOSIS — Z48811 Encounter for surgical aftercare following surgery on the nervous system: Secondary | ICD-10-CM | POA: Diagnosis not present

## 2024-04-26 DIAGNOSIS — G3184 Mild cognitive impairment, so stated: Secondary | ICD-10-CM | POA: Diagnosis not present

## 2024-04-27 ENCOUNTER — Ambulatory Visit

## 2024-04-28 ENCOUNTER — Other Ambulatory Visit: Payer: Self-pay

## 2024-04-28 NOTE — Patient Instructions (Signed)
 Visit Information  Thank you for taking time to visit with me today. Please don't hesitate to contact me if I can be of assistance to you before our next scheduled appointment.  Our next appointment is by telephone on 05/31/24 at 2:30 with Luke Griffiths [facility] Please call the care guide team at [phone] if you need to cancel or reschedule your appointment.   Following is a copy of your care plan:   Goals Addressed             This Visit's Progress    VBCI RN Care Plan   On track    Problems:  Chronic Disease Management support and education needs related to Parkinsons Disease  Goal: Over the next 30 days the Patient will attend all scheduled medical appointments: with  05/24/24 PCP as evidenced by chart review and patient report        continue to work with RN Care Manager and/or Social Worker to address care management and care coordination needs related to Parkinsons Disease as evidenced by adherence to care management team scheduled appointments     not experience hospital admission as evidenced by review of electronic medical record. Hospital Admissions in last 6 months = 1 take all medications exactly as prescribed and will call provider for medication related questions as evidenced by chart review and patient report    verbalize basic understanding of Parkinsons Disease disease process and self health management plan as evidenced by patient asking questions and engaging in discussion with providers Patient spouse reports patient with increased short term memory loss, delusions including that spouse is having an affair, spouse states mood is often irritable.   Interventions:  Encouraged spouse to call Dr. Evonnie to discuss changes in patient behaivors.  Evaluation of current treatment plan related to Parkinsons Disease, Transportation, Level of care concerns, and Inability to perform ADL's independently self-management and patient's adherence to plan as established by provider. Discussed  plans with patient for ongoing care management follow up and provided patient with direct contact information for care management team Reviewed medications with patient and discussed future caregiving needs  Confirmed patient has [facility] PT/OT coming 1x weekly  Patient Self-Care Activities:  Attend all scheduled provider appointments Call pharmacy for medication refills 3-7 days in advance of running out of medications Call provider office for new concerns or questions  Take medications as prescribed    Plan:  Telephone follow up appointment with care management team member scheduled for:  05/31/24/25 at 2:30             Please call the Suicide and Crisis Lifeline: 988 call the [place]  National Suicide Prevention Lifeline: 361-090-8651 or TTY: 239-531-1492 TTY 604-593-6277) to talk to a trained counselor call 1-800-273-TALK (toll free, 24 hour hotline) if you are experiencing a Mental Health or Behavioral Health Crisis or need someone to talk to.  Patient verbalizes understanding of instructions and care plan provided today and agrees to view in MyChart. Active MyChart status and patient understanding of how to access instructions and care plan via MyChart confirmed with patient.     SIGNATURE  Olam Idol BSN RN CCM [place]  [facility], [facility] RN Care Manager Direct Dial: [phone] Fax: [phone]

## 2024-04-28 NOTE — Patient Outreach (Signed)
 Complex Care Management   Visit Note  04/28/2024  Name:  Walter Brown MRN: [id number] DOB: 02-Jan-1941  Situation: Referral received for Complex Care Management related to Parkinsons Disease I obtained verbal consent from patient spouse Estefana.  Visit completed with patient spouse   on the phone  Background:   Past Medical History:  Diagnosis Date   A-fib (HCC)    Allergy 10/07/1944   Anemia 02/07/2018   mild   Arthritis 04/06/2017   Asthma    childhood   ASVD (arteriosclerotic vascular disease) 09/07/2018   Back pain with radiation 04/26/2013   Low back with LLE radiculopathy   Basal cell carcinoma    skin- on nose- basal cell (20 yrs ago) forehead 1 year ago     Sees Dr Darice Reusing of dermatology   BPH (benign prostatic hyperplasia)    Bradycardia 06/28/2018   Cataract 10/07/2014   Cerumen impaction 08/10/2012   Chicken pox as child   Coronary artery calcification 11/23/2019   Dysphagia 09/11/2021   Elevated LFTs 11/2017   suspected drug/antibiotic induced   Essential hypertension    ETD (Eustachian tube dysfunction), right 01/17/2022   Generalized anxiety disorder    GERD (gastroesophageal reflux disease)    [place] measles as a child   Hemoptysis 01/17/2022   Hyperglycemia 04/25/2020   Hyperlipidemia    Insomnia 02/06/2021   Internal hemorrhoids    Irregular cardiac rhythm 03/10/2018   Lipoma 08/04/2018   Macular degeneration    Major depressive disorder 08/01/2012   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   Nocturia 12/04/2022   Obstructive sleep apnea 11/09/2018   Patient reports mild symptoms; was not prescribed a CPAP machine   Otitis externa 08/10/2012   PAF (paroxysmal atrial fibrillation)    Palpitations 09/07/2018   Parkinson's disease (HCC) 05/19/2019   Recurrent falls 12/04/2022   Stroke (HCC) 02/27/2018   Thrombocytopenia    TIA (transient ischemic attack) 03/10/2018   Unsteady gait 09/11/2021     Assessment: Patient Reported Symptoms:  Cognitive Cognitive Status: Confused or disoriented, Difficulties with attention and concentration, Struggling with memory recall, Other: (spouse reports pt having delusions, accusing her of having an affair whenever she leaves the house.  Also reports increase in short term memory loss.  Encouraged spouse to call Dr Tat (neurologist) to discuss prior to visit on 9/25)      Neurological Neurological Review of Symptoms: Weakness Neurological Management Strategies: Medication therapy, Routine screening  HEENT        Cardiovascular Cardiovascular Symptoms Reported: No symptoms reported    Respiratory Respiratory Symptoms Reported: Dry cough Additional Respiratory Details: symptoms at baseline.    Endocrine Is patient diabetic?: No    Gastrointestinal Gastrointestinal Symptoms Reported: Constipation      Genitourinary Genitourinary Symptoms Reported: Incontinence Additional Genitourinary Details: last lab result showed urine cleared of infection, still has incontinence Genitourinary Management Strategies: Medical device, Incontinence garment/pad  Integumentary Integumentary Symptoms Reported: No symptoms reported Additional Integumentary Details: incision sites healed    Musculoskeletal Additional Musculoskeletal Details: spouse reports patient occasionally uses walker, usually in power chair, still has [facility] PT/OT Musculoskeletal Management Strategies: Medication therapy, Adequate rest, Exercise, Routine screening Falls in the past year?: No    Psychosocial Psychosocial Symptoms Reported: Not assessed Additional Psychological Details: patient was sleeping            03/16/2024    3:33 PM  Depression screen PHQ 2/9  Decreased Interest 0  Down, Depressed, Hopeless 0  PHQ -  2 Score 0  Altered sleeping 0  Tired, decreased energy 0  Change in appetite 0  Feeling bad or failure about yourself  0  Trouble concentrating 0  Moving  slowly or fidgety/restless 0  Suicidal thoughts 0  PHQ-9 Score 0    There were no vitals filed for this visit.  Medications Reviewed Today     Reviewed by Lonzell Planas, RN (Registered Nurse) on 04/28/24 at 1406  Med List Status: <None>   Medication Order Taking? Sig Documenting Provider Last Dose Status Informant  acetaminophen  (TYLENOL ) 500 MG tablet [id number]  Take 2 tablets (1,000 mg total) by mouth every 6 (six) hours as needed for moderate pain (pain score 4-6). Nivia Colon, PA-C  Active Spouse/Significant Other, Care Giver  amiodarone  (PACERONE ) 200 MG tablet [id number]  TAKE 1/2 TABLET BY MOUTH DAILY Court Dorn PARAS, MD  Active Spouse/Significant Other, Care Giver  benzonatate  (TESSALON ) 100 MG capsule [id number]  Take 1-2 capsules (100-200 mg total) by mouth 2 (two) times daily as needed for cough. Domenica Harlene LABOR, MD  Active   budesonide -formoterol  (SYMBICORT ) 160-4.5 MCG/ACT inhaler [id number]  Inhale 2 puffs into the lungs 2 (two) times daily. Paz, Jose E, MD  Active Spouse/Significant Other, Care Giver  carbidopa -levodopa  (SINEMET  CR) 50-200 MG tablet [id number] Yes TAKE 1 TABLET BY MOUTH EVERYDAY AT BEDTIME Tat, Asberry RAMAN, DO  Active   carbidopa -levodopa  (SINEMET  IR) 25-100 MG tablet [id number] Yes 2 AT 7AM, 2 AT 11AM, Take  1 AT 3PM, Take 1 at 7 PM Tat, Rebecca S, DO  Active Spouse/Significant Other, Care Giver  cholecalciferol (VITAMIN D3) 25 MCG (1000 UNIT) tablet [id number]  Take 2,000 Units by mouth daily. [provider]  Active Spouse/Significant Other, Care Giver  divalproex  (DEPAKOTE ) 125 MG DR tablet [id number] Yes TAKE 1 TABLET (125 MG TOTAL) BY MOUTH AT BEDTIME Wertman, Sara E, PA-C  Active Spouse/Significant Other, Care Giver  ezetimibe  (ZETIA ) 10 MG tablet [id number]  TAKE 1 TABLET BY MOUTH EVERY DAY Court Dorn PARAS, MD  Active   guaifenesin  (ROBITUSSIN) 100 MG/5ML syrup [id number]  Take 10 mLs (200 mg total) by mouth 3 (three) times daily as needed for cough.  Paz, Jose E, MD  Active Spouse/Significant Other, Care Giver  hydrochlorothiazide  (HYDRODIURIL ) 25 MG tablet [id number]  TAKE 1 TABLET BY MOUTH EVERY DAY Domenica Harlene LABOR, MD  Active Spouse/Significant Other, Care Giver  levalbuterol  (XOPENEX  HFA) 45 MCG/ACT inhaler [id number]  INHALE 1 TO 2 PUFFS BY MOUTH EVERY 6 HOURS AS NEEDED FOR WHEEZE Domenica Harlene LABOR, MD  Active Spouse/Significant Other, Care Giver  Melatonin 10 MG TABS [id number]  Take 10 mg by mouth daily as needed. [provider]  Active Spouse/Significant Other, Care Giver  mirabegron  ER (MYRBETRIQ ) 50 MG TB24 tablet [id number]  Take 50 mg by mouth daily. [provider]  Active Spouse/Significant Other, Care Giver  Multiple Vitamins-Minerals (ICAPS AREDS 2 PO) [id number]  Take 1 capsule by mouth in the morning and at bedtime. [provider]  Active Spouse/Significant Other, Care Giver  nitrofurantoin , macrocrystal-monohydrate, (MACROBID ) 100 MG capsule [id number]  Take 1 capsule (100 mg total) by mouth 2 (two) times daily.  Patient not taking: Reported on 04/28/2024   Jason Leita Repine, FNP  Active   pantoprazole  (PROTONIX ) 40 MG tablet [id number]  Take 1 tablet (40 mg total) by mouth daily as needed. Daryl Setter, NP  Active Spouse/Significant Other, Care Giver  polyethylene glycol (MIRALAX  / GLYCOLAX ) 17 g packet [id number]  Take 17 g by mouth  daily as needed for mild constipation. Bergman, Meghan D, NP  Active   senna (SENOKOT) 8.6 MG TABS tablet [id number]  Take 1 tablet by mouth in the morning and at bedtime. [provider]  Active Spouse/Significant Other, Care Giver  sertraline  (ZOLOFT ) 100 MG tablet [id number] Yes TAKE 1 TABLET BY MOUTH EVERY DAY Domenica Harlene LABOR, MD  Active   solifenacin (VESICARE) 5 MG tablet [id number]  Take 5 mg by mouth daily. [provider]  Active Spouse/Significant Other, Care Giver  Vitamin D , Ergocalciferol , (DRISDOL ) 1.25 MG (50000 UNIT) CAPS capsule [id number]   Take 1 capsule (50,000 Units total) by mouth every 7 (seven) days. Domenica Harlene LABOR, MD  Active             Recommendation:   Continue Current Plan of Care Call Dr Tat (Neuro) to discuss behavioral changes.  Follow Up Plan:   Telephone follow up appointment date/time:  05/31/24 at 2:30  SIG  Olam Idol BSN RN CCM [place]  [facility], [facility] RN Care Manager Direct Dial: [phone] Fax: [phone]

## 2024-05-03 DIAGNOSIS — G20A2 Parkinson's disease without dyskinesia, with fluctuations: Secondary | ICD-10-CM | POA: Diagnosis not present

## 2024-05-03 DIAGNOSIS — G3184 Mild cognitive impairment, so stated: Secondary | ICD-10-CM | POA: Diagnosis not present

## 2024-05-03 DIAGNOSIS — D696 Thrombocytopenia, unspecified: Secondary | ICD-10-CM | POA: Diagnosis not present

## 2024-05-03 DIAGNOSIS — J4489 Other specified chronic obstructive pulmonary disease: Secondary | ICD-10-CM | POA: Diagnosis not present

## 2024-05-03 DIAGNOSIS — Z48811 Encounter for surgical aftercare following surgery on the nervous system: Secondary | ICD-10-CM | POA: Diagnosis not present

## 2024-05-03 DIAGNOSIS — G912 (Idiopathic) normal pressure hydrocephalus: Secondary | ICD-10-CM | POA: Diagnosis not present

## 2024-05-03 DIAGNOSIS — I10 Essential (primary) hypertension: Secondary | ICD-10-CM | POA: Diagnosis not present

## 2024-05-03 DIAGNOSIS — I480 Paroxysmal atrial fibrillation: Secondary | ICD-10-CM | POA: Diagnosis not present

## 2024-05-03 DIAGNOSIS — I2510 Atherosclerotic heart disease of native coronary artery without angina pectoris: Secondary | ICD-10-CM | POA: Diagnosis not present

## 2024-05-04 ENCOUNTER — Ambulatory Visit: Payer: PPO | Admitting: Physician Assistant

## 2024-05-04 ENCOUNTER — Ambulatory Visit: Admitting: Physician Assistant

## 2024-05-05 ENCOUNTER — Ambulatory Visit: Admitting: Physician Assistant

## 2024-05-07 ENCOUNTER — Ambulatory Visit: Admitting: Student

## 2024-05-07 ENCOUNTER — Ambulatory Visit (INDEPENDENT_AMBULATORY_CARE_PROVIDER_SITE_OTHER): Admitting: Student

## 2024-05-07 ENCOUNTER — Encounter: Payer: Self-pay | Admitting: Student

## 2024-05-07 DIAGNOSIS — Z91199 Patient's noncompliance with other medical treatment and regimen due to unspecified reason: Secondary | ICD-10-CM

## 2024-05-07 NOTE — Progress Notes (Signed)
 Cancel / no show

## 2024-05-07 NOTE — Progress Notes (Deleted)
   Acute Office Visit  Subjective:     Patient ID: Walter Brown, male    DOB: 06/19/1941, 83 y.o.   MRN: [id number]  No chief complaint on file.   HPI Patient is in today for ***  ROS      Objective:    There were no vitals taken for this visit. {Vitals History (Optional):23777}  Physical Exam  No results found for any visits on 05/07/24.      Assessment & Plan:   Problem List Items Addressed This Visit   None   No orders of the defined types were placed in this encounter.   No follow-ups on file.  Cele Mote L Montina Dorrance, NP

## 2024-05-10 ENCOUNTER — Ambulatory Visit: Payer: Self-pay

## 2024-05-10 DIAGNOSIS — G912 (Idiopathic) normal pressure hydrocephalus: Secondary | ICD-10-CM | POA: Diagnosis not present

## 2024-05-10 DIAGNOSIS — D696 Thrombocytopenia, unspecified: Secondary | ICD-10-CM | POA: Diagnosis not present

## 2024-05-10 DIAGNOSIS — Z48811 Encounter for surgical aftercare following surgery on the nervous system: Secondary | ICD-10-CM | POA: Diagnosis not present

## 2024-05-10 DIAGNOSIS — J4489 Other specified chronic obstructive pulmonary disease: Secondary | ICD-10-CM | POA: Diagnosis not present

## 2024-05-10 DIAGNOSIS — I480 Paroxysmal atrial fibrillation: Secondary | ICD-10-CM | POA: Diagnosis not present

## 2024-05-10 DIAGNOSIS — I10 Essential (primary) hypertension: Secondary | ICD-10-CM | POA: Diagnosis not present

## 2024-05-10 DIAGNOSIS — G3184 Mild cognitive impairment, so stated: Secondary | ICD-10-CM | POA: Diagnosis not present

## 2024-05-10 DIAGNOSIS — G20A2 Parkinson's disease without dyskinesia, with fluctuations: Secondary | ICD-10-CM | POA: Diagnosis not present

## 2024-05-10 DIAGNOSIS — I2510 Atherosclerotic heart disease of native coronary artery without angina pectoris: Secondary | ICD-10-CM | POA: Diagnosis not present

## 2024-05-10 NOTE — Telephone Encounter (Signed)
 FYI Only or Action Required?: Action required by provider: requesting medication-patient was instructed.  Patient was last seen in primary care on 04/08/2024 by Jason Leita Repine, FNP.  Called Nurse Triage reporting Erectile Dysfunction.  Symptoms began several years ago.  Interventions attempted: Nothing.  Symptoms are: unchanged.  Triage Disposition: See PCP When Office is Open (Within 3 Days)  Patient/caregiver understands and will follow disposition?: No, wishes to speak with PCP  Copied from CRM #[id number]. Topic: Clinical - Red Word Triage >> May 10, 2024  3:52 PM Rosina BIRCH wrote: Reason for CRM: patient is having erectile dysfunction Reason for Disposition  All other penis - scrotum symptoms  (Exception: Painless rash < 24 hours duration.)  Answer Assessment - Initial Assessment Questions 1. SYMPTOM: What's the main symptom you're concerned about? (e.g., blood in semen, discharge or pus from penis, itching, pain, rash, swelling)     Erectile dysfunction 3. ONSET: When did erectile dysfunction  start?     Last couple of years 4. PAIN: Is there any pain? If Yes, ask: How bad is it?  (Scale 1-10; or mild, moderate, severe)     no 5. URINE: Any difficulty passing urine? If Yes, ask: When was the last time?     no 6. CAUSE: What do you think is causing the symptoms?     Erectile dysfunction 7. OTHER SYMPTOMS: Do you have any other symptoms? (e.g., blood in urine, abdomen pain, fever)     No other symptoms  Patient is asking for medication to be prescribed for erectile dysfunction.Denied any additional symptoms. patient already has an appointment scheduled for 8/18 with PCP. Patient will need a follow up phone call.  Protocols used: Penis and Scrotum Symptoms-A-AH

## 2024-05-12 ENCOUNTER — Telehealth: Payer: Self-pay

## 2024-05-12 NOTE — Telephone Encounter (Signed)
 Called patient and he is requesting a new medication for erectile dysfunction that he saw on tv. He scheduled appointment with Harlene Wheeler CHOL on 05/14/24 @ 1 PM to discuss.

## 2024-05-12 NOTE — Telephone Encounter (Signed)
 Copied from CRM [id number]. Topic: Clinical - Prescription Issue >> May 12, 2024 11:32 AM Berneda FALCON wrote: Reason for CRM: Patient is requesting a refill but does not know the name of the medication. States it combines Cilas and Viagra.

## 2024-05-12 NOTE — Telephone Encounter (Signed)
 Patient scheduled appointment with Harlene Wheeler CHOL.

## 2024-05-12 NOTE — Telephone Encounter (Signed)
 Called patient and no answer left vm to return call

## 2024-05-13 ENCOUNTER — Telehealth: Payer: Self-pay | Admitting: Neurology

## 2024-05-13 DIAGNOSIS — H43813 Vitreous degeneration, bilateral: Secondary | ICD-10-CM | POA: Diagnosis not present

## 2024-05-13 DIAGNOSIS — H353112 Nonexudative age-related macular degeneration, right eye, intermediate dry stage: Secondary | ICD-10-CM | POA: Diagnosis not present

## 2024-05-13 DIAGNOSIS — H35372 Puckering of macula, left eye: Secondary | ICD-10-CM | POA: Diagnosis not present

## 2024-05-13 DIAGNOSIS — H353221 Exudative age-related macular degeneration, left eye, with active choroidal neovascularization: Secondary | ICD-10-CM | POA: Diagnosis not present

## 2024-05-13 NOTE — Telephone Encounter (Signed)
 Pt wife wants to know if patient still needs the skin biopsy  please call

## 2024-05-14 ENCOUNTER — Telehealth (INDEPENDENT_AMBULATORY_CARE_PROVIDER_SITE_OTHER): Admitting: Student

## 2024-05-14 ENCOUNTER — Other Ambulatory Visit (HOSPITAL_COMMUNITY): Payer: Self-pay

## 2024-05-14 ENCOUNTER — Encounter: Payer: Self-pay | Admitting: Student

## 2024-05-14 ENCOUNTER — Telehealth: Payer: Self-pay

## 2024-05-14 DIAGNOSIS — R2681 Unsteadiness on feet: Secondary | ICD-10-CM

## 2024-05-14 DIAGNOSIS — N529 Male erectile dysfunction, unspecified: Secondary | ICD-10-CM

## 2024-05-14 HISTORY — DX: Male erectile dysfunction, unspecified: N52.9

## 2024-05-14 NOTE — Progress Notes (Signed)
 MyChart Video Visit    Virtual Visit via Video Note    Patient location: Home. Patient and provider in visit Provider location: Office  I discussed the limitations of evaluation and management by telemedicine and the availability of in person appointments. The patient expressed understanding and agreed to proceed.  Visit Date: 05/14/2024  Today's healthcare provider: Harlene LITTIE Jolly, NP     Subjective:    Patient ID: Walter Brown, male    DOB: 03-04-1941, 83 y.o.   MRN: [id number]  Chief Complaint  Patient presents with   Acute Visit    Patient presents today to discuss erectile dysfunction medication he saw on t.v.    HPI  Walter Brown is an 83 year old male, presents with his wife for visit.  Attempted to connect on video visit, however patient having technological issues with connecting to video.  Due to this, the remainder of the visit was conducted by telephone.  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 A-Fib, Hx TIA, HLD, HTN,  Follows with [facility] urology.  Patient is requesting renewal of home physical therapy. Both he and his wife report that it has been very beneficial and feel that continued therapy is needed to further improve his strength and stability.  Patient denies fever, chills, SOB, CP, palpitations, dyspnea, edema, HA, vision changes, N/V/D, abdominal pain, urinary symptoms, rash, weight changes, and recent illness or hospitalizations.   Past Medical History:  Diagnosis Date   A-fib (HCC)    Allergy 10/07/1944   Anemia 02/07/2018   mild   Arthritis 04/06/2017   Asthma    childhood   ASVD (arteriosclerotic vascular disease) 09/07/2018   Back pain with radiation 04/26/2013   Low back with LLE radiculopathy   Basal cell carcinoma    skin- on nose- basal cell (20 yrs ago) forehead 1 year ago     Sees Dr Darice Reusing of dermatology   BPH (benign prostatic hyperplasia)    Bradycardia 06/28/2018   Cataract  10/07/2014   Cerumen impaction 08/10/2012   Chicken pox as child   Coronary artery calcification 11/23/2019   Dysphagia 09/11/2021   Elevated LFTs 11/2017   suspected drug/antibiotic induced   Essential hypertension    ETD (Eustachian tube dysfunction), right 01/17/2022   Generalized anxiety disorder    GERD (gastroesophageal reflux disease)    [place] measles as a child   Hemoptysis 01/17/2022   Hyperglycemia 04/25/2020   Hyperlipidemia    Insomnia 02/06/2021   Internal hemorrhoids    Irregular cardiac rhythm 03/10/2018   Lipoma 08/04/2018   Macular degeneration    Major depressive disorder 08/01/2012   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   Nocturia 12/04/2022   Obstructive sleep apnea 11/09/2018   Patient reports mild symptoms; was not prescribed a CPAP machine   Otitis externa 08/10/2012   PAF (paroxysmal atrial fibrillation)    Palpitations 09/07/2018   Parkinson's disease (HCC) 05/19/2019   Recurrent falls 12/04/2022   Stroke (HCC) 02/27/2018   Thrombocytopenia    TIA (transient ischemic attack) 03/10/2018   Unsteady gait 09/11/2021    Past Surgical History:  Procedure Laterality Date   BELPHAROPTOSIS REPAIR     very young, b/l   EXCISIONAL HEMORRHOIDECTOMY     EYE SURGERY  2017   eyelids   HYDROCELE EXCISION / REPAIR  2012   b/l   SHUNT REVISION VENTRICULAR-PERITONEAL Right 03/31/2024   Procedure: REVISION, SHUNT, VENTRICULOPERITONEAL;  Surgeon: Mavis,  Reyes, MD;  Location: [facility] OR;  Service: Neurosurgery;  Laterality: Right;  SHUNT REVISION   SKIN CANCER EXCISION     nose and forehead, basal cell CA   UPPER GASTROINTESTINAL ENDOSCOPY     VENTRICULOPERITONEAL SHUNT Right 03/29/2024   Procedure: SHUNT INSERTION VENTRICULAR-PERITONEAL;  Surgeon: Mavis Reyes, MD;  Location: [facility] OR;  Service: Neurosurgery;  Laterality: Right;  RT VP SHUNT PLACEMENT    Family History  Problem Relation Age of Onset   Hypertension Mother     Other Mother        MRSA   Heart disease Mother        stents   ADD / ADHD Mother    Arthritis Mother    Asthma Mother    Depression Mother    Vision loss Mother    Heart disease Father    Hypertension Father    Prostate cancer Father 98   Parkinson's disease Father    Cancer Father 35   Hearing loss Father    Stroke Father    Vision loss Father    Varicose Veins Father    Stroke Maternal Grandmother    Prostate cancer Maternal Grandfather    Asthma Maternal Grandfather    Cancer Maternal Grandfather    Cancer Paternal Grandfather        EYE   Hearing loss Paternal Grandfather    ADD / ADHD Son        ADHD   Other Son 24       part of 1 lung removed- due to infection   Alcohol abuse Son    Depression Son    Alcohol abuse Son    Alcohol abuse Maternal Uncle    Depression Maternal Uncle    Asthma Maternal Uncle    Heart disease Paternal Uncle    Colon cancer Neg Hx    Esophageal cancer Neg Hx    Rectal cancer Neg Hx    Stomach cancer Neg Hx     Social History   Socioeconomic History   Marital status: Married    Spouse name: Not on file   Number of children: 2   Years of education: 16   Highest education level: Bachelor's degree (e.g., BA, AB, BS)  Occupational History   Occupation: Retired  Tobacco Use   Smoking status: Former    Current packs/day: 0.00    Average packs/day: 1.5 packs/day for 15.0 years (22.5 ttl pk-yrs)    Types: Cigarettes    Start date: 10/06/1978    Quit date: 10/07/1978    Years since quitting: 45.6   Smokeless tobacco: Never   Tobacco comments:    QUIT 1980  Vaping Use   Vaping status: Never Used  Substance and Sexual Activity   Alcohol use: Yes    Alcohol/week: 7.0 standard drinks of alcohol    Types: 7 Glasses of wine per week    Comment: wine with meals   Drug use: Not Currently   Sexual activity: Yes    Comment: lives with wife, 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   Other Topics Concern   Not on file  Social History Narrative   Right handed    Occasionally caffeine   Retired   Lives with wife   Social Drivers of Corporate investment banker Strain: Low Risk  (03/26/2022)   Overall Financial Resource Strain (CARDIA)    Difficulty of Paying Living Expenses: Not hard at all  Food Insecurity: No  Food Insecurity (04/05/2024)   Hunger Vital Sign    Worried About Running Out of Food in the Last Year: Never true    Ran Out of Food in the Last Year: Never true  Transportation Needs: No Transportation Needs (04/05/2024)   PRAPARE - Administrator, Civil Service (Medical): No    Lack of Transportation (Non-Medical): No  Physical Activity: Sufficiently Active (03/26/2022)   Exercise Vital Sign    Days of Exercise per Week: 5 days    Minutes of Exercise per Session: 50 min  Stress: Stress Concern Present (03/26/2022)   [employer] of Occupational Health - Occupational Stress Questionnaire    Feeling of Stress : To some extent  Social Connections: Moderately Isolated (03/29/2024)   Social Connection and Isolation Panel    Frequency of Communication with Friends and Family: More than three times a week    Frequency of Social Gatherings with Friends and Family: Once a week    Attends Religious Services: Never    Database administrator or Organizations: No    Attends Banker Meetings: Never    Marital Status: Married  Catering manager Violence: Not At Risk (04/05/2024)   Humiliation, Afraid, Rape, and Kick questionnaire    Fear of Current or Ex-Partner: No    Emotionally Abused: No    Physically Abused: No    Sexually Abused: No    Outpatient Medications Prior to Visit  Medication Sig Dispense Refill   acetaminophen  (TYLENOL ) 500 MG tablet Take 2 tablets (1,000 mg total) by mouth every 6 (six) hours as needed for moderate pain (pain score 4-6). 90 tablet 0   amiodarone  (PACERONE ) 200 MG tablet TAKE 1/2 TABLET BY MOUTH DAILY  45 tablet 3   benzonatate  (TESSALON ) 100 MG capsule Take 1-2 capsules (100-200 mg total) by mouth 2 (two) times daily as needed for cough. 40 capsule 1   budesonide -formoterol  (SYMBICORT ) 160-4.5 MCG/ACT inhaler Inhale 2 puffs into the lungs 2 (two) times daily. 1 each 3   carbidopa -levodopa  (SINEMET  CR) 50-200 MG tablet TAKE 1 TABLET BY MOUTH EVERYDAY AT BEDTIME 90 tablet 0   carbidopa -levodopa  (SINEMET  IR) 25-100 MG tablet 2 AT 7AM, 2 AT 11AM, Take  1 AT 3PM, Take 1 at 7 PM 540 tablet 0   cholecalciferol (VITAMIN D3) 25 MCG (1000 UNIT) tablet Take 2,000 Units by mouth daily.     divalproex  (DEPAKOTE ) 125 MG DR tablet TAKE 1 TABLET (125 MG TOTAL) BY MOUTH AT BEDTIME 90 tablet 3   ezetimibe  (ZETIA ) 10 MG tablet TAKE 1 TABLET BY MOUTH EVERY DAY 90 tablet 3   guaifenesin  (ROBITUSSIN) 100 MG/5ML syrup Take 10 mLs (200 mg total) by mouth 3 (three) times daily as needed for cough.     hydrochlorothiazide  (HYDRODIURIL ) 25 MG tablet TAKE 1 TABLET BY MOUTH EVERY DAY 90 tablet 1   levalbuterol  (XOPENEX  HFA) 45 MCG/ACT inhaler INHALE 1 TO 2 PUFFS BY MOUTH EVERY 6 HOURS AS NEEDED FOR WHEEZE 15 each 1   Melatonin 10 MG TABS Take 10 mg by mouth daily as needed.     mirabegron  ER (MYRBETRIQ ) 50 MG TB24 tablet Take 50 mg by mouth daily.     Multiple Vitamins-Minerals (ICAPS AREDS 2 PO) Take 1 capsule by mouth in the morning and at bedtime.     nitrofurantoin , macrocrystal-monohydrate, (MACROBID ) 100 MG capsule Take 1 capsule (100 mg total) by mouth 2 (two) times daily. 14 capsule 0   pantoprazole  (PROTONIX ) 40 MG tablet Take  1 tablet (40 mg total) by mouth daily as needed.     polyethylene glycol (MIRALAX  / GLYCOLAX ) 17 g packet Take 17 g by mouth daily as needed for mild constipation. 14 each 0   senna (SENOKOT) 8.6 MG TABS tablet Take 1 tablet by mouth in the morning and at bedtime.     sertraline  (ZOLOFT ) 100 MG tablet TAKE 1 TABLET BY MOUTH EVERY DAY 90 tablet 1   solifenacin (VESICARE) 5 MG tablet Take 5 mg  by mouth daily.     Vitamin D , Ergocalciferol , (DRISDOL ) 1.25 MG (50000 UNIT) CAPS capsule Take 1 capsule (50,000 Units total) by mouth every 7 (seven) days. 4 capsule 4   No facility-administered medications prior to visit.    Allergies  Allergen Reactions   Albuterol  Sulfate Palpitations   Azithromycin Other (See Comments)    Hepatotoxicity   Doxycycline Hyclate Other (See Comments)    Taken with Azithromycin and had Heaptotoxicity    ROS    See HPI Objective:    Physical Exam  Patient was unable to connect to the video visit; assessment completed via phone. Patient was alert, answered questions appropriately, and did not appear to be in acute distress. Both the patient and his wife participated in the call and were  and able to engage in conversation  There were no vitals taken for this visit. Wt Readings from Last 3 Encounters:  04/05/24 160 lb (72.6 kg)  03/29/24 160 lb (72.6 kg)  03/19/24 160 lb (72.6 kg)       Assessment & Plan:   Problem List Items Addressed This Visit     Unsteady gait - Primary    I am having Deo O. Geraci maintain his Multiple Vitamins-Minerals (ICAPS AREDS 2 PO), Melatonin, mirabegron  ER, pantoprazole , acetaminophen , amiodarone , hydrochlorothiazide , divalproex , solifenacin, budesonide -formoterol , guaifenesin , levalbuterol , carbidopa -levodopa , cholecalciferol, senna, benzonatate , Vitamin D  (Ergocalciferol ), polyethylene glycol, nitrofurantoin  (macrocrystal-monohydrate), ezetimibe , sertraline , and carbidopa -levodopa .  No orders of the defined types were placed in this encounter.   I discussed the assessment and treatment plan with the patient. The patient was provided an opportunity to ask questions and all were answered. The patient agreed with the plan and demonstrated an understanding of the instructions.   The patient was advised to call back or seek an in-person evaluation if the symptoms worsen or if the condition fails to improve as  anticipated.  Cialis can cause vasodilation, leading to hypotension and orthostatic dizziness, especially in those on antihypertensives.  Warn about risk of falls and advise rising slowly from sitting/lying positions.  I provided 10  minutes of  time during this encounter.   Harlene LITTIE Jolly, NP [facility] [phone] (phone) [phone] (fax)  [facility]

## 2024-05-14 NOTE — Assessment & Plan Note (Signed)
 Patient and his wife report that home PT has been beneficial. They were advised to contact the therapy provider directly to request an extension, as services were completed within the past week. If an extension is not possible, the patient should return to the clinic for an in-person evaluation to determine the need for a new home PT referral.

## 2024-05-14 NOTE — Telephone Encounter (Signed)
 Called and spoke to patients wife and patient doing well with the shunt. I let her know no need to perform biopsy if patient is doing well

## 2024-05-14 NOTE — Telephone Encounter (Signed)
 Front desk a spoke with patient's wife   Copied from CRM [id number]. Topic: Appointments - Appointment Info/Confirmation >> May 14, 2024  1:21 PM Rosina D wrote: Patient/patient representative is calling for information regarding an appointment. Patient wife called stating the patient missed his appointment and want to know if he can be rescheduled for today. I called [facility] and they stated they are trying to get in touch with the nurse to see if the appointment can be rescheduled for today. [facility] stated they will give the patient a call back. I relayed the message to the patient wife

## 2024-05-17 ENCOUNTER — Other Ambulatory Visit: Payer: Self-pay | Admitting: Neurology

## 2024-05-17 DIAGNOSIS — G20A2 Parkinson's disease without dyskinesia, with fluctuations: Secondary | ICD-10-CM

## 2024-05-20 ENCOUNTER — Other Ambulatory Visit: Admitting: Neurology

## 2024-05-23 NOTE — Assessment & Plan Note (Signed)
 Lives at home with wife and is working closely with Neurology

## 2024-05-23 NOTE — Assessment & Plan Note (Signed)
 Tolerating statin, encouraged heart healthy diet, avoid trans fats, minimize simple carbs and saturated fats. Increase exercise as tolerated

## 2024-05-23 NOTE — Assessment & Plan Note (Signed)
 Blood thinner stopped due to increased rate of falls

## 2024-05-23 NOTE — Assessment & Plan Note (Signed)
Working with neurology.

## 2024-05-23 NOTE — Assessment & Plan Note (Signed)
 [id number] acceptable, minimize simple carbs. Increase exercise as tolerated.

## 2024-05-23 NOTE — Assessment & Plan Note (Signed)
 Well controlled, no changes to meds. Encouraged heart healthy diet such as the DASH diet and exercise as tolerated.

## 2024-05-24 ENCOUNTER — Ambulatory Visit (INDEPENDENT_AMBULATORY_CARE_PROVIDER_SITE_OTHER): Admitting: Family Medicine

## 2024-05-24 ENCOUNTER — Encounter: Payer: Self-pay | Admitting: Family Medicine

## 2024-05-24 VITALS — BP 126/82 | HR 56 | Temp 98.1°F | Resp 12 | Ht 65.0 in | Wt 160.0 lb

## 2024-05-24 DIAGNOSIS — I480 Paroxysmal atrial fibrillation: Secondary | ICD-10-CM | POA: Diagnosis not present

## 2024-05-24 DIAGNOSIS — G912 (Idiopathic) normal pressure hydrocephalus: Secondary | ICD-10-CM

## 2024-05-24 DIAGNOSIS — E782 Mixed hyperlipidemia: Secondary | ICD-10-CM

## 2024-05-24 DIAGNOSIS — G20A1 Parkinson's disease without dyskinesia, without mention of fluctuations: Secondary | ICD-10-CM

## 2024-05-24 DIAGNOSIS — R296 Repeated falls: Secondary | ICD-10-CM

## 2024-05-24 DIAGNOSIS — I10 Essential (primary) hypertension: Secondary | ICD-10-CM

## 2024-05-24 DIAGNOSIS — R739 Hyperglycemia, unspecified: Secondary | ICD-10-CM | POA: Diagnosis not present

## 2024-05-24 NOTE — Patient Instructions (Addendum)
 Schedule medicare annual wellness visit at checkout.  [place] or Jobst compression socks  Edema  Edema is an abnormal buildup of fluids in the body tissues and under the skin. Swelling of the legs, feet, and ankles is a common symptom that becomes more likely as you get older. Swelling is also common in looser tissues, such as around the eyes. Pressing on the area may make a temporary dent in your skin (pitting edema). This fluid may also accumulate in your lungs (pulmonary edema). There are many possible causes of edema. Eating too much salt (sodium) and being on your feet or sitting for a long time can cause edema in your legs, feet, and ankles. Common causes of edema include: Certain medical conditions, such as heart failure, liver or kidney disease, and cancer. Weak leg blood vessels. An injury. Pregnancy. Medicines. Being obese. Low protein levels in the blood. Hot weather may make edema worse. Edema is usually painless. Your skin may look swollen or shiny. Follow these instructions at home: Medicines Take over-the-counter and prescription medicines only as told by your health care provider. Your health care provider may prescribe a medicine to help your body get rid of extra water (diuretic). Take this medicine if you are told to take it. Eating and drinking Eat a low-salt (low-sodium) diet to reduce fluid as told by your health care provider. Sometimes, eating less salt may reduce swelling. Depending on the cause of your swelling, you may need to limit how much fluid you drink (fluid restriction). General instructions Raise (elevate) the injured area above the level of your heart while you are sitting or lying down. Do not sit still or stand for long periods of time. Do not wear tight clothing. Do not wear garters on your upper legs. Exercise your legs to get your circulation going. This helps to move the fluid back into your blood vessels, and it may help the swelling go down. Wear  compression stockings as told by your health care provider. These stockings help to prevent blood clots and reduce swelling in your legs. It is important that these are the correct size. These stockings should be prescribed by your health care provider to prevent possible injuries. If elastic bandages or wraps are recommended, use them as told by your health care provider. Contact a health care provider if: Your edema does not get better with treatment. You have heart, liver, or kidney disease and have symptoms of edema. You have sudden and unexplained weight gain. Get help right away if: You develop shortness of breath or chest pain. You cannot breathe when you lie down. You develop pain, redness, or warmth in the swollen areas. You have heart, liver, or kidney disease and suddenly get edema. You have a fever and your symptoms suddenly get worse. These symptoms may be an emergency. Get help right away. Call 911. Do not wait to see if the symptoms will go away. Do not drive yourself to the hospital. Summary Edema is an abnormal buildup of fluids in the body tissues and under the skin. Eating too much salt (sodium)and being on your feet or sitting for a long time can cause edema in your legs, feet, and ankles. Raise (elevate) the injured area above the level of your heart while you are sitting or lying down. Follow your health care provider's instructions about diet and how much fluid you can drink. This information is not intended to replace advice given to you by your health care provider. Make sure you  discuss any questions you have with your health care provider. Document Revised: 05/28/2021 Document Reviewed: 05/28/2021 Elsevier Patient Education  2024 [employer].

## 2024-05-25 ENCOUNTER — Encounter: Payer: Self-pay | Admitting: Family Medicine

## 2024-05-25 NOTE — Progress Notes (Signed)
 Subjective:    Patient ID: Walter Brown, male    DOB: January 29, 1941, 83 y.o.   MRN: [id number]  Chief Complaint  Patient presents with   Follow-up    HPI Discussed the use of AI scribe software for clinical note transcription with the patient, who gave verbal consent to proceed.  History of Present Illness Walter Brown is an 83 year old male who presents for follow-up regarding balance and skin issues.  His balance has improved, and he has not fallen in several weeks. He uses a scooter during the day and a bedside commode at night to minimize movement and prevent falls. He inquires about symptoms related to fluid balance, indicating a concern about potential symptoms if fluid levels are too high or too low. He mentions having a shunt placed, which has significantly improved his condition. No new neurological symptoms, significant headaches, or confusion are reported.  He has developed sores on his legs after applying a salve prescribed by a dermatologist for potential precancerous lesions on his head. The sores became inflamed after application, but they are improving. He stopped using the salve on his legs and is considering contacting dermatology for further evaluation if they do not resolve.  He discusses having seborrheic keratoses, which he describes as 'grayish brown little waxy spots' that he has been scratching off. He notes that they bleed when scratched but are not itchy.  He is currently taking vitamin D  and has an upcoming appointment with urology to evaluate a previously noted increase in PSA levels. He is eating well, sleeping better, and has no issues with bowel movements.    Past Medical History:  Diagnosis Date   A-fib (HCC)    Allergy 10/07/1944   Anemia 02/07/2018   mild   Arthritis 04/06/2017   Asthma    childhood   ASVD (arteriosclerotic vascular disease) 09/07/2018   Back pain with radiation 04/26/2013   Low back with LLE radiculopathy   Basal cell  carcinoma    skin- on nose- basal cell (20 yrs ago) forehead 1 year ago     Sees Dr Darice Reusing of dermatology   BPH (benign prostatic hyperplasia)    Bradycardia 06/28/2018   Cataract 10/07/2014   Cerumen impaction 08/10/2012   Chicken pox as child   Coronary artery calcification 11/23/2019   Dysphagia 09/11/2021   Elevated LFTs 11/2017   suspected drug/antibiotic induced   Essential hypertension    ETD (Eustachian tube dysfunction), right 01/17/2022   Generalized anxiety disorder    GERD (gastroesophageal reflux disease)    [place] measles as a child   Hemoptysis 01/17/2022   Hyperglycemia 04/25/2020   Hyperlipidemia    Insomnia 02/06/2021   Internal hemorrhoids    Irregular cardiac rhythm 03/10/2018   Lipoma 08/04/2018   Macular degeneration    Major depressive disorder 08/01/2012   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   Nocturia 12/04/2022   Obstructive sleep apnea 11/09/2018   Patient reports mild symptoms; was not prescribed a CPAP machine   Otitis externa 08/10/2012   PAF (paroxysmal atrial fibrillation)    Palpitations 09/07/2018   Parkinson's disease (HCC) 05/19/2019   Recurrent falls 12/04/2022   Stroke (HCC) 02/27/2018   Thrombocytopenia    TIA (transient ischemic attack) 03/10/2018   Unsteady gait 09/11/2021    Past Surgical History:  Procedure Laterality Date   BELPHAROPTOSIS REPAIR     very young, b/l   EXCISIONAL HEMORRHOIDECTOMY     EYE SURGERY  2017  eyelids   HYDROCELE EXCISION / REPAIR  2012   b/l   SHUNT REVISION VENTRICULAR-PERITONEAL Right 03/31/2024   Procedure: REVISION, SHUNT, VENTRICULOPERITONEAL;  Surgeon: Mavis Purchase, MD;  Location: [facility] OR;  Service: Neurosurgery;  Laterality: Right;  SHUNT REVISION   SKIN CANCER EXCISION     nose and forehead, basal cell CA   UPPER GASTROINTESTINAL ENDOSCOPY     VENTRICULOPERITONEAL SHUNT Right 03/29/2024   Procedure: SHUNT INSERTION VENTRICULAR-PERITONEAL;   Surgeon: Mavis Purchase, MD;  Location: [facility] OR;  Service: Neurosurgery;  Laterality: Right;  RT VP SHUNT PLACEMENT    Family History  Problem Relation Age of Onset   Hypertension Mother    Other Mother        MRSA   Heart disease Mother        stents   ADD / ADHD Mother    Arthritis Mother    Asthma Mother    Depression Mother    Vision loss Mother    Heart disease Father    Hypertension Father    Prostate cancer Father 56   Parkinson's disease Father    Cancer Father 89   Hearing loss Father    Stroke Father    Vision loss Father    Varicose Veins Father    Stroke Maternal Grandmother    Prostate cancer Maternal Grandfather    Asthma Maternal Grandfather    Cancer Maternal Grandfather    Cancer Paternal Grandfather        EYE   Hearing loss Paternal Grandfather    ADD / ADHD Son        ADHD   Other Son 24       part of 1 lung removed- due to infection   Alcohol abuse Son    Depression Son    Alcohol abuse Son    Alcohol abuse Maternal Uncle    Depression Maternal Uncle    Asthma Maternal Uncle    Heart disease Paternal Uncle    Colon cancer Neg Hx    Esophageal cancer Neg Hx    Rectal cancer Neg Hx    Stomach cancer Neg Hx     Social History   Socioeconomic History   Marital status: Married    Spouse name: Not on file   Number of children: 2   Years of education: 16   Highest education level: Bachelor's degree (e.g., BA, AB, BS)  Occupational History   Occupation: Retired  Tobacco Use   Smoking status: Former    Current packs/day: 0.00    Average packs/day: 1.5 packs/day for 15.0 years (22.5 ttl pk-yrs)    Types: Cigarettes    Start date: 10/06/1978    Quit date: 10/07/1978    Years since quitting: 45.6   Smokeless tobacco: Never   Tobacco comments:    QUIT 1980  Vaping Use   Vaping status: Never Used  Substance and Sexual Activity   Alcohol use: Yes    Alcohol/week: 7.0 standard drinks of alcohol    Types: 7 Glasses of wine per week     Comment: wine with meals   Drug use: Not Currently   Sexual activity: Yes    Comment: lives with wife, 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  Other Topics Concern   Not on file  Social History Narrative   Right handed    Occasionally caffeine   Retired   Lives with wife   Social Drivers of Corporate investment banker  Strain: Low Risk  (03/26/2022)   Overall Financial Resource Strain (CARDIA)    Difficulty of Paying Living Expenses: Not hard at all  Food Insecurity: No Food Insecurity (04/05/2024)   Hunger Vital Sign    Worried About Running Out of Food in the Last Year: Never true    Ran Out of Food in the Last Year: Never true  Transportation Needs: No Transportation Needs (04/05/2024)   PRAPARE - Administrator, Civil Service (Medical): No    Lack of Transportation (Non-Medical): No  Physical Activity: Sufficiently Active (03/26/2022)   Exercise Vital Sign    Days of Exercise per Week: 5 days    Minutes of Exercise per Session: 50 min  Stress: Stress Concern Present (03/26/2022)   [employer] of Occupational Health - Occupational Stress Questionnaire    Feeling of Stress : To some extent  Social Connections: Moderately Isolated (03/29/2024)   Social Connection and Isolation Panel    Frequency of Communication with Friends and Family: More than three times a week    Frequency of Social Gatherings with Friends and Family: Once a week    Attends Religious Services: Never    Database administrator or Organizations: No    Attends Banker Meetings: Never    Marital Status: Married  Catering manager Violence: Not At Risk (04/05/2024)   Humiliation, Afraid, Rape, and Kick questionnaire    Fear of Current or Ex-Partner: No    Emotionally Abused: No    Physically Abused: No    Sexually Abused: No    Outpatient Medications Prior to Visit  Medication Sig Dispense Refill   acetaminophen  (TYLENOL ) 500 MG  tablet Take 2 tablets (1,000 mg total) by mouth every 6 (six) hours as needed for moderate pain (pain score 4-6). 90 tablet 0   amiodarone  (PACERONE ) 200 MG tablet TAKE 1/2 TABLET BY MOUTH DAILY 45 tablet 3   benzonatate  (TESSALON ) 100 MG capsule Take 1-2 capsules (100-200 mg total) by mouth 2 (two) times daily as needed for cough. 40 capsule 1   budesonide -formoterol  (SYMBICORT ) 160-4.5 MCG/ACT inhaler Inhale 2 puffs into the lungs 2 (two) times daily. 1 each 3   carbidopa -levodopa  (SINEMET  CR) 50-200 MG tablet TAKE 1 TABLET BY MOUTH EVERYDAY AT BEDTIME 90 tablet 0   carbidopa -levodopa  (SINEMET  IR) 25-100 MG tablet TAKE 2 TABLETS BY MOUTH AT 7AM, 2 TABS AT 11AM, 1 TAB AT 3PM, AND 1 TAB AT 7 PM 540 tablet 0   cholecalciferol (VITAMIN D3) 25 MCG (1000 UNIT) tablet Take 2,000 Units by mouth daily.     divalproex  (DEPAKOTE ) 125 MG DR tablet TAKE 1 TABLET (125 MG TOTAL) BY MOUTH AT BEDTIME 90 tablet 3   ezetimibe  (ZETIA ) 10 MG tablet TAKE 1 TABLET BY MOUTH EVERY DAY 90 tablet 3   GEMTESA 75 MG TABS Take 1 tablet by mouth daily.     guaifenesin  (ROBITUSSIN) 100 MG/5ML syrup Take 10 mLs (200 mg total) by mouth 3 (three) times daily as needed for cough.     hydrochlorothiazide  (HYDRODIURIL ) 25 MG tablet TAKE 1 TABLET BY MOUTH EVERY DAY 90 tablet 1   levalbuterol  (XOPENEX  HFA) 45 MCG/ACT inhaler INHALE 1 TO 2 PUFFS BY MOUTH EVERY 6 HOURS AS NEEDED FOR WHEEZE 15 each 1   Melatonin 10 MG TABS Take 10 mg by mouth daily as needed.     Multiple Vitamins-Minerals (ICAPS AREDS 2 PO) Take 1 capsule by mouth in the morning and at bedtime.  pantoprazole  (PROTONIX ) 40 MG tablet Take 1 tablet (40 mg total) by mouth daily as needed.     polyethylene glycol (MIRALAX  / GLYCOLAX ) 17 g packet Take 17 g by mouth daily as needed for mild constipation. 14 each 0   senna (SENOKOT) 8.6 MG TABS tablet Take 1 tablet by mouth in the morning and at bedtime.     sertraline  (ZOLOFT ) 100 MG tablet TAKE 1 TABLET BY MOUTH EVERY DAY  90 tablet 1   Vitamin D , Ergocalciferol , (DRISDOL ) 1.25 MG (50000 UNIT) CAPS capsule Take 1 capsule (50,000 Units total) by mouth every 7 (seven) days. 4 capsule 4   mirabegron  ER (MYRBETRIQ ) 50 MG TB24 tablet Take 50 mg by mouth daily.     solifenacin (VESICARE) 5 MG tablet Take 5 mg by mouth daily.     nitrofurantoin , macrocrystal-monohydrate, (MACROBID ) 100 MG capsule Take 1 capsule (100 mg total) by mouth 2 (two) times daily. 14 capsule 0   No facility-administered medications prior to visit.    Allergies  Allergen Reactions   Albuterol  Sulfate Palpitations   Azithromycin Other (See Comments)    Hepatotoxicity   Doxycycline Hyclate Other (See Comments)    Taken with Azithromycin and had Heaptotoxicity    Review of Systems  Constitutional:  Negative for fever and malaise/fatigue.  HENT:  Negative for congestion.   Eyes:  Negative for blurred vision.  Respiratory:  Negative for shortness of breath.   Cardiovascular:  Negative for chest pain, palpitations and leg swelling.  Gastrointestinal:  Negative for abdominal pain, blood in stool and nausea.  Genitourinary:  Negative for dysuria and frequency.  Musculoskeletal:  Negative for falls.  Skin:  Positive for rash.  Neurological:  Positive for weakness. Negative for dizziness, loss of consciousness and headaches.  Endo/Heme/Allergies:  Negative for environmental allergies.  Psychiatric/Behavioral:  Negative for depression. The patient is not nervous/anxious.        Objective:    Physical Exam Vitals reviewed.  Constitutional:      Appearance: Normal appearance. He is not ill-appearing.  HENT:     Head: Normocephalic and atraumatic.     Nose: Nose normal.  Eyes:     Conjunctiva/sclera: Conjunctivae normal.  Cardiovascular:     Rate and Rhythm: Normal rate.     Pulses: Normal pulses.     Heart sounds: Normal heart sounds. No murmur heard. Pulmonary:     Effort: Pulmonary effort is normal.     Breath sounds: Normal  breath sounds. No wheezing.  Abdominal:     Palpations: Abdomen is soft. There is no mass.     Tenderness: There is no abdominal tenderness.  Musculoskeletal:     Cervical back: Normal range of motion.     Right lower leg: No edema.     Left lower leg: No edema.  Skin:    General: Skin is warm and dry.     Findings: Lesion present.     Comments: Raised erythematous patches bilateral thighs  Neurological:     General: No focal deficit present.     Mental Status: He is alert and oriented to person, place, and time.  Psychiatric:        Mood and Affect: Mood normal.     BP 126/82 (BP Location: Right Arm, Patient Position: Sitting, Cuff Size: Normal)   Pulse (!) 56   Temp 98.1 F (36.7 C) (Oral)   Resp 12   Ht 5' 5 (1.651 m)   Wt 160 lb (72.6 kg)  SpO2 96%   BMI 26.63 kg/m  Wt Readings from Last 3 Encounters:  05/24/24 160 lb (72.6 kg)  04/05/24 160 lb (72.6 kg)  03/29/24 160 lb (72.6 kg)    Diabetic Foot Exam - Simple   No data filed    Lab Results  Component Value Date   WBC 8.1 03/08/2024   HGB 13.5 03/08/2024   HCT 39.9 03/08/2024   PLT 224.0 03/08/2024   GLUCOSE 96 03/08/2024   CHOL 166 02/04/2024   TRIG 92.0 02/04/2024   HDL 69.30 02/04/2024   LDLCALC 79 02/04/2024   ALT 10 03/08/2024   AST 16 03/08/2024   NA 141 03/08/2024   K 3.6 03/08/2024   CL 102 03/08/2024   CREATININE 0.91 03/08/2024   BUN 12 03/08/2024   CO2 30 03/08/2024   TSH 1.74 02/04/2024   PSA 5.44 (H) 04/08/2024   INR 1.1 03/01/2024   HGBA1C 5.4 04/08/2024    Lab Results  Component Value Date   TSH 1.74 02/04/2024   Lab Results  Component Value Date   WBC 8.1 03/08/2024   HGB 13.5 03/08/2024   HCT 39.9 03/08/2024   MCV 94.1 03/08/2024   PLT 224.0 03/08/2024   Lab Results  Component Value Date   NA 141 03/08/2024   K 3.6 03/08/2024   CO2 30 03/08/2024   GLUCOSE 96 03/08/2024   BUN 12 03/08/2024   CREATININE 0.91 03/08/2024   BILITOT 0.6 03/08/2024   ALKPHOS 57  03/08/2024   AST 16 03/08/2024   ALT 10 03/08/2024   PROT 6.5 03/08/2024   ALBUMIN 4.2 03/08/2024   CALCIUM  9.7 03/08/2024   ANIONGAP 9 03/04/2024   EGFR 74 07/09/2023   GFR 78.27 03/08/2024   Lab Results  Component Value Date   CHOL 166 02/04/2024   Lab Results  Component Value Date   HDL 69.30 02/04/2024   Lab Results  Component Value Date   LDLCALC 79 02/04/2024   Lab Results  Component Value Date   TRIG 92.0 02/04/2024   Lab Results  Component Value Date   CHOLHDL 2 02/04/2024   Lab Results  Component Value Date   HGBA1C 5.4 04/08/2024       Assessment & Plan:  (Idiopathic) normal pressure hydrocephalus (HCC) Assessment & Plan: Working with neurology   Essential hypertension Assessment & Plan: Well controlled, no changes to meds. Encouraged heart healthy diet such as the DASH diet and exercise as tolerated.     Hyperglycemia Assessment & Plan: hgba1c acceptable, minimize simple carbs. Increase exercise as tolerated.    Mixed hyperlipidemia Assessment & Plan: Tolerating statin, encouraged heart healthy diet, avoid trans fats, minimize simple carbs and saturated fats. Increase exercise as tolerated   PAF (paroxysmal atrial fibrillation) Assessment & Plan: Blood thinner stopped due to increased rate of falls   Parkinson's disease, unspecified whether dyskinesia present, unspecified whether manifestations fluctuate (HCC) Assessment & Plan: Lives at home with wife and is working closely with Neurology   Recurrent falls -     Ambulatory referral to Home Health  Normal pressure hydrocephalus [facility]) -     Ambulatory referral to Home Health    Assessment and Plan Assessment & Plan Idiopathic normal pressure hydrocephalus, status post shunt Status post shunt placement with significant improvement in symptoms. No new neurological symptoms reported. Inquired about symptoms related to fluid balance, but no specific symptoms indicating shunt  malfunction are present. Discussed the basic mechanics of shunts and the lack of modern monitoring technology.  Repeated falls, improved No falls reported in several weeks, possibly due to physical therapy and shunt placement. - Submit referral for physical therapy with [facility] - Encourage continued use of mobility aids as needed  Lower extremity edema Presence of lower extremity edema. No signs of infection or acute distress. Discussed management strategies including elevation and compression socks. Emphasized the importance of adequate protein intake to maintain oncotic pressure and prevent fluid accumulation. - Advise elevating legs above heart level several times a day - Recommend compression socks for prolonged sitting or outings - Ensure adequate protein intake to maintain oncotic pressure  Unresolved leg skin lesions, possible reaction to topical medication Unresolved skin lesions on legs, possibly due to inappropriate application of topical medication. Lesions are improving but not fully resolved. No signs of infection. Discussed the need for dermatology evaluation and potential biopsy if lesions persist. - Instruct to discontinue use of the topical salve on legs - Call dermatology to explain the situation and request a biopsy if lesions persist    Recording duration: 16 minutes     Harlene Horton, MD

## 2024-05-27 DIAGNOSIS — I10 Essential (primary) hypertension: Secondary | ICD-10-CM | POA: Diagnosis not present

## 2024-05-27 DIAGNOSIS — R001 Bradycardia, unspecified: Secondary | ICD-10-CM | POA: Diagnosis not present

## 2024-05-27 DIAGNOSIS — I480 Paroxysmal atrial fibrillation: Secondary | ICD-10-CM | POA: Diagnosis not present

## 2024-05-27 DIAGNOSIS — G20A1 Parkinson's disease without dyskinesia, without mention of fluctuations: Secondary | ICD-10-CM | POA: Diagnosis not present

## 2024-05-27 DIAGNOSIS — G3184 Mild cognitive impairment, so stated: Secondary | ICD-10-CM | POA: Diagnosis not present

## 2024-05-27 DIAGNOSIS — D179 Benign lipomatous neoplasm, unspecified: Secondary | ICD-10-CM | POA: Diagnosis not present

## 2024-05-27 DIAGNOSIS — D630 Anemia in neoplastic disease: Secondary | ICD-10-CM | POA: Diagnosis not present

## 2024-05-27 DIAGNOSIS — G912 (Idiopathic) normal pressure hydrocephalus: Secondary | ICD-10-CM | POA: Diagnosis not present

## 2024-05-27 DIAGNOSIS — I2510 Atherosclerotic heart disease of native coronary artery without angina pectoris: Secondary | ICD-10-CM | POA: Diagnosis not present

## 2024-05-29 ENCOUNTER — Other Ambulatory Visit: Payer: Self-pay | Admitting: Family Medicine

## 2024-05-29 DIAGNOSIS — E559 Vitamin D deficiency, unspecified: Secondary | ICD-10-CM

## 2024-05-31 ENCOUNTER — Encounter: Payer: Self-pay | Admitting: *Deleted

## 2024-05-31 ENCOUNTER — Ambulatory Visit (INDEPENDENT_AMBULATORY_CARE_PROVIDER_SITE_OTHER): Admitting: *Deleted

## 2024-05-31 ENCOUNTER — Other Ambulatory Visit: Payer: Self-pay | Admitting: *Deleted

## 2024-05-31 ENCOUNTER — Other Ambulatory Visit: Payer: Self-pay

## 2024-05-31 VITALS — Ht 65.0 in | Wt 160.0 lb

## 2024-05-31 DIAGNOSIS — Z0000 Encounter for general adult medical examination without abnormal findings: Secondary | ICD-10-CM | POA: Diagnosis not present

## 2024-05-31 NOTE — Patient Instructions (Addendum)
 [name] , Thank you for taking time out of your busy schedule to complete your Annual Wellness Visit with me. I enjoyed our conversation and look forward to speaking with you again next year. I, as well as your care team,  appreciate your ongoing commitment to your health goals. Please review the following plan we discussed and let me know if I can assist you in the future. Your Game plan/ To Do List     Follow up Visits: Next Medicare AWV with our clinical staff: 06/01/25 11am, telephone    Next Office Visit with your provider: 09/23/24 11:20, Dr Domenica  Clinician Recommendations:  Aim for 30 minutes of exercise or brisk walking, 6-8 glasses of water, and 5 servings of fruits and vegetables each day.       This is a list of the screening recommended for you and due dates:  Health Maintenance  Topic Date Due   Flu Shot  06/08/2024*   COVID-19 Vaccine (8 - 2024-25 season) 10/06/2024*   Medicare Annual Wellness Visit  05/31/2025   DTaP/Tdap/Td vaccine (3 - Td or Tdap) 11/03/2033   Pneumococcal Vaccine for age over 67  Completed   Zoster (Shingles) Vaccine  Completed   HPV Vaccine  Aged Out   Meningitis B Vaccine  Aged Out   Hepatitis C Screening  Discontinued  *Topic was postponed. The date shown is not the original due date.    Advanced directives: (Copy Requested) Please bring a copy of your health care power of attorney and living will to the office to be added to your chart at your convenience. You can mail to [facility] [address] [place], [place] [address] or email to [email or web address] Advance Care Planning is important because it:  [x]  Makes sure you receive the medical care that is consistent with your values, goals, and preferences  [x]  It provides guidance to your family and loved ones and reduces their decisional burden about whether or not they are making the right decisions based on your wishes.  Follow the link provided in your after  visit summary or read over the paperwork we have mailed to you to help you started getting your Advance Directives in place. If you need assistance in completing these, please reach out to us  so that we can help you!  See attachments for Preventive Care and Fall Prevention Tips.

## 2024-05-31 NOTE — Patient Outreach (Signed)
 Complex Care Management   Visit Note  05/31/2024  Name:  Walter Brown MRN: [id number] DOB: 1941/03/27  Situation: Referral received for Complex Care Management related to parkinson's disease I obtained verbal consent from wife Animator.  Visit completed with Caregiver spouse  on the phone   Doing okay per wife without any new medical concerns Wears hearing aid but still (hard of hearing) HOH  PT start this week using his cane or walker He also has a Tax adviser care services now active 6 hours a day + wife as caregiver  Background:   Past Medical History:  Diagnosis Date   A-fib (HCC)    Allergy 10/07/1944   Anemia 02/07/2018   mild   Arthritis 04/06/2017   Asthma    childhood   ASVD (arteriosclerotic vascular disease) 09/07/2018   Back pain with radiation 04/26/2013   Low back with LLE radiculopathy   Basal cell carcinoma    skin- on nose- basal cell (20 yrs ago) forehead 1 year ago     Sees Dr Darice Reusing of dermatology   BPH (benign prostatic hyperplasia)    Bradycardia 06/28/2018   Cataract 10/07/2014   Cerumen impaction 08/10/2012   Chicken pox as child   Coronary artery calcification 11/23/2019   Dysphagia 09/11/2021   Elevated LFTs 11/2017   suspected drug/antibiotic induced   Essential hypertension    ETD (Eustachian tube dysfunction), right 01/17/2022   Generalized anxiety disorder    GERD (gastroesophageal reflux disease)    [place] measles as a child   Hemoptysis 01/17/2022   Hyperglycemia 04/25/2020   Hyperlipidemia    Insomnia 02/06/2021   Internal hemorrhoids    Irregular cardiac rhythm 03/10/2018   Lipoma 08/04/2018   Macular degeneration    Major depressive disorder 08/01/2012   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   Nocturia 12/04/2022   Obstructive sleep apnea 11/09/2018   Patient reports mild symptoms; was not prescribed a CPAP machine   Otitis externa 08/10/2012   PAF (paroxysmal atrial  fibrillation)    Palpitations 09/07/2018   Parkinson's disease (HCC) 05/19/2019   Recurrent falls 12/04/2022   Stroke (HCC) 02/27/2018   Thrombocytopenia    TIA (transient ischemic attack) 03/10/2018   Unsteady gait 09/11/2021    Assessment: Patient Reported Symptoms:  Cognitive Cognitive Status: Confused or disoriented, Difficulties with attention and concentration, Struggling with memory recall Cognitive/Intellectual Conditions Management [RPT]: None reported or documented in medical history or problem list      Neurological Neurological Review of Symptoms: Weakness Neurological Management Strategies: Medical device, Medication therapy, Routine screening Neurological Self-Management Outcome: 4 (good)  HEENT HEENT Symptoms Reported: Change or loss of hearing HEENT Management Strategies: Routine screening, Medical device HEENT Self-Management Outcome: 3 (uncertain) HEENT Comment: hard of hearing HOH even with hearing aides in he may respond incorrectlty Ear problem(s)  Cardiovascular Cardiovascular Symptoms Reported: Swelling in legs or feet (wearing compression hose can weigh last pcp 160 lbs) Does patient have uncontrolled Hypertension?: No Weight: 160 lb (72.6 kg)  Respiratory Respiratory Symptoms Reported: Dry cough, Productive cough Other Respiratory Symptoms: no pets, will monitor for change in secretions Respiratory Management Strategies: Medication therapy, Adequate rest Respiratory Self-Management Outcome: 3 (uncertain)  Endocrine Endocrine Symptoms Reported: No symptoms reported Is patient diabetic?: No Is patient checking blood sugars at home?: No Endocrine Self-Management Outcome: 4 (good)  Gastrointestinal Gastrointestinal Symptoms Reported: Constipation Additional Gastrointestinal Details: has as needed miralax  & senna Gastrointestinal Management Strategies: Adequate rest, Diet modification, Medication  therapy Gastrointestinal Self-Management Outcome: 4  (good) Nutrition Risk Screen (CP): No indicators present  Genitourinary Genitourinary Symptoms Reported: Incontinence Genitourinary Management Strategies: Incontinence garment/pad Genitourinary Self-Management Outcome: 3 (uncertain)  Integumentary Integumentary Symptoms Reported: No symptoms reported Skin Self-Management Outcome: 4 (good)  Musculoskeletal Musculoskelatal Symptoms Reviewed: Unsteady gait, Weakness Additional Musculoskeletal Details: uses walker & scooter As of 05/31/24 PT will start working with him again next week Musculoskeletal Management Strategies: Adequate rest, Medical device, Medication therapy, Routine screening Musculoskeletal Self-Management Outcome: 3 (uncertain) Falls in the past year?: No Number of falls in past year: 1 or less Was there an injury with Fall?: No Fall Risk Category Calculator: 0 Patient Fall Risk Level: Low Fall Risk Patient at Risk for Falls Due to: History of fall(s), Impaired balance/gait, Impaired mobility, Mental status change Fall risk Follow up: Falls evaluation completed, Falls prevention discussed  Psychosocial Psychosocial Symptoms Reported: No symptoms reported Behavioral Management Strategies: Medication therapy, Support system, Adequate rest Behavioral Health Self-Management Outcome: 4 (good) Major Change/Loss/Stressor/Fears (CP): Medical condition, self Techniques to Cope with Loss/Stress/Change: Diversional activities, Medication Quality of Family Relationships: helpful, involved, supportive Do you feel physically threatened by others?: No    05/31/2024    PHQ2-9 Depression Screening   Little interest or pleasure in doing things Not at all  Feeling down, depressed, or hopeless Not at all  PHQ-2 - Total Score 0  Trouble falling or staying asleep, or sleeping too much    Feeling tired or having little energy    Poor appetite or overeating     Feeling bad about yourself - or that you are a failure or have let yourself or your  family down    Trouble concentrating on things, such as reading the newspaper or watching television    Moving or speaking so slowly that other people could have noticed.  Or the opposite - being so fidgety or restless that you have been moving around a lot more than usual    Thoughts that you would be better off dead, or hurting yourself in some way    PHQ2-9 Total Score    If you checked off any problems, how difficult have these problems made it for you to do your work, take care of things at home, or get along with other people    Depression Interventions/Treatment      There were no vitals filed for this visit.  Medications Reviewed Today     Reviewed by Ramonita Suzen CROME, RN (Registered Nurse) on 05/31/24 at 1535  Med List Status: <None>   Medication Order Taking? Sig Documenting Provider Last Dose Status Informant  acetaminophen  (TYLENOL ) 500 MG tablet [id number]  Take 2 tablets (1,000 mg total) by mouth every 6 (six) hours as needed for moderate pain (pain score 4-6). Nivia Colon, PA-C  Active Spouse/Significant Other, Care Giver  amiodarone  (PACERONE ) 200 MG tablet [id number] Yes TAKE 1/2 TABLET BY MOUTH DAILY Court Dorn PARAS, MD  Active Spouse/Significant Other, Care Giver  benzonatate  (TESSALON ) 100 MG capsule [id number] Yes Take 1-2 capsules (100-200 mg total) by mouth 2 (two) times daily as needed for cough. Domenica Harlene LABOR, MD  Active   budesonide -formoterol  (SYMBICORT ) 160-4.5 MCG/ACT inhaler [id number] Yes Inhale 2 puffs into the lungs 2 (two) times daily. Paz, Jose E, MD  Active Spouse/Significant Other, Care Giver  carbidopa -levodopa  (SINEMET  CR) 50-200 MG tablet [id number]  TAKE 1 TABLET BY MOUTH EVERYDAY AT BEDTIME Tat, Asberry RAMAN, DO  Active   carbidopa -levodopa  (SINEMET  IR) 25-100 MG tablet [id number]  TAKE 2 TABLETS BY MOUTH AT 7AM, 2 TABS AT 11AM, 1 TAB AT 3PM, AND 1 TAB AT 7 PM Tat, Rebecca S, DO  Active   cholecalciferol (VITAMIN D3) 25 MCG (1000 UNIT) tablet [id number] Yes  Take 2,000 Units by mouth daily. [provider]  Active Spouse/Significant Other, Care Giver  divalproex  (DEPAKOTE ) 125 MG DR tablet [id number]  TAKE 1 TABLET (125 MG TOTAL) BY MOUTH AT BEDTIME Wertman, Sara E, PA-C  Active Spouse/Significant Other, Care Giver  ezetimibe  (ZETIA ) 10 MG tablet [id number]  TAKE 1 TABLET BY MOUTH EVERY DAY Court Dorn PARAS, MD  Active   GEMTESA 75 MG TABS [id number]  Take 1 tablet by mouth daily. [provider]  Active   guaifenesin  (ROBITUSSIN) 100 MG/5ML syrup [id number] Yes Take 10 mLs (200 mg total) by mouth 3 (three) times daily as needed for cough. Paz, Jose E, MD  Active Spouse/Significant Other, Care Giver  hydrochlorothiazide  (HYDRODIURIL ) 25 MG tablet [id number] Yes TAKE 1 TABLET BY MOUTH EVERY DAY Domenica Harlene LABOR, MD  Active Spouse/Significant Other, Care Giver  levalbuterol  (XOPENEX  HFA) 45 MCG/ACT inhaler [id number] Yes INHALE 1 TO 2 PUFFS BY MOUTH EVERY 6 HOURS AS NEEDED FOR WHEEZE Domenica Harlene LABOR, MD  Active Spouse/Significant Other, Care Giver  Melatonin 10 MG TABS [id number]  Take 10 mg by mouth daily as needed. [provider]  Active Spouse/Significant Other, Care Giver  Multiple Vitamins-Minerals (ICAPS AREDS 2 PO) [id number] Yes Take 1 capsule by mouth in the morning and at bedtime. [provider]  Active Spouse/Significant Other, Care Giver  pantoprazole  (PROTONIX ) 40 MG tablet [id number] Yes Take 1 tablet (40 mg total) by mouth daily as needed. Daryl Setter, NP  Active Spouse/Significant Other, Care Giver  polyethylene glycol (MIRALAX  / GLYCOLAX ) 17 g packet [id number] Yes Take 17 g by mouth daily as needed for mild constipation. Bergman, Meghan D, NP  Active   senna (SENOKOT) 8.6 MG TABS tablet [id number] Yes Take 1 tablet by mouth in the morning and at bedtime. [provider]  Active Spouse/Significant Other, Care Giver  sertraline  (ZOLOFT ) 100 MG tablet [id number]  TAKE 1 TABLET BY MOUTH EVERY DAY Domenica Harlene LABOR, MD  Active   Vitamin D , Ergocalciferol , (DRISDOL ) 1.25 MG (50000 UNIT) CAPS capsule [id number] Yes Take 1 capsule (50,000 Units total) by mouth every 7 (seven) days. Domenica Harlene LABOR, MD  Active             Recommendation:   PCP Follow-up Continue Current Plan of Care  Follow Up Plan:   Telephone follow up appointment date/time:  07/01/24 2:30 pm  Katai Marsico L. Ramonita, RN, BSN, CCM [place]  [facility], [facility] RN Care Manager Direct Dial: [phone]  Fax: [phone]

## 2024-05-31 NOTE — Patient Instructions (Signed)
 Visit Information  Thank you for taking time to visit with me today. Please don't hesitate to contact me if I can be of assistance to you before our next scheduled appointment.  Your next care management appointment is by telephone on 07/01/24 at 2:30 pm    Please call the care guide team at [phone] if you need to cancel, schedule, or reschedule an appointment.   Please call the Suicide and Crisis Lifeline: 988 call the [place]  National Suicide Prevention Lifeline: 907-279-1818 or TTY: (956) 287-5891 TTY 208-279-4375) to talk to a trained counselor call 1-800-273-TALK (toll free, 24 hour hotline) go to [facility] [address] [place] [phone]) call 911 if you are experiencing a Mental Health or Behavioral Health Crisis or need someone to talk to.   Diontae Route L. Ramonita, RN, BSN, CCM   [facility], [facility] RN Care Manager Direct Dial: [phone]  Fax: [phone]

## 2024-05-31 NOTE — Progress Notes (Signed)
 Subjective:   Walter Brown is a 83 y.o. who presents for a Medicare Wellness preventive visit.  As a reminder, Annual Wellness Visits don't include a physical exam, and some assessments may be limited, especially if this visit is performed virtually. We may recommend an in-person follow-up visit with your provider if needed.  Visit Complete: Virtual I connected with  [name] on 05/31/24 by a audio enabled telemedicine application and verified that I am speaking with the correct person using two identifiers.  Patient Location: Home  Provider Location: Office/Clinic  I discussed the limitations of evaluation and management by telemedicine. The patient expressed understanding and agreed to proceed.  Vital Signs: Because this visit was a virtual/telehealth visit, some criteria may be missing or patient reported. Any vitals not documented were not able to be obtained and vitals that have been documented are patient reported.  VideoDeclined- This patient declined Librarian, academic. Therefore the visit was completed with audio only.  Persons Participating in Visit: Patient.  AWV Questionnaire: No: Patient Medicare AWV questionnaire was not completed prior to this visit.  Cardiac Risk Factors include: advanced age (>33men, >65 women);male gender;dyslipidemia;hypertension;Other (see comment), Risk factor comments: TIA, ASVD, OSA, CAC, A-fib, Parkinson's     Objective:    Today's Vitals   05/31/24 1104  Weight: 160 lb (72.6 kg)  Height: 5' 5 (1.651 m)   Body mass index is 26.63 kg/m.     05/31/2024   11:32 AM 03/19/2024   10:36 AM 03/02/2024    8:52 AM 03/01/2024    9:28 PM 01/27/2024    3:45 PM 01/16/2024    9:05 AM 01/01/2024   11:29 AM  Advanced Directives  Does Patient Have a Medical Advance Directive? Yes Yes  No Yes Yes Yes  Type of Estate agent of [place];Living will Healthcare Power of [place];Living will   Living  will Healthcare Power of [place];Living will Living will  Does patient want to make changes to medical advance directive? No - Patient declined No - Patient declined       Copy of Healthcare Power of Attorney in Chart? No - copy requested No - copy requested       Would patient like information on creating a medical advance directive?   No - Patient declined;No - Guardian declined        Current Medications (verified) Outpatient Encounter Medications as of 05/31/2024  Medication Sig   acetaminophen  (TYLENOL ) 500 MG tablet Take 2 tablets (1,000 mg total) by mouth every 6 (six) hours as needed for moderate pain (pain score 4-6).   amiodarone  (PACERONE ) 200 MG tablet TAKE 1/2 TABLET BY MOUTH DAILY   benzonatate  (TESSALON ) 100 MG capsule Take 1-2 capsules (100-200 mg total) by mouth 2 (two) times daily as needed for cough.   budesonide -formoterol  (SYMBICORT ) 160-4.5 MCG/ACT inhaler Inhale 2 puffs into the lungs 2 (two) times daily.   carbidopa -levodopa  (SINEMET  CR) 50-200 MG tablet TAKE 1 TABLET BY MOUTH EVERYDAY AT BEDTIME   carbidopa -levodopa  (SINEMET  IR) 25-100 MG tablet TAKE 2 TABLETS BY MOUTH AT 7AM, 2 TABS AT 11AM, 1 TAB AT 3PM, AND 1 TAB AT 7 PM   cholecalciferol (VITAMIN D3) 25 MCG (1000 UNIT) tablet Take 2,000 Units by mouth daily.   divalproex  (DEPAKOTE ) 125 MG DR tablet TAKE 1 TABLET (125 MG TOTAL) BY MOUTH AT BEDTIME   ezetimibe  (ZETIA ) 10 MG tablet TAKE 1 TABLET BY MOUTH EVERY DAY   GEMTESA 75 MG TABS Take  1 tablet by mouth daily.   guaifenesin  (ROBITUSSIN) 100 MG/5ML syrup Take 10 mLs (200 mg total) by mouth 3 (three) times daily as needed for cough.   hydrochlorothiazide  (HYDRODIURIL ) 25 MG tablet TAKE 1 TABLET BY MOUTH EVERY DAY   levalbuterol  (XOPENEX  HFA) 45 MCG/ACT inhaler INHALE 1 TO 2 PUFFS BY MOUTH EVERY 6 HOURS AS NEEDED FOR WHEEZE   Melatonin 10 MG TABS Take 10 mg by mouth daily as needed.   Multiple Vitamins-Minerals (ICAPS AREDS 2 PO) Take 1 capsule by mouth in the  morning and at bedtime.   pantoprazole  (PROTONIX ) 40 MG tablet Take 1 tablet (40 mg total) by mouth daily as needed.   polyethylene glycol (MIRALAX  / GLYCOLAX ) 17 g packet Take 17 g by mouth daily as needed for mild constipation.   senna (SENOKOT) 8.6 MG TABS tablet Take 1 tablet by mouth in the morning and at bedtime.   sertraline  (ZOLOFT ) 100 MG tablet TAKE 1 TABLET BY MOUTH EVERY DAY   Vitamin D , Ergocalciferol , (DRISDOL ) 1.25 MG (50000 UNIT) CAPS capsule Take 1 capsule (50,000 Units total) by mouth every 7 (seven) days.   No facility-administered encounter medications on file as of 05/31/2024.    Allergies (verified) Albuterol  sulfate, Azithromycin, and Doxycycline hyclate   History: Past Medical History:  Diagnosis Date   A-fib (HCC)    Allergy 10/07/1944   Anemia 02/07/2018   mild   Arthritis 04/06/2017   Asthma    childhood   ASVD (arteriosclerotic vascular disease) 09/07/2018   Back pain with radiation 04/26/2013   Low back with LLE radiculopathy   Basal cell carcinoma    skin- on nose- basal cell (20 yrs ago) forehead 1 year ago     Sees Dr Darice Reusing of dermatology   BPH (benign prostatic hyperplasia)    Bradycardia 06/28/2018   Cataract 10/07/2014   Cerumen impaction 08/10/2012   Chicken pox as child   Coronary artery calcification 11/23/2019   Dysphagia 09/11/2021   Elevated LFTs 11/2017   suspected drug/antibiotic induced   Essential hypertension    ETD (Eustachian tube dysfunction), right 01/17/2022   Generalized anxiety disorder    GERD (gastroesophageal reflux disease)    [place] measles as a child   Hemoptysis 01/17/2022   Hyperglycemia 04/25/2020   Hyperlipidemia    Insomnia 02/06/2021   Internal hemorrhoids    Irregular cardiac rhythm 03/10/2018   Lipoma 08/04/2018   Macular degeneration    Major depressive disorder 08/01/2012   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   Nocturia 12/04/2022   Obstructive sleep  apnea 11/09/2018   Patient reports mild symptoms; was not prescribed a CPAP machine   Otitis externa 08/10/2012   PAF (paroxysmal atrial fibrillation)    Palpitations 09/07/2018   Parkinson's disease (HCC) 05/19/2019   Recurrent falls 12/04/2022   Stroke (HCC) 02/27/2018   Thrombocytopenia    TIA (transient ischemic attack) 03/10/2018   Unsteady gait 09/11/2021   Past Surgical History:  Procedure Laterality Date   BELPHAROPTOSIS REPAIR     very young, b/l   EXCISIONAL HEMORRHOIDECTOMY     EYE SURGERY  2017   eyelids   HYDROCELE EXCISION / REPAIR  2012   b/l   SHUNT REVISION VENTRICULAR-PERITONEAL Right 03/31/2024   Procedure: REVISION, SHUNT, VENTRICULOPERITONEAL;  Surgeon: Mavis Purchase, MD;  Location: [facility] OR;  Service: Neurosurgery;  Laterality: Right;  SHUNT REVISION   SKIN CANCER EXCISION     nose and forehead, basal cell CA  UPPER GASTROINTESTINAL ENDOSCOPY     VENTRICULOPERITONEAL SHUNT Right 03/29/2024   Procedure: SHUNT INSERTION VENTRICULAR-PERITONEAL;  Surgeon: Mavis Purchase, MD;  Location: [facility] OR;  Service: Neurosurgery;  Laterality: Right;  RT VP SHUNT PLACEMENT   Family History  Problem Relation Age of Onset   Hypertension Mother    Other Mother        MRSA   Heart disease Mother        stents   ADD / ADHD Mother    Arthritis Mother    Asthma Mother    Depression Mother    Vision loss Mother    Heart disease Father    Hypertension Father    Prostate cancer Father 29   Parkinson's disease Father    Cancer Father 24   Hearing loss Father    Stroke Father    Vision loss Father    Varicose Veins Father    Stroke Maternal Grandmother    Prostate cancer Maternal Grandfather    Asthma Maternal Grandfather    Cancer Maternal Grandfather    Cancer Paternal Grandfather        EYE   Hearing loss Paternal Grandfather    ADD / ADHD Son        ADHD   Other Son 24       part of 1 lung removed- due to infection   Alcohol abuse Son    Depression Son     Alcohol abuse Son    Alcohol abuse Maternal Uncle    Depression Maternal Uncle    Asthma Maternal Uncle    Heart disease Paternal Uncle    Colon cancer Neg Hx    Esophageal cancer Neg Hx    Rectal cancer Neg Hx    Stomach cancer Neg Hx    Social History   Socioeconomic History   Marital status: Married    Spouse name: Not on file   Number of children: 2   Years of education: 16   Highest education level: Bachelor's degree (e.g., BA, AB, BS)  Occupational History   Occupation: Retired  Tobacco Use   Smoking status: Former    Current packs/day: 0.00    Average packs/day: 1.5 packs/day for 15.0 years (22.5 ttl pk-yrs)    Types: Cigarettes    Start date: 10/06/1978    Quit date: 10/07/1978    Years since quitting: 45.6   Smokeless tobacco: Never   Tobacco comments:    QUIT 1980  Vaping Use   Vaping status: Never Used  Substance and Sexual Activity   Alcohol use: Yes    Alcohol/week: 7.0 standard drinks of alcohol    Types: 7 Glasses of wine per week    Comment: wine with meals   Drug use: Not Currently   Sexual activity: Yes    Comment: lives with wife, 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  Other Topics Concern   Not on file  Social History Narrative   Right handed    Occasionally caffeine   Retired   Lives with wife   Social Drivers of Corporate investment banker Strain: Low Risk  (05/31/2024)   Overall Financial Resource Strain (CARDIA)    Difficulty of Paying Living Expenses: Not very hard  Food Insecurity: No Food Insecurity (05/31/2024)   Hunger Vital Sign    Worried About Running Out of Food in the Last Year: Never true    Ran Out of Food in the Last Year: Never true  Transportation Needs: No Transportation Needs (05/31/2024)   PRAPARE - Administrator, Civil Service (Medical): No    Lack of Transportation (Non-Medical): No  Physical Activity: Sufficiently Active (05/31/2024)   Exercise Vital Sign     Days of Exercise per Week: 5 days    Minutes of Exercise per Session: 50 min  Stress: No Stress Concern Present (05/31/2024)   [employer] of Occupational Health - Occupational Stress Questionnaire    Feeling of Stress: Not at all  Social Connections: Moderately Isolated (05/31/2024)   Social Connection and Isolation Panel    Frequency of Communication with Friends and Family: More than three times a week    Frequency of Social Gatherings with Friends and Family: Once a week    Attends Religious Services: Never    Database administrator or Organizations: No    Attends Engineer, structural: Never    Marital Status: Married    Tobacco Counseling Counseling given: Not Answered Tobacco comments: QUIT 1980    Clinical Intake:  Pre-visit preparation completed: Yes  Pain : No/denies pain     BMI - recorded: 26.63 Nutritional Status: BMI 25 -29 Overweight Nutritional Risks: None Diabetes: No  Lab Results  Component Value Date   HGBA1C 5.4 04/08/2024   HGBA1C 5.2 02/04/2024   HGBA1C 5.4 10/30/2023     How often do you need to have someone help you when you read instructions, pamphlets, or other written materials from your doctor or pharmacy?: 1 - Never What is the last grade level you completed in school?: Bachelor's degree  Interpreter Needed?: No  Information entered by :: Lolita Libra, CMA   Activities of Daily Living     05/31/2024   11:17 AM 03/19/2024   10:39 AM  In your present state of health, do you have any difficulty performing the following activities:  Hearing? 1   Comment wears hearing aids   Vision? 0   Difficulty concentrating or making decisions? 1   Walking or climbing stairs? 1   Dressing or bathing? 1   Doing errands, shopping? 1 0  Preparing Food and eating ? Y   Using the Toilet? N   In the past six months, have you accidently leaked urine? Y   Comment Sees Dr Cam   Do you have problems with loss of bowel control? N    Managing your Medications? Y   Managing your Finances? Y   Housekeeping or managing your Housekeeping? Y     Patient Care Team: Domenica Harlene LABOR, MD as PCP - General (Family Medicine) Court Dorn PARAS, MD as PCP - Cardiology (Cardiology) [place], Tawni CROME, MD as Referring Physician (Dermatology) Leslee Reusing, MD as Consulting Physician (Ophthalmology) Cam Morene ORN, MD as Consulting Physician (Urology) Tat, Asberry RAMAN, DO as Consulting Physician (Neurology) Mavis Purchase, MD as Consulting Physician (Neurosurgery) Ramonita Suzen CROME, RN as VBCI Care Management  I have updated your Care Teams any recent Medical Services you may have received from other providers in the past year.     Assessment:   This is a routine wellness examination for [place].  Hearing/Vision screen Hearing Screening - Comments:: Wears hearing aids Vision Screening - Comments:: Up to date with eye exam with Dr Raj.   Goals Addressed   None    Depression Screen     05/31/2024   11:32 AM 05/24/2024   11:42 AM 05/14/2024    2:34 PM 03/16/2024    3:33 PM 12/05/2023  1:47 PM 04/01/2023    1:09 PM 03/11/2023   11:30 AM  PHQ 2/9 Scores  PHQ - 2 Score 0 0 0 0 4 0 0  PHQ- 9 Score  0 0 0 14  0    Fall Risk     05/31/2024   11:07 AM 05/24/2024   11:42 AM 05/14/2024    2:34 PM 04/28/2024    2:14 PM 04/13/2024    3:24 PM  Fall Risk   Falls in the past year? 0 0 0 0 0  Number falls in past yr: 0 0 0    Injury with Fall? 0  0    Risk for fall due to : Impaired balance/gait;Other (Comment)  No Fall Risks    Risk for fall due to: Comment Parkinson's      Follow up Education provided Falls evaluation completed Falls evaluation completed      MEDICARE RISK AT HOME:  Medicare Risk at Home Any stairs in or around the home?: Yes If so, are there any without handrails?: No Home free of loose throw rugs in walkways, pet beds, electrical cords, etc?: Yes Adequate lighting in your home to reduce risk  of falls?: Yes Life alert?: No Use of a cane, walker or w/c?: No (motorized chair, walker, cane) Grab bars in the bathroom?: Yes Shower chair or bench in shower?: Yes Elevated toilet seat or a handicapped toilet?: Yes (has bedside commode)  TIMED UP AND GO:  Was the test performed?  No,audio  Cognitive Function: 6CIT completed    03/05/2016   10:01 AM  MMSE - Mini Mental State Exam  Orientation to time 5   Orientation to Place 5   Registration 3   Attention/ Calculation 5   Recall 3   Language- name 2 objects 2   Language- repeat 1  Language- follow 3 step command 3   Language- read & follow direction 1   Write a sentence 1   Copy design 1   Total score 30      Data saved with a previous flowsheet row definition      08/13/2022    8:00 AM  Montreal Cognitive Assessment   Visuospatial/ Executive (0/5) 4  Naming (0/3) 3  Attention: Read list of digits (0/2) 2  Attention: Read list of letters (0/1) 1  Attention: Serial 7 subtraction starting at 100 (0/3) 1  Language: Repeat phrase (0/2) 1  Language : Fluency (0/1) 1  Abstraction (0/2) 2  Delayed Recall (0/5) 3  Orientation (0/6) 6  Total 24  Adjusted Score (based on education) 24      05/31/2024   11:13 AM 04/01/2023    1:10 PM 03/26/2022    2:05 PM 10/11/2019    9:19 AM  6CIT Screen  What Year? 0 points 0 points 0 points 0 points  What month? 0 points 0 points 0 points 0 points  What time? 3 points 3 points 0 points 0 points  Count back from 20 0 points 0 points 0 points 0 points  Months in reverse 2 points 0 points 2 points 0 points  Repeat phrase 0 points 4 points 4 points 2 points  Total Score 5 points 7 points 6 points 2 points    Immunizations Immunization History  Administered Date(s) Administered   Fluad Quad(high Dose 65+) 06/07/2022   INFLUENZA, HIGH DOSE SEASONAL PF 07/08/2017, 05/19/2019, 06/22/2020, 06/18/2021, 06/04/2023   Influenza Split 07/02/2012   Influenza Whole 06/07/2013   Influenza,  Mdck, Trivalent,PF  6+ MOS(egg free) 07/11/2015   Influenza,inj,Quad PF,6+ Mos 06/21/2014   Influenza-Unspecified 06/07/2016, 07/08/2017, 05/26/2018   PFIZER Comirnaty(Gray Top)Covid-19 Tri-Sucrose Vaccine 01/09/2021   PFIZER(Purple Top)SARS-COV-2 Vaccination 10/20/2019, 11/10/2019, 06/29/2020   Pfizer Covid-19 Vaccine Bivalent Booster 28yrs & up 06/18/2021, 02/12/2022   Pneumococcal Conjugate-13 11/04/2013   Pneumococcal Polysaccharide-23 10/08/2007, 08/04/2020   Respiratory Syncytial Virus Vaccine,Recomb Aduvanted(Arexvy) 06/07/2022   Tdap 11/04/2013, 11/04/2023   Unspecified SARS-COV-2 Vaccination 07/16/2022   Zoster Recombinant(Shingrix) 05/26/2018, 11/05/2018   Zoster, Live 10/07/1998, 01/07/2008    Screening Tests Health Maintenance  Topic Date Due   Medicare Annual Wellness (AWV)  03/31/2024   INFLUENZA VACCINE  06/08/2024 (Originally 05/07/2024)   COVID-19 Vaccine (8 - 2024-25 season) 10/06/2024 (Originally 06/08/2023)   DTaP/Tdap/Td (3 - Td or Tdap) 11/03/2033   Pneumococcal Vaccine: 50+ Years  Completed   Zoster Vaccines- Shingrix  Completed   HPV VACCINES  Aged Out   Meningococcal B Vaccine  Aged Out   Hepatitis C Screening  Discontinued    Health Maintenance  Health Maintenance Due  Topic Date Due   Medicare Annual Wellness (AWV)  03/31/2024   Health Maintenance Items Addressed: All HM up to date  Additional Screening:  Vision Screening: Recommended annual ophthalmology exams for early detection of glaucoma and other disorders of the eye. Would you like a referral to an eye doctor? No    Dental Screening: Recommended annual dental exams for proper oral hygiene  Community Resource Referral / Chronic Care Management: CRR required this visit?  No   CCM required this visit?  No   Plan:    I have personally reviewed and noted the following in the patient's chart:   Medical and social history Use of alcohol, tobacco or illicit drugs  Current medications and  supplements including opioid prescriptions. Patient is not currently taking opioid prescriptions. Functional ability and status Nutritional status Physical activity Advanced directives List of other physicians Hospitalizations, surgeries, and ER visits in previous 12 months Vitals Screenings to include cognitive, depression, and falls Referrals and appointments  In addition, I have reviewed and discussed with patient certain preventive protocols, quality metrics, and best practice recommendations. A written personalized care plan for preventive services as well as general preventive health recommendations were provided to patient.   Lolita Libra, CMA   05/31/2024   After Visit Summary: (MyChart) Due to this being a telephonic visit, the after visit summary with patients personalized plan was offered to patient via MyChart   Notes: Nothing significant to report at this time.

## 2024-05-31 NOTE — Patient Instructions (Signed)
 Walter Brown - I am sorry I was unable to reach you today for our scheduled appointment. I work with Domenica Harlene LABOR, MD and am calling to support your healthcare needs. Please contact me at [phone] at your earliest convenience. I look forward to speaking with you soon.   Thank you,  Suzen L. Ramonita, RN, BSN, CCM [place]  [facility] RN Care Manager Direct Dial: [phone]  Fax: [phone]

## 2024-06-01 ENCOUNTER — Telehealth: Payer: Self-pay

## 2024-06-01 DIAGNOSIS — R001 Bradycardia, unspecified: Secondary | ICD-10-CM | POA: Diagnosis not present

## 2024-06-01 DIAGNOSIS — I10 Essential (primary) hypertension: Secondary | ICD-10-CM | POA: Diagnosis not present

## 2024-06-01 DIAGNOSIS — D630 Anemia in neoplastic disease: Secondary | ICD-10-CM | POA: Diagnosis not present

## 2024-06-01 DIAGNOSIS — G912 (Idiopathic) normal pressure hydrocephalus: Secondary | ICD-10-CM | POA: Diagnosis not present

## 2024-06-01 DIAGNOSIS — G20A1 Parkinson's disease without dyskinesia, without mention of fluctuations: Secondary | ICD-10-CM | POA: Diagnosis not present

## 2024-06-01 DIAGNOSIS — D179 Benign lipomatous neoplasm, unspecified: Secondary | ICD-10-CM | POA: Diagnosis not present

## 2024-06-01 DIAGNOSIS — I480 Paroxysmal atrial fibrillation: Secondary | ICD-10-CM | POA: Diagnosis not present

## 2024-06-01 DIAGNOSIS — I2510 Atherosclerotic heart disease of native coronary artery without angina pectoris: Secondary | ICD-10-CM | POA: Diagnosis not present

## 2024-06-01 DIAGNOSIS — G3184 Mild cognitive impairment, so stated: Secondary | ICD-10-CM | POA: Diagnosis not present

## 2024-06-01 NOTE — Telephone Encounter (Signed)
 Copied from CRM #[id number]. Topic: General - Other >> Jun 01, 2024  1:37 PM Robinson H wrote: Reason for CRM: [facility] just calling to advise provider that they will pick up patient for physical therapy 2 times a week for 5 weeks for balance/fall prevention, required to notify provider already has order.  [facility] Physical Therapist [phone]

## 2024-06-03 DIAGNOSIS — R001 Bradycardia, unspecified: Secondary | ICD-10-CM | POA: Diagnosis not present

## 2024-06-03 DIAGNOSIS — M1990 Unspecified osteoarthritis, unspecified site: Secondary | ICD-10-CM

## 2024-06-03 DIAGNOSIS — D630 Anemia in neoplastic disease: Secondary | ICD-10-CM | POA: Diagnosis not present

## 2024-06-03 DIAGNOSIS — I2510 Atherosclerotic heart disease of native coronary artery without angina pectoris: Secondary | ICD-10-CM

## 2024-06-03 DIAGNOSIS — E782 Mixed hyperlipidemia: Secondary | ICD-10-CM

## 2024-06-03 DIAGNOSIS — G3184 Mild cognitive impairment, so stated: Secondary | ICD-10-CM | POA: Diagnosis not present

## 2024-06-03 DIAGNOSIS — I10 Essential (primary) hypertension: Secondary | ICD-10-CM

## 2024-06-03 DIAGNOSIS — M545 Low back pain, unspecified: Secondary | ICD-10-CM

## 2024-06-03 DIAGNOSIS — J45909 Unspecified asthma, uncomplicated: Secondary | ICD-10-CM | POA: Diagnosis not present

## 2024-06-03 DIAGNOSIS — G20A1 Parkinson's disease without dyskinesia, without mention of fluctuations: Secondary | ICD-10-CM | POA: Diagnosis not present

## 2024-06-03 DIAGNOSIS — D179 Benign lipomatous neoplasm, unspecified: Secondary | ICD-10-CM | POA: Diagnosis not present

## 2024-06-03 DIAGNOSIS — G912 (Idiopathic) normal pressure hydrocephalus: Secondary | ICD-10-CM | POA: Diagnosis not present

## 2024-06-03 DIAGNOSIS — I480 Paroxysmal atrial fibrillation: Secondary | ICD-10-CM | POA: Diagnosis not present

## 2024-06-08 DIAGNOSIS — G20A1 Parkinson's disease without dyskinesia, without mention of fluctuations: Secondary | ICD-10-CM | POA: Diagnosis not present

## 2024-06-08 DIAGNOSIS — N401 Enlarged prostate with lower urinary tract symptoms: Secondary | ICD-10-CM | POA: Diagnosis not present

## 2024-06-08 DIAGNOSIS — D630 Anemia in neoplastic disease: Secondary | ICD-10-CM | POA: Diagnosis not present

## 2024-06-08 DIAGNOSIS — I10 Essential (primary) hypertension: Secondary | ICD-10-CM | POA: Diagnosis not present

## 2024-06-08 DIAGNOSIS — R001 Bradycardia, unspecified: Secondary | ICD-10-CM | POA: Diagnosis not present

## 2024-06-08 DIAGNOSIS — G912 (Idiopathic) normal pressure hydrocephalus: Secondary | ICD-10-CM | POA: Diagnosis not present

## 2024-06-08 DIAGNOSIS — N3941 Urge incontinence: Secondary | ICD-10-CM | POA: Diagnosis not present

## 2024-06-08 DIAGNOSIS — G3184 Mild cognitive impairment, so stated: Secondary | ICD-10-CM | POA: Diagnosis not present

## 2024-06-08 DIAGNOSIS — R3914 Feeling of incomplete bladder emptying: Secondary | ICD-10-CM | POA: Diagnosis not present

## 2024-06-08 DIAGNOSIS — I2510 Atherosclerotic heart disease of native coronary artery without angina pectoris: Secondary | ICD-10-CM | POA: Diagnosis not present

## 2024-06-08 DIAGNOSIS — I480 Paroxysmal atrial fibrillation: Secondary | ICD-10-CM | POA: Diagnosis not present

## 2024-06-08 DIAGNOSIS — D179 Benign lipomatous neoplasm, unspecified: Secondary | ICD-10-CM | POA: Diagnosis not present

## 2024-06-09 DIAGNOSIS — L219 Seborrheic dermatitis, unspecified: Secondary | ICD-10-CM | POA: Diagnosis not present

## 2024-06-09 DIAGNOSIS — L570 Actinic keratosis: Secondary | ICD-10-CM | POA: Diagnosis not present

## 2024-06-09 DIAGNOSIS — L819 Disorder of pigmentation, unspecified: Secondary | ICD-10-CM | POA: Diagnosis not present

## 2024-06-10 DIAGNOSIS — G20A1 Parkinson's disease without dyskinesia, without mention of fluctuations: Secondary | ICD-10-CM | POA: Diagnosis not present

## 2024-06-10 DIAGNOSIS — I2510 Atherosclerotic heart disease of native coronary artery without angina pectoris: Secondary | ICD-10-CM | POA: Diagnosis not present

## 2024-06-10 DIAGNOSIS — G912 (Idiopathic) normal pressure hydrocephalus: Secondary | ICD-10-CM | POA: Diagnosis not present

## 2024-06-10 DIAGNOSIS — D179 Benign lipomatous neoplasm, unspecified: Secondary | ICD-10-CM | POA: Diagnosis not present

## 2024-06-10 DIAGNOSIS — G3184 Mild cognitive impairment, so stated: Secondary | ICD-10-CM | POA: Diagnosis not present

## 2024-06-10 DIAGNOSIS — I480 Paroxysmal atrial fibrillation: Secondary | ICD-10-CM | POA: Diagnosis not present

## 2024-06-10 DIAGNOSIS — I10 Essential (primary) hypertension: Secondary | ICD-10-CM | POA: Diagnosis not present

## 2024-06-10 DIAGNOSIS — R001 Bradycardia, unspecified: Secondary | ICD-10-CM | POA: Diagnosis not present

## 2024-06-10 DIAGNOSIS — D630 Anemia in neoplastic disease: Secondary | ICD-10-CM | POA: Diagnosis not present

## 2024-06-17 ENCOUNTER — Ambulatory Visit: Admitting: Physician Assistant

## 2024-06-17 ENCOUNTER — Telehealth: Payer: Self-pay

## 2024-06-17 DIAGNOSIS — G20A1 Parkinson's disease without dyskinesia, without mention of fluctuations: Secondary | ICD-10-CM | POA: Diagnosis not present

## 2024-06-17 DIAGNOSIS — G912 (Idiopathic) normal pressure hydrocephalus: Secondary | ICD-10-CM | POA: Diagnosis not present

## 2024-06-17 DIAGNOSIS — I480 Paroxysmal atrial fibrillation: Secondary | ICD-10-CM | POA: Diagnosis not present

## 2024-06-17 DIAGNOSIS — D630 Anemia in neoplastic disease: Secondary | ICD-10-CM | POA: Diagnosis not present

## 2024-06-17 DIAGNOSIS — I2510 Atherosclerotic heart disease of native coronary artery without angina pectoris: Secondary | ICD-10-CM | POA: Diagnosis not present

## 2024-06-17 DIAGNOSIS — R001 Bradycardia, unspecified: Secondary | ICD-10-CM | POA: Diagnosis not present

## 2024-06-17 DIAGNOSIS — D179 Benign lipomatous neoplasm, unspecified: Secondary | ICD-10-CM | POA: Diagnosis not present

## 2024-06-17 DIAGNOSIS — G3184 Mild cognitive impairment, so stated: Secondary | ICD-10-CM | POA: Diagnosis not present

## 2024-06-17 DIAGNOSIS — I10 Essential (primary) hypertension: Secondary | ICD-10-CM | POA: Diagnosis not present

## 2024-06-17 NOTE — Telephone Encounter (Signed)
 Copied from CRM [id number]. Topic: Clinical - Home Health Verbal Orders >> Jun 17, 2024  1:29 PM Burnard DEL wrote: Caller/Agency: [facility] Callback Number: [phone]  Any new concerns about the patient? Yes Patient had fall. Patient is fine.Wanted to notify provider

## 2024-06-24 DIAGNOSIS — D630 Anemia in neoplastic disease: Secondary | ICD-10-CM | POA: Diagnosis not present

## 2024-06-24 DIAGNOSIS — G3184 Mild cognitive impairment, so stated: Secondary | ICD-10-CM | POA: Diagnosis not present

## 2024-06-24 DIAGNOSIS — D179 Benign lipomatous neoplasm, unspecified: Secondary | ICD-10-CM | POA: Diagnosis not present

## 2024-06-24 DIAGNOSIS — R001 Bradycardia, unspecified: Secondary | ICD-10-CM | POA: Diagnosis not present

## 2024-06-24 DIAGNOSIS — I480 Paroxysmal atrial fibrillation: Secondary | ICD-10-CM | POA: Diagnosis not present

## 2024-06-24 DIAGNOSIS — I10 Essential (primary) hypertension: Secondary | ICD-10-CM | POA: Diagnosis not present

## 2024-06-24 DIAGNOSIS — I2510 Atherosclerotic heart disease of native coronary artery without angina pectoris: Secondary | ICD-10-CM | POA: Diagnosis not present

## 2024-06-24 DIAGNOSIS — G20A1 Parkinson's disease without dyskinesia, without mention of fluctuations: Secondary | ICD-10-CM | POA: Diagnosis not present

## 2024-06-24 DIAGNOSIS — G912 (Idiopathic) normal pressure hydrocephalus: Secondary | ICD-10-CM | POA: Diagnosis not present

## 2024-06-25 ENCOUNTER — Ambulatory Visit: Admitting: Neurology

## 2024-06-29 DIAGNOSIS — I10 Essential (primary) hypertension: Secondary | ICD-10-CM | POA: Diagnosis not present

## 2024-06-29 DIAGNOSIS — I480 Paroxysmal atrial fibrillation: Secondary | ICD-10-CM | POA: Diagnosis not present

## 2024-06-29 DIAGNOSIS — G3184 Mild cognitive impairment, so stated: Secondary | ICD-10-CM | POA: Diagnosis not present

## 2024-06-29 DIAGNOSIS — I2510 Atherosclerotic heart disease of native coronary artery without angina pectoris: Secondary | ICD-10-CM | POA: Diagnosis not present

## 2024-06-29 DIAGNOSIS — R001 Bradycardia, unspecified: Secondary | ICD-10-CM | POA: Diagnosis not present

## 2024-06-29 DIAGNOSIS — G912 (Idiopathic) normal pressure hydrocephalus: Secondary | ICD-10-CM | POA: Diagnosis not present

## 2024-06-29 DIAGNOSIS — D179 Benign lipomatous neoplasm, unspecified: Secondary | ICD-10-CM | POA: Diagnosis not present

## 2024-06-29 DIAGNOSIS — G20A1 Parkinson's disease without dyskinesia, without mention of fluctuations: Secondary | ICD-10-CM | POA: Diagnosis not present

## 2024-06-29 DIAGNOSIS — D630 Anemia in neoplastic disease: Secondary | ICD-10-CM | POA: Diagnosis not present

## 2024-06-29 NOTE — Progress Notes (Unsigned)
 Assessment/Plan:   1.  Parkinsons Disease  -DaT scan abnormal in the past  -continue  carbidopa /levodopa , 2 at 7am, 2 at 11am, 1 at 3pm, 7pm  -continue carbidopa /levodopa  50/200 at bed   -Patient had 2nd opinion at [facility] in 02/2023 and dx was agreed upon  -only walk with someone directly walking with him with gait belt and walker.  Otherwise, no walking  -he has a scooter and an electric wheelchair but I want him to get rid of the scooter.   -they plan to access his LTC insurance and I support that   -he asks about DBS.  Don't think likely good candidate but he is also well treated medically and dbs doesn't help falls (his biggest issue).  We will see what neurocog testing shows in June  2.  Memory change  -Patient with evidence of MCI on neurocog testing in 12/2022.  Suspect PDD now.  He is scheduled for repeat testing in June.  -Patient used to like to read and he is not doing it any longer.  We talked about trying to write his life story with the help of his caregivers.  - He is currently on Depakote , 125 mg bid.    3.  Depression  -Primary care prescribing sertraline , 100 mg daily.    4.  Urinary incontinenece  -follows with [facility].  5.  Incidental infarct, 04/2023  -This was completely asymptomatic and noted on MRI of the brain that was done by our PA for many change  -MRA head and neck was unremarkable.   6.  Atrial fibrillation  -Dr. Court has stopped his Eliquis  because of a number of falls.  7.  Ventriculomegaly/possible NPH  -Patient has prominence of the ventricles.  Whether this is due to atrophy or not has been up for debate.  His ventricle size has been stable for a long time and most of the radiologists felt that the issue was due to atrophy but the most recent radiologist felt that ventricles were out of proportion to atrophy.  Ultimately, patient decided to have high-volume lumbar puncture followed by consultation with neurosurgery and had shunt placed for  possible NPH on March 29, 2024.  Postoperative imaging indicated that the shunt was not well-positioned initially and it had to be repositioned with a second surgery 2 days later.  - Patient reports improvement in gait since surgery Subjective:   Walter Brown was seen today in follow up for Parkinsons disease.  My previous records were reviewed prior to todays visit as well as outside records available to me.  Wife and son accompanies the patient and supplements the history.  After our last visit, the patient visited neurosurgery, Dr. Mavis, and with his consultation, the family opted to proceed with shunt for possible NPH.  This was done June 23.  However, postoperative imaging indicated that the shunt was not well-positioned and it had to be repositioned with a second surgery 2 days later.  He reports improvement in gait since surgery.   Current prescribed movement disorder medications: Carbidopa /levodopa  25/100, 2 at 7am, 2 at 11am, 1 at 3pm, 7pm (increased) Carbidopa /levodopa  50/200 at bedtime   ALLERGIES:   Allergies  Allergen Reactions   Albuterol  Sulfate Palpitations   Azithromycin Other (See Comments)    Hepatotoxicity   Doxycycline Hyclate Other (See Comments)    Taken with Azithromycin and had Heaptotoxicity    CURRENT MEDICATIONS:  Outpatient Encounter Medications as of 07/01/2024  Medication Sig   acetaminophen  (TYLENOL )  500 MG tablet Take 2 tablets (1,000 mg total) by mouth every 6 (six) hours as needed for moderate pain (pain score 4-6).   amiodarone  (PACERONE ) 200 MG tablet TAKE 1/2 TABLET BY MOUTH DAILY   benzonatate  (TESSALON ) 100 MG capsule Take 1-2 capsules (100-200 mg total) by mouth 2 (two) times daily as needed for cough.   budesonide -formoterol  (SYMBICORT ) 160-4.5 MCG/ACT inhaler Inhale 2 puffs into the lungs 2 (two) times daily.   carbidopa -levodopa  (SINEMET  CR) 50-200 MG tablet TAKE 1 TABLET BY MOUTH EVERYDAY AT BEDTIME   carbidopa -levodopa  (SINEMET  IR) 25-100  MG tablet TAKE 2 TABLETS BY MOUTH AT 7AM, 2 TABS AT 11AM, 1 TAB AT 3PM, AND 1 TAB AT 7 PM   cholecalciferol (VITAMIN D3) 25 MCG (1000 UNIT) tablet Take 2,000 Units by mouth daily.   divalproex  (DEPAKOTE ) 125 MG DR tablet TAKE 1 TABLET (125 MG TOTAL) BY MOUTH AT BEDTIME   ezetimibe  (ZETIA ) 10 MG tablet TAKE 1 TABLET BY MOUTH EVERY DAY   GEMTESA 75 MG TABS Take 1 tablet by mouth daily.   guaifenesin  (ROBITUSSIN) 100 MG/5ML syrup Take 10 mLs (200 mg total) by mouth 3 (three) times daily as needed for cough.   hydrochlorothiazide  (HYDRODIURIL ) 25 MG tablet TAKE 1 TABLET BY MOUTH EVERY DAY   levalbuterol  (XOPENEX  HFA) 45 MCG/ACT inhaler INHALE 1 TO 2 PUFFS BY MOUTH EVERY 6 HOURS AS NEEDED FOR WHEEZE   Melatonin 10 MG TABS Take 10 mg by mouth daily as needed.   Multiple Vitamins-Minerals (ICAPS AREDS 2 PO) Take 1 capsule by mouth in the morning and at bedtime.   pantoprazole  (PROTONIX ) 40 MG tablet Take 1 tablet (40 mg total) by mouth daily as needed.   polyethylene glycol (MIRALAX  / GLYCOLAX ) 17 g packet Take 17 g by mouth daily as needed for mild constipation.   senna (SENOKOT) 8.6 MG TABS tablet Take 1 tablet by mouth in the morning and at bedtime.   sertraline  (ZOLOFT ) 100 MG tablet TAKE 1 TABLET BY MOUTH EVERY DAY   Vitamin D , Ergocalciferol , (DRISDOL ) 1.25 MG (50000 UNIT) CAPS capsule Take 1 capsule (50,000 Units total) by mouth every 7 (seven) days.   No facility-administered encounter medications on file as of 07/01/2024.    Objective:   PHYSICAL EXAMINATION:    VITALS:   There were no vitals filed for this visit.  Virtually 100% of visit in counseling today  GEN:  The patient appears stated age and is in NAD.   Orientation: The patient is alert and oriented x3.   Cranial nerves: There is good facial symmetry with facial hypomimia. The speech is fluent and clear but hypophonic.   I have reviewed and interpreted the following labs independently    Chemistry      Component Value  Date/Time   NA 141 03/08/2024 1239   NA 140 07/09/2023 1236   K 3.6 03/08/2024 1239   CL 102 03/08/2024 1239   CO2 30 03/08/2024 1239   BUN 12 03/08/2024 1239   BUN 17 07/09/2023 1236   CREATININE 0.91 03/08/2024 1239   CREATININE 1.16 10/20/2020 1426      Component Value Date/Time   CALCIUM  9.7 03/08/2024 1239   ALKPHOS 57 03/08/2024 1239   AST 16 03/08/2024 1239   ALT 10 03/08/2024 1239   BILITOT 0.6 03/08/2024 1239   BILITOT 0.6 07/09/2023 1236       Lab Results  Component Value Date   WBC 8.1 03/08/2024   HGB 13.5 03/08/2024   HCT  39.9 03/08/2024   MCV 94.1 03/08/2024   PLT 224.0 03/08/2024    Lab Results  Component Value Date   TSH 1.74 02/04/2024     Total time spent on today's visit was *** minutes, including both face-to-face time and nonface-to-face time.  Time included that spent on review of records (prior notes available to me/labs/imaging if pertinent), discussing treatment and goals, answering patient's questions and coordinating care.  Cc:  Domenica Harlene LABOR, MD

## 2024-07-01 ENCOUNTER — Other Ambulatory Visit: Payer: Self-pay | Admitting: *Deleted

## 2024-07-01 ENCOUNTER — Telehealth: Payer: Self-pay | Admitting: Family Medicine

## 2024-07-01 ENCOUNTER — Ambulatory Visit: Admitting: Neurology

## 2024-07-01 ENCOUNTER — Encounter: Payer: Self-pay | Admitting: Neurology

## 2024-07-01 VITALS — BP 126/72 | HR 68 | Wt 160.0 lb

## 2024-07-01 DIAGNOSIS — R001 Bradycardia, unspecified: Secondary | ICD-10-CM | POA: Diagnosis not present

## 2024-07-01 DIAGNOSIS — I10 Essential (primary) hypertension: Secondary | ICD-10-CM | POA: Diagnosis not present

## 2024-07-01 DIAGNOSIS — R413 Other amnesia: Secondary | ICD-10-CM

## 2024-07-01 DIAGNOSIS — G912 (Idiopathic) normal pressure hydrocephalus: Secondary | ICD-10-CM | POA: Diagnosis not present

## 2024-07-01 DIAGNOSIS — I2510 Atherosclerotic heart disease of native coronary artery without angina pectoris: Secondary | ICD-10-CM | POA: Diagnosis not present

## 2024-07-01 DIAGNOSIS — G3184 Mild cognitive impairment, so stated: Secondary | ICD-10-CM | POA: Diagnosis not present

## 2024-07-01 DIAGNOSIS — G20A2 Parkinson's disease without dyskinesia, with fluctuations: Secondary | ICD-10-CM | POA: Diagnosis not present

## 2024-07-01 DIAGNOSIS — G20A1 Parkinson's disease without dyskinesia, without mention of fluctuations: Secondary | ICD-10-CM | POA: Diagnosis not present

## 2024-07-01 DIAGNOSIS — D630 Anemia in neoplastic disease: Secondary | ICD-10-CM | POA: Diagnosis not present

## 2024-07-01 DIAGNOSIS — I480 Paroxysmal atrial fibrillation: Secondary | ICD-10-CM | POA: Diagnosis not present

## 2024-07-01 DIAGNOSIS — D179 Benign lipomatous neoplasm, unspecified: Secondary | ICD-10-CM | POA: Diagnosis not present

## 2024-07-01 MED ORDER — CARBIDOPA-LEVODOPA 25-100 MG PO TABS: ORAL_TABLET | ORAL | 1 refills | Status: AC

## 2024-07-01 MED ORDER — CARBIDOPA-LEVODOPA ER 50-200 MG PO TBCR: 1.0000 | EXTENDED_RELEASE_TABLET | Freq: Every day | ORAL | 1 refills | Status: AC

## 2024-07-01 NOTE — Telephone Encounter (Signed)
 Copied from CRM [id number]. Topic: Referral - Request for Referral >> Jul 01, 2024  2:42 PM Corin V wrote: Did the patient discuss referral with their provider in the last year? Yes (If No - schedule appointment) (If Yes - send message)  Appointment offered? No  Type of order/referral and detailed reason for visit: Dermatology, questionable spots on head  Preference of office, provider, location: Dr. Tricia on [address]  If referral order, have you been seen by this specialty before? Yes (If Yes, this issue or another issue? When? Where?) Been seeing Dr. Tricia previously  Can we respond through MyChart? No- [phone]

## 2024-07-01 NOTE — Patient Outreach (Signed)
 Complex Care Management   Visit Note  07/15/2024  Name:  Walter Brown MRN: [id number] DOB: 1941-07-15  Situation: Referral received for Complex Care Management related to parkinson, recurrent falls hydrocephalus I obtained verbal consent from Caregiver.  Visit completed with Caregiver  on the phone   Terrilee recently on deck no injury    Need hearing aide consult + poor vision related to cataract  Needs a referral to dermatology related to places on leg  Saw  neurology  today - managing parkinson  At night bedside commode is used incontinence at night  Wife states he is using a pole at night  to get to the commode and generally goes about 3 times  Bladder & bowel elimination issues   Background:   Past Medical History:  Diagnosis Date   A-fib (HCC)    Allergy 10[birth year]07/1944   Anemia 02/07/2018   mild   Arthritis 04/06/2017   Asthma    childhood   ASVD (arteriosclerotic vascular disease) 09/07/2018   Back pain with radiation 04/26/2013   Low back with LLE radiculopathy   Basal cell carcinoma    skin- on nose- basal cell (20 yrs ago) forehead 1 year ago     Sees Dr Darice Reusing of dermatology   BPH (benign prostatic hyperplasia)    Bradycardia 06/28/2018   Cataract 10/07/2014   Cerumen impaction 08/10/2012   Chicken pox as child   Coronary artery calcification 11/23/2019   Dysphagia 09/11/2021   Elevated LFTs 11/2017   suspected drug/antibiotic induced   Essential hypertension    ETD (Eustachian tube dysfunction), right 01/17/2022   Generalized anxiety disorder    GERD (gastroesophageal reflux disease)    [place] measles as a child   Hemoptysis 01/17/2022   Hyperglycemia 04/25/2020   Hyperlipidemia    Insomnia 02/06/2021   Internal hemorrhoids    Irregular cardiac rhythm 03/10/2018   Lipoma 08/04/2018   Macular degeneration    Major depressive disorder 08/01/2012   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   Nocturia 12/04/2022    Obstructive sleep apnea 11/09/2018   Patient reports mild symptoms; was not prescribed a CPAP machine   Otitis externa 08/10/2012   PAF (paroxysmal atrial fibrillation)    Palpitations 09/07/2018   Parkinson's disease (HCC) 05/19/2019   Recurrent falls 12/04/2022   Stroke (HCC) 02/27/2018   Thrombocytopenia    TIA (transient ischemic attack) 03/10/2018   Unsteady gait 09/11/2021    Assessment: Patient Reported Symptoms:  Cognitive Cognitive Status: No symptoms reported      Neurological Neurological Review of Symptoms: Weakness, Other: [place] Neurological Symptoms/Conditions [RPT]: parkinson falls Neurological Management Strategies: Coping strategies, Adequate rest, Medication therapy, Routine screening Neurological Self-Management Outcome: 3 (uncertain)  HEENT HEENT Symptoms Reported: Sudden change or loss of vision HEENT Management Strategies: Medical device, Medication therapy, Routine screening HEENT Self-Management Outcome: 3 (uncertain) Vision problem(s)  Cardiovascular      Respiratory Respiratory Symptoms Reported: No symptoms reported Respiratory Management Strategies: Routine screening Respiratory Self-Management Outcome: 4 (good)  Endocrine Endocrine Symptoms Reported: No symptoms reported    Gastrointestinal Gastrointestinal Symptoms Reported: Incontinence Gastrointestinal Management Strategies: Medication therapy, Incontinence garment/pad Gastrointestinal Self-Management Outcome: 3 (uncertain)    Genitourinary Genitourinary Symptoms Reported: Incontinence    Integumentary Additional Integumentary Details: areas on his leg need dermatologist    Musculoskeletal Musculoskelatal Symptoms Reviewed: Difficulty walking, Joint pain, Limited mobility, Unsteady gait, Weakness Musculoskeletal Management Strategies: Adequate rest, Medical device, Medication therapy, Routine screening Musculoskeletal Self-Management Outcome: 3 (uncertain) Musculoskeletal  Comment: parkinson  has falls related to getting up in mid of night to use commoder Recent fall Falls in the past year?: Yes Number of falls in past year: 2 or more Was there an injury with Fall?: No Fall Risk Category Calculator: 2 Patient Fall Risk Level: Moderate Fall Risk Patient at Risk for Falls Due to: History of fall(s), Impaired balance/gait, Impaired mobility, Mental status change Fall risk Follow up: Falls evaluation completed, Falls prevention discussed  Psychosocial       Quality of Family Relationships: helpful, involved, supportive Do you feel physically threatened by others?: No    07/15/2024    PHQ2-9 Depression Screening   Little interest or pleasure in doing things Several days  Feeling down, depressed, or hopeless Several days  PHQ-2 - Total Score 2  Trouble falling or staying asleep, or sleeping too much Several days  Feeling tired or having little energy Several days  Poor appetite or overeating  Several days  Feeling bad about yourself - or that you are a failure or have let yourself or your family down Not at all  Trouble concentrating on things, such as reading the newspaper or watching television Several days  Moving or speaking so slowly that other people could have noticed.  Or the opposite - being so fidgety or restless that you have been moving around a lot more than usual Not at all  Thoughts that you would be better off dead, or hurting yourself in some way Not at all  PHQ2-9 Total Score 6  If you checked off any problems, how difficult have these problems made it for you to do your work, take care of things at home, or get along with other people Somewhat difficult  Depression Interventions/Treatment      There were no vitals filed for this visit.  Medications Reviewed Today     Reviewed by Ramonita Suzen CROME, RN (Registered Nurse) on 07/15/24 at 1342  Med List Status: <None>   Medication Order Taking? Sig Documenting Provider Last Dose Status Informant   acetaminophen  (TYLENOL ) 500 MG tablet [id number]  Take 2 tablets (1,000 mg total) by mouth every 6 (six) hours as needed for moderate pain (pain score 4-6). Nivia Colon, PA-C  Active Spouse/Significant Other, Care Giver  amiodarone  (PACERONE ) 200 MG tablet [id number]  TAKE 1/2 TABLET BY MOUTH DAILY Court Dorn PARAS, MD  Active Spouse/Significant Other, Care Giver  benzonatate  (TESSALON ) 100 MG capsule [id number]  Take 1-2 capsules (100-200 mg total) by mouth 2 (two) times daily as needed for cough.  Patient not taking: Reported on 07/01/2024   Domenica Harlene LABOR, MD  Active   budesonide -formoterol  (SYMBICORT ) 160-4.5 MCG/ACT inhaler [id number]  Inhale 2 puffs into the lungs 2 (two) times daily. Amon Aloysius BRAVO, MD  Active Spouse/Significant Other, Care Giver  carbidopa -levodopa  (SINEMET  CR) 50-200 MG tablet [id number]  Take 1 tablet by mouth at bedtime. Evonnie Asberry RAMAN, DO  Active   carbidopa -levodopa  (SINEMET  IR) 25-100 MG tablet [id number]  TAKE 2 TABLETS BY MOUTH AT 7AM, 2 TABS AT 11AM, 1 TAB AT 3PM, AND 1 TAB AT 7 PM Tat, Rebecca S, DO  Active   cholecalciferol (VITAMIN D3) 25 MCG (1000 UNIT) tablet [id number]  Take 2,000 Units by mouth daily. [provider]  Active Spouse/Significant Other, Care Giver  divalproex  (DEPAKOTE ) 125 MG DR tablet [id number]  TAKE 1 TABLET (125 MG TOTAL) BY MOUTH AT BEDTIME Wertman, Sara E, PA-C  Active Spouse/Significant Other, Care Giver  ezetimibe  (ZETIA ) 10 MG  tablet [id number]  TAKE 1 TABLET BY MOUTH EVERY DAY Court Dorn PARAS, MD  Active   GEMTESA 75 MG TABS [id number]  Take 1 tablet by mouth daily. [provider]  Active   guaifenesin  (ROBITUSSIN) 100 MG/5ML syrup [id number]  Take 10 mLs (200 mg total) by mouth 3 (three) times daily as needed for cough. Paz, Jose E, MD  Active Spouse/Significant Other, Care Giver  hydrochlorothiazide  (HYDRODIURIL ) 25 MG tablet [id number]  TAKE 1 TABLET BY MOUTH EVERY DAY Domenica Harlene LABOR, MD  Active Spouse/Significant Other, Care  Giver  levalbuterol  (XOPENEX  HFA) 45 MCG/ACT inhaler [id number]  INHALE 1 TO 2 PUFFS BY MOUTH EVERY 6 HOURS AS NEEDED FOR WHEEZE Domenica Harlene LABOR, MD  Active Spouse/Significant Other, Care Giver  Melatonin 10 MG TABS [id number]  Take 10 mg by mouth daily as needed. [provider]  Active Spouse/Significant Other, Care Giver  Multiple Vitamins-Minerals (ICAPS AREDS 2 PO) [id number]  Take 1 capsule by mouth in the morning and at bedtime. [provider]  Active Spouse/Significant Other, Care Giver  pantoprazole  (PROTONIX ) 40 MG tablet [id number]  Take 1 tablet (40 mg total) by mouth daily as needed. Daryl Setter, NP  Active Spouse/Significant Other, Care Giver  polyethylene glycol (MIRALAX  / GLYCOLAX ) 17 g packet [id number]  Take 17 g by mouth daily as needed for mild constipation. Bergman, Meghan D, NP  Active   senna (SENOKOT) 8.6 MG TABS tablet [id number]  Take 1 tablet by mouth in the morning and at bedtime. [provider]  Active Spouse/Significant Other, Care Giver  sertraline  (ZOLOFT ) 100 MG tablet [id number]  TAKE 1 TABLET BY MOUTH EVERY DAY Domenica Harlene LABOR, MD  Active   Vitamin D , Ergocalciferol , (DRISDOL ) 1.25 MG (50000 UNIT) CAPS capsule [id number]  Take 1 capsule (50,000 Units total) by mouth every 7 (seven) days. Domenica Harlene LABOR, MD  Active             Recommendation:   PCP Follow-up Referral to: dermatology & audiology  Continue Current Plan of Care Hearing aids needed   Follow Up Plan:   Telephone follow up appointment date/time:  07/30/24 with lynne norris 230 pm   Jibran Crookshanks L. Ramonita, RN, BSN, CCM [place]  [facility], [facility] RN Care Manager Direct Dial: [phone]  Fax: [phone]

## 2024-07-02 ENCOUNTER — Other Ambulatory Visit: Payer: Self-pay | Admitting: Family

## 2024-07-02 ENCOUNTER — Telehealth: Payer: Self-pay | Admitting: Family Medicine

## 2024-07-02 DIAGNOSIS — D179 Benign lipomatous neoplasm, unspecified: Secondary | ICD-10-CM

## 2024-07-02 NOTE — Telephone Encounter (Signed)
 Copied from CRM [id number]. Topic: Clinical - Prescription Issue >> Jul 02, 2024  4:29 PM Drema MATSU wrote: Reason for CRM: [facility].(Pt unsure of name) Patient wants it sent to [facility] in [place]. Please call patient.

## 2024-07-02 NOTE — Telephone Encounter (Signed)
Spoke with patient and notified that referral has been placed.

## 2024-07-05 NOTE — Telephone Encounter (Signed)
 Returned patients call and no answer left vm to return call.

## 2024-07-06 DIAGNOSIS — G912 (Idiopathic) normal pressure hydrocephalus: Secondary | ICD-10-CM | POA: Diagnosis not present

## 2024-07-06 DIAGNOSIS — R3914 Feeling of incomplete bladder emptying: Secondary | ICD-10-CM | POA: Diagnosis not present

## 2024-07-06 DIAGNOSIS — N319 Neuromuscular dysfunction of bladder, unspecified: Secondary | ICD-10-CM | POA: Diagnosis not present

## 2024-07-06 DIAGNOSIS — N401 Enlarged prostate with lower urinary tract symptoms: Secondary | ICD-10-CM | POA: Diagnosis not present

## 2024-07-07 NOTE — Telephone Encounter (Signed)
 Called patient and no answer left vm to return call

## 2024-07-08 ENCOUNTER — Telehealth: Payer: Self-pay | Admitting: Family Medicine

## 2024-07-08 NOTE — Telephone Encounter (Signed)
 Copied from CRM #[id number]. Topic: Clinical - Prescription Issue >> Jul 07, 2024  5:20 PM Alfonso HERO wrote: Reason for CRM: patient calling to if the refill has been sent to the alternate pharmacy

## 2024-07-08 NOTE — Telephone Encounter (Signed)
 LMOM asking what medication he is needing sent to [facility].

## 2024-07-08 NOTE — Telephone Encounter (Unsigned)
 Copied from CRM [id number]. Topic: Clinical - Medication Refill >> Jul 08, 2024  5:53 PM Alfonso ORN wrote: Medication: Viagra   Has the patient contacted their pharmacy? No   This is the patient's preferred pharmacy:    [facility] [address] [place] [address] Phone: [phone] Fax: [phone]   Is this the correct pharmacy for this prescription? Yes If no, delete pharmacy and type the correct one.    Has the prescription been filled recently? No   Is the patient out of the medication? Yes   Has the patient been seen for an appointment in the last year OR does the patient have an upcoming appointment? Yes   Can we respond through MyChart? No

## 2024-07-09 ENCOUNTER — Telehealth: Payer: Self-pay

## 2024-07-09 NOTE — Telephone Encounter (Signed)
 Copied from CRM [id number]. Topic: Clinical - Medication Refill >> Jul 08, 2024  5:53 PM Alfonso ORN wrote: Medication: Viagra   Has the patient contacted their pharmacy? No   This is the patient's preferred pharmacy:    [facility] [address] [place] [address] Phone: [phone] Fax: [phone]   Is this the correct pharmacy for this prescription? Yes If no, delete pharmacy and type the correct one.    Has the prescription been filled recently? No   Is the patient out of the medication? Yes   Has the patient been seen for an appointment in the last year OR does the patient have an upcoming appointment? Yes   Can we respond through MyChart? No

## 2024-07-09 NOTE — Telephone Encounter (Signed)
 See other telephone note.

## 2024-07-09 NOTE — Telephone Encounter (Signed)
See other telephone notes.  

## 2024-07-09 NOTE — Telephone Encounter (Signed)
 Copied from CRM [id number]. Topic: Clinical - Prescription Issue >> Jul 08, 2024  5:36 PM Alfonso ORN wrote: Reason for CRM: pt called back to confirm new pharm information for Viagra prescription. Please call back to confirm address and phone number of pharmacy.

## 2024-07-09 NOTE — Telephone Encounter (Signed)
 Please advise? I don't see that Pt has ever been prescribed sildenafil?

## 2024-07-12 ENCOUNTER — Telehealth: Payer: Self-pay

## 2024-07-12 NOTE — Telephone Encounter (Signed)
 Initial Comment Caller states that he spoke with the doctor, asked for a prescription for Viagra + some other mix, was told [facility], transferred it somewhere else, states that he cannot find it. Translation No Disp. Time Titus Time) Disposition Final User 07/11/2024 4:29:45 PM Attempt made - message left Debby RN, Cheri 07/11/2024 4:44:15 PM FINAL ATTEMPT MADE - no message left Yes Debby RN, Cheri Final Disposition 07/11/2024 4:44:15 PM FINAL ATTEMPT MADE - no message left Yes Debby, RN, [employer]

## 2024-07-12 NOTE — Telephone Encounter (Signed)
 Per patient he saw a commercial about this on TV.  Back on 8/8 virtual with Yacopino she stated this in her note: Cialis can cause vasodilation, leading to hypotension and orthostatic dizziness, especially in those on antihypertensives .  Apparently she has spoke with him about this and he often forgets that he talks about it.

## 2024-07-12 NOTE — Telephone Encounter (Signed)
See other telephone notes.  

## 2024-07-12 NOTE — Telephone Encounter (Signed)
 Patient notified

## 2024-07-15 ENCOUNTER — Telehealth: Payer: Self-pay | Admitting: *Deleted

## 2024-07-15 ENCOUNTER — Other Ambulatory Visit: Payer: Self-pay

## 2024-07-15 NOTE — Telephone Encounter (Signed)
 Copied from CRM #[id number]. Topic: Clinical - Medical Advice >> Jul 14, 2024  3:58 PM Shereese L wrote: Reason for CRM: patient is requesting to continue PT and OT at [facility] and would like a call back to regards to continuing therapy # [phone]

## 2024-07-15 NOTE — Telephone Encounter (Signed)
 Left detailed message on machine ok to do the OT and PT

## 2024-07-15 NOTE — Patient Instructions (Signed)
 Visit Information  Thank you for taking time to visit with me today. Please don't hesitate to contact me if I can be of assistance to you before our next scheduled appointment.  Your next care management appointment is by telephone on 07/30/24 at 230 pm    Please call the care guide team at [phone] if you need to cancel, schedule, or reschedule an appointment.   Please call the Suicide and Crisis Lifeline: 988 call the [place]  National Suicide Prevention Lifeline: 9306345955 or TTY: 321-108-6699 TTY (581) 249-8131) to talk to a trained counselor call 1-800-273-TALK (toll free, 24 hour hotline) go to [facility] [address] [place] [phone]) call 911 if you are experiencing a Mental Health or Behavioral Health Crisis or need someone to talk to.  Jahkari Maclin L. Ramonita, RN, BSN, CCM [place]  [facility], [facility] RN Care Manager Direct Dial: [phone]  Fax: [phone]

## 2024-07-20 ENCOUNTER — Telehealth: Payer: Self-pay | Admitting: Family Medicine

## 2024-07-20 NOTE — Telephone Encounter (Signed)
 Copied from CRM [id number]. Topic: Referral - Request for Referral >> Jul 20, 2024  9:48 AM Walter Brown wrote: Did the patient discuss referral with their provider in the last year? Yes (If No - schedule appointment) (If Yes - send message)  Appointment offered? No  Type of order/referral and detailed reason for visit: Dermatology, rash on head  Preference of office, provider, location: Dermatologist in [place], [place] [address] [place], [place] [address]   [phone]  If referral order, have you been seen by this specialty before? Yes (If Yes, this issue or another issue? When? Where?  Can we respond through MyChart? No

## 2024-07-21 NOTE — Telephone Encounter (Signed)
**Note De-identified  [place] Obfuscation** Please advise 

## 2024-07-22 ENCOUNTER — Ambulatory Visit: Payer: Self-pay

## 2024-07-22 DIAGNOSIS — G20A1 Parkinson's disease without dyskinesia, without mention of fluctuations: Secondary | ICD-10-CM | POA: Diagnosis not present

## 2024-07-22 DIAGNOSIS — H353290 Exudative age-related macular degeneration, unspecified eye, stage unspecified: Secondary | ICD-10-CM | POA: Diagnosis not present

## 2024-07-22 DIAGNOSIS — D630 Anemia in neoplastic disease: Secondary | ICD-10-CM | POA: Diagnosis not present

## 2024-07-22 DIAGNOSIS — F320 Major depressive disorder, single episode, mild: Secondary | ICD-10-CM | POA: Diagnosis not present

## 2024-07-22 DIAGNOSIS — G3184 Mild cognitive impairment, so stated: Secondary | ICD-10-CM | POA: Diagnosis not present

## 2024-07-22 DIAGNOSIS — F02A3 Dementia in other diseases classified elsewhere, mild, with mood disturbance: Secondary | ICD-10-CM | POA: Diagnosis not present

## 2024-07-22 DIAGNOSIS — F411 Generalized anxiety disorder: Secondary | ICD-10-CM | POA: Diagnosis not present

## 2024-07-22 DIAGNOSIS — H9193 Unspecified hearing loss, bilateral: Secondary | ICD-10-CM | POA: Diagnosis not present

## 2024-07-22 DIAGNOSIS — N319 Neuromuscular dysfunction of bladder, unspecified: Secondary | ICD-10-CM | POA: Diagnosis not present

## 2024-07-22 DIAGNOSIS — D6869 Other thrombophilia: Secondary | ICD-10-CM | POA: Diagnosis not present

## 2024-07-22 DIAGNOSIS — Z9181 History of falling: Secondary | ICD-10-CM | POA: Diagnosis not present

## 2024-07-22 DIAGNOSIS — I4892 Unspecified atrial flutter: Secondary | ICD-10-CM | POA: Diagnosis not present

## 2024-07-22 DIAGNOSIS — I10 Essential (primary) hypertension: Secondary | ICD-10-CM | POA: Diagnosis not present

## 2024-07-22 DIAGNOSIS — Z87440 Personal history of urinary (tract) infections: Secondary | ICD-10-CM | POA: Diagnosis not present

## 2024-07-22 DIAGNOSIS — J4489 Other specified chronic obstructive pulmonary disease: Secondary | ICD-10-CM | POA: Diagnosis not present

## 2024-07-22 DIAGNOSIS — G912 (Idiopathic) normal pressure hydrocephalus: Secondary | ICD-10-CM | POA: Diagnosis not present

## 2024-07-22 DIAGNOSIS — K5900 Constipation, unspecified: Secondary | ICD-10-CM | POA: Diagnosis not present

## 2024-07-22 DIAGNOSIS — N3941 Urge incontinence: Secondary | ICD-10-CM | POA: Diagnosis not present

## 2024-07-22 DIAGNOSIS — F02A4 Dementia in other diseases classified elsewhere, mild, with anxiety: Secondary | ICD-10-CM | POA: Diagnosis not present

## 2024-07-22 DIAGNOSIS — N400 Enlarged prostate without lower urinary tract symptoms: Secondary | ICD-10-CM | POA: Diagnosis not present

## 2024-07-22 DIAGNOSIS — I4891 Unspecified atrial fibrillation: Secondary | ICD-10-CM | POA: Diagnosis not present

## 2024-07-22 DIAGNOSIS — M545 Low back pain, unspecified: Secondary | ICD-10-CM | POA: Diagnosis not present

## 2024-07-22 DIAGNOSIS — D179 Benign lipomatous neoplasm, unspecified: Secondary | ICD-10-CM | POA: Diagnosis not present

## 2024-07-22 DIAGNOSIS — I2510 Atherosclerotic heart disease of native coronary artery without angina pectoris: Secondary | ICD-10-CM | POA: Diagnosis not present

## 2024-07-22 DIAGNOSIS — G4733 Obstructive sleep apnea (adult) (pediatric): Secondary | ICD-10-CM | POA: Diagnosis not present

## 2024-07-22 DIAGNOSIS — Z7901 Long term (current) use of anticoagulants: Secondary | ICD-10-CM | POA: Diagnosis not present

## 2024-07-22 DIAGNOSIS — R001 Bradycardia, unspecified: Secondary | ICD-10-CM | POA: Diagnosis not present

## 2024-07-22 DIAGNOSIS — I480 Paroxysmal atrial fibrillation: Secondary | ICD-10-CM | POA: Diagnosis not present

## 2024-07-22 DIAGNOSIS — R600 Localized edema: Secondary | ICD-10-CM | POA: Diagnosis not present

## 2024-07-22 DIAGNOSIS — E785 Hyperlipidemia, unspecified: Secondary | ICD-10-CM | POA: Diagnosis not present

## 2024-07-22 NOTE — Telephone Encounter (Unsigned)
 Copied from CRM [id number]. Topic: General - Other >> Jul 22, 2024  1:41 PM Thersia BROCKS wrote: Reason for CRM: Marinda pt from [facility]  have him down for PT Discharge, Patient stated that he was suppose to continue , but havent received a verbal order, wanted to know if continue or if its okay to discharge [phone]

## 2024-07-22 NOTE — Telephone Encounter (Signed)
 Appt scheduled

## 2024-07-22 NOTE — Telephone Encounter (Signed)
 FYI Only or Action Required?: FYI only for provider.  Patient was last seen in primary care on 05/24/2024 by Domenica Harlene LABOR, MD.  Called Nurse Triage reporting Rash.  Symptoms began several months ago.  Interventions attempted: OTC medications: topical ointments.  Symptoms are: unchanged.  Triage Disposition: See PCP Within 2 Weeks  Patient/caregiver understands and will follow disposition?: Yes         Copied from CRM #[id number]. Topic: Appointments - Appointment Scheduling >> Jul 22, 2024 10:01 AM Alfonso HERO wrote: Patient has scaling area in his nose and ear and its red. Want to avoid infection Reason for Disposition  Dry skin is chronic problem (recurrent or ongoing AND present > 4 weeks)  Answer Assessment - Initial Assessment Questions 1. MAIN SYMPTOM: What is your main concern or symptom? (e.g., dry, flaky, bumpy or rough, cracked, itchy)     Scaly, redness 2. LOCATION: Where is the cracked or dry skin located?       Nose and ear 3. ONSET: When did the cracked or dry skin begin?     A while - a few months 4. CAUSE: What do you think is causing the cracked or dry skin?     unknown 5. PAIN: Is there any pain? If Yes, ask: How bad is the pain?  (e.g., Scale 1-10; mild, moderate, or severe)     denies 6. INFECTION: Does the skin look red or infected?     Looks red. 7. OTHER SYMPTOMS: Do you have any other symptoms? (e.g.,  fever, itching, rash or redness)     denies 8. PREGNANCY: Is there any chance you are pregnant? When was your last menstrual period?     N/a  Protocols used: Cracked or Dry Skin-A-AH

## 2024-07-22 NOTE — Telephone Encounter (Signed)
 Spoke w/ Marinda- verbal orders given again to continue PT/OT.

## 2024-07-26 ENCOUNTER — Encounter: Payer: Self-pay | Admitting: Family Medicine

## 2024-07-26 ENCOUNTER — Ambulatory Visit: Admitting: Family Medicine

## 2024-07-26 VITALS — BP 134/72 | HR 58 | Ht 65.0 in | Wt 153.0 lb

## 2024-07-26 DIAGNOSIS — R234 Changes in skin texture: Secondary | ICD-10-CM | POA: Diagnosis not present

## 2024-07-26 NOTE — Patient Instructions (Signed)
 Keep skin clean, pat dry. Apply Eucerin lotion to areas of dry skin/irritation.  For any itchy areas, okay to use a small amount of over-the-counter hydrocortisone 1-2 times per day for a week at a time, then take a break. We don't like using a lot of steroid creams on the face.  Call the dermatologist about previously placed referral so they can get you scheduled for skin check given your history of skin cancer.

## 2024-07-26 NOTE — Progress Notes (Signed)
   Acute Office Visit  Subjective:     Patient ID: Walter Brown, male    DOB: [birth year], 83 y.o.   MRN: [id number]  Chief Complaint  Patient presents with   Rash    Patient is in today for dry skin to face.   Discussed the use of AI scribe software for clinical note transcription with the patient, who gave verbal consent to proceed.  History of Present Illness Walter Brown is an 83 year old male who presents with skin irritation on the face.  He has been experiencing skin irritation for about a week and a half, primarily located on the sides of his nose and extending into his beard. The patient reports that the area is not itchy or painful. He has been using Vaseline to manage the dryness, which has helped with the flakiness. Prior to this, he tried an over-the-counter ringworm cream, but currently, Vaseline is the only treatment he is using.  No recent use of new soaps, detergents, or shaving creams that could cause an allergic reaction. He does not use a CPAP mask or any other facial coverings that could contribute to the irritation. No recent increase in touching or rubbing his nose due to allergies.  His past medical history includes basal cell carcinoma, with previous lesions on his head and nose, treated approximately 5-7 years ago.        All review of systems negative except what is listed in the HPI      Objective:    BP 134/72   Pulse (!) 58   Ht 5' 5 (1.651 m)   Wt 153 lb (69.4 kg)   SpO2 97%   BMI 25.46 kg/m    Physical Exam Vitals reviewed.  Constitutional:      Appearance: Normal appearance.  HENT:     Head:      Comments: Faint erythema and dry skin to areas marked in red on the diagram; no papules/pustules, hives, or increased warmth to the areas.   Skin:    General: Skin is warm and dry.  Neurological:     Mental Status: He is alert. Mental status is at baseline.  Psychiatric:        Mood and Affect: Mood normal.        Behavior:  Behavior normal.        Thought Content: Thought content normal.        Judgment: Judgment normal.          No results found for any visits on 07/26/24.      Assessment & Plan:   Problem List Items Addressed This Visit   None Visit Diagnoses       Skin texture changes    -  Primary      Keep skin clean, pat dry. Apply Eucerin lotion to areas of dry skin/irritation.  For any itchy areas, okay to use a small amount of over-the-counter hydrocortisone 1-2 times per day for a week at a time, then take a break. We don't like using a lot of steroid creams on the face.  Call the dermatologist about previously placed referral so they can get you scheduled for skin check given your history of skin cancer.   No orders of the defined types were placed in this encounter.   Return if symptoms worsen or fail to improve.  Waddell KATHEE Mon, NP

## 2024-07-30 ENCOUNTER — Telehealth: Payer: Self-pay

## 2024-07-30 DIAGNOSIS — J45909 Unspecified asthma, uncomplicated: Secondary | ICD-10-CM | POA: Diagnosis not present

## 2024-07-30 DIAGNOSIS — D179 Benign lipomatous neoplasm, unspecified: Secondary | ICD-10-CM | POA: Diagnosis not present

## 2024-07-30 DIAGNOSIS — I480 Paroxysmal atrial fibrillation: Secondary | ICD-10-CM | POA: Diagnosis not present

## 2024-07-30 DIAGNOSIS — G912 (Idiopathic) normal pressure hydrocephalus: Secondary | ICD-10-CM | POA: Diagnosis not present

## 2024-07-30 DIAGNOSIS — G3184 Mild cognitive impairment, so stated: Secondary | ICD-10-CM | POA: Diagnosis not present

## 2024-07-30 DIAGNOSIS — I10 Essential (primary) hypertension: Secondary | ICD-10-CM | POA: Diagnosis not present

## 2024-07-30 DIAGNOSIS — G20A1 Parkinson's disease without dyskinesia, without mention of fluctuations: Secondary | ICD-10-CM | POA: Diagnosis not present

## 2024-07-30 DIAGNOSIS — D630 Anemia in neoplastic disease: Secondary | ICD-10-CM | POA: Diagnosis not present

## 2024-07-30 DIAGNOSIS — I2510 Atherosclerotic heart disease of native coronary artery without angina pectoris: Secondary | ICD-10-CM | POA: Diagnosis not present

## 2024-07-30 NOTE — Patient Instructions (Signed)
 Visit Information  Thank you for taking time to visit with me today. Please don't hesitate to contact me if I can be of assistance to you before our next scheduled appointment.  Your next care management appointment is by telephone on 08-30-2024 at 3:00 PM   Telephone follow-up in 1 month  Please call the care guide team at [phone] if you need to cancel, schedule, or reschedule an appointment.   Please call the Suicide and Crisis Lifeline: 988 call the [place]  National Suicide Prevention Lifeline: (847) 104-1949 or TTY: 402-210-0803 TTY 479-763-7405) to talk to a trained counselor call 1-800-273-TALK (toll free, 24 hour hotline) go to [facility] [address], [place] [phone]) call 911 if you are experiencing a Mental Health or Behavioral Health Crisis or need someone to talk to.  Hendricks Her RN, BSN  [place] [facility]Population Health RN Case Information systems manager [phone]

## 2024-08-01 DIAGNOSIS — R350 Frequency of micturition: Secondary | ICD-10-CM | POA: Diagnosis not present

## 2024-08-02 DIAGNOSIS — R350 Frequency of micturition: Secondary | ICD-10-CM | POA: Diagnosis not present

## 2024-08-02 DIAGNOSIS — N401 Enlarged prostate with lower urinary tract symptoms: Secondary | ICD-10-CM | POA: Diagnosis not present

## 2024-08-02 DIAGNOSIS — E860 Dehydration: Secondary | ICD-10-CM | POA: Diagnosis not present

## 2024-08-02 DIAGNOSIS — R351 Nocturia: Secondary | ICD-10-CM | POA: Diagnosis not present

## 2024-08-03 DIAGNOSIS — I10 Essential (primary) hypertension: Secondary | ICD-10-CM | POA: Diagnosis not present

## 2024-08-03 DIAGNOSIS — R399 Unspecified symptoms and signs involving the genitourinary system: Secondary | ICD-10-CM | POA: Diagnosis not present

## 2024-08-03 DIAGNOSIS — I480 Paroxysmal atrial fibrillation: Secondary | ICD-10-CM | POA: Diagnosis not present

## 2024-08-03 DIAGNOSIS — N3281 Overactive bladder: Secondary | ICD-10-CM | POA: Diagnosis not present

## 2024-08-03 DIAGNOSIS — I2510 Atherosclerotic heart disease of native coronary artery without angina pectoris: Secondary | ICD-10-CM | POA: Diagnosis not present

## 2024-08-03 DIAGNOSIS — G912 (Idiopathic) normal pressure hydrocephalus: Secondary | ICD-10-CM | POA: Diagnosis not present

## 2024-08-03 DIAGNOSIS — R351 Nocturia: Secondary | ICD-10-CM | POA: Diagnosis not present

## 2024-08-03 DIAGNOSIS — R350 Frequency of micturition: Secondary | ICD-10-CM | POA: Diagnosis not present

## 2024-08-03 DIAGNOSIS — D179 Benign lipomatous neoplasm, unspecified: Secondary | ICD-10-CM | POA: Diagnosis not present

## 2024-08-03 DIAGNOSIS — J45909 Unspecified asthma, uncomplicated: Secondary | ICD-10-CM | POA: Diagnosis not present

## 2024-08-03 DIAGNOSIS — D630 Anemia in neoplastic disease: Secondary | ICD-10-CM | POA: Diagnosis not present

## 2024-08-03 DIAGNOSIS — G20A1 Parkinson's disease without dyskinesia, without mention of fluctuations: Secondary | ICD-10-CM | POA: Diagnosis not present

## 2024-08-03 DIAGNOSIS — G3184 Mild cognitive impairment, so stated: Secondary | ICD-10-CM | POA: Diagnosis not present

## 2024-08-05 ENCOUNTER — Telehealth: Payer: Self-pay

## 2024-08-05 DIAGNOSIS — G3184 Mild cognitive impairment, so stated: Secondary | ICD-10-CM | POA: Diagnosis not present

## 2024-08-05 DIAGNOSIS — J45909 Unspecified asthma, uncomplicated: Secondary | ICD-10-CM | POA: Diagnosis not present

## 2024-08-05 DIAGNOSIS — I10 Essential (primary) hypertension: Secondary | ICD-10-CM | POA: Diagnosis not present

## 2024-08-05 DIAGNOSIS — G912 (Idiopathic) normal pressure hydrocephalus: Secondary | ICD-10-CM | POA: Diagnosis not present

## 2024-08-05 DIAGNOSIS — I480 Paroxysmal atrial fibrillation: Secondary | ICD-10-CM | POA: Diagnosis not present

## 2024-08-05 DIAGNOSIS — D179 Benign lipomatous neoplasm, unspecified: Secondary | ICD-10-CM | POA: Diagnosis not present

## 2024-08-05 DIAGNOSIS — G20A1 Parkinson's disease without dyskinesia, without mention of fluctuations: Secondary | ICD-10-CM | POA: Diagnosis not present

## 2024-08-05 DIAGNOSIS — I2510 Atherosclerotic heart disease of native coronary artery without angina pectoris: Secondary | ICD-10-CM | POA: Diagnosis not present

## 2024-08-05 DIAGNOSIS — D630 Anemia in neoplastic disease: Secondary | ICD-10-CM | POA: Diagnosis not present

## 2024-08-05 NOTE — Telephone Encounter (Signed)
 Plan of care signed and faxed back to [facility] at [phone]. Form sent for scanning.

## 2024-08-10 ENCOUNTER — Telehealth: Payer: Self-pay | Admitting: Family Medicine

## 2024-08-10 DIAGNOSIS — I2510 Atherosclerotic heart disease of native coronary artery without angina pectoris: Secondary | ICD-10-CM | POA: Diagnosis not present

## 2024-08-10 DIAGNOSIS — I480 Paroxysmal atrial fibrillation: Secondary | ICD-10-CM | POA: Diagnosis not present

## 2024-08-10 DIAGNOSIS — G912 (Idiopathic) normal pressure hydrocephalus: Secondary | ICD-10-CM | POA: Diagnosis not present

## 2024-08-10 DIAGNOSIS — D179 Benign lipomatous neoplasm, unspecified: Secondary | ICD-10-CM | POA: Diagnosis not present

## 2024-08-10 DIAGNOSIS — G20A1 Parkinson's disease without dyskinesia, without mention of fluctuations: Secondary | ICD-10-CM | POA: Diagnosis not present

## 2024-08-10 DIAGNOSIS — G3184 Mild cognitive impairment, so stated: Secondary | ICD-10-CM | POA: Diagnosis not present

## 2024-08-10 DIAGNOSIS — I10 Essential (primary) hypertension: Secondary | ICD-10-CM | POA: Diagnosis not present

## 2024-08-10 DIAGNOSIS — D630 Anemia in neoplastic disease: Secondary | ICD-10-CM | POA: Diagnosis not present

## 2024-08-10 NOTE — Telephone Encounter (Signed)
 Returned pts wife call and she was advised that referral was placed on 07/02/24 and approved. Tried calling the number back and no answer.

## 2024-08-10 NOTE — Telephone Encounter (Signed)
 Copied from CRM [id number]. Topic: Referral - Status >> Aug 10, 2024  1:14 PM Thersia BROCKS wrote: Reason for CRM: dermatology specialists is calling in regarding patient referral, would like a callback regarding this at  [phone]

## 2024-08-10 NOTE — Telephone Encounter (Unsigned)
 Copied from CRM #[id number]. Topic: Referral - Question >> Aug 10, 2024  4:10 PM Aisha D wrote: Reason for CRM: Pt's wife Walter Brown is calling to get an update on the dermatology referral. Walter Brown stated that she contacted the office and they stated that they have not received anything. Walter Brown would like a callback with an update.

## 2024-08-13 DIAGNOSIS — G3184 Mild cognitive impairment, so stated: Secondary | ICD-10-CM | POA: Diagnosis not present

## 2024-08-13 DIAGNOSIS — D179 Benign lipomatous neoplasm, unspecified: Secondary | ICD-10-CM | POA: Diagnosis not present

## 2024-08-13 DIAGNOSIS — J45909 Unspecified asthma, uncomplicated: Secondary | ICD-10-CM | POA: Diagnosis not present

## 2024-08-13 DIAGNOSIS — I480 Paroxysmal atrial fibrillation: Secondary | ICD-10-CM | POA: Diagnosis not present

## 2024-08-13 DIAGNOSIS — G20A1 Parkinson's disease without dyskinesia, without mention of fluctuations: Secondary | ICD-10-CM | POA: Diagnosis not present

## 2024-08-13 NOTE — Telephone Encounter (Signed)
 Spoke w/ dermatology office and the call was transferred to the referral team.

## 2024-08-16 ENCOUNTER — Other Ambulatory Visit: Payer: Self-pay | Admitting: Family Medicine

## 2024-08-17 DIAGNOSIS — G912 (Idiopathic) normal pressure hydrocephalus: Secondary | ICD-10-CM | POA: Diagnosis not present

## 2024-08-20 ENCOUNTER — Telehealth: Payer: Self-pay

## 2024-08-20 NOTE — Telephone Encounter (Signed)
 Called Walter Brown w/ [facility] and no answer. Left message to return call.

## 2024-08-20 NOTE — Telephone Encounter (Signed)
 Copied from CRM #[id number]. Topic: Clinical - Home Health Verbal Orders >> Aug 20, 2024  1:00 PM Pinkey ORN wrote: Caller/Agency: [facility]  Callback Number: [phone] Service Requested: Physical Therapy Frequency: 2 x week for 3 more weeks  Any new concerns about the patient? No

## 2024-08-23 DIAGNOSIS — L219 Seborrheic dermatitis, unspecified: Secondary | ICD-10-CM | POA: Diagnosis not present

## 2024-08-23 DIAGNOSIS — L570 Actinic keratosis: Secondary | ICD-10-CM | POA: Diagnosis not present

## 2024-08-23 NOTE — Telephone Encounter (Signed)
VO given on secure voicemail

## 2024-08-25 DIAGNOSIS — G912 (Idiopathic) normal pressure hydrocephalus: Secondary | ICD-10-CM | POA: Diagnosis not present

## 2024-08-25 DIAGNOSIS — I2510 Atherosclerotic heart disease of native coronary artery without angina pectoris: Secondary | ICD-10-CM | POA: Diagnosis not present

## 2024-08-25 DIAGNOSIS — G20A1 Parkinson's disease without dyskinesia, without mention of fluctuations: Secondary | ICD-10-CM | POA: Diagnosis not present

## 2024-08-25 DIAGNOSIS — D630 Anemia in neoplastic disease: Secondary | ICD-10-CM | POA: Diagnosis not present

## 2024-08-25 DIAGNOSIS — I480 Paroxysmal atrial fibrillation: Secondary | ICD-10-CM | POA: Diagnosis not present

## 2024-08-25 DIAGNOSIS — D179 Benign lipomatous neoplasm, unspecified: Secondary | ICD-10-CM | POA: Diagnosis not present

## 2024-08-25 DIAGNOSIS — I10 Essential (primary) hypertension: Secondary | ICD-10-CM | POA: Diagnosis not present

## 2024-08-25 DIAGNOSIS — G3184 Mild cognitive impairment, so stated: Secondary | ICD-10-CM | POA: Diagnosis not present

## 2024-08-25 DIAGNOSIS — J45909 Unspecified asthma, uncomplicated: Secondary | ICD-10-CM | POA: Diagnosis not present

## 2024-08-27 ENCOUNTER — Ambulatory Visit: Payer: Self-pay

## 2024-08-27 DIAGNOSIS — D630 Anemia in neoplastic disease: Secondary | ICD-10-CM | POA: Diagnosis not present

## 2024-08-27 DIAGNOSIS — I2510 Atherosclerotic heart disease of native coronary artery without angina pectoris: Secondary | ICD-10-CM | POA: Diagnosis not present

## 2024-08-27 DIAGNOSIS — I480 Paroxysmal atrial fibrillation: Secondary | ICD-10-CM | POA: Diagnosis not present

## 2024-08-27 DIAGNOSIS — I10 Essential (primary) hypertension: Secondary | ICD-10-CM | POA: Diagnosis not present

## 2024-08-27 DIAGNOSIS — G912 (Idiopathic) normal pressure hydrocephalus: Secondary | ICD-10-CM | POA: Diagnosis not present

## 2024-08-27 DIAGNOSIS — G20A1 Parkinson's disease without dyskinesia, without mention of fluctuations: Secondary | ICD-10-CM | POA: Diagnosis not present

## 2024-08-27 DIAGNOSIS — G3184 Mild cognitive impairment, so stated: Secondary | ICD-10-CM | POA: Diagnosis not present

## 2024-08-27 DIAGNOSIS — D179 Benign lipomatous neoplasm, unspecified: Secondary | ICD-10-CM | POA: Diagnosis not present

## 2024-08-27 DIAGNOSIS — J45909 Unspecified asthma, uncomplicated: Secondary | ICD-10-CM | POA: Diagnosis not present

## 2024-08-27 NOTE — Telephone Encounter (Signed)
 FYI Only or Action Required?: FYI only for provider: Home care advised.  Patient was last seen in primary care on 07/26/2024 by Almarie Waddell NOVAK, NP.  Called Nurse Triage reporting Fall.  Symptoms began today.  Interventions attempted: Rest, hydration, or home remedies.  Symptoms are: stable.  Triage Disposition: Home Care  Patient/caregiver understands and will follow disposition?: Yes  Copied from CRM #[id number]. Topic: Clinical - Red Word Triage >> Aug 27, 2024  1:52 PM Robinson H wrote: [facility] that prompted transfer to Nurse Triage: Patient had a fall this morning going from scooter to chair denies any injuries, Burnard calling to notify Reason for Disposition  [1] Recent fall AND [2] no injury  Answer Assessment - Initial Assessment Questions Speaking with Burnard, physical therapist there with the pt now. Calling to report pt fell while transferring between his scooter and an office chair. Chair moved during transfer which caused pt to fall. Did not hit head, fell forward and broke fall with arms and his stomach. Denies any cuts, bruising, bleeding, pain or obvious injury. Assisted up to his chair with the help of his wife. Transferred again later with the help of PT and moving fine, no issues with mobility. Advised home care for now. Advised UC or ED if symptoms develop.  1. MECHANISM: How did the fall happen?     Fell while transferring from scooter to chair, lost balance during the transfer as the chair moved.  2. DOMESTIC VIOLENCE AND ELDER ABUSE SCREENING: Did you fall because someone pushed you or tried to hurt you? If Yes, ask: Are you safe now?     Denies  3. ONSET: When did the fall happen? (e.g., minutes, hours, or days ago)     This morning  4. LOCATION: What part of the body hit the ground? (e.g., back, buttocks, head, hips, knees, hands, head, stomach)     Clemens forward and broke his fall with his arms and stomach. Wife was there to assist him up.  5.  INJURY: Did you hurt (injure) yourself when you fell? If Yes, ask: What did you injure? Tell me more about this? (e.g., body area; type of injury; pain severity)     Denies pain. No cuts bruised bleeding or anything that appears injured  6. PAIN: Is there any pain? If Yes, ask: How bad is the pain? (e.g., Scale 0-10; or none, mild,      Denies  7. SIZE: For cuts, bruises, or swelling, ask: How large is it? (e.g., inches or centimeters)      Denies  9. OTHER SYMPTOMS: Do you have any other symptoms? (e.g., dizziness, fever, weakness; new-onset or worsening).      Denies  10. CAUSE: What do you think caused the fall (or falling)? (e.g., dizzy spell, tripped)       Lost footing while transferring  Protocols used: Falls and [facility]

## 2024-08-27 NOTE — Telephone Encounter (Signed)
 FYI:   Pt does have an upcoming appointment on 09/23/24

## 2024-08-30 ENCOUNTER — Other Ambulatory Visit: Payer: Self-pay

## 2024-08-30 ENCOUNTER — Telehealth: Payer: Self-pay | Admitting: Neurology

## 2024-08-30 DIAGNOSIS — D630 Anemia in neoplastic disease: Secondary | ICD-10-CM | POA: Diagnosis not present

## 2024-08-30 DIAGNOSIS — G3184 Mild cognitive impairment, so stated: Secondary | ICD-10-CM | POA: Diagnosis not present

## 2024-08-30 DIAGNOSIS — J45909 Unspecified asthma, uncomplicated: Secondary | ICD-10-CM | POA: Diagnosis not present

## 2024-08-30 DIAGNOSIS — I10 Essential (primary) hypertension: Secondary | ICD-10-CM | POA: Diagnosis not present

## 2024-08-30 DIAGNOSIS — D179 Benign lipomatous neoplasm, unspecified: Secondary | ICD-10-CM | POA: Diagnosis not present

## 2024-08-30 DIAGNOSIS — I2510 Atherosclerotic heart disease of native coronary artery without angina pectoris: Secondary | ICD-10-CM | POA: Diagnosis not present

## 2024-08-30 DIAGNOSIS — G912 (Idiopathic) normal pressure hydrocephalus: Secondary | ICD-10-CM | POA: Diagnosis not present

## 2024-08-30 DIAGNOSIS — I480 Paroxysmal atrial fibrillation: Secondary | ICD-10-CM | POA: Diagnosis not present

## 2024-08-30 NOTE — Patient Instructions (Signed)
 Visit Information  Thank you for taking time to visit with me today. Please don't hesitate to contact me if I can be of assistance to you before our next scheduled appointment.  Your next care management appointment is by telephone on 09-28-2024 at 11:00 AM   Telephone follow-up in 1 month  Please call the care guide team at [phone] if you need to cancel, schedule, or reschedule an appointment.   Please call the Suicide and Crisis Lifeline: 988 call the [place]  National Suicide Prevention Lifeline: (940)611-2474 or TTY: 252-545-6677 TTY 626 219 5703) to talk to a trained counselor call 1-800-273-TALK (toll free, 24 hour hotline) go to [facility] [address] [place] [phone]) call 911 if you are experiencing a Mental Health or Behavioral Health Crisis or need someone to talk to.  Hendricks Her RN, BSN  [place] [facility] VBCI-Population Health RN Case Information Systems Manager [phone]

## 2024-08-30 NOTE — Telephone Encounter (Signed)
 Called and spoke to wife about calling Dr. Carleton about referral for the referral for pych considering Dr. Carleton is prescribing Sertraline  for this patient

## 2024-08-30 NOTE — Telephone Encounter (Signed)
 Caller states he wants to sch an appt for counseling.

## 2024-09-01 DIAGNOSIS — I480 Paroxysmal atrial fibrillation: Secondary | ICD-10-CM | POA: Diagnosis not present

## 2024-09-01 DIAGNOSIS — J45909 Unspecified asthma, uncomplicated: Secondary | ICD-10-CM | POA: Diagnosis not present

## 2024-09-01 DIAGNOSIS — G20A1 Parkinson's disease without dyskinesia, without mention of fluctuations: Secondary | ICD-10-CM | POA: Diagnosis not present

## 2024-09-01 DIAGNOSIS — D630 Anemia in neoplastic disease: Secondary | ICD-10-CM | POA: Diagnosis not present

## 2024-09-01 DIAGNOSIS — I10 Essential (primary) hypertension: Secondary | ICD-10-CM | POA: Diagnosis not present

## 2024-09-01 DIAGNOSIS — D179 Benign lipomatous neoplasm, unspecified: Secondary | ICD-10-CM | POA: Diagnosis not present

## 2024-09-01 DIAGNOSIS — G912 (Idiopathic) normal pressure hydrocephalus: Secondary | ICD-10-CM | POA: Diagnosis not present

## 2024-09-01 DIAGNOSIS — G3184 Mild cognitive impairment, so stated: Secondary | ICD-10-CM | POA: Diagnosis not present

## 2024-09-01 DIAGNOSIS — I2510 Atherosclerotic heart disease of native coronary artery without angina pectoris: Secondary | ICD-10-CM | POA: Diagnosis not present

## 2024-09-03 ENCOUNTER — Other Ambulatory Visit: Payer: Self-pay | Admitting: Internal Medicine

## 2024-09-06 ENCOUNTER — Other Ambulatory Visit: Payer: Self-pay | Admitting: Family Medicine

## 2024-09-06 ENCOUNTER — Telehealth: Payer: Self-pay | Admitting: Neurology

## 2024-09-06 NOTE — Telephone Encounter (Signed)
 [facility]- 09/03/24 Pt. Wants help with Depression symptoms

## 2024-09-07 NOTE — Telephone Encounter (Signed)
 Called and told patients wife he needs to contact PCP who prescribes the sertraline  for a referral for counseling

## 2024-09-09 DIAGNOSIS — G20A1 Parkinson's disease without dyskinesia, without mention of fluctuations: Secondary | ICD-10-CM | POA: Diagnosis not present

## 2024-09-09 DIAGNOSIS — H353221 Exudative age-related macular degeneration, left eye, with active choroidal neovascularization: Secondary | ICD-10-CM | POA: Diagnosis not present

## 2024-09-09 DIAGNOSIS — D179 Benign lipomatous neoplasm, unspecified: Secondary | ICD-10-CM | POA: Diagnosis not present

## 2024-09-09 DIAGNOSIS — I10 Essential (primary) hypertension: Secondary | ICD-10-CM | POA: Diagnosis not present

## 2024-09-09 DIAGNOSIS — G912 (Idiopathic) normal pressure hydrocephalus: Secondary | ICD-10-CM | POA: Diagnosis not present

## 2024-09-09 DIAGNOSIS — H35372 Puckering of macula, left eye: Secondary | ICD-10-CM | POA: Diagnosis not present

## 2024-09-09 DIAGNOSIS — J45909 Unspecified asthma, uncomplicated: Secondary | ICD-10-CM | POA: Diagnosis not present

## 2024-09-09 DIAGNOSIS — D630 Anemia in neoplastic disease: Secondary | ICD-10-CM | POA: Diagnosis not present

## 2024-09-09 DIAGNOSIS — H353112 Nonexudative age-related macular degeneration, right eye, intermediate dry stage: Secondary | ICD-10-CM | POA: Diagnosis not present

## 2024-09-09 DIAGNOSIS — G3184 Mild cognitive impairment, so stated: Secondary | ICD-10-CM | POA: Diagnosis not present

## 2024-09-09 DIAGNOSIS — H43813 Vitreous degeneration, bilateral: Secondary | ICD-10-CM | POA: Diagnosis not present

## 2024-09-09 DIAGNOSIS — I2510 Atherosclerotic heart disease of native coronary artery without angina pectoris: Secondary | ICD-10-CM | POA: Diagnosis not present

## 2024-09-09 DIAGNOSIS — I480 Paroxysmal atrial fibrillation: Secondary | ICD-10-CM | POA: Diagnosis not present

## 2024-09-16 DIAGNOSIS — I480 Paroxysmal atrial fibrillation: Secondary | ICD-10-CM | POA: Diagnosis not present

## 2024-09-16 DIAGNOSIS — I2510 Atherosclerotic heart disease of native coronary artery without angina pectoris: Secondary | ICD-10-CM | POA: Diagnosis not present

## 2024-09-16 DIAGNOSIS — I10 Essential (primary) hypertension: Secondary | ICD-10-CM | POA: Diagnosis not present

## 2024-09-16 DIAGNOSIS — J45909 Unspecified asthma, uncomplicated: Secondary | ICD-10-CM | POA: Diagnosis not present

## 2024-09-16 DIAGNOSIS — D179 Benign lipomatous neoplasm, unspecified: Secondary | ICD-10-CM | POA: Diagnosis not present

## 2024-09-16 DIAGNOSIS — D630 Anemia in neoplastic disease: Secondary | ICD-10-CM | POA: Diagnosis not present

## 2024-09-16 DIAGNOSIS — G3184 Mild cognitive impairment, so stated: Secondary | ICD-10-CM | POA: Diagnosis not present

## 2024-09-16 DIAGNOSIS — G20A1 Parkinson's disease without dyskinesia, without mention of fluctuations: Secondary | ICD-10-CM | POA: Diagnosis not present

## 2024-09-16 DIAGNOSIS — G912 (Idiopathic) normal pressure hydrocephalus: Secondary | ICD-10-CM | POA: Diagnosis not present

## 2024-09-19 NOTE — Assessment & Plan Note (Signed)
 [id number] acceptable, minimize simple carbs. Increase exercise as tolerated.

## 2024-09-19 NOTE — Progress Notes (Unsigned)
 Subjective:    Patient ID: Walter Brown, male    DOB: [birth year], 83 y.o.   MRN: [id number]  No chief complaint on file.   HPI Discussed the use of AI scribe software for clinical note transcription with the patient, who gave verbal consent to proceed.  History of Present Illness     Past Medical History:  Diagnosis Date   A-fib (HCC)    Allergy 10/07/1944   Anemia 02/07/2018   mild   Arthritis 04/06/2017   Asthma    childhood   ASVD (arteriosclerotic vascular disease) 09/07/2018   Back pain with radiation 04/26/2013   Low back with LLE radiculopathy   Basal cell carcinoma    skin- on nose- basal cell (20 yrs ago) forehead 1 year ago     Sees Dr Darice Reusing of dermatology   BPH (benign prostatic hyperplasia)    Bradycardia 06/28/2018   Cataract 10/07/2014   Cerumen impaction 08/10/2012   Chicken pox as child   Coronary artery calcification 11/23/2019   Dysphagia 09/11/2021   Elevated LFTs 11/2017   suspected drug/antibiotic induced   Essential hypertension    ETD (Eustachian tube dysfunction), right 01/17/2022   Generalized anxiety disorder    GERD (gastroesophageal reflux disease)    German measles as a child   Hemoptysis 01/17/2022   Hyperglycemia 04/25/2020   Hyperlipidemia    Insomnia 02/06/2021   Internal hemorrhoids    Irregular cardiac rhythm 03/10/2018   Lipoma 08/04/2018   Macular degeneration    Major depressive disorder 08/01/2012   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   Nocturia 12/04/2022   Obstructive sleep apnea 11/09/2018   Patient reports mild symptoms; was not prescribed a CPAP machine   Otitis externa 08/10/2012   PAF (paroxysmal atrial fibrillation)    Palpitations 09/07/2018   Parkinson's disease (HCC) 05/19/2019   Recurrent falls 12/04/2022   Stroke (HCC) 02/27/2018   Thrombocytopenia    TIA (transient ischemic attack) 03/10/2018   Unsteady gait  09/11/2021    Past Surgical History:  Procedure Laterality Date   BELPHAROPTOSIS REPAIR     very young, b/l   EXCISIONAL HEMORRHOIDECTOMY     EYE SURGERY  2017   eyelids   HYDROCELE EXCISION / REPAIR  2012   b/l   SHUNT REVISION VENTRICULAR-PERITONEAL Right 03/31/2024   Procedure: REVISION, SHUNT, VENTRICULOPERITONEAL;  Surgeon: Mavis Purchase, MD;  Location: [facility] OR;  Service: Neurosurgery;  Laterality: Right;  SHUNT REVISION   SKIN CANCER EXCISION     nose and forehead, basal cell CA   UPPER GASTROINTESTINAL ENDOSCOPY     VENTRICULOPERITONEAL SHUNT Right 03/29/2024   Procedure: SHUNT INSERTION VENTRICULAR-PERITONEAL;  Surgeon: Mavis Purchase, MD;  Location: [facility] OR;  Service: Neurosurgery;  Laterality: Right;  RT VP SHUNT PLACEMENT    Family History  Problem Relation Age of Onset   Hypertension Mother    Other Mother        MRSA   Heart disease Mother        stents   ADD / ADHD Mother    Arthritis Mother    Asthma Mother    Depression Mother    Vision loss Mother    Heart disease Father    Hypertension Father    Prostate cancer Father 59   Parkinson's disease Father    Cancer Father 98   Hearing loss Father    Stroke Father    Vision loss Father    Varicose Veins Father  Stroke Maternal Grandmother    Prostate cancer Maternal Grandfather    Asthma Maternal Grandfather    Cancer Maternal Grandfather    Cancer Paternal Grandfather        EYE   Hearing loss Paternal Grandfather    ADD / ADHD Son        ADHD   Other Son 24       part of 1 lung removed- due to infection   Alcohol abuse Son    Depression Son    Alcohol abuse Son    Alcohol abuse Maternal Uncle    Depression Maternal Uncle    Asthma Maternal Uncle    Heart disease Paternal Uncle    Colon cancer Neg Hx    Esophageal cancer Neg Hx    Rectal cancer Neg Hx    Stomach cancer Neg Hx     Social History   Socioeconomic History   Marital status:  Married    Spouse name: Not on file   Number of children: 2   Years of education: 16   Highest education level: Bachelor's degree (e.g., BA, AB, BS)  Occupational History   Occupation: Retired  Tobacco Use   Smoking status: Former    Current packs/day: 0.00    Average packs/day: 1.5 packs/day for 15.0 years (22.5 ttl pk-yrs)    Types: Cigarettes    Start date: 10/06/1978    Quit date: 10/07/1978    Years since quitting: 45.9   Smokeless tobacco: Never   Tobacco comments:    QUIT 1980  Vaping Use   Vaping status: Never Used  Substance and Sexual Activity   Alcohol use: Yes    Alcohol/week: 7.0 standard drinks of alcohol    Types: 7 Glasses of wine per week    Comment: wine with meals   Drug use: Not Currently   Sexual activity: Yes    Comment: lives with wife, retired from chief strategy officer in [employer] , 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  Other Topics Concern   Not on file  Social History Narrative   Right handed    Occasionally caffeine   Retired   Lives with wife   05/31/24 has PCS 6 hours a day   Bachelor's degree   Social Drivers of Health   Tobacco Use: Medium Risk (08/30/2024)   Patient History    Smoking Tobacco Use: Former    Smokeless Tobacco Use: Never    Passive Exposure: Not on file  Financial Resource Strain: Low Risk (05/31/2024)   Overall Financial Resource Strain (CARDIA)    Difficulty of Paying Living Expenses: Not hard at all  Food Insecurity: No Food Insecurity (08/30/2024)   Epic    Worried About Programme Researcher, Broadcasting/film/video in the Last Year: Never true    Ran Out of Food in the Last Year: Never true  Transportation Needs: No Transportation Needs (08/30/2024)   Epic    Lack of Transportation (Medical): No    Lack of Transportation (Non-Medical): No  Physical Activity: Sufficiently Active (05/31/2024)   Exercise Vital Sign    Days of Exercise per Week: 5 days    Minutes of Exercise per Session: 50 min  Stress: No Stress  Concern Present (05/31/2024)   [employer] of Occupational Health - Occupational Stress Questionnaire    Feeling of Stress: Not at all  Social Connections: Moderately Isolated (05/31/2024)   Social Connection and Isolation Panel    Frequency of Communication with Friends and Family: More than three times a week  Frequency of Social Gatherings with Friends and Family: Once a week    Attends Religious Services: Never    Database Administrator or Organizations: No    Attends Banker Meetings: Never    Marital Status: Married  Catering Manager Violence: Not At Risk (08/30/2024)   Epic    Fear of Current or Ex-Partner: No    Emotionally Abused: No    Physically Abused: No    Sexually Abused: No  Depression (PHQ2-9): Low Risk (08/30/2024)   Depression (PHQ2-9)    PHQ-2 Score: 1  Recent Concern: Depression (PHQ2-9) - Medium Risk (07/01/2024)   Depression (PHQ2-9)    PHQ-2 Score: 6  Alcohol Screen: Low Risk (05/31/2024)   Alcohol Screen    Last Alcohol Screening Score (AUDIT): 4  Housing: Low Risk (08/30/2024)   Epic    Unable to Pay for Housing in the Last Year: No    Number of Times Moved in the Last Year: 0    Homeless in the Last Year: No  Utilities: Not At Risk (08/30/2024)   Epic    Threatened with loss of utilities: No  Health Literacy: Adequate Health Literacy (05/31/2024)   B1300 Health Literacy    Frequency of need for help with medical instructions: Never    Outpatient Medications Prior to Visit  Medication Sig Dispense Refill   acetaminophen  (TYLENOL ) 500 MG tablet Take 2 tablets (1,000 mg total) by mouth every 6 (six) hours as needed for moderate pain (pain score 4-6). 90 tablet 0   amiodarone  (PACERONE ) 200 MG tablet TAKE 1/2 TABLET BY MOUTH DAILY 45 tablet 3   benzonatate  (TESSALON ) 100 MG capsule TAKE 1-2 CAPSULES (100-200 MG TOTAL) BY MOUTH 2 (TWO) TIMES DAILY AS NEEDED FOR COUGH. 40 capsule 1   carbidopa -levodopa  (SINEMET   CR) 50-200 MG tablet Take 1 tablet by mouth at bedtime. 90 tablet 1   carbidopa -levodopa  (SINEMET  IR) 25-100 MG tablet TAKE 2 TABLETS BY MOUTH AT 7AM, 2 TABS AT 11AM, 1 TAB AT 3PM, AND 1 TAB AT 7 PM 540 tablet 1   cholecalciferol (VITAMIN D3) 25 MCG (1000 UNIT) tablet Take 2,000 Units by mouth daily.     divalproex  (DEPAKOTE ) 125 MG DR tablet TAKE 1 TABLET (125 MG TOTAL) BY MOUTH AT BEDTIME 90 tablet 3   ezetimibe  (ZETIA ) 10 MG tablet TAKE 1 TABLET BY MOUTH EVERY DAY 90 tablet 3   GEMTESA 75 MG TABS Take 1 tablet by mouth daily.     guaifenesin  (ROBITUSSIN) 100 MG/5ML syrup Take 10 mLs (200 mg total) by mouth 3 (three) times daily as needed for cough.     hydrochlorothiazide  (HYDRODIURIL ) 25 MG tablet TAKE 1 TABLET BY MOUTH EVERY DAY 90 tablet 1   levalbuterol  (XOPENEX  HFA) 45 MCG/ACT inhaler INHALE 1 TO 2 PUFFS BY MOUTH EVERY 6 HOURS AS NEEDED FOR WHEEZE 15 each 1   Melatonin 10 MG TABS Take 10 mg by mouth daily as needed.     Multiple Vitamins-Minerals (ICAPS AREDS 2 PO) Take 1 capsule by mouth in the morning and at bedtime.     pantoprazole  (PROTONIX ) 40 MG tablet Take 1 tablet (40 mg total) by mouth daily as needed.     polyethylene glycol (MIRALAX  / GLYCOLAX ) 17 g packet Take 17 g by mouth daily as needed for mild constipation. 14 each 0   senna (SENOKOT) 8.6 MG TABS tablet Take 1 tablet by mouth in the morning and at bedtime.     sertraline  (ZOLOFT ) 100 MG  tablet TAKE 1 TABLET BY MOUTH EVERY DAY 90 tablet 1   SYMBICORT  160-4.5 MCG/ACT inhaler INHALE 2 PUFFS INTO THE LUNGS TWICE A DAY 10.2 each 3   Vitamin D , Ergocalciferol , (DRISDOL ) 1.25 MG (50000 UNIT) CAPS capsule Take 1 capsule (50,000 Units total) by mouth every 7 (seven) days. 4 capsule 4   No facility-administered medications prior to visit.    Allergies[1]  Review of Systems  Constitutional:  Negative for fever and malaise/fatigue.  HENT:  Negative for congestion.   Eyes:  Negative for blurred vision.   Respiratory:  Negative for shortness of breath.   Cardiovascular:  Negative for chest pain, palpitations and leg swelling.  Gastrointestinal:  Negative for abdominal pain, blood in stool and nausea.  Genitourinary:  Negative for dysuria and frequency.  Musculoskeletal:  Negative for falls.  Skin:  Negative for rash.  Neurological:  Negative for dizziness, loss of consciousness and headaches.  Endo/Heme/Allergies:  Negative for environmental allergies.  Psychiatric/Behavioral:  Negative for depression. The patient is not nervous/anxious.        Objective:    Physical Exam Vitals reviewed.  Constitutional:      Appearance: Normal appearance. He is not ill-appearing.  HENT:     Head: Normocephalic and atraumatic.     Nose: Nose normal.  Eyes:     Conjunctiva/sclera: Conjunctivae normal.  Cardiovascular:     Rate and Rhythm: Normal rate.     Pulses: Normal pulses.     Heart sounds: Normal heart sounds. No murmur heard. Pulmonary:     Effort: Pulmonary effort is normal.     Breath sounds: Normal breath sounds. No wheezing.  Abdominal:     Palpations: Abdomen is soft. There is no mass.     Tenderness: There is no abdominal tenderness.  Musculoskeletal:     Cervical back: Normal range of motion.     Right lower leg: No edema.     Left lower leg: No edema.  Skin:    General: Skin is warm and dry.  Neurological:     General: No focal deficit present.     Mental Status: He is alert and oriented to person, place, and time.  Psychiatric:        Mood and Affect: Mood normal.    There were no vitals taken for this visit. Wt Readings from Last 3 Encounters:  07/30/24 153 lb (69.4 kg)  07/26/24 153 lb (69.4 kg)  07/01/24 160 lb (72.6 kg)    Diabetic Foot Exam - Simple   No data filed    Lab Results  Component Value Date   WBC 8.1 03/08/2024   HGB 13.5 03/08/2024   HCT 39.9 03/08/2024   PLT 224.0 03/08/2024   GLUCOSE 96 03/08/2024   CHOL 166 02/04/2024   TRIG 92.0  02/04/2024   HDL 69.30 02/04/2024   LDLCALC 79 02/04/2024   ALT 10 03/08/2024   AST 16 03/08/2024   NA 141 03/08/2024   K 3.6 03/08/2024   CL 102 03/08/2024   CREATININE 0.91 03/08/2024   BUN 12 03/08/2024   CO2 30 03/08/2024   TSH 1.74 02/04/2024   PSA 5.44 (H) 04/08/2024   INR 1.1 03/01/2024   HGBA1C 5.4 04/08/2024    Lab Results  Component Value Date   TSH 1.74 02/04/2024   Lab Results  Component Value Date   WBC 8.1 03/08/2024   HGB 13.5 03/08/2024   HCT 39.9 03/08/2024   MCV 94.1 03/08/2024   PLT 224.0 03/08/2024  Lab Results  Component Value Date   NA 141 03/08/2024   K 3.6 03/08/2024   CO2 30 03/08/2024   GLUCOSE 96 03/08/2024   BUN 12 03/08/2024   CREATININE 0.91 03/08/2024   BILITOT 0.6 03/08/2024   ALKPHOS 57 03/08/2024   AST 16 03/08/2024   ALT 10 03/08/2024   PROT 6.5 03/08/2024   ALBUMIN 4.2 03/08/2024   CALCIUM  9.7 03/08/2024   ANIONGAP 9 03/04/2024   EGFR 74 07/09/2023   GFR 78.27 03/08/2024   Lab Results  Component Value Date   CHOL 166 02/04/2024   Lab Results  Component Value Date   HDL 69.30 02/04/2024   Lab Results  Component Value Date   LDLCALC 79 02/04/2024   Lab Results  Component Value Date   TRIG 92.0 02/04/2024   Lab Results  Component Value Date   CHOLHDL 2 02/04/2024   Lab Results  Component Value Date   HGBA1C 5.4 04/08/2024       Assessment & Plan:  Parkinson's disease, unspecified whether dyskinesia present, unspecified whether manifestations fluctuate (HCC) Assessment & Plan: Lives at home with wife and is working closely with Neurology   Recurrent falls Assessment & Plan: No recent falls   PAF (paroxysmal atrial fibrillation) Assessment & Plan: Rate controlled   Mild neurocognitive disorder due to Parkinson's disease [facility]) Assessment & Plan: He notes his balance is worsening some. He is struggling with increasing falls and instability. Follows with neurology   Primary  hypertension Assessment & Plan: Well controlled, no changes to meds. Encouraged heart healthy diet such as the DASH diet and exercise as tolerated.     Mixed hyperlipidemia Assessment & Plan: Tolerating statin, encouraged heart healthy diet, avoid trans fats, minimize simple carbs and saturated fats. Increase exercise as tolerated   Hyperglycemia Assessment & Plan: hgba1c acceptable, minimize simple carbs. Increase exercise as tolerated.      Assessment and Plan Assessment & Plan      Harlene Horton, MD     [1] Allergies Allergen Reactions   Albuterol  Sulfate Palpitations   Azithromycin Other (See Comments)    Hepatotoxicity  Other Reaction(s): Hepatotoxicity   Doxycycline Hyclate Other (See Comments)    Taken with Azithromycin and had Heaptotoxicity

## 2024-09-19 NOTE — Assessment & Plan Note (Signed)
 Tolerating statin, encouraged heart healthy diet, avoid trans fats, minimize simple carbs and saturated fats. Increase exercise as tolerated

## 2024-09-19 NOTE — Assessment & Plan Note (Signed)
 No recent falls

## 2024-09-19 NOTE — Assessment & Plan Note (Signed)
 Lives at home with wife and is working closely with Neurology

## 2024-09-19 NOTE — Assessment & Plan Note (Signed)
 He notes his balance is worsening some. He is struggling with increasing falls and instability. Follows with neurology

## 2024-09-19 NOTE — Assessment & Plan Note (Signed)
 Rate controlled

## 2024-09-19 NOTE — Assessment & Plan Note (Signed)
 Well controlled, no changes to meds. Encouraged heart healthy diet such as the DASH diet and exercise as tolerated.

## 2024-09-23 ENCOUNTER — Ambulatory Visit: Admitting: Family Medicine

## 2024-09-23 ENCOUNTER — Other Ambulatory Visit: Payer: Self-pay | Admitting: Family Medicine

## 2024-09-23 ENCOUNTER — Encounter: Payer: Self-pay | Admitting: Family Medicine

## 2024-09-23 VITALS — BP 132/78 | HR 68 | Temp 97.8°F | Resp 16 | Ht 65.0 in

## 2024-09-23 DIAGNOSIS — E782 Mixed hyperlipidemia: Secondary | ICD-10-CM

## 2024-09-23 DIAGNOSIS — F0670 Mild neurocognitive disorder due to known physiological condition without behavioral disturbance: Secondary | ICD-10-CM

## 2024-09-23 DIAGNOSIS — I2510 Atherosclerotic heart disease of native coronary artery without angina pectoris: Secondary | ICD-10-CM | POA: Diagnosis not present

## 2024-09-23 DIAGNOSIS — I10 Essential (primary) hypertension: Secondary | ICD-10-CM

## 2024-09-23 DIAGNOSIS — G20A1 Parkinson's disease without dyskinesia, without mention of fluctuations: Secondary | ICD-10-CM

## 2024-09-23 DIAGNOSIS — R739 Hyperglycemia, unspecified: Secondary | ICD-10-CM | POA: Diagnosis not present

## 2024-09-23 DIAGNOSIS — R296 Repeated falls: Secondary | ICD-10-CM | POA: Diagnosis not present

## 2024-09-23 DIAGNOSIS — I480 Paroxysmal atrial fibrillation: Secondary | ICD-10-CM

## 2024-09-23 LAB — CBC WITH DIFFERENTIAL/PLATELET
Basophils Absolute: 0.1 K/uL (ref 0.0–0.1)
Basophils Relative: 1.2 % (ref 0.0–3.0)
Eosinophils Absolute: 0.2 K/uL (ref 0.0–0.7)
Eosinophils Relative: 2.3 % (ref 0.0–5.0)
HCT: 39.9 % (ref 39.0–52.0)
Hemoglobin: 13.4 g/dL (ref 13.0–17.0)
Lymphocytes Relative: 16.9 % (ref 12.0–46.0)
Lymphs Abs: 1.3 K/uL (ref 0.7–4.0)
MCHC: 33.7 g/dL (ref 30.0–36.0)
MCV: 94.5 fl (ref 78.0–100.0)
Monocytes Absolute: 0.8 K/uL (ref 0.1–1.0)
Monocytes Relative: 10.5 % (ref 3.0–12.0)
Neutro Abs: 5.1 K/uL (ref 1.4–7.7)
Neutrophils Relative %: 69.1 % (ref 43.0–77.0)
Platelets: 152 K/uL (ref 150.0–400.0)
RBC: 4.23 Mil/uL (ref 4.22–5.81)
RDW: 14.5 % (ref 11.5–15.5)
WBC: 7.4 K/uL (ref 4.0–10.5)

## 2024-09-23 LAB — COMPREHENSIVE METABOLIC PANEL WITH GFR
ALT: 4 U/L (ref 3–53)
AST: 9 U/L (ref 5–37)
Albumin: 4.2 g/dL (ref 3.5–5.2)
Alkaline Phosphatase: 64 U/L (ref 39–117)
BUN: 19 mg/dL (ref 6–23)
CO2: 31 meq/L (ref 19–32)
Calcium: 9.2 mg/dL (ref 8.4–10.5)
Chloride: 103 meq/L (ref 96–112)
Creatinine, Ser: 0.83 mg/dL (ref 0.40–1.50)
GFR: 80.96 mL/min (ref >60.00)
Glucose, Bld: 89 mg/dL (ref 70–99)
Potassium: 3.6 meq/L (ref 3.5–5.1)
Sodium: 141 meq/L (ref 135–145)
Total Bilirubin: 0.6 mg/dL (ref 0.2–1.2)
Total Protein: 6.5 g/dL (ref 6.0–8.3)

## 2024-09-23 LAB — LIPID PANEL
Cholesterol: 159 mg/dL (ref 28–200)
HDL: 68.5 mg/dL (ref >39.00)
LDL Cholesterol: 74 mg/dL (ref 10–99)
NonHDL: 90.13
Total CHOL/HDL Ratio: 2
Triglycerides: 82 mg/dL (ref 10.0–149.0)
VLDL: 16.4 mg/dL (ref 0.0–40.0)

## 2024-09-23 LAB — HEMOGLOBIN A1C: Hgb A1c MFr Bld: 5.1 % (ref 4.6–6.5)

## 2024-09-23 MED ORDER — SERTRALINE HCL 200 MG PO CAPS: 1.0000 | ORAL_CAPSULE | Freq: Every day | ORAL | 1 refills | Status: DC

## 2024-09-23 NOTE — Telephone Encounter (Signed)
 Needs PA for sertraline  200mg  please.

## 2024-09-24 ENCOUNTER — Telehealth: Payer: Self-pay

## 2024-09-24 ENCOUNTER — Ambulatory Visit: Payer: Self-pay | Admitting: Family Medicine

## 2024-09-24 ENCOUNTER — Other Ambulatory Visit (HOSPITAL_COMMUNITY): Payer: Self-pay

## 2024-09-24 ENCOUNTER — Other Ambulatory Visit: Payer: Self-pay | Admitting: Family Medicine

## 2024-09-24 NOTE — Telephone Encounter (Signed)
 Yes ma'am, she was asking if you could change it to the 100 mg tabs, take 2 tabs daily #180 because the copay will be $0 for the patient and no PA is required.

## 2024-09-24 NOTE — Telephone Encounter (Signed)
 Pharmacy Patient Advocate Encounter   Received notification from RX Request Messages that prior authorization for Sertraline  200mg  caps is required/requested.   Insurance verification completed.   The patient is insured through [employer].   Per test claim:  Fluoxetine, Citalopram, Escitalopram , Oxalate, Paroxetine is preferred by the insurance.  If suggested medication is appropriate, Please send in a new RX and discontinue this one. If not, please advise as to why it's not appropriate so that we may request a Prior Authorization. Please note, some preferred medications may still require a PA.  If the suggested medications have not been trialed and there are no contraindications to their use, the PA will not be submitted, as it will not be approved.   I also tried a test claim for Sertraline  100mg  tabs to take 2 tablets (200mg ) by mouth daily #180 and the copay came back as $0.   Would you like to change to the tablets? A prior shara is not required for the tabs.

## 2024-09-26 ENCOUNTER — Encounter: Payer: Self-pay | Admitting: Family Medicine

## 2024-09-26 ENCOUNTER — Other Ambulatory Visit: Payer: Self-pay | Admitting: Family Medicine

## 2024-09-26 MED ORDER — SERTRALINE HCL 100 MG PO TABS: 200.0000 mg | ORAL_TABLET | Freq: Every day | ORAL | 1 refills | Status: DC

## 2024-09-28 ENCOUNTER — Ambulatory Visit (HOSPITAL_BASED_OUTPATIENT_CLINIC_OR_DEPARTMENT_OTHER)
Admission: RE | Admit: 2024-09-28 | Discharge: 2024-09-28 | Disposition: A | Discharge status: Home / Self Care | Source: Ambulatory Visit | Attending: Family | Admitting: Family

## 2024-09-28 ENCOUNTER — Encounter: Payer: Self-pay | Admitting: Family

## 2024-09-28 ENCOUNTER — Telehealth: Payer: Self-pay

## 2024-09-28 ENCOUNTER — Ambulatory Visit: Admitting: Family

## 2024-09-28 VITALS — BP 142/84 | HR 72 | Temp 98.5°F | Resp 14 | Ht 65.0 in | Wt 160.0 lb

## 2024-09-28 DIAGNOSIS — M25552 Pain in left hip: Secondary | ICD-10-CM

## 2024-09-28 MED ORDER — TRAMADOL HCL 50 MG PO TABS: 50.0000 mg | ORAL_TABLET | Freq: Three times a day (TID) | ORAL | 0 refills | Status: AC | PRN

## 2024-09-28 NOTE — Patient Instructions (Signed)
 [name] - I am sorry I was unable to reach you today for our scheduled appointment. I work with Domenica Harlene LABOR, MD and am calling to support your healthcare needs. Please contact me at [phone] at your earliest convenience. I look forward to speaking with you soon.   Thank you,  Hendricks Her RN, BSN  [place] [facility] RN Case Manager   Direct [phone]

## 2024-09-28 NOTE — Telephone Encounter (Signed)
 Provider changed to Sertraline  100mg  tabs to take 2 tabs (200mg ) daily.

## 2024-09-28 NOTE — Progress Notes (Signed)
 "  Acute Office Visit  Subjective:     Patient ID: Walter Brown, male    DOB: 11-15-1940, 83 y.o.   MRN: [id number]  Chief Complaint  Patient presents with   Hip Pain    Patient has left hip pain, for 2 days. Sharp pain    HPI Patient is in today with complaints of left hip pain over the past 2 days.  Describes the pain as sharp, worse with standing.  Denies any injury.  Has been applying a heating pad to the area that has been helpful.  Review of Systems  Musculoskeletal:  Negative for back pain and falls.       Left hip pain  Neurological: Negative.  Negative for weakness.  All other systems reviewed and are negative.   Past Medical History:  Diagnosis Date   A-fib (HCC)    Allergy 10/07/1944   Anemia 02/07/2018   mild   Arthritis 04/06/2017   Asthma    childhood   ASVD (arteriosclerotic vascular disease) 09/07/2018   Back pain with radiation 04/26/2013   Low back with LLE radiculopathy   Basal cell carcinoma    skin- on nose- basal cell (20 yrs ago) forehead 1 year ago     Sees Dr Darice Reusing of dermatology   BPH (benign prostatic hyperplasia)    Bradycardia 06/28/2018   Cataract 10/07/2014   Cerumen impaction 08/10/2012   Chicken pox as child   Coronary artery calcification 11/23/2019   Dysphagia 09/11/2021   Elevated LFTs 11/2017   suspected drug/antibiotic induced   Essential hypertension    ETD (Eustachian tube dysfunction), right 01/17/2022   Generalized anxiety disorder    GERD (gastroesophageal reflux disease)    German measles as a child   Hemoptysis 01/17/2022   Hyperglycemia 04/25/2020   Hyperlipidemia    Insomnia 02/06/2021   Internal hemorrhoids    Irregular cardiac rhythm 03/10/2018   Lipoma 08/04/2018   Macular degeneration    Major depressive disorder 08/01/2012   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   Nocturia 12/04/2022   Obstructive sleep apnea 11/09/2018   Patient  reports mild symptoms; was not prescribed a CPAP machine   Otitis externa 08/10/2012   PAF (paroxysmal atrial fibrillation)    Palpitations 09/07/2018   Parkinson's disease (HCC) 05/19/2019   Recurrent falls 12/04/2022   Stroke (HCC) 02/27/2018   Thrombocytopenia    TIA (transient ischemic attack) 03/10/2018   Unsteady gait 09/11/2021    Social History   Socioeconomic History   Marital status: Married    Spouse name: Not on file   Number of children: 2   Years of education: 16   Highest education level: Bachelor's degree (e.g., BA, AB, BS)  Occupational History   Occupation: Retired  Tobacco Use   Smoking status: Former    Current packs/day: 0.00    Average packs/day: 1.5 packs/day for 15.0 years (22.5 ttl pk-yrs)    Types: Cigarettes    Start date: 10/06/1978    Quit date: 10/07/1978    Years since quitting: 46.0   Smokeless tobacco: Never   Tobacco comments:    QUIT 1980  Vaping Use   Vaping status: Never Used  Substance and Sexual Activity   Alcohol use: Yes    Alcohol/week: 7.0 standard drinks of alcohol    Types: 7 Glasses of wine per week    Comment: wine with meals   Drug use: Not Currently   Sexual activity: Yes  Comment: lives with wife, retired from chief strategy officer in [employer] , 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  Other Topics Concern   Not on file  Social History Narrative   Right handed    Occasionally caffeine   Retired   Lives with wife   05/31/24 has PCS 6 hours a day   Bachelor's degree   Social Drivers of Health   Tobacco Use: Medium Risk (09/28/2024)   Patient History    Smoking Tobacco Use: Former    Smokeless Tobacco Use: Never    Passive Exposure: Not on file  Financial Resource Strain: Low Risk (05/31/2024)   Overall Financial Resource Strain (CARDIA)    Difficulty of Paying Living Expenses: Not hard at all  Food Insecurity: No Food Insecurity (08/30/2024)   Epic    Worried About Brewing Technologist in the Last Year: Never true    Ran Out of Food in the Last Year: Never true  Transportation Needs: No Transportation Needs (08/30/2024)   Epic    Lack of Transportation (Medical): No    Lack of Transportation (Non-Medical): No  Physical Activity: Sufficiently Active (05/31/2024)   Exercise Vital Sign    Days of Exercise per Week: 5 days    Minutes of Exercise per Session: 50 min  Stress: No Stress Concern Present (05/31/2024)   [employer] of Occupational Health - Occupational Stress Questionnaire    Feeling of Stress: Not at all  Social Connections: Moderately Isolated (05/31/2024)   Social Connection and Isolation Panel    Frequency of Communication with Friends and Family: More than three times a week    Frequency of Social Gatherings with Friends and Family: Once a week    Attends Religious Services: Never    Database Administrator or Organizations: No    Attends Banker Meetings: Never    Marital Status: Married  Catering Manager Violence: Not At Risk (08/30/2024)   Epic    Fear of Current or Ex-Partner: No    Emotionally Abused: No    Physically Abused: No    Sexually Abused: No  Depression (PHQ2-9): High Risk (09/23/2024)   Depression (PHQ2-9)    PHQ-2 Score: 13  Alcohol Screen: Low Risk (05/31/2024)   Alcohol Screen    Last Alcohol Screening Score (AUDIT): 4  Housing: Low Risk (08/30/2024)   Epic    Unable to Pay for Housing in the Last Year: No    Number of Times Moved in the Last Year: 0    Homeless in the Last Year: No  Utilities: Not At Risk (08/30/2024)   Epic    Threatened with loss of utilities: No  Health Literacy: Adequate Health Literacy (05/31/2024)   B1300 Health Literacy    Frequency of need for help with medical instructions: Never    Past Surgical History:  Procedure Laterality Date   BELPHAROPTOSIS REPAIR     very young, b/l   EXCISIONAL HEMORRHOIDECTOMY     EYE SURGERY  2017   eyelids    HYDROCELE EXCISION / REPAIR  2012   b/l   SHUNT REVISION VENTRICULAR-PERITONEAL Right 03/31/2024   Procedure: REVISION, SHUNT, VENTRICULOPERITONEAL;  Surgeon: Mavis Purchase, MD;  Location: [facility] OR;  Service: Neurosurgery;  Laterality: Right;  SHUNT REVISION   SKIN CANCER EXCISION     nose and forehead, basal cell CA   UPPER GASTROINTESTINAL ENDOSCOPY     VENTRICULOPERITONEAL SHUNT Right 03/29/2024   Procedure: SHUNT INSERTION VENTRICULAR-PERITONEAL;  Surgeon: Mavis Purchase, MD;  Location: [facility]  OR;  Service: Neurosurgery;  Laterality: Right;  RT VP SHUNT PLACEMENT    Family History  Problem Relation Age of Onset   Hypertension Mother    Other Mother        MRSA   Heart disease Mother        stents   ADD / ADHD Mother    Arthritis Mother    Asthma Mother    Depression Mother    Vision loss Mother    Heart disease Father    Hypertension Father    Prostate cancer Father 8   Parkinson's disease Father    Cancer Father 38   Hearing loss Father    Stroke Father    Vision loss Father    Varicose Veins Father    Stroke Maternal Grandmother    Prostate cancer Maternal Grandfather    Asthma Maternal Grandfather    Cancer Maternal Grandfather    Cancer Paternal Grandfather        EYE   Hearing loss Paternal Grandfather    ADD / ADHD Son        ADHD   Other Son 24       part of 1 lung removed- due to infection   Alcohol abuse Son    Depression Son    Alcohol abuse Son    Alcohol abuse Maternal Uncle    Depression Maternal Uncle    Asthma Maternal Uncle    Heart disease Paternal Uncle    Colon cancer Neg Hx    Esophageal cancer Neg Hx    Rectal cancer Neg Hx    Stomach cancer Neg Hx     Allergies[1]  Medications Ordered Prior to Encounter[2]  BP (!) 142/84 (BP Location: Left Arm, Patient Position: Sitting, Cuff Size: Normal)   Pulse 72   Temp 98.5 F (36.9 C) (Oral)   Resp 14   Ht 5' 5 (1.651 m)   Wt 160 lb (72.6 kg)    SpO2 94%   BMI 26.63 kg/m chart     Objective:    BP (!) 142/84 (BP Location: Left Arm, Patient Position: Sitting, Cuff Size: Normal)   Pulse 72   Temp 98.5 F (36.9 C) (Oral)   Resp 14   Ht 5' 5 (1.651 m)   Wt 160 lb (72.6 kg)   SpO2 94%   BMI 26.63 kg/m    Physical Exam Vitals and nursing note reviewed.  Constitutional:      Appearance: Normal appearance. He is normal weight.  Cardiovascular:     Rate and Rhythm: Normal rate and regular rhythm.  Pulmonary:     Effort: Pulmonary effort is normal.     Breath sounds: Normal breath sounds.  Musculoskeletal:        General: Tenderness present. No swelling.     Cervical back: Normal range of motion and neck supple.     Comments: Left hip: Pain elicited with abduction of the hip and with palpation of the greater trochanter.  No bruising or signs of any injury  Skin:    General: Skin is warm and dry.  Neurological:     General: No focal deficit present.     Mental Status: He is alert and oriented to person, place, and time. Mental status is at baseline.  Psychiatric:        Mood and Affect: Mood normal.        Behavior: Behavior normal.    No results found for any visits on 09/28/24.  Assessment & Plan:   Problem List Items Addressed This Visit   None Visit Diagnoses       Left hip pain    -  Primary   Relevant Orders   DG Hip Unilat W OR W/O Pelvis 2-3 Views Left       Meds ordered this encounter  Medications   traMADol  (ULTRAM ) 50 MG tablet    Sig: Take 1 tablet (50 mg total) by mouth every 8 (eight) hours as needed for up to 5 days.    Dispense:  15 tablet    Refill:  0    Supervising Provider:   DOMENICA BLACKBIRD A [[id number]]   Call the office if symptoms worsen or persist.  Recheck as scheduled and sooner as needed.  Pain medication is only to be used if pain worsens.  Tylenol  as needed for pain. No follow-ups on file.  Kieana Livesay B Enora Trillo, FNP       [1] Allergies Allergen Reactions   Albuterol   Sulfate Palpitations   Azithromycin Other (See Comments)    Hepatotoxicity  Other Reaction(s): Hepatotoxicity   Doxycycline Hyclate Other (See Comments)    Taken with Azithromycin and had Heaptotoxicity  [2] Current Outpatient Medications on File Prior to Visit  Medication Sig Dispense Refill   acetaminophen  (TYLENOL ) 500 MG tablet Take 2 tablets (1,000 mg total) by mouth every 6 (six) hours as needed for moderate pain (pain score 4-6). 90 tablet 0   amiodarone  (PACERONE ) 200 MG tablet TAKE 1/2 TABLET BY MOUTH DAILY 45 tablet 3   benzonatate  (TESSALON ) 100 MG capsule TAKE 1-2 CAPSULES (100-200 MG TOTAL) BY MOUTH 2 (TWO) TIMES DAILY AS NEEDED FOR COUGH. 40 capsule 1   carbidopa -levodopa  (SINEMET  CR) 50-200 MG tablet Take 1 tablet by mouth at bedtime. 90 tablet 1   carbidopa -levodopa  (SINEMET  IR) 25-100 MG tablet TAKE 2 TABLETS BY MOUTH AT 7AM, 2 TABS AT 11AM, 1 TAB AT 3PM, AND 1 TAB AT 7 PM 540 tablet 1   cholecalciferol (VITAMIN D3) 25 MCG (1000 UNIT) tablet Take 2,000 Units by mouth daily.     divalproex  (DEPAKOTE ) 125 MG DR tablet TAKE 1 TABLET (125 MG TOTAL) BY MOUTH AT BEDTIME 90 tablet 3   ezetimibe  (ZETIA ) 10 MG tablet TAKE 1 TABLET BY MOUTH EVERY DAY 90 tablet 3   GEMTESA 75 MG TABS Take 1 tablet by mouth daily.     guaifenesin  (ROBITUSSIN) 100 MG/5ML syrup Take 10 mLs (200 mg total) by mouth 3 (three) times daily as needed for cough.     hydrochlorothiazide  (HYDRODIURIL ) 25 MG tablet TAKE 1 TABLET BY MOUTH EVERY DAY 90 tablet 1   levalbuterol  (XOPENEX  HFA) 45 MCG/ACT inhaler INHALE 1 TO 2 PUFFS BY MOUTH EVERY 6 HOURS AS NEEDED FOR WHEEZE 15 each 1   Melatonin 10 MG TABS Take 10 mg by mouth daily as needed.     Multiple Vitamins-Minerals (ICAPS AREDS 2 PO) Take 1 capsule by mouth in the morning and at bedtime.     pantoprazole  (PROTONIX ) 40 MG tablet Take 1 tablet (40 mg total) by mouth daily as needed.     polyethylene glycol (MIRALAX  / GLYCOLAX ) 17 g packet Take 17  g by mouth daily as needed for mild constipation. 14 each 0   senna (SENOKOT) 8.6 MG TABS tablet Take 1 tablet by mouth in the morning and at bedtime.     sertraline  (ZOLOFT ) 100 MG tablet Take 2 tablets (200 mg total) by mouth daily. 180 tablet 1  SYMBICORT  160-4.5 MCG/ACT inhaler INHALE 2 PUFFS INTO THE LUNGS TWICE A DAY 10.2 each 3   Vitamin D , Ergocalciferol , (DRISDOL ) 1.25 MG (50000 UNIT) CAPS capsule Take 1 capsule (50,000 Units total) by mouth every 7 (seven) days. 4 capsule 4   No current facility-administered medications on file prior to visit.  "

## 2024-10-01 ENCOUNTER — Other Ambulatory Visit: Payer: Self-pay

## 2024-10-01 NOTE — Patient Instructions (Signed)
 Visit Information  Thank you for taking time to visit with me today. Please don't hesitate to contact me if I can be of assistance to you before our next scheduled appointment.  Your next care management appointment is by telephone on 11/02/2024 at 11:00 AM   Telephone follow-up in 1 month  Please call the care guide team at [phone] if you need to cancel, schedule, or reschedule an appointment.   Please call the Suicide and Crisis Lifeline: 988 call the [place]  National Suicide Prevention Lifeline: (915)746-3027 or TTY: 4142427681 TTY 563-508-1564) to talk to a trained counselor call 1-800-273-TALK (toll free, 24 hour hotline) go to [facility] [address] [place] [phone]) call 911 if you are experiencing a Mental Health or Behavioral Health Crisis or need someone to talk to.  Hendricks Her RN, BSN  [place] [facility] VBCI-Population Health RN Case Information Systems Manager [phone]

## 2024-10-01 NOTE — Patient Outreach (Signed)
 Complex Care Management   Visit Note  10/01/2024  Name:  Walter Brown MRN: [id number] DOB: 12-11-1940  Situation: Referral received for Complex Care Management related to health management of Parkinson's I obtained verbal consent from Wife (Caregiver).  Visit completed with Wife ( Caregiver ) on the phone  Background:   Past Medical History:  Diagnosis Date   A-fib [facility])    Allergy 10/07/1944   Anemia 02/07/2018   mild   Arthritis 04/06/2017   Asthma    childhood   ASVD (arteriosclerotic vascular disease) 09/07/2018   Back pain with radiation 04/26/2013   Low back with LLE radiculopathy   Basal cell carcinoma    skin- on nose- basal cell (20 yrs ago) forehead 1 year ago     Sees Dr Darice Reusing of dermatology   BPH (benign prostatic hyperplasia)    Bradycardia 06/28/2018   Cataract 10/07/2014   Cerumen impaction 08/10/2012   Chicken pox as child   Coronary artery calcification 11/23/2019   Dysphagia 09/11/2021   Elevated LFTs 11/2017   suspected drug/antibiotic induced   Essential hypertension    ETD (Eustachian tube dysfunction), right 01/17/2022   Generalized anxiety disorder    GERD (gastroesophageal reflux disease)    German measles as a child   Hemoptysis 01/17/2022   Hyperglycemia 04/25/2020   Hyperlipidemia    Insomnia 02/06/2021   Internal hemorrhoids    Irregular cardiac rhythm 03/10/2018   Lipoma 08/04/2018   Macular degeneration    Major depressive disorder 08/01/2012   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   Nocturia 12/04/2022   Obstructive sleep apnea 11/09/2018   Patient reports mild symptoms; was not prescribed a CPAP machine   Otitis externa 08/10/2012   PAF (paroxysmal atrial fibrillation)    Palpitations 09/07/2018   Parkinson's disease (HCC) 05/19/2019   Recurrent falls 12/04/2022   Stroke (HCC) 02/27/2018   Thrombocytopenia    TIA (transient ischemic attack) 03/10/2018   Unsteady gait 09/11/2021     Assessment: Patient Reported Symptoms:  Cognitive Cognitive Status: Difficulties with attention and concentration, Confused or disoriented, Requires Assistance Decision Making, Struggling with memory recall Cognitive/Intellectual Conditions Management [RPT]: None reported or documented in medical history or problem list   Health Maintenance Behaviors: Sleep adequate, Annual physical exam Healing Pattern: Slow Health Facilitated by: Rest  Neurological Neurological Review of Symptoms: No symptoms reported Neurological Management Strategies: Adequate rest, Routine screening, Coping strategies Neurological Self-Management Outcome: 4 (good)  HEENT HEENT Symptoms Reported: No symptoms reported HEENT Management Strategies: Medical device, Medication therapy HEENT Self-Management Outcome: 4 (good) Vision problem(s)  Cardiovascular Cardiovascular Symptoms Reported: No symptoms reported Does patient have uncontrolled Hypertension?: No Cardiovascular Management Strategies: Adequate rest, Coping strategies, Routine screening Weight: 160 lb (72.6 kg) Cardiovascular Self-Management Outcome: 3 (uncertain) Cardiovascular Comment: hasnt started weighing  Will try for two times a week  Respiratory Respiratory Symptoms Reported: Dry cough, Productive cough Other Respiratory Symptoms: Both  Sometimes Wife thinks its sinus drainage Respiratory Management Strategies: Adequate rest, Medication therapy, Coping strategies, Routine screening Respiratory Self-Management Outcome: 4 (good)  Endocrine Endocrine Symptoms Reported: No symptoms reported Is patient diabetic?: No Is patient checking blood sugars at home?: No Endocrine Self-Management Outcome: 4 (good)  Gastrointestinal Gastrointestinal Symptoms Reported: Incontinence Gastrointestinal Management Strategies: Incontinence garment/pad Gastrointestinal Self-Management Outcome: 4 (good)    Genitourinary Genitourinary Symptoms Reported:  Incontinence Genitourinary Management Strategies: Coping strategies, Incontinence garment/pad Genitourinary Self-Management Outcome: 4 (good)  Integumentary Integumentary Symptoms Reported: Bruising Additional Integumentary Details: Wife states  easily bruises Skin Management Strategies: Coping strategies, Routine screening Skin Self-Management Outcome: 4 (good)  Musculoskeletal Musculoskelatal Symptoms Reviewed: Difficulty walking, Limited mobility, Unsteady gait Additional Musculoskeletal Details: Walker/ scooter/ PT from [facility] Musculoskeletal Management Strategies: Adequate rest, Coping strategies, Medication therapy Musculoskeletal Self-Management Outcome: 4 (good) Falls in the past year?: Yes Number of falls in past year: 1 or less Was there an injury with Fall?: No Fall Risk Category Calculator: 1 Patient Fall Risk Level: Low Fall Risk Patient at Risk for Falls Due to: History of fall(s) Fall risk Follow up: Falls evaluation completed, Education provided  Psychosocial Psychosocial Symptoms Reported: Depression - if selected complete PHQ 2-9 Additional Psychological Details: Wife states he tell her he's depressed Behavioral Management Strategies: Adequate rest Behavioral Health Self-Management Outcome: 3 (uncertain) Major Change/Loss/Stressor/Fears (CP): Medical condition, self Techniques to Cope with Loss/Stress/Change: Diversional activities, Meditation Quality of Family Relationships: helpful, involved, supportive Do you feel physically threatened by others?: No    10/01/2024    PHQ2-9 Depression Screening   Little interest or pleasure in doing things Not at all  Feeling down, depressed, or hopeless Several days  PHQ-2 - Total Score 1  Trouble falling or staying asleep, or sleeping too much    Feeling tired or having little energy    Poor appetite or overeating     Feeling bad about yourself - or that you are a failure or have let yourself or your family down    Trouble  concentrating on things, such as reading the newspaper or watching television    Moving or speaking so slowly that other people could have noticed.  Or the opposite - being so fidgety or restless that you have been moving around a lot more than usual    Thoughts that you would be better off dead, or hurting yourself in some way    PHQ2-9 Total Score    If you checked off any problems, how difficult have these problems made it for you to do your work, take care of things at home, or get along with other people    Depression Interventions/Treatment      Today's Vitals   10/01/24 1108  BP: (!) 142/84  Weight: 160 lb (72.6 kg)  Height: 5' 5 (1.651 m)   Pain Scale: 0-10 Pain Score: 0-No pain  Medications Reviewed Today     Reviewed by Kay Hendricks MATSU, RN (Case Manager) on 10/01/24 at 1104  Med List Status: <None>   Medication Order Taking? Sig Documenting Provider Last Dose Status Informant  acetaminophen  (TYLENOL ) 500 MG tablet [id number] Yes Take 2 tablets (1,000 mg total) by mouth every 6 (six) hours as needed for moderate pain (pain score 4-6). Nivia Colon, PA-C  Active Spouse/Significant Other, Care Giver  amiodarone  (PACERONE ) 200 MG tablet [id number] Yes TAKE 1/2 TABLET BY MOUTH DAILY Court Dorn PARAS, MD  Active Spouse/Significant Other, Care Giver  benzonatate  (TESSALON ) 100 MG capsule [id number] Yes TAKE 1-2 CAPSULES (100-200 MG TOTAL) BY MOUTH 2 (TWO) TIMES DAILY AS NEEDED FOR COUGH. Webb, Padonda B, FNP  Active   carbidopa -levodopa  (SINEMET  CR) 50-200 MG tablet [id number] Yes Take 1 tablet by mouth at bedtime. Evonnie Asberry RAMAN, DO  Active   carbidopa -levodopa  (SINEMET  IR) 25-100 MG tablet [id number] Yes TAKE 2 TABLETS BY MOUTH AT 7AM, 2 TABS AT 11AM, 1 TAB AT 3PM, AND 1 TAB AT 7 PM Tat, Rebecca S, DO  Active   cholecalciferol (VITAMIN D3) 25 MCG (1000 UNIT) tablet [id number] Yes Take 2,000 Units by  mouth daily. [provider]  Active Spouse/Significant Other, Care Giver   divalproex  (DEPAKOTE ) 125 MG DR tablet [id number] Yes TAKE 1 TABLET (125 MG TOTAL) BY MOUTH AT BEDTIME Wertman, Sara E, PA-C  Active Spouse/Significant Other, Care Giver  ezetimibe  (ZETIA ) 10 MG tablet [id number] Yes TAKE 1 TABLET BY MOUTH EVERY DAY Court Dorn PARAS, MD  Active   GEMTESA 75 MG TABS [id number] Yes Take 1 tablet by mouth daily. [provider]  Active   guaifenesin  (ROBITUSSIN) 100 MG/5ML syrup [id number] Yes Take 10 mLs (200 mg total) by mouth 3 (three) times daily as needed for cough. Paz, Jose E, MD  Active Spouse/Significant Other, Care Giver  hydrochlorothiazide  (HYDRODIURIL ) 25 MG tablet [id number] Yes TAKE 1 TABLET BY MOUTH EVERY DAY Domenica Harlene LABOR, MD  Active   levalbuterol  (XOPENEX  HFA) 45 MCG/ACT inhaler [id number] Yes INHALE 1 TO 2 PUFFS BY MOUTH EVERY 6 HOURS AS NEEDED FOR WHEEZE Domenica Harlene LABOR, MD  Active Spouse/Significant Other, Care Giver  Melatonin 10 MG TABS [id number] Yes Take 10 mg by mouth daily as needed. [provider]  Active Spouse/Significant Other, Care Giver  Multiple Vitamins-Minerals (ICAPS AREDS 2 PO) [id number] Yes Take 1 capsule by mouth in the morning and at bedtime. [provider]  Active Spouse/Significant Other, Care Giver  pantoprazole  (PROTONIX ) 40 MG tablet [id number] Yes Take 1 tablet (40 mg total) by mouth daily as needed. Daryl Setter, NP  Active Spouse/Significant Other, Care Giver  polyethylene glycol (MIRALAX  / GLYCOLAX ) 17 g packet [id number] Yes Take 17 g by mouth daily as needed for mild constipation. Bergman, Meghan D, NP  Active   senna (SENOKOT) 8.6 MG TABS tablet [id number] Yes Take 1 tablet by mouth in the morning and at bedtime. [provider]  Active Spouse/Significant Other, Care Giver  sertraline  (ZOLOFT ) 100 MG tablet [id number] Yes Take 2 tablets (200 mg total) by mouth daily. Domenica Harlene LABOR, MD  Active   SYMBICORT  160-4.5 MCG/ACT inhaler [id number] Yes INHALE 2 PUFFS INTO THE LUNGS TWICE A  DAY Webb, Padonda B, FNP  Active   traMADol  (ULTRAM ) 50 MG tablet [id number] Yes Take 1 tablet (50 mg total) by mouth every 8 (eight) hours as needed for up to 5 days. Webb, Padonda B, FNP  Active   Vitamin D , Ergocalciferol , (DRISDOL ) 1.25 MG (50000 UNIT) CAPS capsule [id number] Yes Take 1 capsule (50,000 Units total) by mouth every 7 (seven) days. Domenica Harlene LABOR, MD  Active             Recommendation:   Continue Current Plan of Care  Follow Up Plan:   Telephone follow-up in 1 month  Hendricks Her RN, BSN  [place] [facility] VBCI-Population Health RN Case Manager   Direct [phone]

## 2024-10-08 ENCOUNTER — Telehealth: Payer: Self-pay

## 2024-10-08 NOTE — Telephone Encounter (Signed)
 Returned Marinda w/ [facility] and left a message with the phone team for him to return my call.

## 2024-10-08 NOTE — Telephone Encounter (Signed)
 Copied from CRM #[id number]. Topic: Clinical - Home Health Verbal Orders >> Oct 06, 2024  2:57 PM Harlene ORN wrote: Walter Brown [facility]  Discharged him a couple of weeks ago. Patient says that PCP is requesting him to have physical paper orders be faxed over to them for physical therapy.  Phone: [phone]

## 2024-10-10 ENCOUNTER — Encounter: Payer: Self-pay | Admitting: Cardiovascular Disease

## 2024-10-13 ENCOUNTER — Telehealth: Payer: Self-pay | Admitting: Physician Assistant

## 2024-10-13 ENCOUNTER — Telehealth: Payer: Self-pay | Admitting: Neurology

## 2024-10-13 ENCOUNTER — Encounter: Payer: Self-pay | Admitting: Cardiovascular Disease

## 2024-10-13 ENCOUNTER — Ambulatory Visit: Payer: Self-pay | Admitting: Family

## 2024-10-13 DIAGNOSIS — M25552 Pain in left hip: Secondary | ICD-10-CM

## 2024-10-13 NOTE — Telephone Encounter (Signed)
 Looks negative but not reviewed by provider yet.    Copied from CRM [id number]. Topic: Clinical - Lab/Test Results >> Oct 13, 2024  3:50 PM Deleta RAMAN wrote: Reason for CRM: patient would like results from his xray on hip. Please contact the patient regarding xray [phone]

## 2024-10-13 NOTE — Telephone Encounter (Signed)
 Pt called in this afternoon and he stated that he is returning a call. Thanks

## 2024-10-14 NOTE — Addendum Note (Signed)
 Addended by: Deissy Guilbert D on: 10/14/2024 11:54 AM   Modules accepted: Orders

## 2024-10-14 NOTE — Telephone Encounter (Signed)
 Pt informed of results, requesting ortho referral. Referral placed.

## 2024-10-15 ENCOUNTER — Telehealth: Payer: Self-pay | Admitting: Family Medicine

## 2024-10-15 ENCOUNTER — Other Ambulatory Visit: Payer: Self-pay | Admitting: Family Medicine

## 2024-10-15 DIAGNOSIS — G20A1 Parkinson's disease without dyskinesia, without mention of fluctuations: Secondary | ICD-10-CM

## 2024-10-15 DIAGNOSIS — R2681 Unsteadiness on feet: Secondary | ICD-10-CM

## 2024-10-15 DIAGNOSIS — G912 (Idiopathic) normal pressure hydrocephalus: Secondary | ICD-10-CM

## 2024-10-15 DIAGNOSIS — M25552 Pain in left hip: Secondary | ICD-10-CM

## 2024-10-15 NOTE — Telephone Encounter (Signed)
 Copied from CRM [id number]. Topic: Referral - Request for Referral >> Oct 15, 2024 10:54 AM Roselie BROCKS wrote: Did the patient discuss referral with their provider in the last year? Yes (If No - schedule appointment) (If Yes - send message)  Appointment offered? No  Type of order/referral and detailed reason for visit: Patient needs home heath services   Preference of office, provider, location: [facility]   If referral order, have you been seen by this specialty before? No (If Yes, this issue or another issue? When? Where?  Can we respond through MyChart? Yes

## 2024-10-15 NOTE — Telephone Encounter (Signed)
 Called Dennis w/ [facility] and he stated pt reached out to him to restart PT. Marinda would like to know if Dr. Domenica would place another referral for the pt.

## 2024-10-18 ENCOUNTER — Other Ambulatory Visit: Payer: Self-pay

## 2024-10-18 ENCOUNTER — Ambulatory Visit: Admitting: Orthopedic Surgery

## 2024-10-18 ENCOUNTER — Encounter: Payer: Self-pay | Admitting: Orthopedic Surgery

## 2024-10-18 DIAGNOSIS — M545 Low back pain, unspecified: Secondary | ICD-10-CM

## 2024-10-18 NOTE — Progress Notes (Signed)
 "  Office Visit Note   Patient: Walter Brown           Date of Birth: 02/23/1941           MRN: [id number] Visit Date: 10/18/2024 Requested by: Douglass Kenney NOVAK, FNP [address] [place],  [place] [address] PCP: Domenica Harlene LABOR, MD  Subjective: Chief Complaint  Patient presents with   Left Hip - Pain    HPI: Walter Brown is a 84 y.o. male who presents to the office reporting left hip pain which has been ongoing for several weeks.  No known history of injury.  Patient is able to walk but does not walk too much because of Parkinson's.  He does have a wheelchair and electric scooter for that problem.  Does report some low back pain about 2 inches below his belt line.  Also describes buttock pain but no groin pain.  He uses his walker to get to the mailbox.  Pain is worse with ambulation and worse with standing.  Does take tramadol ..                ROS: All systems reviewed are negative as they relate to the chief complaint within the history of present illness.  Patient denies fevers or chills.  Assessment & Plan: Visit Diagnoses:  1. Low back pain, unspecified back pain laterality, unspecified chronicity, unspecified whether sciatica present     Plan: Impression is likely facet arthritis and possibly spinal stenosis with underlying scoliosis in the lumbar spine.  Left hip radiographs unremarkable and he does not really examine like he has hip arthritis.  Plan is MRI lumbar spine to evaluate left-sided radiculopathy and facet arthritis with likely ESI's to follow.  I will have Cody follow-up with Dr. Eldonna after these scan results come in.  Follow-Up Instructions: No follow-ups on file.   Orders:  Orders Placed This Encounter  Procedures   XR Lumbar Spine 2-3 Views   MR Lumbar Spine w/o contrast   Ambulatory referral to Physical Medicine Rehab   No orders of the defined types were placed in this encounter.     Procedures: No procedures performed   Clinical Data: No  additional findings.  Objective: Vital Signs: There were no vitals taken for this visit.  Physical Exam:  Constitutional: Patient appears well-developed HEENT:  Head: Normocephalic Eyes:EOM are normal Neck: Normal range of motion Cardiovascular: Normal rate Pulmonary/chest: Effort normal Neurologic: Patient is alert Skin: Skin is warm Psychiatric: Patient has normal mood and affect  Ortho Exam: Ortho exam demonstrates no groin pain with internal/external rotation of either leg.  No nerve root tension signs.  Patient has good ankle dorsiflexion plantarflexion quad and hamstring strength.  No definite paresthesias L1-S1 bilaterally.  No definite trochanteric tenderness.  Specialty Comments:  No specialty comments available.  Imaging: XR Lumbar Spine 2-3 Views Result Date: 10/18/2024 AP lateral radiographs lumbar spine reviewed.  Scoliosis is present in the lower thoracic and lumbar spine.  There is significant to space narrowing on the concave side of the curve on the left-hand side around L4-5 and L3-4.  No compression fracture or spondylolisthesis.    PMFS History: Patient Active Problem List   Diagnosis Date Noted   Erectile dysfunction 05/14/2024   (Idiopathic) normal pressure hydrocephalus (HCC) 03/29/2024   Normal pressure hydrocephalus (HCC) 03/29/2024   Bilateral impacted cerumen 03/16/2024   Bilateral hearing loss due to cerumen impaction 03/08/2024   Anemia 03/07/2024   Acute metabolic encephalopathy 03/02/2024  Hypokalemia 03/02/2024   Recurrent falls 12/04/2022   Nocturia 12/04/2022   Hemoptysis 01/17/2022   ETD (Eustachian tube dysfunction), right 01/17/2022   Dysphagia 09/11/2021   Unsteady gait 09/11/2021   Insomnia 02/06/2021   Major depressive disorder 04/25/2020   Hyperglycemia 04/25/2020   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   Coronary artery calcification 11/23/2019   Parkinson's disease (HCC) 05/19/2019    Obstructive sleep apnea 11/09/2018   PAF (paroxysmal atrial fibrillation) 09/09/2018   Palpitations 09/07/2018   ASVD (arteriosclerotic vascular disease) 09/07/2018   Lipoma 08/04/2018   Bradycardia 06/28/2018   Cough 06/23/2018   TIA (transient ischemic attack) 03/10/2018   Hypertension 09/16/2017   Arthritis 04/06/2017   Macular degeneration 03/03/2015   Back pain with radiation 04/26/2013   Preventative health care 08/01/2012   Hyperlipidemia    BPH (benign prostatic hyperplasia)    Basal cell carcinoma    Thrombocytopenia    Past Medical History:  Diagnosis Date   A-fib (HCC)    Allergy 10/07/1944   Anemia 02/07/2018   mild   Arthritis 04/06/2017   Asthma    childhood   ASVD (arteriosclerotic vascular disease) 09/07/2018   Back pain with radiation 04/26/2013   Low back with LLE radiculopathy   Basal cell carcinoma    skin- on nose- basal cell (20 yrs ago) forehead 1 year ago     Sees Dr Darice Reusing of dermatology   BPH (benign prostatic hyperplasia)    Bradycardia 06/28/2018   Cataract 10/07/2014   Cerumen impaction 08/10/2012   Chicken pox as child   Coronary artery calcification 11/23/2019   Dysphagia 09/11/2021   Elevated LFTs 11/2017   suspected drug/antibiotic induced   Essential hypertension    ETD (Eustachian tube dysfunction), right 01/17/2022   Generalized anxiety disorder    GERD (gastroesophageal reflux disease)    German measles as a child   Hemoptysis 01/17/2022   Hyperglycemia 04/25/2020   Hyperlipidemia    Insomnia 02/06/2021   Internal hemorrhoids    Irregular cardiac rhythm 03/10/2018   Lipoma 08/04/2018   Macular degeneration    Major depressive disorder 08/01/2012   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   Nocturia 12/04/2022   Obstructive sleep apnea 11/09/2018   Patient reports mild symptoms; was not prescribed a CPAP machine   Otitis externa 08/10/2012   PAF (paroxysmal atrial fibrillation)     Palpitations 09/07/2018   Parkinson's disease (HCC) 05/19/2019   Recurrent falls 12/04/2022   Stroke (HCC) 02/27/2018   Thrombocytopenia    TIA (transient ischemic attack) 03/10/2018   Unsteady gait 09/11/2021    Family History  Problem Relation Age of Onset   Hypertension Mother    Other Mother        MRSA   Heart disease Mother        stents   ADD / ADHD Mother    Arthritis Mother    Asthma Mother    Depression Mother    Vision loss Mother    Heart disease Father    Hypertension Father    Prostate cancer Father 24   Parkinson's disease Father    Cancer Father 44   Hearing loss Father    Stroke Father    Vision loss Father    Varicose Veins Father    Stroke Maternal Grandmother    Prostate cancer Maternal Grandfather    Asthma Maternal Grandfather    Cancer Maternal Grandfather    Cancer Paternal Grandfather  EYE   Hearing loss Paternal Grandfather    ADD / ADHD Son        ADHD   Other Son 24       part of 1 lung removed- due to infection   Alcohol abuse Son    Depression Son    Alcohol abuse Son    Alcohol abuse Maternal Uncle    Depression Maternal Uncle    Asthma Maternal Uncle    Heart disease Paternal Uncle    Colon cancer Neg Hx    Esophageal cancer Neg Hx    Rectal cancer Neg Hx    Stomach cancer Neg Hx     Past Surgical History:  Procedure Laterality Date   BELPHAROPTOSIS REPAIR     very young, b/l   EXCISIONAL HEMORRHOIDECTOMY     EYE SURGERY  2017   eyelids   HYDROCELE EXCISION / REPAIR  2012   b/l   SHUNT REVISION VENTRICULAR-PERITONEAL Right 03/31/2024   Procedure: REVISION, SHUNT, VENTRICULOPERITONEAL;  Surgeon: Mavis Purchase, MD;  Location: [facility] OR;  Service: Neurosurgery;  Laterality: Right;  SHUNT REVISION   SKIN CANCER EXCISION     nose and forehead, basal cell CA   UPPER GASTROINTESTINAL ENDOSCOPY     VENTRICULOPERITONEAL SHUNT Right 03/29/2024   Procedure: SHUNT INSERTION VENTRICULAR-PERITONEAL;  Surgeon: Mavis Purchase,  MD;  Location: [facility] OR;  Service: Neurosurgery;  Laterality: Right;  RT VP SHUNT PLACEMENT   Social History   Occupational History   Occupation: Retired  Tobacco Use   Smoking status: Former    Current packs/day: 0.00    Average packs/day: 1.5 packs/day for 15.0 years (22.5 ttl pk-yrs)    Types: Cigarettes    Start date: 10/06/1978    Quit date: 10/07/1978    Years since quitting: 46.0   Smokeless tobacco: Never   Tobacco comments:    QUIT 1980  Vaping Use   Vaping status: Never Used  Substance and Sexual Activity   Alcohol use: Yes    Alcohol/week: 7.0 standard drinks of alcohol    Types: 7 Glasses of wine per week    Comment: wine with meals   Drug use: Not Currently   Sexual activity: Yes    Comment: lives with wife, retired from chief strategy officer in [employer] , 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        "

## 2024-10-21 ENCOUNTER — Ambulatory Visit: Payer: Self-pay | Admitting: Psychology

## 2024-10-21 ENCOUNTER — Other Ambulatory Visit (HOSPITAL_BASED_OUTPATIENT_CLINIC_OR_DEPARTMENT_OTHER): Payer: Self-pay | Admitting: Cardiovascular Disease

## 2024-10-21 ENCOUNTER — Other Ambulatory Visit: Payer: Self-pay | Admitting: Physician Assistant

## 2024-10-21 ENCOUNTER — Other Ambulatory Visit: Payer: Self-pay | Admitting: Family Medicine

## 2024-10-21 ENCOUNTER — Encounter: Payer: Self-pay | Admitting: Psychology

## 2024-10-21 ENCOUNTER — Institutional Professional Consult (permissible substitution): Admitting: Psychology

## 2024-10-21 DIAGNOSIS — G20A1 Parkinson's disease without dyskinesia, without mention of fluctuations: Secondary | ICD-10-CM | POA: Diagnosis not present

## 2024-10-21 DIAGNOSIS — F411 Generalized anxiety disorder: Secondary | ICD-10-CM

## 2024-10-21 DIAGNOSIS — F0670 Mild neurocognitive disorder due to known physiological condition without behavioral disturbance: Secondary | ICD-10-CM

## 2024-10-21 DIAGNOSIS — R4189 Other symptoms and signs involving cognitive functions and awareness: Secondary | ICD-10-CM

## 2024-10-21 DIAGNOSIS — F330 Major depressive disorder, recurrent, mild: Secondary | ICD-10-CM

## 2024-10-21 DIAGNOSIS — F02A3 Dementia in other diseases classified elsewhere, mild, with mood disturbance: Secondary | ICD-10-CM | POA: Diagnosis not present

## 2024-10-21 HISTORY — DX: Parkinson's disease without dyskinesia, without mention of fluctuations: G20.A1

## 2024-10-21 NOTE — Progress Notes (Signed)
" ° °  Psychometrician Note   Cognitive testing was administered to Walter Brown by Lonell Jude, B.S. (psychometrist) under the supervision of Dr. Arthea KYM Maryland, Ph.D., ABPP, licensed psychologist on 10/21/2024. Walter Brown did not appear overtly distressed by the testing session per behavioral observation or responses across self-report questionnaires. Rest breaks were offered.   The battery of tests administered was selected by Dr. Arthea KYM Maryland, Ph.D., ABPP with consideration to Walter Brown current level of functioning, the nature of his symptoms, emotional and behavioral responses during interview, level of literacy, observed level of motivation/effort, and the nature of the referral question. This battery was communicated to the psychometrist. Communication between Dr. Arthea KYM Maryland, Ph.D., ABPP and the psychometrist was ongoing throughout the evaluation and Dr. Arthea KYM Maryland, Ph.D., ABPP was immediately accessible at all times. Dr. Zachary C. Merz, Ph.D., ABPP provided supervision to the psychometrist on the date of this service to the extent necessary to assure the quality of all services provided.    Walter Brown will return within approximately 1-2 weeks for an interactive feedback session with Dr. Maryland at which time his test performances, clinical impressions, and treatment recommendations will be reviewed in detail. Walter Brown understands he can contact our office should he require our assistance before this time.  A total of 130 minutes of billable time were spent face-to-face with Walter Brown by the psychometrist. This includes both test administration and scoring time. Billing for these services is reflected in the clinical report generated by Dr. Arthea KYM Maryland, Ph.D., ABPP  This note reflects time spent with the psychometrician and does not include test scores or any clinical interpretations made by Dr. Maryland. The full report will follow in a separate note. "

## 2024-10-21 NOTE — Progress Notes (Unsigned)
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 developmental delay: Denied. History of grade repetition: Denied. Enrollment in special education courses: Denied. History of LD/ADHD: Denied.   Employment: Retired. 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   Evaluation Results:   Behavioral Observations: Mr. Mcneese was accompanied by his wife, arrived to his appointment on time, and was appropriately dressed and groomed. He appeared alert. He was pushed in a wheelchair by his wife to the examination room. As such, gait and station were unable to be assessed. A mild resting tremor was observed, primarily in his right leg. Minimal resting tremors were observed in his upper extremities. His affect was generally relaxed and positive, but did range appropriately given the subject being discussed during interview. Spontaneous speech was fluent and word  finding difficulties were not observed during the clinical interview. Thought processes were coherent, organized, and normal in content. Insight into his cognitive difficulties appeared fairly adequate. Processing speed is likely more impaired than he is able to fully perceive.  During testing, sustained attention was appropriate. Task engagement was adequate and he persisted when challenged. Overall, Mr. Kerstetter was cooperative with the clinical interview and subsequent testing procedures.   Adequacy of Effort: The validity of neuropsychological testing is limited by the extent to which the individual being tested may be assumed to have exerted adequate effort during testing. Mr. Comunale expressed his intention to perform to the best of his abilities and exhibited adequate task engagement and persistence.  Scores across stand-alone and embedded performance validity measures were variable. However, his sole below expectation performance is believed to be due to true cognitive dysfunction rather than poor engagement or any attempts to perform poorly. As such, the results of the current evaluation are believed to be a valid representation of Mr. Senat current cognitive functioning.  Test Results: Mr. Dinovo was mildly disoriented at the time of the current evaluation. He incorrectly stated his age (66) and was unable to state the current date.  Intellectual abilities based upon educational and vocational attainment were estimated to be in the average range. Premorbid abilities were estimated to be within the average range based upon a single-word reading test.   Processing speed was exceptionally low outside of an isolated intact performance across a rapid word reading task. Basic attention was below average. More complex attention (e.g., working memory) was also below average. Executive functioning was exceptionally low.  While not directly assessed, receptive language abilities were believed to be  intact. Mr. Fortin did not exhibit any difficulties comprehending task instructions and answered all questions asked of him appropriately. Assessed expressive language (e.g., verbal fluency and confrontation naming) was largely average to above average. He did exhibit an isolated weakness across one of two semantic fluency tasks.     Assessed visuospatial/visuoconstructional abilities were variable, ranging from the exceptionally low to average normative ranges. Points were lost across copy of a complex figure due to some fairly prominent visual distortions, as well as one internal aspect being fully omitted.    Learning (i.e., encoding) of novel information was exceptionally low across a list learning task but average across a story-based task. Spontaneous delayed recall (i.e., retrieval) of previously learned information was below average to average. Retention rates were 25% (raw score of 1) across a list learning task, 86% across a story learning task, and 75% across a figure drawing task. Performance across recognition tasks was commensurate with performances across learning and retrieval tasks, suggesting evidence for information consolidation.   Results of emotional screening instruments suggested that recent symptoms of generalized anxiety were in the moderate range, while symptoms of depression were within the mild range. A screening instrument assessing recent sleep quality suggested the presence of minimal sleep dysfunction.  Table of Scores:   Note: This summary of test scores accompanies the interpretive report and should not be considered in isolation without reference to the appropriate sections in the text. Descriptors are based on appropriate normative data and may be adjusted based on clinical judgment. Terms such as Within Normal Limits and Outside Normal Limits are used when a more specific description of the test score cannot be determined. Descriptors refer to the current  evaluation only.        Percentile - Normative Descriptor > 98 - Exceptionally High 91-97 - Well Above Average 75-90 - Above Average 25-74 - Average 9-24 - Below Average 2-8 - Well Below Average < 2 - Exceptionally Low        Validity: March 2024 Current  DESCRIPTOR        DCT: --- --- --- Outside Normal Limits  RBANS EI: --- --- --- Within Normal Limits        Orientation:       Raw Score Raw Score Percentile   NAB Orientation, Form 1 26/29 26/29 --- ---        Cognitive Screening:       Raw Score Raw Score Percentile   SLUMS: 21/30 17/30 --- ---  RBANS, Form A: Standard Score/ Scaled Score Standard Score/ Scaled Score Percentile   Total Score 88 72 3 Well Below Average  Immediate Memory 90 76 5 Well Below Average    List Learning 8 3 1  Exceptionally Low    Story Memory 9 8 25  Average  Visuospatial/Constructional 78 69 2 Well Below Average    Figure Copy 6 3 1  Exceptionally Low    Line Orientation 13/20 13/20 10-16 Below Average  Language 89 86 18 Below Average    Picture Naming 10/10 10/10 >75 Above Average    Semantic Fluency 5 4 2  Well Below Average  Attention 100 82 12 Below Average    Digit Span 14 13 84 Above Average    Coding 6 1 <1 Exceptionally Low  Delayed Memory 104 75 5 Well Below Average    List Recall 4/10 1/10 10-16 Below Average    List Recognition 20/20 16/20 3-9 Well Below Average    Story Recall 7 8 25  Average    Story Recognition 10/12 10/12 53-67 Average    Figure Recall 11 7 16  Below Average    Figure Recognition 5/8 6/8 58-68 Average        Intellectual Functioning:       Standard Score Standard Score Percentile   Test of Premorbid Functioning: 92 91 27 Average        Attention/Executive Function:      Trail Making Test (TMT): Raw Score (T Score) Raw Score (T Score) Percentile     Part A 56 secs.,  0 errors (37) 80 secs.,  0 errors (29) 2 Exceptionally Low    Part B 196 secs.,  4 errors (39) 299 secs.,  4 errors (25) 1  Exceptionally Low         Symbol Digit Modalities Test (SDMT): Raw Score (Z-Score) Raw Score (Z-Score) Percentile     Oral 24 (-2.13) 18 (-2.57) 1 Exceptionally Low        NAB Attention Module, Form 1: T Score T Score Percentile     Digits Forward 55 41 18 Below Average    Digits Backwards 34 41 18 Below Average        D-KEFS Color-Word Interference Test: Raw Score (Scaled Score) Raw Score (Scaled Score) Percentile     Color Naming 58 secs. (2) 71 secs. (1) <1 Exceptionally Low    Word Reading 33 secs. (7) 31 secs. (8) 25 Average    Inhibition 138 secs. (5) Discontinued --- Impaired      Total Errors 13 errors (3) --- --- ---    Inhibition/Switching 179 secs. (1) Not Attempted --- ---      Total Errors 8 errors (6) --- --- ---        D-KEFS 20 Questions Test: Scaled Score Scaled Score Percentile     Total Weighted Achievement Score 8 Discontinued --- Impaired    Initial Abstraction Score 14 --- --- ---        Language:      Verbal Fluency Test: Raw Score (Scaled Score) Raw Score (Scaled Score) Percentile     Phonemic Fluency (CFL) 24 (8) 37 (11) 63 Average    Category Fluency 32 (9) 33 (9) 37 Average  *Based on [employer] Older Normative Studies (MOANS)            NAB Language Module, Form 1: T Score T Score Percentile     Naming 31/31 (63) 30/31 (62) 88 Above Average        Visuospatial/Visuoconstruction:  Raw Score Raw Score Percentile   Clock Drawing: 4/10 9/10 --- Within Normal Limits        NAB Spatial Module, Form 1: T Score T Score Percentile     Visual Discrimination 43 29 2 Well Below Average         Scaled Score Scaled Score Percentile   WAIS-IV Block Design: 3 --- --- ---  WAIS-IV Matrix Reasoning: 8 9 37 Average        Mood and Personality:       Raw Score Raw Score Percentile   Geriatric Depression Scale: 10 17 --- Mild  Geriatric Anxiety Scale: 11 25 --- Moderate    Somatic 2 11 --- Moderate    Cognitive 5 6 --- Mild    Affective 4 8 --- Moderate         Additional Questionnaires:       Raw Score Raw Score Percentile   PROMIS Sleep Disturbance Questionnaire: 17 21 --- None to Slight   Informed Consent and Coding/Compliance:   The current evaluation represents a clinical evaluation for the purposes previously outlined by the referral source and is in no way reflective of a forensic evaluation.   Mr. Corsino was provided with a verbal description of the nature and purpose of the present neuropsychological evaluation. Also reviewed were the foreseeable risks and/or discomforts and benefits of the procedure, limits of confidentiality, and mandatory reporting requirements of this provider. The patient was given the opportunity to ask questions and receive answers about the evaluation. Oral consent to participate was provided by the patient.   This evaluation was conducted by Arthea KYM Maryland, Ph.D., ABPP-CN, board certified clinical neuropsychologist. Mr. Gabrielson completed a clinical interview with Dr. Maryland, billed as one unit [phone], and 130 minutes of cognitive testing and scoring, billed as one unit [phone] and three additional units [id number]. Psychometrist Lonell Jude, B.S. assisted Dr. Maryland with test administration and scoring procedures. As a separate and discrete service, one unit [id number] and two units 96133 (172 minutes) were billed for Dr. Loralee time spent in interpretation and report writing.      "

## 2024-10-21 NOTE — Telephone Encounter (Signed)
 In accordance with refill protocols, please review and address the following requirements before this medication refill can be authorized:  Chest X-ray, EKG , Labs , and Other testing

## 2024-10-26 ENCOUNTER — Other Ambulatory Visit: Payer: Self-pay | Admitting: Family

## 2024-10-26 ENCOUNTER — Encounter: Payer: Self-pay | Admitting: Orthopedic Surgery

## 2024-10-28 ENCOUNTER — Encounter: Admitting: Psychology

## 2024-10-28 DIAGNOSIS — F330 Major depressive disorder, recurrent, mild: Secondary | ICD-10-CM

## 2024-10-28 DIAGNOSIS — G20A1 Parkinson's disease without dyskinesia, without mention of fluctuations: Secondary | ICD-10-CM

## 2024-10-28 NOTE — Progress Notes (Signed)
"  ° °  Neuropsychology Feedback Session Jolynn DEL. [facility] Department of Neurology  Reason for Referral:   Walter Brown is a 84 y.o. right-handed Caucasian male referred by Asberry Schneider, D.O., to characterize his current cognitive functioning and assist with diagnostic clarity and treatment planning in the context of Parkinson's disease and concerns for progressive cognitive decline.   Feedback:   Walter Brown completed a comprehensive neuropsychological evaluation on 10/21/2024. Please refer to that encounter for the full report and recommendations. Briefly, results suggested impairment surrounding processing speed and executive functioning. Further performance variability was exhibited across visuospatial abilities. While an isolated impairment was exhibited across a list-based memory task, story and figure-based tasks were appropriate. Performances were also appropriate relative to age-matched peers across attention/concentration, receptive language, and expressive language. The most likely cause for his mild dementia presentation continues to be his history of Parkinson's disease. Current testing patterns align well with expectations for this illness, as do areas of exhibited cognitive decline over the past 22 months. Decline in these areas could also be influenced by psychiatric distress (i.e., anxiety and depression), which has notably increased in intensity relative to previous testing per mood-related questionnaires (which is consistent with his reporting). Current memory patterns are not worrisome for Alzheimer's disease.   Walter Brown was accompanied by his wife during the current feedback session. Content of the current session focused on the results of his neuropsychological evaluation. Walter Brown was given the opportunity to ask questions and his questions were answered. He specified a desire for a referral for psychotherapy to address adjustment issues stemming from  functional decline, as well as psychiatric distress more broadly. I informed him I would place a referral with [facility] to see if they have any openings. He was encouraged to reach out should additional questions arise. A copy of his report was provided at the conclusion of the visit.      One unit 96132 (31 minutes) was billed for Dr. Loralee time spent preparing for, conducting, and documenting the current feedback session with Walter Brown. "
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
## 2024-11-01 ENCOUNTER — Ambulatory Visit: Admitting: Physician Assistant

## 2024-11-02 ENCOUNTER — Telehealth: Payer: Self-pay

## 2024-11-02 ENCOUNTER — Telehealth

## 2024-11-02 VITALS — BP 133/87 | HR 66 | Ht 65.0 in | Wt 155.0 lb

## 2024-11-02 DIAGNOSIS — F330 Major depressive disorder, recurrent, mild: Secondary | ICD-10-CM

## 2024-11-02 NOTE — Patient Outreach (Signed)
 Complex Care Management   Visit Note  11/02/2024  Name:  Walter Brown MRN: [id number] DOB: 1941-04-15  Situation: Referral received for Complex Care Management related to Parkinsons I obtained verbal consent from Patient.  Visit completed with Caregiver  on the phone  Background:   Past Medical History:  Diagnosis Date   Acute metabolic encephalopathy 03/02/2024   Allergy 10/07/1944   Anemia 02/07/2018   mild   Arthritis 04/06/2017   Asthma    childhood   ASVD (arteriosclerotic vascular disease) 09/07/2018   Back pain with radiation 04/26/2013   Low back with LLE radiculopathy   Basal cell carcinoma    skin- on nose- basal cell (20 yrs ago) forehead 1 year ago     Sees Dr Darice Reusing of dermatology   Bilateral impacted cerumen 03/16/2024   BPH (benign prostatic hyperplasia)    Bradycardia 06/28/2018   Cataract 10/07/2014   Cerumen impaction 08/10/2012   Chicken pox as child   Coronary artery calcification 11/23/2019   Dysphagia 09/11/2021   Elevated LFTs 11/2017   suspected drug/antibiotic induced   Erectile dysfunction 05/14/2024   Essential hypertension    ETD (Eustachian tube dysfunction), right 01/17/2022   Generalized anxiety disorder    GERD (gastroesophageal reflux disease)    German measles as a child   Hemoptysis 01/17/2022   Hyperglycemia 04/25/2020   Hyperlipidemia    Hypokalemia 03/02/2024   Insomnia 02/06/2021   Internal hemorrhoids    Irregular cardiac rhythm 03/10/2018   Lipoma 08/04/2018   Macular degeneration    Major depressive disorder 08/01/2012   Mild dementia due to Parkinson's disease 10/21/2024   Nocturia 12/04/2022   Normal pressure hydrocephalus 03/29/2024   shunting 03/2024   Obstructive sleep apnea 11/09/2018   Patient reports mild symptoms; was not prescribed a CPAP machine   Otitis externa 08/10/2012   PAF (paroxysmal atrial fibrillation)    Palpitations 09/07/2018   Parkinson's disease 05/19/2019   Recurrent falls 12/04/2022    Stroke 04/2023   Punctate focus of restricted diffusion in the mesial left temporal lobe which could reflect an acute/subacute infarct   Thrombocytopenia    TIA (transient ischemic attack) 03/10/2018   Unsteady gait 09/11/2021    Assessment: Patient Reported Symptoms:  Cognitive Cognitive Status: Difficulties with attention and concentration, Confused or disoriented, Requires Assistance Decision Making, Struggling with memory recall Cognitive/Intellectual Conditions Management [RPT]: Other Other: Parkinson's Dementia   Health Maintenance Behaviors: Sleep adequate, Exercise, Annual physical exam Healing Pattern: Slow Health Facilitated by: Rest  Neurological Neurological Review of Symptoms: No symptoms reported [place] Neurological Symptoms/Conditions [RPT]: Parkinson's/ Dementia Neurological Management Strategies: Adequate rest, Coping strategies, Routine screening Neurological Self-Management Outcome: 4 (good)  HEENT HEENT Symptoms Reported: No symptoms reported HEENT Management Strategies: Medication therapy, Routine screening, Coping strategies HEENT Self-Management Outcome: 4 (good) HEENT Comment: HOH even with hhearing aides- sometimes responds incorrectly due to misunderstanding words spoken/heard Vision problem(s)  Cardiovascular Cardiovascular Symptoms Reported: No symptoms reported Does patient have uncontrolled Hypertension?: No Cardiovascular Management Strategies: Adequate rest, Coping strategies, Routine screening Weight: 155 lb (70.3 kg) Cardiovascular Self-Management Outcome: 3 (uncertain) Cardiovascular Comment: AFIB  Respiratory Respiratory Symptoms Reported: Dry cough, Productive cough Other Respiratory Symptoms: Sinus drainage  Wife states he has had it forever Respiratory Management Strategies: Adequate rest, Medication therapy, Coping strategies, Routine screening Respiratory Self-Management Outcome: 4 (good)  Endocrine Endocrine Symptoms Reported: No symptoms  reported Is patient diabetic?: No Is patient checking blood sugars at home?: No Endocrine Self-Management Outcome: 4 (good)  Gastrointestinal  Gastrointestinal Symptoms Reported: Incontinence Gastrointestinal Management Strategies: Incontinence garment/pad Gastrointestinal Self-Management Outcome: 4 (good)    Genitourinary Genitourinary Symptoms Reported: Incontinence Genitourinary Management Strategies: Coping strategies, Incontinence garment/pad Genitourinary Self-Management Outcome: 4 (good)  Integumentary Integumentary Symptoms Reported: Bruising Additional Integumentary Details: Wife states bruises easily Skin Management Strategies: Coping strategies, Routine screening Skin Self-Management Outcome: 4 (good)  Musculoskeletal Musculoskelatal Symptoms Reviewed: Difficulty walking, Limited mobility, Unsteady gait Additional Musculoskeletal Details: Walker/ Scooter/ PT 2X weekly [facility] Musculoskeletal Management Strategies: Adequate rest, Coping strategies, Medical device Musculoskeletal Self-Management Outcome: 4 (good) Falls in the past year?: Yes Number of falls in past year: 1 or less Was there an injury with Fall?: No Fall Risk Category Calculator: 1 Patient Fall Risk Level: Low Fall Risk Patient at Risk for Falls Due to: History of fall(s) Fall risk Follow up: Falls evaluation completed, Education provided, Falls prevention discussed  Psychosocial Psychosocial Symptoms Reported: Depression - if selected complete PHQ 2-9 Behavioral Management Strategies: Medication therapy, Adequate rest Behavioral Health Self-Management Outcome: 4 (good) Major Change/Loss/Stressor/Fears (CP): Medical condition, self Behaviors When Feeling Stressed/Fearful: Also due to my age  I try to walk and do leg exercises Techniques to Cope with Loss/Stress/Change: Diversional activities Quality of Family Relationships: helpful, involved, supportive Do you feel physically threatened by others?: No     11/02/2024    PHQ2-9 Depression Screening   Little interest or pleasure in doing things Not at all  Feeling down, depressed, or hopeless Several days  PHQ-2 - Total Score 1  Trouble falling or staying asleep, or sleeping too much    Feeling tired or having little energy    Poor appetite or overeating     Feeling bad about yourself - or that you are a failure or have let yourself or your family down    Trouble concentrating on things, such as reading the newspaper or watching television    Moving or speaking so slowly that other people could have noticed.  Or the opposite - being so fidgety or restless that you have been moving around a lot more than usual    Thoughts that you would be better off dead, or hurting yourself in some way    PHQ2-9 Total Score    If you checked off any problems, how difficult have these problems made it for you to do your work, take care of things at home, or get along with other people    Depression Interventions/Treatment      Today's Vitals   11/02/24 1114  BP: 133/87  Pulse: 66  Weight: 155 lb (70.3 kg)  Height: 5' 5 (1.651 m)   Pain Scale: 0-10 Pain Score: 5  Pain Location: Hip Pain Orientation: Left Pain Descriptors / Indicators: Sharp Pain Onset: Sudden (Hurts sudenly when he takes a step or at night in bed) Patients Stated Pain Goal: 0 Pain Intervention(s): Hot/Cold interventions, Medication (See eMAR)  Medications Reviewed Today     Reviewed by Kay Hendricks MATSU, RN (Case Manager) on 11/02/24 at 1109  Med List Status: <None>   Medication Order Taking? Sig Documenting Provider Last Dose Status Informant  acetaminophen  (TYLENOL ) 500 MG tablet [id number] Yes Take 2 tablets (1,000 mg total) by mouth every 6 (six) hours as needed for moderate pain (pain score 4-6). Nivia Colon, PA-C  Active Spouse/Significant Other, Care Giver  amiodarone  (PACERONE ) 200 MG tablet [id number] Yes TAKE 1/2 TABLET BY MOUTH DAILY Court Dorn PARAS, MD  Active    benzonatate  (TESSALON ) 100 MG capsule [id number] Yes TAKE 1-2  CAPSULES (100-200 MG TOTAL) BY MOUTH 2 (TWO) TIMES DAILY AS NEEDED FOR COUGH. Webb, Padonda B, FNP  Active   carbidopa -levodopa  (SINEMET  CR) 50-200 MG tablet [id number] Yes Take 1 tablet by mouth at bedtime. Evonnie Asberry RAMAN, DO  Active   carbidopa -levodopa  (SINEMET  IR) 25-100 MG tablet [id number] Yes TAKE 2 TABLETS BY MOUTH AT 7AM, 2 TABS AT 11AM, 1 TAB AT 3PM, AND 1 TAB AT 7 PM Tat, Rebecca S, DO  Active   cholecalciferol (VITAMIN D3) 25 MCG (1000 UNIT) tablet [id number] Yes Take 2,000 Units by mouth daily. [provider]  Active Spouse/Significant Other, Care Giver  divalproex  (DEPAKOTE ) 125 MG DR tablet [id number] Yes TAKE 1 TABLET (125 MG TOTAL) BY MOUTH AT BEDTIME Wertman, Sara E, PA-C  Active   ezetimibe  (ZETIA ) 10 MG tablet [id number] Yes TAKE 1 TABLET BY MOUTH EVERY DAY Court Dorn PARAS, MD  Active   GEMTESA 75 MG TABS [id number] Yes Take 1 tablet by mouth daily. [provider]  Active   guaifenesin  (ROBITUSSIN) 100 MG/5ML syrup [id number] Yes Take 10 mLs (200 mg total) by mouth 3 (three) times daily as needed for cough. Paz, Jose E, MD  Active Spouse/Significant Other, Care Giver  hydrochlorothiazide  (HYDRODIURIL ) 25 MG tablet [id number] Yes TAKE 1 TABLET BY MOUTH EVERY DAY Domenica Harlene LABOR, MD  Active   levalbuterol  (XOPENEX  HFA) 45 MCG/ACT inhaler [id number] Yes INHALE 1 TO 2 PUFFS BY MOUTH EVERY 6 HOURS AS NEEDED FOR WHEEZE Domenica Harlene LABOR, MD  Active Spouse/Significant Other, Care Giver  Melatonin 10 MG TABS [id number] Yes Take 10 mg by mouth daily as needed. [provider]  Active Spouse/Significant Other, Care Giver  Multiple Vitamins-Minerals (ICAPS AREDS 2 PO) [id number] Yes Take 1 capsule by mouth in the morning and at bedtime. [provider]  Active Spouse/Significant Other, Care Giver  pantoprazole  (PROTONIX ) 40 MG tablet [id number] Yes Take 1 tablet (40 mg total) by mouth daily as needed.  Daryl Setter, NP  Active Spouse/Significant Other, Care Giver  polyethylene glycol (MIRALAX  / GLYCOLAX ) 17 g packet [id number] Yes Take 17 g by mouth daily as needed for mild constipation. Bergman, Meghan D, NP  Active   senna (SENOKOT) 8.6 MG TABS tablet [id number] Yes Take 1 tablet by mouth in the morning and at bedtime. [provider]  Active Spouse/Significant Other, Care Giver  sertraline  (ZOLOFT ) 100 MG tablet [id number] Yes TAKE 1 TABLET BY MOUTH EVERY DAY Domenica Harlene LABOR, MD  Active   SYMBICORT  160-4.5 MCG/ACT inhaler [id number] Yes INHALE 2 PUFFS INTO THE LUNGS TWICE A DAY Webb, Padonda B, FNP  Active   Vitamin D , Ergocalciferol , (DRISDOL ) 1.25 MG (50000 UNIT) CAPS capsule [id number] Yes Take 1 capsule (50,000 Units total) by mouth every 7 (seven) days. Domenica Harlene LABOR, MD  Active             Recommendation:   Continue Current Plan of Care  Follow Up Plan:   Telephone follow-up in 1 month  Hendricks Her RN, BSN  [place] [facility] RN Case Manager   Direct [phone]

## 2024-11-02 NOTE — Patient Instructions (Signed)
 Visit Information  Thank you for taking time to visit with me today. Please don't hesitate to contact me if I can be of assistance to you before our next scheduled appointment.  Your next care management appointment is by telephone on 11/30/2024 at 11:00 AM   Telephone follow-up in 1 month  Please call the care guide team at [phone] if you need to cancel, schedule, or reschedule an appointment.   Please call the Suicide and Crisis Lifeline: 988 call the [place]  National Suicide Prevention Lifeline: (204)054-2316 or TTY: 225-231-6007 TTY (762)316-8832) to talk to a trained counselor call 1-800-273-TALK (toll free, 24 hour hotline) go to [facility] [address] [place] [phone]) call 911 if you are experiencing a Mental Health or Behavioral Health Crisis or need someone to talk to.  Hendricks Her RN, BSN   I [facility] RN Case Information Systems Manager [phone]

## 2024-11-02 NOTE — Progress Notes (Signed)
 Complex Care Management Note  Care Guide Note 11/02/2024 Name: Walter Brown MRN: [id number] DOB: 09/18/1941  Walter Brown is a 84 y.o. year old male who sees Domenica Harlene LABOR, MD for primary care. I reached out to [name] by phone today to offer complex care management services.  Mr. Stoutenburg was given information about Complex Care Management services today including:   The Complex Care Management services include support from the care team which includes your Nurse Care Manager, Clinical Social Worker, or Pharmacist.  The Complex Care Management team is here to help remove barriers to the health concerns and goals most important to you. Complex Care Management services are voluntary, and the patient may decline or stop services at any time by request to their care team member.   Complex Care Management Consent Status: Patient agreed to services and verbal consent obtained.   Follow up plan:  Telephone appointment with complex care management team member scheduled for:  11/22/24 at 2:00 p.m.   Encounter Outcome:  Patient Scheduled  Dreama [facility]  [facility], [facility]BCI Assistant Direct Dial: [phone]  Fax: [phone]

## 2024-11-03 ENCOUNTER — Ambulatory Visit: Admitting: Cardiovascular Disease

## 2024-11-05 ENCOUNTER — Other Ambulatory Visit

## 2024-11-09 ENCOUNTER — Ambulatory Visit: Admitting: Cardiovascular Disease

## 2024-11-09 ENCOUNTER — Encounter: Payer: Self-pay | Admitting: Cardiovascular Disease

## 2024-11-09 VITALS — BP 142/84 | HR 82 | Ht 65.0 in | Wt 152.0 lb

## 2024-11-09 DIAGNOSIS — I480 Paroxysmal atrial fibrillation: Secondary | ICD-10-CM | POA: Diagnosis not present

## 2024-11-09 DIAGNOSIS — I10 Essential (primary) hypertension: Secondary | ICD-10-CM | POA: Diagnosis not present

## 2024-11-09 DIAGNOSIS — I2510 Atherosclerotic heart disease of native coronary artery without angina pectoris: Secondary | ICD-10-CM

## 2024-11-09 DIAGNOSIS — E782 Mixed hyperlipidemia: Secondary | ICD-10-CM | POA: Diagnosis not present

## 2024-11-09 NOTE — Assessment & Plan Note (Signed)
 History of PAF maintaining sinus rhythm currently not on Eliquis  because of fall risk.  This was a joint decision.

## 2024-11-09 NOTE — Assessment & Plan Note (Signed)
 History of hyperlipidemia on Zetia  with lipid profile performed 09/23/2024 revealing total cholesterol 159, LDL 74 and HDL 68.

## 2024-11-09 NOTE — Patient Instructions (Signed)
 Medication Instructions:  Your physician recommends that you continue on your current medications as directed. Please refer to the Current Medication list given to you today.  *If you need a refill on your cardiac medications before your next appointment, please call your pharmacy*   Follow-Up: At [facility], you and your health needs are our priority.  As part of our continuing mission to provide you with exceptional heart care, our providers are all part of one team.  This team includes your primary Cardiologist (physician) and Advanced Practice Providers or APPs (Physician Assistants and Nurse Practitioners) who all work together to provide you with the care you need, when you need it.  Your next appointment:   12 month(s)  Provider:   Lauro Portal, MD     We recommend signing up for the patient portal called "MyChart".  Sign up information is provided on this After Visit Summary.  MyChart is used to connect with patients for Virtual Visits (Telemedicine).  Patients are able to view lab/test results, encounter notes, upcoming appointments, etc.  Non-urgent messages can be sent to your provider as well.   To learn more about what you can do with MyChart, go to [email or web address].   Other Instructions

## 2024-11-09 NOTE — Assessment & Plan Note (Signed)
 History of essential hypertension blood pressure measured today at 142/84.  He is on hydrochlorothiazide .

## 2024-11-17 ENCOUNTER — Ambulatory Visit: Admitting: Cardiovascular Disease

## 2024-11-18 ENCOUNTER — Other Ambulatory Visit

## 2024-11-22 ENCOUNTER — Telehealth: Admitting: Licensed Clinical Social Worker

## 2024-11-30 ENCOUNTER — Telehealth

## 2024-12-03 ENCOUNTER — Ambulatory Visit: Admitting: Physician Assistant

## 2025-01-03 ENCOUNTER — Ambulatory Visit: Admitting: Family Medicine

## 2025-01-11 ENCOUNTER — Ambulatory Visit: Admitting: Neurology

## 2025-06-01 ENCOUNTER — Ambulatory Visit
